# Patient Record
Sex: Male | Born: 1970 | Race: White | Hispanic: No | Marital: Single
Health system: Southern US, Community
[De-identification: ages and names within clinical notes are randomized; demographics above are authoritative.]

## PROBLEM LIST (undated history)

## (undated) DIAGNOSIS — G8929 Other chronic pain: Secondary | ICD-10-CM

## (undated) DIAGNOSIS — G43909 Migraine, unspecified, not intractable, without status migrainosus: Secondary | ICD-10-CM

## (undated) DIAGNOSIS — F259 Schizoaffective disorder, unspecified: Secondary | ICD-10-CM

## (undated) DIAGNOSIS — I509 Heart failure, unspecified: Secondary | ICD-10-CM

## (undated) DIAGNOSIS — J45909 Unspecified asthma, uncomplicated: Secondary | ICD-10-CM

## (undated) DIAGNOSIS — F250 Schizoaffective disorder, bipolar type: Secondary | ICD-10-CM

## (undated) DIAGNOSIS — J449 Chronic obstructive pulmonary disease, unspecified: Secondary | ICD-10-CM

## (undated) DIAGNOSIS — M549 Dorsalgia, unspecified: Secondary | ICD-10-CM

## (undated) DIAGNOSIS — M25569 Pain in unspecified knee: Secondary | ICD-10-CM

## (undated) HISTORY — PX: WISDOM TOOTH EXTRACTION: SHX21

## (undated) HISTORY — DX: Heart failure, unspecified: I50.9

## (undated) HISTORY — PX: KNEE SURGERY: SHX244

---

## 1999-05-09 ENCOUNTER — Emergency Department (HOSPITAL_COMMUNITY): Admission: EM | Admit: 1999-05-09 | Discharge: 1999-05-09 | Payer: Self-pay | Admitting: Emergency Medicine

## 1999-05-09 ENCOUNTER — Encounter: Payer: Self-pay | Admitting: Emergency Medicine

## 1999-06-20 ENCOUNTER — Ambulatory Visit (HOSPITAL_COMMUNITY): Admission: RE | Admit: 1999-06-20 | Discharge: 1999-06-20 | Payer: Self-pay | Admitting: Orthopedic Surgery

## 1999-06-20 ENCOUNTER — Encounter: Payer: Self-pay | Admitting: Orthopedic Surgery

## 1999-07-13 ENCOUNTER — Emergency Department (HOSPITAL_COMMUNITY): Admission: EM | Admit: 1999-07-13 | Discharge: 1999-07-13 | Payer: Self-pay | Admitting: Emergency Medicine

## 1999-08-24 ENCOUNTER — Emergency Department (HOSPITAL_COMMUNITY): Admission: EM | Admit: 1999-08-24 | Discharge: 1999-08-24 | Payer: Self-pay | Admitting: Emergency Medicine

## 1999-08-25 ENCOUNTER — Encounter: Payer: Self-pay | Admitting: Emergency Medicine

## 1999-09-08 ENCOUNTER — Emergency Department (HOSPITAL_COMMUNITY): Admission: EM | Admit: 1999-09-08 | Discharge: 1999-09-08 | Payer: Self-pay | Admitting: Emergency Medicine

## 1999-10-31 ENCOUNTER — Emergency Department (HOSPITAL_COMMUNITY): Admission: EM | Admit: 1999-10-31 | Discharge: 1999-10-31 | Payer: Self-pay | Admitting: Emergency Medicine

## 2000-02-16 ENCOUNTER — Emergency Department (HOSPITAL_COMMUNITY): Admission: EM | Admit: 2000-02-16 | Discharge: 2000-02-16 | Payer: Self-pay | Admitting: Emergency Medicine

## 2000-05-06 ENCOUNTER — Emergency Department (HOSPITAL_COMMUNITY): Admission: EM | Admit: 2000-05-06 | Discharge: 2000-05-06 | Payer: Self-pay | Admitting: Emergency Medicine

## 2000-08-16 ENCOUNTER — Inpatient Hospital Stay (HOSPITAL_COMMUNITY): Admission: EM | Admit: 2000-08-16 | Discharge: 2000-08-17 | Payer: Self-pay | Admitting: Emergency Medicine

## 2000-08-17 ENCOUNTER — Inpatient Hospital Stay (HOSPITAL_COMMUNITY): Admission: EM | Admit: 2000-08-17 | Discharge: 2000-08-24 | Payer: Self-pay | Admitting: Psychiatry

## 2000-11-13 ENCOUNTER — Emergency Department (HOSPITAL_COMMUNITY): Admission: EM | Admit: 2000-11-13 | Discharge: 2000-11-14 | Payer: Self-pay | Admitting: Emergency Medicine

## 2000-12-09 ENCOUNTER — Emergency Department (HOSPITAL_COMMUNITY): Admission: EM | Admit: 2000-12-09 | Discharge: 2000-12-09 | Payer: Self-pay | Admitting: Emergency Medicine

## 2000-12-19 ENCOUNTER — Inpatient Hospital Stay (HOSPITAL_COMMUNITY): Admission: EM | Admit: 2000-12-19 | Discharge: 2000-12-20 | Payer: Self-pay | Admitting: Emergency Medicine

## 2000-12-21 ENCOUNTER — Inpatient Hospital Stay (HOSPITAL_COMMUNITY): Admission: EM | Admit: 2000-12-21 | Discharge: 2000-12-23 | Payer: Self-pay | Admitting: *Deleted

## 2001-03-06 ENCOUNTER — Emergency Department (HOSPITAL_COMMUNITY): Admission: EM | Admit: 2001-03-06 | Discharge: 2001-03-06 | Payer: Self-pay | Admitting: Emergency Medicine

## 2001-05-12 ENCOUNTER — Emergency Department (HOSPITAL_COMMUNITY): Admission: EM | Admit: 2001-05-12 | Discharge: 2001-05-12 | Payer: Self-pay | Admitting: Emergency Medicine

## 2002-03-08 ENCOUNTER — Inpatient Hospital Stay (HOSPITAL_COMMUNITY): Admission: EM | Admit: 2002-03-08 | Discharge: 2002-03-14 | Payer: Self-pay | Admitting: Psychiatry

## 2002-03-31 ENCOUNTER — Inpatient Hospital Stay (HOSPITAL_COMMUNITY): Admission: EM | Admit: 2002-03-31 | Discharge: 2002-04-04 | Payer: Self-pay | Admitting: Psychiatry

## 2002-03-31 ENCOUNTER — Inpatient Hospital Stay (HOSPITAL_COMMUNITY): Admission: EM | Admit: 2002-03-31 | Discharge: 2002-03-31 | Payer: Self-pay

## 2002-03-31 ENCOUNTER — Encounter: Payer: Self-pay | Admitting: *Deleted

## 2002-04-09 ENCOUNTER — Emergency Department (HOSPITAL_COMMUNITY): Admission: EM | Admit: 2002-04-09 | Discharge: 2002-04-10 | Payer: Self-pay

## 2002-04-22 ENCOUNTER — Ambulatory Visit (HOSPITAL_BASED_OUTPATIENT_CLINIC_OR_DEPARTMENT_OTHER): Admission: RE | Admit: 2002-04-22 | Discharge: 2002-04-22 | Payer: Self-pay | Admitting: Orthopedic Surgery

## 2002-04-29 ENCOUNTER — Encounter: Admission: RE | Admit: 2002-04-29 | Discharge: 2002-04-29 | Payer: Self-pay | Admitting: Orthopedic Surgery

## 2002-06-04 ENCOUNTER — Encounter: Payer: Self-pay | Admitting: Emergency Medicine

## 2002-06-04 ENCOUNTER — Emergency Department (HOSPITAL_COMMUNITY): Admission: EM | Admit: 2002-06-04 | Discharge: 2002-06-04 | Payer: Self-pay | Admitting: Emergency Medicine

## 2002-06-28 ENCOUNTER — Emergency Department (HOSPITAL_COMMUNITY): Admission: EM | Admit: 2002-06-28 | Discharge: 2002-06-28 | Payer: Self-pay | Admitting: Emergency Medicine

## 2002-07-10 ENCOUNTER — Emergency Department (HOSPITAL_COMMUNITY): Admission: EM | Admit: 2002-07-10 | Discharge: 2002-07-11 | Payer: Self-pay

## 2002-07-14 ENCOUNTER — Emergency Department (HOSPITAL_COMMUNITY): Admission: EM | Admit: 2002-07-14 | Discharge: 2002-07-14 | Payer: Self-pay | Admitting: Emergency Medicine

## 2002-07-26 ENCOUNTER — Inpatient Hospital Stay (HOSPITAL_COMMUNITY): Admission: EM | Admit: 2002-07-26 | Discharge: 2002-08-02 | Payer: Self-pay | Admitting: Psychiatry

## 2002-10-11 ENCOUNTER — Inpatient Hospital Stay (HOSPITAL_COMMUNITY): Admission: EM | Admit: 2002-10-11 | Discharge: 2002-10-27 | Payer: Self-pay | Admitting: Psychiatry

## 2002-10-19 ENCOUNTER — Encounter (HOSPITAL_COMMUNITY): Payer: Self-pay | Admitting: Psychiatry

## 2002-12-15 ENCOUNTER — Ambulatory Visit (HOSPITAL_BASED_OUTPATIENT_CLINIC_OR_DEPARTMENT_OTHER): Admission: RE | Admit: 2002-12-15 | Discharge: 2002-12-15 | Payer: Self-pay | Admitting: Orthopedic Surgery

## 2003-01-09 ENCOUNTER — Encounter: Admission: RE | Admit: 2003-01-09 | Discharge: 2003-01-09 | Payer: Self-pay | Admitting: *Deleted

## 2003-04-23 ENCOUNTER — Inpatient Hospital Stay (HOSPITAL_COMMUNITY): Admission: AD | Admit: 2003-04-23 | Discharge: 2003-04-28 | Payer: Self-pay | Admitting: Psychiatry

## 2003-05-01 ENCOUNTER — Other Ambulatory Visit (HOSPITAL_COMMUNITY): Admission: RE | Admit: 2003-05-01 | Discharge: 2003-05-03 | Payer: Self-pay | Admitting: Psychiatry

## 2003-10-06 ENCOUNTER — Encounter: Admission: RE | Admit: 2003-10-06 | Discharge: 2003-10-06 | Payer: Self-pay | Admitting: Psychiatry

## 2004-02-22 ENCOUNTER — Inpatient Hospital Stay (HOSPITAL_COMMUNITY): Admission: AD | Admit: 2004-02-22 | Discharge: 2004-02-29 | Payer: Self-pay | Admitting: Psychiatry

## 2004-03-15 ENCOUNTER — Inpatient Hospital Stay (HOSPITAL_COMMUNITY): Admission: RE | Admit: 2004-03-15 | Discharge: 2004-03-25 | Payer: Self-pay | Admitting: Psychiatry

## 2004-06-05 ENCOUNTER — Ambulatory Visit (HOSPITAL_COMMUNITY): Payer: Self-pay | Admitting: Psychiatry

## 2004-09-21 ENCOUNTER — Emergency Department (HOSPITAL_COMMUNITY): Admission: EM | Admit: 2004-09-21 | Discharge: 2004-09-22 | Payer: Self-pay | Admitting: Emergency Medicine

## 2006-06-02 ENCOUNTER — Emergency Department (HOSPITAL_COMMUNITY): Admission: EM | Admit: 2006-06-02 | Discharge: 2006-06-02 | Payer: Self-pay | Admitting: Emergency Medicine

## 2006-06-04 ENCOUNTER — Emergency Department (HOSPITAL_COMMUNITY): Admission: EM | Admit: 2006-06-04 | Discharge: 2006-06-04 | Payer: Self-pay | Admitting: Family Medicine

## 2006-06-04 ENCOUNTER — Emergency Department (HOSPITAL_COMMUNITY): Admission: EM | Admit: 2006-06-04 | Discharge: 2006-06-05 | Payer: Self-pay | Admitting: Emergency Medicine

## 2006-10-25 ENCOUNTER — Emergency Department (HOSPITAL_COMMUNITY): Admission: EM | Admit: 2006-10-25 | Discharge: 2006-10-25 | Payer: Self-pay | Admitting: Emergency Medicine

## 2006-10-30 ENCOUNTER — Inpatient Hospital Stay (HOSPITAL_COMMUNITY): Admission: AD | Admit: 2006-10-30 | Discharge: 2006-11-04 | Payer: Self-pay | Admitting: *Deleted

## 2006-10-30 ENCOUNTER — Ambulatory Visit: Payer: Self-pay | Admitting: *Deleted

## 2007-04-17 ENCOUNTER — Emergency Department (HOSPITAL_COMMUNITY): Admission: EM | Admit: 2007-04-17 | Discharge: 2007-04-17 | Payer: Self-pay | Admitting: Emergency Medicine

## 2008-01-08 ENCOUNTER — Inpatient Hospital Stay (HOSPITAL_COMMUNITY): Admission: EM | Admit: 2008-01-08 | Discharge: 2008-01-10 | Payer: Self-pay | Admitting: Emergency Medicine

## 2008-01-10 ENCOUNTER — Ambulatory Visit: Payer: Self-pay | Admitting: Psychiatry

## 2008-01-17 ENCOUNTER — Inpatient Hospital Stay (HOSPITAL_COMMUNITY): Admission: EM | Admit: 2008-01-17 | Discharge: 2008-01-20 | Payer: Self-pay | Admitting: Emergency Medicine

## 2008-01-20 ENCOUNTER — Encounter (INDEPENDENT_AMBULATORY_CARE_PROVIDER_SITE_OTHER): Payer: Self-pay | Admitting: *Deleted

## 2008-02-09 ENCOUNTER — Encounter: Admission: RE | Admit: 2008-02-09 | Discharge: 2008-02-09 | Payer: Self-pay | Admitting: Gastroenterology

## 2008-04-18 ENCOUNTER — Encounter: Admission: RE | Admit: 2008-04-18 | Discharge: 2008-04-18 | Payer: Self-pay | Admitting: Gastroenterology

## 2008-04-18 ENCOUNTER — Encounter (INDEPENDENT_AMBULATORY_CARE_PROVIDER_SITE_OTHER): Payer: Self-pay | Admitting: *Deleted

## 2008-07-13 ENCOUNTER — Emergency Department (HOSPITAL_COMMUNITY): Admission: EM | Admit: 2008-07-13 | Discharge: 2008-07-13 | Payer: Self-pay | Admitting: Family Medicine

## 2008-12-01 ENCOUNTER — Emergency Department (HOSPITAL_COMMUNITY): Admission: EM | Admit: 2008-12-01 | Discharge: 2008-12-01 | Payer: Self-pay | Admitting: Family Medicine

## 2009-02-24 ENCOUNTER — Emergency Department (HOSPITAL_COMMUNITY): Admission: EM | Admit: 2009-02-24 | Discharge: 2009-02-24 | Payer: Self-pay | Admitting: Emergency Medicine

## 2009-07-12 ENCOUNTER — Encounter (INDEPENDENT_AMBULATORY_CARE_PROVIDER_SITE_OTHER): Payer: Self-pay | Admitting: *Deleted

## 2009-07-13 ENCOUNTER — Ambulatory Visit: Payer: Self-pay | Admitting: Gastroenterology

## 2009-07-13 DIAGNOSIS — R197 Diarrhea, unspecified: Secondary | ICD-10-CM | POA: Insufficient documentation

## 2009-07-13 LAB — CONVERTED CEMR LAB
ALT: 23 units/L (ref 0–53)
Basophils Relative: 0.2 % (ref 0.0–3.0)
CO2: 29 meq/L (ref 19–32)
Calcium: 9.6 mg/dL (ref 8.4–10.5)
Chloride: 104 meq/L (ref 96–112)
Creatinine, Ser: 0.9 mg/dL (ref 0.4–1.5)
Eosinophils Relative: 1.1 % (ref 0.0–5.0)
GFR calc non Af Amer: 99.38 mL/min (ref >60)
Glucose, Bld: 96 mg/dL (ref 70–99)
HCT: 50.1 % (ref 39.0–52.0)
Hemoglobin: 17.3 g/dL — ABNORMAL HIGH (ref 13.0–17.0)
IgA: 210 mg/dL (ref 68–378)
Lymphocytes Relative: 27.5 % (ref 12.0–46.0)
Lymphs Abs: 2.3 10*3/uL (ref 0.7–4.0)
Monocytes Relative: 7.8 % (ref 3.0–12.0)
Neutro Abs: 5.2 10*3/uL (ref 1.4–7.7)
RBC: 5.45 M/uL (ref 4.22–5.81)
RDW: 12.2 % (ref 11.5–14.6)
TSH: 1.06 microintl units/mL (ref 0.35–5.50)
Tissue Transglutaminase Ab, IgA: 0.5 units (ref <7)
Total Bilirubin: 1.1 mg/dL (ref 0.3–1.2)
WBC: 8.2 10*3/uL (ref 4.5–10.5)

## 2009-08-14 ENCOUNTER — Emergency Department (HOSPITAL_COMMUNITY): Admission: EM | Admit: 2009-08-14 | Discharge: 2009-08-14 | Payer: Self-pay | Admitting: Emergency Medicine

## 2009-11-16 ENCOUNTER — Telehealth: Payer: Self-pay | Admitting: Gastroenterology

## 2009-12-12 ENCOUNTER — Ambulatory Visit: Payer: Self-pay | Admitting: Gastroenterology

## 2009-12-12 ENCOUNTER — Encounter (INDEPENDENT_AMBULATORY_CARE_PROVIDER_SITE_OTHER): Payer: Self-pay | Admitting: *Deleted

## 2009-12-31 ENCOUNTER — Encounter: Admission: RE | Admit: 2009-12-31 | Discharge: 2009-12-31 | Payer: Self-pay | Admitting: Family Medicine

## 2010-01-10 ENCOUNTER — Ambulatory Visit (HOSPITAL_COMMUNITY): Admission: RE | Admit: 2010-01-10 | Discharge: 2010-01-10 | Payer: Self-pay | Admitting: Gastroenterology

## 2010-01-10 ENCOUNTER — Ambulatory Visit: Payer: Self-pay | Admitting: Gastroenterology

## 2010-01-14 ENCOUNTER — Encounter (INDEPENDENT_AMBULATORY_CARE_PROVIDER_SITE_OTHER): Payer: Self-pay | Admitting: *Deleted

## 2010-01-14 ENCOUNTER — Telehealth: Payer: Self-pay | Admitting: Gastroenterology

## 2010-01-15 ENCOUNTER — Encounter: Payer: Self-pay | Admitting: Gastroenterology

## 2010-04-24 ENCOUNTER — Encounter: Admission: RE | Admit: 2010-04-24 | Discharge: 2010-05-15 | Payer: Self-pay | Admitting: Neurosurgery

## 2010-05-29 ENCOUNTER — Encounter
Admission: RE | Admit: 2010-05-29 | Discharge: 2010-05-30 | Payer: Self-pay | Admitting: Physical Medicine & Rehabilitation

## 2010-06-03 ENCOUNTER — Encounter
Admission: RE | Admit: 2010-06-03 | Discharge: 2010-06-03 | Payer: Self-pay | Source: Home / Self Care | Attending: Physical Medicine & Rehabilitation | Admitting: Physical Medicine & Rehabilitation

## 2010-06-03 ENCOUNTER — Ambulatory Visit: Payer: Self-pay | Admitting: Physical Medicine & Rehabilitation

## 2010-06-24 ENCOUNTER — Emergency Department (HOSPITAL_COMMUNITY): Admission: EM | Admit: 2010-06-24 | Discharge: 2010-06-25 | Payer: Self-pay | Admitting: Emergency Medicine

## 2010-07-09 DEATH — deceased

## 2010-07-14 ENCOUNTER — Emergency Department (HOSPITAL_COMMUNITY): Admission: EM | Admit: 2010-07-14 | Discharge: 2010-07-14 | Payer: Self-pay | Admitting: Emergency Medicine

## 2010-07-15 ENCOUNTER — Observation Stay (HOSPITAL_COMMUNITY): Admission: EM | Admit: 2010-07-15 | Discharge: 2010-07-15 | Payer: Self-pay | Admitting: Emergency Medicine

## 2010-07-16 ENCOUNTER — Inpatient Hospital Stay (HOSPITAL_COMMUNITY): Admission: EM | Admit: 2010-07-16 | Discharge: 2010-07-19 | Payer: Self-pay | Admitting: Emergency Medicine

## 2010-08-04 ENCOUNTER — Emergency Department (HOSPITAL_COMMUNITY): Admission: EM | Admit: 2010-08-04 | Discharge: 2010-08-04 | Payer: Self-pay | Admitting: Emergency Medicine

## 2010-08-15 ENCOUNTER — Emergency Department (HOSPITAL_BASED_OUTPATIENT_CLINIC_OR_DEPARTMENT_OTHER): Admission: EM | Admit: 2010-08-15 | Discharge: 2010-06-25 | Payer: Self-pay | Admitting: Emergency Medicine

## 2010-09-13 ENCOUNTER — Emergency Department (HOSPITAL_COMMUNITY)
Admission: EM | Admit: 2010-09-13 | Discharge: 2010-09-13 | Payer: Self-pay | Source: Home / Self Care | Admitting: Emergency Medicine

## 2010-10-10 NOTE — Procedures (Signed)
Summary: Instructions for procedure/[facility] (out pt)  Instructions for procedure/[facility] (out pt)   Imported By: Sherian Rein 12/24/2009 07:19:01  _____________________________________________________________________  External Attachment:    Type:   Image     Comment:   External Document

## 2010-10-10 NOTE — Procedures (Signed)
Summary: Colonoscopy  Patient: [name] Note: All result statuses are Final unless otherwise noted.  Tests: (1) Colonoscopy (COL)   COL Colonoscopy           DONE     [facility]     [address] [place], [place]  [address]           COLONOSCOPY PROCEDURE REPORT           PATIENT:  [name], [name]  MR#:  [id number]     BIRTHDATE:  05/02/1971, 38 yrs. old  GENDER:  male     ENDOSCOPIST:  Rachael Fee, MD     PROCEDURE DATE:  01/10/2010     PROCEDURE:  Colonoscopy with biopsy     ASA CLASS:  Class II     INDICATIONS:  intermittent diarrhea     MEDICATIONS:   MAC sedation, administered by CRNA           DESCRIPTION OF PROCEDURE:   After the risks benefits and     alternatives of the procedure were thoroughly explained, informed     consent was obtained.  Digital rectal exam was performed and     revealed no rectal masses.   The  endoscope was introduced through     the anus and advanced to the terminal ileum which was intubated     for a short distance, without limitations.  The quality of the     prep was good, using MoviPrep.  The instrument was then slowly     withdrawn as the colon was fully examined.     <<PROCEDUREIMAGES>>     FINDINGS:  The terminal ileum appeared normal (see image2).  A     normal appearing cecum, ileocecal valve, and appendiceal orifice     were identified. The ascending, hepatic flexure, transverse,     splenic flexure, descending, sigmoid colon, and rectum appeared     unremarkable. Random biopsies were taken from colon and sent to     pathology (jar 1) (see image1 and image3).   Retroflexed views in     the rectum revealed no abnormalities.    The scope was then     withdrawn from the patient and the procedure completed.           COMPLICATIONS:  None           ENDOSCOPIC IMPRESSION:     1) Normal terminal ileum     2) Normal colon, randomly biopsied to check for microscopic     colitis           RECOMMENDATIONS:     Await  pathology for final recommendations.           ______________________________     Rachael Fee, MD           cc: Benedetto Goad, MD           n.     eSIGNED:   Rachael Fee at 01/10/2010 11:32 AM           Phyllip, Claw [name], [phone]  Note: An exclamation mark (!) indicates a result that was not dispersed into the flowsheet. Document Creation Date: 01/10/2010 11:33 AM _______________________________________________________________________  (1) Order result status: Final Collection or observation date-time: 01/10/2010 11:24 Requested date-time:  Receipt date-time:  Reported date-time:  Referring Physician:   Ordering Physician: Rob Bunting [phone]) Specimen Source:  Source: Launa Grill Order Number: [id number] Lab site:

## 2010-10-10 NOTE — Assessment & Plan Note (Signed)
Review of gastrointestinal problems: 1. Chronic diarrhea: extensive stool tests, lab workup including sprue testing, thyroid testing was all normal November 2010.  Drinks excessive alcohol and caffeine, recommended to cut back.    History of Present Illness Visit Type: Follow-up Visit Primary GI MD: Rob Bunting MD Primary Provider: Benedetto Goad, MD Chief Complaint: episodes of diarrhea lasting 3 -4 days History of Present Illness:     40 year old man whom I last saw about 5 months ago at the time that he began to complain of diarrhea. He had extensive stool workup, all of which was negative. He had a lot of blood tests showed CBC was normal, complete metabolic profile is normal, sprue testing was normal thyroid testing was normal.  he thinks he may be getting dirrhea from performing oral sex on his girlfriend.  She "is clean, no odor,  but does not use a douche, she [place]."   he has tried immodium.  Lately he feels fine, no diarrhea.  Today he was constipated.  Dairy is not really an issue for him.  he quit drinking but still drinks 2 liter [facility] daily.            Current Medications (verified): 1)  Alprazolam 1 Mg Tabs (Alprazolam) .... Four Times A Day By Mouth For Anxiety 2)  Fluphenazine Hcl 5 Mg Tabs (Fluphenazine Hcl) .... Take 3 Tablets At Bedtime  Allergies: 1)  ! * Guaifenesin 2)  ! * Dextrmethorphen 3)  ! * Seroquel 4)  ! Nsaids  Vital Signs:  Patient profile:   40 year old male Height:      73 inches Weight:      204.25 pounds BMI:     27.04 Pulse rate:   64 / minute Pulse rhythm:   regular BP sitting:   100 / 68  (left arm) Cuff size:   regular  Vitals Entered By: June McMurray CMA Duncan Dull) (December 12, 2009 2:42 PM)  Physical Exam  Additional Exam:  Constitutional: generally well appearing Psychiatric: alert and oriented times 3 Abdomen: soft, non-tender, non-distended, normal bowel  sounds    Impression & Recommendations:  Problem # 1:  Chronic diarrhea he is convinced that he is getting diarrhea by performing oral sex on his girlfriend. I have never heard of such a transmission round however fecal oral spread of disease is quite common. I recommended that he stop doing this for the next 3-4 months and see if he indeed has no diarrhea. I think it is worthwhile proceed with colonoscopy since he has had this complaint for several months, this would exclude inflammatory bowel disease. We will set this up at his soonest convenience. It should be done at [facility] long with propofol sedation.  Patient Instructions: 1)  You will be scheduled to have a colonoscopy.  This will be at [facility] hospital with propofol sedation. 2)  You should try immodium 1-2 pills with diarrhea stools. 3)  A copy of this information will be sent to Dr. Benedetto Goad. 4)  The medication list was reviewed and reconciled.  All changed / newly prescribed medications were explained.  A complete medication list was provided to the patient / caregiver.  Appended Document: Orders Update/movi    Clinical Lists Changes  Medications: Added new medication of MOVIPREP 100 GM  SOLR (PEG-KCL-NACL-NASULF-NA ASC-C) As per prep instructions. - Signed Rx of MOVIPREP 100 GM  SOLR (PEG-KCL-NACL-NASULF-NA ASC-C) As per prep instructions.;  #  1 x 0;  Signed;  Entered by: Chales Abrahams CMA (AAMA);  Authorized by: Rachael Fee MD;  Method used: [facility] [address] [phone]*, [address], [place], [place], [place]  [address], Ph: [phone] or [phone], Fax: [phone] Orders: Added new Test order of ZCOL (ZCOL) - Signed    Prescriptions: MOVIPREP 100 GM  SOLR (PEG-KCL-NACL-NASULF-NA ASC-C) As per prep instructions.  #1 x 0   Entered by:   Chales Abrahams CMA (AAMA)   Authorized by:   Rachael Fee MD   Signed by:   Chales Abrahams CMA (AAMA) on 12/12/2009   Method used:   [facility]         [facility] [address] [phone]* (retail)       [address]       [place], [place]  [address]       Ph: [phone] or [phone]       Fax: [phone]   RxID:   [id number]

## 2010-10-10 NOTE — Letter (Signed)
Summary: [facility] Instructions  [facility]  [address] [place], [place] [address]   Phone: [phone]  Fax: [phone]       [name]    November 20, 1980    MRN: [id number]        Procedure Day /Date:01/10/10  THURS     Arrival Time:930 am       Procedure Time:1130 am     Location of Procedure:                     X  [facility] ( Outpatient Registration)                        PREPARATION FOR COLONOSCOPY WITH MOVIPREP   Starting 5 days prior to your procedure 01/04/10 do not eat nuts, seeds, popcorn, corn, beans, peas,  salads, or any raw vegetables.  Do not take any fiber supplements (e.g. Metamucil, Citrucel, and Benefiber).  THE DAY BEFORE YOUR PROCEDURE         DATE: 01/09/10  DAY: WED  1.  Drink clear liquids the entire day-NO SOLID FOOD  2.  Do not drink anything colored red or purple.  Avoid juices with pulp.  No orange juice.  3.  Drink at least 64 oz. (8 glasses) of fluid/clear liquids during the day to prevent dehydration and help the prep work efficiently.  CLEAR LIQUIDS INCLUDE: Water Jello Ice Popsicles Tea (sugar ok, no milk/cream) Powdered fruit flavored drinks Coffee (sugar ok, no milk/cream) Gatorade Juice: apple, white grape, white cranberry  Lemonade Clear bullion, consomm, broth Carbonated beverages (any kind) Strained chicken noodle soup Hard Candy                             4.  In the morning, mix first dose of MoviPrep solution:    Empty 1 Pouch A and 1 Pouch B into the disposable container    Add lukewarm drinking water to the top line of the container. Mix to dissolve    Refrigerate (mixed solution should be used within 24 hrs)  5.  Begin drinking the prep at 5:00 p.m. The MoviPrep container is divided by 4 marks.   Every 15 minutes drink the solution down to the next mark (approximately 8 oz) until the full liter is complete.   6.  Follow completed prep with 16 oz of clear liquid of your choice (Nothing red  or purple).  Continue to drink clear liquids until bedtime.  7.  Before going to bed, mix second dose of MoviPrep solution:    Empty 1 Pouch A and 1 Pouch B into the disposable container    Add lukewarm drinking water to the top line of the container. Mix to dissolve    Refrigerate  THE DAY OF YOUR PROCEDURE      DATE: 01/10/10 DAY: THUR  Beginning at 630 a.m. (5 hours before procedure):         1. Every 15 minutes, drink the solution down to the next mark (approx 8 oz) until the full liter is complete.  2. Follow completed prep with 16 oz. of clear liquid of your choice.    3.Nothing to eat or drink after midnight except prep.   MEDICATION INSTRUCTIONS  Unless otherwise instructed, you should take regular prescription medications with a small sip of water   as early as possible the morning of your procedure.  OTHER INSTRUCTIONS  You will need a responsible adult at least 42 years of age to accompany you and drive you home.   This person must remain in the waiting room during your procedure.  Wear loose fitting clothing that is easily removed.  Leave jewelry and other valuables at home.  However, you may wish to bring a book to read or  an iPod/MP3 player to listen to music as you wait for your procedure to start.  Remove all body piercing jewelry and leave at home.  Total time from sign-in until discharge is approximately 2-3 hours.  You should go home directly after your procedure and rest.  You can resume normal activities the  day after your procedure.  The day of your procedure you should not:   Drive   Make legal decisions   Operate machinery   Drink alcohol   Return to work  You will receive specific instructions about eating, activities and medications before you leave.    The above instructions have been reviewed and explained to me by   _______________________    I fully understand and can verbalize these instructions  _____________________________ Date _________

## 2010-10-10 NOTE — Letter (Signed)
Summary: Return to Work  [facility]  [address] [place], [place] [address]   Phone: [phone]  Fax: [phone]    01/14/2010  TO: WHOM IT MAY CONCERN  RE: [name] [address] [place]   The above named individual was under my medical care: Jan 10, 2010 for a Colonoscopy procedure.    If you have any further questions or need additional information, please call.     Sincerely,  Dr Rob Bunting  typed by: Chales Abrahams CMA (AAMA)

## 2010-10-10 NOTE — Progress Notes (Signed)
Summary: triage  Phone Note Call from Patient Call back at [phone]   Caller: Patient Call For: Dr. Christella Hartigan Reason for Call: Talk to Nurse Summary of Call: pt says he was in to see Dr. Christella Hartigan 2-3 months ago for diarrhea and Dr. Christella Hartigan advised that pt have a COL... pt said he declined stating that he didnt want to have a COL, it was necessary... pt then saw his primary care who told him that a COL would not be helpful in determining the cause of his diarrhea and that he just needed to take some probiotics... pt took [facility] OTC for about a month which stopped the diarrhea... now pt says the diarrhea has returned and he has had it for 2-3 weeks, no blood... pt still states that he "doesnt think a COL is in order, that's a little ridiculous" but wants Dr. Christella Hartigan' help Initial call taken by: Vallarie Mare,  November 16, 2009 2:56 PM  Follow-up for Phone Call        set him up for rov, my next available, do not double book for this.  in meantime he can try OTC immodium.  Follow-up by: Rachael Fee MD,  November 16, 2009 10:14 PM  Additional Follow-up for Phone Call Additional follow up Details #1::        left message on machine to call back Chales Abrahams CMA Duncan Dull)  November 19, 2009 8:19 AM   Pt returned call and has been scheduled for 12/12/09. Additional Follow-up by: Chales Abrahams CMA Duncan Dull),  November 19, 2009 11:46 AM

## 2010-10-10 NOTE — Progress Notes (Signed)
Summary: needs note fax over to [facility]  Phone Note Call from Patient Call back at Home Phone [phone]   Caller: Patient Call For: Christella Hartigan Reason for Call: Talk to Nurse Summary of Call: Patient needs a confirmation note faxed over to [facility] confirming that he was here on May 5 and had a colon done.  Please fax it to [phone] att Rosey Bath please make sure it says confirmation on it . Initial call taken by: Tawni Levy,  Jan 14, 2010 1:33 PM  Follow-up for Phone Call        letter faxed to medicaid Follow-up by: Chales Abrahams CMA Duncan Dull),  Jan 14, 2010 1:58 PM

## 2010-10-10 NOTE — Letter (Signed)
Summary: Results Letter  [facility]  [address] [place], [place] [address]   Phone: [phone]  Fax: [phone]        Jan 15, 2010 MRN: [id number]    [place] [address] [place], [place]  [address]    Dear Mr. Hodgens,   The biopsies taken during your recent colonoscopy were all completely normal.  You should continue to follow the recommnedations that we discussed at the time of your procedure and during your office visit.  Please feel free to call if you have any further questions or concerns.       Sincerely,  Rachael Fee MD  This letter has been electronically signed by your physician.  Appended Document: Results Letter letter mailed

## 2010-11-13 ENCOUNTER — Emergency Department (HOSPITAL_COMMUNITY)
Admission: EM | Admit: 2010-11-13 | Discharge: 2010-11-13 | Disposition: A | Discharge status: Home / Self Care | Payer: No Typology Code available for payment source | Attending: Emergency Medicine | Admitting: Emergency Medicine

## 2010-11-13 ENCOUNTER — Emergency Department (HOSPITAL_COMMUNITY): Payer: No Typology Code available for payment source

## 2010-11-13 DIAGNOSIS — M545 Low back pain, unspecified: Secondary | ICD-10-CM | POA: Insufficient documentation

## 2010-11-13 DIAGNOSIS — F17200 Nicotine dependence, unspecified, uncomplicated: Secondary | ICD-10-CM | POA: Insufficient documentation

## 2010-11-13 DIAGNOSIS — J45909 Unspecified asthma, uncomplicated: Secondary | ICD-10-CM | POA: Insufficient documentation

## 2010-11-20 LAB — BASIC METABOLIC PANEL
BUN: 6 mg/dL (ref 6–23)
BUN: 7 mg/dL (ref 6–23)
CO2: 23 mEq/L (ref 19–32)
CO2: 25 mEq/L (ref 19–32)
Calcium: 9.1 mg/dL (ref 8.4–10.5)
Calcium: 9.2 mg/dL (ref 8.4–10.5)
Calcium: 9.2 mg/dL (ref 8.4–10.5)
Calcium: 9.6 mg/dL (ref 8.4–10.5)
Chloride: 105 mEq/L (ref 96–112)
Creatinine, Ser: 0.87 mg/dL (ref 0.4–1.5)
Creatinine, Ser: 0.97 mg/dL (ref 0.4–1.5)
GFR calc Af Amer: >60 mL/min (ref >60)
GFR calc Af Amer: >60 mL/min (ref >60)
GFR calc non Af Amer: >60 mL/min (ref >60)
GFR calc non Af Amer: >60 mL/min (ref >60)
GFR calc non Af Amer: >60 mL/min (ref >60)
GFR calc non Af Amer: >60 mL/min (ref >60)
GFR calc non Af Amer: >60 mL/min (ref >60)
Glucose, Bld: 116 mg/dL — ABNORMAL HIGH (ref 70–99)
Glucose, Bld: 132 mg/dL — ABNORMAL HIGH (ref 70–99)
Glucose, Bld: 84 mg/dL (ref 70–99)
Glucose, Bld: 90 mg/dL (ref 70–99)
Potassium: 3.9 mEq/L (ref 3.5–5.1)
Sodium: 138 mEq/L (ref 135–145)
Sodium: 140 mEq/L (ref 135–145)
Sodium: 140 mEq/L (ref 135–145)
Sodium: 141 mEq/L (ref 135–145)

## 2010-11-20 LAB — CBC
HCT: 42.6 % (ref 39.0–52.0)
HCT: 46.1 % (ref 39.0–52.0)
Hemoglobin: 14.6 g/dL (ref 13.0–17.0)
Hemoglobin: 15 g/dL (ref 13.0–17.0)
Hemoglobin: 15.8 g/dL (ref 13.0–17.0)
Hemoglobin: 15.9 g/dL (ref 13.0–17.0)
MCH: 30.8 pg (ref 26.0–34.0)
MCH: 31.1 pg (ref 26.0–34.0)
MCHC: 33.7 g/dL (ref 30.0–36.0)
MCHC: 33.9 g/dL (ref 30.0–36.0)
MCHC: 34.3 g/dL (ref 30.0–36.0)
MCHC: 34.3 g/dL (ref 30.0–36.0)
Platelets: 173 10*3/uL (ref 150–400)
Platelets: 180 10*3/uL (ref 150–400)
RBC: 4.91 MIL/uL (ref 4.22–5.81)
RBC: 5.16 MIL/uL (ref 4.22–5.81)
RDW: 13 % (ref 11.5–15.5)
RDW: 13 % (ref 11.5–15.5)
WBC: 10.5 10*3/uL (ref 4.0–10.5)
WBC: 13.5 10*3/uL — ABNORMAL HIGH (ref 4.0–10.5)
WBC: 7.5 10*3/uL (ref 4.0–10.5)
WBC: 7.6 10*3/uL (ref 4.0–10.5)

## 2010-11-20 LAB — URINALYSIS, ROUTINE W REFLEX MICROSCOPIC
Glucose, UA: NEGATIVE mg/dL
Hgb urine dipstick: NEGATIVE
Protein, ur: NEGATIVE mg/dL
Specific Gravity, Urine: 1.006 (ref 1.005–1.030)
pH: 6 (ref 5.0–8.0)

## 2010-11-20 LAB — DIFFERENTIAL
Basophils Absolute: 0 10*3/uL (ref 0.0–0.1)
Basophils Relative: 0 % (ref 0–1)
Basophils Relative: 1 % (ref 0–1)
Eosinophils Absolute: 0.1 10*3/uL (ref 0.0–0.7)
Lymphocytes Relative: 18 % (ref 12–46)
Lymphocytes Relative: 37 % (ref 12–46)
Lymphs Abs: 2.5 10*3/uL (ref 0.7–4.0)
Monocytes Absolute: 1.2 10*3/uL — ABNORMAL HIGH (ref 0.1–1.0)
Monocytes Relative: 10 % (ref 3–12)
Monocytes Relative: 11 % (ref 3–12)
Monocytes Relative: 9 % (ref 3–12)
Neutro Abs: 3.8 10*3/uL (ref 1.7–7.7)
Neutro Abs: 9.5 10*3/uL — ABNORMAL HIGH (ref 1.7–7.7)
Neutrophils Relative %: 50 % (ref 43–77)
Neutrophils Relative %: 68 % (ref 43–77)
Neutrophils Relative %: 70 % (ref 43–77)

## 2010-11-20 LAB — CULTURE, BLOOD (ROUTINE X 2)
Culture  Setup Time: 201111070855
Culture  Setup Time: 201111070855
Culture: NO GROWTH

## 2010-11-20 LAB — WOUND CULTURE

## 2010-12-06 ENCOUNTER — Emergency Department (HOSPITAL_COMMUNITY)
Admission: EM | Admit: 2010-12-06 | Discharge: 2010-12-08 | Disposition: A | Discharge status: Other Acute Inpatient Hospital | Payer: Medicare Other | Source: Home / Self Care | Attending: Emergency Medicine | Admitting: Emergency Medicine

## 2010-12-06 DIAGNOSIS — F17200 Nicotine dependence, unspecified, uncomplicated: Secondary | ICD-10-CM | POA: Insufficient documentation

## 2010-12-06 DIAGNOSIS — Z8619 Personal history of other infectious and parasitic diseases: Secondary | ICD-10-CM | POA: Insufficient documentation

## 2010-12-06 DIAGNOSIS — Z8614 Personal history of Methicillin resistant Staphylococcus aureus infection: Secondary | ICD-10-CM | POA: Insufficient documentation

## 2010-12-06 DIAGNOSIS — IMO0002 Reserved for concepts with insufficient information to code with codable children: Secondary | ICD-10-CM | POA: Insufficient documentation

## 2010-12-06 DIAGNOSIS — F259 Schizoaffective disorder, unspecified: Secondary | ICD-10-CM | POA: Insufficient documentation

## 2010-12-06 LAB — DIFFERENTIAL
Lymphocytes Relative: 25 % (ref 12–46)
Monocytes Absolute: 1.2 10*3/uL — ABNORMAL HIGH (ref 0.1–1.0)
Monocytes Relative: 9 % (ref 3–12)
Neutro Abs: 8.6 10*3/uL — ABNORMAL HIGH (ref 1.7–7.7)

## 2010-12-06 LAB — CBC
HCT: 47.6 % (ref 39.0–52.0)
Hemoglobin: 16.1 g/dL (ref 13.0–17.0)
MCH: 30.6 pg (ref 26.0–34.0)
MCHC: 33.8 g/dL (ref 30.0–36.0)

## 2010-12-06 LAB — RAPID URINE DRUG SCREEN, HOSP PERFORMED
Amphetamines: NOT DETECTED
Opiates: NOT DETECTED
Tetrahydrocannabinol: NOT DETECTED

## 2010-12-06 LAB — BASIC METABOLIC PANEL
CO2: 27 mEq/L (ref 19–32)
Calcium: 9.7 mg/dL (ref 8.4–10.5)
Creatinine, Ser: 0.8 mg/dL (ref 0.4–1.5)
Glucose, Bld: 106 mg/dL — ABNORMAL HIGH (ref 70–99)

## 2010-12-06 LAB — URINALYSIS, ROUTINE W REFLEX MICROSCOPIC
Bilirubin Urine: NEGATIVE
Hgb urine dipstick: NEGATIVE
Protein, ur: NEGATIVE mg/dL
Specific Gravity, Urine: 1.008 (ref 1.005–1.030)
Urobilinogen, UA: 0.2 mg/dL (ref 0.0–1.0)

## 2010-12-07 DIAGNOSIS — F259 Schizoaffective disorder, unspecified: Secondary | ICD-10-CM

## 2010-12-08 ENCOUNTER — Inpatient Hospital Stay (HOSPITAL_COMMUNITY)
Admission: AD | Admit: 2010-12-08 | Discharge: 2010-12-16 | DRG: 885 | Disposition: A | Discharge status: Home / Self Care | Payer: Medicare Other | Source: Ambulatory Visit | Attending: Psychiatry | Admitting: Psychiatry

## 2010-12-08 DIAGNOSIS — F259 Schizoaffective disorder, unspecified: Principal | ICD-10-CM

## 2010-12-08 DIAGNOSIS — F3110 Bipolar disorder, current episode manic without psychotic features, unspecified: Secondary | ICD-10-CM

## 2010-12-08 DIAGNOSIS — Z9119 Patient's noncompliance with other medical treatment and regimen: Secondary | ICD-10-CM

## 2010-12-08 DIAGNOSIS — Z91199 Patient's noncompliance with other medical treatment and regimen due to unspecified reason: Secondary | ICD-10-CM

## 2010-12-08 DIAGNOSIS — F29 Unspecified psychosis not due to a substance or known physiological condition: Secondary | ICD-10-CM

## 2010-12-09 DIAGNOSIS — F259 Schizoaffective disorder, unspecified: Secondary | ICD-10-CM

## 2010-12-09 NOTE — H&P (Signed)
NAME:  [name], [name]                   ACCOUNT NO.:  [id number]  MEDICAL RECORD NO.:  [id number]           PATIENT TYPE:  I  LOCATION:  0406                          FACILITY:  [facility]  PHYSICIAN:  Eulogio Ditch, MD DATE OF BIRTH:  Jan 17, 1971  DATE OF ADMISSION:  12/08/2010 DATE OF DISCHARGE:                      PSYCHIATRIC ADMISSION ASSESSMENT   IDENTIFYING INFORMATION:  This is a 40 year old Caucasian male.  This is a voluntary admission.  HISTORY OF THE PRESENT ILLNESS:  This is one of many admissions for [name] who has a history of schizoaffective disorder, bipolar type, and says he stopped his medications several weeks ago because "they're evil."  He presented with disorganized thinking and a lot of somatic complaints related to various things invading his body and believing that there was something wrong with his blood.  These are typical of his previous presentations.  He was medically screened in the emergency room where he received a psychiatric consult from Dr. Rogers Blocker and referred to our unit for stabilization.  Today he presents as guarded, paranoid, somewhat irritable, and has some pressured speech that is not aggressive.  Insight and judgment significantly impaired.  PAST PSYCHIATRIC HISTORY:  Last Behavioral Health admission was in 2008. He is currently followed as an outpatient by the [facility].  He has a history of delusional thinking of a somatic nature and history of schizoaffective disorder, bipolar type.  SOCIAL HISTORY:  This is a 40 year old Caucasian male and his living arrangements are unclear.  No known history of legal problems.  FAMILY HISTORY:  Not available.  ALCOHOL AND DRUG HISTORY:  No known history of substance abuse.  MEDICAL HISTORY:  Primary care provider is Dr. Benedetto Goad at [facility].  MEDICAL PROBLEMS:  None.  PAST MEDICAL HISTORY: 1. Skin cellulitis/boils. 2. Hepatic steatosis. 3. Gastritis. 4.  Schizoaffective disorder. 5. History of left knee arthroplasty.  CURRENT MEDICATIONS:  Are not clear.  He apparently is prescribed: 1. Alprazolam. 2. Norco. 3. Possibly fluphenazine 15 mg p.o. q.h.s.  DRUG ALLERGIES: 1. CLINDAMYCIN which has caused hives. 2. IBUPROFEN. 3. COUMADIN. 4. NSAIDS. 5. GUAIFENESIN. 6. COGENTIN. 7. POTASSIUM.  PHYSICAL EXAM:  Was done in the emergency room and is noted in the record, 93 kg, 6 feet 2 inches tall.  CBC with mildly elevated WBC at 13.3, hemoglobin 16.1.  Chemistry normal.  BUN 8, creatinine 0.80, random glucose was 106.  Urine drug screen negative for all substances. Urinalysis is normal.  MENTAL STATUS EXAM:  Fully alert male, mildly agitated, irritable, guarded, some pressured speech, oriented to person, place, and situation.  Mood and affect are irritable but he is generally directable.  Cognitively, he is intact and oriented x3.  AXIS I:  Schizoaffective disorder, bipolar type, chronic, acute exacerbation, hypomanic. AXIS II:  Deferred. AXIS III:  No diagnosis. AXIS IV:  Deferred. AXIS V:  Current is 30, past year not known.  PLAN:  Voluntarily admit him with a goal of alleviating his psychosis. We have placed him on Geodon 80 mg p.o. b.i.d. and Depakote 500 mg b.i.d. which he has been refusing and we will contact  his ACT Team and attempt to get some additional information about his recent treatment plan and progress.     Margaret A. Lorin Picket, N.P.   ______________________________ Eulogio Ditch, MD    MAS/MEDQ  D:  12/09/2010  T:  12/09/2010  Job:  [id number]  Electronically Signed by Kari Baars N.P. on 12/09/2010 01:22:54 PM Electronically Signed by Eulogio Ditch  on 12/09/2010 02:45:32 PM
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
## 2011-01-02 ENCOUNTER — Emergency Department (HOSPITAL_COMMUNITY): Payer: Medicare Other

## 2011-01-02 ENCOUNTER — Inpatient Hospital Stay (HOSPITAL_COMMUNITY)
Admission: EM | Admit: 2011-01-02 | Discharge: 2011-01-10 | DRG: 917 | Disposition: A | Discharge status: Other Acute Inpatient Hospital | Payer: Medicare Other | Attending: Family Medicine | Admitting: Family Medicine

## 2011-01-02 DIAGNOSIS — T424X4A Poisoning by benzodiazepines, undetermined, initial encounter: Principal | ICD-10-CM | POA: Diagnosis present

## 2011-01-02 DIAGNOSIS — E872 Acidosis, unspecified: Secondary | ICD-10-CM | POA: Diagnosis present

## 2011-01-02 DIAGNOSIS — J9600 Acute respiratory failure, unspecified whether with hypoxia or hypercapnia: Secondary | ICD-10-CM

## 2011-01-02 DIAGNOSIS — T43502A Poisoning by unspecified antipsychotics and neuroleptics, intentional self-harm, initial encounter: Secondary | ICD-10-CM | POA: Diagnosis present

## 2011-01-02 DIAGNOSIS — R45851 Suicidal ideations: Secondary | ICD-10-CM

## 2011-01-02 DIAGNOSIS — Z9119 Patient's noncompliance with other medical treatment and regimen: Secondary | ICD-10-CM

## 2011-01-02 DIAGNOSIS — M6282 Rhabdomyolysis: Secondary | ICD-10-CM | POA: Diagnosis present

## 2011-01-02 DIAGNOSIS — R4020 Unspecified coma: Secondary | ICD-10-CM

## 2011-01-02 DIAGNOSIS — F200 Paranoid schizophrenia: Secondary | ICD-10-CM | POA: Diagnosis present

## 2011-01-02 DIAGNOSIS — J690 Pneumonitis due to inhalation of food and vomit: Secondary | ICD-10-CM | POA: Diagnosis present

## 2011-01-02 DIAGNOSIS — Z91199 Patient's noncompliance with other medical treatment and regimen due to unspecified reason: Secondary | ICD-10-CM

## 2011-01-02 DIAGNOSIS — T438X2A Poisoning by other psychotropic drugs, intentional self-harm, initial encounter: Secondary | ICD-10-CM | POA: Diagnosis present

## 2011-01-02 DIAGNOSIS — T43591A Poisoning by other antipsychotics and neuroleptics, accidental (unintentional), initial encounter: Secondary | ICD-10-CM

## 2011-01-02 DIAGNOSIS — R4182 Altered mental status, unspecified: Secondary | ICD-10-CM | POA: Diagnosis present

## 2011-01-02 LAB — CK TOTAL AND CKMB (NOT AT ARMC)
CK, MB: 20.8 ng/mL (ref 0.3–4.0)
Relative Index: 2.6 — ABNORMAL HIGH (ref 0.0–2.5)

## 2011-01-02 LAB — CBC
HCT: 47.5 % (ref 39.0–52.0)
MCH: 30.9 pg (ref 26.0–34.0)
MCV: 92.2 fL (ref 78.0–100.0)
Platelets: 190 10*3/uL (ref 150–400)
RBC: 5.15 MIL/uL (ref 4.22–5.81)
RDW: 13.7 % (ref 11.5–15.5)

## 2011-01-02 LAB — POCT I-STAT 3, ART BLOOD GAS (G3+)
Acid-Base Excess: 1 mmol/L (ref 0.0–2.0)
Acid-base deficit: 1 mmol/L (ref 0.0–2.0)
Bicarbonate: 27.6 mEq/L — ABNORMAL HIGH (ref 20.0–24.0)
O2 Saturation: 95 %
O2 Saturation: 97 %
TCO2: 28 mmol/L (ref 0–100)
pCO2 arterial: 43.7 mmHg (ref 35.0–45.0)
pO2, Arterial: 90 mmHg (ref 80.0–100.0)

## 2011-01-02 LAB — URINALYSIS, ROUTINE W REFLEX MICROSCOPIC
Bilirubin Urine: NEGATIVE
Hgb urine dipstick: NEGATIVE
Ketones, ur: NEGATIVE mg/dL
Nitrite: NEGATIVE
Specific Gravity, Urine: 1.042 — ABNORMAL HIGH (ref 1.005–1.030)
pH: 5.5 (ref 5.0–8.0)

## 2011-01-02 LAB — COMPREHENSIVE METABOLIC PANEL
ALT: 45 U/L (ref 0–53)
AST: 34 U/L (ref 0–37)
Alkaline Phosphatase: 52 U/L (ref 39–117)
CO2: 28 mEq/L (ref 19–32)
Calcium: 9.4 mg/dL (ref 8.4–10.5)
Chloride: 104 mEq/L (ref 96–112)
GFR calc Af Amer: >60 mL/min (ref >60)
GFR calc non Af Amer: >60 mL/min (ref >60)
Glucose, Bld: 101 mg/dL — ABNORMAL HIGH (ref 70–99)
Potassium: 3.8 mEq/L (ref 3.5–5.1)
Sodium: 140 mEq/L (ref 135–145)
Total Bilirubin: 0.9 mg/dL (ref 0.3–1.2)

## 2011-01-02 LAB — URINE MICROSCOPIC-ADD ON

## 2011-01-02 LAB — ETHANOL: Alcohol, Ethyl (B): <5 mg/dL (ref 0–10)

## 2011-01-02 LAB — RAPID URINE DRUG SCREEN, HOSP PERFORMED
Barbiturates: NOT DETECTED
Cocaine: NOT DETECTED
Opiates: POSITIVE — AB

## 2011-01-02 LAB — DIFFERENTIAL
Basophils Absolute: 0 10*3/uL (ref 0.0–0.1)
Basophils Relative: 0 % (ref 0–1)
Eosinophils Relative: 0 % (ref 0–5)
Lymphocytes Relative: 18 % (ref 12–46)

## 2011-01-03 ENCOUNTER — Inpatient Hospital Stay (HOSPITAL_COMMUNITY): Payer: Medicare Other

## 2011-01-03 LAB — LACTIC ACID, PLASMA: Lactic Acid, Venous: 2.4 mmol/L — ABNORMAL HIGH (ref 0.5–2.2)

## 2011-01-03 LAB — PROTIME-INR
INR: 0.93 (ref 0.00–1.49)
Prothrombin Time: 12.7 seconds (ref 11.6–15.2)

## 2011-01-03 LAB — PHOSPHORUS: Phosphorus: 3.3 mg/dL (ref 2.3–4.6)

## 2011-01-03 LAB — MAGNESIUM: Magnesium: 2.3 mg/dL (ref 1.5–2.5)

## 2011-01-03 LAB — PROCALCITONIN: Procalcitonin: 0.34 ng/mL

## 2011-01-03 LAB — BASIC METABOLIC PANEL
BUN: 6 mg/dL (ref 6–23)
CO2: 27 mEq/L (ref 19–32)
Calcium: 8.9 mg/dL (ref 8.4–10.5)
Chloride: 111 mEq/L (ref 96–112)
Creatinine, Ser: 0.87 mg/dL (ref 0.4–1.5)
Creatinine, Ser: 0.98 mg/dL (ref 0.4–1.5)
GFR calc Af Amer: >60 mL/min (ref >60)
GFR calc non Af Amer: >60 mL/min (ref >60)
Glucose, Bld: 107 mg/dL — ABNORMAL HIGH (ref 70–99)
Potassium: 3.8 mEq/L (ref 3.5–5.1)
Sodium: 141 mEq/L (ref 135–145)

## 2011-01-03 LAB — CBC
Hemoglobin: 15.6 g/dL (ref 13.0–17.0)
RBC: 5.18 MIL/uL (ref 4.22–5.81)

## 2011-01-03 LAB — GLUCOSE, CAPILLARY
Glucose-Capillary: 88 mg/dL (ref 70–99)
Glucose-Capillary: 105 mg/dL — ABNORMAL HIGH (ref 70–99)

## 2011-01-03 LAB — MRSA PCR SCREENING: MRSA by PCR: POSITIVE — AB

## 2011-01-04 DIAGNOSIS — F259 Schizoaffective disorder, unspecified: Secondary | ICD-10-CM

## 2011-01-04 LAB — CBC
HCT: 42.6 % (ref 39.0–52.0)
MCH: 30.2 pg (ref 26.0–34.0)
MCHC: 33.8 g/dL (ref 30.0–36.0)
MCV: 89.3 fL (ref 78.0–100.0)
Platelets: 175 10*3/uL (ref 150–400)
RDW: 12.8 % (ref 11.5–15.5)
WBC: 9.4 10*3/uL (ref 4.0–10.5)

## 2011-01-04 LAB — BASIC METABOLIC PANEL
BUN: 4 mg/dL — ABNORMAL LOW (ref 6–23)
Creatinine, Ser: 0.93 mg/dL (ref 0.4–1.5)
GFR calc non Af Amer: >60 mL/min (ref >60)
Glucose, Bld: 113 mg/dL — ABNORMAL HIGH (ref 70–99)
Potassium: 3.3 mEq/L — ABNORMAL LOW (ref 3.5–5.1)

## 2011-01-06 DIAGNOSIS — F259 Schizoaffective disorder, unspecified: Secondary | ICD-10-CM

## 2011-01-06 LAB — BASIC METABOLIC PANEL
BUN: 3 mg/dL — ABNORMAL LOW (ref 6–23)
Creatinine, Ser: 0.85 mg/dL (ref 0.4–1.5)
GFR calc non Af Amer: >60 mL/min (ref >60)
Glucose, Bld: 146 mg/dL — ABNORMAL HIGH (ref 70–99)
Potassium: 3.8 mEq/L (ref 3.5–5.1)

## 2011-01-06 LAB — CK: Total CK: 359 U/L — ABNORMAL HIGH (ref 7–232)

## 2011-01-06 NOTE — Discharge Summary (Signed)
NAME:  XXX-Ghuman, [name]               ACCOUNT NO.:  [id number]  MEDICAL RECORD NO.:  [id number]           PATIENT TYPE:  I  LOCATION:  5506                         FACILITY:  [facility]  PHYSICIAN:  Marinda Elk, M.D.DATE OF BIRTH:  [birth year]  DATE OF ADMISSION:  01/02/2011 DATE OF DISCHARGE:  01/08/2011                              DISCHARGE SUMMARY   PRIMARY CARE DOCTOR:  Gloriajean Dell. Andrey Campanile, M.D.  DISCHARGE DIAGNOSES: 1. Intentional benzodiazepine overdose. 2. Paranoid schizophrenia. 3. Acute respiratory failure secondary to intentional benzodiazepine     overdose. 4. Rhabdomyolysis. 5. Aspiration pneumonia secondary to intentional benzodiazepine     overdose.  DISCHARGE MEDICATION: 1. Augmentin 875 mg p.o. b.i.d. 2. Ambien 10 mg at bedtime. 3. Clonazepam 1 mg daily. 4. Geodon 160 mg every evening.  PROCEDURES PERFORMED: 1. Chest x-ray that showed status post removal and withdrawal of     endotracheal tube and nasogastric tube placement, persistent     bilateral pulmonary opacity and left pleural effusion. 2. Chest x-ray on January 02, 2011 showed tube in good position,     moderate atelectasis at the base, mild atelectasis on the right.  BRIEF ADMITTING HISTORY AND PHYSICAL:  This is a 40 year old man with past medical history of schizoaffective disorders, suicidal attempts several years ago, depression presents to the [facility] ED on January 02, 2011 with overdose of Geodon and Xanax.  He was comatose and required an emergent intubation.  CCM was consulted for further management.  PHYSICAL EXAMINATION:  VITAL SIGNS:  Heart rate of 86, blood pressure 126/81, respiration 18, he was satting 98% on room air. GENERAL:  Now intubated, sleepy but arousable. HEENT:  Pinpoint pupils.  Pupils are equally round and reactive to light. LUNGS:  Clear bilaterally. CARDIOVASCULAR:  Regular rate and rhythm.  No murmurs, rubs, or gallops. ABDOMEN:  Positive bowel sounds, nontender,  nondistended.  Abdomen is soft. EXTREMITIES:  No clubbing, cyanosis, or edema.  LABORATORY DATA:  Labs on admission shows a glucose of 107, lactate of 2.4, CPK of 113.  Acetaminophen less than 10, hemoglobin of 15, white count of 10.4, platelet 169, INR 0.9.  Sodium 40, potassium 3.8, chloride 111, bicarb 24, BUN of 6, creatinine 0.8, calcium of 9.0, mag of 2.5, phosphorus 2.6, total protein 6.8, albumin of 4.0.  AST 34, ALT 45,  total protein of 0.9, alkaline phosphatase 52.  ABG 7.39, 43 and 89.  ASSESSMENT AND PLAN: 1. Intentional overdose on benzodiazepine of altered mental status.     The patient was intubated with status post extubation on 28,     performed by CCM.  He was satting well after this.  He had clear     mentation, but still a little bit agitated, so psych was consulted     for further evaluation. 2. Paranoid schizophrenia.  Psych was consulted.  They recommended     transfer to [facility] mental health.  They considered to continue     Haldol and Geodon.  Currently stable. 3. Acute respiratory failure.  He had to be reintubated secondary to     intentional overdose of benzodiazepine.  He  is status post     extubation, satting 97% on room air. 4. Rhabdomyolysis is probably because he was down for a very long     time.  He was given IV fluids on admission.  His CK trended down,     now has resolved. 5. Aspiration pneumonia.  This probably secondary to #1.  He was     started on Unasyn.  He developed no fever or white count.  He was     changed to Augmentin and he will continue this five more days.  DISPOSITION:  The patient will be transferred to the [facility].  As per psych recommendations.  He will continue his current medications.  Vitals on day of discharge, temperature 99, pulse of 56, blood pressure 156/98.  He was satting 97% on room, breathing 20 times per minute.     Marinda Elk, M.D.     AF/MEDQ  D:  01/05/2011  T:  01/05/2011   Job:  [id number]  Electronically Signed by Marinda Elk M.D. on 01/06/2011 02:37:38 PM

## 2011-01-09 DIAGNOSIS — F259 Schizoaffective disorder, unspecified: Secondary | ICD-10-CM

## 2011-01-11 NOTE — Discharge Summary (Signed)
NAME:  [name], [name]               ACCOUNT NO.:  [id number]  MEDICAL RECORD NO.:  [id number]           PATIENT TYPE:  I  LOCATION:  5506                         FACILITY:  [facility]  PHYSICIAN:  Standley Dakins, MD   DATE OF BIRTH:  03-05-1971  DATE OF ADMISSION:  01/02/2011 DATE OF DISCHARGE:  01/10/2011                        DISCHARGE SUMMARY - REFERRING   ADDENDUM  DISCHARGE DIAGNOSES: 1. Intentional benzodiazepine overdose. 2. Paranoid schizophrenia. 3. Acute respiratory failure secondary to the overdose. 4. Rhabdomyolysis. 5. Aspiration pneumonia secondary to overdose. 6. Suicidal ideation. 7. Medical noncompliance with taking medications. 8. History of hypokalemia - resolved.  HOSPITAL COURSE:  The patient was transferred out of the intensive care unit when stabilized and went to the monitored medical floor where he was followed.  He was transferred to a oral antibiotics to treat his aspiration pneumonia.  He was placed on Augmentin and tolerated that with no difficulties.  He had difficulty with his behaviors and keeping them controlled during the hospital stay.    The staff had a difficult time controlling his mood swings and paranoid behaviors.  He had several confrontations with the staff regarding his telephone and equipment related to the telephone.  Psychiatry followed him during the hospital stay and recommended that he be transferred to a state facility for treatment inpatient.  The patient did not have a bed available at the accepting facility until Jan 10, 2011.  On many occasions, he had pressured speech and had some manic activities and had refused medications as well.    I am recommending that he take his Augmentin for another 3 days for a total of 7 days.  Prior to that he had been on IV antibiotics.  On the day of discharge, the patient had been essentially at his baseline.  He was not agitated and was relatively cooperative. He was alert and  understood that he was being transferred.  His vital signs prior to discharge included a temperature that was 97.8, pulse 92, respirations 16, blood pressure 135/54, and pulse ox 98% on room air. The patient ambulated well.  He had no other concerns or problems to report.    Also of note, the patient had a sodium of 138, potassium of 3.8, and creatinine of 0.85, taken on January 06, 2011.  Magnesium level 2.3.  White blood cell count of 9.4 on January 04, 2011 and a hemoglobin of 14.4 and hematocrit of 42.6 with a platelet count of 175.  His CK had improved to much better levels prior to discharge.  DISCHARGE CONDITION:  Stable.  DIET:  Regular.  ACTIVITY:  Ad lib.  FOLLOWUP:  The patient will follow up with Dr. Andrey Campanile, his primary care provider and also with the psychiatry team.  DISCHARGE MEDICATIONS: 1. Augmentin 875 mg p.o. x3 additional days. 2. Ambien 10 mg at bedtime. 3. Clonazepam 1 mg daily. 4. Geodon 40 mg at 12 o'clock and 160 mg at 11 o'clock.  I spent more than 32 minutes preparing this patient's discharge, reviewing his extensive medical hospitalization and records, and preparing the discharge summary.     Standley Dakins, MD  CJ/MEDQ  D:  01/10/2011  T:  01/10/2011  Job:  [id number]  Electronically Signed by Standley Dakins  on 01/11/2011 05:38:12 PM

## 2011-01-21 NOTE — H&P (Signed)
NAME:  [name], [name]                   ACCOUNT NO.:  [id number]   MEDICAL RECORD NO.:  [id number]          PATIENT TYPE:  EMS   LOCATION:  MAJO                         FACILITY:  [facility]   PHYSICIAN:  Isidor Holts, M.D.  DATE OF BIRTH:  08-Aug-1971   DATE OF ADMISSION:  01/08/2008  DATE OF DISCHARGE:                              HISTORY & PHYSICAL   a.m.   PRIMARY MEDICAL DOCTOR:  Dr. Benedetto Goad, [facility].   CHIEF COMPLAINT:  Abdominal pain and vomiting for 4 days.   HISTORY OF PRESENT ILLNESS:  This is a 40 year old male.  For past  medical history, see below.  According to the patient, since January 05, 2008, he has had right upper quadrant/epigastric pain described as  intermittent, occurring usually between 1 a.m. to 3 a.m. in the early  morning, associated with vomiting.  He states that during each episode,  he vomits about 3-4 times.  He denies coffee-ground emesis.  During the  day, he remains asymptomatic.  Has also been troubled by severe  heartburn for a longer period of time and has utilized over-the-counter  Pepto-Bismol, Maalox and Gas-X to no avail.  He denies fever, denies  diarrhea. He is constipated.  He took his temperature last night and  found it to be 99.4.   PAST MEDICAL HISTORY:  1. Schizophrenia, status post multiple admissions to [facility].  2. Status post multiple admissions for drug overdose.  3. History of right 5th metacarpal fracture, status post ORIF;      hardware was subsequently removed.  4. Status post cystoscopic removal of bladder foreign body, May 10, 2002.  5. Smoking history.  6. GERD.   MEDICATION HISTORY:  1. Zyprexa 10 mg p.o. daily.  2. Geodon, query dosage.  3. Tylenol Extra Strength (500 mg) two pills daily p.r.n.  4. Benadryl 25 mg p.o. p.r.n. one to three times daily.   ALLERGIES:  1. GUAIFENESIN/DEXTROMETHORPHAN.  2. NSAIDS INTOLERANCE.   REVIEW OF SYSTEMS:  As per HPI and  chief complaint, otherwise negative.   SOCIAL HISTORY:  The patient has been widowed since September 10, 2003, is  unemployed, has 1 offspring; however, his mother has custody.  He is a  smoker and has been smoking 1 to 1-1/2 packets of cigarettes per day  since 1986.  He used to drink heavily in his teens, but states for the  past 1 year and 3 months, he has been totally abstinent.  He denies drug  abuse.   FAMILY HISTORY:  Mother has eczema, otherwise alive and well.  Father is  alive and well.  Family history is otherwise noncontributory.   PHYSICAL EXAMINATION:  VITALS:  Temperature 100.0, pulse 88, respiratory  16, BP 108/68 mmHg, pulse oximeter 95% on room air.  GENERAL:  The patient did not appear to be in obvious acute discomfort  at the time of this evaluation, alert, communicative, not short of  breath at rest.  HEENT:  No clinical pallor or jaundice.  No conjunctival  injection.  Throat is clear.  NECK:  Supple.  JVP not seen.  No palpable lymphadenopathy.  No palpable  goiter.  CHEST:  Clinically clear to auscultation, no wheezes or crackles.  CARDIAC:  Heart sounds 1 and 2 heard, normal, regular, no murmurs.  ABDOMEN:  Obese, soft, minimal tenderness in right upper quadrant and  epigastric region, unable to palpate organs.  Bowel sounds are heard.  EXTREMITIES:  Lower extremity examination showed no pitting edema.  Palpable peripheral pulses.  MUSCULOSKELETAL:  Unremarkable.  CENTRAL NERVOUS SYSTEM:  No focal neurologic deficit on gross  examination.   INVESTIGATIONS:  CBC:  WBC 6.9, hemoglobin 15.7, hematocrit 45.4,  platelets 158,000.  Electrolytes:  Sodium 138, potassium 3.1, chloride  104, CO2 26, BUN 8, creatinine 1.07, glucose 143.  Lipase 192, AST 259,  ALT 572, alkaline phosphatase 120, total bilirubin 3.7.   Chest x-ray dated Jan 08, 2008 shows bronchitic changes.   Abdominal ultrasound scan dated Jan 08, 2008 shows mild gallbladder wall  thickening without  evidence of cholelithiasis, pericholecystic fluid or  sonographic Murphy's sign.  This wall thickening may be partly due to  mild contractions or adenomyomatosis.  Mild hepatosplenomegaly with  diffuse fatty infiltration of the liver.  IVC, pancreas and abdominal  aorta not well visualized.   ASSESSMENT AND PLAN:  1. Possible mild acute pancreatitis versus peptic ulcer disease or      gastritis.  We shall institute bowel rest, intravenous fluids,      antiemetics, proton pump inhibitor, obtain abdominal CT scan.  If      abdominal CT is unrevealing, the patient may need      esophagogastroduodenoscopy.   1. Abnormal liver function tests.  This picture is hepatitic.  The      differential diagnostic considerations include fatty      infiltration/steatosis versus viral hepatitis versus medication      induced.  We shall avoid Tylenol and patient's psychotropic      medications which may be hepatotoxic.  Do hepatitis A, B and C      screen.  Await CT scan results.   1. Smoking history.  The patient has been counseled, but declines      Nicoderm CQ patch.   1. History of schizophrenia.  Mood appears stable.  As mentioned      above, we shall hold Zyprexa and Geodon and consult Psychiatry for      alternative treatment.   Further management will depend on clinical course.      Isidor Holts, M.D.  Electronically Signed     CO/MEDQ  D:  01/08/2008  T:  01/08/2008  Job:  [id number]   cc:   Gloriajean Dell. Andrey Campanile, M.D.

## 2011-01-21 NOTE — Discharge Summary (Signed)
NAME:  [name], [name]                   ACCOUNT NO.:  [id number]   MEDICAL RECORD NO.:  [id number]          PATIENT TYPE:  INP   LOCATION:  5157                         FACILITY:  [facility]   PHYSICIAN:  Isidor Holts, M.D.  DATE OF BIRTH:  August 19, 1971   DATE OF ADMISSION:  01/08/2008  DATE OF DISCHARGE:  01/10/2008                               DISCHARGE SUMMARY   PRIMARY MEDICAL DOCTOR:  Gloriajean Dell. Andrey Campanile, M.D., [facility].   PRIMARY PSYCHIATRIST:  Anselm Jungling, MD.   DISCHARGE DIAGNOSES:  1. Acute gastritis.  2. Nonalcoholic hepatic steatosis.  3. Smoking history.  4. Gastroesophageal reflux disease.  5. Schizophrenia.   DISCHARGE MEDICATIONS:  1. Prilosec OTC 20 mg p.o. daily for one week only.  2. Perphenazine 24 mg p.o. nightly (to be arranged by Dr. Electa Sniff).    Note: Zyprexa, Geodon and Tylenol have been discontinued.   PROCEDURES:  1. Abdominal ultrasound scan dated Jan 08, 2008.  This showed mild      gallbladder wall thickening without evidence of cholelithiasis,      pericholecystic fluid or sonographic Murphy's sign, mild      hepatosplenomegaly with diffuse fatty infiltration of the liver,      IVC, pancreas and abdominal aorta not well visualized.  2. Abdominal CT scan dated Jan 08, 2008.  This showed diffuse fatty      infiltration of the liver, mildly enlarged severe portal lymph      nodes likely reactive.   CONSULTATION:  Dr. Geralyn Flash, psychiatrist.   ADMISSION HISTORY:  As in H&P notes of Jan 08, 2008.  However, in brief,  this is a 40 year old male, with known history of schizophrenia,  previous admissions for drug overdose, history of right fifth metacarpal  fracture status post ORIF, hardware subsequently removed, status post  cystoscopic removal of foreign body September 2003, smoking history and  GERD, who presents with abdominal pain and vomiting for four days, and  frequent heartburn.  He was admitted for further  evaluation,  investigation and management.   CLINICAL COURSE:  1. Acute gastritis.  For details of presentation, refer to above      admission history.  The patient was found to have mildly elevated      lipase of 192, as well as abnormal LFTs.  He underwent abdominal      ultrasound scan which showed evidence of fatty infiltration as well      as abdominal CT scan which confirmed the same.  However, no      evidence of acute pancreatitis was found.  He was managed with      bowel rest, intravenous fluids, proton pump inhibitor with      satisfactory clinical response.  By Jan 08, 2008, lipase level had      normalized at 19.  The patient was able to tolerate regular diet      without any deleterious effects whatsoever.   1. Nonalcoholic hepatic steatosis.  As mentioned above, the patient      was found to have abnormal LFTs at time of  presentation with an AST      of 259, ALT 572, alkaline phosphatase 128.  Both abdominal      ultrasound scan and abdominal CT scan confirmed fatty infiltration      of the liver.  Hepatitis B surface antigen was negative, hepatitis      B surface antibody was positive, consistent with immunity to      hepatitis B.  Hepatitis C antibody was negative.  The patient has      been recommended weight loss.   1. Smoking history.  The patient smokes about 1 to 1-1/2 packets of      cigarettes per day.  He has been counseled appropriately but      declined Nicoderm CQ patch during the course of this      hospitalization.   1. History of gastroesophageal reflux disease.  This was readily      addressed with proton pump inhibitor with amelioration of symptoms.      Likely, GERD symptoms were exacerbated by acute gastritis as      mentioned above.  The patient, in the first instance, has been      recommended a seven-day course of Prilosec OTC, however, should      symptoms continue, he may benefit from p.r.n. proton pump      inhibitor.  We shall defer this to  the patient's primary MD.   1. Schizophrenia.  The patient has a known history of schizophrenia      pre-admission, and was on a combination of Zyprexa and Geodon.      Both medications are hepatotoxic.  In view of his abnormal LFTs and      hepatic steatosis, it was felt prudent to discontinue these      medications.  We therefore consulted Dr. Geralyn Flash,      psychiatrist, to review his psychotropic medications and make      recommendations.  He did so on Jan 10, 2008, has recommended      Perphenazine 24 mg p.o. nightly in place of Zyprexa and Geodon, and      has undertaken to arrange to supply the patient with samples of      this medication in the community.   DISPOSITION:  The patient, on Jan 10, 2008, completely asymptomatic, and  keen to go home.  He was discharged accordingly.   DIET:  No restrictions.   ACTIVITY:  No restrictions.   FOLLOW-UP INSTRUCTIONS:  The patient is recommended to follow up with  his primary MD, Dr. Benedetto Goad, routinely per prior scheduled  appointment and also to follow-up at Dr. Geralyn Flash, his primary  psychiatrist, on a date to be arranged.   SPECIAL INSTRUCTIONS:  The patient has been cautioned to avoid Tylenol  in view of his abnormal LFTs.      Isidor Holts, M.D.  Electronically Signed     CO/MEDQ  D:  01/10/2008  T:  01/10/2008  Job:  [id number]   cc:   Gloriajean Dell. Andrey Campanile, M.D.  Anselm Jungling, MD

## 2011-01-21 NOTE — Discharge Summary (Signed)
NAME:  [name], [name]                   ACCOUNT NO.:  [id number]   MEDICAL RECORD NO.:  [id number]          PATIENT TYPE:  INP   LOCATION:  5524                         FACILITY:  [facility]   PHYSICIAN:  Isidor Holts, M.D.  DATE OF BIRTH:  Apr 19, 1971   DATE OF ADMISSION:  01/16/2008  DATE OF DISCHARGE:  01/20/2008                               DISCHARGE SUMMARY   PMD:  Dr. Andrey Campanile at [facility].   PRIMARY PSYCHIATRIST:  Dr. Geralyn Flash.   DISCHARGE DIAGNOSES:  1. Nonspecific right upper quadrant abdominal pain.  2. Nonalcoholic hepatic steatosis.  3. Gastroesophageal reflux disease.  4. smoking history.  5. Schizophrenia.   DISCHARGE MEDICATIONS:  1. Protonix 40 mg p.o. daily.  2. Perphenazine 24 mg p.o. daily.   CONSULTATIONS:  Telephone discussion with Dr. Ewing Schlein, [facility]  gastroenterologist.   ADMISSION HISTORY:  As in H&P notes of Jan 17, 2008 dictated by Dr.  Della Goo.  However, in brief, this is a 40 year old male, with  known history of schizophrenia, previous admissions for drug overdose,  history of right fifth metacarpal fracture, status post ORIF.  Hardware  subsequently removed, status post cystoscopic removal of foreign body  September 2003, smoking history and, status post hospitalization Jan 08, 2008 for acute gastritis.  On that occasion he was confirmed to have a  nonalcoholic steatohepatitis.  He presents with a 1-day history of right  upper abdominal pain and vomiting without hematemesis or diarrhea and  was admitted for further evaluation, investigation, and management.   PROCEDURES:  1. HIDA scan dated Jan 17, 2008.  This showed fine sequential imaging      out to 2 hours of injection shows no uptake in the bile ducts,      gallbladder, or GI tract.  The pattern is consistent with      cholestasis within the liver.  This might be seen with      hepatocellular disease.  2. MRCP dated Jan 20, 2008.  This showed increased caliber of  the      common bile duct.  No anatomic etiology for the dilatation is      identified.  Specifically no evidence for choledocholithiasis or      mass.  There is peripancreatic and portal lymph nodes, fatty      infiltration of the liver.   CLINICAL COURSE:  1. Abdominal pain.  For details of the presentation refer to admission      history above.  The patient was managed with bowel rest,      intravenous fluid hydration, proton pump inhibitor, antiemetics,      with satisfactory clinical response.  Lipase was unimpressive at      57.  HIDA scan showed no evidence of acute cholecystitis, although      it showed intrahepatic cholestasis.  Over the next couple of days,      the patient's symptomatology resolved and we were able to      transition him to a regular diet and by Jan 20, 2008 he was      completely  asymptomatic and keen to be discharged.   1. Abnormal LFTs.  The patient during his last hospitalization, was      found to have abnormal LFTs, and at that time, hepatitis A, B and C      profile were negative although hepatitis B surface antibody was      positive, consistent with immunity to this condition.  Abdominal      imaging studies confirmed hepatic steatosis.  There was some      suggestion of mild splenomegaly.  On presentation on this occasion,      the following LFTs were found.  Total bilirubin 3.2.  Direct      bilirubin 1.9 indirect bilirubin 1.3, alkaline phosphatase 174, AST      277, ALT 569, albumin 7.1.  The concern was whether or not the      patient had some other etiology for abnormal LFTs, in addition to      fatty infiltration.  GI consultation was therefore requested.      However, telephone discussion was carried out on Jan 19, 2008, and      Jan 20, 2008 with Dr. Ewing Schlein, [facility] gastroenterologist who      recommended doing iron, ferritin, TIBC levels, ceruloplasmin, anti-      smooth muscle antibody, antimitochondrial antibody and antinuclear       antibody levels.  These were carried out on Jan 20, 2008, and      initial results showed an iron of 68, TIBC 267, percent      desaturation 25, ferritin 372.  Ceruloplasmin was normal at 47.      MRCP was ordered.  For details of findings, refer to procedure list      above.  I once again discussed with Dr. Ewing Schlein and he has      recommended that the patient follow up with him in one week's time      in his office.  Appointment has been arranged.  Any further      procedures indicated, will be carried out after results of      laboratory studies are reviewed.  I have discussed this with the      patient, and he is agreeable to this plan.   1. Smoking history.  The patient was counseled appropriately.   1. Schizophrenia.  The patient's mood remained stable during the      course of his hospitalization on pre-admission psychotropic      medications.   DISPOSITION:  The patient was on Jan 20, 2008 asymptomatic and  considered clinically stable for discharge.  His LFTs of that date were  as follows alkaline phosphatase 191, AST 78, ALT 261.   DIET:  Regular.   ACTIVITY:  No restrictions.   FOLLOW-UP INSTRUCTIONS:  The patient is follow up with his primary M.D.  Dr. Andrey Campanile, [facility], routinely per prior scheduled  appointment.  He is to follow up with Dr. Maryjane Hurter gastroenterology,  telephone number [phone] on Thursday, Jan 27, 2008 a 12:00 p.m.  An  appointment already been made.  He is also to follow up with Dr.  Electa Sniff, psychiatrist, per prior scheduled appointment.  All this has  been communicated to the patient, who verbalized understanding.      Isidor Holts, M.D.  Electronically Signed     CO/MEDQ  D:  01/20/2008  T:  01/20/2008  Job:  [id number]   cc:   Gloriajean Dell. Andrey Campanile, M.D.  Anselm Jungling, MD  [facility] [place]
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
## 2011-01-21 NOTE — Consult Note (Signed)
NAME:  [name], [name]                   ACCOUNT NO.:  [id number]   MEDICAL RECORD NO.:  [id number]          PATIENT TYPE:  INP   LOCATION:  5157                         FACILITY:  [facility]   PHYSICIAN:  Anselm Jungling, MD  DATE OF BIRTH:  Mar 28, 1971   DATE OF CONSULTATION:  01/10/2008  DATE OF DISCHARGE:                                 CONSULTATION   IDENTIFYING DATA AND REASON FOR REFERRAL:  The patient is a 40 year old  widowed Caucasian male who was admitted due to abdominal pain, nausea  and vomiting.  He has a history of schizophrenia as well.  Psychiatric  consultation is requested to assess mental status and make  recommendations.   HISTORY OF PRESENTING PROBLEMS:  The patient is well-known to me.  I  happened to follow him as an outpatient in the community, through the  [facility].   He was admitted here due to increasing abdominal pain, vomiting, and  nausea which has been increasing over 4 days.  He has subsequently been  found to have elevated liver enzymes, mildly elevated total bilirubin,  hyperlipidemia, and elevated lipase.  His symptoms have subsequently  resolved, and he is no longer in pain.  His physician, Dr. Genia Harold, indicates  that the patient is ready for discharge.   In the meantime, his usual regimen of Geodon and Zyprexa has been held.   The patient has a history of paranoid schizophrenia, and at times has  been severely ill, delusional, and has had numerous inpatient  psychiatric hospitalizations.  Recently however, he has been quite  stable, without episodes of psychosis for many months.   MENTAL STATUS AND OBSERVATIONS:  The patient is a well-nourished,  normally-developed adult male.  He greets me pleasantly.  He is alert,  fully oriented, and his thoughts and speech are normally organized.  There are no active indicators of psychosis, thought disorder or  delusionality.  His insight is good.   We discussed his medication situation.   He indicates that he has done  well on Stelazine in the past.  We agreed to discontinue Geodon and  Zyprexa in favor of a trial of Stelazine 24 mg q.h.s.   [name] is to be discharged today, he will return home.  The [facility] treatment team will continue to follow him as will I.   DIAGNOSTIC IMPRESSION:  AXIS I:  Schizophrenia, chronic paranoid type,  currently in remission.  AXIS II:  Deferred.  AXIS III:  Possible reaction to Geodon and Zyprexa.  AXIS IV:  Stressors severe.  AXIS V:  GAF 60.   RECOMMENDATIONS:  As above.  I will continue to follow him closely on an  outpatient basis.  I will make sure that he sees Dr. Andrey Campanile, his family  practice physician within the next week or so to have repeat laboratory  done to review trends in liver enzymes and lipase abnormalities.      Anselm Jungling, MD  Electronically Signed     SPB/MEDQ  D:  01/10/2008  T:  01/10/2008  Job:  [id number]

## 2011-01-21 NOTE — H&P (Signed)
NAME:  [name], [name]                   ACCOUNT NO.:  [id number]   MEDICAL RECORD NO.:  [id number]          PATIENT TYPE:  INP   LOCATION:  5524                         FACILITY:  [facility]   PHYSICIAN:  Della Goo, M.D. DATE OF BIRTH:  1970-12-13   DATE OF ADMISSION:  01/17/2008  DATE OF DISCHARGE:                              HISTORY & PHYSICAL   PRIMARY CARE PHYSICIAN:  [facility], Dr. Andrey Campanile   CHIEF COMPLAINT:  Abdominal pain.   HISTORY OF PRESENT ILLNESS:  This is a 40 year old male presenting to  the emergency department with complaints of right upper quadrant  abdominal pain and nausea and vomiting since 9 p.m. on the night of  admission.  He denies having any hematemesis.  He denies having any  diarrhea.  The patient also denies having any fevers or chills.  The  patient was last hospitalized May 2 through May 4 with similar symptoms;  at that time, he was diagnosed with nonalcoholic steatosis and  gastritis.  At that time, he did have elevated liver function tests and  an elevated lipase level.  The patient underwent a workup which did  include a CT scan of the abdomen, ultrasound of the abdomen and also had  a hepatitis panel.  The hepatitis panel returned with positive surface  antibody to hepatitis B, but was negative for hepatitis A and C.  His  CAT scan an ultrasound of the abdomen revealed fatty liver changes which  were diagnosed as being nonalcoholic steatosis and the patient had  gallbladder wall thickening at the upper normal limits at that time.  The patient also had adjustment in his medications, which were thought  to possibly be a cause of his elevated liver function tests.  Per  psychiatry, Dr. Electa Sniff, his medications, Geodon and Zyprexa, were  discontinued and the patient was placed on perphenazine.   PAST MEDICAL HISTORY:  Significant for the above, also history of  schizoaffective disorder.   ALLERGIES:  To ROBITUSSIN and NSAIDS.   SOCIAL HISTORY:  The patient is a smoker; he smokes 1 pack per day for  18 years and he is a nondrinker.  He denies any illicit drug usage.   FAMILY HISTORY:  Noncontributory.   PHYSICAL EXAMINATION:  GENERAL:  This is a 40 year old obese male in  discomfort, but in no acute distress.  VITAL SIGNS:  Temperature 977, blood pressure 139/90, heart rate 73,  respirations 16, O2 saturation 98% on room air.  HEENT:  Normocephalic,  atraumatic.  Pupils are equally round and reactive to light.  Extraocular muscles are intact.  There is no scleral icterus.  Oropharynx is clear.  NECK:  Supple.  Full range of motion.  No thyromegaly or adenopathy or  jugular venous distention.  CARDIOVASCULAR:  Regular rate and rhythm.  No murmurs, gallops or rubs.  LUNGS:  Clear to auscultation bilaterally.  ABDOMEN:  Positive bowel sounds.  Soft.  Tender in the right upper  quadrant area.  Positive Murphy sign present.  No hepatosplenomegaly.  No rebound no guarding.  EXTREMITIES:  Without cyanosis, clubbing or  edema.  The patient does  have multiple tattoos on the arms.  NEUROLOGIC:  Examination nonfocal.   LABORATORY STUDIES:  White blood cell count 7.0, hemoglobin 16.3,  hematocrit 47.4, platelets 199,000, neutrophils 72%.  Sodium 132,  potassium 3.4, chloride 97, bicarb 28, BUN 5, creatinine 0.95 and  glucose 112.  Albumin 4.1, total bilirubin 3.2, AST 277, ALT 569, direct  bilirubin 1.9, indirect bilirubin 1.3, alkaline phosphatase 174 and  lipase level 57.   ASSESSMENT:  [age]-year-old male being admitted with:  1. Right upper quadrant abdominal pain.  2. Nausea and vomiting.  3. Elevated liver function tests/hepatic steatosis and non-viral      hepatitis.  4. Mild hypokalemia.  5. Mild hyponatremia.  6. Schizoaffective disorder.   PLAN:  The patient will be admitted for further evaluation of his  elevated liver function tests and his abdominal pain.  The patient is  n.p.o. and IV fluids  have been ordered along with antiemetics and pain  control therapy.  A repeat ultrasound study of the abdomen has been  ordered along with a HIDA scan.  DVT and GI prophylaxis have also been  ordered.      Della Goo, M.D.  Electronically Signed     HJ/MEDQ  D:  01/17/2008  T:  01/17/2008  Job:  [id number]   cc:   Gloriajean Dell. Andrey Campanile, M.D.

## 2011-01-22 ENCOUNTER — Emergency Department (HOSPITAL_COMMUNITY): Payer: Medicare Other

## 2011-01-22 ENCOUNTER — Emergency Department (HOSPITAL_COMMUNITY)
Admission: EM | Admit: 2011-01-22 | Discharge: 2011-01-22 | Discharge status: Against Medical Advice | Payer: Medicare Other | Attending: Emergency Medicine | Admitting: Emergency Medicine

## 2011-01-22 DIAGNOSIS — F17200 Nicotine dependence, unspecified, uncomplicated: Secondary | ICD-10-CM | POA: Insufficient documentation

## 2011-01-22 DIAGNOSIS — J45909 Unspecified asthma, uncomplicated: Secondary | ICD-10-CM | POA: Insufficient documentation

## 2011-01-22 DIAGNOSIS — R0789 Other chest pain: Secondary | ICD-10-CM | POA: Insufficient documentation

## 2011-01-22 DIAGNOSIS — M25529 Pain in unspecified elbow: Secondary | ICD-10-CM | POA: Insufficient documentation

## 2011-01-22 DIAGNOSIS — R4789 Other speech disturbances: Secondary | ICD-10-CM | POA: Insufficient documentation

## 2011-01-22 DIAGNOSIS — M25519 Pain in unspecified shoulder: Secondary | ICD-10-CM | POA: Insufficient documentation

## 2011-01-22 DIAGNOSIS — R4182 Altered mental status, unspecified: Secondary | ICD-10-CM | POA: Insufficient documentation

## 2011-01-22 DIAGNOSIS — Z79899 Other long term (current) drug therapy: Secondary | ICD-10-CM | POA: Insufficient documentation

## 2011-01-22 LAB — URINALYSIS, ROUTINE W REFLEX MICROSCOPIC
Bilirubin Urine: NEGATIVE
Glucose, UA: NEGATIVE mg/dL
Nitrite: NEGATIVE
Specific Gravity, Urine: 1.011 (ref 1.005–1.030)
pH: 6 (ref 5.0–8.0)

## 2011-01-22 LAB — ETHANOL: Alcohol, Ethyl (B): <11 mg/dL — ABNORMAL HIGH (ref 0–10)

## 2011-01-22 LAB — RAPID URINE DRUG SCREEN, HOSP PERFORMED
Amphetamines: NOT DETECTED
Barbiturates: NOT DETECTED
Benzodiazepines: POSITIVE — AB
Cocaine: NOT DETECTED
Opiates: POSITIVE — AB
Tetrahydrocannabinol: NOT DETECTED

## 2011-01-22 LAB — SALICYLATE LEVEL: Salicylate Lvl: <2 mg/dL — ABNORMAL LOW (ref 2.8–20.0)

## 2011-01-24 NOTE — H&P (Signed)
[facility]  Patient:    [name], [name] A Visit Number: [id number] MRN: [id number]          Service Type: PSY Location: 300 [id number] Attending Physician:  Rachael Fee Dictated by:   Candi Leash. Orsini, N.P. Admit Date:  03/08/2002                     Psychiatric Admission Assessment  IDENTIFYING INFORMATION:  This is a 40 year old married white male voluntarily admitted for suicidal ideation.  HISTORY OF PRESENT ILLNESS:  The patient presents with history of depression, suicidal thoughts.  He reports he is stressed over his wifes illness, who is presently in the hospital.  His wife has leukemia.  He has been taking care of his 59-month-old son.  He states he has never spent more than an hour, taking care of him totally, and very stressed over the situation.  He has begun hearing auditory hallucinations, making comments about things that he does and having positive suicidal thoughts to jump off a bridge.  PAST PSYCHIATRIC HISTORY:  Third visit to [facility].  No other hospitalizations.  Sees Dr. Hortencia Pilar at [facility].  Last visit was in June of 2003.  He has a history of overdosing x 2.  SOCIAL HISTORY:  He is a 40 year old married white male, married for three years.  He has an 72-month-old son.  Son is presently with his sister-in-law. He is a delivery driver.  He lives with his wife and son.  He has no legal charges.  His wife is in the hospital at present, at [facility].  FAMILY HISTORY:  None.  ALCOHOL/DRUG HISTORY:  The patient smokes.  He denies any alcohol or substance abuse.  PRIMARY CARE Ladislaus Repsher:  [facility].  MEDICAL PROBLEMS:  None.  MEDICATIONS:  ______ 4 mg at h.s., Prevacid 1 mg q.a.m., BuSpar b.i.d., Zanaflex and Zyprexa.  The patient has been compliant with these medications.  DRUG ALLERGIES:  NSAIDS.  PHYSICAL EXAMINATION:  VITAL SIGNS:  Temperature 97.4, heart rate 66,  respirations 16, blood pressure 110/85.  The patient is 6 feet 1 inch.  He is 208 pounds.  GENERAL APPEARANCE:  The patient is a 40 year old white male in no acute distress.  He is well-nourished and appears his stated age.  He is well-groomed, alert and cooperative.  HEENT:  Head is normocephalic.  He can raise his eyebrows.  Hair is short, brown, clean and evenly distributed.  EOMs are intact bilaterally.  External ear canals are patent.  Hearing is appropriate to conversation.  No sinus tenderness.  No nasal discharge.  Mucosa is moist with fair dentition.  The patient has several teeth missing on his right upper.  NECK:  Supple.  No JVD.  Negative lymphadenopathy.  Thyroid is nonpalpable, nontender.  CHEST:  Clear to auscultation.  No adventitious sounds.  HEART:  Regular rate and rhythm.  Carotid pulses are equal and adequate.  GENITALIA:  Exam is deferred.  ABDOMEN:  Soft, nontender abdomen.  No CVA tenderness.  MUSCULOSKELETAL:  No joint swelling or deformity.  Good range of motion. Muscle strength and tone is equal bilaterally.  No signs of injury.  SKIN:  Warm and dry with good turgor.  Nail beds are pink with good capillary refill.  Strong bilateral radial pulses.  No rashes or lacerations were noted.  NEUROLOGICALLY:  Oriented x 3.  His cranial nerves are grossly intact.  Good grip strength bilaterally.  No involuntary movements.  Cerebellar function intact with heel to shin and normal alternating movements.  MENTAL STATUS EXAMINATION:  He is an alert, oriented, casually dressed male. He is cooperative.  Little eye contact.  Speech is clear, somewhat hyperverbal.  Mood is depressed and anxious.  Affect is anxious.  Thought process:  The patient reporting positive auditory hallucinations.  Does not appear to be responding at this time to hallucinations.  Thoughts were organized and goal directed.  No suicidal or homicidal ideation.  No visual hallucinations.   Cognitive function intact.  Memory is good.  Judgment and insight are fair.  DIAGNOSES: Axis I:    1. Depressive disorder with psychotic features.            2. Rule out schizoaffective disorder.            3. Rule out adjustment disorder. Axis II:   Deferred. Axis III:  None. Axis IV:   Problems with primary support group, wifes illness, other            psychosocial problems. Axis V:    Current 35; this past year 65.  PLAN:  Voluntary admission to [facility] for depression and suicidal ideation.  Positive auditory hallucinations.  Contract for safety. Check every 15 minutes.  Will resume his routine medications.  Will add Seroquel for anxiety and hallucinations.  Will check his labs.  Will attend groups and follow up with [facility].  Increase his coping skills.  TENTATIVE LENGTH OF STAY:  Three to five days. Dictated by:   Candi Leash. Orsini, N.P. Attending Physician:  Rachael Fee DD:  03/10/02 TD:  03/10/02 Job: [id number]

## 2011-01-24 NOTE — Discharge Summary (Signed)
NAME:  [name], [name]                             ACCOUNT NO.:  [id number]   MEDICAL RECORD NO.:  [id number]                   PATIENT TYPE:  IPS   LOCATION:  0405                                 FACILITY:  [facility]   PHYSICIAN:  Geoffery Lyons, M.D.                   DATE OF BIRTH:  Mar 17, 1971   DATE OF ADMISSION:  10/11/2002  DATE OF DISCHARGE:  10/27/2002                                 DISCHARGE SUMMARY   CHIEF COMPLAINT AND PRESENT ILLNESS:  This was one of multiple admissions to  [facility] for this 40 year old white male voluntarily  admitted, married but currently separated.  Presented to the hospital with  paranoia, believing that he was being poisoned by rat poison by nonspecific  products in the grocery.  He was hyperverbal with pressured speech,  disorganized, flight of ideas and clear paranoia.  Had not taken any  medications.  Multiple admissions for similar symptoms.  Mildly agitated at  the time of the interview.  Chronic noncompliance with medications.  Recent  stressors related to the relationship with his wife and the recent  separation.   PAST PSYCHIATRIC HISTORY:  Previous inpatient at [facility].  History of one prior overdose on Geodon in 2001.  History of being  managed with Prolixin Decanoate.  Trials on Risperdal, Clozaril, Zyprexa,  all of which, he claims, gave him a lot of side effects.   ALCOHOL/DRUG HISTORY:  Denies and there is no evidence of substance abuse.   PAST MEDICAL HISTORY:  Right knee pain.  He recently kicked a door with his  right foot.  History of some type of ligament disorder in the left knee and  some history of hypertension.   MEDICATIONS:  He was not taking any medication.  Previously was supposed to  take Zyprexa and Trileptal.   PHYSICAL EXAMINATION:  Performed and failed to show any acute findings.   MENTAL STATUS EXAM:  Young white male appears to be his stated age.  He is  hyperverbal, irritable  with irritable, labile affect.  Able to redirect in  conversation basically only with considerable effort and coaxing.  He is  hyperverbal with pressured speech.  Mood is elevated and irritable.  Thought  processes is agitated and disorganized, revealing paranoia, some auditory  hallucinations.  Appears to be responding to internal stimuli.  Refusing to  believe that he has a illness, that he will respond to medication.  He says  it is because of the rat poison.  He is also experiencing auditory  hallucinations.  Cognition well-preserved.   ADMISSION DIAGNOSES:   AXIS I:  Schizoaffective disorder, bipolar-type, hypomanic.   AXIS II:  Deferred.   AXIS III:  1. History of hypertension.  2. Right knee pain.   AXIS IV:  Moderate.   AXIS V:  Global Assessment of Functioning upon admission  15-20; highest  Global Assessment of Functioning in the last year 89.   LABORATORY DATA:  Within normal limits.   HOSPITAL COURSE:  He was admitted and started intensive individual and group  psychotherapy.  We worked to help him improve his reality testing, which  challenged his delusions and his distortions.  He was placed on Trileptal  150 mg twice a day and Zyprexa Zydis 10 mg at night.  This was increased to  10 mg in the morning and 15 mg at night.  He was sedated during the day, so  Zyprexa was switched to Zyprexa Zydis 20 mg at night.  He was complaining  that Trileptal would slow him down.  He was started on Topamax 25 mg twice a  day and he seemed to tolerate it quite well.  As we continued to optimize  treatment, we continued to increase both medications.  He had agreed that he  was having a difficult time with his wife.  Conflict with his wife is  ongoing.  He decided that he did not want to go back with her.  Still  dealing with the wife's illness.  Initially, mood was very anxious.  Affect  was very anxious.  Speech was pressured.  Thoughts were consistent with  paranoid ideation,  poison in the medications that kills animals, seems that  the animals were being spread around.  Evidencing increased impulsive  behavior and very poor insight.  Continued to evidence the pressured speech,  hypothymic, expansive behavior, refusing to take Trileptal, willing to  continue using the Zyprexa.  He felt that all the medications like Geodon,  Depakote, Neurontin, Trileptal made him feel the same and he was not willing  to try them.  He started tolerating medications well.  He started settling  down.  There was less of the spontaneous delusional material.  We went up on  the Zyprexa and he seemed to tolerate the Zyprexa well.  He continued to  evidence the hyperthymic and pressured speech.  He continued to then be  somatically focused and anxious about some vague symptom that he was  experiencing.  The issue of where to go became prominent as he did not want  to be placed.  He wanted his mother to give him another chance and be able  to come back home but he went to go back with his wife.  Continued to  exhibit some rambling speech.  Started being more contained, less pressured  with less of the delusional content.  There was a lot of anxiety associated  to the placement and the anxiety increased the pressured speech.  On  October 27, 2002, it was assessed that he had obtained full benefit from  the hospitalization.  He was much improved.  The pressure had diminished.  There was no spontaneous delusional content.  No suicidal ideation.  No  homicidal ideation.  Mother was willing to give him another chance.  He was  going to be discharged to stay with his mother.  He stated that he was going  to continue taking his medication.   DISCHARGE DIAGNOSES:   AXIS I:  Schizoaffective disorder, bipolar, hypomanic-type.   AXIS II:  Deferred.   AXIS III:  Hypertension by history.   AXIS IV:  Moderate.  AXIS V:  Global Assessment of Functioning upon discharge 50-55.    DISCHARGE  MEDICATIONS:  1. Zyprexa Zydis 30 mg at bedtime.  2. Topamax 100 mg in the morning and 200 mg  at bedtime.   FOLLOW UP:  [facility].                                               Geoffery Lyons, M.D.    IL/MEDQ  D:  11/30/2002  T:  11/30/2002  Job:  [id number]
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
## 2011-01-24 NOTE — H&P (Signed)
NAME:  Llewellyn, [place] A                             ACCOUNT NO.:  [id number]   MEDICAL RECORD NO.:  [id number]                   PATIENT TYPE:  IPS   LOCATION:  0401                                 FACILITY:  [facility]   PHYSICIAN:  Geoffery Lyons, M.D.                   DATE OF BIRTH:  [birth year]   DATE OF ADMISSION:  04/23/2003  DATE OF DISCHARGE:                         PSYCHIATRIC ADMISSION ASSESSMENT   IDENTIFYING INFORMATION:  This is a 40 year old separated white male  voluntarily admitted on April 23, 2003.   HISTORY OF PRESENT ILLNESS:  The patient presents with a history of auditory  hallucinations.  States that these voices are commenting on what he is  saying.  He thinks one of the voices is his father who is currently in  prison.  He states he went to the emergency department with complaints of  testicular pain and facial pain and states that if he did not get better he  would shoot himself and feels that is what prompted him to be admitted.  He  denies any current suicidal ideation.  He does state he has been drinking  about two 40-ounces 2-3 days per week.  His sleeping has been decreased.  His appetite has been satisfactory.  He reports he has been compliant with  his medication, although he states they are not working.   PAST PSYCHIATRIC HISTORY:  The patient has had several admissions to  [facility].  He was seeing Dr. Ladona Ridgel in the outpatient  department and has history of overdosing one year ago.   SOCIAL HISTORY:  This is a 40 year old separated white male, separated for  eight months.  He has a 33-month-old child.  He lives with his mother.  He  is not working.   FAMILY HISTORY:  Unknown.   ALCOHOL/DRUG HISTORY:  The patient smokes.  Has been drinking about 2-3 days  per week, drinking about 4-6 beers.  His last drink was a few days prior to  this admission.  Denies any drug use.   PRIMARY CARE PHYSICIAN:  Unknown.   MEDICAL PROBLEMS:  Headaches.   MEDICATIONS:  Geodon 20 mg q.d. (has been on that for one month) and Ambien  for sleep.  Has been on Zyprexa in the past.   ALLERGIES:  GUAIFENESIN AND NSAIDS.   PHYSICAL EXAMINATION:  Done at [facility].  The patient appears in no acute  distress but is complaining of pain as if he had the flu.  His vital signs  reveal temperature 98, heart rate 65, respirations 18, blood pressure  113/71.  The patient is a well-nourished male.   LABORATORY DATA:  CBC within normal limits.  Urine drug screen was negative.  Alcohol level less than 5.  BMET within normal limits.   MENTAL STATUS EXAM:  Alert, cooperative male with little eye contact.  Neatly dressed.  Speech is rapid,  cursing often, somewhat tangential.  The  patient is irritable and anxious.  Thought processes are positive auditory  hallucinations.  The patient having multiple somatic complaints but denies  any suicidal or homicidal thoughts.  Cognitive function intact.  Memory is  poor.  Judgment and insight are poor.   DIAGNOSES:   AXIS I:  1. Schizoaffective disorder.  2. Rule out alcohol abuse.   AXIS II:  Deferred.   AXIS III:  Headaches.   AXIS IV:  Problems with primary support group, other psychosocial problems,  occupation.   AXIS V:  Current 25; estimated this past year 55-60.   PLAN:  Voluntary admission for psychosis and depression.  Contract for  safety.  Check every 15 minutes.  The patient to be placed on the 400 Hall  for close monitoring.  Will stabilize mood and thinking so patient can be  safe.  Will increase Geodon for mood lability.  That was discussed with the  patient.  The patient was agreeable at this time.  The patient is to be  medication-compliant.  To follow up with mental health or continue seeing  Dr. Ladona Ridgel in the IOP program.  The patient is to remain alcohol-free.  We  will also add some Librium for withdrawal symptoms.   TENTATIVE LENGTH OF STAY:  Four to six days.     Landry Corporal,  N.P.                       Geoffery Lyons, M.D.    JO/MEDQ  D:  04/27/2003  T:  04/27/2003  Job:  [id number]

## 2011-01-24 NOTE — Discharge Summary (Signed)
NAME:  [name], [name]                             ACCOUNT NO.:  [id number]   MEDICAL RECORD NO.:  [id number]                   PATIENT TYPE:  IPS   LOCATION:  0402                                 FACILITY:  [facility]   PHYSICIAN:  Geoffery Lyons, M.D.                   DATE OF BIRTH:  03-01-1971   DATE OF ADMISSION:  02/22/2004  DATE OF DISCHARGE:  02/29/2004                                 DISCHARGE SUMMARY   CHIEF COMPLAINT AND PRESENT ILLNESS:  This was one of multiple admissions to  [facility] for this 40 year old widowed white male  involuntarily committed.  He had [name] history of intentional overdose on  Zyprexa, taking 10 to 12 pills.  He claimed his intention was to sleep.  He  denied it was [name] suicidal plan to kill himself.  He had not been sleeping.  His wife passed away from cancer in 10-Oct-2022.  He had poor compliance with  medications.  He claimed he had been self-medicating.   PAST PSYCHIATRIC HISTORY:  He had multiple admissions.  More recently, he  saw Kathi Ludwig T. Arfeen, M.D., on an outpatient basis.   SUBSTANCE ABUSE HISTORY:  He had been drinking up to [name] six pack, using  marijuana on occasion.   PAST MEDICAL HISTORY:  Noncontributory.   MEDICATIONS:  1. Zyprexa  2. Geodon 40 mg at bedtime.   PHYSICAL EXAMINATION:  Physical examination was performed, failed to show  any acute findings.   LABORATORY DATA:  Drug screen was negative for substances of abuse.  Blood  chemistries were within normal limits.  CBC was within normal limits.   MENTAL STATUS EXAM:  Mental status exam revealed an alert, cooperative male,  fair eye contact, casually dressed.  Pressured speech, somewhat  circumstantial, at times tangential.  He claimed that he was feeling better  that day but he was distracted, unable to focus and follow through, somewhat  expansive at times, some underlying paranoia.  He endorsed some positive  auditory hallucinations before admission.  Cognitive:  Cognition was well  preserved.   ADMISSION DIAGNOSES:   AXIS I:  Schizoaffective disorder.   AXIS II:  No diagnosis.   AXIS III:  No diagnosis.   AXIS IV:  Moderate.   AXIS V:  Global assessment of functioning upon admission 25, highest global  assessment of functioning in the last year 60.   HOSPITAL COURSE:  He was admitted and started in intensive individual and  group psychotherapy.  He was given Ambien for sleep.  He was given Ativan as  needed for anxiety.  He was placed back on Zyprexa 5 mg at night.  Zyprexa  was increased to 7.5 mg.  He stated that the voices got worse.  He took an  overdose, increased stress after the wife's death.  Mother was trying to get  custody of his son.  He claimed he was compliant with medications but this  was questionable.  He was having difficulty with his job, he was working in  [employer], difficulty with the smells, working 12 hours.  He endorsed  anxiety.  He looked anxious, some pressured speech.  We started working on  the Zyprexa.  He continued to evidence some pressured speech, underlying  paranoia, difficulty with the voices.  He felt that the Seroquel might have  been better for him and he was wanting to switch as the Zyprexa was not  effective.  We switched to Seroquel 50 mg twice [name] day and 100 mg at night.  With the Seroquel, he seemed to be more sedated.  He slept most of the  mornings, waking up in the afternoon.  But, as we continued to adjust the  Seroquel and he kept taking it, the sedation got better.  He continued to  evidence the pressured speech but started getting better.  He required some  Ativan to help with acute anxiety.  He became less sedated with the  Seroquel.  We spoke with the mother and she said he was not welcome to come  back so his employer offered him [name] place to stay.  On June 23, he was in  full contact with reality, much more calm, less pressure, no delusions, no  hallucinations, more focused on tasks  and target, no evidence of  hallucinations, no suicidal or homicidal ideas, felt that he could be safely  discharged so we went ahead and discharged to outpatient followup.   DISCHARGE DIAGNOSES:   AXIS I:  Schizoaffective disorder.   AXIS II:  No diagnosis.   AXIS III:  No diagnosis.   AXIS IV:  Moderate.   AXIS V:  Global assessment of functioning upon discharge 50.   DISCHARGE MEDICATIONS:  Seroquel 25 mg two twice [name] day and 200 mg at  bedtime.   FOLLOW UP:  He was to follow up with Kathi Ludwig T. Lolly Mustache, M.D., at [facility].                                               Geoffery Lyons, M.D.    IL/MEDQ  D:  03/15/2004  T:  03/16/2004  Job:  [id number]

## 2011-01-24 NOTE — Discharge Summary (Signed)
NAME:  [name], [name]                             ACCOUNT NO.:  [id number]   MEDICAL RECORD NO.:  [id number]                   PATIENT TYPE:  IPS   LOCATION:  0401                                 FACILITY:  [facility]   PHYSICIAN:  Geoffery Lyons, M.D.                   DATE OF BIRTH:  1971/05/27   DATE OF ADMISSION:  04/23/2003  DATE OF DISCHARGE:  04/28/2003                                 DISCHARGE SUMMARY   CHIEF COMPLAINT AND PRESENT ILLNESS:  This was one of several admissions to  [facility] for this 40 year old separated white  male voluntarily admitted.  He had auditory hallucinations, voices are  commanding him what to say.  As well, the voice of his father was calling in  prison.  He went to the emergency department with complaints of testicular  pain and facial pain.  He stated that if he did not get better, he would  shoot himself and felt this prompted him to be admitted.  He denied any  current suicidal ideas.   He had been drinking about 240 ounces 2-3 times per week.  Sleeping has been  decreased.  Appetite has been satisfactory.  Has been compliant with  medication.   PAST PSYCHIATRIC HISTORY:  This is the third admission to [facility] inpatient department.   SOCIAL HISTORY:  Smokes.  Drinks about 2-3 days per week, drinking 4-6  beers.   PAST MEDICAL HISTORY:  Headaches.   MEDICATIONS:  1. Geodon 20 mg daily.  2. Ambien for sleep.   PHYSICAL EXAMINATION:  Physical examination was performed and showed no  acute findings.   MENTAL STATUS EXAMINATION:  He was an alert cooperative male.  Good eye  contact.  Neatly dressed.  Speech is rapid, cursing often, somewhat  tangential.  Irritable and anxious.  Denied visual or auditory  hallucinations.  Possible somatic complaints.  No suicidal or homicidal  ideas.  Cognition well preserved.   ADMISSION DIAGNOSES:   AXIS I:  1. Schizoaffective disorder  2. Alcohol  abuse.   AXIS II:  No diagnosis.   AXIS III:  Headaches.   AXIS IV:  Moderate.   AXIS V:  1. GAF on admission: 25.  2. Highest GAF: 55-60.   LABORATORY DATA:  Thyroid profile within normal limits.   HOSPITAL COURSE:  He was admitted and started intensive individual and group  psychotherapy.  He was placed on Geodon 20 mg b.i.d.  He was given some  Zithromax.  Geodon was increased to 40 mg b.i.d.  He did report upon  admission that he was hearing voices.  He was separated from his wife and he  was not planning to go back.  Once the medication was increased and  adjusted, he started sleeping better.  The main issue was that he was not  able to  smoke but he was able to live with that.   On August 20, he was much improved with no active hallucinations, no  delusions, no suicidal or homicidal ideas.  Basically baseline.  He was  willing to come to IOP for further stabilization.   DISCHARGE DIAGNOSES:   AXIS I:  1. Schizoaffective disorder.  2. Alcohol abuse.   AXIS II:  No diagnosis.   AXIS III:  Headache.   AXIS IV:  Moderate.   AXIS V:  GAF on discharge: 55.   DISCHARGE MEDICATIONS:  1. Geodon 40 mg b.i.d.  2. Ambien 10 mg at bedtime for sleep.  3. Ativan 1 mg b.i.d. as needed.  4. Zithromax 250 mg to complete the treatment regime.   FOLLOWUP:  At the mental health IOP.                                               Geoffery Lyons, M.D.    IL/MEDQ  D:  05/24/2003  T:  05/27/2003  Job:  [id number]

## 2011-01-24 NOTE — H&P (Signed)
NAME:  [name] [name], [name] [name]                             ACCOUNT NO.:  [id number]   MEDICAL RECORD NO.:  [id number]                   PATIENT TYPE:  IPS   LOCATION:  0401                                 FACILITY:  [facility]   PHYSICIAN:  Jeanice Lim, M.D.              DATE OF BIRTH:  Sep 01, 1971   DATE OF ADMISSION:  07/26/2002  DATE OF DISCHARGE:                         PSYCHIATRIC ADMISSION ASSESSMENT   IDENTIFYING INFORMATION:  The patient is [name] 40 year old married white male  involuntarily committed on July 26, 2002.   HISTORY OF PRESENT ILLNESS:  The patient presents with [name] history of  commitment.  Papers report that the patient was delusional, believing that  someone had given him some rat poison, suspicious of the hospital staff,  experiencing positive auditory hallucinations, positive homicidal ideation  toward an unidentified individual who was smoking, making threatening  comments that everyone is going die real fast.  He has [name] history of  noncompliance with his medications.  He reports he has been drinking,  drinking two 40 ounce beers recently.  He is experiencing positive auditory  hallucinations but would not disclose what the voices are telling him.  He  denies any homicidal or suicidal ideation at this time.  He reports that  someone has been giving him rat poison but is not sure why.  The patient  does state that his wife is currently in [name] hospital for treatment.   PAST PSYCHIATRIC HISTORY:  He has had multiple hospitalizations at  [facility] for psychosis and depression.  He has [name] history of  several overdoses.   SUBSTANCE ABUSE HISTORY:  He is [name] nonsmoker.  He has occasional alcohol use;  no apparent substance abuse.   PAST MEDICAL HISTORY:  Primary care [facility]: Unknown.  Medical problems:  Hypertension.   MEDICATIONS:  1. Trileptal 150 mg b.i.d.  2. Seroquel 75 mg q.h.s.; that was prescribed in July 2003.   DRUG ALLERGIES:  IBUPROFEN, ASPIRIN,  NSAIDS.   PHYSICAL EXAMINATION:  GENERAL:  Physical examination was performed at [facility] with no significant findings.  The patient appears well nourished.  VITAL SIGNS:  Vital signs today are temperature 98.7, heart rate 98,  respirations 20, blood pressure 120/84.   LABORATORY DATA:  CBC is within normal limits.  CMET is within normal  limits.  Urine drug screen is negative.  Alcohol level is 27.   SOCIAL HISTORY:  He is [name] 40 year old married white male, married for three  years.  He lives with his wife and 5-year-old child.  The patient works but  did not disclose where.   FAMILY HISTORY:  Family history is unclear.   MENTAL STATUS EXAM:  He is in bed with no eye contact.  He is refusing to  provide much information, turned over after [name] few minutes of interview.  Speech is clear, cursing at moments.  Mood is angry.  Affect is irritable.  Thought processes are somewhat disorganized; positive auditory  hallucinations, positive paranoid ideation, positive delusions, some  questionable homicidal ideation but denies any at this time.  Cognitive:  Intact; appears to know where he is and person.  Judgment and insight are  poor.   ADMISSION DIAGNOSES:   AXIS I:  1. Psychosis, not otherwise specified.  2. Rule out paranoid schizophrenia.  3. Alcohol abuse.   AXIS II:  Deferred.   AXIS III:  None.   AXIS IV:  Problems with primary support group, other psychosocial problems.   AXIS V:  Current is 25, this past year is 60-65.   INITIAL PLAN OF CARE:  Plan is an involuntary commitment to [facility] for psychosis, homicidal ideation, and alcohol abuse.  Contract for safety, check every 15 minutes.  Will put the patient on the  400 Hall.  Obtain labs.  Will resume his medications.  Stabilize mood and  thinking so the patient can be safe, be medication compliant, to follow up  with [facility], to remain alcohol free.   ESTIMATED LENGTH OF  STAY:  Four to five days or more depending on the  patient's clearing.     Landry Corporal, N.P.                       Jeanice Lim, M.D.    JO/MEDQ  D:  07/26/2002  T:  07/26/2002  Job:  [id number]

## 2011-01-24 NOTE — Discharge Summary (Signed)
[place]. [facility]  Patient:    [name], [name]                          MRN: [id number] Adm. Date:  [id number] Disc. Date: 08/17/00 Attending:  Marlyn Corporal Fabmy Dictator:   Doreatha Martin, M.D.                           Discharge Summary  DISCHARGE DIAGNOSES: 1. Drug overdose. 2. Schizophrenia.  CONSULTATIONS:  Dr. Adelene Amas. [place], psychiatry.  DISCHARGE MEDICATIONS:  None.  HISTORY OF PRESENT ILLNESS:  [name] is [name] 40 year old white male with [name] history of schizophrenia, followed by Dr. Katrinka Blazing here in [place], who presented to the emergency room after having taken 40-50 tablets of an antipsychotic by the trade name of Geodon (ziprasidone).  Patient states that he took the pills in order to "make the voices stop."  Patient states he did so on the night prior to admission around 8 p.m., when he states that he was hearing voices of women screaming and he could not make them stop.  He subsequently took 40-50 of these pills and tried to go to sleep.  He states that he had some difficulty going to sleep but that he finally did become somnolent and slept thorough most of the day the next day.  When he woke up, he told his mother that he had overdosed on his medication the night prior, and at that time his mother brought him to the emergency room.  Patient denied any suicidality at the time of the overdose or at the time of this interview. Patient also denied hearing any voices at the time of our interview.  Patient denied any symptoms of nausea, vomiting, diarrhea, headache, chest pain, shortness of breath, fever, chills, or abdominal pain over the past 24 hours.  PHYSICAL EXAMINATION:  VITAL SIGNS:  Temperature 98.1, pulse 93, blood pressure 129/77, respiratory rate 20, oxygen saturation 99% on room air.  GENERAL:  This is [name] moderately-anxious, younger appearing than stated age white male who is rather demanding and reluctant to cooperate.   Patient is demanding to go smoke.  HEENT:  PERRLA, small pupils but reactive symmetrically.  No oropharyngeal lesions.  NECK:  No thyromegaly, no JVD, no lymphadenopathy.  LUNGS:  Clear to auscultation bilaterally.  CARDIOVASCULAR:  Regular rate and rhythm, S1, S2, no murmurs, rubs, or gallops.  ABDOMEN:  Soft, nontender, nondistended with active bowel sounds.  EXTREMITIES:  There is no edema.  Pedal pulses are bounding bilaterally.  NEUROLOGIC:  Alert and oriented x 3.  Cranial nerves 2-12 are grossly intact. Motor 5/5 in all muscle groups.  There are no sensory deficits.  LABORATORY ON ADMISSION:  Sodium 136, potassium 3.5, chloride 106, bicarb 27, BUN 9, creatinine 0.8, glucose 92, calcium 9.2, protein 7.0, albumin 4.1, total bilirubin 0.8, magnesium 2.2, AST 22, ALT 25, alkaline phosphatase 53. Alcohol level less than 10.  Acetaminophen level 1.7.  Aspirin level less than 4.0.  ABG showed 7.41/46/93/29 on 97% saturations, room air.  Urine drug screen was negative for all substances.  EKG showed normal sinus rhythm at 79 beats per minute with tented T waves V2, V3, V4.  QT interval 0.38, QTC 0.44.  HISTORY OF PRESENT ILLNESS: #1 - MEDICATION OVERDOSE:  [name] overdosed on ziprazadone, which is [name] new antipsychotic on the market.  The side effects of this medication are  not well known.  Some of the possible effects include CNS depression, QT prolongation, seizures, extrapyramidal effects, increased liver function enzymes, and postural hypotension.  Patient was alert and oriented at the time of admission and did not show any of the above signs or symptoms.  He did have [name] slight QTC prolongation; however, patient was admitted to telemetry and no further events were recorded.  Patient was treated with activated charcoal in the emergency room.  His liver function enzymes were monitored and remained within normal limits throughout the hospitalization.  Mr. Pracht never developed  any neurologic deficits.  Patient was also placed under [name] 24-hour watch secondary to possible suicidal ideation, although this patient denied any suicidal ideation from the time of admission.  Dr. Katrinka Blazing, his psychiatrist, was contacted in the morning and he agreed to have Dr. Jeanie Sewer come in and evaluate [name] prior to discharge.  Patient was very disruptive during the hospitalization.  On hospital day #2, he decided that he wanted to leave the hospital against medical advice and was physically trying to leave the facility.  Security had to be called and patient had to be given Ativan 2 mg IM.  Patient was subsequently evaluated by Dr. Jeanie Sewer and released.  #2 - SCHIZOPHRENIA:  Patient did not have any active psychotic symptoms at the time of admission or hospitalization.  Patient is to follow up with his psychiatric in the community, Dr. Katrinka Blazing. DD:  08/20/00 TD:  08/21/00 Job: [id number]

## 2011-01-24 NOTE — H&P (Signed)
[facility]  Patient:    [name], [name] A Visit Number: [id number] MRN: [id number]          Service Type: PSY Location: 400 [id number] 02 Attending Physician:  Rachael Fee Dictated by:   Candi Leash. Orsini, N.P. Admit Date:  03/31/2002                     Psychiatric Admission Assessment  IDENTIFYING INFORMATION:  A 40 year old married white male, voluntarily admitted on March 31, 2002.  HISTORY OF PRESENT ILLNESS:  The patient presents with a history of overdose on 10 Seroquel 100 mg tablets and 6 Ativan tablets on Wednesday.  The patient spent a night in ICU at [facility] and was transferred here after the patient was medically stable.  The patient overdosed at home.  His wife was there.  He states he had not been thinking about hurting himself.  He has been very overwhelmed with his wifes illness.  Wife presently has leukemia and is hoping to have a bone marrow transplant.  Also taking care of a small 60-month- old child at home.  Has little social support.  Having problems with his car. Arguing with his wife.  The patient states he just could not take it anymore. He denied any psychotic symptoms.  Sleep has been fair, his appetite has been fair.  His wife was in the interview with the client with clients permission. The patient states they both want the patient out by Tuesday when the patients wife is scheduled to have surgery.  PAST PSYCHIATRIC HISTORY:  The patient sees Dr. Hortencia Pilar at [facility].  The patient has had 3 admissions to [facility].  SOCIAL HISTORY:   A 40 year old married white male.  He has been married for 3 years, has an 89-month-old child.  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 No legal problems.  FAMILY HISTORY:  None.  ALCOHOL DRUG HISTORY:  The patient smokes, denies any alcohol or substance abuse.  He did have a past history of substance abuse, has been clean for the past 4 years.  PAST  MEDICAL HISTORY:  Primary care Kelsei Defino is Dr. Marcy Salvo and Dr. Arlyce Dice in [place].  Medical problems are chronic bronchitis.  MEDICATIONS:  Seroquel 100 mg q.h.s.  Has been on BuSpar.  The patient states he stopped taking this medication because he was getting aggressive.  Also on Ativan.  DRUG ALLERGIES:  NSAIDS.  PHYSICAL EXAMINATION:  Performed while the patient was hospitalized at [facility].  His urine drug screen was positive for tricyclics.  The patient has a borderline EKG.  His potassium was 3.3.  MENTAL STATUS EXAMINATION:  He is an alert, young adult, casually dressed male.  His wife was in the room with his permission.  Speech is clear and hyperverbal.  Mood is anxious, affect is anxious.  Thought processes are coherent.  No auditory or visual hallucinations, no paranoia.  Cognitive function intact.  Memory is fair, judgment is fair, insight is poor.  ADMISSION DIAGNOSES: Axis I:    1. Major depression with suicide attempt.            2. Rule out anxiety disorder.            3. Rule out schizoaffective disorder. Axis II:   Deferred. Axis III:  Chronic bronchitis. Axis IV:   Problems with primary support group, occupation, economic,            other psychosocial problems. Axis  V:    Current is 30, this past year 66.  PLAN:  Voluntary admission for intentional overdose.  Contract for safety, check every 15 minutes.  Medications will be resumed.  Will monitor the patient closely.  Will have a family session.  Wife and client together both need family therapy and case worker will discuss different options.  We will also repeat the patients labs.  His potassium was low at 3.3.  Will encourage fluids.  TENTATIVE LENGTH OF CARE:  3-5 days. Dictated by:   Candi Leash. Orsini, N.P. Attending Physician:  Rachael Fee DD:  04/01/02 TD:  04/03/02 Job: [id number]

## 2011-01-24 NOTE — H&P (Signed)
[facility]  Patient:    Mixson, [place] A                          MRN: [id number] Adm. Date:  [id number] Disc. Date: [id number] Attending:  Beryle Beams                         History and Physical  HISTORY OF PRESENT ILLNESS:  The patient is a 40 year old adult Caucasian male who is unemployed.  The patient is schizophrenic with a 10 year history of multiple previous psychiatric hospitalizations.  He was hospitalized at [facility] 10 years ago.  He has been under the care of the mental health center.  However, more recently, he has seen Dr. Elna Breslow.  The patient took and overdose of 50 tablets of Geodon 40 mg and was admitted to the ER.  He spent several days on 3700 units at [facility] and then was transferred here when he became medically stable.  Stressors include his mother evicting him and his wife from her house, financial problems, and marital problems.  His wife is reportedly addicted to street drugs.  The patient is on disability.  The patient states that he has been hallucinating the voice of his wife screaming and this has upset him.  Looking at his past medications indicates that his best response has been to a combination of Ativan and Prolixin Decanoate.  PAST PSYCHIATRIC HISTORY:  Extensive as noted previously.  SOCIAL HISTORY:  He has been married for two years.  He has no children.  Last publicly employed in 1996.  Lives with his mother, wife, and grandmother.  FAMILY HISTORY:  Extensive history of drugs and alcohol run on both sides of the family.  MEDICAL HISTORY:  In terms of his alcohol and drug use, he has not used any substances recently.  He is under the care of Dr. Pati Gallo.  He has had surgery twice on his left knee and has multiple skeletal aches.  PHYSICAL EXAMINATION:  The patient presents as a slim male in no acute distress.  Weight 171 pounds, height 6 feet 1 inch.  Vital signs are  stable with pulse 97 and blood pressure 130/85.  Temperature and respiratory rate are normal.  Head: Normocephalic, atraumatic with normal texture and distribution of hair.  Pupils equal, round and reactive to light.  Flat.  Neck: Supple without thyromegaly or masses.  Chest: Normal to P & A.  Heart: Normal sounds, no murmurs or gallop.  Abdomen was scaphoid without organomegaly or masses. Neurologic: Cranial nerves II-XII intact.  Reflexes 2+ and bilaterally equal. ______ .  Sensations and cerebellar functions are intact.  Musculoskeletal: No clubbing or edema.  MENTAL STATUS EXAMINATION:  The patient presents ______ male who was cooperative and pleasant.  Speech was normal in tone and flow.  Mood was perplexed.  Affect was flat but consistent with thought content.  Did show sign of an unspecific thought disorder and previously admitted to experiencing auditory hallucinations.  Oriented to time, place, and situation.  Memory was globally intact.  DIAGNOSES: Axis I:    Schizophrenia, undifferentiated type. Axis II:   Deferred. Axis III:  No diagnosis. Axis IV:   Severe; problems with support group. Axis V:    35 on admission and 55 during the past year.  TREATMENT CONSIDERATIONS:  The patient will be established on a combination of Lorazepam 1  mg t.i.d., Artane 2 mg b.i.d., and Prolixin Decanoate up to 1.5 cc every two weeks.  The patient is to be followed daily for medication management and psychotherapy and is to participate in group, milieu, and activity therapies as tolerated. DD:  08/24/00 TD:  08/24/00 Job: [id number]

## 2011-01-24 NOTE — Discharge Summary (Signed)
NAME:  [name], [name]                             ACCOUNT NO.:  [id number]   MEDICAL RECORD NO.:  [id number]                   PATIENT TYPE:  OBV   LOCATION:  6704                                 FACILITY:  [facility]   PHYSICIAN:  Hettie Holstein, D.O.                 DATE OF BIRTH:  11/09/1970   DATE OF ADMISSION:  02/20/2004  DATE OF DISCHARGE:  02/22/2004                                 DISCHARGE SUMMARY   TRANSFER SUMMARY:   PRIMARY CARE PHYSICIAN:  Unassigned.   ADMISSION DIAGNOSES:  1. Multidrug overdose.  2. Schizoaffective disorder with aggressive features in the past.   DISCHARGE DIAGNOSES:  1. Multidrug overdose, status post the ingestion of Geodon and Zyprexa.  2. Schizoaffective disorder with aggressive features in the past.  3. Status post observation hospitalization and supervision for suicide     gestures.  Currently awaiting evaluation from [name] psychiatrist, Dr.     Jeanie Sewer.   DISCHARGE MEDICATIONS:  To resume at the discretion of his psychiatrist  Geodon and Zyprexa.   STUDIES PERFORMED THIS ADMISSION:  Urine drug screen, which was negative.  Unremarkable urinalysis.  Metabolic panel in addition unremarkable.  CBC  within normal limits.  Tylenol and aspirin levels were within normal limits,  and alcohol was less than 5 mg/dl.   HISTORY OF PRESENT ILLNESS:  For full details, please see HPI as dictated by  Vania Rea, M.D.  However, briefly, this is [name] 40 year old Caucasian  male with [name] history of schizoaffective disorder who was brought in by the  police and EMS with [name] history of having overdosed on Zyprexa 10 to 12 5 mm  tablets, and Geodon two 20 mg tablets.  The patient was apparently alert and  oriented when brought into the emergency room but subsequently became  unresponsive.  The EMS history was apparently that the patient was brought  in two hours after overdose.  The patient denied, according to the emergency  room records, suicidal ideation.  At the time  of the initial encounter he  was noted, however, to become progressively drowsy and the medical service  was called to admit him.   HOSPITAL COURSE:  The patient was admitted.  He slowly resolved from the  effects of the overdose.  Poison control was notified and he endured the  acute phase without sequelae and is currently medically stable, awake,  eating, and ambulating without further complaint; however, there is still  suspicion of suicidal ideation, and therefore we are asking the assistance  and evaluation of Dr. Jeanie Sewer and in addition maintaining 24-hour  supervision while he is here in the hospital.  Anticipate he will be treated  to [name] psychiatric behavioral unit after seen by Dr. Jeanie Sewer.  Hettie Holstein, D.O.   ESS/MEDQ  D:  02/22/2004  T:  02/22/2004  Job:  [id number]

## 2011-01-24 NOTE — Discharge Summary (Signed)
[facility]  Patient:    [name], [name]                          MRN: [id number] Adm. Date:  [id number] Disc. Date: [id number] Attending:  Marlyn Corporal Fabmy                           Discharge Summary  ADMISSION DIAGNOSES: AXIS I.   Schizophrenia, undifferentiated type. AXIS II.  Deferred. AXIS III. No diagnosis. AXIS IV.  Severe, psychosocial problems and problems with primary support           group. AXIS V.   Global Assessment of Functioning 35 on admission and 55 to 60 during           the past year.  DISCHARGE DIAGNOSES: AXIS I.   Schizophrenia, undifferentiated type. AXIS II.  Deferred. AXIS III. No diagnosis. AXIS IV.  Severe, problems with primary support group. AXIS V.   Global Assessment of Functioning 35 on admission and 55 to 60 on           discharge.  BRIEF HISTORY:  This is one of multiple psychiatric hospitalizations for this 40 year old married Caucasian male.  The patient is under the care of Fortunato Curling, M.D., and took overdose of 50 40 mg Geodon tablets.  He was stabilized at [facility]. [facility] and transferred for further care.  At the time of admission, the patient was hallucinating the voice of his wife screaming and this was very frightening to him.  The patient has been under [name] lot of stress because his mother had evicted him and his wife since his wife uses street drugs and he has _________ problems.  The patient is on disability and is unable to make ends meet without his mothers support.  PERTINENT PHYSICAL AND LABORATORY DATA:  Physical examination was conducted at [facility]. [facility] and we did not feel that he needed to have any more lab work.  HOSPITAL COURSE:  The patient was given three shots two days apart of Prolixin Decanoate 12.5 mg at [name] time.  The patient tolerated the medication well.  He was initially placed on Lorazepam standing 1 mg t.i.d. but subsequently, the Lorazepam  was delivered on [name] p.r.n. basis. The patient tolerated his medication well and with benefit.  He is now sleeping through the night.  His hallucinations are practically gone and he only hears voices just prior to falling asleep at night, but he is sleeping through the night.  He does not show any evidence of thought disorder, which has cleared.  The patient has been active in the milieu, at attending groups and working with his case worker.  At this point, it is felt that he is optimally improved.  He denies suicidal or homicidal ideations and is interacting well with the examiner.  MEDICATIONS AND FOLLOW-UP:  The patient is discharge with prescriptions for: 1. Artane 2 mg one b.i.d. 2. Lorazepam 1 mg t.i.d. p.r.n. 3. He is due for [name] Prolixin Decanoate injection 1.5 cc on September 03, 2000.    He was given [name] prescription for that and he is to take his vial with him    to Dr. Lonn Georgia office.  He is to call me or Dr. Katrinka Blazing should [name] crisis develop. DD:  08/24/00 TD:  08/24/00 Job: [id number]

## 2011-01-24 NOTE — H&P (Signed)
NAME:  Esty, [place] A                             ACCOUNT NO.:  [id number]   MEDICAL RECORD NO.:  [id number]                   PATIENT TYPE:  EMS   LOCATION:  MAJO                                 FACILITY:  [facility]   PHYSICIAN:  Vania Rea, M.D.              DATE OF BIRTH:  1971/05/31   DATE OF ADMISSION:  02/20/2004  DATE OF DISCHARGE:                                HISTORY & PHYSICAL   CHIEF COMPLAINT:  Multi-drug overdose.   HISTORY OF PRESENT ILLNESS:  This is a 40 year old Caucasian man with a  history of schizoaffective disorder who was brought in by the police and EMS  with a history of having overdosed on Zyprexa, ten to twelve 5 mg tablets,  and Geodon 2 times 20 mg tablets.  The patient was apparently alert and  oriented when brought into the emergency room, but subsequently became  unresponsive.  EMS history was apparently that the patient was brought in 2  hours after overdose.  The patient is recorded as being in the emergency  room at 9:45.  The patient apparently denied suicidal ideation in his  emergency room course, but the patient has become progressively more drowsy,  and the medical service has been called to admit him.   PAST MEDICAL HISTORY:  The patient is unable to give a history, but review  of his chart reveals multiple admissions to behavioral health under the  supervision of Dr. Geoffery Lyons.  Also reveals multiple past episodes of drug  overdose.  The last discharge was in September of 2004, and discharge  medications included Geodon and Ambien.  The rest of the medical history is  from past medical records.   PAST MEDICAL HISTORY:  1. Headaches.  2. Alcohol abuse.  3. Tobacco abuse.  4. Prior history of drug overdose.  5. Schizoaffective disorder.   MEDICATIONS:  Unknown, but include Zyprexa and apparently Geodon.   ALLERGIES:  PENICILLIN, unknown reaction.   SOCIAL HISTORY:  Smokes and drinks; quantities unclear.  Records indicate  that up to  a year ago he was separated from his wife.  He has a 30-year-old  child.  He lives with his mother at this time, and he is unemployed.   FAMILY HISTORY:  We are unable to obtain.   PHYSICAL EXAMINATION:  GENERAL:  This is a young Caucasian man lying on the  stretcher in deep sleep, but arousable when attempting to assess his gag  reflex.  He does not respond to touch otherwise, or pain.  VITAL SIGNS:  Pulse is 68, blood pressure is 127/77, respirations 12,  temperature is 99.1.  He is saturating at 99% on room air.  HEENT:  Pink and anicteric.  He is mildly dehydrated.  There is no  thyromegaly, no lymphadenopathy.  His pupils are 2-3 mm, round, and equally  active, but sluggishly reactive.  There was no jugular venous distention.  CHEST:  Clear to auscultation bilaterally.  CARDIOVASCULAR:  He has a regular rhythm without murmur.  ABDOMEN:  Soft and nontender.  EXTREMITIES:  Without edema.  He has 3+ pulses bilaterally.  CNS:  He is drowsy and responds violently to having a spatula pushed to the  back of his throat.  He sat up on the stretcher and began cursing.  There  was no obvious focal deficit.  GU:  He has a Foley catheter in place draining clear urine.  SKIN:  He has 2 pustules - one on his upper abdomen, and one on his upper  chest.  These seemed to be merely follicles, but need watching in case this  is early chicken pox.   When questioned if he was having an upper respiratory infection, he does not  respond.  He is not having any evidence of rhinorrhea or pharyngeal  congestion.   LABORATORY STUDIES:  His urine toxicology is no detectable Tylenol or  alcohol or salicylates.  His urinalysis is straw-colored, clear.  Specific  gravity of 1.011, pH 7, and is otherwise completely bland.  His CBC is  completely normal with a white count of 9.9, hematocrit of 45.8, MCV of 87,  RDW of 13.8.  His platelet count is depressed at 174.  There is a normal  differential on the white  count.  His serum chemistries likewise are  unremarkable with a sodium of 140, potassium 3.5, chloride of 110, CO2 of  25, glucose of 94, a BUN of 5, and a creatinine of 0.9.  His calcium is 8.7.  His EKG shows normal sinus rhythm with a rate of 65.  The QT interval is  actually prolonged, because he has a very shallow U wave.  It is being read  as right bundle branch block, incomplete, but I do not agree with that.   ASSESSMENT:  1. Multi-drug overdose.  2. Schizoaffective disorder with aggressive features.   PLAN:  The patient is in a deep sleep.  He does have a good gag reflex, and  he does become very alert and respond to deep stimuli.  He is at the moment  in 2-point restraints for his own safety.  I do not think intubation is  necessary at this time.  I have discussed it with his __________ and they  have indicated that the Zyprexa has a maximal effect in 2 hours, and a peak  concentration in 6 hours.  If we can assume from the records that he  ingested at about 4:45, and peak concentration should be around now, 1:45.  Therefore, we should see him if he gets any worse than he is now.  Will  observe him overnight, and decide on __________ things later in the day.  If  condition seems to be deteriorating, will have a low threshold for  intubation.     ___________________________________________                                         Vania Rea, M.D.   LC/MEDQ  D:  02/21/2004  T:  02/21/2004  Job:  [id number]

## 2011-01-24 NOTE — Discharge Summary (Signed)
NAME:  [name], [name]                             ACCOUNT NO.:  [id number]   MEDICAL RECORD NO.:  [id number]                   PATIENT TYPE:  IPS   LOCATION:  0404                                 FACILITY:  [facility]   PHYSICIAN:  Geoffery Lyons, M.D.                   DATE OF BIRTH:  Jan 22, 1971   DATE OF ADMISSION:  07/26/2002  DATE OF DISCHARGE:  08/02/2002                                 DISCHARGE SUMMARY   CHIEF COMPLAINT AND HISTORY OF PRESENT ILLNESS:  This was one of multiple  admissions to [facility] for this 40-  year-old, married, white male involuntarily committed.  The patient was  delusional, believing that someone had given him some rate poisoning and  suspicious of the hospital staff.  Positive auditory hallucinations.  Positive homicidal ideations toward an unidentified individual.  He was  smoking and making threatening comments that everyone is going to die real  fast.  Noncompliant with medications.  Drinking two 40-ounce beers  recently.  Has been __________ hallucinations.  Will not disclose what the  voices were saying.   PAST PSYCHIATRIC HISTORY:  Multiple times at [name].  History of several doses.  Poor compliance with  outpatient treatment.   ALCOHOL AND DRUG HISTORY:  Occasional use of alcohol.  No substance use.   PAST MEDICAL HISTORY:  Hypertension.   MEDICATIONS:  1. Trileptal 150 mg twice [name] day.  2. Seroquel 75 mg at night.   PHYSICAL EXAMINATION:  Performed.  Did not show any acute findings.   MENTAL STATUS EXAM:  An alert and uncooperative male refusing to give out  much information.  Speech was clear.  Cursing at moments.  Mood was angry.  Affect was irritable.  Thought process was somewhat disorganized.  Positive  for auditory hallucinations.  Positive for paranoid ideas.  Positive  delusions.  Cognition well preserved.   DIAGNOSES:   AXIS I:  1. Schizoaffective  disorder.  2. Hypomanic.   AXIS II:  No diagnosis.   AXIS III:  No diagnosis.   AXIS IV:  Psychosocial stressors:  Moderate.   AXIS V:  Global Assessment of Functioning on admission 25-30 and highest in  the last year 60-65.   HOSPITAL COURSE:  The patient was admitted and started in intensive  individual and group psychotherapy.  We went ahead and started working with  Zyprexa.  He was given Seroquel and Trileptal.  Seroquel was discontinued.  Trileptal was increased to 300 mg twice [name] day with Zyprexa 5 to 10 mg at  bedtime.  Trileptal was increased to 300 mg three times [name] day and the  __________ was increased to 15 mg at bedtime.  Eventually Zyprexa and  __________ was increased to 20 mg and 25 mg at night.  His stay was  characterized by [name]  lot irritation, agitation, and very labile affect.  His  thought was mainly disorganized with underlying paranoia.  He was very upset  with his wife over the phone and the family of his wife, thinking that they  were plotting.  He destroyed [name] phone in the unit and became more and more  out of control.  He threatened staff, stating that he knew where the staff  lived and that he was going to get the staff once he was discharged.  It was  felt that he could not be held in our institution, so we went ahead and  transferred to [facility] for further stabilization.   DISCHARGE DIAGNOSES:   AXIS I:  Schizoaffective disorder, hypomanic.   AXIS II:  No diagnosis.   AXIS III:  No diagnosis.   AXIS IV:  Psychosocial stressors:  Moderate.   AXIS V:  Global Assessment of Functioning on discharge 25-30.   DISCHARGE MEDICATIONS:  Transferred on Trileptal 300 mg three times [name] day,  Zyprexa 25 mg at night, Zyprexa 35 mg every six hours as needed for anxiety  and agitation, Ativan 1 mg by mouth or IM every six hours as needed for  agitation.   DISPOSITION:  Transferred to [facility] for further  stabilization.                                                Geoffery Lyons, M.D.    IL/MEDQ  D:  09/13/2002  T:  09/13/2002  Job:  [id number]

## 2011-01-24 NOTE — H&P (Signed)
NAME:  Westerman, [place] A                             ACCOUNT NO.:  [id number]   MEDICAL RECORD NO.:  [id number]                   PATIENT TYPE:  IPS   LOCATION:  0402                                 FACILITY:  [facility]   PHYSICIAN:  Geoffery Lyons, M.D.                   DATE OF BIRTH:  1971-06-30   DATE OF ADMISSION:  02/22/2004  DATE OF DISCHARGE:                         PSYCHIATRIC ADMISSION ASSESSMENT   IDENTIFYING INFORMATION:  A 40 year old widowed white male, involuntarily  committed on February 22, 2004.   HISTORY OF PRESENT ILLNESS:  The patient presents with a history of  intentional overdose on Zyprexa on Tuesday, taking approximately 10 or 12  pills.  The patient states his intention was to sleep.  He denies it was a  suicidal plan to kill himself.  He is here on commitment. The patient states  he has not been sleeping just recently.  His wife passed away from cancer in  10/02/22 of this year.  There is some questionable noncompliance with  medications, states he has been self medicating.   PAST PSYCHIATRIC HISTORY:  Multiple admissions.  The patient sees Dr. Lolly Mustache  as an outpatient.  Has a history of overdosing on Zyprexa.   SOCIAL HISTORY:  He is a 40 year old widowed white male.  The patient has a  young child who is currently with his mother.  He does delivery work.  His  wife passed away in 2022/10/02.   FAMILY HISTORY:  Unclear.   ALCOHOL DRUG HISTORY:  The patient smokes.  He has been drinking beer up to  a 6-pack, using marijuana on occasion.   PAST MEDICAL HISTORY:  Primary care Adrian Dinovo is none.  Medical problems are  none.   MEDICATIONS:  Has been on Zyprexa and Geodon 40 mg at bedtime.   DRUG ALLERGIES:  MOTRIN, COUMADIN.   PHYSICAL EXAMINATION:  The patient was assessed for his overdose at [facility].   MENTAL STATUS EXAM:  He is an alert young male, alert, cooperative, fair eye  contact, casually dressed.  Speech is pressured, tangential.  The patient  feels good  today.  His affect:  He is distracted, hypomanic.  Thought  processes:  Endorsing positive auditory hallucinations prior to admission,  does not appear to be responding at this time.  Cognitive function intact,  memory is fair, judgment and insight are poor.  He is a poor historian.   ADMISSION DIAGNOSES:   AXIS I:  Schizoaffective disorder, bipolar type.   AXIS II:  Deferred.   AXIS III:  None.   AXIS IV:  Other psychosocial problems, grief, child custody and mental  illness.   AXIS V:  Current is 25, past year 71.   PLAN:  Involuntary commitment for intentional overdose.  Contract for  safety.  The patient will be placed in the 400 hall.  Stabilize mood and  thinking.  We will  resume his Zyprexa for mood instability.  We will  increase coping skills.  The patient is to attend groups.  Will have family  session with mother.  The patient is to follow up with Dr. Lolly Mustache.  Will  also have Librium available for any signs or symptoms of withdrawal  symptoms.   TENTATIVE LENGTH OF CARE:  4-6 days or more depending on the patient's  response or need of placement.     Landry Corporal, N.P.                       Geoffery Lyons, M.D.    JO/MEDQ  D:  02/27/2004  T:  02/27/2004  Job:  [id number]

## 2011-01-24 NOTE — Discharge Summary (Signed)
NAME:  [name], [name]                             ACCOUNT NO.:  [id number]   MEDICAL RECORD NO.:  [id number]                   PATIENT TYPE:  IPS   LOCATION:  0403                                 FACILITY:  [facility]   PHYSICIAN:  Jeanice Lim, MD                DATE OF BIRTH:  [birth year]   DATE OF ADMISSION:  03/31/2002  DATE OF DISCHARGE:  04/04/2002                                 DISCHARGE SUMMARY   IDENTIFYING DATA:  This is [name] 40 year old Caucasian male, voluntarily  admitted with history of overdosing on Seroquel and Ativan.  Patient has had  conflict with his wife, and mood swings and felt desperate and impulsive.   ADMISSION MEDICATIONS:  Seroquel, BuSpar, and Ativan.   ALLERGIES:  NSAIDS.  Marland Kitchen   PHYSICAL EXAMINATION:  Within normal limits, neurologically nonfocal.   ROUTINE ADMISSION LABS:  Urine drug screen positive for tricyclics,  potassium quite low at 3.3   MENTAL STATUS EXAM:  Alert, young, casually dressed male.  Speech clear,  somewhat hyperverbal.  Mood anxious, dysphoric.  Affect somewhat labile.  Thought process goal directed, tangential at times.  Questionable auditory  hallucinations.  No suicidal or homicidal ideation.  Cognitively intact.  Judgment and insight poor.   ADMISSION DIAGNOSES:   AXIS I:  1. Major depression, status post suicide attempt.  2. Rule out schizoaffective disorder, bipolar type.   AXIS II:  Deferred.   AXIS III:  Chronic bronchitis.   AXIS IV:  Moderate problems with primary support group and relationship.   AXIS V:  30/65.   HOSPITAL COURSE:  The patient was admitted and ordered routine p.r.n.  medications, was given [name] trial on Trileptal and Seroquel.  The patient  initially refused to take medications, however, his wife came in and family  session was held.  The patient was agreeable to take medicine, and reported  [name] decrease in mood lability and positive response to medication changes.   CONDITION ON DISCHARGE:  Mood was  more euthymic, affect brighter, thought  process goal directed.  Thought content negative for dangerous ideation or  psychotic symptoms.  The patient reported an increase in insight regarding  the fact that he had [name] mood disorder and needed to take medication and  reported motivation to be compliant with [name] followup plan and take the  medications as prescribed.   DISCHARGE MEDICATIONS:  1. Trileptal 150 mg q.[name].m. and q.6 p.m.  2. Seroquel 50-75 mg q.h.s.   DISPOSITION:  The patient was to follow up at [facility] Wednesday, July 30 at 10 [name].m.   DISCHARGE DIAGNOSES:   AXIS I:  1. Major depression, status post suicide attempt.  2. Rule out schizoaffective disorder, bipolar type.   AXIS II:  Deferred.   AXIS III:  Chronic bronchitis.   AXIS IV:  Moderate problems with primary support group and relationship.  AXIS V:  Global assessment of function at discharge 55-60.                                                Jeanice Lim, MD    JEM/MEDQ  D:  05/04/2002  T:  05/06/2002  Job:  [id number]

## 2011-01-24 NOTE — Op Note (Signed)
NAME:  [name], [name]                             ACCOUNT NO.:  [id number]   MEDICAL RECORD NO.:  [id number]                   PATIENT TYPE:  EMS   LOCATION:  ED                                   FACILITY:  [facility]   PHYSICIAN:  Excell Seltzer. Annabell Howells, M.D.                 DATE OF BIRTH:  19-Jun-1971   DATE OF PROCEDURE:  06/04/2002  DATE OF DISCHARGE:  06/04/2002                                 OPERATIVE REPORT   PROCEDURE:  Cystoscopy with removal of foreign body.   PREOPERATIVE DIAGNOSIS:  Foreign body in bladder.   POSTOPERATIVE DIAGNOSIS:  Foreign body in bladder.   SURGEON:  Excell Seltzer. Annabell Howells, M.D.   ANESTHESIA:  Local.   SPECIMENS:  Was 4 inch x 1/4 inch diameter plastic tubing.  The specimen was  discarded.   COMPLICATIONS:  None.   INDICATIONS:  Mr. Achord is [name] 40 year old white male, with [name] history of  paranoid schizophrenia, who placed [name] plastic tubing in his penis for erotic  purposes and was unable to retrieve it.  He came to the emergency room where  [name] KUB demonstrated the shadow overlying the bladder, and it was felt that  cystoscopy and foreign body removal were indicated.  I initially recommended  that he have this done in the operating room, but he was concerned that the  general anesthetic would aggravate his paranoid symptoms, so he elected to  have an attempt at removal with flexible cystoscopy in the ER.   FINDINGS AND PROCEDURE:  The patient was placed supine.  His penis was  prepped with Betadine.  He was draped with sterile towels.  His urethra was  instilled with 2% lidocaine jelly.  [name] 16-French flexible scope was then  passed without difficulty.  Examination revealed [name] normal urethra and intact  external sphincter.  The prostatic urethra was unremarkable without  significant obstruction.  Examination of the bladder revealed [name] clear  plastic tubing bridging the bladder wall from side to side with an  approximate diameter of [name] quarter inch.  There were some  inflammatory  changes on the bladder wall where the tubing contacted the mucosa.  An  initial attempt was made to remove the tubing with [name] three-prong grasping  forcep.  This was unsuccessful due to the diameter of the tube.  I was then  able to successfully negotiate [name] 3-French flat wire basket around the tube  and engage it near the tip.  With this maneuver, I was able to remove the  plastic tubing.  Once the tubing was out, the patient was able to void  without difficulty.  He was given 1 g of Rocephin IM and will be sent home  with Levaquin and Urised with instructions to follow up with me in [name] week.  There were no complications.  Excell Seltzer. Annabell Howells, M.D.    JJW/MEDQ  D:  06/05/2002  T:  06/06/2002  Job:  [id number]

## 2011-01-24 NOTE — H&P (Signed)
NAME:  [place], [place] A                             ACCOUNT NO.:  [id number]   MEDICAL RECORD NO.:  [id number]                   PATIENT TYPE:  IPS   LOCATION:  0405                                 FACILITY:  [facility]   PHYSICIAN:  Geoffery Lyons, M.D.                   DATE OF BIRTH:  03/03/1971   DATE OF ADMISSION:  10/11/2002  DATE OF DISCHARGE:                         PSYCHIATRIC ADMISSION ASSESSMENT   IDENTIFYING INFORMATION:  This is a 40 year old white male who is a  voluntary admission.  He is married but currently separated.   HISTORY OF PRESENT ILLNESS:  This is one of multiple admissions for this  patient with a history of schizoaffective disorder.  He presented to the  hospital with paranoia, believing that he was being poisoned by rat poison  by nonspecific clerks in the grocery store.  He had been accompanied by his  wife.  He was hyper-verbal, with pressured speech, disorganized, with flight  of ideas and clear paranoia.  He has not taken any medications at any time  recently.  This is one of multiple admissions for similar symptoms.  He was  mildly agitated at the time of the interview but is generally directable  today.  He also has problems with chronic noncompliance with medications.  The patient apparently had recent stressors related to his relationship with  his wife and their recent separation.   PAST PSYCHIATRIC HISTORY:  See his previous histories in the record.  This  is one of multiple admissions.  The patient does have a history of 1 prior  overdose on Geodon in 2001.  He has a history of being managed in the past  with Prolixin Decanoate, Trilafon, Risperdal, Clozaril, and Zyprexa, all of  which have given him various side effects.   SOCIAL HISTORY:  The patient is married and now separated.  He has a 26  month old child who is a son.  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  He currently is living with his mother since being  separated from his wife for the past 2 weeks, and he does cite this as a  considerable stressor.   FAMILY HISTORY:  Unclear.   ALCOHOL AND DRUG HISTORY:  The patient has no prior evidence of any  substance abuse.   PAST MEDICAL HISTORY:  The patient's primary care Avabella Wailes is [facility].  Medical problems include some complaints of right knee  pain, and states he recently kicked a door with his right food, but denies  any direct trauma to the knee.  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  Past medical history is remarkable for some history of hypertension.  MEDICATIONS:  None.  The patient was previously managed on Zyprexa and  Trileptal at his last hospitalization but recently has been noncompliant for  at least 1 week, probably longer.   DRUG ALLERGIES:  NSAIDS, GUAIFENESIN, ASPIRIN, IBUPROFEN.   REVIEW OF SYSTEMS:  Remarkable for some chronic right knee pain, mostly with  flexion, no pain upon weight bearing.  Upon flexion sitting and standing,  the patient reports some mild, sharp pain behind his kneecap.   POSITIVE PHYSICAL FINDINGS:  Physical examination today is limited by the  patient's paranoia and disorganization.  This is a well-nourished, well-  developed male who appears to be his stated age.  Vital signs on admission:  Temperature 98.6, pulse 103, respirations 24, blood pressure 120/73, weight  180 pounds and is 6 feet 2-3/4 inches tall.  HEAD:  Normocephalic and atraumatic.  Sclerae are nonicteric.  No  rhinorrhea.  Oropharynx in good condition.  Hearing intact to normal voice.  NECK:  No thyromegaly or lymphadenopathy noted.  The patient is unable to  sit still for a stethoscope exam.  MUSCULOSKELETAL:  Shows no inflammation of the knee, full range of motion,  no crepitus noted at the knee with flexion.  He appears to  be in no acute  distress.  NEURO:  Facial symmetry is present.  Motor movements are symmetrical.  Motor  movements are smooth.  Sensory is grossly intact.  Grip strength is equal  bilaterally.  His gait is grossly normal.  No EPS is noted.   DIAGNOSTIC STUDIES:  Remarkable for normal CBC with WBC of 6.9 thousand,  hemoglobin 16.4, hematocrit 47.1.  His MCV is 86.4 and his platelets are  189,000.  CMET reveals normal electrolytes.  His BUN is 9, creatinine 1.0.  Liver enzymes are normal.  TSH is normal at 1.015.  His acetaminophen level  on admission was less than 10, salicylate level less than 4.  Urine drug  screen is negative.  Routine urinalysis is negative.   MENTAL STATUS EXAM:  This is a young white male who appears to be his stated  age.  He is hyperverbal, irritable, with an irritable, labile affect.  He is  able to be redirected in conversation and physically only with considerable  effort and coaxing.  He is hyperverbal with pressured speech.  Mood is  elevated and irritable.  Thought process is agitated and disorganized,  revealing paranoia and some auditory hallucinations and he appears to be  responding to internal stimuli.  This is aggravated by the fact that he just  got off the phone with his wife prior to this interview and he is pacing and  having some difficulty sitting still.  He is quite annoyed with what he  seems to think are unreasonable demands on her part, but he is unable to be  specific about what those are.  Thought content reveals that he is generally  angry and when pressed he believes that people in grocery stores are  poisoning his food with rat poison and that this was the source of his  agitation and symptoms.  He is refusing to believe that he has an illness  that would respond to medication, and he insists that it is because of rat  poison that he is experiencing the auditory hallucinations.  He is unable to be specific about the hallucinations.  No  overt evidence of suicidal or  homicidal ideation.  Cognitively intact and oriented x3.   ADMISSION DIAGNOSIS:   AXIS I:  Schizoaffective disorder, bipolar type, hypomanic.   AXIS II:  Deferred.   AXIS III:  History of hypertension and right knee pain not otherwise  specified.   AXIS IV:  Moderate stress, with the separation from his wife and repeated  medication noncompliance.   AXIS V:  Current 12, past year 72.   INITIAL PLAN OF CARE:  Voluntarily admit the patient to stabilize his mood  and to improve his reality testing.  We have started by giving him Zyprexa  Zydis 10 mg p.o. q.a.m. and 15 mg at h.s. and 5 mg q.6 hours p.r.n.  We are  going to restart his Trileptal at 150 mg p.o. b.i.d. and we will titrate  that upwards since he has responded to both of these satisfactorily in the  past.  We also will make available to him Ativan 1 mg q.2 hours p.r.n.  either IM or p.o. for any severe agitation and we will consider the  possibility of limiting the phone contacts with his wife since this does  seem to be increasing his agitation at least in the short term.  We  attempted to discuss the treatment plan with him and he nods his head to  indicate understanding and has been agreeable to taking medications.  We  will continue to  discuss the treatment plan with him.  So far, he has been responsive and  directable with difficulty.  He is able to promise safety on the unit at  this time.   ESTIMATED LENGTH OF STAY:  7-10 days.     Margaret A. Scott, N.P.                   Geoffery Lyons, M.D.    MAS/MEDQ  D:  10/20/2002  T:  10/20/2002  Job:  [id number]

## 2011-01-24 NOTE — Discharge Summary (Signed)
[facility]  Patient:    [place], [place] A                          MRN: [id number] Adm. Date:  [id number] Disc. Date: [id number] Attending:  Benny Lennert CC:         [facility]   Discharge Summary  REASON FOR ADMISSION:  The patient was a 40 year old Caucasian male with a history of schizophrenia versus mood disorder.  He was admitted after having been committed for taking an overdose of Ativan.  He was stabilized at [facility] and left this hospital AMA.  Commitment papers were taken out and he was picked up by the police after going home.  The patient has been treated at the [facility], but states he has not been compliant.  For further psychiatric admission information, see psychiatric admission assessment.  PHYSICAL EXAMINATION:  Physical exam was done by [facility]  She found that he had a cast on his right lower forearm and reports pins in his metacarpals, secondary to an injury.  He is status post arthroscopic surgery, left knee.  He has a past history of a fractured collarbone.  There were no significant current medical problems.  ADMISSION LABORATORY:  The comprehensive metabolic panel was grossly within normal limits, except for an elevated glucose of 169.  AST/SGOT was 43 (0-37), ALT/SGPT was 48 (0-40).  CBC with differential was remarkable for an elevated WBC count of 11.4 (4-10.5).  Hypothyroid panel was within normal limits. Patient will be contacted about elevated serum glucose.  Urine drug screen: Benzodiazepines positive, otherwise negative.  HOSPITAL COURSE:  The patient stayed for brief crisis stabilization.  He was started on Claritin 10 mg q.d. due to complaints of itchiness of his eyes.  He also was started on Artificial Tears ophthalmic solution.  He stated this was not very helpful and it was discontinued  at the time of discharge.  On December 22, 2000, he was ordered Haldol 5 mg p.o. now, and Cogentin 1 mg p.o. now.  He refused both these medications.  He had been somewhat manic and was very agitated.  On December 22, 2000, he agreed to start Zyprexa, and this was begun at 5 mg p.o. b.i.d.  He tolerated this medication well with no significant side effects other than sedation.  He also stated he had been on Ativan 1 mg p.o. t.i.d. for his severe anxiety.  This was restarted at a dose of 0.5 mg p.o. t.i.d.  He stated this helped calm him down.  The patient had difficulty participating in unit therapeutic groups and activities because he was focused on when he would be able to go home.  He was concerned about his wife, who was pregnant, but had a very supportive family session with her. She requested that he be discharged and felt he was able to be safely managed at home.  DISCHARGE MENTAL STATUS EXAMINATION:  The patient was friendly and cooperative.  Eye contact improved.  Psychomotor activity was still increased. Speech was somewhat fast and pressured, but improved from admission level. Mood continued to be some irritability and manic symptoms.  Affect:  Able to reveal wide range.  There was no suicidal or homicidal ideation.  There was no self-injurious behavior.  There was no aggression.  There were no auditory or visual hallucinations.  No other psychosis.  Thought content had become much more logical and goal directed with the resolution of his flight of ideas. There still was some circumstantial thinking.  Cognitive exam was back to baseline.  Judgment fair, insight fair.  DISCHARGE DIAGNOSES: Axis I:    Schizoaffective disorder, mixed type. Axis II:   None. Axis III:  Fracture, right arm. Axis IV:   Severe. Axis V:    Current global assessment of functioning is 50, highest past year            65, admission global assessment of functioning 30.  DISCHARGE MEDICATIONS: 1. Zyprexa 10  mg at bedtime. 2. Ativan 0.5 mg p.o. t.i.d.  ACTIVITY LEVEL:  As tolerated with his cast on his arm.  DIET:  No restrictions.  POST HOSPITAL CARE PLAN:  The patient will return home to live with his wife. He will be followed in the [facility] Intensive Outpatient program. He will start on December 24, 2000 at 9 a.m. DD:  12/23/00 TD:  12/24/00 Job: [id number]

## 2011-01-24 NOTE — H&P (Signed)
[facility]  Patient:    [name], [name]                            MRN: [id number] Adm. Date:  12/19/00 Attending:  Terald Sleeper, M.D.                         History and Physical  IDENTIFICATION:  This is a 40 year old male unassigned patient of Dr. Farris Has.  CHIEF COMPLAINT:  Took 61 Ativan tonight with alcohol.  HISTORY OF PRESENT ILLNESS:  As provided by his wife.  Patient with known diagnosis of schizophrenia since age 56.  Last in hospital three or four years ago with diagnosis of the same.  No current antipsychotics (because the patient and his wife feel he does better without them); however, he has previously been on Prolixin.  Currently only on Ativan and Celexa.  Wife states the patient is under a large amount stress secondary to wifes prolonged treatment for leukemia and a recent discovery of being three months pregnant.  Wife denies any other medication access, other than Tylenol, in the house.  She discovered him stuporous with Ativan bottle not in its usual place tonight.  He has become more unresponsive since his arrival to the ER.  He had been drinking with a friend today; however, she states that he is not a habitual alcohol user.  He has received charcoal in the emergency room.  PAST MEDICAL HISTORY: 1. Schizophrenia. 2. Boxers fracture of the right hand. 3. No other history of diabetes, hypertension, or other medical illness.  MEDICATIONS: 1. Ativan 1 mg. 2. Celexa (1/2 tablet) presumably 10 mg q.d.  PHYSICAL EXAMINATION:  VITAL SIGNS:  Respiratory rate 16, pulse 97, blood pressure 112/62.  GENERAL:  Healthy-appearing 40 year old male in a light coma.  HEENT:  No meningeal signs.  Pupils were small but react.  RESPIRATORY:  Clear.  CARDIOVASCULAR:  Normal.  ABDOMEN:  Mildly distended, somewhat quiet.  CENTRAL NERVOUS SYSTEM:  He is unresponsive to voice but still withdraws to pain.  He is generally hyporeflexic.   There is a marked withdrawal response in both lower extremities to the Babinski.  SKIN:  No needle marks are noted.  EXTREMITIES:  Cast on the right arm.  No other obvious abnormalities.  LABORATORY DATA:  Laboratory work to date:  Urine drug screen is positive for BZDs only.  Basic metabolic panel shows a glucose of 94, potassium 3.1.  His total CO2 is 25.  Alcohol level is 53.  IMPRESSION: 1. Ativan and alcohol overdose, seems stable medically.  Mild coma, would    consider Romazicon if he develops decreased respirations and/or    hypotension. 2. He is not a habitual alcohol user, per his wife.  Therefore, I think his    risk of alcohol withdrawal is probably low. 3. Schizophrenia.  This is not actively being treated at present and I am not    sure whether this will be necessary in the short-term. 4. I was initially somewhat concerned about his decreased pupil size either    being suggestive of a neurologic injury or co-existent narcotic use;    however, on subsequent review this seems to be improving. 5. Hypokalemia.  I am really uncertain about the etiology here.  I would like    to review his other possible medication access with his wife and/or    co-existent medical illnesses.  PLAN:  He will be admitted, then given general support of care.  Romazicon will be reserved for increasing neurologic or respiratory instability.  We will get a psychiatry consult in the morning if he is awake enough to respond. DD:  12/19/00 TD:  12/19/00 Job: [id number]

## 2011-01-24 NOTE — Discharge Summary (Signed)
NAME:  [name], [name]                             ACCOUNT NO.:  [id number]   MEDICAL RECORD NO.:  [id number]                   PATIENT TYPE:  IPS   LOCATION:  0505                                 FACILITY:  [facility]   PHYSICIAN:  Geoffery Lyons, M.D.                   DATE OF BIRTH:  12-Oct-1970   DATE OF ADMISSION:  03/15/2004  DATE OF DISCHARGE:  03/25/2004                                 DISCHARGE SUMMARY   CHIEF COMPLAINT AND PRESENT ILLNESS:  This was one of several admissions to  [facility] for this 40 year old widowed white male  voluntarily admitted.  Presented with [name] history of auditory hallucinations.  They are calling him [name] dumb ass.  He was recently discharged from  [facility] [name] month prior to this admission.  He stopped taking the  medication due to sedation and then was also taking Sudafed to wake himself  back up.  He has been stressed with the amount of hours he has been working.  He has financial difficulties and recently widowed.  Denies any suicidal or  homicidal ideation.   PAST PSYCHIATRIC HISTORY:  Multiple admissions; June 23rd last admission,  overdosed on Zyprexa.   ALCOHOL/DRUG HISTORY:  Drinks beer occasionally and denies any other drug  use.   PAST MEDICAL HISTORY:  Problems with his knee.   MEDICATIONS:  Seroquel 200 mg at night and 50 mg twice [name] day.  Noncompliant.   PHYSICAL EXAMINATION:  Performed and failed to show any acute findings.   LABORATORY DATA:  Urine drug screen negative for substances of abuse.  CBC  within normal limits.  CMET within normal limits.  No acute distress.   MENTAL STATUS EXAM:  Alert male.  Cooperative.  Fair eye contact.  Speech  was rapid.  The patient was anxious.  Does have [name] sense of humor.  Provides  most logical answers.  Thought process aware of self and situation with  evidence of some underlying paranoia.  Positive for auditory hallucinations.  Insight not present.   ADMISSION DIAGNOSES:   AXIS I:  Schizoaffective disorder.   AXIS II:  No diagnosis.   AXIS III:  No diagnosis.   AXIS IV:  Moderate.   AXIS V:  Global Assessment of Functioning upon admission 25-30; highest  Global Assessment of Functioning in the last year 65-70.   HOSPITAL COURSE:  He was admitted and started individual and group  psychotherapy.  We went ahead and did some Ambien.  Initially on Seroquel,  up to 25 mg twice [name] day and 200 mg at night.  Given the fact that he was so  sedated on Seroquel that he could not tolerate any further increase, we went  ahead and switched to Geodon that he tolerated well.  We went up to Geodon  40 mg in the morning and 80 mg at night.  His  sleep was more stable.  He was  not sleepy during the day.  He was more active and involved.  He did report  decrease in the voices.  Was comfortable with the Geodon and did say he was  going to stay on it.  On March 25, 2004, he was in full contact with reality.  Mood was better.  Affect was brighter, broad.  Decrease in hallucinations.  Tolerating the Geodon well.  No suicidal or homicidal ideation.  Wanting to  be discharged.  Was going to go back and stay with his mother and go back to  work, hopefully trying to limit the number of hours that he was going to  start working.   DISCHARGE DIAGNOSES:   AXIS I:  Schizoaffective disorder.   AXIS II:  No diagnosis.   AXIS III:  No diagnosis.   AXIS IV:  Moderate.   AXIS V:  Global Assessment of Functioning upon discharge 55-60.   DISCHARGE MEDICATIONS:  1. Geodon 40 mg in the morning and 80 mg at night.  2. Ambien 10 mg at bedtime for sleep.   FOLLOW UP:  Dr. Lolly Mustache on April 08, 2004 at [facility].                                               Geoffery Lyons, M.D.    IL/MEDQ  D:  04/17/2004  T:  04/18/2004  Job:  [id number]

## 2011-01-24 NOTE — Op Note (Signed)
NAME:  [name], [name]                               ACCOUNT NO.:  [id number]   MEDICAL RECORD NO.:  [id number]                   PATIENT TYPE:   LOCATION:                                       FACILITY:   PHYSICIAN:  Pearletha Alfred, M.D.                DATE OF BIRTH:   DATE OF PROCEDURE:  DATE OF DISCHARGE:                                 OPERATIVE REPORT   PREOPERATIVE DIAGNOSIS:  Fractured fifth metacarpal shaft with significant  angulation.   POSTOPERATIVE DIAGNOSIS:  Fractured fifth metacarpal shaft with significant  angulation.   OPERATION:  Intramedullary rod fixation using invasive intramedullary fifth  metacarpal right hand.   SURGEON:  Nicki Reaper, M.D.   ANESTHESIA:  Axillary.  General.   DATE OF OPERATION:  04/22/02.   ANESTHESIOLOGIST:  Guadalupe Maple, M.D.   HISTORY:  The patient is a 40 year old male who suffered a fracture of his  fifth metacarpal when he punched a wall.  He has a fracture of the mid shaft  with approximately 50 degrees of angulation.  He is admitted for reduction  and pin fixation with an IM rod.  The patient is brought to the operating  room where an axillary block was carried out without difficulty.  He had  continued feeling with attempted reduction.  General anesthetic was given.  He was prepped and draped using Betadine scrubbing solution with the right  arm free.  The limb was exsanguinated with an Esmarch bandage, tourniquet  placed high on the arm and was inflated to 250 mmHg.  An IM rod was then  placed after localization with an OEZ intensifier.  An incision made at the  base of the fifth metacarpal.  The trocar and rod were used to enter the  base of the fifth metacarpal.  The rod was then passed distally securing  this into the distal fragment.  The fracture as reduced.  The rod crossed  the fracture site maintaining it in position.  The rod was then bent.  In  bending it, however, it pulled the rod slightly back.  This was then  cut  beneath the skin.  The wound was irrigated and skin closed with interrupted  5-0 nylon suture.  A sterile compressive dressing and splint was applied.  The patient tolerated the procedure well and was taken to the recovery room  for observation in satisfactory condition.   He is discharged home to return to the [facility] [place] in one week  on Vicodin and Keflex.                                               Pearletha Alfred, M.D.    RBG/MEDQ  D:  04/22/2002  T:  04/26/2002  Job:  [id number]

## 2011-01-24 NOTE — Consult Note (Signed)
   NAME:  [name] [name], [name] [name]                             ACCOUNT NO.:  [id number]   MEDICAL RECORD NO.:  [id number]                   PATIENT TYPE:  EMS   LOCATION:  ED                                   FACILITY:  [facility]   PHYSICIAN:  Excell Seltzer. Annabell Howells, M.D.                 DATE OF BIRTH:  December 19, 1970   DATE OF CONSULTATION:  DATE OF DISCHARGE:  06/04/2002                                   CONSULTATION   CHIEF COMPLAINT:  I have [name] foreign body in my urethra.   HISTORY OF PRESENT ILLNESS:  This is [name] 40 year old white male with [name] history  of paranoid schizophrenia who placed about [name] 4 inch plastic tubing in his  urethra for erotic purposes and was unable to retrieve it.   PAST MEDICAL HISTORY:  Pertinent for paranoid schizophrenia.   CURRENT MEDICATIONS:  He is on Zyprexa but has not been compliant in the  last three days.   SOCIAL HISTORY:  Positive for tobacco. His wife is currently in the hospital  at [facility] for an impending bone marrow transplant for leukemia.   FAMILY HISTORY:  Noncontributory.   REVIEW OF SYSTEMS:  Currently denies any auditory hallucinations or paranoid  delusions which are the hallmarks of his paranoid schizophrenia. He does  report bladder and penile pain and diminished urinary stream. He is  otherwise without complaint.   PHYSICAL EXAMINATION:  VITAL SIGNS: Temperature 97.5, pulse 95, respiratory  rate 20, blood pressure 142/94.  GENERAL: Anxious white male in no acute distress. Alert and oriented times  three.  ABDOMEN: Soft and flat without mass or tenderness.  GU: Circumcised phallus with adequate meatus. There is no penile tenderness  that is palpable. Testicles are bilaterally distended and normal in size and  consistency without mass or tenderness. Epididymis unremarkable. No inguinal  hernias are noted.   DIAGNOSTIC STUDIES:  KUB was obtained which revealed [name] foreign body in the  bladder.   PLAN:  Removal of foreign body.                           Excell Seltzer. Annabell Howells, M.D.    JJW/MEDQ  D:  06/05/2002  T:  06/06/2002  Job:  [id number]
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
## 2011-01-24 NOTE — Discharge Summary (Signed)
NAME:  [name], [name]                             ACCOUNT NO.:  [id number]   MEDICAL RECORD NO.:  [id number]                   PATIENT TYPE:  IPS   LOCATION:  0303                                 FACILITY:  [facility]   PHYSICIAN:  Reymundo Poll. [name], M.D.                DATE OF BIRTH:  [birth year]   DATE OF ADMISSION:  03/08/2002  DATE OF DISCHARGE:  03/14/2002                                 DISCHARGE SUMMARY   CHIEF COMPLAINT AND PRESENT ILLNESS:  This was the third admission to [facility] for this 40 year old male voluntarily admitted due to  suicidal ideation.  History of depression and suicidal thoughts.  Reported  stress over his wife's illness, who is presently in the hospital.  His wife  has leukemia.  Been taking care of his 69-month-old son.  Never spent more  than an hour taking care of him totally.  Very stressed over this situation.  Began hearing auditory hallucinations making comments about things that he  does.  Having positive suicidal thoughts to jump off [name] bridge.   PAST PSYCHIATRIC HISTORY:  Third time at [facility].  Dr.  Hortencia Pilar for follow-up.   ALCOHOL/DRUG HISTORY:  Denies the use or abuse of any alcohol or drugs.   MEDICATIONS:  He was taking Trilafon 4 mg at bedtime, Prevacid 1 mg in the  morning, BuSpar 15 mg twice [name] day, Zanaflex and Zyprexa at bedtime.   PHYSICAL EXAMINATION:  Performed and failed to show any acute findings.   MENTAL STATUS EXAM:  Alert, oriented, casually dressed male.  Cooperative  with good eye contact.  Speech is clear, somewhat hyperverbal.  Mood is  depressed and anxious.  Affect is anxious.  Thought processes reporting  auditory hallucinations, does not appear to be responding to hallucinations  upon the interview.  Thought processes were organized and goal directed.  No  suicidal or homicidal ideation.  No visual hallucinations.  Cognition well-  preserved.   ADMISSION DIAGNOSES:   AXIS I:  Rule out  major depression with psychosis versus psychotic disorder  not otherwise specified.   AXIS II:  No diagnosis.   AXIS III:  No diagnosis.   AXIS IV:  Moderate.   AXIS V:  Global Assessment of Functioning upon admission 35; highest Global  Assessment of Functioning in the last year 70.   HOSPITAL COURSE:  He was admitted and started intensive individual and group  psychotherapy.  His medications were modified as we went along.  The  Trilafon was discontinued.  He was given Seroquel 25 mg twice [name] day and 50  mg at bedtime.  He continued to complain of auditory hallucinations, could  not sleep.  Seroquel was increased to 100 mg at bedtime.  It was further  increased to 25 mg as needed every six hours and 150 mg at bedtime, then 200  mg  and then 300 mg.  The hospitalization was characterized by his wife  calling him and putting pressure on him to be discharged as she was in the  hospital and she was wanting him to be there for her.  He has been on  multiple medications in the past, Prolixin, Trilafon, Zyprexa, Risperdal,  Clozaril with side effects, so we started with the Seroquel and continued  working on it.  He was able to deal in group and individually with  everything that was going on.  The main concern was the auditory  hallucinations but he denied any suicidal ideation.  On March 14, 2002,  although still hearing voices, he wanted to be discharged home and continue  outpatient follow-up because he felt that, if he was to stay in the  hospital, he was going to feel worse.  He wanted to be available to his  wife.  As he was not suicidal or homicidal and he was willing to pursue  outpatient treatment, we discharged him to outpatient follow-up.   DISCHARGE DIAGNOSES:   AXIS I:  1. Major depression, recurrent.  2. Psychotic disorder not otherwise specified.   AXIS II:  No diagnosis.   AXIS III:  No diagnosis.   AXIS IV:  Moderate.   AXIS V:  Global Assessment of Functioning  upon discharge 55.   DISCHARGE MEDICATIONS:  1. Prevacid 30 mg daily.  2. BuSpar 10 mg twice [name] day.  3. Afrin spray twice [name] day.  4. Seroquel 300 mg at bedtime.  5. Ativan 0.5 mg every six hours as needed for anxiety.  6. Seroquel 100 mg, 1/2 twice [name] day.   FOLLOW UP:  Mental health center.                                               Madie Reno [name]. [name], M.D.    IAL/MEDQ  D:  04/13/2002  T:  04/19/2002  Job:  [id number]

## 2011-03-10 ENCOUNTER — Ambulatory Visit (INDEPENDENT_AMBULATORY_CARE_PROVIDER_SITE_OTHER): Payer: Medicare Other | Admitting: Psychiatry

## 2011-03-10 DIAGNOSIS — F259 Schizoaffective disorder, unspecified: Secondary | ICD-10-CM

## 2011-06-23 LAB — I-STAT 8, (EC8 V) (CONVERTED LAB)
Acid-Base Excess: 4 — ABNORMAL HIGH
BUN: 6
Chloride: 102
Glucose, Bld: 116 — ABNORMAL HIGH
Potassium: 4.4
pCO2, Ven: 35.6 — ABNORMAL LOW
pH, Ven: 7.491 — ABNORMAL HIGH

## 2011-06-23 LAB — DIFFERENTIAL
Basophils Absolute: 0.1
Basophils Relative: 2 — ABNORMAL HIGH
Eosinophils Absolute: 0.4
Eosinophils Relative: 5
Metamyelocytes Relative: 0
Myelocytes: 0
Neutrophils Relative %: 52
Promyelocytes Absolute: 0

## 2011-06-23 LAB — ANAEROBIC CULTURE

## 2011-06-23 LAB — CBC
HCT: 37.8 — ABNORMAL LOW
MCHC: 34.2
MCV: 92.2
RBC: 4.1 — ABNORMAL LOW
WBC: 7

## 2011-06-23 LAB — SYNOVIAL CELL COUNT + DIFF, W/ CRYSTALS
Crystals, Fluid: NONE SEEN
Eosinophils-Synovial: 0
WBC, Synovial: 4222 — ABNORMAL HIGH

## 2011-06-23 LAB — CULTURE, BLOOD (ROUTINE X 2): Culture: NO GROWTH

## 2011-06-23 LAB — GRAM STAIN

## 2011-06-23 LAB — BODY FLUID CULTURE

## 2011-10-30 ENCOUNTER — Emergency Department (HOSPITAL_COMMUNITY): Payer: Medicare Other

## 2011-10-30 ENCOUNTER — Encounter (HOSPITAL_COMMUNITY): Payer: Self-pay | Admitting: *Deleted

## 2011-10-30 ENCOUNTER — Emergency Department (HOSPITAL_COMMUNITY)
Admission: EM | Admit: 2011-10-30 | Discharge: 2011-10-30 | Disposition: A | Discharge status: Home Health | Payer: Medicare Other | Attending: Emergency Medicine | Admitting: Emergency Medicine

## 2011-10-30 DIAGNOSIS — J069 Acute upper respiratory infection, unspecified: Secondary | ICD-10-CM

## 2011-10-30 DIAGNOSIS — R490 Dysphonia: Secondary | ICD-10-CM | POA: Insufficient documentation

## 2011-10-30 DIAGNOSIS — J3489 Other specified disorders of nose and nasal sinuses: Secondary | ICD-10-CM | POA: Insufficient documentation

## 2011-10-30 DIAGNOSIS — J029 Acute pharyngitis, unspecified: Secondary | ICD-10-CM | POA: Insufficient documentation

## 2011-10-30 DIAGNOSIS — G43909 Migraine, unspecified, not intractable, without status migrainosus: Secondary | ICD-10-CM | POA: Insufficient documentation

## 2011-10-30 HISTORY — DX: Schizoaffective disorder, unspecified: F25.9

## 2011-10-30 HISTORY — DX: Dorsalgia, unspecified: M54.9

## 2011-10-30 HISTORY — DX: Other chronic pain: G89.29

## 2011-10-30 HISTORY — DX: Schizoaffective disorder, bipolar type: F25.0

## 2011-10-30 HISTORY — DX: Migraine, unspecified, not intractable, without status migrainosus: G43.909

## 2011-10-30 LAB — RAPID STREP SCREEN (MED CTR MEBANE ONLY): Streptococcus, Group A Screen (Direct): NEGATIVE

## 2011-10-30 MED ORDER — NYSTATIN 100000 UNIT/ML MT SUSP: 500000.0000 [IU] | Freq: Four times a day (QID) | OROMUCOSAL | Status: AC

## 2011-10-30 NOTE — ED Notes (Signed)
Patient walked out to nurses station and stated he needed "to go to vending machine to get [facility] to take my Xanax". When asked if he took he stated "just now so I need the Dew". MD made aware and patient given Coke.

## 2011-10-30 NOTE — ED Provider Notes (Signed)
History     CSN: [id number]  Arrival date & time 10/30/11  0447   First MD Initiated Contact with Patient 10/30/11 [phone]      Chief Complaint  Patient presents with  . Nasal Congestion  . Migraine  . Sore Throat    (Consider location/radiation/quality/duration/timing/severity/associated sxs/prior treatment) HPI  Pt presents to the ED with complaints of migraine, nasal congestion and raspy throat for 1 month. He has been on loratadine and doxycycline but states it has not been helping. He says that do to his Prolixin he is prone to getting Thrush and that is waht he believes he has is a fungal infection in his throat as he has had this before more than once and was treated with PO lamasil or PO diflucan. Pt denies having cough, except for when he is coughing up nasal congestion. Pt denies being immunocompromised.   Past Medical History  Diagnosis Date  . Migraines   . Schizophrenia, schizo-affective   . Chronic back pain     History reviewed. No pertinent past surgical history.  History reviewed. No pertinent family history.  History  Substance Use Topics  . Smoking status: Current Everyday Smoker  . Smokeless tobacco: Not on file  . Alcohol Use: No      Review of Systems  All other systems reviewed and are negative.    Allergies  Guaifenesin; Nsaids; and Quetiapine  Home Medications   Current Outpatient Rx  Name Route Sig Dispense Refill  . ALPRAZOLAM 1 MG PO TABS Oral Take 1 mg by mouth 5 (five) times daily.    Marland Kitchen DOXYCYCLINE HYCLATE 100 MG PO TABS Oral Take 100 mg by mouth 2 (two) times daily.    Marland Kitchen FLUPHENAZINE HCL 5 MG PO TABS Oral Take 20 mg by mouth at bedtime.    Marland Kitchen HYDROCODONE-ACETAMINOPHEN 10-325 MG PO TABS Oral Take 1 tablet by mouth every 4 (four) hours as needed. For pain      BP 123/85  Pulse 80  Temp(Src) 97.8 F (36.6 C) (Oral)  Resp 20  Ht 6\' 1"  (1.854 m)  Wt 224 lb (101.606 kg)  BMI 29.55 kg/m2  SpO2 95%  Physical Exam  Nursing note  and vitals reviewed. Constitutional: He is oriented to person, place, and time. He appears well-developed and well-nourished.  HENT:  Head: Normocephalic and atraumatic.  Right Ear: External ear normal.  Left Ear: External ear normal.  Nose: Rhinorrhea present.  Mouth/Throat: Oropharynx is clear and moist.  Eyes: Conjunctivae are normal. Pupils are equal, round, and reactive to light.  Neck: Normal range of motion.  Cardiovascular: Normal rate and regular rhythm.   Pulmonary/Chest: Effort normal and breath sounds normal.  Abdominal: Soft.  Musculoskeletal: Normal range of motion.  Neurological: He is oriented to person, place, and time.  Skin: Skin is warm and dry.    ED Course  Procedures (including critical care time)   Labs Reviewed  RAPID STREP SCREEN   No results found.   No diagnosis found.    MDM   End of shift care handed over to [facility]. Pt still awaiting chest xray.     Dorthula Matas, PA 10/30/11 [phone]

## 2011-10-30 NOTE — ED Provider Notes (Signed)
Medical screening examination/treatment/procedure(s) were performed by non-physician practitioner and as supervising physician I was immediately available for consultation/collaboration.   Celene Kras, MD 10/30/11 [phone]

## 2011-10-30 NOTE — ED Provider Notes (Signed)
Chest x-ray is negative. The patient is ambulatory, oriented, coherent. Will give Rx for Nystatin as requested by patient for stated history of thrush esophagitis with similar symptoms and negative work up otherwise.   Rodena Medin, PA-C 10/30/11 [phone]

## 2011-10-30 NOTE — ED Provider Notes (Signed)
Medical screening examination/treatment/procedure(s) were performed by non-physician practitioner and as supervising physician I was immediately available for consultation/collaboration.    Ahmaad Neidhardt R Traivon Morrical, MD 10/30/11 0633 

## 2011-10-30 NOTE — ED Notes (Signed)
Pt reports runny nose, migraine, and raspy throat. States was dx with fungal infection in throat by PCP and was given rx for doxy. Doesn't know if he has had any fevers.

## 2011-10-30 NOTE — Discharge Instructions (Signed)
FOLLOW UP WITH YOUR DOCTOR FOR RECHECK TODAY OR TOMORROW. RETURN HERE AS NEEDED. CONTINUE TYLENOL FOR ANY FEVER, PUSH FLUIDS. USE NYSTATIN AS DIRECTED AND SEE DR. Andrey Campanile IF NO BETTER.  Antibiotic Nonuse  Your caregiver felt that the infection or problem was not one that would be helped with an antibiotic. Infections may be caused by viruses or bacteria. Only a caregiver can tell which one of these is the likely cause of an illness. A cold is the most common cause of infection in both adults and children. A cold is a virus. Antibiotic treatment will have no effect on a viral infection. Viruses can lead to many lost days of work caring for sick children and many missed days of school. Children may catch as many as 10 "colds" or "flus" per year during which they can be tearful, cranky, and uncomfortable. The goal of treating a virus is aimed at keeping the ill person comfortable. Antibiotics are medications used to help the body fight bacterial infections. There are relatively few types of bacteria that cause infections but there are hundreds of viruses. While both viruses and bacteria cause infection they are very different types of germs. A viral infection will typically go away by itself within 7 to 10 days. Bacterial infections may spread or get worse without antibiotic treatment. Examples of bacterial infections are:  Sore throats (like strep throat or tonsillitis).   Infection in the lung (pneumonia).   Ear and skin infections.  Examples of viral infections are:  Colds or flus.   Most coughs and bronchitis.   Sore throats not caused by Strep.   Runny noses.  It is often best not to take an antibiotic when a viral infection is the cause of the problem. Antibiotics can kill off the helpful bacteria that we have inside our body and allow harmful bacteria to start growing. Antibiotics can cause side effects such as allergies, nausea, and diarrhea without helping to improve the symptoms of the  viral infection. Additionally, repeated uses of antibiotics can cause bacteria inside of our body to become resistant. That resistance can be passed onto harmful bacterial. The next time you have an infection it may be harder to treat if antibiotics are used when they are not needed. Not treating with antibiotics allows our own immune system to develop and take care of infections more efficiently. Also, antibiotics will work better for [place] when they are prescribed for bacterial infections. Treatments for a child that is ill may include:  Give extra fluids throughout the day to stay hydrated.   Get plenty of rest.   Only give your child over-the-counter or prescription medicines for pain, discomfort, or fever as directed by your caregiver.   The use of a cool mist humidifier may help stuffy noses.   Cold medications if suggested by your caregiver.  Your caregiver may decide to start you on an antibiotic if:  The problem you were seen for today continues for a longer length of time than expected.   You develop a secondary bacterial infection.  SEEK MEDICAL CARE IF:  Fever lasts longer than 5 days.   Symptoms continue to get worse after 5 to 7 days or become severe.   Difficulty in breathing develops.   Signs of dehydration develop (poor drinking, rare urinating, dark colored urine).   Changes in behavior or worsening tiredness (listlessness or lethargy).  Document Released: 11/03/2001 Document Revised: 05/07/2011 Document Reviewed: 05/02/2009 [facility] Patient Information 2012 [place], [place].

## 2012-05-24 ENCOUNTER — Emergency Department (HOSPITAL_COMMUNITY): Payer: Medicare Other

## 2012-05-24 ENCOUNTER — Emergency Department (HOSPITAL_COMMUNITY)
Admission: EM | Admit: 2012-05-24 | Discharge: 2012-05-24 | Disposition: A | Discharge status: Home / Self Care | Payer: Medicare Other | Attending: Emergency Medicine | Admitting: Emergency Medicine

## 2012-05-24 ENCOUNTER — Encounter (HOSPITAL_COMMUNITY): Payer: Self-pay | Admitting: *Deleted

## 2012-05-24 DIAGNOSIS — F209 Schizophrenia, unspecified: Secondary | ICD-10-CM | POA: Insufficient documentation

## 2012-05-24 DIAGNOSIS — F17200 Nicotine dependence, unspecified, uncomplicated: Secondary | ICD-10-CM | POA: Insufficient documentation

## 2012-05-24 DIAGNOSIS — M549 Dorsalgia, unspecified: Secondary | ICD-10-CM | POA: Insufficient documentation

## 2012-05-24 DIAGNOSIS — G8929 Other chronic pain: Secondary | ICD-10-CM | POA: Insufficient documentation

## 2012-05-24 MED ORDER — OXYCODONE-ACETAMINOPHEN 5-325 MG PO TABS
2.0000 | ORAL_TABLET | ORAL | Status: DC | PRN
Start: 2012-05-24 — End: 2013-06-22

## 2012-05-24 NOTE — ED Notes (Signed)
The pt has chronic back pain and he reports that he fell on his back last pm

## 2012-05-24 NOTE — ED Notes (Signed)
Patient transported to X-ray 

## 2012-05-24 NOTE — ED Provider Notes (Signed)
History  This chart was scribed for Richardean Canal, MD by Bennett Scrape. This patient was seen in room TR10C/TR10C and the patient's care was started at 1826.  CSN: [id number]  Arrival date & time 05/24/12  1510   First MD Initiated Contact with Patient 05/24/12 1826      Chief Complaint  Patient presents with  . Back Pain    The history is provided by the patient. No language interpreter was used.    [place] A Greenhaw is a 41 y.o. male with a h/o chronic back pain who presents to the Emergency Department complaining of constant lower back pain described as stabbing after a fall last night. Pt states that he tripped backwards over a chair while taking the trash out. The pain is worse with changing positions and movement. He states that he usually takes Norco 10-325 MG daily for his pain but reports that it has not improved it this time. 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 He is requesting an MRI today. He denies loss of bowels or bladder, nausea, emesis, rash, fevers and chills as associated symptoms. He also has a h/o schizophrenia and migraines. He is a current everyday smoker but denies alcohol use.  Past Medical History  Diagnosis Date  . Migraines   . Schizophrenia, schizo-affective   . Chronic back pain     History reviewed. No pertinent past surgical history.  No family history on file.  History  Substance Use Topics  . Smoking status: Current Every Day Smoker  . Smokeless tobacco: Not on file  . Alcohol Use: No      Review of Systems  Constitutional: Negative for fever and chills.  Gastrointestinal: Negative for nausea and vomiting.  Musculoskeletal: Positive for back pain.  Skin: Negative for rash.    Allergies  Guaifenesin; Nsaids; and Quetiapine  Home Medications   Current Outpatient Rx  Name Route Sig Dispense Refill  . ALPRAZOLAM 1 MG PO TABS Oral Take 1 mg by mouth 5 (five) times daily.      Marland Kitchen FLUPHENAZINE HCL 5 MG PO TABS Oral Take 15 mg by mouth at bedtime.     Marland Kitchen HYDROCODONE-ACETAMINOPHEN 10-325 MG PO TABS Oral Take 1 tablet by mouth 3 (three) times daily as needed. For pain    . OMEPRAZOLE 40 MG PO CPDR Oral Take 40 mg by mouth daily.    . OXYCODONE-ACETAMINOPHEN 5-325 MG PO TABS Oral Take 2 tablets by mouth every 4 (four) hours as needed for pain. 15 tablet 0  . OXYCODONE-ACETAMINOPHEN 5-325 MG PO TABS Oral Take 2 tablets by mouth every 4 (four) hours as needed for pain. 15 tablet 0    Triage vitals: BP 134/81  Pulse 89  Temp 97.9 F (36.6 C) (Oral)  Resp 18  SpO2 95%  Physical Exam  Nursing note and vitals reviewed. Constitutional: He is oriented to person, place, and time. He appears well-developed and well-nourished. No distress.  HENT:  Head: Normocephalic and atraumatic.  Eyes: EOM are normal.  Neck: Neck supple. No tracheal deviation present.  Cardiovascular: Normal rate.   Pulmonary/Chest: Effort normal. No respiratory distress.  Musculoskeletal: Normal range of motion.       No midline tenderness, negative straight leg raise  Neurological: He is alert and oriented to person, place, and time.       No saddle anesthesia, normal strength in all extremities   Skin: Skin is  warm and dry.  Psychiatric: He has a normal mood and affect. His behavior is normal.    ED Course  Procedures (including critical care time)  DIAGNOSTIC STUDIES: Oxygen Saturation is 95% on room air, adequate by my interpretation.    COORDINATION OF CARE: 1832-Discussed discharge plan which includes Norco and Vicodin with pt at bedside and pt agreed to plan. Advised pt to follow up with his PCP. Pt drove himself here.  1845-Pt decided that he wanted the x-ray.  1916-Pt rechecked and is resting comfortably. Informed pt of negative x-ray. Re-discussed discharge plan with pt at bedside and pt agreed to plan.  Labs Reviewed - No data to display Dg Lumbar Spine Complete  05/24/2012   *RADIOLOGY REPORT*  Clinical Data: Fall  LUMBAR SPINE - COMPLETE 4+ VIEW  Comparison: 11/13/2010  Findings: Negative for fracture.  Normal alignment.  Mild disc space narrowing with early spurring L3-4 and L4-5.  No pars defect.  IMPRESSION: Negative for fracture.   Original Report Authenticated By: Camelia Phenes, M.D.      1. Back pain       MDM  [place] A Lopezramirez is a 41 y.o. male hx of chronic back pain here with worsening pain s/p fall. Xray showed no fracture. I discussed with patient that he doesn't need emergent MRI and that he can get it outpatient with his doctor. Patient given a prescription of percocet and will f/u outpatient.          Richardean Canal, MD 05/24/12 Jerene Bears

## 2012-05-24 NOTE — ED Notes (Signed)
Pt returned from xray. Waiting for xray results.

## 2012-06-10 ENCOUNTER — Other Ambulatory Visit: Payer: Self-pay | Admitting: Family Medicine

## 2012-06-10 DIAGNOSIS — M545 Low back pain, unspecified: Secondary | ICD-10-CM

## 2012-06-16 ENCOUNTER — Other Ambulatory Visit: Payer: Medicare Other

## 2012-06-22 ENCOUNTER — Inpatient Hospital Stay: Admission: RE | Admit: 2012-06-22 | Payer: Medicare Other | Source: Ambulatory Visit

## 2012-06-23 ENCOUNTER — Other Ambulatory Visit: Payer: Medicare Other

## 2013-06-22 ENCOUNTER — Emergency Department (HOSPITAL_COMMUNITY)
Admission: EM | Admit: 2013-06-22 | Discharge: 2013-06-22 | Disposition: A | Discharge status: Home / Self Care | Payer: Medicare HMO | Attending: Emergency Medicine | Admitting: Emergency Medicine

## 2013-06-22 ENCOUNTER — Encounter (HOSPITAL_COMMUNITY): Payer: Self-pay | Admitting: Emergency Medicine

## 2013-06-22 DIAGNOSIS — Z7982 Long term (current) use of aspirin: Secondary | ICD-10-CM | POA: Insufficient documentation

## 2013-06-22 DIAGNOSIS — Z8659 Personal history of other mental and behavioral disorders: Secondary | ICD-10-CM | POA: Insufficient documentation

## 2013-06-22 DIAGNOSIS — F17200 Nicotine dependence, unspecified, uncomplicated: Secondary | ICD-10-CM | POA: Insufficient documentation

## 2013-06-22 DIAGNOSIS — G8929 Other chronic pain: Secondary | ICD-10-CM | POA: Insufficient documentation

## 2013-06-22 DIAGNOSIS — H109 Unspecified conjunctivitis: Secondary | ICD-10-CM | POA: Insufficient documentation

## 2013-06-22 DIAGNOSIS — Z8679 Personal history of other diseases of the circulatory system: Secondary | ICD-10-CM | POA: Insufficient documentation

## 2013-06-22 DIAGNOSIS — Z79899 Other long term (current) drug therapy: Secondary | ICD-10-CM | POA: Insufficient documentation

## 2013-06-22 MED ORDER — ERYTHROMYCIN 5 MG/GM OP OINT: TOPICAL_OINTMENT | OPHTHALMIC | Status: DC

## 2013-06-22 MED ORDER — FLUORESCEIN SODIUM 1 MG OP STRP
1.0000 | ORAL_STRIP | Freq: Once | OPHTHALMIC | Status: AC
  Administered 2013-06-22: 1 via OPHTHALMIC
  Filled 2013-06-22: qty 1

## 2013-06-22 MED ORDER — POLYMYXIN B-TRIMETHOPRIM 10000-0.1 UNIT/ML-% OP SOLN: OPHTHALMIC | Status: DC

## 2013-06-22 MED ORDER — TETRACAINE HCL 0.5 % OP SOLN
1.0000 [drp] | Freq: Once | OPHTHALMIC | Status: AC
  Administered 2013-06-22: 1 [drp] via OPHTHALMIC
  Filled 2013-06-22: qty 2

## 2013-06-22 NOTE — ED Notes (Signed)
Pt st's he was grinding a rock approx 7-10 days ago and a piece of the rock went into both eyes.  St's he then mowed his neighbors yard and got dirt into both eyes.  Pt c/o both eyes feeling irritated but left eye is worse than right.

## 2013-06-22 NOTE — ED Provider Notes (Signed)
CSN: [id number]     Arrival date & time 06/22/13  1645 History  This chart was scribed for non-physician practitioner Coral Ceo, PA-C, working with Flint Melter, MD by Dorothey Baseman, ED Scribe. This patient was seen in room TR04C/TR04C and the patient's care was started at 7:18 PM.    Chief Complaint  Patient presents with  . Conjunctivitis   The history is provided by the patient. No language interpreter was used.   HPI Comments: [name] is a 43 y.o. Male with a PMH of migraines, chronic back pain, and schizophrenia who presents to the Emergency Department complaining of left eye pain.  About 7-10 days ago, patient reports that he was grinding a stone and foreign bodies got into his eyes bilaterally. He reports associated eye pain that feels like a retained foreign body. He also states that he may have gotten dirt in his right eye about 1 week ago while mowing his lawn.  Today he reports an eye discomfort with the left greater than the right.  He reports associated redness, crust in the bilateral eyes upon waking, and a clear drainage. Patient also reports blurred vision to his left eye that he has experienced in the past, but that it is worse than usual. Patient reports sleep disturbance that is normal for him. Patient reports that he has an optometrist for annual exams, but that his insurance would not cover an encounter for his current complaints and that he was advised to come to the ED. He denies wearing glasses or contacts. Patient reports a history of chronic dry eye. He denies eye itching, fever, chills, and photophobia.  He otherwise has been well with no rhinorrhea, congestion, sore throat, ear pain, cough, CP, SOB, abdominal pain, nausea, emesis, diarrhea, constipation, and leg edema.     Past Medical History  Diagnosis Date  . Migraines   . Schizophrenia, schizo-affective   . Chronic back pain    History reviewed. No pertinent past surgical history. No family history on  file. History  Substance Use Topics  . Smoking status: Current Every Day Smoker -- 1.00 packs/day    Types: Cigarettes  . Smokeless tobacco: Not on file  . Alcohol Use: No    Review of Systems  Constitutional: Negative for fever, chills, activity change and appetite change.  HENT: Negative for congestion, ear pain, rhinorrhea, sore throat and tinnitus.   Eyes: Positive for pain, discharge, redness and visual disturbance. Negative for photophobia and itching.  Respiratory: Negative for cough and shortness of breath.   Cardiovascular: Negative for chest pain and leg swelling.  Gastrointestinal: Negative for nausea, vomiting, abdominal pain, diarrhea and constipation.  Genitourinary: Negative for dysuria.  Musculoskeletal: Negative for back pain and gait problem.  Skin: Negative for color change and wound.  Neurological: Negative for dizziness, weakness, light-headedness, numbness and headaches.  Psychiatric/Behavioral: Positive for sleep disturbance ( normal for him).  All other systems reviewed and are negative.    Allergies  Nsaids and Quetiapine  Home Medications   Current Outpatient Rx  Name  Route  Sig  Dispense  Refill  . ALPRAZolam (XANAX) 1 MG tablet   Oral   Take 1 mg by mouth 5 (five) times daily.         Marland Kitchen aspirin 325 MG tablet   Oral   Take 325 mg by mouth daily.         . cycloSPORINE (RESTASIS) 0.05 % ophthalmic emulsion   Both Eyes   Place 1  drop into both eyes 2 (two) times daily.         . fluPHENAZine (PROLIXIN) 5 MG tablet   Oral   Take 20 mg by mouth at bedtime.          Marland Kitchen HYDROcodone-acetaminophen (NORCO) 10-325 MG per tablet   Oral   Take 1 tablet by mouth 3 (three) times daily as needed. For pain         . omeprazole (PRILOSEC) 40 MG capsule   Oral   Take 40 mg by mouth daily.          Triage Vitals: BP 131/84  Pulse 79  Temp(Src) 98 F (36.7 C) (Oral)  Resp 18  Ht 6\' 1"  (1.854 m)  Wt 218 lb (98.884 kg)  BMI 28.77 kg/m2   SpO2 97%  Filed Vitals:   06/22/13 1709 06/22/13 2009  BP: 131/84 130/87  Pulse: 79 66  Temp: 98 F (36.7 C)   TempSrc: Oral   Resp: 18   Height: 6\' 1"  (1.854 m)   Weight: 218 lb (98.884 kg)   SpO2: 97% 98%     Physical Exam  Nursing note and vitals reviewed. Constitutional: He is oriented to person, place, and time. He appears well-developed and well-nourished. No distress.  Patient appears restless, leaving his room several times while waiting for the provider and asking multiple staff questions about his care.   HENT:  Head: Normocephalic and atraumatic.  Right Ear: External ear normal.  Left Ear: External ear normal.  Nose: Nose normal.  Mouth/Throat: Oropharynx is clear and moist. No oropharyngeal exudate.  TM's gray and translucent bilaterally.  Uvula midline. No trismus  Eyes: Conjunctivae and EOM are normal. Pupils are equal, round, and reactive to light. Right eye exhibits no discharge. Left eye exhibits no discharge.  No foreign bodies visualized bilaterally.  No palpable masses to the eyelids throughout.  No pain with eye movement.  No photophobia.  Conjunctiva injected on the left.  Sclera clear on the right. No periorbital edema or erythema bilaterally.  No increased drainage or tearing.  Neck: Normal range of motion. Neck supple.  Cardiovascular: Normal rate, regular rhythm and normal heart sounds.  Exam reveals no gallop and no friction rub.   No murmur heard. Pulmonary/Chest: Effort normal. No respiratory distress. He has no wheezes. He has no rales. He exhibits no tenderness.  Abdominal: Soft. He exhibits no distension. There is no tenderness.  Musculoskeletal: Normal range of motion. He exhibits no edema and no tenderness.  Neurological: He is alert and oriented to person, place, and time.  Skin: Skin is warm and dry. He is not diaphoretic.  Psychiatric: He has a normal mood and affect. His behavior is normal.    ED Course  Procedures (including critical  care time)  DIAGNOSTIC STUDIES: Oxygen Saturation is 97% on room air, normal by my interpretation.    COORDINATION OF CARE: 7:16 PM- Patient is not in room.   7:30PM- Ordered fluorescein dye and tetracaine. Discussed treatment plan with patient at bedside and patient verbalized agreement.   Labs Review Labs Reviewed - No data to display Imaging Review No results found.  EKG Interpretation   None       MDM   1. Conjunctivitis of left eye     [name] is a 42 y.o. Male with a PMH of migraines, chronic back pain, and schizophrenia who presents to the Emergency Department complaining of left eye pain onset 7-10 days ago.  Tetracaine and  fluorescein stain ordered.    Rechecks  7:30 PM = Visual Acuity - Bilateral Near: 20/20 ; R Near: 20/25 ; L Near: 20/20 7:44PM- Woods lamp examination revealed increased uptake diffusely to the left inferior sclera consistent. No obvious corneal abrasion bilaterally. Discussed that there is no evidence of retained foreign bodies or corneal abrasions. Patient states that he does not like the effects of ointments, so will discharge patient with antibiotic drops to manage symptoms. Advised patient to follow up with his optometrist or to return to the ED if there are any new or worsening symptoms. Discussed treatment plan with patient at bedside and patient verbalized agreement.     Etiology of eye pain is likely due to a conjunctivitis with an unclear bacterial vs irritation vs viral etiology.  There was no evidence of a foreign body on exam.  Patient had diffuse uptake to the inferior left sclera with no obvious corneal abrasion.  Patient had no pain with eye movement, severe pain, fever, periorbital edea, or other concerning signs of a periorbital cellulitis or acute angle closure glaucoma.  Patient was prescribed Polytrim for outpatient management.  Patient encouraged to follow-up with his ophthalmologist as soon as possible for further evaluation and  management. Patient was instructed to return to the ED if they experience any fever, severe pain, drainage, eye swelling, or other concerns.  Patient was in agreement with discharge and plan.     Final impressions: 1. Conjunctivitis, left     Luiz Iron PA-C   I personally performed the services described in this documentation, which was scribed in my presence. The recorded information has been reviewed and is accurate.     Jillyn Ledger, PA-C 06/25/13 1929

## 2013-06-22 NOTE — Discharge Instructions (Signed)
Return to the ED if you have severe eye pain, eye swelling/redness, increased eye drainage, fever, repeated vomiting, severe headache, loss of vision, pain with eye movement, or any other concerns   Conjunctivitis Conjunctivitis is commonly called "pink eye." Conjunctivitis can be caused by bacterial or viral infection, allergies, or injuries. There is usually redness of the lining of the eye, itching, discomfort, and sometimes discharge. There may be deposits of matter along the eyelids. A viral infection usually causes a watery discharge, while a bacterial infection causes a yellowish, thick discharge. Pink eye is very contagious and spreads by direct contact. You may be given antibiotic eyedrops as part of your treatment. Before using your eye medicine, remove all drainage from the eye by washing gently with warm water and cotton balls. Continue to use the medication until you have awakened 2 mornings in a row without discharge from the eye. Do not rub your eye. This increases the irritation and helps spread infection. Use separate towels from other household members. Wash your hands with soap and water before and after touching your eyes. Use cold compresses to reduce pain and sunglasses to relieve irritation from light. Do not wear contact lenses or wear eye makeup until the infection is gone. SEEK MEDICAL CARE IF:   Your symptoms are not better after 3 days of treatment.  You have increased pain or trouble seeing.  The outer eyelids become very red or swollen. Document Released: 10/02/2004 Document Revised: 11/17/2011 Document Reviewed: 08/25/2005 [facility] Patient Information 2014 [place], [place].

## 2013-06-22 NOTE — ED Notes (Signed)
Pt states about one week prior he was grinding on a stone and got something in his eye and is not having redenss and "crustiness" in his left eye.  Pt reports history of chronic dry eye

## 2013-06-22 NOTE — ED Notes (Signed)
Pt requesting Rx for eye gtts instead of eye ointment, st's he does not do well with ointment.

## 2013-06-27 NOTE — ED Provider Notes (Signed)
Medical screening examination/treatment/procedure(s) were performed by non-physician practitioner and as supervising physician I was immediately available for consultation/collaboration.  Lorane Cousar L Neoma Uhrich, MD 06/27/13 1714 

## 2013-10-11 ENCOUNTER — Emergency Department (HOSPITAL_COMMUNITY): Payer: Medicare Other

## 2013-10-11 ENCOUNTER — Encounter (HOSPITAL_COMMUNITY): Payer: Self-pay | Admitting: Emergency Medicine

## 2013-10-11 ENCOUNTER — Observation Stay (HOSPITAL_COMMUNITY)
Admission: EM | Admit: 2013-10-11 | Discharge: 2013-10-11 | Disposition: A | Discharge status: Home / Self Care | Payer: Medicare Other | Attending: Internal Medicine | Admitting: Internal Medicine

## 2013-10-11 DIAGNOSIS — M549 Dorsalgia, unspecified: Secondary | ICD-10-CM | POA: Insufficient documentation

## 2013-10-11 DIAGNOSIS — F259 Schizoaffective disorder, unspecified: Secondary | ICD-10-CM | POA: Diagnosis present

## 2013-10-11 DIAGNOSIS — F250 Schizoaffective disorder, bipolar type: Secondary | ICD-10-CM

## 2013-10-11 DIAGNOSIS — I517 Cardiomegaly: Secondary | ICD-10-CM

## 2013-10-11 DIAGNOSIS — G8929 Other chronic pain: Secondary | ICD-10-CM | POA: Diagnosis present

## 2013-10-11 DIAGNOSIS — R197 Diarrhea, unspecified: Secondary | ICD-10-CM

## 2013-10-11 DIAGNOSIS — G43909 Migraine, unspecified, not intractable, without status migrainosus: Secondary | ICD-10-CM | POA: Insufficient documentation

## 2013-10-11 DIAGNOSIS — F17200 Nicotine dependence, unspecified, uncomplicated: Secondary | ICD-10-CM | POA: Insufficient documentation

## 2013-10-11 DIAGNOSIS — R9431 Abnormal electrocardiogram [ECG] [EKG]: Secondary | ICD-10-CM | POA: Diagnosis present

## 2013-10-11 DIAGNOSIS — R079 Chest pain, unspecified: Principal | ICD-10-CM | POA: Diagnosis present

## 2013-10-11 LAB — CBC
HEMATOCRIT: 48 % (ref 39.0–52.0)
HEMOGLOBIN: 17.2 g/dL — AB (ref 13.0–17.0)
MCH: 31.2 pg (ref 26.0–34.0)
MCHC: 35.8 g/dL (ref 30.0–36.0)
MCV: 87.1 fL (ref 78.0–100.0)
Platelets: 220 10*3/uL (ref 150–400)
RBC: 5.51 MIL/uL (ref 4.22–5.81)
RDW: 12.6 % (ref 11.5–15.5)
WBC: 10 10*3/uL (ref 4.0–10.5)

## 2013-10-11 LAB — HEPATIC FUNCTION PANEL
ALT: 33 U/L (ref 0–53)
AST: 21 U/L (ref 0–37)
Albumin: 3.8 g/dL (ref 3.5–5.2)
Alkaline Phosphatase: 52 U/L (ref 39–117)
Bilirubin, Direct: <0.2 mg/dL (ref 0.0–0.3)
TOTAL PROTEIN: 7 g/dL (ref 6.0–8.3)
Total Bilirubin: 0.4 mg/dL (ref 0.3–1.2)

## 2013-10-11 LAB — BASIC METABOLIC PANEL
BUN: 5 mg/dL — AB (ref 6–23)
CO2: 24 mEq/L (ref 19–32)
Calcium: 9.6 mg/dL (ref 8.4–10.5)
Chloride: 102 mEq/L (ref 96–112)
Creatinine, Ser: 0.85 mg/dL (ref 0.50–1.35)
GFR calc Af Amer: >90 mL/min (ref >90)
GFR calc non Af Amer: >90 mL/min (ref >90)
GLUCOSE: 88 mg/dL (ref 70–99)
POTASSIUM: 3.5 meq/L — AB (ref 3.7–5.3)
Sodium: 141 mEq/L (ref 137–147)

## 2013-10-11 LAB — POCT I-STAT, CHEM 8
BUN: <3 mg/dL — ABNORMAL LOW (ref 6–23)
CHLORIDE: 105 meq/L (ref 96–112)
Calcium, Ion: 1.2 mmol/L (ref 1.12–1.23)
Creatinine, Ser: 0.9 mg/dL (ref 0.50–1.35)
Glucose, Bld: 91 mg/dL (ref 70–99)
HEMATOCRIT: 49 % (ref 39.0–52.0)
Hemoglobin: 16.7 g/dL (ref 13.0–17.0)
POTASSIUM: 3.6 meq/L — AB (ref 3.7–5.3)
SODIUM: 141 meq/L (ref 137–147)
TCO2: 25 mmol/L (ref 0–100)

## 2013-10-11 LAB — TROPONIN I

## 2013-10-11 LAB — ACETAMINOPHEN LEVEL: Acetaminophen (Tylenol), Serum: <15 ug/mL (ref 10–30)

## 2013-10-11 LAB — POCT I-STAT TROPONIN I
TROPONIN I, POC: 0 ng/mL (ref 0.00–0.08)
Troponin i, poc: 0 ng/mL (ref 0.00–0.08)

## 2013-10-11 LAB — PRO B NATRIURETIC PEPTIDE: Pro B Natriuretic peptide (BNP): 21.9 pg/mL (ref 0–125)

## 2013-10-11 LAB — MRSA PCR SCREENING: MRSA BY PCR: NEGATIVE

## 2013-10-11 MED ORDER — CARVEDILOL 6.25 MG PO TABS
6.2500 mg | ORAL_TABLET | Freq: Two times a day (BID) | ORAL | Status: DC
  Filled 2013-10-11 (×3): qty 1

## 2013-10-11 MED ORDER — ASPIRIN 81 MG PO CHEW
324.0000 mg | CHEWABLE_TABLET | Freq: Once | ORAL | Status: AC
Start: 2013-10-11 — End: 2013-10-11
  Administered 2013-10-11: 324 mg via ORAL
  Filled 2013-10-11: qty 4

## 2013-10-11 MED ORDER — NICOTINE 21 MG/24HR TD PT24
21.0000 mg | MEDICATED_PATCH | Freq: Every day | TRANSDERMAL | Status: DC
  Administered 2013-10-11: 21 mg via TRANSDERMAL
  Filled 2013-10-11 (×2): qty 1

## 2013-10-11 MED ORDER — GI COCKTAIL ~~LOC~~
30.0000 mL | Freq: Four times a day (QID) | ORAL | Status: DC | PRN
  Administered 2013-10-11: 30 mL via ORAL
  Filled 2013-10-11: qty 30

## 2013-10-11 MED ORDER — ACETAMINOPHEN 325 MG PO TABS: 650.0000 mg | ORAL_TABLET | ORAL | Status: DC | PRN

## 2013-10-11 MED ORDER — ONDANSETRON HCL 4 MG/2ML IJ SOLN: 4.0000 mg | Freq: Four times a day (QID) | INTRAMUSCULAR | Status: DC | PRN

## 2013-10-11 MED ORDER — ALPRAZOLAM 0.25 MG PO TABS
1.0000 mg | ORAL_TABLET | Freq: Once | ORAL | Status: AC
  Administered 2013-10-11: 1 mg via ORAL
  Filled 2013-10-11: qty 4

## 2013-10-11 MED ORDER — MORPHINE SULFATE 4 MG/ML IJ SOLN
4.0000 mg | Freq: Once | INTRAMUSCULAR | Status: AC
  Administered 2013-10-11: 4 mg via INTRAVENOUS
  Filled 2013-10-11: qty 1

## 2013-10-11 MED ORDER — ASPIRIN EC 325 MG PO TBEC
325.0000 mg | DELAYED_RELEASE_TABLET | Freq: Every day | ORAL | Status: DC
  Administered 2013-10-11: 325 mg via ORAL
  Filled 2013-10-11: qty 1

## 2013-10-11 MED ORDER — PANTOPRAZOLE SODIUM 40 MG PO TBEC
40.0000 mg | DELAYED_RELEASE_TABLET | Freq: Every day | ORAL | Status: DC
  Filled 2013-10-11: qty 1

## 2013-10-11 NOTE — H&P (Signed)
PCP:   Woody Seller, MD   Chief Complaint:  cp  HPI: 43 yo male with sscp started about 930am today, was resolved with drinking a lot of mountain dew.  Later on in the late afternoon he was sitting watching tv when he started with sscp again no radiation.  No fevers.  No cough.  No le edema or swelling.  No n/v.  Last several hours, came to ED and pain was relieved with morphine and asa.  No recent illnessess, however he is trying to not take any of his chronic pain meds which he thinks he is withdrawing from.  No prev h/o cad or stress testing.  Review of Systems:  Positive and negative as per HPI otherwise all other systems are negative  Past Medical History: Past Medical History  Diagnosis Date  . Migraines   . Schizophrenia, schizo-affective   . Chronic back pain    Past Surgical History  Procedure Laterality Date  . Knee surgery    . Cholecystectomy      Medications: Prior to Admission medications   Medication Sig Start Date End Date Taking? Authorizing Provider  ALPRAZolam [name]) 1 MG tablet Take 1 mg by mouth 5 (five) times daily.   Yes Historical Provider, MD  carvedilol (COREG) 6.25 MG tablet Take 1 tablet by mouth 2 (two) times daily. 08/30/13  Yes Historical Provider, MD  fluPHENAZine (PROLIXIN) 5 MG tablet Take 20 mg by mouth at bedtime.    Yes Historical Provider, MD  HYDROcodone-acetaminophen (NORCO) 10-325 MG per tablet Take 1 tablet by mouth 3 (three) times daily as needed. For pain   Yes Historical Provider, MD  meloxicam (MOBIC) 7.5 MG tablet Take 1 tablet by mouth daily. 09/22/13  Yes Historical Provider, MD  omeprazole (PRILOSEC) 40 MG capsule Take 40 mg by mouth daily.   Yes Historical Provider, MD  oxyCODONE-acetaminophen (PERCOCET/ROXICET) 5-325 MG per tablet Take 1 tablet by mouth once.   Yes Historical Provider, MD    Allergies:   Allergies  Allergen Reactions  . Nsaids     Rectal bleeding  . Quetiapine     psychosis    Social History:  reports that he has been smoking Cigarettes.  He has been smoking about 1.00 pack per day. He does not have any smokeless tobacco history on file. He reports that he uses illicit drugs. He reports that he does not drink alcohol.  Family History: Father died in 22s of heart attack  Physical Exam: Filed Vitals:   10/11/13 0058 10/11/13 0115 10/11/13 0200 10/11/13 0230  BP: 125/90 126/87 134/94 133/88  Pulse: 85 69 65 67  Temp: 99.1 F (37.3 C)     TempSrc: Oral     Resp: 20 14 15 18   Weight: 97.75 kg (215 lb 8 oz)     SpO2: 97% 97% 97% 99%   General appearance: alert, cooperative and no distress Head: Normocephalic, without obvious abnormality, atraumatic Eyes: negative Nose: Nares normal. Septum midline. Mucosa normal. No drainage or sinus tenderness. Neck: no JVD and supple, symmetrical, trachea midline Lungs: clear to auscultation bilaterally Heart: regular rate and rhythm, S1, S2 normal, no murmur, click, rub or gallop Abdomen: soft, non-tender; bowel sounds normal; no masses,  no organomegaly Extremities: extremities normal, atraumatic, no cyanosis or edema Pulses: 2+ and symmetric Skin: Skin color, texture, turgor normal. No rashes or lesions Neurologic: Grossly normal    Labs on Admission:   Recent Labs  10/11/13 0101 10/11/13 0224  NA 141 141  K 3.5* 3.6*  CL 102 105  CO2 24  --   GLUCOSE 88 91  BUN 5* <3*  CREATININE 0.85 0.90  CALCIUM 9.6  --     Recent Labs  10/11/13 0101 10/11/13 0224  WBC 10.0  --   HGB 17.2* 16.7  HCT 48.0 49.0  MCV 87.1  --   PLT 220  --    Radiological Exams on Admission: Dg Chest 2 View  10/11/2013   CLINICAL DATA:  Chest pain.  Drug problem.  EXAM: CHEST  2 VIEW  COMPARISON:  10/30/2011  FINDINGS: Normal heart size and mediastinal contours. No acute infiltrate or edema. No effusion or pneumothorax. No acute osseous findings.  IMPRESSION: No active cardiopulmonary disease.   Electronically Signed   By: Jorje Guild M.D.    On: 10/11/2013 01:33    Assessment/Plan  43 yo male with atypical cp with tw changes in inferior leads, nonacute  Principal Problem:   Chest pain-  Asa.  Romi.  Echo in am.  outpt stress testing if all negative.  Active Problems:   Schizophrenia, schizo-affective   Chronic back pain   EKG abnormalities    Vernice Mannina A 10/11/2013, 2:58 AM

## 2013-10-11 NOTE — Progress Notes (Signed)
  Echocardiogram 2D Echocardiogram has been performed.  [name] 10/11/2013, 12:41 PM

## 2013-10-11 NOTE — ED Provider Notes (Signed)
CSN: [id number]     Arrival date & time 10/11/13  0051 History   First MD Initiated Contact with Patient 10/11/13 [phone]     Chief Complaint  Patient presents with  . Chest Pain  . Drug Problem   (Consider location/radiation/quality/duration/timing/severity/associated sxs/prior Treatment) HPI Complains of left-sided anterior chest pain, worse when he smokes onset upon awakening 9 AM yesterday. Pain is constant, waxing and waning as. Very mild at present.. Admits to mild shortness of breath. Admits to nausea no vomiting. He also complains of diffuse aches, feeling like "he may be withdrawing from [place] that he takes for chronic back pain. He last took Norco one week ago. States she's taken it more than prescribed. No other associated symptoms. Past Medical History  Diagnosis Date  . Migraines   . Schizophrenia, schizo-affective   . Chronic back pain    family history father had MI approximately  age42 Past Surgical History  Procedure Laterality Date  . Knee surgery    . Cholecystectomy     No family history on file. History  Substance Use Topics  . Smoking status: Current Every Day Smoker -- 1.00 packs/day    Types: Cigarettes  . Smokeless tobacco: Not on file  . Alcohol Use: No    Review of Systems  Constitutional: Negative.   HENT: Negative.   Respiratory: Positive for shortness of breath.   Cardiovascular: Positive for chest pain.  Gastrointestinal: Positive for nausea.  Musculoskeletal: Positive for myalgias.  Skin: Negative.   Neurological: Negative.   Psychiatric/Behavioral: Negative.   All other systems reviewed and are negative.    Allergies  Nsaids and Quetiapine  Home Medications   Current Outpatient Rx  Name  Route  Sig  Dispense  Refill  . fluPHENAZine (PROLIXIN) 5 MG tablet   Oral   Take 20 mg by mouth at bedtime.          Marland Kitchen albuterol (PROVENTIL HFA;VENTOLIN HFA) 108 (90 BASE) MCG/ACT inhaler   Inhalation   Inhale 2 puffs into the lungs every 6  (six) hours as needed for wheezing.         Marland Kitchen ALPRAZolam (XANAX) 1 MG tablet   Oral   Take 1 mg by mouth 5 (five) times daily.         Marland Kitchen aspirin 325 MG tablet   Oral   Take 325 mg by mouth daily.         . cycloSPORINE (RESTASIS) 0.05 % ophthalmic emulsion   Both Eyes   Place 1 drop into both eyes 2 (two) times daily.         Marland Kitchen HYDROcodone-acetaminophen (NORCO) 10-325 MG per tablet   Oral   Take 1 tablet by mouth 3 (three) times daily as needed. For pain         . omeprazole (PRILOSEC) 40 MG capsule   Oral   Take 40 mg by mouth daily.         . Probiotic Product (PROBIOTIC DAILY PO)   Oral   Take 1 tablet by mouth daily.         Marland Kitchen trimethoprim-polymyxin b (POLYTRIM) ophthalmic solution      1 to 2 drops four times daily for 5 to 7 days   10 mL   0    BP 125/90  Pulse 85  Temp(Src) 99.1 F (37.3 C) (Oral)  Resp 20  Wt 215 lb 8 oz (97.75 kg)  SpO2 97% Physical Exam  Nursing note and vitals reviewed. Constitutional: He  appears well-developed and well-nourished.  HENT:  Head: Normocephalic and atraumatic.  Eyes: Conjunctivae are normal. Pupils are equal, round, and reactive to light.  Neck: Neck supple. No tracheal deviation present. No thyromegaly present.  Cardiovascular: Normal rate and regular rhythm.   No murmur heard. Pulmonary/Chest: Effort normal and breath sounds normal.  Abdominal: Soft. Bowel sounds are normal. He exhibits no distension. There is no tenderness.  Musculoskeletal: Normal range of motion. He exhibits no edema and no tenderness.  Neurological: He is alert. Coordination normal.  Skin: Skin is warm and dry. No rash noted.  Psychiatric: He has a normal mood and affect.    ED Course  Procedures (including critical care time) Labs Review Labs Reviewed  CBC - Abnormal; Notable for the following:    Hemoglobin 17.2 (*)    All other components within normal limits  BASIC METABOLIC PANEL  PRO B NATRIURETIC PEPTIDE  POCT I-STAT  TROPONIN I   Imaging Review Dg Chest 2 View  10/11/2013   CLINICAL DATA:  Chest pain.  Drug problem.  EXAM: CHEST  2 VIEW  COMPARISON:  10/30/2011  FINDINGS: Normal heart size and mediastinal contours. No acute infiltrate or edema. No effusion or pneumothorax. No acute osseous findings.  IMPRESSION: No active cardiopulmonary disease.   Electronically Signed   By: Jorje Guild M.D.   On: 10/11/2013 01:33     Date: 10/11/2013  Rate: 80  Rhythm: sinus bradycardia  QRS Axis: normal  Intervals: normal  ST/T Wave abnormalities: nonspecific T wave changes  Conduction Disutrbances:none  Narrative Interpretation:   Old EKG Reviewed: Possible inferolateral ischemic changes new since 01/07/2011 interpreted by me   Results for orders placed during the hospital encounter of 10/11/13  CBC      Result Value Range   WBC 10.0  4.0 - 10.5 K/uL   RBC 5.51  4.22 - 5.81 MIL/uL   Hemoglobin 17.2 (*) 13.0 - 17.0 g/dL   HCT 48.0  39.0 - 52.0 %   MCV 87.1  78.0 - 100.0 fL   MCH 31.2  26.0 - 34.0 pg   MCHC 35.8  30.0 - 36.0 g/dL   RDW 12.6  11.5 - 15.5 %   Platelets 220  150 - 400 K/uL  BASIC METABOLIC PANEL      Result Value Range   Sodium 141  137 - 147 mEq/L   Potassium 3.5 (*) 3.7 - 5.3 mEq/L   Chloride 102  96 - 112 mEq/L   CO2 24  19 - 32 mEq/L   Glucose, Bld 88  70 - 99 mg/dL   BUN 5 (*) 6 - 23 mg/dL   Creatinine, Ser 0.85  0.50 - 1.35 mg/dL   Calcium 9.6  8.4 - 10.5 mg/dL   GFR calc non Af Amer >90  >90 mL/min   GFR calc Af Amer >90  >90 mL/min  PRO B NATRIURETIC PEPTIDE      Result Value Range   Pro B Natriuretic peptide (BNP) 21.9  0 - 125 pg/mL  HEPATIC FUNCTION PANEL      Result Value Range   Total Protein 7.0  6.0 - 8.3 g/dL   Albumin 3.8  3.5 - 5.2 g/dL   AST 21  0 - 37 U/L   ALT 33  0 - 53 U/L   Alkaline Phosphatase 52  39 - 117 U/L   Total Bilirubin 0.4  0.3 - 1.2 mg/dL   Bilirubin, Direct <0.2  0.0 - 0.3 mg/dL  Indirect Bilirubin NOT CALCULATED  0.3 - 0.9 mg/dL   ACETAMINOPHEN LEVEL      Result Value Range   Acetaminophen (Tylenol), Serum <15.0  10 - 30 ug/mL  POCT I-STAT TROPONIN I      Result Value Range   Troponin i, poc 0.00  0.00 - 0.08 ng/mL   Comment 3           POCT I-STAT, CHEM 8      Result Value Range   Sodium 141  137 - 147 mEq/L   Potassium 3.6 (*) 3.7 - 5.3 mEq/L   Chloride 105  96 - 112 mEq/L   BUN <3 (*) 6 - 23 mg/dL   Creatinine, Ser 0.90  0.50 - 1.35 mg/dL   Glucose, Bld 91  70 - 99 mg/dL   Calcium, Ion 1.20  1.12 - 1.23 mmol/L   TCO2 25  0 - 100 mmol/L   Hemoglobin 16.7  13.0 - 17.0 g/dL   HCT 49.0  39.0 - 52.0 %  POCT I-STAT TROPONIN I      Result Value Range   Troponin i, poc 0.00  0.00 - 0.08 ng/mL   Comment 3            Dg Chest 2 View  10/11/2013   CLINICAL DATA:  Chest pain.  Drug problem.  EXAM: CHEST  2 VIEW  COMPARISON:  10/30/2011  FINDINGS: Normal heart size and mediastinal contours. No acute infiltrate or edema. No effusion or pneumothorax. No acute osseous findings.  IMPRESSION: No active cardiopulmonary disease.   Electronically Signed   By: Jorje Guild M.D.   On: 10/11/2013 01:33    2:45 AM patient pain-free after treatment with intravenous morphine and aspirin Chest x-ray viewed by me  MDM  No diagnosis found. Cardiac risk factors smoker family history Spoke with Dr. Shanon Brow plan 23 hour observation telemetry Diagnosis #1 chest pain #2 tobacco abuse #3 opiate withdrawal   Orlie Dakin, MD 10/11/13 [phone]

## 2013-10-11 NOTE — Discharge Summary (Addendum)
Physician Discharge Summary  [name] ZOX:[id number] DOB: 1970/11/28 DOA: 10/11/2013  PCP: Woody Seller, MD  Admit date: 10/11/2013 Discharge date: 10/11/2013  Time spent: 45 minutes  Recommendations for Outpatient Follow-up:  -Will be discharged home today. -Advised to follow up with PCP in 2 weeks.   Discharge Diagnoses:  Principal Problem:   Chest pain Active Problems:   Schizophrenia, schizo-affective   Chronic back pain   EKG abnormalities   Discharge Condition: Stable and improved  Filed Weights   10/11/13 0058 10/11/13 0459  Weight: 97.75 kg (215 lb 8 oz) 95.1 kg (209 lb 10.5 oz)    History of present illness:  43 yo male with sscp started about 930am today, was resolved with drinking a lot of mountain dew. Later on in the late afternoon he was sitting watching tv when he started with sscp again no radiation. No fevers. No cough. No le edema or swelling. No n/v. Last several hours, came to ED and pain was relieved with morphine and asa. No recent illnessess, however he is trying to not take any of his chronic pain meds which he thinks he is withdrawing from. No prev h/o cad or stress testing. Hospitalist admission was requested.   Hospital Course:   Chest Pain -Resolved. -Has ruled out for ACS. -Suspect related to GI issues, especially given the fact that he drank 2-3L of mountain dew and has been belching a lot. -No further cardiac work up deemed necessary at this time.  Rest of chronic medical issues have been stable this hospitalization.  Procedures:  None   Consultations:  None  Discharge Instructions  Discharge Orders   Future Orders Complete By Expires   Discontinue IV  As directed    Increase activity slowly  As directed        Medication List         ALPRAZolam 1 MG tablet  Commonly known as:  XANAX  Take 1 mg by mouth 5 (five) times daily.     carvedilol 6.25 MG tablet  Commonly known as:  COREG  Take 1 tablet by mouth 2  (two) times daily.     fluPHENAZine 5 MG tablet  Commonly known as:  PROLIXIN  Take 20 mg by mouth at bedtime.     HYDROcodone-acetaminophen 10-325 MG per tablet  Commonly known as:  NORCO  Take 1 tablet by mouth 3 (three) times daily as needed. For pain     meloxicam 7.5 MG tablet  Commonly known as:  MOBIC  Take 1 tablet by mouth daily.     omeprazole 40 MG capsule  Commonly known as:  PRILOSEC  Take 40 mg by mouth daily.     oxyCODONE-acetaminophen 5-325 MG per tablet  Commonly known as:  PERCOCET/ROXICET  Take 1 tablet by mouth once.       Allergies  Allergen Reactions  . Nsaids     Rectal bleeding  . Quetiapine     psychosis       Follow-up Information   Follow up with Woody Seller, MD. Schedule an appointment as soon as possible for a visit in 2 weeks.   Specialty:  Family Medicine   Contact information:   [address] [place] [address] [phone]        The results of significant diagnostics from this hospitalization (including imaging, microbiology, ancillary and laboratory) are listed below for reference.    Significant Diagnostic Studies: Dg Chest 2 View  10/11/2013   CLINICAL DATA:  Chest pain.  Drug problem.  EXAM: CHEST  2 VIEW  COMPARISON:  10/30/2011  FINDINGS: Normal heart size and mediastinal contours. No acute infiltrate or edema. No effusion or pneumothorax. No acute osseous findings.  IMPRESSION: No active cardiopulmonary disease.   Electronically Signed   By: Jorje Guild M.D.   On: 10/11/2013 01:33    Microbiology: Recent Results (from the past 240 hour(s))  MRSA PCR SCREENING     Status: None   Collection Time    10/11/13  5:14 AM      Result Value Range Status   MRSA by PCR NEGATIVE  NEGATIVE Final   Comment:            The GeneXpert MRSA Assay (FDA     approved for NASAL specimens     only), is one component of a     comprehensive MRSA colonization     surveillance program. It is not     intended to diagnose  MRSA     infection nor to guide or     monitor treatment for     MRSA infections.     Labs: Basic Metabolic Panel:  Recent Labs Lab 10/11/13 0101 10/11/13 0224  NA 141 141  K 3.5* 3.6*  CL 102 105  CO2 24  --   GLUCOSE 88 91  BUN 5* <3*  CREATININE 0.85 0.90  CALCIUM 9.6  --    Liver Function Tests:  Recent Labs Lab 10/11/13 0216  AST 21  ALT 33  ALKPHOS 52  BILITOT 0.4  PROT 7.0  ALBUMIN 3.8   No results found for this basename: LIPASE, AMYLASE,  in the last 168 hours No results found for this basename: AMMONIA,  in the last 168 hours CBC:  Recent Labs Lab 10/11/13 0101 10/11/13 0224  WBC 10.0  --   HGB 17.2* 16.7  HCT 48.0 49.0  MCV 87.1  --   PLT 220  --    Cardiac Enzymes:  Recent Labs Lab 10/11/13 1138  TROPONINI <0.30   BNP: BNP (last 3 results)  Recent Labs  10/11/13 0101  PROBNP 21.9   CBG: No results found for this basename: GLUCAP,  in the last 168 hours     Signed:  Lelon Frohlich  [facility] Pager: [phone] 10/11/2013, 4:26 PM

## 2013-10-11 NOTE — ED Notes (Signed)
Pt. reports left chest pain with SOB , nausea and vomitting onset this evening , pt. also stated he is having " withdrawals" from [place] , agitation/ twitchings at triage .

## 2013-10-11 NOTE — Progress Notes (Addendum)
Pt d/c instructions reviewed with pt, copy of instructions given to pt. Pt d/c via wheelchair with belongings, escorted by unit NT. Pt's mother picking pt up at main entrance/A.

## 2013-10-11 NOTE — ED Notes (Signed)
Pt. States "I was chain smoking tonight and I began to have pain in my left chest. It's not in my heart but in my lungs". Pt. Describes pain as dull and achy. Denies radiation, numbness/tingling in extremities, N/V. Does reports some SOB. Pt.also reports out of "Norco" x1 week, reports taking a percocet yesterday.

## 2013-10-11 NOTE — Progress Notes (Signed)
UR completed 

## 2013-10-11 NOTE — ED Notes (Signed)
Pt. Returned from xray 

## 2013-10-29 ENCOUNTER — Encounter (HOSPITAL_COMMUNITY): Payer: Self-pay | Admitting: Emergency Medicine

## 2013-10-29 ENCOUNTER — Emergency Department (HOSPITAL_COMMUNITY)
Admission: EM | Admit: 2013-10-29 | Discharge: 2013-10-29 | Discharge status: Against Medical Advice | Payer: Medicare Other | Attending: Emergency Medicine | Admitting: Emergency Medicine

## 2013-10-29 DIAGNOSIS — Y9389 Activity, other specified: Secondary | ICD-10-CM | POA: Insufficient documentation

## 2013-10-29 DIAGNOSIS — F17200 Nicotine dependence, unspecified, uncomplicated: Secondary | ICD-10-CM | POA: Insufficient documentation

## 2013-10-29 DIAGNOSIS — S3981XA Other specified injuries of abdomen, initial encounter: Secondary | ICD-10-CM | POA: Insufficient documentation

## 2013-10-29 DIAGNOSIS — Y929 Unspecified place or not applicable: Secondary | ICD-10-CM | POA: Insufficient documentation

## 2013-10-29 DIAGNOSIS — R296 Repeated falls: Secondary | ICD-10-CM | POA: Insufficient documentation

## 2013-10-29 NOTE — ED Notes (Addendum)
Per ems: pt called after falling on ice today, c/o R flank and generalized abd pain since. Pt ambulated into house and called ems for help. No bruising or deformity noted. A&ox4, mae

## 2014-02-17 ENCOUNTER — Encounter (HOSPITAL_COMMUNITY): Payer: Self-pay | Admitting: Emergency Medicine

## 2014-02-17 ENCOUNTER — Emergency Department (HOSPITAL_COMMUNITY)
Admission: EM | Admit: 2014-02-17 | Discharge: 2014-02-17 | Disposition: A | Discharge status: Home / Self Care | Payer: Medicare Other | Attending: Emergency Medicine | Admitting: Emergency Medicine

## 2014-02-17 DIAGNOSIS — F250 Schizoaffective disorder, bipolar type: Secondary | ICD-10-CM

## 2014-02-17 DIAGNOSIS — F17200 Nicotine dependence, unspecified, uncomplicated: Secondary | ICD-10-CM | POA: Insufficient documentation

## 2014-02-17 DIAGNOSIS — Z008 Encounter for other general examination: Secondary | ICD-10-CM | POA: Diagnosis present

## 2014-02-17 DIAGNOSIS — Z96659 Presence of unspecified artificial knee joint: Secondary | ICD-10-CM | POA: Diagnosis not present

## 2014-02-17 DIAGNOSIS — Z79899 Other long term (current) drug therapy: Secondary | ICD-10-CM | POA: Diagnosis not present

## 2014-02-17 DIAGNOSIS — G8929 Other chronic pain: Secondary | ICD-10-CM | POA: Diagnosis not present

## 2014-02-17 DIAGNOSIS — G43909 Migraine, unspecified, not intractable, without status migrainosus: Secondary | ICD-10-CM | POA: Diagnosis not present

## 2014-02-17 DIAGNOSIS — F259 Schizoaffective disorder, unspecified: Secondary | ICD-10-CM | POA: Insufficient documentation

## 2014-02-17 HISTORY — DX: Pain in unspecified knee: M25.569

## 2014-02-17 HISTORY — DX: Other chronic pain: G89.29

## 2014-02-17 LAB — CBC
HEMATOCRIT: 47.9 % (ref 39.0–52.0)
HEMOGLOBIN: 16.9 g/dL (ref 13.0–17.0)
MCH: 30.5 pg (ref 26.0–34.0)
MCHC: 35.3 g/dL (ref 30.0–36.0)
MCV: 86.5 fL (ref 78.0–100.0)
Platelets: 229 10*3/uL (ref 150–400)
RBC: 5.54 MIL/uL (ref 4.22–5.81)
RDW: 12.9 % (ref 11.5–15.5)
WBC: 10.1 10*3/uL (ref 4.0–10.5)

## 2014-02-17 LAB — COMPREHENSIVE METABOLIC PANEL
ALK PHOS: 70 U/L (ref 39–117)
ALT: 43 U/L (ref 0–53)
AST: 26 U/L (ref 0–37)
Albumin: 4.4 g/dL (ref 3.5–5.2)
BUN: 5 mg/dL — AB (ref 6–23)
CHLORIDE: 101 meq/L (ref 96–112)
CO2: 28 mEq/L (ref 19–32)
Calcium: 9.9 mg/dL (ref 8.4–10.5)
Creatinine, Ser: 0.77 mg/dL (ref 0.50–1.35)
GFR calc non Af Amer: >90 mL/min (ref >90)
GLUCOSE: 107 mg/dL — AB (ref 70–99)
Potassium: 3.5 mEq/L — ABNORMAL LOW (ref 3.7–5.3)
Sodium: 141 mEq/L (ref 137–147)
Total Bilirubin: 0.5 mg/dL (ref 0.3–1.2)
Total Protein: 8 g/dL (ref 6.0–8.3)

## 2014-02-17 LAB — RAPID URINE DRUG SCREEN, HOSP PERFORMED
Amphetamines: NOT DETECTED
Barbiturates: NOT DETECTED
Benzodiazepines: POSITIVE — AB
Cocaine: NOT DETECTED
Opiates: NOT DETECTED
Tetrahydrocannabinol: NOT DETECTED

## 2014-02-17 LAB — ETHANOL

## 2014-02-17 MED ORDER — ONDANSETRON HCL 4 MG PO TABS
4.0000 mg | ORAL_TABLET | Freq: Three times a day (TID) | ORAL | Status: DC | PRN
Start: 2014-02-17 — End: 2014-02-17

## 2014-02-17 MED ORDER — ALUM & MAG HYDROXIDE-SIMETH 200-200-20 MG/5ML PO SUSP: 30.0000 mL | ORAL | Status: DC | PRN

## 2014-02-17 MED ORDER — LORAZEPAM 1 MG PO TABS
1.0000 mg | ORAL_TABLET | Freq: Four times a day (QID) | ORAL | Status: DC | PRN
  Administered 2014-02-17: 1 mg via ORAL
  Filled 2014-02-17 (×2): qty 1

## 2014-02-17 MED ORDER — ACETAMINOPHEN 325 MG PO TABS: 650.0000 mg | ORAL_TABLET | ORAL | Status: DC | PRN

## 2014-02-17 MED ORDER — HYDROCODONE-ACETAMINOPHEN 10-325 MG PO TABS
1.0000 | ORAL_TABLET | Freq: Four times a day (QID) | ORAL | Status: DC | PRN
  Administered 2014-02-17 (×2): 1 via ORAL
  Filled 2014-02-17 (×2): qty 1

## 2014-02-17 MED ORDER — NICOTINE 21 MG/24HR TD PT24: 21.0000 mg | MEDICATED_PATCH | Freq: Every day | TRANSDERMAL | Status: DC

## 2014-02-17 MED ORDER — ZOLPIDEM TARTRATE 5 MG PO TABS
5.0000 mg | ORAL_TABLET | Freq: Every evening | ORAL | Status: DC | PRN
  Administered 2014-02-17: 5 mg via ORAL
  Filled 2014-02-17: qty 1

## 2014-02-17 MED ORDER — CYCLOSPORINE 0.05 % OP EMUL
2.0000 [drp] | Freq: Two times a day (BID) | OPHTHALMIC | Status: DC
  Filled 2014-02-17 (×3): qty 1

## 2014-02-17 MED ORDER — PANTOPRAZOLE SODIUM 40 MG PO TBEC: 40.0000 mg | DELAYED_RELEASE_TABLET | Freq: Every day | ORAL | Status: DC

## 2014-02-17 NOTE — ED Notes (Signed)
Pt called out to tell Nursing that he was in pain and hadn't taken his nighttime Norco or his nighttime midazolam. Pt also called a second time to inform Nursing that he had smoked a smokeless cigarette as well as normal cigarettes.  Pt informed Nursing of this "in case any weird drugs show up in my system."

## 2014-02-17 NOTE — ED Notes (Signed)
Charge RN spoke with [facility], pt still needs TTS.

## 2014-02-17 NOTE — ED Provider Notes (Signed)
Patient currently denies suicidal or homicidal ideations. Would like to leave to take care of a personal matter. No active command hallucinations. Stable for discharge  [name] Jacobsen, MD 02/17/14 [phone]

## 2014-02-17 NOTE — ED Notes (Signed)
Book bag: extra clothes, 5 dollars, 10 dollar roll of quarters, electric razor, wallet containing cards&id Hospital bag with shoes and clothes that were worn.

## 2014-02-17 NOTE — ED Notes (Signed)
Patient ambulatory from triage and is calm, cooperative at this time Patient informed of plan of care--verbalized understanding

## 2014-02-17 NOTE — Discharge Instructions (Signed)
Schizoaffective Disorder Schizoaffective disorder (ScAD) is a mental illness. It causes symptoms that are a mixture of schizophrenia (a psychotic disorder) and an affective (mood) disorder. The schizophrenic symptoms may include delusions, hallucinations, or odd behavior. The mood symptoms may be similar to major depression or bipolar disorder. ScAD may interfere with personal relationships or normal daily activities. People with ScAD are at increased risk for job loss, social isolation,physical health problems, anxiety and substance use disorders, and suicide. ScAD usually occurs in cycles. Periods of severe symptoms are followed by periods of less severe symptoms or improvement. The illness affects men and women equally but usually appears at an earlier age (teenage or early adult years) in men. People who have family members with schizophrenia, bipolar disorder, or ScAD are at higher risk of developing ScAD. SYMPTOMS  At any one time, people with ScAD may have psychotic symptoms only or both psychotic and mood symptoms. The psychotic symptoms include one or more of the following:  Hearing, seeing, or feeling things that are not there (hallucinations).   Having fixed, false beliefs (delusions). The delusions usually are of being attacked, harassed, cheated, persecuted, or conspired against (paranoid delusions).  Speaking in a way that makes no sense to others (disorganized speech). The psychotic symptoms of ScAD may also include confusing or odd behavior or any of the negative symptoms of schizophrenia. These include loss of motivation for normal daily activities, such as bathing or grooming, withdrawal from other people, and lack of emotions.  The mood symptoms of ScAD occur more often than not. They resemble major depressive disorder or bipolar mania. Symptoms of major depression include depressed mood and four or more of the following:  Loss of interest in usually pleasurable activities  (anhedonia).  Sleeping more or less than normal.  Feeling worthless or excessively guilty.  Lack of energy or motivation.  Trouble concentrating.  Eating more or less than usual.  Thinking a lot about death or suicide. Symptoms of bipolar mania include abnormally elevated or irritable mood and increased energy or activity, plus three or more of the following:   More confidence than normal or feeling that you are able to do anything (grandiosity).  Feeling rested with less sleep than normal.   Being easily distracted.   Talking more than usual or feeling pressured to keep talking.   Feeling that your thoughts are racing.  Engaging in high-risk activities such as buying sprees or foolish business decisions. DIAGNOSIS  ScAD is diagnosed through an assessment by your health care provider. Your health care provider will observe and ask questions about your thoughts, behavior, mood, and ability to function in daily life. Your health care provider may also ask questions about your medical history and use of drugs, including prescription medicines. Your health care provider may also order blood tests and imaging exams. Certain medical conditions and substances can cause symptoms that resemble ScAD. Your health care provider may refer you to a mental health specialist for evaluation.  ScAD is divided into two types. The depressive type is diagnosed if your mood symptoms are limited to major depression. The bipolar type is diagnosed if your mood symptoms are manic or a mixture of manic and depressive symptoms TREATMENT  ScAD is usually a life-long illness. Long-term treatment is necessary. The following treatments are available:  Medicine Different types of medicine are used to treat ScAD. The exact combination depends on the type and severity of your symptoms. Antipsychotic medicine is used to control psychotic symptomssuch as delusions, paranoia,  and hallucinations. Mood stabilizers can  even the highs and lows of bipolar manic mood swings. Antidepressant medicines are used to treat major depressive symptoms.  Counseling or talk therapy Individual, group, or family counseling may be helpful in providing education, support, and guidance. Many people with ScAD also benefit from social skills and job skills (vocational) training. A combination of medicine and counseling is usually best for managing the disorder over time. A procedure in which electricity is applied to the brain through the scalp (electroconvulsive therapy) may be used to treat people with severe manic symptoms that do not respond to medicine and counseling. HOME CARE INSTRUCTIONS   Take all your medicine as prescribed.  Check with your health care provider before starting new prescription or over-the-counter medicines.  Keep all follow up appointments with your health care provider. SEEK MEDICAL CARE IF:   If you are not able to take your medicines as prescribed.  If your symptoms get worse. SEEK IMMEDIATE MEDICAL CARE IF:   You have serious thoughts about hurting yourself or others. Document Released: 01/05/2007 Document Revised: 06/15/2013 Document Reviewed: 04/08/2013 [facility] Patient Information 2014 [place], [place].

## 2014-02-17 NOTE — ED Notes (Signed)
Pt states for the past two days he has been hearing voices and has been taking more of his psych meds  Pt states he talked to his doctors and told them he wants the real medication and not the generic ones   Pt is constantly shuffling through paperwork while talking to me Pt states he was highly agitated earlier but is calm and cooperative in triage

## 2014-02-17 NOTE — ED Provider Notes (Addendum)
CSN: [id number]     Arrival date & time 02/17/14  0126 History   First MD Initiated Contact with Patient 02/17/14 0244     Chief Complaint  Patient presents with  . Medical Clearance     (Consider location/radiation/quality/duration/timing/severity/associated sxs/prior Treatment) HPI 43 year old male presents to emergency department with complaint of auditory hallucinations.  Patient reports history of schizoaffective disorder.  He reports for last 2 days he has been hearing multiple male and male voices coming from his air conditioning unit.  Patient has taken 8 of his Prolixin today without improvement in his symptoms.  He reports that he got "high" after drinking a [employer] from a friend's house.  He thinks it might of been spiked with something.  Patient reports he did not improve even after "taking a couple bars of my Xanax".  Patient also complaining of diffuse arthralgia.  Your ports history of arthritis in his knee, left knee status post replacement, diffuse disc disease.  Patient does not currently see a psychiatrist, he is a primary care doctor who has him on Xanax and Prolixin.  He reports his last psychiatric admission was in 2007.  He reports that the voices are becoming more intrusive.  He denies SI or HI. Past Medical History  Diagnosis Date  . Migraines   . Schizophrenia, schizo-affective   . Chronic back pain   . Knee pain, chronic    Past Surgical History  Procedure Laterality Date  . Knee surgery      four times   No family history on file. History  Substance Use Topics  . Smoking status: Current Every Day Smoker -- 1.00 packs/day    Types: Cigarettes  . Smokeless tobacco: Not on file  . Alcohol Use: Yes     Comment: occ    Review of Systems   See History of Present Illness; otherwise all other systems are reviewed and negative  Allergies  Nsaids and Quetiapine  Home Medications   Prior to Admission medications   Medication Sig Start Date End Date  Taking? Authorizing Provider  acetaminophen (TYLENOL) 500 MG tablet Take 1,000 mg by mouth every 6 (six) hours as needed for mild pain.   Yes Historical Provider, MD  ALPRAZolam [name]) 1 MG tablet Take 1-2 mg by mouth 4 (four) times daily as needed for anxiety. Take 1mg  three times daily as needed for anxiety and 2mg  at bedtime   Yes Historical Provider, MD  cyclobenzaprine (FLEXERIL) 10 MG tablet Take 10 mg by mouth 3 (three) times daily as needed for muscle spasms.   Yes Historical Provider, MD  fluPHENAZine (PROLIXIN) 5 MG tablet Take 20 mg by mouth at bedtime.    Yes Historical Provider, MD  HYDROcodone-acetaminophen (NORCO) 10-325 MG per tablet Take 1 tablet by mouth 4 (four) times daily as needed for moderate pain. For pain   Yes Historical Provider, MD  omeprazole (PRILOSEC) 40 MG capsule Take 40 mg by mouth 2 (two) times daily.    Yes Historical Provider, MD   BP 125/89  Pulse 87  Temp(Src) 99.5 F (37.5 C) (Oral)  Resp 20  SpO2 97% Physical Exam  Nursing note and vitals reviewed. Constitutional: He is oriented to person, place, and time. He appears well-developed and well-nourished. No distress.  HENT:  Head: Normocephalic and atraumatic.  Nose: Nose normal.  Mouth/Throat: Oropharynx is clear and moist.  Eyes: Conjunctivae and EOM are normal. Pupils are equal, round, and reactive to light.  Neck: Normal range of motion. Neck  supple. No JVD present. No tracheal deviation present. No thyromegaly present.  Cardiovascular: Normal rate, regular rhythm, normal heart sounds and intact distal pulses.  Exam reveals no gallop and no friction rub.   No murmur heard. Pulmonary/Chest: Effort normal and breath sounds normal. No stridor. No respiratory distress. He has no wheezes. He has no rales. He exhibits no tenderness.  Abdominal: Soft. Bowel sounds are normal. He exhibits no distension and no mass. There is no tenderness. There is no rebound and no guarding.  Musculoskeletal: Normal range  of motion. He exhibits no edema and no tenderness.  Lymphadenopathy:    He has no cervical adenopathy.  Neurological: He is alert and oriented to person, place, and time. He has normal reflexes. No cranial nerve deficit. He exhibits normal muscle tone. Coordination normal.  Skin: Skin is warm and dry. No rash noted. No erythema. No pallor.  Psychiatric:  Bizarre mood, flight of ideas, poor insight and judgment    ED Course  Procedures (including critical care time) Labs Review Labs Reviewed  COMPREHENSIVE METABOLIC PANEL - Abnormal; Notable for the following:    Potassium 3.5 (*)    Glucose, Bld 107 (*)    BUN 5 (*)    All other components within normal limits  URINE RAPID DRUG SCREEN (HOSP PERFORMED) - Abnormal; Notable for the following:    Benzodiazepines POSITIVE (*)    All other components within normal limits  CBC  ETHANOL    Imaging Review No results found.   EKG Interpretation None      MDM   Final diagnoses:  Schizophrenia, schizo-affective    43 year old male history of schizoaffective disorder who has increasing auditory hallucinations and psychosis, holding orders have been written.  He is not SI or HI, and does not meet criteria for IVC paperwork at this time.   Kalman Drape, MD 02/17/14 [place], MD 02/17/14 [phone]

## 2014-02-17 NOTE — ED Notes (Signed)
Pt asking when he will be discharged because he has an appointment at 11:00 this morning with housing authority for section 8 re-certification. Spoke with [employer] psych PA and she sts since pt has no SI/HI he can be discharged and follow up with primary care provider.

## 2014-02-17 NOTE — ED Notes (Signed)
Psychiatry in to see pt prior to discharge

## 2014-02-17 NOTE — ED Notes (Signed)
MD at bedside. 

## 2014-02-26 ENCOUNTER — Emergency Department (HOSPITAL_COMMUNITY)
Admission: EM | Admit: 2014-02-26 | Discharge: 2014-02-26 | Disposition: A | Discharge status: Other Acute Inpatient Hospital | Payer: Medicare Other | Attending: Emergency Medicine | Admitting: Emergency Medicine

## 2014-02-26 ENCOUNTER — Inpatient Hospital Stay (HOSPITAL_COMMUNITY)
Admission: AD | Admit: 2014-02-26 | Discharge: 2014-03-06 | DRG: 885 | Disposition: A | Discharge status: Home / Self Care | Payer: 59 | Source: Intra-hospital | Attending: Emergency Medicine | Admitting: Emergency Medicine

## 2014-02-26 ENCOUNTER — Encounter (HOSPITAL_COMMUNITY): Payer: Self-pay | Admitting: *Deleted

## 2014-02-26 ENCOUNTER — Encounter (HOSPITAL_COMMUNITY): Payer: Self-pay | Admitting: Emergency Medicine

## 2014-02-26 DIAGNOSIS — F259 Schizoaffective disorder, unspecified: Secondary | ICD-10-CM | POA: Diagnosis present

## 2014-02-26 DIAGNOSIS — Z79899 Other long term (current) drug therapy: Secondary | ICD-10-CM | POA: Diagnosis not present

## 2014-02-26 DIAGNOSIS — F411 Generalized anxiety disorder: Secondary | ICD-10-CM | POA: Diagnosis not present

## 2014-02-26 DIAGNOSIS — Y9210 Unspecified residential institution as the place of occurrence of the external cause: Secondary | ICD-10-CM | POA: Diagnosis not present

## 2014-02-26 DIAGNOSIS — F17200 Nicotine dependence, unspecified, uncomplicated: Secondary | ICD-10-CM | POA: Diagnosis present

## 2014-02-26 DIAGNOSIS — G8929 Other chronic pain: Secondary | ICD-10-CM | POA: Insufficient documentation

## 2014-02-26 DIAGNOSIS — Z5987 Material hardship due to limited financial resources, not elsewhere classified: Secondary | ICD-10-CM

## 2014-02-26 DIAGNOSIS — S60221A Contusion of right hand, initial encounter: Secondary | ICD-10-CM

## 2014-02-26 DIAGNOSIS — F1310 Sedative, hypnotic or anxiolytic abuse, uncomplicated: Secondary | ICD-10-CM | POA: Insufficient documentation

## 2014-02-26 DIAGNOSIS — S61409A Unspecified open wound of unspecified hand, initial encounter: Secondary | ICD-10-CM | POA: Diagnosis not present

## 2014-02-26 DIAGNOSIS — Z8679 Personal history of other diseases of the circulatory system: Secondary | ICD-10-CM | POA: Insufficient documentation

## 2014-02-26 DIAGNOSIS — S61411A Laceration without foreign body of right hand, initial encounter: Secondary | ICD-10-CM

## 2014-02-26 DIAGNOSIS — S60229A Contusion of unspecified hand, initial encounter: Secondary | ICD-10-CM | POA: Diagnosis not present

## 2014-02-26 DIAGNOSIS — F250 Schizoaffective disorder, bipolar type: Secondary | ICD-10-CM

## 2014-02-26 DIAGNOSIS — F29 Unspecified psychosis not due to a substance or known physiological condition: Secondary | ICD-10-CM | POA: Diagnosis present

## 2014-02-26 DIAGNOSIS — R443 Hallucinations, unspecified: Secondary | ICD-10-CM | POA: Diagnosis present

## 2014-02-26 DIAGNOSIS — K219 Gastro-esophageal reflux disease without esophagitis: Secondary | ICD-10-CM | POA: Diagnosis present

## 2014-02-26 DIAGNOSIS — Z598 Other problems related to housing and economic circumstances: Secondary | ICD-10-CM

## 2014-02-26 DIAGNOSIS — G4700 Insomnia, unspecified: Secondary | ICD-10-CM | POA: Diagnosis present

## 2014-02-26 DIAGNOSIS — F410 Panic disorder [episodic paroxysmal anxiety] without agoraphobia: Secondary | ICD-10-CM | POA: Diagnosis present

## 2014-02-26 DIAGNOSIS — W2209XA Striking against other stationary object, initial encounter: Secondary | ICD-10-CM | POA: Diagnosis not present

## 2014-02-26 DIAGNOSIS — F332 Major depressive disorder, recurrent severe without psychotic features: Secondary | ICD-10-CM | POA: Diagnosis present

## 2014-02-26 DIAGNOSIS — F1320 Sedative, hypnotic or anxiolytic dependence, uncomplicated: Secondary | ICD-10-CM | POA: Diagnosis present

## 2014-02-26 DIAGNOSIS — M549 Dorsalgia, unspecified: Secondary | ICD-10-CM | POA: Diagnosis present

## 2014-02-26 LAB — COMPREHENSIVE METABOLIC PANEL
ALBUMIN: 3.8 g/dL (ref 3.5–5.2)
ALK PHOS: 74 U/L (ref 39–117)
ALT: 44 U/L (ref 0–53)
AST: 27 U/L (ref 0–37)
BILIRUBIN TOTAL: 0.3 mg/dL (ref 0.3–1.2)
BUN: 3 mg/dL — ABNORMAL LOW (ref 6–23)
CHLORIDE: 103 meq/L (ref 96–112)
CO2: 26 meq/L (ref 19–32)
Calcium: 9.2 mg/dL (ref 8.4–10.5)
Creatinine, Ser: 0.79 mg/dL (ref 0.50–1.35)
GFR calc non Af Amer: >90 mL/min (ref >90)
Glucose, Bld: 91 mg/dL (ref 70–99)
POTASSIUM: 4.1 meq/L (ref 3.7–5.3)
Sodium: 140 mEq/L (ref 137–147)
Total Protein: 7 g/dL (ref 6.0–8.3)

## 2014-02-26 LAB — CBC
HCT: 44.2 % (ref 39.0–52.0)
HEMOGLOBIN: 15.2 g/dL (ref 13.0–17.0)
MCH: 30.2 pg (ref 26.0–34.0)
MCHC: 34.4 g/dL (ref 30.0–36.0)
MCV: 87.9 fL (ref 78.0–100.0)
Platelets: 198 10*3/uL (ref 150–400)
RBC: 5.03 MIL/uL (ref 4.22–5.81)
RDW: 13 % (ref 11.5–15.5)
WBC: 8.8 10*3/uL (ref 4.0–10.5)

## 2014-02-26 LAB — ACETAMINOPHEN LEVEL

## 2014-02-26 LAB — ETHANOL: Alcohol, Ethyl (B): <11 mg/dL (ref 0–11)

## 2014-02-26 LAB — RAPID URINE DRUG SCREEN, HOSP PERFORMED
Amphetamines: NOT DETECTED
BARBITURATES: NOT DETECTED
BENZODIAZEPINES: POSITIVE — AB
Cocaine: NOT DETECTED
Opiates: NOT DETECTED
Tetrahydrocannabinol: NOT DETECTED

## 2014-02-26 LAB — SALICYLATE LEVEL: Salicylate Lvl: <2 mg/dL — ABNORMAL LOW (ref 2.8–20.0)

## 2014-02-26 MED ORDER — ALPRAZOLAM 0.5 MG PO TABS
0.5000 mg | ORAL_TABLET | Freq: Three times a day (TID) | ORAL | Status: DC | PRN
  Administered 2014-02-26 – 2014-02-28 (×5): 0.5 mg via ORAL
  Filled 2014-02-26: qty 2
  Filled 2014-02-26: qty 1
  Filled 2014-02-26: qty 2
  Filled 2014-02-26: qty 1
  Filled 2014-02-26 (×2): qty 2
  Filled 2014-02-26: qty 1

## 2014-02-26 MED ORDER — ALUM & MAG HYDROXIDE-SIMETH 200-200-20 MG/5ML PO SUSP
30.0000 mL | Freq: Four times a day (QID) | ORAL | Status: DC | PRN
  Administered 2014-02-28 – 2014-03-06 (×2): 30 mL via ORAL

## 2014-02-26 MED ORDER — ALPRAZOLAM 0.5 MG PO TABS
0.5000 mg | ORAL_TABLET | Freq: Three times a day (TID) | ORAL | Status: DC | PRN
  Administered 2014-02-26: 0.5 mg via ORAL
  Filled 2014-02-26: qty 1

## 2014-02-26 MED ORDER — NICOTINE 21 MG/24HR TD PT24
21.0000 mg | MEDICATED_PATCH | Freq: Every day | TRANSDERMAL | Status: DC
  Filled 2014-02-26 (×14): qty 1

## 2014-02-26 MED ORDER — ZIPRASIDONE HCL 20 MG PO CAPS: 40.0000 mg | ORAL_CAPSULE | Freq: Two times a day (BID) | ORAL | Status: DC

## 2014-02-26 MED ORDER — NICOTINE 21 MG/24HR TD PT24: 21.0000 mg | MEDICATED_PATCH | Freq: Every day | TRANSDERMAL | Status: DC

## 2014-02-26 MED ORDER — ONDANSETRON HCL 4 MG PO TABS: 4.0000 mg | ORAL_TABLET | Freq: Three times a day (TID) | ORAL | Status: DC | PRN

## 2014-02-26 MED ORDER — PANTOPRAZOLE SODIUM 40 MG PO TBEC: 80.0000 mg | DELAYED_RELEASE_TABLET | Freq: Every day | ORAL | Status: DC

## 2014-02-26 MED ORDER — LORAZEPAM 1 MG PO TABS: 1.0000 mg | ORAL_TABLET | Freq: Three times a day (TID) | ORAL | Status: DC | PRN

## 2014-02-26 MED ORDER — MAGNESIUM HYDROXIDE 400 MG/5ML PO SUSP
30.0000 mL | Freq: Every day | ORAL | Status: DC | PRN
  Administered 2014-03-02 – 2014-03-03 (×2): 30 mL via ORAL

## 2014-02-26 MED ORDER — PANTOPRAZOLE SODIUM 40 MG PO TBEC
80.0000 mg | DELAYED_RELEASE_TABLET | Freq: Every day | ORAL | Status: DC
  Administered 2014-02-26 – 2014-03-06 (×9): 80 mg via ORAL
  Filled 2014-02-26 (×15): qty 2

## 2014-02-26 MED ORDER — ACETAMINOPHEN 325 MG PO TABS
650.0000 mg | ORAL_TABLET | Freq: Four times a day (QID) | ORAL | Status: DC | PRN
  Administered 2014-02-26 – 2014-03-04 (×9): 650 mg via ORAL
  Filled 2014-02-26 (×9): qty 2

## 2014-02-26 MED ORDER — ZIPRASIDONE HCL 40 MG PO CAPS
40.0000 mg | ORAL_CAPSULE | Freq: Two times a day (BID) | ORAL | Status: DC
  Administered 2014-02-26 – 2014-02-28 (×4): 40 mg via ORAL
  Filled 2014-02-26: qty 2
  Filled 2014-02-26 (×2): qty 1
  Filled 2014-02-26: qty 2
  Filled 2014-02-26 (×6): qty 1

## 2014-02-26 MED ORDER — ZOLPIDEM TARTRATE 5 MG PO TABS: 5.0000 mg | ORAL_TABLET | Freq: Every evening | ORAL | Status: DC | PRN

## 2014-02-26 MED ORDER — CYCLOBENZAPRINE HCL 10 MG PO TABS: 10.0000 mg | ORAL_TABLET | Freq: Three times a day (TID) | ORAL | Status: DC | PRN

## 2014-02-26 MED ORDER — HYDROCODONE-ACETAMINOPHEN 10-325 MG PO TABS: 1.0000 | ORAL_TABLET | Freq: Four times a day (QID) | ORAL | Status: DC | PRN

## 2014-02-26 MED ORDER — ALUM & MAG HYDROXIDE-SIMETH 200-200-20 MG/5ML PO SUSP: 30.0000 mL | ORAL | Status: DC | PRN

## 2014-02-26 NOTE — ED Notes (Signed)
Patient states that he just took 3 mg of xanax - he was having trouble sleeping. He has a history of schizoaffective disorder and he is hearing voices. The patient denies them at this time.

## 2014-02-26 NOTE — ED Notes (Signed)
Up to the desk on the phone 

## 2014-02-26 NOTE — ED Notes (Signed)
[facility] contacted for transport will be here in 20 mins

## 2014-02-26 NOTE — ED Provider Notes (Signed)
CSN: [id number]     Arrival date & time 02/26/14  0458 History   First MD Initiated Contact with Patient 02/26/14 [phone]     Chief Complaint  Patient presents with  . Hallucinations     (Consider location/radiation/quality/duration/timing/severity/associated sxs/prior Treatment) HPI Comments: Pateint with long standing psy Hx now with insomnia, hallucinations.  Denis SI/HI states has been taking his medication but does not think the generic form works as well.      The history is provided by the patient.    Past Medical History  Diagnosis Date  . Migraines   . Schizophrenia, schizo-affective   . Chronic back pain   . Knee pain, chronic    Past Surgical History  Procedure Laterality Date  . Knee surgery      four times   History reviewed. No pertinent family history. History  Substance Use Topics  . Smoking status: Current Every Day Smoker -- 1.00 packs/day    Types: Cigarettes  . Smokeless tobacco: Not on file  . Alcohol Use: Yes     Comment: occ    Review of Systems  Respiratory: Negative for shortness of breath.   Cardiovascular: Negative for chest pain.  Neurological: Negative for dizziness and headaches.  Psychiatric/Behavioral: Positive for hallucinations and sleep disturbance. Negative for suicidal ideas. The patient is nervous/anxious.   All other systems reviewed and are negative.     Allergies  Nsaids and Quetiapine  Home Medications   Prior to Admission medications   Medication Sig Start Date End Date Taking? Authorizing Syre Knerr  acetaminophen (TYLENOL) 500 MG tablet Take 1,000 mg by mouth every 6 (six) hours as needed for mild pain.   Yes Historical Careem Yasui, MD  ALPRAZolam [name]) 1 MG tablet Take 1-2 mg by mouth 4 (four) times daily as needed for anxiety. Take 1mg  three times daily as needed for anxiety and 2mg  at bedtime   Yes Historical Loney Domingo, MD  Aspirin-Acetaminophen (GOODYS BODY PAIN PO) Take 1 packet by mouth daily as needed. For pain   Yes  Historical Jerica Creegan, MD  cyclobenzaprine (FLEXERIL) 10 MG tablet Take 10 mg by mouth 3 (three) times daily as needed for muscle spasms.   Yes Historical Cabe Lashley, MD  fluPHENAZine (PROLIXIN) 5 MG tablet Take 20 mg by mouth at bedtime.    Yes Historical Davinci Glotfelty, MD  HYDROcodone-acetaminophen (NORCO) 10-325 MG per tablet Take 1 tablet by mouth 4 (four) times daily as needed for moderate pain. For pain   Yes Historical Shedric Fredericks, MD  omeprazole (PRILOSEC) 40 MG capsule Take 40 mg by mouth 2 (two) times daily.    Yes Historical Keith Felten, MD   BP 136/82  Pulse 81  Temp(Src) 98.1 F (36.7 C) (Oral)  Resp 16  SpO2 100% Physical Exam  Vitals reviewed. Constitutional: He appears well-developed and well-nourished.  HENT:  Head: Normocephalic.  Eyes: Pupils are equal, round, and reactive to light.  Neck: Normal range of motion.  Cardiovascular: Normal rate.   Pulmonary/Chest: Effort normal.  Musculoskeletal: Normal range of motion.  Neurological: He is alert.  Skin: Skin is warm.  Psychiatric: His mood appears anxious. His speech is rapid and/or pressured. He is actively hallucinating. Cognition and memory are impaired. He expresses impulsivity. He expresses no homicidal and no suicidal ideation. He expresses no suicidal plans and no homicidal plans.    ED Course  Procedures (including critical care time) Labs Review Labs Reviewed  ACETAMINOPHEN LEVEL  CBC  COMPREHENSIVE METABOLIC PANEL  ETHANOL  SALICYLATE LEVEL  URINE RAPID  DRUG SCREEN (HOSP PERFORMED)    Imaging Review No results found.   EKG Interpretation None      MDM   Final diagnoses:  None         Garald Balding, NP 02/26/14 0542  Garald Balding, NP 02/26/14 [phone]

## 2014-02-26 NOTE — H&P (Signed)
[facility] UNIT H&P  Patient Identification:  [name] Date of Evaluation:  02/26/2014 Chief Complaint:  schizoeffective disorder  Subjective: Pt seen and chart reviewed. Pt continues to have active auditory hallucinations telling him to kill himself and others. Pt denies feelings of having SI or HI but reports that the voices are very persuasive. Pt reports that he has been very inconsistent with his medication. Initially, pt wanted to go home outpatient but after discussion, pt is open to stabilization of medication to alleviate the internal stimuli and is willing to come in voluntarily for treatment.   History of Present Illness:: [name] Derik Fults is an 43 y.o. male who came to Ed c/o hearing voices for the past two weeks, but his medication has not changed and he does not know why. Pt has been doing well for the past 6 or 7 years and has not been admitted for psych. Her has a history of suicide attempts in the past, but not since his son was born. Pt denies SI, depression, visual hallucinations. Pt denies having a problem with SA, Saying, "Sometimes I overmedicate a little, sometimes I undermedicate". Pt admits to some paranoia, saying that he is wondering if the Muslim owner of his apartment complex is being used by Isis to make him hear the voices, but pt says that he knows that doesn't make sense becasue the voices follow him everywhere he goes. Pt said that he was having violent fantasies to "blow up the world" if the voices don't stop, but he isn't having those thoughts anymore and has no access to weapons. Pt is calm and cooperative during assessment with casual dress and appropriate mood. Pt's feet were restless as he ate his breakfast and talked with Probation officer.  Dr. Adele Schilder is familiar with patient and will see him on rounds and determine disposition.  Total Time spent with patient: 40 minutes  Psychiatric Specialty Exam: Physical Exam Full Physical Exam performed in ED; reviewed,  stable, and I concur with this assessment.   Review of Systems  Constitutional: Negative.   HENT: Negative.   Eyes: Negative.   Respiratory: Negative.   Cardiovascular: Negative.   Gastrointestinal: Negative.   Genitourinary: Negative.   Musculoskeletal: Negative.   Skin: Negative.   Neurological: Negative.   Endo/Heme/Allergies: Negative.   Psychiatric/Behavioral: Positive for depression, hallucinations and substance abuse. The patient is nervous/anxious and has insomnia.     Blood pressure 168/92, pulse 84, temperature 97.2 F (36.2 C), temperature source Oral, resp. rate 20, height 6' (1.829 m), weight 96.163 kg (212 lb).Body mass index is 28.75 kg/(m^2).  General Appearance: Fairly Groomed  Engineer, water::  Good  Speech:  Clear and Coherent  Volume:  Normal  Mood:  Anxious and Irritable  Affect:  Appropriate  Thought Process:  Goal Directed  Orientation:  Full (Time, Place, and Person)  Thought Content:  Hallucinations: Auditory Command:  to kill self and others  Suicidal Thoughts:  Yes.  without intent/plan  Homicidal Thoughts:  Yes.  without intent/plan  Memory:  Immediate;   Fair Recent;   Fair Remote;   Fair  Judgement:  Fair  Insight:  Fair  Psychomotor Activity:  Normal  Concentration:  Fair  Recall:  [employer] [place]  Language: Fair  Akathisia:  No  Handed:    AIMS (if indicated):     Assets:  Communication Skills Desire for Improvement Resilience  Sleep:       Musculoskeletal: Strength & Muscle Tone: within normal limits  Gait & Station: normal Patient leans: N/A   Past Medical History:   Past Medical History  Diagnosis Date  . Migraines   . Schizophrenia, schizo-affective   . Chronic back pain   . Knee pain, chronic    None. Allergies:   Allergies  Allergen Reactions  . Nsaids Other (See Comments)    Rectal bleeding  . Quetiapine Other (See Comments)    Psychosis with high dose (677m)   PTA Medications: Prescriptions prior  to admission  Medication Sig Dispense Refill  . acetaminophen (TYLENOL) 500 MG tablet Take 1,000 mg by mouth every 6 (six) hours as needed for mild pain.      . Aspirin-Acetaminophen (GOODYS BODY PAIN PO) Take 1 packet by mouth daily as needed. For pain      . cyclobenzaprine (FLEXERIL) 10 MG tablet Take 10 mg by mouth 3 (three) times daily as needed for muscle spasms.      .Marland KitchenHYDROcodone-acetaminophen (NORCO) 10-325 MG per tablet Take 1 tablet by mouth 4 (four) times daily as needed for moderate pain. For pain      . omeprazole (PRILOSEC) 40 MG capsule Take 40 mg by mouth 2 (two) times daily.         Previous Psychotropic Medications:  Medication/Dose  SEE MAR               Substance Abuse History in the last 12 months:  no  Consequences of Substance Abuse: NA  Social History:  reports that he quit smoking today. His smoking use included Cigarettes. He has a 30 pack-year smoking history. He does not have any smokeless tobacco history on file. He reports that he drinks alcohol. He reports that he uses illicit drugs (Benzodiazepines). Additional Social History: Pain Medications: denies Prescriptions: Sometimes I overuse a little, sometimes I underuse my own medication Over the Counter: denies History of alcohol / drug use?: Yes Longest period of sobriety (when/how long): denies SA being a major problem Negative Consequences of Use: Financial Withdrawal Symptoms:  (denies SA a problem)                    Family History:  History reviewed. No pertinent family history.  Results for orders placed during the hospital encounter of 02/26/14 (from the past 72 hour(s))  URINE RAPID DRUG SCREEN (HOSP PERFORMED)     Status: Abnormal   Collection Time    02/26/14  5:28 AM      Result Value Ref Range   Opiates NONE DETECTED  NONE DETECTED   Cocaine NONE DETECTED  NONE DETECTED   Benzodiazepines POSITIVE (*) NONE DETECTED   Amphetamines NONE DETECTED  NONE DETECTED    Tetrahydrocannabinol NONE DETECTED  NONE DETECTED   Barbiturates NONE DETECTED  NONE DETECTED   Comment:            DRUG SCREEN FOR MEDICAL PURPOSES     ONLY.  IF CONFIRMATION IS NEEDED     FOR ANY PURPOSE, NOTIFY LAB     WITHIN 5 DAYS.                LOWEST DETECTABLE LIMITS     FOR URINE DRUG SCREEN     Drug Class       Cutoff (ng/mL)     Amphetamine      1000     Barbiturate      200     Benzodiazepine   2737    Tricyclics  300     Opiates          300     Cocaine          300     THC              50  ACETAMINOPHEN LEVEL     Status: None   Collection Time    02/26/14  6:04 AM      Result Value Ref Range   Acetaminophen (Tylenol), Serum <15.0  10 - 30 ug/mL   Comment:            THERAPEUTIC CONCENTRATIONS VARY     SIGNIFICANTLY. A RANGE OF 10-30     ug/mL MAY BE AN EFFECTIVE     CONCENTRATION FOR MANY PATIENTS.     HOWEVER, SOME ARE BEST TREATED     AT CONCENTRATIONS OUTSIDE THIS     RANGE.     ACETAMINOPHEN CONCENTRATIONS     >150 ug/mL AT 4 HOURS AFTER     INGESTION AND >50 ug/mL AT 12     HOURS AFTER INGESTION ARE     OFTEN ASSOCIATED WITH TOXIC     REACTIONS.  CBC     Status: None   Collection Time    02/26/14  6:04 AM      Result Value Ref Range   WBC 8.8  4.0 - 10.5 K/uL   RBC 5.03  4.22 - 5.81 MIL/uL   Hemoglobin 15.2  13.0 - 17.0 g/dL   HCT 44.2  39.0 - 52.0 %   MCV 87.9  78.0 - 100.0 fL   MCH 30.2  26.0 - 34.0 pg   MCHC 34.4  30.0 - 36.0 g/dL   RDW 13.0  11.5 - 15.5 %   Platelets 198  150 - 400 K/uL  COMPREHENSIVE METABOLIC PANEL     Status: Abnormal   Collection Time    02/26/14  6:04 AM      Result Value Ref Range   Sodium 140  137 - 147 mEq/L   Potassium 4.1  3.7 - 5.3 mEq/L   Chloride 103  96 - 112 mEq/L   CO2 26  19 - 32 mEq/L   Glucose, Bld 91  70 - 99 mg/dL   BUN 3 (*) 6 - 23 mg/dL   Creatinine, Ser 0.79  0.50 - 1.35 mg/dL   Calcium 9.2  8.4 - 10.5 mg/dL   Total Protein 7.0  6.0 - 8.3 g/dL   Albumin 3.8  3.5 - 5.2 g/dL   AST 27  0  - 37 U/L   ALT 44  0 - 53 U/L   Alkaline Phosphatase 74  39 - 117 U/L   Total Bilirubin 0.3  0.3 - 1.2 mg/dL   GFR calc non Af Amer >90  >90 mL/min   GFR calc Af Amer >90  >90 mL/min   Comment: (NOTE)     The eGFR has been calculated using the CKD EPI equation.     This calculation has not been validated in all clinical situations.     eGFR's persistently <90 mL/min signify possible Chronic Kidney     Disease.  ETHANOL     Status: None   Collection Time    02/26/14  6:04 AM      Result Value Ref Range   Alcohol, Ethyl (B) <11  0 - 11 mg/dL   Comment:            LOWEST DETECTABLE LIMIT FOR  SERUM ALCOHOL IS 11 mg/dL     FOR MEDICAL PURPOSES ONLY  SALICYLATE LEVEL     Status: Abnormal   Collection Time    02/26/14  6:04 AM      Result Value Ref Range   Salicylate Lvl <7.8 (*) 2.8 - 20.0 mg/dL   Psychological Evaluations:  Assessment:   DSM5:  Schizophrenia Disorders:  Schizophrenia (295.7) Depressive Disorders:  Major Depressive Disorder - Severe (296.23)  AXIS I:  Major Depression, Recurrent severe and Schizoaffective Disorder AXIS II:  Deferred AXIS III:   Past Medical History  Diagnosis Date  . Migraines   . Schizophrenia, schizo-affective   . Chronic back pain   . Knee pain, chronic    AXIS IV:  other psychosocial or environmental problems and problems related to social environment AXIS V:  31-40 impairment in reality testing  Treatment Plan/Recommendations:   Review of chart, vital signs, medications, and notes.  1-Medication management for depression and anxiety: Medications reviewed with the patient and she stated no untoward effects, unchanged. 2-Coping skills for depression, anxiety  3-Continue crisis stabilization and management  4-Address health issues--monitoring vital signs, stable  5-Treatment plan in progress to prevent relapse of depression and anxiety  Treatment Plan Summary: Daily contact with patient to assess and evaluate symptoms and  progress in treatment Medication management Current Medications:  Current Facility-Administered Medications  Medication Dose Route Frequency Provider Last Rate Last Dose  . acetaminophen (TYLENOL) tablet 650 mg  650 mg Oral Q6H PRN Shuvon Rankin, NP      . ALPRAZolam (XANAX) tablet 0.5 mg  0.5 mg Oral TID PRN Shuvon Rankin, NP      . magnesium hydroxide (MILK OF MAGNESIA) suspension 30 mL  30 mL Oral Daily PRN Shuvon Rankin, NP      . [START ON 02/27/2014] nicotine (NICODERM CQ - dosed in mg/24 hours) patch 21 mg  21 mg Transdermal Daily Shuvon Rankin, NP      . ondansetron (ZOFRAN) tablet 4 mg  4 mg Oral Q8H PRN Shuvon Rankin, NP      . ziprasidone (GEODON) capsule 40 mg  40 mg Oral BID WC Shuvon Rankin, NP          Benjamine Mola, FNP-BC 6/22/20153:45 PM

## 2014-02-26 NOTE — ED Notes (Signed)
tts in w/ pt 

## 2014-02-26 NOTE — ED Provider Notes (Signed)
Medical screening examination/treatment/procedure(s) were performed by non-physician practitioner and as supervising physician I was immediately available for consultation/collaboration.   EKG Interpretation   Date/Time:  Sunday February 26 2014 13:36:23 EDT Ventricular Rate:  55 PR Interval:  134 QRS Duration: 94 QT Interval:  424 QTC Calculation: 405 R Axis:   76 Text Interpretation:  Sinus bradycardia Incomplete right bundle branch  block Borderline ECG Confirmed by Jeneen Rinks  MD, [place] ([id number]) on 02/26/2014  1:44:39 PM        Alfonzo Feller, DO 02/26/14 1507

## 2014-02-26 NOTE — ED Notes (Signed)
Dr Adele Schilder and shuvon into see
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
## 2014-02-26 NOTE — Progress Notes (Signed)
Patient complained of anxiety. Stated "I am anxious; I need something to calm me down. I also have reflux, gas, coming up to my throat I cannot handle that" PRN meds. was given (see MAR). Medication was effective. Patient sleeping quietly at this time. Safety and every 15 minutes checked maintained.

## 2014-02-26 NOTE — Plan of Care (Signed)
[facility] Observation Crisis Plan  Reason for Crisis Plan:  Chronic Mental Illness/Medical Illness   Plan of Care:  Referral for Inpatient Hospitalization  Family Support:      Current Living Environment:  Living Arrangements: Alone  Insurance:   Hospital Account   Name Acct ID Class Status Primary Coverage   [name], [name] [id number] [place]        Guarantor Account (for Hospital Account [id number])   Name Relation to Pt Service Area Active? Acct Type   [place], [name] Self CHSA Yes Behavioral Health   Address Phone       [address] [place], [place] [address] [phone])          Coverage Information (for Hospital Account [id number])   [place]   F/O Payor/Plan Precert #   [facility] MEDICARE/AARP MEDICARE COMPLETE    Subscriber Subscriber #   [name], [name] [id number]   [place] [address] [phone]       2. [place]   F/[place] Payor/Plan Precert #   [place]    Subscriber Subscriber #   [name], [name] [id number]   Address Phone   [address] [place], [place] [address] [phone]          Legal Guardian:     Primary Care Provider:  Woody Seller, MD  Current Outpatient Providers:  Primary care providing psychotropics  Psychiatrist:     Counselor/Therapist:     Compliant with Medications:  No  Additional Information:   [name] 6/21/20154:00 PM

## 2014-02-26 NOTE — Progress Notes (Signed)
Patient ID: [name], male   DOB: Nov 16, 1970, 43 y.o.   MRN: [id number]  Patient admitted to obs. Complaining of back/leg pain. Placed call to [facility] NP to determine if Norco was going to be ordered. Oriented to unit. Nutrition offered. Education provided regarding safety and falls. Safety checks started every 15 minutes.

## 2014-02-26 NOTE — ED Notes (Addendum)
Pt ambulatory [facility] w/o difficulty w/ pehlam.  Belongings given to driver

## 2014-02-26 NOTE — ED Notes (Signed)
Up to the bathroom, ambulatory w/o difficulty 

## 2014-02-26 NOTE — ED Notes (Addendum)
Pt requesting norco and xanax.  PA aware.  Pt also reports that he can not take tylenol, but can take asprin. Reports that he takes norco 4 times a day for chronic neck/back pain and 6 mg of xanax a day to control the voices

## 2014-02-26 NOTE — ED Notes (Signed)
tts into see 

## 2014-02-26 NOTE — Progress Notes (Signed)
Complaining of pain. Called NP re Norco. Given Tylenol to help with pain until  Norco can be verified with patients outside pharmacy.

## 2014-02-26 NOTE — ED Notes (Signed)
Patient ambulatory to room 35 from triage

## 2014-02-26 NOTE — Consult Note (Addendum)
  Patient states that he came to ED related to hearing voices.  Patient states that his medication is not  Working. Patient states that he took Geodon in the past and it worked well.  Patient states that the generic Tish Frederickson does not work Brand name worked better.  Dr. Adele Schilder is familiar with this patient and that patient is not at baseline at this time.    Agree with TTS assessment.    Disposition:  Recommend starting patient on Geodon 40 mg Bid, EKG 12 lead for base line.;and observation over night.   Patient has been accepted to [facility] Observation Unit.      Shuvon B. Rankin FNP-BC  I have personally seen the patient and agreed with the findings and involved in the treatment plan. Berniece Andreas, MD    Addendum:  Patient pain medication verified.  Narco 10/324 [facility] last filled 02/11/14,  Flexeril 10 mg

## 2014-02-27 DIAGNOSIS — M549 Dorsalgia, unspecified: Secondary | ICD-10-CM | POA: Diagnosis present

## 2014-02-27 DIAGNOSIS — G4700 Insomnia, unspecified: Secondary | ICD-10-CM | POA: Diagnosis present

## 2014-02-27 DIAGNOSIS — F410 Panic disorder [episodic paroxysmal anxiety] without agoraphobia: Secondary | ICD-10-CM | POA: Diagnosis present

## 2014-02-27 DIAGNOSIS — S61409A Unspecified open wound of unspecified hand, initial encounter: Secondary | ICD-10-CM | POA: Diagnosis not present

## 2014-02-27 DIAGNOSIS — K219 Gastro-esophageal reflux disease without esophagitis: Secondary | ICD-10-CM | POA: Diagnosis present

## 2014-02-27 DIAGNOSIS — S60229A Contusion of unspecified hand, initial encounter: Secondary | ICD-10-CM | POA: Diagnosis not present

## 2014-02-27 DIAGNOSIS — F1320 Sedative, hypnotic or anxiolytic dependence, uncomplicated: Secondary | ICD-10-CM | POA: Diagnosis present

## 2014-02-27 DIAGNOSIS — F259 Schizoaffective disorder, unspecified: Secondary | ICD-10-CM | POA: Diagnosis present

## 2014-02-27 DIAGNOSIS — F17200 Nicotine dependence, unspecified, uncomplicated: Secondary | ICD-10-CM | POA: Diagnosis present

## 2014-02-27 DIAGNOSIS — G8929 Other chronic pain: Secondary | ICD-10-CM | POA: Diagnosis present

## 2014-02-27 DIAGNOSIS — F411 Generalized anxiety disorder: Secondary | ICD-10-CM | POA: Diagnosis present

## 2014-02-27 DIAGNOSIS — F29 Unspecified psychosis not due to a substance or known physiological condition: Secondary | ICD-10-CM | POA: Diagnosis present

## 2014-02-27 DIAGNOSIS — Y9210 Unspecified residential institution as the place of occurrence of the external cause: Secondary | ICD-10-CM | POA: Diagnosis not present

## 2014-02-27 DIAGNOSIS — Z5987 Material hardship: Secondary | ICD-10-CM | POA: Diagnosis not present

## 2014-02-27 DIAGNOSIS — W2209XA Striking against other stationary object, initial encounter: Secondary | ICD-10-CM | POA: Diagnosis not present

## 2014-02-27 DIAGNOSIS — F332 Major depressive disorder, recurrent severe without psychotic features: Secondary | ICD-10-CM | POA: Diagnosis present

## 2014-02-27 MED ORDER — CYCLOBENZAPRINE HCL 10 MG PO TABS: 10.0000 mg | ORAL_TABLET | Freq: Three times a day (TID) | ORAL | Status: DC | PRN

## 2014-02-27 MED ORDER — HYDROXYZINE HCL 50 MG PO TABS
50.0000 mg | ORAL_TABLET | Freq: Once | ORAL | Status: AC
  Administered 2014-02-27: 50 mg via ORAL
  Filled 2014-02-27 (×2): qty 1

## 2014-02-27 MED ORDER — HYDROCODONE-ACETAMINOPHEN 10-325 MG PO TABS
1.0000 | ORAL_TABLET | Freq: Four times a day (QID) | ORAL | Status: DC | PRN
  Administered 2014-02-27 – 2014-02-28 (×2): 1 via ORAL
  Administered 2014-03-01: 2 via ORAL
  Filled 2014-02-27: qty 2
  Filled 2014-02-27 (×2): qty 1

## 2014-02-27 MED ORDER — LORAZEPAM 2 MG/ML IJ SOLN
2.0000 mg | Freq: Once | INTRAMUSCULAR | Status: AC
  Administered 2014-02-27: 2 mg via INTRAMUSCULAR
  Filled 2014-02-27: qty 1

## 2014-02-27 MED ORDER — HALOPERIDOL 5 MG PO TABS
5.0000 mg | ORAL_TABLET | Freq: Once | ORAL | Status: AC
  Administered 2014-02-27: 5 mg via ORAL
  Filled 2014-02-27 (×3): qty 1

## 2014-02-27 NOTE — Progress Notes (Signed)
Pt. Finally settles down and fell asleep at 3;30am. Remained asleep at this time. 15 minutes check for safety

## 2014-02-27 NOTE — Progress Notes (Addendum)
Pt was given 650 mg tylenol for a headache a 6/10. He was also given xanax .5mg  for his nerves. Report was given to [facility] and pt was admitted to the 500 hall. He was taken to the search room by [facility] and then will be oriented to the 500 hall. Pt denies SI and HI at this time and contracts for safety. He did ask for hydrocodone however no order so pt was given tylenol which earlier this am relieved his headache.

## 2014-02-27 NOTE — Progress Notes (Signed)
Patient has completed Discharge Planning worksheet with minimal help from staff.

## 2014-02-27 NOTE — Progress Notes (Signed)
Patient woke up at 1 am. Pacing, requesting for something to put him to sleep. Serena Colonel NP notified. Stat order vistaril 50 mg given. Patient continued pacing,frequenting the nurse's table stating "I still can't sleep, I'm withdrawing from hydrocodone, blood is running on my head. I still hear voices. I need something. This is not workingTeaching laboratory technician advised patient to give the medication time to work. Patient continued pacing the unit; became increasingly agitated and disruptive. Manus Gunning NP notified the second time. Ordered Lorazepam 2 mg (IM) STAT at 2:30am. Patient still on and off his bed. Safety maintained. Will continue to monitor patient.

## 2014-02-27 NOTE — Progress Notes (Signed)
D:Pt reports that he has voices and when asked what the voices were saying, he stated that he tries to ignore them. Pt is able to talk with writer without responding verbally to the voices. Pt c/o of a headache when he woke up to take his morning medication.  A:Offered support and 15 minute checks. Gave prn medication for his headache and schedule medication.  R:Pt denies si and hi. Safety maintained on the observation unit.

## 2014-02-27 NOTE — Progress Notes (Signed)
The focus of this group is to help patients review their daily goal of treatment and discuss progress on daily workbooks. Pt attended the evening group session and responded to all discussion prompts from the [place]. Pt shared that today was a good day on account of his Mother and Son coming to visit him. Pt's only additional request from Nursing Staff this evening was to receive towels, which were given to him following group. Pt said that upon discharge he planned to eat more vegetables to improve his bowel health as a way to maintain wellness. Pt's affect was appropriate and he volunteered several encouraging comments to his peers.

## 2014-02-27 NOTE — Progress Notes (Signed)
D Pt. Denies SI and HI, no complaints of pain or discomfort.  A Writer offered support and encouragement.  Discussed scheduled meds with pt.  R PT. Remains safe on the unit.  Pt. Appears to be med seeking but wants control over his medications, stating he will take some and refuses others.

## 2014-02-27 NOTE — Plan of Care (Signed)
[facility] Observation Crisis Plan  Reason for Crisis Plan:  Crisis Stabilization   Plan of Care:  Referral for Inpatient Hospitalization  Family Support:    Mother: Judd Lien; Sister: Dontea Corlew; FianceeAngela Nevin May; Several friends: Ardell Isaacs, Wilma, Melanie; pt also has a 45 y/o son  Current Living Environment:  Living Arrangements: Alone; has Section 8 housing, stable living environment.  Insurance:   Hospital Account   Name Acct ID Class Status Primary Coverage   [place]os, [place] Anthony [id number] [facility] UNITED HEALTHCARE MEDICARE - AARP MEDICARE COMPLETE        Guarantor Account (for Hospital Account [id number])   Name Relation to Pt Service Area Active? Acct Type   Males, [place] Anthony Self CHSA Yes Behavioral Health   Address Phone       [address] [place], [place] [address] [phone])          Coverage Information (for Hospital Account [id number])   [place]   F/O Payor/Plan Precert #   [facility] MEDICARE/AARP MEDICARE COMPLETE    Subscriber Subscriber #   [place], Mal[place] Anthony [id number]   [place]   [address] [address] [phone]       2. [place]   F/O Payor/Plan Precert #   [place]    Subscriber Subscriber #   [place], Mali Anthony [id number] L   Address Phone   [address] [place], [place] [address] [phone]          Legal Guardian:   Self  Primary Care Provider:  Woody Seller, MD  Current Outpatient Providers:  None currently  Psychiatrist:   None currently  Counselor/Therapist:   None currently  Compliant with Medications:  No  Additional Information: After consulting with Catalina Pizza, NP it has been determined that pt would benefit from psychiatric hospitalization.  Accepted to [facility] to the service of Corena Pilgrim, MD, Rm 403-1.  Pt signed Voluntary  Admission and Consent for Treatment.  He also signed Consent to Release Information to his PCP, Kathryne Eriksson, MD, who prescribes all of his medications including psychotropic.  Jalene Mullet, [place] Triage Specialist Ysidro Evert [place] 6/22/20153:35 PM
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
## 2014-02-28 MED ORDER — MIRTAZAPINE 15 MG PO TABS
15.0000 mg | ORAL_TABLET | Freq: Every day | ORAL | Status: DC
  Administered 2014-02-28: 15 mg via ORAL
  Filled 2014-02-28 (×4): qty 1

## 2014-02-28 MED ORDER — ZIPRASIDONE HCL 60 MG PO CAPS
60.0000 mg | ORAL_CAPSULE | Freq: Two times a day (BID) | ORAL | Status: DC
  Administered 2014-02-28 – 2014-03-01 (×2): 60 mg via ORAL
  Filled 2014-02-28 (×6): qty 1

## 2014-02-28 MED ORDER — HYDROXYZINE HCL 50 MG PO TABS
50.0000 mg | ORAL_TABLET | Freq: Four times a day (QID) | ORAL | Status: DC | PRN
  Administered 2014-02-28 – 2014-03-02 (×4): 50 mg via ORAL
  Filled 2014-02-28 (×4): qty 1

## 2014-02-28 MED ORDER — ALBUTEROL SULFATE (2.5 MG/3ML) 0.083% IN NEBU: 2.5000 mg | INHALATION_SOLUTION | RESPIRATORY_TRACT | Status: DC | PRN

## 2014-02-28 MED ORDER — OLANZAPINE 10 MG PO TBDP
10.0000 mg | ORAL_TABLET | Freq: Three times a day (TID) | ORAL | Status: DC | PRN
  Administered 2014-03-01 – 2014-03-05 (×11): 10 mg via ORAL
  Filled 2014-02-28 (×11): qty 1

## 2014-02-28 NOTE — Progress Notes (Signed)
Adult Psychoeducational Group Note  Date:  02/28/2014 Time:  10:15 PM  Group Topic/Focus:  Wrap-Up Group:   The focus of this group is to help patients review their daily goal of treatment and discuss progress on daily workbooks.  Participation Level:  Active  Participation Quality:  Appropriate and Intrusive  Affect:  Labile  Cognitive:  Appropriate  Insight: Limited  Engagement in Group:  Engaged  Modes of Intervention:  Socialization and Support  Additional Comments:  Patient attended and participated in group tonight. He reports having a good day. He had visitors, slept, went for his meals and attended group. He describe the day as "a long day".  Patient advised that the doctor adjusted his medication today and it has been difficult. Today he enjoyed socializing with peers. He plans to not drink or smoke anymore. Plans to stop seeing his girlfriend because she represent everything he is not suppose to do.  [place] [facility] 02/28/2014, 10:15 PM
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
## 2014-02-28 NOTE — Progress Notes (Signed)
D: Pt up pacing the halls. Pt requesting something for dry eyes. Pt received normal saline to wash his eyes. Pt then wanted to know how much was his dose of Haldol. He complained how haldol " suppresses his central nervous system and makes him drool by knocking him out". He refused it earlier when his 7-11p RN offered it to him. Pt is now requesting to take the Haldol. Pt had difficulty sleeping due to anxiety and his roommate snoring loudly.  A: Writer administered Haldol to pt. Pt was offered the quiet room due to no other rooms on the hall being available. Haldol was followed up with xanax. Continued support and availability as needed was extended to this pt. Staff continue to monitor pt with q45min checks.  R: No adverse drug reactions noted. Pt's anxiety and insomnia minimally decreased after the administration of Haldol and Xanax. Pt receptive to treatment. Pt remains safe at this time.

## 2014-02-28 NOTE — Progress Notes (Signed)
Pt presents anxious this morning. Pt labile and easily agitated. Pt sarcastic and have been observed cursing and talking about others on the milieu. Pt med seeking this morning and continued to request for xanax. Pt stated that he takes a lot of xanax at home because that's the only thing that helps him. Pt stated that he have a past hx of more than 20 overdose attempts. Pt reports hearing voices but cannot make out what they are saying. Reports hearing voices coming from the [employer] [facility]. Pt reports poor sleep and stated that he was up all night. Pt has flight of ideas, disorganized thoughts and tangential speech. Medications administered to pt as ordered per MD. Med reviewed by Probation officer and discussed with pt. Pt encouraged to attend groups. Pt requires redirecting by staff for inappropriate behaviors. 15 minute checks performed for safety. Pt safety maintained. Pt non-receptive to treatment and continues to be med seeking throughout the day.

## 2014-02-28 NOTE — [facility] Group Notes (Signed)
[facility] LCSW Group Therapy  02/28/2014   11:00 AM   Type of Therapy:  Group Therapy  Participation Level:  Active  Participation Quality:  Attentive, Sharing and Supportive but distracting and off topic  Affect:  Manic  Cognitive:  Alert and Oriented  Insight:  Limited, Lacking  Engagement in Therapy:  Limited, Lacking  Modes of Intervention:  Clarification, Confrontation, Discussion, Education, Exploration, Limit-setting, Orientation, Problem-solving, Rapport Building, Art therapist, Socialization and Support  Summary of Progress/Problems: The topic for group therapy was feelings about diagnosis.  Pt actively participated in group discussion on their past and current diagnosis and how they feel towards this.  Pt also identified how society and family members judge them, based on their diagnosis as well as stereotypes and stigmas.  Pt was observed repeating what others say and thinking they were talking about him, when they were talking about themselves.  Pt had to be redirected numerous times from side conversation and talking to himself/repeating others.  Pt responded well to redirection but continued to have a hard time talking over others.    Regan Lemming, LCSW 02/28/2014  12:12 PM

## 2014-02-28 NOTE — Discharge Summary (Signed)
[name] (to inpatient status)  Patient Identification:  [name] Date of Evaluation:  02/26/2014 Chief Complaint:  schizoeffective disorder  Subjective: Pt seen and chart reviewed. Pt continues to have active auditory hallucinations telling him to kill himself and others. Pt denies feelings of having SI or HI but reports that the voices are very persuasive. Pt reports that he has been very inconsistent with his medication. Initially, pt wanted to go home outpatient but after discussion, pt is open to stabilization of medication to alleviate the internal stimuli and is willing to come in voluntarily for treatment. Pt will be admitted to [facility] inpatient for management and stabilization.   History of Present Illness:: [name] is an 43 y.o. male who came to Ed c/o hearing voices for the past two weeks, but his medication has not changed and he does not know why. Pt has been doing well for the past 6 or 7 years and has not been admitted for psych. Her has a history of suicide attempts in the past, but not since his son was born. Pt denies SI, depression, visual hallucinations. Pt denies having a problem with SA, Saying, "Sometimes I overmedicate a little, sometimes I undermedicate". Pt admits to some paranoia, saying that he is wondering if the Muslim owner of his apartment complex is being used by Isis to make him hear the voices, but pt says that he knows that doesn't make sense becasue the voices follow him everywhere he goes. Pt said that he was having violent fantasies to "blow up the world" if the voices don't stop, but he isn't having those thoughts anymore and has no access to weapons. Pt is calm and cooperative during assessment with casual dress and appropriate mood. Pt's feet were restless as he ate his breakfast and talked with Probation officer.  Dr. Adele Schilder is familiar with patient and will see him on rounds and determine disposition.  Total Time spent with patient: 40  minutes  Psychiatric Specialty Exam: Physical Exam Full Physical Exam performed in ED; reviewed, stable, and I concur with this assessment.   Review of Systems  Constitutional: Negative.   HENT: Negative.   Eyes: Negative.   Respiratory: Negative.   Cardiovascular: Negative.   Gastrointestinal: Negative.   Genitourinary: Negative.   Musculoskeletal: Negative.   Skin: Negative.   Neurological: Negative.   Endo/Heme/Allergies: Negative.   Psychiatric/Behavioral: Positive for depression, hallucinations and substance abuse. The patient is nervous/anxious and has insomnia.     Blood pressure 168/92, pulse 84, temperature 97.2 F (36.2 C), temperature source Oral, resp. rate 20, height 6' (1.829 m), weight 96.163 kg (212 lb).Body mass index is 28.75 kg/(m^2).  General Appearance: Fairly Groomed  Engineer, water::  Good  Speech:  Clear and Coherent  Volume:  Normal  Mood:  Anxious and Irritable  Affect:  Appropriate  Thought Process:  Goal Directed  Orientation:  Full (Time, Place, and Person)  Thought Content:  Hallucinations: Auditory Command:  to kill self and others  Suicidal Thoughts:  Yes.  without intent/plan  Homicidal Thoughts:  Yes.  without intent/plan  Memory:  Immediate;   Fair Recent;   Fair Remote;   Fair  Judgement:  Fair  Insight:  Fair  Psychomotor Activity:  Normal  Concentration:  Fair  Recall:  [employer] [place]  Language: Fair  Akathisia:  No  Handed:    AIMS (if indicated):     Assets:  Communication Skills Desire for Improvement Resilience  Sleep:       Musculoskeletal: Strength & Muscle Tone: within normal limits Gait & Station: normal Patient leans: N/A   Past Medical History:   Past Medical History  Diagnosis Date  . Migraines   . Schizophrenia, schizo-affective   . Chronic back pain   . Knee pain, chronic    None. Allergies:   Allergies  Allergen Reactions  . Nsaids Other (See Comments)    Rectal bleeding  . Quetiapine  Other (See Comments)    Psychosis with high dose (616m)   PTA Medications: Prescriptions prior to admission  Medication Sig Dispense Refill  . acetaminophen (TYLENOL) 500 MG tablet Take 1,000 mg by mouth every 6 (six) hours as needed for mild pain.      . Aspirin-Acetaminophen (GOODYS BODY PAIN PO) Take 1 packet by mouth daily as needed. For pain      . cyclobenzaprine (FLEXERIL) 10 MG tablet Take 10 mg by mouth 3 (three) times daily as needed for muscle spasms.      .Marland KitchenHYDROcodone-acetaminophen (NORCO) 10-325 MG per tablet Take 1 tablet by mouth 4 (four) times daily as needed for moderate pain. For pain      . omeprazole (PRILOSEC) 40 MG capsule Take 40 mg by mouth 2 (two) times daily.         Previous Psychotropic Medications:  Medication/Dose  SEE MAR               Substance Abuse History in the last 12 months:  no  Consequences of Substance Abuse: NA  Social History:  reports that he quit smoking today. His smoking use included Cigarettes. He has a 30 pack-year smoking history. He does not have any smokeless tobacco history on file. He reports that he drinks alcohol. He reports that he uses illicit drugs (Benzodiazepines). Additional Social History: Pain Medications: denies Prescriptions: Sometimes I overuse a little, sometimes I underuse my own medication Over the Counter: denies History of alcohol / drug use?: Yes Longest period of sobriety (when/how long): denies SA being a major problem Negative Consequences of Use: Financial Withdrawal Symptoms:  (denies SA a problem)                    Family History:  History reviewed. No pertinent family history.  Results for orders placed during the hospital encounter of 02/26/14 (from the past 72 hour(s))  URINE RAPID DRUG SCREEN (HOSP PERFORMED)     Status: Abnormal   Collection Time    02/26/14  5:28 AM      Result Value Ref Range   Opiates NONE DETECTED  NONE DETECTED   Cocaine NONE DETECTED  NONE DETECTED    Benzodiazepines POSITIVE (*) NONE DETECTED   Amphetamines NONE DETECTED  NONE DETECTED   Tetrahydrocannabinol NONE DETECTED  NONE DETECTED   Barbiturates NONE DETECTED  NONE DETECTED   Comment:            DRUG SCREEN FOR MEDICAL PURPOSES     ONLY.  IF CONFIRMATION IS NEEDED     FOR ANY PURPOSE, NOTIFY LAB     WITHIN 5 DAYS.                LOWEST DETECTABLE LIMITS     FOR URINE DRUG SCREEN     Drug Class       Cutoff (ng/mL)     Amphetamine      1000     Barbiturate      200  Benzodiazepine   893     Tricyclics       810     Opiates          300     Cocaine          300     THC              50  ACETAMINOPHEN LEVEL     Status: None   Collection Time    02/26/14  6:04 AM      Result Value Ref Range   Acetaminophen (Tylenol), Serum <15.0  10 - 30 ug/mL   Comment:            THERAPEUTIC CONCENTRATIONS VARY     SIGNIFICANTLY. A RANGE OF 10-30     ug/mL MAY BE AN EFFECTIVE     CONCENTRATION FOR MANY PATIENTS.     HOWEVER, SOME ARE BEST TREATED     AT CONCENTRATIONS OUTSIDE THIS     RANGE.     ACETAMINOPHEN CONCENTRATIONS     >150 ug/mL AT 4 HOURS AFTER     INGESTION AND >50 ug/mL AT 12     HOURS AFTER INGESTION ARE     OFTEN ASSOCIATED WITH TOXIC     REACTIONS.  CBC     Status: None   Collection Time    02/26/14  6:04 AM      Result Value Ref Range   WBC 8.8  4.0 - 10.5 K/uL   RBC 5.03  4.22 - 5.81 MIL/uL   Hemoglobin 15.2  13.0 - 17.0 g/dL   HCT 44.2  39.0 - 52.0 %   MCV 87.9  78.0 - 100.0 fL   MCH 30.2  26.0 - 34.0 pg   MCHC 34.4  30.0 - 36.0 g/dL   RDW 13.0  11.5 - 15.5 %   Platelets 198  150 - 400 K/uL  COMPREHENSIVE METABOLIC PANEL     Status: Abnormal   Collection Time    02/26/14  6:04 AM      Result Value Ref Range   Sodium 140  137 - 147 mEq/L   Potassium 4.1  3.7 - 5.3 mEq/L   Chloride 103  96 - 112 mEq/L   CO2 26  19 - 32 mEq/L   Glucose, Bld 91  70 - 99 mg/dL   BUN 3 (*) 6 - 23 mg/dL   Creatinine, Ser 0.79  0.50 - 1.35 mg/dL   Calcium 9.2  8.4 -  10.5 mg/dL   Total Protein 7.0  6.0 - 8.3 g/dL   Albumin 3.8  3.5 - 5.2 g/dL   AST 27  0 - 37 U/L   ALT 44  0 - 53 U/L   Alkaline Phosphatase 74  39 - 117 U/L   Total Bilirubin 0.3  0.3 - 1.2 mg/dL   GFR calc non Af Amer >90  >90 mL/min   GFR calc Af Amer >90  >90 mL/min   Comment: (NOTE)     The eGFR has been calculated using the CKD EPI equation.     This calculation has not been validated in all clinical situations.     eGFR's persistently <90 mL/min signify possible Chronic Kidney     Disease.  ETHANOL     Status: None   Collection Time    02/26/14  6:04 AM      Result Value Ref Range   Alcohol, Ethyl (B) <11  0 - 11 mg/dL   Comment:  LOWEST DETECTABLE LIMIT FOR     SERUM ALCOHOL IS 11 mg/dL     FOR MEDICAL PURPOSES ONLY  SALICYLATE LEVEL     Status: Abnormal   Collection Time    02/26/14  6:04 AM      Result Value Ref Range   Salicylate Lvl <6.2 (*) 2.8 - 20.0 mg/dL   Psychological Evaluations:  Assessment:   DSM5:  Schizophrenia Disorders:  Schizophrenia (295.7) Depressive Disorders:  Major Depressive Disorder - Severe (296.23)  AXIS I:  Major Depression, Recurrent severe and Schizoaffective Disorder AXIS II:  Deferred AXIS III:   Past Medical History  Diagnosis Date  . Migraines   . Schizophrenia, schizo-affective   . Chronic back pain   . Knee pain, chronic    AXIS IV:  other psychosocial or environmental problems and problems related to social environment AXIS V:  31-40 impairment in reality testing  Treatment Plan/Recommendations:   Review of chart, vital signs, medications, and notes.  1-Medication management for depression and anxiety: Medications reviewed with the patient and she stated no untoward effects, unchanged. 2-Coping skills for depression, anxiety  3-Continue crisis stabilization and management  4-Address health issues--monitoring vital signs, stable  5-Treatment plan in progress to prevent relapse of depression and  anxiety  Treatment Plan [name]: Daily contact with patient to assess and evaluate symptoms and progress in treatment Medication management Current Medications:  Current Facility-Administered Medications  Medication Dose Route Frequency Provider Last Rate Last Dose  . acetaminophen (TYLENOL) tablet 650 mg  650 mg Oral Q6H PRN Shuvon Rankin, NP      . ALPRAZolam (XANAX) tablet 0.5 mg  0.5 mg Oral TID PRN Shuvon Rankin, NP      . magnesium hydroxide (MILK OF MAGNESIA) suspension 30 mL  30 mL Oral Daily PRN Shuvon Rankin, NP      . [START ON 02/27/2014] nicotine (NICODERM CQ - dosed in mg/24 hours) patch 21 mg  21 mg Transdermal Daily Shuvon Rankin, NP      . ondansetron (ZOFRAN) tablet 4 mg  4 mg Oral Q8H PRN Shuvon Rankin, NP      . ziprasidone (GEODON) capsule 40 mg  40 mg Oral BID WC Shuvon Rankin, NP          Benjamine Mola, FNP-BC 02/27/2014 3:45 PM

## 2014-02-28 NOTE — [facility] Suicide Risk Assessment (Signed)
[facility] INPATIENT: Family/Significant Other Suicide Prevention Education   Suicide Prevention Education:  Education Completed; No one has been identified by the patient as the family member/significant other with whom the patient will be residing, and identified as the person(s) who will aid the patient in the event of a mental health crisis (suicidal ideations/suicide attempt).   Pt did not c/o SI at admission, nor have they endorsed SI during their stay here. SPE not required. SPI pamphlet provided to pt and he was encouraged to share information with his support network, ask questions, and talk about any concerns.   The suicide prevention education provided includes the following:  Suicide risk factors  Suicide prevention and interventions  National Suicide Hotline telephone number  [facility] assessment telephone number  [facility] Emergency Assistance [place] and/or Residential Mobile Crisis Unit telephone number  Regan Lemming, [place] 02/28/2014  8:59 AM

## 2014-02-28 NOTE — H&P (Signed)
Psychiatric Admission Assessment Adult  Patient Identification:  [name] Date of Evaluation:  02/28/2014 Chief Complaint:  "I was hearing voices coming from the air conditioner."  History of Present Illness::  [name] Dipiero is a 43 year old male who came to the [facility] due to hearing voices for the past two weeks. The patient reports that he has not been hospitalized in several years. In the ED the patient admitted to overusing his xanax. Patient states today during his psychiatric assessment "I was hearing voices for a few weeks. I hear them coming out of the air conditioner. I need more xanax. I get really mean and have panic attacks. I would not overdose on xanax. Because I like it. I have bad anxiety. I was diagnosed as schizoaffective back in 1989. I am not sure what triggered my symptoms." During the assessment the patient denied feeling paranoid. The assessment note completed by the counselor at [facility] indicates that the patient was afraid that the Muslim apartment apartment complex owner was working with Is[employer] to make him hear voices. Today the patient is very focused on getting his xanax dosage increased. He was informed by treatment team members that this was not the treatment of choice for his diagnosis. Upon leaving the interview the patient proclaimed that he would go just drink milk to cope with his anxiety. The patient appeared very anxious and restless today during conversation.   Elements:  Location:  Adult unit for psychosis . Quality:  Auditory Hallucinations. Severity:  Severe. Timing:  Last two weeks. Duration:  Chronic. Context:  Benzo abuse, violent fantasies, psychotic symptoms. Associated Signs/Synptoms: Depression Symptoms:  depressed mood, anhedonia, insomnia, psychomotor agitation, difficulty concentrating, (Hypo) Manic Symptoms: Denies Anxiety Symptoms:  Panic Symptoms, Psychotic Symptoms:  Delusions, Hallucinations: Auditory PTSD Symptoms: Negative Total  Time spent with patient: 45 minutes  Psychiatric Specialty Exam: Physical Exam  Constitutional:  Physical exam findings reviewed from the [facility] and I concur with no noted exceptions.   Psychiatric: His mood appears anxious. His affect is labile. His speech is rapid and/or pressured. He is hyperactive and actively hallucinating. Thought content is delusional. He expresses impulsivity. He is inattentive.    Review of Systems  Constitutional: Negative.   HENT: Negative.   Eyes: Negative.   Respiratory: Negative.   Cardiovascular: Negative.   Gastrointestinal: Negative.   Genitourinary: Negative.   Musculoskeletal: Positive for back pain and myalgias.  Skin: Negative.   Neurological: Negative.   Endo/Heme/Allergies: Negative.   Psychiatric/Behavioral: Positive for hallucinations. The patient is nervous/anxious and has insomnia.     Blood pressure 135/88, pulse 66, temperature 98.6 F (37 C), temperature source Oral, resp. rate 20, height 6' (1.829 m), weight 96.163 kg (212 lb), SpO2 95.00%.Body mass index is 28.75 kg/(m^2).  General Appearance: Casual  Eye Contact::  Good  Speech:  Pressured and Slurred  Volume:  Increased  Mood:  Euphoric  Affect:  Labile  Thought Process:  Circumstantial and Disorganized  Orientation:  Full (Time, Place, and Person)  Thought Content:  Delusions and Hallucinations: Auditory  Suicidal Thoughts:  No  Homicidal Thoughts:  No  Memory:  Immediate;   Fair Recent;   Fair Remote;   Fair  Judgement:  Impaired  Insight:  Lacking  Psychomotor Activity:  Increased  Concentration:  Poor  Recall:  [place]  Language: Fair  Akathisia:  No  Handed:  Right  AIMS (if indicated):     Assets:  Communication Skills Desire for Improvement  Physical Health  Sleep:  Number of Hours: 0    Musculoskeletal: Strength & Muscle Tone: within normal limits Gait & Station: normal Patient leans: N/A  Past Psychiatric  History:Yes Diagnosis:Schizoaffective Disorder  Hospitalizations:[facility], [place] [facility], [place]  Outpatient Care: Denies  Substance Abuse Care:Denies  Self-Mutilation:Denies  Suicidal Attempts:Reports history of more than twenty overdoses on various medications such as Seroquel and Zyprexa.   Violent Behaviors:Potential    Past Medical History:   Past Medical History  Diagnosis Date  . Migraines   . Schizophrenia, schizo-affective   . Chronic back pain   . Knee pain, chronic    None. Allergies:   Allergies  Allergen Reactions  . Nsaids Other (See Comments)    Rectal bleeding  . Quetiapine Other (See Comments)    Psychosis with high dose (656m)   PTA Medications: Prescriptions prior to admission  Medication Sig Dispense Refill  . acetaminophen (TYLENOL) 500 MG tablet Take 1,000 mg by mouth every 6 (six) hours as needed for mild pain.      . Aspirin-Acetaminophen (GOODYS BODY PAIN PO) Take 1 packet by mouth daily as needed. For pain      . cyclobenzaprine (FLEXERIL) 10 MG tablet Take 10 mg by mouth 3 (three) times daily as needed for muscle spasms.      .Marland KitchenHYDROcodone-acetaminophen (NORCO) 10-325 MG per tablet Take 1 tablet by mouth 4 (four) times daily as needed for moderate pain. For pain      . omeprazole (PRILOSEC) 40 MG capsule Take 40 mg by mouth 2 (two) times daily.         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   Social History:  reports that he quit smoking 2 days ago. His smoking use included Cigarettes. He has a 30 pack-year smoking history. He does not have any smokeless tobacco history on file. He reports that he drinks alcohol. He reports that he uses illicit drugs (Benzodiazepines). Additional Social History: Pain Medications:  denies Prescriptions: Sometimes I overuse a little, sometimes I underuse my own medication Over the Counter: denies History of alcohol / drug use?: Yes Longest period of sobriety (when/how long): denies SA being a major problem Negative Consequences of Use: Financial Withdrawal Symptoms:  (denies SA a problem)                    Current Place of Residence:   Place of Birth:   Family Members: Marital Status:  Widowed Children:  Sons:  Daughters: Relationships: Education:  HLevi StraussProblems/Performance: Religious Beliefs/Practices: History of Abuse (Emotional/Phsycial/Sexual) OShip brokerHistory:  None. Legal History: DUI in 2012 Hobbies/Interests:  Family History:  History reviewed. No pertinent family history.  Results for orders placed during the hospital encounter of 02/26/14 (from the past 72 hour(s))  URINE RAPID DRUG SCREEN (HOSP PERFORMED)     Status: Abnormal   Collection Time    02/26/14  5:28 AM      Result Value Ref Range   Opiates NONE DETECTED  NONE DETECTED   Cocaine NONE DETECTED  NONE DETECTED   Benzodiazepines POSITIVE (*) NONE DETECTED   Amphetamines NONE DETECTED  NONE DETECTED   Tetrahydrocannabinol NONE DETECTED  NONE DETECTED   Barbiturates NONE DETECTED  NONE  DETECTED   Comment:            DRUG SCREEN FOR MEDICAL PURPOSES     ONLY.  IF CONFIRMATION IS NEEDED     FOR ANY PURPOSE, NOTIFY LAB     WITHIN 5 DAYS.                LOWEST DETECTABLE LIMITS     FOR URINE DRUG SCREEN     Drug Class       Cutoff (ng/mL)     Amphetamine      1000     Barbiturate      200     Benzodiazepine   867     Tricyclics       544     Opiates          300     Cocaine          300     THC              50  ACETAMINOPHEN LEVEL     Status: None   Collection Time    02/26/14  6:04 AM      Result Value Ref Range   Acetaminophen (Tylenol), Serum <15.0  10 - 30 ug/mL   Comment:            THERAPEUTIC CONCENTRATIONS VARY      SIGNIFICANTLY. A RANGE OF 10-30     ug/mL MAY BE AN EFFECTIVE     CONCENTRATION FOR MANY PATIENTS.     HOWEVER, SOME ARE BEST TREATED     AT CONCENTRATIONS OUTSIDE THIS     RANGE.     ACETAMINOPHEN CONCENTRATIONS     >150 ug/mL AT 4 HOURS AFTER     INGESTION AND >50 ug/mL AT 12     HOURS AFTER INGESTION ARE     OFTEN ASSOCIATED WITH TOXIC     REACTIONS.  CBC     Status: None   Collection Time    02/26/14  6:04 AM      Result Value Ref Range   WBC 8.8  4.0 - 10.5 K/uL   RBC 5.03  4.22 - 5.81 MIL/uL   Hemoglobin 15.2  13.0 - 17.0 g/dL   HCT 44.2  39.0 - 52.0 %   MCV 87.9  78.0 - 100.0 fL   MCH 30.2  26.0 - 34.0 pg   MCHC 34.4  30.0 - 36.0 g/dL   RDW 13.0  11.5 - 15.5 %   Platelets 198  150 - 400 K/uL  COMPREHENSIVE METABOLIC PANEL     Status: Abnormal   Collection Time    02/26/14  6:04 AM      Result Value Ref Range   Sodium 140  137 - 147 mEq/L   Potassium 4.1  3.7 - 5.3 mEq/L   Chloride 103  96 - 112 mEq/L   CO2 26  19 - 32 mEq/L   Glucose, Bld 91  70 - 99 mg/dL   BUN 3 (*) 6 - 23 mg/dL   Creatinine, Ser 0.79  0.50 - 1.35 mg/dL   Calcium 9.2  8.4 - 10.5 mg/dL   Total Protein 7.0  6.0 - 8.3 g/dL   Albumin 3.8  3.5 - 5.2 g/dL   AST 27  0 - 37 U/L   ALT 44  0 - 53 U/L   Alkaline Phosphatase 74  39 - 117 U/L   Total Bilirubin 0.3  0.3 - 1.2 mg/dL  GFR calc non Af Amer >90  >90 mL/min   GFR calc Af Amer >90  >90 mL/min   Comment: (NOTE)     The eGFR has been calculated using the CKD EPI equation.     This calculation has not been validated in all clinical situations.     eGFR's persistently <90 mL/min signify possible Chronic Kidney     Disease.  ETHANOL     Status: None   Collection Time    02/26/14  6:04 AM      Result Value Ref Range   Alcohol, Ethyl (B) <11  0 - 11 mg/dL   Comment:            LOWEST DETECTABLE LIMIT FOR     SERUM ALCOHOL IS 11 mg/dL     FOR MEDICAL PURPOSES ONLY  SALICYLATE LEVEL     Status: Abnormal   Collection Time    02/26/14   6:04 AM      Result Value Ref Range   Salicylate Lvl <7.8 (*) 2.8 - 20.0 mg/dL   Psychological Evaluations:  Assessment:   DSM5:  AXIS I:  Schizoaffective Disorder  AXIS II:  Cluster B Traits AXIS III:   Past Medical History  Diagnosis Date  . Migraines   . Schizophrenia, schizo-affective   . Chronic back pain   . Knee pain, chronic    AXIS IV:  economic problems, occupational problems and other psychosocial or environmental problems AXIS V:  31-40 impairment in reality testing  Treatment Plan/Recommendations:   1. Admit for crisis management and stabilization. Estimated length of stay 5-7 days. 2. Medication management to reduce current symptoms to base line and improve the patient's level of functioning.  3. Develop treatment plan to decrease risk of relapse upon discharge of depressive symptoms and the need for readmission. 5. Group therapy to facilitate development of healthy coping skills to use for depression and anxiety. 6. Health care follow up as needed for medical problems.  7. Discharge plan to include therapy to help patient cope with death of mother and other stressors.  8. Call for Consult with Hospitalist for additional specialty patient services as needed.   Treatment Plan Summary: Daily contact with patient to assess and evaluate symptoms and progress in treatment Medication management Current Medications:  Current Facility-Administered Medications  Medication Dose Route Frequency Provider Last Rate Last Dose  . acetaminophen (TYLENOL) tablet 650 mg  650 mg Oral Q6H PRN Shuvon Rankin, NP   650 mg at 02/28/14 0327  . alum & mag hydroxide-simeth (MAALOX/MYLANTA) 200-200-20 MG/5ML suspension 30 mL  30 mL Oral Q6H PRN Lurena Nida, NP      . cyclobenzaprine (FLEXERIL) tablet 10 mg  10 mg Oral TID PRN Laverle Hobby, PA-C      . HYDROcodone-acetaminophen (NORCO) 10-325 MG per tablet 1-2 tablet  1-2 tablet Oral Q6H PRN Laverle Hobby, PA-C   1 tablet at 02/28/14  0558  . hydrOXYzine (ATARAX/VISTARIL) tablet 50 mg  50 mg Oral Q6H PRN Mojeed Akintayo      . magnesium hydroxide (MILK OF MAGNESIA) suspension 30 mL  30 mL Oral Daily PRN Shuvon Rankin, NP      . mirtazapine (REMERON) tablet 15 mg  15 mg Oral QHS Mojeed Akintayo      . nicotine (NICODERM CQ - dosed in mg/24 hours) patch 21 mg  21 mg Transdermal Daily Shuvon Rankin, NP      . OLANZapine zydis (ZYPREXA) disintegrating tablet 10 mg  10 mg Oral Q8H PRN Mojeed Akintayo      . ondansetron (ZOFRAN) tablet 4 mg  4 mg Oral Q8H PRN Shuvon Rankin, NP      . pantoprazole (PROTONIX) EC tablet 80 mg  80 mg Oral Daily Lurena Nida, NP   80 mg at 02/28/14 0752  . ziprasidone (GEODON) capsule 60 mg  60 mg Oral BID WC Mojeed Akintayo        Observation Level/Precautions:  15 minute checks  Laboratory:  CBC Chemistry Profile UDS  Psychotherapy:  Individual and Group Therapy   Medications:  Geodon 60 mg BID for improved mood stability, Remeron 15 mg hs of insomnia/depression, Zyprexa Zydis 10 mg every eight hours prn agitation  Consultations:  As needed   Discharge Concerns:  Safety and Stability   Estimated LOS: 5-7 days   Other:  No benzos for patient    I certify that inpatient services furnished can reasonably be expected to improve the patient's condition.   Elmarie Shiley NP-C 6/23/201512:03 PM   Patient seen, evaluated and I agree with notes by Nurse Practitioner. Corena Pilgrim, MD

## 2014-02-28 NOTE — [facility] Suicide Risk Assessment (Signed)
   Nursing information obtained from:    Demographic factors:    Current Mental Status:    Loss Factors:    Historical Factors:    Risk Reduction Factors:    Total Time spent with patient: 30 minutes  CLINICAL FACTORS:   Severe Anxiety and/or Agitation Bipolar Disorder: manic phase Alcohol/Substance Abuse/Dependencies Schizophrenia:   Paranoid or undifferentiated type Currently Psychotic  Psychiatric Specialty Exam: Physical Exam  Psychiatric: His affect is labile. His speech is rapid and/or pressured and tangential. He is hyperactive and actively hallucinating. Thought content is delusional. Cognition and memory are normal. He expresses impulsivity. He is inattentive.    Review of Systems  Constitutional: Negative.   HENT: Negative.   Eyes: Negative.   Respiratory: Negative.   Cardiovascular: Negative.   Gastrointestinal: Negative.   Genitourinary: Negative.   Musculoskeletal: Negative.   Skin: Negative.   Neurological: Negative.   Endo/Heme/Allergies: Negative.   Psychiatric/Behavioral: The patient is nervous/anxious and has insomnia.     Blood pressure 135/88, pulse 66, temperature 98.6 F (37 C), temperature source Oral, resp. rate 20, height 6' (1.829 m), weight 96.163 kg (212 lb), SpO2 95.00%.Body mass index is 28.75 kg/(m^2).  General Appearance: Casual  Eye Contact::  Good  Speech:  Pressured and Slurred  Volume:  Increased  Mood:  Euphoric  Affect:  Labile  Thought Process:  Circumstantial and Disorganized  Orientation:  Full (Time, Place, and Person)  Thought Content:  Delusions and Hallucinations: Auditory  Suicidal Thoughts:  No  Homicidal Thoughts:  No  Memory:  Immediate;   Fair Recent;   Fair Remote;   Fair  Judgement:  Impaired  Insight:  Lacking  Psychomotor Activity:  Increased  Concentration:  Poor  Recall:  [place]: Fair  Akathisia:  No  Handed:  Right  AIMS (if indicated):     Assets:  Communication  Skills Desire for Improvement Physical Health  Sleep:  Number of Hours: 0   Musculoskeletal: Strength & Muscle Tone: within normal limits Gait & Station: normal Patient leans: N/A  COGNITIVE FEATURES THAT CONTRIBUTE TO RISK:  Closed-mindedness Polarized thinking    SUICIDE RISK:   Minimal: No identifiable suicidal ideation.  Patients presenting with no risk factors but with morbid ruminations; may be classified as minimal risk based on the severity of the depressive symptoms  PLAN OF CARE:1. Admit for crisis management and stabilization. 2. Medication management to reduce current symptoms to base line and improve the  patient's overall level of functioning 3. Treat health problems as indicated. 4. Develop treatment plan to decrease risk of relapse upon discharge and the need for  readmission. 5. Psycho-social education regarding relapse prevention and self care. 6. Health care follow up as needed for medical problems. 7. Restart home medications where appropriate.   I certify that inpatient services furnished can reasonably be expected to improve the patient's condition.  Corena Pilgrim, MD 02/28/2014, 11:10 AM

## 2014-02-28 NOTE — Tx Team (Signed)
Interdisciplinary Treatment Plan Update (Adult)  Date: 02/28/2014  Time Reviewed:  9:45 AM  Progress in Treatment: Attending groups: Yes Participating in groups:  Yes Taking medication as prescribed:  Yes Tolerating medication:  Yes Family/Significant othe contact made: No, N/A due to no SI on admission  Patient understands diagnosis:  Yes Discussing patient identified problems/goals with staff:  Yes Medical problems stabilized or resolved:  Yes Denies suicidal/homicidal ideation: Yes Issues/concerns per patient self-inventory:  Yes Other:  New problem(s) identified: N/A  Discharge Plan or Barriers: CSW assessing for appropriate referrals.  Reason for Continuation of Hospitalization: Anxiety Depression Medication Stabilization Psychosis  Comments: N/A  Estimated length of stay: 3-5 days  For review of initial/current patient goals, please see plan of care.  Attendees: Patient:     Family:     Physician:  Dr. Corena Pilgrim 02/28/2014 9:35 AM   Nursing:   Mayra Neer, RN 02/28/2014 9:35 AM   Clinical Social Worker:  Regan Lemming, LCSW 02/28/2014 9:35 AM   Other: Elmarie Shiley, RN 02/28/2014 9:35 AM   Other:     Other:     Other:     Other:    Other:    Other:    Other:    Other:    Other:     Scribe for Treatment Team:   Ane Payment, 02/28/2014 9:35 AM

## 2014-03-01 ENCOUNTER — Inpatient Hospital Stay (HOSPITAL_COMMUNITY): Payer: 59

## 2014-03-01 DIAGNOSIS — F259 Schizoaffective disorder, unspecified: Principal | ICD-10-CM

## 2014-03-01 MED ORDER — HYDROMORPHONE HCL PF 2 MG/ML IJ SOLN
1.0000 mg | Freq: Once | INTRAMUSCULAR | Status: AC
  Administered 2014-03-01: 1 mg via INTRAMUSCULAR

## 2014-03-01 MED ORDER — HALOPERIDOL LACTATE 5 MG/ML IJ SOLN
5.0000 mg | Freq: Once | INTRAMUSCULAR | Status: AC
  Administered 2014-03-01: 5 mg via INTRAMUSCULAR
  Filled 2014-03-01 (×2): qty 1

## 2014-03-01 MED ORDER — ZIPRASIDONE HCL 80 MG PO CAPS
80.0000 mg | ORAL_CAPSULE | Freq: Two times a day (BID) | ORAL | Status: DC
  Administered 2014-03-01 – 2014-03-06 (×10): 80 mg via ORAL
  Filled 2014-03-01: qty 1
  Filled 2014-03-01 (×2): qty 6
  Filled 2014-03-01 (×3): qty 1
  Filled 2014-03-01: qty 6
  Filled 2014-03-01 (×7): qty 1
  Filled 2014-03-01: qty 6
  Filled 2014-03-01: qty 1

## 2014-03-01 MED ORDER — DIPHENHYDRAMINE HCL 25 MG PO CAPS
ORAL_CAPSULE | ORAL | Status: AC
  Filled 2014-03-01: qty 2

## 2014-03-01 MED ORDER — DIPHENHYDRAMINE HCL 50 MG PO CAPS
50.0000 mg | ORAL_CAPSULE | Freq: Once | ORAL | Status: AC
  Administered 2014-03-01: 50 mg via ORAL

## 2014-03-01 MED ORDER — FENTANYL CITRATE 0.05 MG/ML IJ SOLN: 50.0000 ug | Freq: Once | INTRAMUSCULAR | Status: DC

## 2014-03-01 MED ORDER — CARBAMAZEPINE ER 200 MG PO TB12
200.0000 mg | ORAL_TABLET | Freq: Two times a day (BID) | ORAL | Status: DC
  Administered 2014-03-01 – 2014-03-05 (×9): 200 mg via ORAL
  Filled 2014-03-01 (×13): qty 1

## 2014-03-01 MED ORDER — DIPHENHYDRAMINE HCL 50 MG/ML IJ SOLN
50.0000 mg | Freq: Once | INTRAMUSCULAR | Status: AC
  Administered 2014-03-01: 50 mg via INTRAMUSCULAR
  Filled 2014-03-01: qty 1

## 2014-03-01 MED ORDER — DOXEPIN HCL 25 MG PO CAPS
75.0000 mg | ORAL_CAPSULE | Freq: Every day | ORAL | Status: DC
  Administered 2014-03-01: 75 mg via ORAL
  Filled 2014-03-01 (×2): qty 3

## 2014-03-01 MED ORDER — HYDROMORPHONE HCL PF 2 MG/ML IJ SOLN
INTRAMUSCULAR | Status: AC
  Filled 2014-03-01: qty 1

## 2014-03-01 MED ORDER — DIPHENHYDRAMINE HCL 50 MG/ML IJ SOLN
INTRAMUSCULAR | Status: AC
  Filled 2014-03-01: qty 1

## 2014-03-01 NOTE — Progress Notes (Addendum)
D: Pt is anxious in affect and labile in mood. Pt reports having AVH. Pt's speech is pressured. Pt presents with a flight of ideas.Per staff, pt has been overheard making racial slurs in the hallway. Pt constantly questions " what medication do you have for me" to this Probation officer. Pt was informed of his scheduled Doxepin and its prescribed indications. Pt was also informed of his prn dose of Zyprexa and vistaril for anxiety. Pt reports that none of his medications are working. He denies any improvement in his AVH. This pt is often observed pacing the halls.   A: Writer administered scheduled and prn medications to pt. Continued support and availability as needed was extended to this pt. Staff continue to monitor pt with q27min checks.  R: No adverse drug reactions noted. Pt receptive to treatment. Pt remains safe at this time.

## 2014-03-01 NOTE — [facility] Group Notes (Signed)
[facility] LCSW Aftercare Discharge Planning Group Note   03/01/2014 8:45 AM  Participation Quality:  Alert, Appropriate and Oriented but Distracting  Mood/Affect:  Manic  Depression Rating:  none  Anxiety Rating:  "very anxious"  Thoughts of Suicide:  Pt denies SI/HI  Will you contract for safety?   Yes  Current AVH:  Pt endorses hearing voices but can't make out what they're saying unless everyone is quiet  Plan for Discharge/Comments:  Pt attended discharge planning group and actively participated in group.  CSW provided pt with today's workbook.  Pt was very distracting in group today.  Pt is observed mumbling and talking to himself, talking over others, answering other's questions and waving to people in the hall.  Pt reports feeling well today.  Pt states that he can return home in [place] and will follow up at [facility] for outpatient medication management and therapy.  .No further needs voiced by pt at this time.    Transportation Means: Pt reports access to transportation - mom will pick pt up  Supports: No supports mentioned at this time  Regan Lemming, LCSW 03/01/2014 10:10 AM

## 2014-03-01 NOTE — Discharge Summary (Signed)
Psychiatric supervisory review concurs with further decision-making regarding inpatient treatment needs for these findings and diagnoses.  Delight Hoh, MD

## 2014-03-01 NOTE — H&P (Signed)
Psychiatric supervisory review confirms these findings, diagnoses, and treatment plan as necessary and beneficial for patient and medically necessitated observation unit treatment, anticipating milieu and medication consistent support to be stabilizing and determining of direction of subsequent care to follow.  Delight Hoh, MD

## 2014-03-01 NOTE — ED Provider Notes (Signed)
CSN: [id number]     Arrival date & time 03/01/14  5009 History   First MD Initiated Contact with Patient 03/01/14 0509     No chief complaint on file.    (Consider location/radiation/quality/duration/timing/severity/associated sxs/prior Treatment) HPI 43 year old male [facility].  Patient was upset he did not get medication that he requested, and punched a door.  Patient was sent for x-ray.  Patient reports he has prior fractures to the hand. Past Medical History  Diagnosis Date  . Migraines   . Schizophrenia, schizo-affective   . Chronic back pain   . Knee pain, chronic    Past Surgical History  Procedure Laterality Date  . Knee surgery      four times   History reviewed. No pertinent family history. History  Substance Use Topics  . Smoking status: Former Smoker -- 3.00 packs/day for 10 years    Types: Cigarettes    Quit date: 02/26/2014  . Smokeless tobacco: Not on file  . Alcohol Use: Yes     Comment: occ    Review of Systems   See History of Present Illness; otherwise all other systems are reviewed and negative  Allergies  Nsaids and Quetiapine  Home Medications   Prior to Admission medications   Medication Sig Start Date End Date Taking? Authorizing Provider  acetaminophen (TYLENOL) 500 MG tablet Take 1,000 mg by mouth every 6 (six) hours as needed for mild pain.   Yes Historical Provider, MD  Aspirin-Acetaminophen (GOODYS BODY PAIN PO) Take 1 packet by mouth daily as needed. For pain   Yes Historical Provider, MD  cyclobenzaprine (FLEXERIL) 10 MG tablet Take 10 mg by mouth 3 (three) times daily as needed for muscle spasms.   Yes Historical Provider, MD  HYDROcodone-acetaminophen (NORCO) 10-325 MG per tablet Take 1 tablet by mouth 4 (four) times daily as needed for moderate pain. For pain   Yes Historical Provider, MD  omeprazole (PRILOSEC) 40 MG capsule Take 40 mg by mouth 2 (two) times daily.     Yes Historical Provider, MD   BP 135/88  Pulse 66  Temp(Src) 98.6 F (37 C) (Oral)  Resp 20  Ht 6' (1.829 m)  Wt 212 lb (96.163 kg)  BMI 28.75 kg/m2  SpO2 95% Physical Exam  Constitutional: He is oriented to person, place, and time. He appears well-developed and well-nourished. No distress.  Musculoskeletal: Normal range of motion. He exhibits no edema and no tenderness.  Patient has 1 cm shallow laceration over fourth MCP joint on right hand.  He has full range of motion of the MCP joint.  There is no deformity of the hand.  No acute swelling or bruising.  Laceration does not appear to go past the dermis.  No foreign body noted.  Neurological: He is alert and oriented to person, place, and time. He exhibits normal muscle tone. Coordination normal.  Skin: Skin is warm and dry. No rash noted. He is not diaphoretic. No erythema. No pallor.    ED Course  Wound closure utilizing adhes only Date/Time: 03/01/2014 5:28 AM Performed by: Kalman Drape Authorized by: Kalman Drape Local anesthesia used: no Patient sedated: no Patient tolerance: Patient tolerated the procedure well with no immediate complications. Comments: Wound closed with Steri-Strips and Dermabond.  Wound had previously been washed out by nursing staff.  No foreign object noted.  Wound approximated well.  No gap of the wound with bending of the knuckle.   (  including critical care time) Labs Review Labs Reviewed - No data to display  Imaging Review Dg Hand Complete Right  03/01/2014   CLINICAL DATA:  Punched a door with pain all over the right hand.  EXAM: RIGHT HAND - COMPLETE 3+ VIEW  COMPARISON:  None.  FINDINGS: Soft tissue swelling along lateral aspect of the right metacarpal region. No acute fracture or dislocation is identified. No focal bone lesion or bone destruction. Slight irregularities of the fourth and fifth metacarpal shafts may indicate old fracture deformities.  IMPRESSION: No acute bony abnormalities.   Soft tissue swelling.   Electronically Signed   By: Lucienne Capers M.D.   On: 03/01/2014 04:56     EKG Interpretation None      MDM   Final diagnoses:  Hand contusion, right, initial encounter  Hand laceration, right, initial encounter        Kalman Drape, MD 03/01/14 0530

## 2014-03-01 NOTE — BHH Group Notes (Signed)
[place] LCSW Group Therapy  03/01/2014   1:15 PM   Type of Therapy:  Group Therapy  Participation Level:  Active  Participation Quality:  Attentive, Sharing and Supportive  Affect:  Disorganized  Cognitive:  Alert and Oriented  Insight:  Developing/Improving and Engaged  Engagement in Therapy:  Developing/Improving and Engaged  Modes of Intervention:  Activity, Clarification, Confrontation, Discussion, Education, Exploration, Limit-setting, Orientation, Problem-solving, Rapport Building, Art therapist, Socialization and Support  Summary of Progress/Problems: Patient was attentive and engaged with speaker from [place].  Patient was attentive to speaker while they shared their story of dealing with mental health/schizophrenia and overcoming it.  Speaker also brought his guitar and played music interactively, sharing with patients his history of music and how it helps his mood.  Patient expressed interest in their programs and services and received information on their agency.  Patient processed ways they can relate to the speaker.   Pt came at the very end of group but actively listened to the speaker play his guitar and asked questions about music.    Regan Lemming, LCSW 03/01/2014  2:53 PM

## 2014-03-01 NOTE — Progress Notes (Signed)
It was brought to writer attention @ 1700 that pt was kicking door and agitated @ 1600. No injury communicated to writer from [facility] who medicated the pt at the time of the incident. no injuries noted by Probation officer. Pt safety maintained at this time.

## 2014-03-01 NOTE — Progress Notes (Signed)
Pt presents anxious today. Pt labile, easily agitated and threatening. Pt verbally aggressive towards staff and other pts on the unit. Pt paces the hallway cursing and talking about other pts. Pt stated in the hallway that his roommate is gay and he cannot be in a room with a gay guy. Pt has poor insight for treatment and continues to request for xanax. Per pt, his meds are not working for him. Pt stated to MHT, "what do I have to do in order to get some injections". Medications administered to pt as ordered per MD. Prn meds given for agitation as ordered. Verbal support given. Pt requires frequent redirecting by staff for inappropriate behviors. Pt safety maintained at this time.

## 2014-03-01 NOTE — ED Notes (Signed)
Pt comes from inpatient [facility], he was mad because he didn't get his xanax and punched a door, they sent him here for an xray of his right hand

## 2014-03-01 NOTE — Progress Notes (Signed)
Writer has observed patient up in the dayroom and pacing the hallway. He attended group this evening and participated but was redirected several time by MHT d/t interrupting other patients when they were talking. He was informed of his scheduled medication and first refused it and shortly came back requesting it. Patient requested xanax and was informed that it had been discontinued which he reports not being told this. Writer informed him of visteril available but he refused it. Patient has been very restless and intrusive with staff and patients but is redirectable. He currently denies si/hi and visual hallucinations. He appears to be experiencing auditory hallucinations. Safety maintained on unit with 15 min checks.

## 2014-03-01 NOTE — Discharge Instructions (Signed)
Contusion A contusion is a deep bruise. Contusions happen when an injury causes bleeding under the skin. Signs of bruising include pain, puffiness (swelling), and discolored skin. The contusion may turn blue, purple, or yellow. HOME CARE   Put ice on the injured area.  Put ice in a plastic bag.  Place a towel between your skin and the bag.  Leave the ice on for 15-20 minutes, 03-04 times a day.  Only take medicine as told by your doctor.  Rest the injured area.  If possible, raise (elevate) the injured area to lessen puffiness. GET HELP RIGHT AWAY IF:   You have more bruising or puffiness.  You have pain that is getting worse.  Your puffiness or pain is not helped by medicine. MAKE SURE YOU:   Understand these instructions.  Will watch your condition.  Will get help right away if you are not doing well or get worse. Document Released: 02/11/2008 Document Revised: 11/17/2011 Document Reviewed: 06/30/2011 [facility] Patient Information 2015 [place], [place]. This information is not intended to replace advice given to you by your health care provider. Make sure you discuss any questions you have with your health care provider.  Sterile Tape Wound Care Some cuts and wounds can be closed using sterile tape, also called skin adhesive strips. Skin adhesive strips can be used for shallow (superficial) and simple cuts, wounds, lacerations, and surgical incisions. These strips act in place of stitches to hold the edges of the wound together, allowing for faster healing. Unlike stitches, the adhesive strips do not require needles or anesthetic medicine for placement. The strips will wear off naturally as the wound is healing. It is important to take proper care of your wound at home while it heals.  HOME CARE INSTRUCTIONS  Try to keep the area around your wound clean and dry. Do not allow the adhesive strips to get wet for the first 12 hours.   Do not use any soaps or ointments on the  wound for the first 12 hours.   If a bandage (dressing) has been applied, follow your health care provider's instructions for how often to change the dressing. Keep the dressing dry if one has been applied.   Do not remove the adhesive strips. They will fall off on their own. If they do not, you may remove them gently after 10 days. You should gently wet the strips before removing them. For example, this can be done in the shower.  Do not scratch, pick, or rub the wound area.   Protect the wound from further injury until it is healed.   Protect the wound from sun and tanning bed exposure while it is healing and for several weeks after healing.   Only take over-the-counter or prescription medicines as directed by your health care provider.   Keep all follow-up appointments as directed by your health care provider.  SEEK MEDICAL CARE IF: Your adhesive strips become wet or soaked with blood before the wound has healed. The tape will need to be replaced.  SEEK IMMEDIATE MEDICAL CARE IF:  You have increasing pain in the wound.   You develop a rash after the strips are applied.  Your wound becomes red, swollen, hot, or tender.   You have a red streak that goes away from the wound.   You have pus coming from the wound.   You have increased bleeding from the wound.  You notice a bad smell coming from the wound.   Your wound breaks open.  MAKE SURE YOU:  Understand these instructions.  Will watch your condition.  Will get help right away if you are not doing well or get worse. Document Released: 10/02/2004 Document Revised: 06/15/2013 Document Reviewed: 03/16/2013 [facility] Patient Information 2015 [place], [place]. This information is not intended to replace advice given to you by your health care provider. Make sure you discuss any questions you have with your health care provider.

## 2014-03-01 NOTE — Progress Notes (Signed)
[facility] MD Progress Note  03/01/2014 10:22 AM [name]  MRN:  [id number] Subjective:   Patient states "Yes I punched the glass. I wanted to punch out the window. I can't get any peace here. You all are supposed to medicate me. I want my xanax back. Nothing helps my anxiety or can make me sleep. I have taken everything. I was just hearing voices before you came in here."  Objective:  Patient is visible on the unit. The patient is experiencing ongoing symptoms of psychosis and agitation. [name] became aggressive last night due to complaints of severe anxiety. He was sent to the ED for evaluation after repeatedly punching the glass window with his right hand. The x-ray was negative. Patient admitted to being angry that his xanax had been discontinued. He is assessed in his room today with members of the treatment team. Patient continues to express anger about his medications. He was informed that his medications would be adjusted to address his symptoms. It was again reiterated that xanax was not the preferred treatment for his mental health diagnosis. The patient did not appear to be in any acute distress. He was eating yogurt using his right hand and did not appear to be in any pain.   Diagnosis:   DSM5: Total Time spent with patient: 30 minutes AXIS I: Schizoaffective Disorder  AXIS II: Cluster B Traits  AXIS III:  Past Medical History   Diagnosis  Date   .  Migraines    .  Schizophrenia, schizo-affective    .  Chronic back pain    .  Knee pain, chronic     AXIS IV: economic problems, occupational problems and other psychosocial or environmental problems  AXIS V: 31-40 impairment in reality testing   ADL's:  Intact  Sleep: Poor  Appetite:  Fair  Suicidal Ideation:  Denies Homicidal Ideation:  Denies AEB (as evidenced by):  Psychiatric Specialty Exam: Physical Exam  Review of Systems  Constitutional: Negative.   HENT: Negative.   Eyes: Negative.   Respiratory: Negative.    Cardiovascular: Negative.   Gastrointestinal: Negative.   Genitourinary: Negative.   Musculoskeletal: Positive for joint pain.  Skin: Negative.   Neurological: Negative.   Endo/Heme/Allergies: Negative.   Psychiatric/Behavioral: Positive for depression, hallucinations and substance abuse. Negative for suicidal ideas and memory loss. The patient is nervous/anxious and has insomnia.     Blood pressure 154/90, pulse 84, temperature 100 F (37.8 C), temperature source Oral, resp. rate 20, height 6' (1.829 m), weight 96.163 kg (212 lb), SpO2 95.00%.Body mass index is 28.75 kg/(m^2).  General Appearance: Casual  Eye Contact::  Good  Speech:  Pressured  Volume:  Increased  Mood:  Angry and Irritable  Affect:  Labile  Thought Process:  Circumstantial  Orientation:  Full (Time, Place, and Person)  Thought Content:  Delusions and Hallucinations: Auditory  Suicidal Thoughts:  No  Homicidal Thoughts:  No  Memory:  Immediate;   Good Recent;   Fair Remote;   Fair  Judgement:  Impaired  Insight:  Lacking  Psychomotor Activity:  Increased and Restlessness  Concentration:  Fair  Recall:  [employer] of Knowledge:Fair  Language: Fair  Akathisia:  No  Handed:  Right  AIMS (if indicated):     Assets:  Communication Skills Desire for Improvement Physical Health  Sleep:  Number of Hours: 0   Musculoskeletal: Strength & Muscle Tone: within normal limits Gait & Station: normal Patient leans: N/A  Current Medications: Current Facility-Administered Medications  Medication Dose Route Frequency Provider Last Rate Last Dose  . acetaminophen (TYLENOL) tablet 650 mg  650 mg Oral Q6H PRN Shuvon Rankin, NP   650 mg at 02/28/14 2150  . alum & mag hydroxide-simeth (MAALOX/MYLANTA) 200-200-20 MG/5ML suspension 30 mL  30 mL Oral Q6H PRN Lurena Nida, NP   30 mL at 02/28/14 1310  . carbamazepine (TEGRETOL XR) 12 hr tablet 200 mg  200 mg Oral BID Shreshta Medley      . cyclobenzaprine (FLEXERIL) tablet  10 mg  10 mg Oral TID PRN Laverle Hobby, PA-C      . HYDROcodone-acetaminophen (NORCO) 10-325 MG per tablet 1-2 tablet  1-2 tablet Oral Q6H PRN Laverle Hobby, PA-C   1 tablet at 02/28/14 0558  . hydrOXYzine (ATARAX/VISTARIL) tablet 50 mg  50 mg Oral Q6H PRN Yalissa Fink   50 mg at 03/01/14 0746  . magnesium hydroxide (MILK OF MAGNESIA) suspension 30 mL  30 mL Oral Daily PRN Shuvon Rankin, NP      . mirtazapine (REMERON) tablet 15 mg  15 mg Oral QHS Emylie Amster   15 mg at 02/28/14 2119  . nicotine (NICODERM CQ - dosed in mg/24 hours) patch 21 mg  21 mg Transdermal Daily Shuvon Rankin, NP      . OLANZapine zydis (ZYPREXA) disintegrating tablet 10 mg  10 mg Oral Q8H PRN Kavir Savoca   10 mg at 03/01/14 0058  . ondansetron (ZOFRAN) tablet 4 mg  4 mg Oral Q8H PRN Shuvon Rankin, NP      . pantoprazole (PROTONIX) EC tablet 80 mg  80 mg Oral Daily Lurena Nida, NP   80 mg at 03/01/14 0746  . ziprasidone (GEODON) capsule 60 mg  60 mg Oral BID WC Farris Blash   60 mg at 03/01/14 0746    Lab Results: No results found for this or any previous visit (from the past 48 hour(s)).  Physical Findings: AIMS: Facial and Oral Movements Muscles of Facial Expression: None, normal Lips and Perioral Area: None, normal Jaw: None, normal Tongue: None, normal,Extremity Movements Upper (arms, wrists, hands, fingers): None, normal Lower (legs, knees, ankles, toes): None, normal, Trunk Movements Neck, shoulders, hips: None, normal, Overall Severity Severity of abnormal movements (highest score from questions above): None, normal Incapacitation due to abnormal movements: None, normal Patient's awareness of abnormal movements (rate only patient's report): No Awareness, Dental Status Current problems with teeth and/or dentures?: No Does patient usually wear dentures?: No  CIWA:    COWS:     Treatment Plan Summary: Daily contact with patient to assess and evaluate symptoms and progress in  treatment Medication management  Plan:  1. Continue crisis management and stabilization.  2. Medication management:  -Increase Geodon to 80 mg BID for psychosis -Discontinue Remeron 15 mg hs due to ineffectiveness  -Start Doxepin 75 mg at hs for depression/insomnia -Initiate Tegretol XR 200 mg BID for improved mood stability  -Continue Vistaril 50 mg every six hours prn anxiety.  -Continue Zyprexa Zydis 10 mg every eight hours prn anxiety or agitation  3. Encouraged patient to attend groups and participate in group counseling sessions and activities.  4. Discharge plan in progress.  5. Continue current treatment plan.  6. Address health issues: X-ray of right hand negative for fracture.   Medical Decision Making Problem Points:  Established problem, worsening (2), Review of last therapy session (1) and Review of psycho-social stressors (1) Data Points:  Review or order clinical lab tests (1) Review  or order medicine tests (1) Review of medication regiment & side effects (2) Review of new medications or change in dosage (2)  I certify that inpatient services furnished can reasonably be expected to improve the patient's condition.   Elmarie Shiley NP-C 03/01/2014, 10:22 AM  Patient seen, evaluated and I agree with notes by Nurse Practitioner. Corena Pilgrim, MD

## 2014-03-01 NOTE — Progress Notes (Signed)
S: Patient is a right hand dominant WM, agitated and roaming the hall, repeatedly punched the glass window with his right hand. denies pain at this time, endorsing prior Fx involving same hand.  O: M/S/ Neuro: noted STS and abrasions involving the 2nd and 3rd carpel and proximal metacarpals. No obvious neuro-vascular compromise noted. Full ROM with flexion and extension of noted appendages of right hand  A/P: * Suspected Fx of 2nd and or 3rd carpel/metacarpel right hand * R.I.C.E * Dilaudid 1 mg IM x one * Haldol 5 mg IM x one * Benadryl 50 MG IM x one * Transport to ED for further eval and Tx .I agree with assessment and plan Geralyn Flash A. Sabra Heck, M.D.

## 2014-03-02 DIAGNOSIS — F250 Schizoaffective disorder, bipolar type: Secondary | ICD-10-CM | POA: Diagnosis present

## 2014-03-02 DIAGNOSIS — F411 Generalized anxiety disorder: Secondary | ICD-10-CM | POA: Diagnosis present

## 2014-03-02 DIAGNOSIS — F1320 Sedative, hypnotic or anxiolytic dependence, uncomplicated: Secondary | ICD-10-CM | POA: Diagnosis present

## 2014-03-02 MED ORDER — HYDROXYZINE HCL 25 MG PO TABS
25.0000 mg | ORAL_TABLET | Freq: Three times a day (TID) | ORAL | Status: DC
  Administered 2014-03-02 – 2014-03-06 (×12): 25 mg via ORAL
  Filled 2014-03-02 (×2): qty 1
  Filled 2014-03-02: qty 9
  Filled 2014-03-02 (×4): qty 1
  Filled 2014-03-02 (×2): qty 9
  Filled 2014-03-02 (×4): qty 1
  Filled 2014-03-02 (×2): qty 9
  Filled 2014-03-02 (×4): qty 1
  Filled 2014-03-02: qty 9
  Filled 2014-03-02: qty 1

## 2014-03-02 MED ORDER — DOXEPIN HCL 100 MG PO CAPS
100.0000 mg | ORAL_CAPSULE | Freq: Every day | ORAL | Status: DC
  Administered 2014-03-02: 100 mg via ORAL
  Filled 2014-03-02 (×2): qty 1

## 2014-03-02 NOTE — BHH Group Notes (Signed)
[place] LCSW Group Therapy  03/02/2014  1:15 PM   Type of Therapy:  Group Therapy  Participation Level:  Minimal  Participation Quality:  Minimal  Affect:  Depressed and Flat  Cognitive:  Drowsy  Insight:  Developing/Improving and Engaged  Engagement in Therapy:  Developing/Improving and Engaged  Modes of Intervention:  Clarification, Confrontation, Discussion, Education, Exploration, Limit-setting, Orientation, Problem-solving, Rapport Building, Art therapist, Socialization and Support  Summary of Progress/Problems: The topic for group was balance in life.  Today's group focused on defining balance in one's own words, identifying things that can knock one off balance, and exploring healthy ways to maintain balance in life. Group members were asked to provide an example of a time when they felt off balance, describe how they handled that situation,and process healthier ways to regain balance in the future. Group members were asked to share the most important tool for maintaining balance that they learned while at [facility] and how they plan to apply this method after discharge.  Pt was observed sleeping through group.  Pt did share that balance to him means not having too much medication but not too little and getting enough sleep.  Despite pt's lack of participation today, pt appears more stable AEB ability to sit through group quietly and not disrupt others.    Regan Lemming, LCSW 03/02/2014  2:06 PM

## 2014-03-02 NOTE — Progress Notes (Signed)
D:  Patient's self inventory sheet, patient needs sleep medication, good appetite, normal energy level, good attention span.  Denied depression and hopeless.  Continues to experience tremors, sedation, diarrhea, cravings, agitation.  Denied SI and HI, contracts for safety.  Stated he has heard voices but is not listening to what the voices are saying to him.  Denied visual hallucinations. A:  Medications administered per MD orders.  Emotional support and encouragement given patient. R:  Denied SI and HI, contracts for safety.  Voices continue which he tries to ignore.  Denied visual hallucinations.   Patient told nurse he is going to [facility] and going to take someone with him.  Will continue to monitor patient for safety with 15 minute checks.  Safety maintained.

## 2014-03-02 NOTE — Progress Notes (Signed)
Patient ID: [name], male   DOB: 1971/05/15, 43 y.o.   MRN: [id number] [facility] MD Progress Note  03/02/2014 10:34 AM [name] Marcil  MRN:  [id number] Subjective:   Patient states "I am anxious, irritable and has difficulty sleeping. You need to put me back on my Xanax, it cures everything for me. I am not going to overdose on it. I have overdosed more than 20 x but not with my Xanax.''  Objective:  Patient is seen, his chart reviewed with the treatment team member. Patient continues to exhibit aggressive, impulsive behavior and acting out due to his demands for Xanax being denied. He is focused on getting Xanax and has been asking for it at every opportunity. Patient also report difficulty sleeping, mood swings, racing thoughts and hearing voices of people telling him nothing in particular. He is intrusive, demanding, defiant and oppositional. He needs recurrent redirection in group, he is fond of making sarcastic statements to his peers and staffs. He is compliant with his current medication regimen and has not reported any adverse reactions. Diagnosis:   DSM5: Total Time spent with patient: 20 minutes AXIS I: Schizoaffective Disorder               Anxiety disorder, NOS              Benzodiazepine dependence AXIS II: Cluster B Traits  AXIS III:  Past Medical History   Diagnosis  Date   .  Migraines    .  Chronic back pain    .  Knee pain, chronic     AXIS IV: economic problems, occupational problems and other psychosocial or environmental problems  AXIS V: 31-40 impairment in reality testing   ADL's:  Intact  Sleep: Poor  Appetite:  Fair  Suicidal Ideation:  Denies Homicidal Ideation:  Denies AEB (as evidenced by):  Psychiatric Specialty Exam: Physical Exam  Review of Systems  Constitutional: Negative.   HENT: Negative.   Eyes: Negative.   Respiratory: Negative.   Cardiovascular: Negative.   Gastrointestinal: Negative.   Genitourinary: Negative.    Musculoskeletal: Positive for joint pain.  Skin: Negative.   Neurological: Negative.   Endo/Heme/Allergies: Negative.   Psychiatric/Behavioral: Positive for depression, hallucinations and substance abuse. Negative for suicidal ideas and memory loss. The patient is nervous/anxious and has insomnia.     Blood pressure 134/81, pulse 71, temperature 97.8 F (36.6 C), temperature source Oral, resp. rate 20, height 6' (1.829 m), weight 96.163 kg (212 lb), SpO2 95.00%.Body mass index is 28.75 kg/(m^2).  General Appearance: Casual  Eye Contact::  Good  Speech:  Pressured  Volume:  Increased  Mood:  Angry and Irritable  Affect:  Labile  Thought Process:  Circumstantial  Orientation:  Full (Time, Place, and Person)  Thought Content:  Delusions and Hallucinations: Auditory  Suicidal Thoughts:  No  Homicidal Thoughts:  No  Memory:  Immediate;   Good Recent;   Fair Remote;   Fair  Judgement:  Impaired  Insight:  Lacking  Psychomotor Activity:  Increased and Restlessness  Concentration:  Fair  Recall:  [employer] of Knowledge:Fair  Language: Fair  Akathisia:  No  Handed:  Right  AIMS (if indicated):     Assets:  Communication Skills Desire for Improvement Physical Health  Sleep:  Number of Hours: 1.5   Musculoskeletal: Strength & Muscle Tone: within normal limits Gait & Station: normal Patient leans: N/A  Current Medications: Current Facility-Administered Medications  Medication Dose Route Frequency Provider Last Rate  Last Dose  . acetaminophen (TYLENOL) tablet 650 mg  650 mg Oral Q6H PRN Shuvon Rankin, NP   650 mg at 03/02/14 0447  . alum & mag hydroxide-simeth (MAALOX/MYLANTA) 200-200-20 MG/5ML suspension 30 mL  30 mL Oral Q6H PRN Lurena Nida, NP   30 mL at 02/28/14 1310  . carbamazepine (TEGRETOL XR) 12 hr tablet 200 mg  200 mg Oral BID Mojeed Akintayo   200 mg at 03/02/14 0746  . cyclobenzaprine (FLEXERIL) tablet 10 mg  10 mg Oral TID PRN Laverle Hobby, PA-C      .  doxepin (SINEQUAN) capsule 100 mg  100 mg Oral QHS Mojeed Akintayo      . hydrOXYzine (ATARAX/VISTARIL) tablet 25 mg  25 mg Oral TID Mojeed Akintayo      . magnesium hydroxide (MILK OF MAGNESIA) suspension 30 mL  30 mL Oral Daily PRN Shuvon Rankin, NP   30 mL at 03/02/14 1012  . nicotine (NICODERM CQ - dosed in mg/24 hours) patch 21 mg  21 mg Transdermal Daily Shuvon Rankin, NP      . OLANZapine zydis (ZYPREXA) disintegrating tablet 10 mg  10 mg Oral Q8H PRN Mojeed Akintayo   10 mg at 03/02/14 0936  . ondansetron (ZOFRAN) tablet 4 mg  4 mg Oral Q8H PRN Shuvon Rankin, NP      . pantoprazole (PROTONIX) EC tablet 80 mg  80 mg Oral Daily Lurena Nida, NP   80 mg at 03/02/14 0746  . ziprasidone (GEODON) capsule 80 mg  80 mg Oral BID WC Mojeed Akintayo   80 mg at 03/02/14 0747    Lab Results: No results found for this or any previous visit (from the past 48 hour(s)).  Physical Findings: AIMS: Facial and Oral Movements Muscles of Facial Expression: None, normal Lips and Perioral Area: None, normal Jaw: None, normal Tongue: None, normal,Extremity Movements Upper (arms, wrists, hands, fingers): None, normal Lower (legs, knees, ankles, toes): None, normal, Trunk Movements Neck, shoulders, hips: None, normal, Overall Severity Severity of abnormal movements (highest score from questions above): None, normal Incapacitation due to abnormal movements: None, normal Patient's awareness of abnormal movements (rate only patient's report): No Awareness, Dental Status Current problems with teeth and/or dentures?: No Does patient usually wear dentures?: No  CIWA:    COWS:     Treatment Plan Summary: Daily contact with patient to assess and evaluate symptoms and progress in treatment Medication management  Plan:  1. Continue crisis management and stabilization.  2. Medication management:  -Continue Geodon to 80 mg BID for psychosis -Increase Doxepin 100 mg at hs for  anxiety/depression/insomnia -Continue Tegretol XR 200 mg BID for improved mood stability  -Change  Vistaril to 25 mg  TID for anxiety.  -Continue Zyprexa Zydis 10 mg every eight hours prn anxiety or agitation  3. Encouraged patient to attend groups and participate in group counseling sessions and activities.  4. Discharge plan in progress.  5. Continue current treatment plan.  6. Address health issues: X-ray of right hand negative for fracture.   Medical Decision Making Problem Points:  Established problem, worsening (2), Review of last therapy session (1) and Review of psycho-social stressors (1) Data Points:  Review or order clinical lab tests (1) Review or order medicine tests (1) Review of medication regiment & side effects (2) Review of new medications or change in dosage (2)  I certify that inpatient services furnished can reasonably be expected to improve the patient's condition.   Corena Pilgrim, MD  03/02/2014, 10:34 AM

## 2014-03-02 NOTE — Progress Notes (Signed)
Pt is interested in having anger management intervention.

## 2014-03-02 NOTE — Progress Notes (Signed)
Nutrition Brief Note  Patient identified on the Malnutrition Screening Tool (MST) Report.  Wt Readings from Last 3 Encounters:  02/26/14 212 lb (96.163 kg)  10/11/13 209 lb 10.5 oz (95.1 kg)  06/22/13 218 lb (98.884 kg)    Body mass index is 28.75 kg/(m^2). Patient meets criteria for overweight based on current BMI.   Admitted with hearing voices, hx of suicide attempts. Discussed intake PTA with patient and compared to intake presently. Pt reports he had a good appetite and was eating well at home and has been eating well during admission. Denies any changes in weight.   Current diet order is regular and pt is also offered choice of unit snacks mid-morning and mid-afternoon.  Pt is eating as desired.   Labs and medications reviewed. No nutrition diagnosis identified at this time.  No additional nutrition interventions warranted at this time. If nutrition issues arise, please consult RD.   Carlis Stable MS, [place], LDN [phone] Pager [phone] Weekend/After Hours Pager

## 2014-03-02 NOTE — Progress Notes (Signed)
Pt attended karaoke this evening.  

## 2014-03-03 MED ORDER — DOXEPIN HCL 50 MG PO CAPS
150.0000 mg | ORAL_CAPSULE | Freq: Every day | ORAL | Status: DC
  Administered 2014-03-03 – 2014-03-05 (×3): 150 mg via ORAL
  Filled 2014-03-03 (×3): qty 2
  Filled 2014-03-03: qty 9
  Filled 2014-03-03: qty 2
  Filled 2014-03-03: qty 9

## 2014-03-03 MED ORDER — HYDROXYZINE HCL 25 MG PO TABS
25.0000 mg | ORAL_TABLET | Freq: Once | ORAL | Status: AC
  Administered 2014-03-04: 25 mg via ORAL
  Filled 2014-03-03: qty 1

## 2014-03-03 NOTE — BHH Group Notes (Signed)
[facility] LCSW Aftercare Discharge Planning Group Note   03/03/2014 8:45 AM  Participation Quality:  Alert, Appropriate and Oriented   Mood/Affect:  Calm  Depression Rating:  none  Anxiety Rating:  "very anxious" but reports it's normal  Thoughts of Suicide:  Pt denies SI/HI  Will you contract for safety?   Yes  Current AVH:  Pt endorses hearing voices but doesn't know what they're saying and doesn't listen to them  Plan for Discharge/Comments:  Pt attended discharge planning group and actively participated in group.  CSW provided pt with today's workbook.  Pt was quiet and calm in group today.  Pt states that he can return home in [place] and will follow up at [facility] for outpatient medication management and therapy.  .No further needs voiced by pt at this time.    Transportation Means: Pt reports access to transportation - mom will pick pt up  Supports: No supports mentioned at this time  Regan Lemming, LCSW 03/03/2014 9:36 AM

## 2014-03-03 NOTE — BHH Group Notes (Signed)
[facility] LCSW Group Therapy  03/03/2014  1:15 PM   Type of Therapy:  Group Therapy  Participation Level:  Active  Participation Quality:  Attentive, Sharing and Supportive  Affect:  Depressed and Flat  Cognitive:  Alert and Oriented  Insight:  Developing/Improving and Engaged  Engagement in Therapy:  Developing/Improving and Engaged  Modes of Intervention:  Clarification, Confrontation, Discussion, Education, Exploration, Limit-setting, Orientation, Problem-solving, Rapport Building, Art therapist, Socialization and Support  Summary of Progress/Problems: The topic for today was feelings about relapse.  Pt discussed what relapse prevention is to them and identified triggers that they are on the path to relapse.  Pt processed their feeling towards relapse and was able to relate to peers.  Pt discussed coping skills that can be used for relapse prevention.  Pt was able to define relapse and what it would mean for him.  Pt states that relapses "sucks" because he was able to do well for awhile and than relapsing.  Pt attempts to follow the group discussion and is doing a better job at staying on topic but struggles with side conversation and intrusive questions to peers.  Pt actively participated and was engaged in group discussion.    Regan Lemming, LCSW 03/03/2014  2:08 PM

## 2014-03-03 NOTE — BHH Group Notes (Signed)
Adult Psychoeducational Group Note  Date:  03/03/2014 Time:  9:43 PM  Group Topic/Focus:  Wrap-Up Group:   The focus of this group is to help patients review their daily goal of treatment and discuss progress on daily workbooks.  Participation Level:  Did Not Attend  Additional Comments: pt did not attend wrap up group this evening for he was sleeping.   Flossie Dibble 03/03/2014, 9:43 PM

## 2014-03-03 NOTE — Progress Notes (Addendum)
(  Discharging Saturday) [facility] Adult Case Management Discharge Plan :  Will you be returning to the same living situation after discharge: Yes,  returning home in [place], mom is supportive At discharge, do you have transportation home?:Yes,  mom will pick pt up tomorrow Do you have the ability to pay for your medications:Yes,  provided pt with prescriptions and pt verbalizes ability to afford meds.   Release of information consent forms completed and in the chart;  Patient's signature needed at discharge.  Patient to Follow up at: Follow-up Information   Follow up with [facility] On 03/07/2014. (Walk in for hospital discharge appointment, Monday - Friday 8 am - 3 pm.  They will than schedule you for medication management and therapy.  )    Contact information:   [address], [place] [address] Phone: [phone] Fax: [phone]      Patient denies SI/HI:   Yes,  denies SI/HI    Safety Planning and Suicide Prevention discussed:  Yes,  discussed with pt.  N/A to contact family/friend due to no SI on admission.  See suicide prevention education note.   Ane Payment 03/03/2014, 10:36 AM

## 2014-03-03 NOTE — Progress Notes (Signed)
Patient ID: [name], male   DOB: 01-29-1971, 43 y.o.   MRN: [id number] [facility] MD Progress Note  03/03/2014 11:19 AM [name]  MRN:  [id number] Subjective:   Patient states "I am still having trouble sleeping but overall I am feeling mush better.''  Objective:  Patient is seen, his chart reviewed with the treatment team member. Patient is endorsing decreased mood swings, aggressive behavior and impulsivity. He thinks his medications are finally working for him but still has trouble staying asleep due to excessive worries about his family.  Patient reports decreased paranoia and psychosis. He denies suicidal/homicidal ideation, intent or plan. However, he remains intrusive, demanding and could be oppositional. He needs recurrent redirection in group and he is fond of making sarcastic statements to his peers and staffs. He is compliant with his current medication regimen and has not reported any adverse reactions. Diagnosis:   DSM5: Total Time spent with patient: 20 minutes AXIS I: Schizoaffective Disorder               Anxiety disorder, NOS              Benzodiazepine dependence AXIS II: Cluster B Traits  AXIS III:  Past Medical History   Diagnosis  Date   .  Migraines    .  Chronic back pain    .  Knee pain, chronic     AXIS IV: economic problems, occupational problems and other psychosocial or environmental problems  AXIS V: 50-60 moderate symptoms   ADL's:  Intact  Sleep: fair  Appetite:  Fair  Suicidal Ideation:  Denies Homicidal Ideation:  Denies AEB (as evidenced by):  Psychiatric Specialty Exam: Physical Exam  Review of Systems  Constitutional: Negative.   HENT: Negative.   Eyes: Negative.   Respiratory: Negative.   Cardiovascular: Negative.   Gastrointestinal: Negative.   Genitourinary: Negative.   Musculoskeletal: Positive for joint pain.  Skin: Negative.   Neurological: Negative.   Endo/Heme/Allergies: Negative.   Psychiatric/Behavioral:  Positive for hallucinations and substance abuse. Negative for suicidal ideas and memory loss. The patient has insomnia.     Blood pressure 127/86, pulse 88, temperature 97.1 F (36.2 C), temperature source Oral, resp. rate 18, height 6' (1.829 m), weight 96.163 kg (212 lb), SpO2 95.00%.Body mass index is 28.75 kg/(m^2).  General Appearance: Casual  Eye Contact::  Good  Speech:  Pressured  Volume:  Increased  Mood:  Angry and Irritable  Affect:  Labile  Thought Process:  Circumstantial  Orientation:  Full (Time, Place, and Person)  Thought Content:  Delusions  Suicidal Thoughts:  No  Homicidal Thoughts:  No  Memory:  Immediate;   Good Recent;   Fair Remote;   Fair  Judgement:  Impaired  Insight:  Lacking  Psychomotor Activity:  Increased and Restlessness  Concentration:  Fair  Recall:  [employer] of Knowledge:Fair  Language: Fair  Akathisia:  No  Handed:  Right  AIMS (if indicated):     Assets:  Communication Skills Desire for Improvement Physical Health  Sleep:  Number of Hours: 1.25   Musculoskeletal: Strength & Muscle Tone: within normal limits Gait & Station: normal Patient leans: N/A  Current Medications: Current Facility-Administered Medications  Medication Dose Route Frequency Clayborn Milnes Last Rate Last Dose  . acetaminophen (TYLENOL) tablet 650 mg  650 mg Oral Q6H PRN Shuvon Rankin, NP   650 mg at 03/02/14 2006  . alum & mag hydroxide-simeth (MAALOX/MYLANTA) 200-200-20 MG/5ML suspension 30 mL  30 mL Oral  Q6H PRN Kristeen Mans, NP   30 mL at 02/28/14 1310  . carbamazepine (TEGRETOL XR) 12 hr tablet 200 mg  200 mg Oral BID Mojeed Akintayo   200 mg at 03/03/14 0743  . cyclobenzaprine (FLEXERIL) tablet 10 mg  10 mg Oral TID PRN Kerry Hough, PA-C      . doxepin (SINEQUAN) capsule 150 mg  150 mg Oral QHS Mojeed Akintayo      . hydrOXYzine (ATARAX/VISTARIL) tablet 25 mg  25 mg Oral TID Mojeed Akintayo   25 mg at 03/03/14 0743  . hydrOXYzine (ATARAX/VISTARIL) tablet 25  mg  25 mg Oral Once Kristeen Mans, NP      . magnesium hydroxide (MILK OF MAGNESIA) suspension 30 mL  30 mL Oral Daily PRN Shuvon Rankin, NP   30 mL at 03/02/14 1012  . nicotine (NICODERM CQ - dosed in mg/24 hours) patch 21 mg  21 mg Transdermal Daily Shuvon Rankin, NP      . OLANZapine zydis (ZYPREXA) disintegrating tablet 10 mg  10 mg Oral Q8H PRN Mojeed Akintayo   10 mg at 03/02/14 2151  . ondansetron (ZOFRAN) tablet 4 mg  4 mg Oral Q8H PRN Shuvon Rankin, NP      . pantoprazole (PROTONIX) EC tablet 80 mg  80 mg Oral Daily Kristeen Mans, NP   80 mg at 03/03/14 0743  . ziprasidone (GEODON) capsule 80 mg  80 mg Oral BID WC Mojeed Akintayo   80 mg at 03/03/14 4098    Lab Results: No results found for this or any previous visit (from the past 48 hour(s)).  Physical Findings: AIMS: Facial and Oral Movements Muscles of Facial Expression: None, normal Lips and Perioral Area: None, normal Jaw: None, normal Tongue: None, normal,Extremity Movements Upper (arms, wrists, hands, fingers): None, normal Lower (legs, knees, ankles, toes): None, normal, Trunk Movements Neck, shoulders, hips: None, normal, Overall Severity Severity of abnormal movements (highest score from questions above): None, normal Incapacitation due to abnormal movements: None, normal Patient's awareness of abnormal movements (rate only patient's report): No Awareness, Dental Status Current problems with teeth and/or dentures?: No Does patient usually wear dentures?: No  CIWA:  CIWA-Ar Total: 2 COWS:  COWS Total Score: 1  Treatment Plan Summary: Daily contact with patient to assess and evaluate symptoms and progress in treatment Medication management  Plan:  1. Continue crisis management and stabilization.  2. Medication management:  -Continue Geodon to 80 mg BID for psychosis -Increase Doxepin 150 mg at hs for anxiety/depression/insomnia -Continue Tegretol XR 200 mg BID for improved mood stability  -Change  Vistaril to  25 mg  TID for anxiety.  -Continue Zyprexa Zydis 10 mg every eight hours prn anxiety or agitation  3. Encouraged patient to attend groups and participate in group counseling sessions and activities.  4. Discharge plan in progress.  5. Continue current treatment plan.  6. Address health issues: X-ray of right hand negative for fracture.  7. Tegretol level on 03/04/14 8. Possible discharge on 03/04/14  Medical Decision Making Problem Points:  Established problem, improving (1), Review of last therapy session (1) and Review of psycho-social stressors (1) Data Points:  Review or order clinical lab tests (1) Review or order medicine tests (1) Review of medication regiment & side effects (2) Review of new medications or change in dosage (2)  I certify that inpatient services furnished can reasonably be expected to improve the patient's condition.   Thedore Mins, MD 03/03/2014, 11:19 AM

## 2014-03-03 NOTE — Progress Notes (Signed)
Patient ID: [name], male   DOB: 1970/09/24, 43 y.o.   MRN: [id number] D. Patient presents with depressed mood,affect congruent. [name] reports that he is ''doing fine. '' this morning. His thoughts remain disorganized at times, and he is intrusive at times. He states he slept poorly last night but otherwise denies any acute concerns. He states ''I'm doing ok. The haldol is what gave me the voices. And I'm allergic to the nicotine patch, I don't want that. '' He denies any SI/HI/A/V Hallucinations. He has been interactive on the unit, attending unit programming. A. Medications given as ordered. Support and encouragement provided.R. Patient is in no acute distress at this time. No further voiced concerns at this time. Will continue to monitor q 15 minutes for safety.

## 2014-03-03 NOTE — Tx Team (Signed)
Interdisciplinary Treatment Plan Update (Adult)  Date: 03/03/2014  Time Reviewed:  9:45 AM  Progress in Treatment: Attending groups: Yes Participating in groups:  Yes Taking medication as prescribed:  Yes Tolerating medication:  Yes Family/Significant othe contact made: No, N/A Patient understands diagnosis:  Yes Discussing patient identified problems/goals with staff:  Yes Medical problems stabilized or resolved:  Yes Denies suicidal/homicidal ideation: Yes Issues/concerns per patient self-inventory:  Yes Other:  New problem(s) identified: N/A  Discharge Plan or Barriers: Pt will follow up at [facility] for outpatient medication management and therapy.    Reason for Continuation of Hospitalization: Anxiety Depression Medication Stabilization Psychosis  Comments: N/A  Estimated length of stay: 1 day, d/c tomorrow  For review of initial/current patient goals, please see plan of care.  Attendees: Patient:     Family:     Physician:  Dr. Darleene Cleaver 03/03/2014 9:53 AM   Nursing:   Hollice Gong, RN 03/03/2014 9:53 AM   Clinical Social Worker:  Regan Lemming, LCSW 03/03/2014 9:53 AM   Other: Marguerita Beards, RN 03/03/2014 9:53 AM   Other: Norberto Sorenson, care coordination 03/03/2014 9:53 AM   Other:   03/03/2014 9:53 AM   Other:     Other:    Other:    Other:    Other:    Other:      Scribe for Treatment Team:   Ane Payment, 03/03/2014 , 9:53 AM

## 2014-03-03 NOTE — Progress Notes (Signed)
D: Pt is anxious in affect and labile in mood. Pt can be demanding for medication at times. Pt was upset when this writer told him that his PRN Vistaril was discontinued. Pt apologized for his rude response. Pt often paces the halls throughout the shift.  Pt attended group this evening. Pt can be intrusive and disruptive of the milieu. However, pt has been redirectable. Pt reports AVH. Pt is currently denying any SI/HI/AVH A: Writer administered scheduled and prn medications to pt. Continued support and availability as needed was extended to this pt. Staff continue to monitor pt with q44min checks.  R: No adverse drug reactions noted. Pt receptive to treatment. Pt remains safe at this time.

## 2014-03-04 LAB — CARBAMAZEPINE LEVEL, TOTAL: Carbamazepine Lvl: 5.9 ug/mL (ref 4.0–12.0)

## 2014-03-04 MED ORDER — DIPHENHYDRAMINE HCL 50 MG/ML IJ SOLN
50.0000 mg | Freq: Once | INTRAMUSCULAR | Status: AC
  Administered 2014-03-04: 50 mg via INTRAMUSCULAR
  Filled 2014-03-04 (×2): qty 1

## 2014-03-04 MED ORDER — DIPHENHYDRAMINE HCL 50 MG PO CAPS
50.0000 mg | ORAL_CAPSULE | Freq: Once | ORAL | Status: AC
  Administered 2014-03-04: 50 mg via ORAL
  Filled 2014-03-04: qty 2
  Filled 2014-03-04: qty 1

## 2014-03-04 MED ORDER — HALOPERIDOL LACTATE 5 MG/ML IJ SOLN
10.0000 mg | Freq: Once | INTRAMUSCULAR | Status: AC
  Administered 2014-03-04: 10 mg via INTRAMUSCULAR
  Filled 2014-03-04 (×2): qty 2

## 2014-03-04 MED ORDER — LORAZEPAM 2 MG/ML IJ SOLN
2.0000 mg | Freq: Once | INTRAMUSCULAR | Status: AC
  Administered 2014-03-04: 2 mg via INTRAMUSCULAR
  Filled 2014-03-04: qty 1

## 2014-03-04 NOTE — Progress Notes (Signed)
Patient ID: [name], male   DOB: [birth year], 43 y.o.   MRN: [id number] Psychoeducational Group Note  Date:  03/04/2014 Time:0930am  Group Topic/Focus:  Identifying Needs:   The focus of this group is to help patients identify their personal needs that have been historically problematic and identify healthy behaviors to address their needs.  Participation Level:  Minimal  Participation Quality:  Resistant  Affect:  Not Congruent  Cognitive:  Confused  Insight:  Lacking  Engagement in Group:  Lacking  Additional Comments:  Healthy coping skills.   Pricilla Larsson 03/04/2014,11:28 AM

## 2014-03-04 NOTE — Progress Notes (Signed)
Patient ID: [name], male   DOB: 06-Oct-1970, 43 y.o.   MRN: [id number] [facility] MD Progress Note  03/04/2014 2:12 PM [name] Estis  MRN:  [id number] Subjective:   Patient states "I am still hearing voices like I always do. Still seeing some things. I haven't slept in 2 days"  Objective:  Patient is seen, his chart reviewed. Pt denies SI and HI, but affirms AVH. Pt is delirious during this assessment and falling asleep talking with this NP. Per nursing report, pt has slept 1 hour per night for 2 days at most and perhaps less. Pt has been on multiple sedating medications with no positive benefit on sleep thus far, even with PRN sedatives. Will order a stronger sedating combination to help pt sleep.    Diagnosis:   DSM5: Total Time spent with patient: 25 minutes AXIS I: Schizoaffective Disorder               Anxiety disorder, NOS              Benzodiazepine dependence AXIS II: Cluster B Traits  AXIS III:  Past Medical History   Diagnosis  Date   .  Migraines    .  Chronic back pain    .  Knee pain, chronic     AXIS IV: economic problems, occupational problems and other psychosocial or environmental problems  AXIS V: Severe Symptoms  ADL's:  Intact  Sleep: fair  Appetite:  Fair  Suicidal Ideation:  Denies Homicidal Ideation:  Denies AEB (as evidenced by):  Psychiatric Specialty Exam: Physical Exam  Review of Systems  Constitutional: Negative.   HENT: Negative.   Eyes: Negative.   Respiratory: Negative.   Cardiovascular: Negative.   Gastrointestinal: Negative.   Genitourinary: Negative.   Musculoskeletal: Positive for joint pain.  Skin: Negative.   Neurological: Negative.   Endo/Heme/Allergies: Negative.   Psychiatric/Behavioral: Positive for hallucinations and substance abuse. Negative for suicidal ideas and memory loss. The patient has insomnia.     Blood pressure 132/87, pulse 76, temperature 97.6 F (36.4 C), temperature source Oral, resp. rate 18, height  6' (1.829 m), weight 96.163 kg (212 lb), SpO2 95.00%.Body mass index is 28.75 kg/(m^2).  General Appearance: Casual  Eye Contact::  Good  Speech:  Pressured  Volume:  Increased  Mood:  Angry and Irritable  Affect:  Labile  Thought Process:  Circumstantial  Orientation:  Full (Time, Place, and Person)  Thought Content:  Delusions  Suicidal Thoughts:  No  Homicidal Thoughts:  No  Memory:  Immediate;   Good Recent;   Fair Remote;   Fair  Judgement:  Impaired  Insight:  Lacking  Psychomotor Activity:  Decreased and Restlessness  Concentration:  Fair  Recall:  [place]: Fair  Akathisia:  No  Handed:  Right  AIMS (if indicated):     Assets:  Communication Skills Desire for Improvement Physical Health  Sleep:  Number of Hours: 1   Musculoskeletal: Strength & Muscle Tone: within normal limits Gait & Station: normal Patient leans: N/A  Current Medications: Current Facility-Administered Medications  Medication Dose Route Frequency Provider Last Rate Last Dose  . acetaminophen (TYLENOL) tablet 650 mg  650 mg Oral Q6H PRN Shuvon Rankin, NP   650 mg at 03/04/14 0052  . alum & mag hydroxide-simeth (MAALOX/MYLANTA) 200-200-20 MG/5ML suspension 30 mL  30 mL Oral Q6H PRN Lurena Nida, NP   30 mL at 02/28/14 1310  . carbamazepine (TEGRETOL XR) 12  hr tablet 200 mg  200 mg Oral BID Mojeed Akintayo   200 mg at 03/04/14 0820  . cyclobenzaprine (FLEXERIL) tablet 10 mg  10 mg Oral TID PRN Laverle Hobby, PA-C      . diphenhydrAMINE (BENADRYL) injection 50 mg  50 mg Intramuscular Once Benjamine Mola, FNP      . doxepin (SINEQUAN) capsule 150 mg  150 mg Oral QHS Mojeed Akintayo   150 mg at 03/03/14 2133  . haloperidol lactate (HALDOL) injection 10 mg  10 mg Intramuscular Once Benjamine Mola, FNP       And  . LORazepam (ATIVAN) injection 2 mg  2 mg Intramuscular Once Benjamine Mola, FNP      . hydrOXYzine (ATARAX/VISTARIL) tablet 25 mg  25 mg Oral TID Mojeed  Akintayo   25 mg at 03/04/14 1209  . magnesium hydroxide (MILK OF MAGNESIA) suspension 30 mL  30 mL Oral Daily PRN Shuvon Rankin, NP   30 mL at 03/03/14 2354  . nicotine (NICODERM CQ - dosed in mg/24 hours) patch 21 mg  21 mg Transdermal Daily Shuvon Rankin, NP      . OLANZapine zydis (ZYPREXA) disintegrating tablet 10 mg  10 mg Oral Q8H PRN Mojeed Akintayo   10 mg at 03/04/14 0820  . ondansetron (ZOFRAN) tablet 4 mg  4 mg Oral Q8H PRN Shuvon Rankin, NP      . pantoprazole (PROTONIX) EC tablet 80 mg  80 mg Oral Daily Lurena Nida, NP   80 mg at 03/04/14 0820  . ziprasidone (GEODON) capsule 80 mg  80 mg Oral BID WC Mojeed Akintayo   80 mg at 03/04/14 0820    Lab Results:  Results for orders placed during the hospital encounter of 02/26/14 (from the past 48 hour(s))  CARBAMAZEPINE LEVEL, TOTAL     Status: None   Collection Time    03/04/14  6:26 AM      Result Value Ref Range   Carbamazepine Lvl 5.9  4.0 - 12.0 ug/mL   Comment: Performed at [facility]    Physical Findings: AIMS: Facial and Oral Movements Muscles of Facial Expression: None, normal Lips and Perioral Area: None, normal Jaw: None, normal Tongue: None, normal,Extremity Movements Upper (arms, wrists, hands, fingers): None, normal Lower (legs, knees, ankles, toes): None, normal, Trunk Movements Neck, shoulders, hips: None, normal, Overall Severity Severity of abnormal movements (highest score from questions above): None, normal Incapacitation due to abnormal movements: None, normal Patient's awareness of abnormal movements (rate only patient's report): No Awareness, Dental Status Current problems with teeth and/or dentures?: No Does patient usually wear dentures?: No  CIWA:  CIWA-Ar Total: 2 COWS:  COWS Total Score: 1  Treatment Plan Summary: Daily contact with patient to assess and evaluate symptoms and progress in treatment Medication management  Plan:  1. Continue crisis management and stabilization.   2. Medication management:  -Continue Geodon to 80 mg BID for psychosis  -Increase Doxepin 150 mg at hs for anxiety/depression/insomnia  -Continue Tegretol XR 200 mg BID for improved mood stability  -Vistaril to 25 mg  TID for anxiety.  -Continue Zyprexa Zydis 10 mg every eight hours prn anxiety or agitation  3. Encouraged patient to attend groups and participate in group counseling sessions and activities.  4. Discharge plan in progress.  5. Continue current treatment plan.  6. Address health issues: X-ray of right hand negative for fracture.  7. Tegretol level on 03/04/14 8. Possible discharge on 03/04/14  Medical  Decision Making Problem Points:  Established problem, improving (1), Review of last therapy session (1) and Review of psycho-social stressors (1)  Data Points:  Review or order clinical lab tests (1) Review or order medicine tests (1) Review of medication regiment & side effects (2) Review of new medications or change in dosage (2)  I certify that inpatient services furnished can reasonably be expected to improve the patient's condition.   Benjamine Mola, FNP-BC 03/04/2014, 2:12 PM  I agree with assessment and plan Geralyn Flash A. Sabra Heck, M.D.

## 2014-03-04 NOTE — Progress Notes (Addendum)
Pt pacing hall all night. Pt agitated , pt appears disorganized and is appearing to talk to him self. Pt has not been asleep since he has been at [facility] according to his check sheets. Marland Kitchen

## 2014-03-04 NOTE — [facility] Group Notes (Signed)
[place] Group Notes:  (Clinical Social Work)  03/04/2014  11:00-11:45AM  Summary of Progress/Problems:   The main focus of today's process group was for the patient to identify ways in which they have in the past sabotaged their own recovery and reasons they may have done this/what they received from doing it.  We then worked to identify a specific plan to avoid doing this when discharged from the hospital for this admission.  The patient expressed early on some thoughts about a "fly paper" that did not make sense.  He mumbled, was hard to get him to speak up, and he eventually realized he was not talking about something which was relevant to the group.  He stated that he stopped his medication for a few days, but then he couldn't "catch up."  Type of Therapy:  Group Therapy - Process  Participation Level:  Minimal  Participation Quality:  Inattentive  Affect:  Flat  Cognitive:  Disorganized  Insight:  Limited  Engagement in Therapy:  Limited  Modes of Intervention:  Clarification, Education, Exploration, Discussion  Selmer Dominion, LCSW 03/04/2014, 1:06 PM

## 2014-03-04 NOTE — Progress Notes (Addendum)
Patient ID: [name], male   DOB: [birth year], 43 y.o.   MRN: [id number] D. Patient presents with irritable mood,,affect labile.  [name] has been irritable this morning, with very pressured tangential speech. His thoughts remain very disorganized as he states to Probation officer this morning '' I'm agitated, I talked to my sister and she was on the phone asking me about my medications. '' Patient later in group stating ''my goal is to steer the ship, I'd like something for my schizophrenia it's acting up, don't jack me up. I need a sperm whale. '' He completed self inventory on physical problems '' knees french fries, fast food face ''.  He remains intrusive at times. Staff report that patient continues to have very poor sleep at night, noting only one hour of sleep. A. Medications given as ordered. Support and encouragement provided. Pts mother phoned with concerns stating '' my son is still very out there, I'm very worried. '' Discussed above information and mothers concerns with Dr. Gerri Spore NP. R. Patient is in no acute distress at this time. No further voiced concerns at this time. Will continue to monitor q 15 minutes for safety.

## 2014-03-04 NOTE — Progress Notes (Signed)
Patient ID: [name], male   DOB: October 21, 1970, 43 y.o.   MRN: [id number] Staff unable to take patients to the gym due to maintenance.

## 2014-03-04 NOTE — Progress Notes (Signed)
Patient ID: [name], male   DOB: 09-May-1971, 43 y.o.   MRN: [id number] Psychoeducational Group Note  Date:  03/04/2014 Time:0910am  Group Topic/Focus:  Identifying Needs:   The focus of this group is to help patients identify their personal needs that have been historically problematic and identify healthy behaviors to address their needs.  Participation Level:  Minimal  Participation Quality:  Resistant  Affect:  Not Congruent  Cognitive:  Disorganized  Insight:  Lacking  Engagement in Group:  Lacking  Additional Comments:  Inventory group   Pricilla Larsson 03/04/2014,11:27 AM
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
## 2014-03-04 NOTE — Progress Notes (Signed)
Child/Adolescent Psychoeducational Group Note  Date:  03/04/2014 Time:  11:12 PM  Group Topic/Focus:  Goals Group:   The focus of this group is to help patients establish daily goals to achieve during treatment and discuss how the patient can incorporate goal setting into their daily lives to aide in recovery.  Participation Level:  Active  Participation Quality:  Appropriate  Affect:  Appropriate  Cognitive:  Appropriate  Insight:  Appropriate  Engagement in Group:  Engaged  Modes of Intervention:  Discussion  Additional Comments:  Pt got along with the others here, and had a good time talking with the others patients.  Alexis Goodell R 03/04/2014, 11:12 PM

## 2014-03-04 NOTE — Progress Notes (Signed)
D: Pt denies SI/HI/AVH. Pt is pleasant and cooperative. Pt disorganized and incoherent at times , but appears to be coherent and together at times. Pt continues to be restless and not sleeping. Pt stated he will stay on meds and go to outpatient services when he leaves, he plans to stay with his mother for a day or so then go back to his place.   A: Pt was offered support and encouragement. Pt was given scheduled medications. Pt was encourage to attend groups. Q 15 minute checks were done for safety.   R:Pt attends groups and interacts well with peers and staff. Pt is taking medication.Pt receptive to treatment and safety maintained on unit.

## 2014-03-04 NOTE — Progress Notes (Signed)
Mother came to retrieve things out of locker # 70. Mother was verified by pt.  Things left in locker #51 : 4 pairs of shorts, black cord, Card (dad), backpack.

## 2014-03-05 MED ORDER — CARBAMAZEPINE ER 200 MG PO TB12
300.0000 mg | ORAL_TABLET | Freq: Two times a day (BID) | ORAL | Status: DC
  Administered 2014-03-05 – 2014-03-06 (×2): 300 mg via ORAL
  Filled 2014-03-05 (×8): qty 1

## 2014-03-05 MED ORDER — BENZTROPINE MESYLATE 1 MG PO TABS
1.0000 mg | ORAL_TABLET | Freq: Two times a day (BID) | ORAL | Status: DC
  Administered 2014-03-05: 1 mg via ORAL
  Filled 2014-03-05 (×3): qty 1

## 2014-03-05 MED ORDER — METHOCARBAMOL 750 MG PO TABS
750.0000 mg | ORAL_TABLET | Freq: Three times a day (TID) | ORAL | Status: DC | PRN
  Administered 2014-03-05: 750 mg via ORAL

## 2014-03-05 NOTE — Progress Notes (Signed)
Patient ID: [name], male   DOB: 1971-01-05, 43 y.o.   MRN: [id number] Psychoeducational Group Note  Date:  03/05/2014 Time:  0910am  Group Topic/Focus:  Making Healthy Choices:   The focus of this group is to help patients identify negative/unhealthy choices they were using prior to admission and identify positive/healthier coping strategies to replace them upon discharge.  Participation Level:  Minimal  Participation Quality:  Inattentive  Affect:  Irritable  Cognitive:  Confused  Insight:  Resistant  Engagement in Group:  Resistant  Additional Comments:  Inventory group   Pricilla Larsson 03/05/2014,12:30 PM

## 2014-03-05 NOTE — Progress Notes (Signed)
Did not attended group 

## 2014-03-05 NOTE — Progress Notes (Signed)
Patient ID: [name], male   DOB: [birth year], 43 y.o.   MRN: [id number] Psychoeducational Group Note  Date:  03/05/2014 Time:  0930am  Group Topic/Focus:  Making Healthy Choices:   The focus of this group is to help patients identify negative/unhealthy choices they were using prior to admission and identify positive/healthier coping strategies to replace them upon discharge.  Participation Level:  Minimal  Participation Quality:  Inattentive  Affect:  Irritable  Cognitive:  Confused  Insight:  Resistant  Engagement in Group:  Resistant  Additional Comments:  Healthy support systems.   Pricilla Larsson 03/05/2014,12:31 PM

## 2014-03-05 NOTE — Progress Notes (Signed)
D: Patient denies SI/HI/AVH. Patient rates hopelessness as 0 and depression as 0.  Patient affect is and mood are anxious.  Patient did NOT attend evening group. Patient visible on the milieu. No distress noted. A: Support and encouragement offered. Scheduled medications given to pt. Q 15 min checks continued for patient safety. R: Patient receptive. Patient remains safe on the unit.

## 2014-03-05 NOTE — B[facility] Group Notes (Signed)
[facility] Group Notes:  (Clinical Social Work)  03/05/2014   11:15am-12:00pm  Summary of Progress/Problems:  The main focus of today's process group was to listen to a variety of genres of music and to identify that different types of music provoke different responses.  The patient then was able to identify personally what was soothing for them, as well as energizing.  Handouts were used to record feelings evoked, as well as how patient can personally use this knowledge in sleep habits, with depression, and with other symptoms.  The patient expressed understanding of concepts, as well as knowledge of how each type of music affected him/her and how this can be used at home as a wellness/recovery tool.  He was initially resistant to group, but ended up participating well and enjoying it.  Type of Therapy:  Music Therapy   Participation Level:  Active  Participation Quality:  Attentive and Sharing  Affect:  Flat  Cognitive:  Oriented  Insight:  Engaged  Engagement in Therapy:  Engaged  Modes of Intervention:   Activity, Exploration  Selmer Dominion, LCSW 03/05/2014, 12:30pm

## 2014-03-05 NOTE — Progress Notes (Signed)
Patient ID: [name], male   DOB: 28-Apr-1971, 43 y.o.   MRN: [id number] [facility] MD Progress Note  03/05/2014 6:22 PM [name] Mcelhiney  MRN:  [id number]  Subjective:   Patient states "I finally slept last night. The medication I took yesterday helped me get a good night's sleep and then I slept for another 3-4 hour nap today so I feel much better. The voices are almost gone and much better. I'm not seeing any hallucinations now".   Objective:  Pt seen and chart reviewed. Pt observed in hallway, walking, not interacting with others at time of assessment. Pt denies SI, HI, and VH, but affirms mild AH. Pt is responding appropriately during the assessment.    Diagnosis:   DSM5: Total Time spent with patient: 25 minutes AXIS I: Schizoaffective Disorder               Anxiety disorder, NOS              Benzodiazepine dependence AXIS II: Cluster B Traits  AXIS III:  Past Medical History   Diagnosis  Date   .  Migraines    .  Chronic back pain    .  Knee pain, chronic     AXIS IV: economic problems, occupational problems and other psychosocial or environmental problems  AXIS V: Severe Symptoms  ADL's:  Intact  Sleep: fair  Appetite:  Fair  Suicidal Ideation:  Denies Homicidal Ideation:  Denies AEB (as evidenced by):  Psychiatric Specialty Exam: Physical Exam  Review of Systems  Constitutional: Negative.   HENT: Negative.   Eyes: Negative.   Respiratory: Negative.   Cardiovascular: Negative.   Gastrointestinal: Negative.   Genitourinary: Negative.   Musculoskeletal: Positive for joint pain.  Skin: Negative.   Neurological: Negative.   Endo/Heme/Allergies: Negative.   Psychiatric/Behavioral: Positive for hallucinations and substance abuse. Negative for suicidal ideas and memory loss. The patient has insomnia.     Blood pressure 132/87, pulse 76, temperature 97.6 F (36.4 C), temperature source Oral, resp. rate 18, height 6' (1.829 m), weight 96.163 kg (212 lb), SpO2  95.00%.Body mass index is 28.75 kg/(m^2).  General Appearance: Casual  Eye Contact::  Good  Speech:  Pressured  Volume:  Increased  Mood:  Angry and Irritable  Affect:  Labile  Thought Process:  Circumstantial  Orientation:  Full (Time, Place, and Person)  Thought Content:  Delusions  Suicidal Thoughts:  No  Homicidal Thoughts:  No  Memory:  Immediate;   Good Recent;   Fair Remote;   Fair  Judgement:  Impaired  Insight:  Lacking  Psychomotor Activity:  Decreased and Restlessness  Concentration:  Fair  Recall:  [employer] of Knowledge:Fair  Language: Fair  Akathisia:  No  Handed:  Right  AIMS (if indicated):     Assets:  Communication Skills Desire for Improvement Physical Health  Sleep:  Number of Hours: 5.75   Musculoskeletal: Strength & Muscle Tone: within normal limits Gait & Station: normal Patient leans: N/A  Current Medications: Current Facility-Administered Medications  Medication Dose Route Frequency Provider Last Rate Last Dose  . acetaminophen (TYLENOL) tablet 650 mg  650 mg Oral Q6H PRN Shuvon Rankin, NP   650 mg at 03/04/14 0052  . alum & mag hydroxide-simeth (MAALOX/MYLANTA) 200-200-20 MG/5ML suspension 30 mL  30 mL Oral Q6H PRN Lurena Nida, NP   30 mL at 02/28/14 1310  . benztropine (COGENTIN) tablet 1 mg  1 mg Oral BID Benjamine Mola, FNP  1 mg at 03/05/14 1553  . carbamazepine (TEGRETOL XR) 12 hr tablet 300 mg  300 mg Oral BID Benjamine Mola, FNP   300 mg at 03/05/14 1551  . doxepin (SINEQUAN) capsule 150 mg  150 mg Oral QHS Mojeed Akintayo   150 mg at 03/04/14 2155  . hydrOXYzine (ATARAX/VISTARIL) tablet 25 mg  25 mg Oral TID Mojeed Akintayo   25 mg at 03/05/14 1551  . magnesium hydroxide (MILK OF MAGNESIA) suspension 30 mL  30 mL Oral Daily PRN Shuvon Rankin, NP   30 mL at 03/03/14 2354  . methocarbamol (ROBAXIN) tablet 750 mg  750 mg Oral TID PRN Benjamine Mola, FNP      . nicotine (NICODERM CQ - dosed in mg/24 hours) patch 21 mg  21 mg Transdermal  Daily Shuvon Rankin, NP      . OLANZapine zydis (ZYPREXA) disintegrating tablet 10 mg  10 mg Oral Q8H PRN Mojeed Akintayo   10 mg at 03/05/14 0747  . ondansetron (ZOFRAN) tablet 4 mg  4 mg Oral Q8H PRN Shuvon Rankin, NP      . pantoprazole (PROTONIX) EC tablet 80 mg  80 mg Oral Daily Lurena Nida, NP   80 mg at 03/05/14 0747  . ziprasidone (GEODON) capsule 80 mg  80 mg Oral BID WC Mojeed Akintayo   80 mg at 03/05/14 1551    Lab Results:  Results for orders placed during the hospital encounter of 02/26/14 (from the past 48 hour(s))  CARBAMAZEPINE LEVEL, TOTAL     Status: None   Collection Time    03/04/14  6:26 AM      Result Value Ref Range   Carbamazepine Lvl 5.9  4.0 - 12.0 ug/mL   Comment: Performed at [facility]    Physical Findings: AIMS: Facial and Oral Movements Muscles of Facial Expression: None, normal Lips and Perioral Area: None, normal Jaw: None, normal Tongue: None, normal,Extremity Movements Upper (arms, wrists, hands, fingers): None, normal Lower (legs, knees, ankles, toes): None, normal, Trunk Movements Neck, shoulders, hips: None, normal, Overall Severity Severity of abnormal movements (highest score from questions above): None, normal Incapacitation due to abnormal movements: None, normal Patient's awareness of abnormal movements (rate only patient's report): No Awareness, Dental Status Current problems with teeth and/or dentures?: No Does patient usually wear dentures?: No  CIWA:  CIWA-Ar Total: 2 COWS:  COWS Total Score: 1  Treatment Plan Summary: Daily contact with patient to assess and evaluate symptoms and progress in treatment Medication management  Plan:  1. Continue crisis management and stabilization.  2. Medication management:  -Continue Geodon to 80 mg BID for psychosis  -Increase Doxepin 150 mg at hs for anxiety/depression/insomnia  -Continue Tegretol XR 200 mg BID for improved mood stability  -Vistaril to 25 mg  TID for anxiety.   -Continue Zyprexa Zydis 10 mg every eight hours prn anxiety or agitation  3. Encouraged patient to attend groups and participate in group counseling sessions and activities.  4. Discharge plan in progress.  5. Continue current treatment plan.  6. Address health issues: X-ray of right hand negative for fracture.  7. Tegretol level on 03/04/14  8. Possible discharge on 03/04/14   Medical Decision Making Problem Points:  Established problem, improving (1), Review of last therapy session (1) and Review of psycho-social stressors (1)  Data Points:  Review or order clinical lab tests (1) Review or order medicine tests (1) Review of medication regiment & side effects (2) Review of new  medications or change in dosage (2)  I certify that inpatient services furnished can reasonably be expected to improve the patient's condition.   Benjamine Mola, FNP-BC 03/05/2014, 6:22 PM  I agree with assessment and plan Geralyn Flash A. Sabra Heck, M.D.

## 2014-03-05 NOTE — Progress Notes (Signed)
Patient ID: [name], male   DOB: 01-29-1971, 43 y.o.   MRN: [id number] Pt requesting information regarding his discharge stating '' I thought I was going home today. [name] NP notified of above information at 1000, as patient pacing hallway and restless at this time. Will continue to monitor.

## 2014-03-05 NOTE — Progress Notes (Signed)
Patient ID: [name], male   DOB: 23-Jan-1971, 44 y.o.   MRN: [id number] D. Patient presents with irritable mood,,affect labile. [name]'s speech remains slurred and tangential. His thoughts are disorganized and difficult to follow. A. Medications given as ordered. Support and encouragement provided.Discussed above information and mothers concerns with [name] NP. R. Patient is in no acute distress at this time. No further voiced concerns at this time. Will continue to monitor q 15 minutes for safety.

## 2014-03-06 MED ORDER — OMEPRAZOLE 40 MG PO CPDR: 40.0000 mg | DELAYED_RELEASE_CAPSULE | Freq: Two times a day (BID) | ORAL | Status: DC

## 2014-03-06 MED ORDER — AMANTADINE HCL 100 MG PO CAPS
100.0000 mg | ORAL_CAPSULE | Freq: Two times a day (BID) | ORAL | Status: DC
  Filled 2014-03-06 (×2): qty 1
  Filled 2014-03-06 (×2): qty 6
  Filled 2014-03-06: qty 1
  Filled 2014-03-06 (×2): qty 6
  Filled 2014-03-06: qty 1

## 2014-03-06 MED ORDER — CARBAMAZEPINE ER 100 MG PO TB12: 300.0000 mg | ORAL_TABLET | Freq: Two times a day (BID) | ORAL | Status: DC

## 2014-03-06 MED ORDER — DOXEPIN HCL 150 MG PO CAPS: 150.0000 mg | ORAL_CAPSULE | Freq: Every day | ORAL | Status: DC

## 2014-03-06 MED ORDER — ZIPRASIDONE HCL 80 MG PO CAPS: 80.0000 mg | ORAL_CAPSULE | Freq: Two times a day (BID) | ORAL | Status: DC

## 2014-03-06 MED ORDER — HYDROXYZINE HCL 25 MG PO TABS: 25.0000 mg | ORAL_TABLET | Freq: Three times a day (TID) | ORAL | Status: DC

## 2014-03-06 MED ORDER — AMANTADINE HCL 100 MG PO CAPS: 100.0000 mg | ORAL_CAPSULE | Freq: Two times a day (BID) | ORAL | Status: DC

## 2014-03-06 MED ORDER — BENZTROPINE MESYLATE 1 MG PO TABS: 1.0000 mg | ORAL_TABLET | Freq: Two times a day (BID) | ORAL | Status: DC

## 2014-03-06 MED ORDER — CARBAMAZEPINE ER 100 MG PO TB12
300.0000 mg | ORAL_TABLET | Freq: Two times a day (BID) | ORAL | Status: DC
  Filled 2014-03-06: qty 18

## 2014-03-06 NOTE — Progress Notes (Signed)
Patient ID: [name], male   DOB: Mar 05, 1971, 43 y.o.   MRN: [id number]  D: Patient feels ready for discharge. Reports decrease in hearing voices and no SI at present. Treatment team felt that he was ready to go. A: Obtained all belongings, sample medications, follow up appointment, and prescriptions given. R: Mother here to pick up at this time.
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
## 2014-03-06 NOTE — Progress Notes (Signed)
Pt was up in the hall when writer began shift at 2300.  Pt reports he is ok, but has not been able to fall asleep as yet for the night.  Previous RN reports that pt was sleeping earlier in the shift.  Pt voices no complaints of pain or discomfort.  Support and encouragement was offered.  At this time, pt is in bed with eyes closed.  No distress observed.  Safety maintained with q15 minute checks.

## 2014-03-06 NOTE — Discharge Summary (Signed)
Physician Discharge Summary Note  Patient:  [name] is an 43 y.o., male MRN:  [id number] DOB:  January 04, 1971 Patient phone:  [phone] (home)  Patient address:   [address] [address],  Total Time spent with patient: 20 minutes  Date of Admission:  02/26/2014 Date of Discharge: 03/06/14  Reason for Admission:  Anxiety, Mood lability  Discharge Diagnoses: Principal Problem:   Schizoaffective disorder, bipolar type Active Problems:   Schizoaffective disorder   Psychosis   Anxiety state, unspecified   Benzodiazepine dependence   Psychiatric Specialty Exam: Physical Exam  Review of Systems  Constitutional: Negative.   HENT: Negative.   Eyes: Negative.   Respiratory: Negative.   Cardiovascular: Negative.   Gastrointestinal: Negative.   Genitourinary: Negative.   Musculoskeletal: Negative.   Skin: Negative.   Neurological: Negative.   Endo/Heme/Allergies: Negative.   Psychiatric/Behavioral: Negative.     Blood pressure 97/60, pulse 81, temperature 98.3 F (36.8 C), temperature source Oral, resp. rate 20, height 6' (1.829 m), weight 96.163 kg (212 lb), SpO2 95.00%.Body mass index is 28.75 kg/(m^2).  See Physician SRA                                                  Past Psychiatric History: See H&P Diagnosis:  Hospitalizations:  Outpatient Care:  Substance Abuse Care:  Self-Mutilation:  Suicidal Attempts:  Violent Behaviors:   Musculoskeletal: Strength & Muscle Tone: within normal limits Gait & Station: normal Patient leans: N/A  DSM5:  AXIS I: Schizoaffective disorder, bipolar type  Anxiety disorder, unspecified.  Benzodiazepine dependence  AXIS II: Cluster B Traits  AXIS III:  Past Medical History   Diagnosis  Date   .  Migraines    .  Chronic back pain    .  Knee pain, chronic    AXIS IV: other psychosocial or environmental problems and problems related to social environment  AXIS V: 61-70  mild symptoms  Level of Care:  OP  Hospital Course:   [name] Bevacqua is a 43 year old male who came to the [facility] due to hearing voices for the past two weeks. The patient reports that he has not been hospitalized in several years. In the ED the patient admitted to overusing his xanax. Patient states today during his psychiatric assessment "I was hearing voices for a few weeks. I hear them coming out of the air conditioner. I need more xanax. I get really mean and have panic attacks. I would not overdose on xanax. Because I like it. I have bad anxiety. I was diagnosed as schizoaffective back in 1989. I am not sure what triggered my symptoms." During the assessment the patient denied feeling paranoid. The assessment note completed by the counselor at [facility] indicates that the patient was afraid that the Muslim apartment apartment complex owner was working with [employer] to make him hear voices. Today the patient is very focused on getting his xanax dosage increased. He was informed by treatment team members that this was not the treatment of choice for his diagnosis. Upon leaving the interview the patient proclaimed that he would go just drink milk to cope with his anxiety. The patient appeared very anxious and restless today during conversation.          [name] Licklider was admitted to the adult 400 unit. He was  evaluated and his symptoms were identified. Medication management was discussed and initiated. The patient was not taking any medication for mental health prior to admission. Patient was started on Geodon for psychosis, Tegretol for improved mood stability, Doxepin for sleep, Vistaril for anxiety, and Cogentin for EPS prevention. Patient demonstrated medication seeking behaviors for benzos. He became agitated on one occasion and began to hit the glass around unit. The patient was sent to the ED for evaluation but no abnormalities were found.  He was oriented to the unit and encouraged to participate in unit  programming. Medical problems were identified and treated appropriately. Home medication was restarted as needed.        The patient was evaluated each day by a clinical provider to ascertain the patient's response to treatment.  Improvement was noted by the patient's report of decreasing symptoms, improved sleep and appetite, affect, medication tolerance, behavior, and participation in unit programming.  He was asked each day to complete a self inventory noting mood, mental status, pain, new symptoms, anxiety and concerns.         He responded well to medication and being in a therapeutic and supportive environment. At first the patient was very irritable and labile with staff. Positive and appropriate behavior was noted and the patient was motivated for recovery.  The patient worked closely with the treatment team and case manager to develop a discharge plan with appropriate goals. Coping skills, problem solving as well as relaxation therapies were also part of the unit programming.         By the day of discharge he was in much improved condition than upon admission.  Symptoms were reported as significantly decreased or resolved completely.  The patient denied SI/HI and voiced no AVH. He was motivated to continue taking medication with a goal of continued improvement in mental health. [name] Verbeke was discharged home with a plan to follow up as noted below.  Consults:  psychiatry  Significant Diagnostic Studies:  Chem profile, Tegretol level   Discharge Vitals:   Blood pressure 97/60, pulse 81, temperature 98.3 F (36.8 C), temperature source Oral, resp. rate 20, height 6' (1.829 m), weight 96.163 kg (212 lb), SpO2 95.00%. Body mass index is 28.75 kg/(m^2). Lab Results:   Results for orders placed during the hospital encounter of 02/26/14 (from the past 72 hour(s))  CARBAMAZEPINE LEVEL, TOTAL     Status: None   Collection Time    03/04/14  6:26 AM      Result Value Ref Range    Carbamazepine Lvl 5.9  4.0 - 12.0 ug/mL   Comment: Performed at [facility]    Physical Findings: AIMS: Facial and Oral Movements Muscles of Facial Expression: None, normal Lips and Perioral Area: None, normal Jaw: None, normal Tongue: None, normal,Extremity Movements Upper (arms, wrists, hands, fingers): None, normal Lower (legs, knees, ankles, toes): None, normal, Trunk Movements Neck, shoulders, hips: None, normal, Overall Severity Severity of abnormal movements (highest score from questions above): None, normal Incapacitation due to abnormal movements: None, normal Patient's awareness of abnormal movements (rate only patient's report): No Awareness, Dental Status Current problems with teeth and/or dentures?: No Does patient usually wear dentures?: No  CIWA:  CIWA-Ar Total: 2 COWS:  COWS Total Score: 1  Psychiatric Specialty Exam: See Psychiatric Specialty Exam and Suicide Risk Assessment completed by Attending Physician prior to discharge.  Discharge destination:  Home  Is patient on multiple antipsychotic therapies at discharge:  No   Has Patient  had three or more failed trials of antipsychotic monotherapy by history:  No  Recommended Plan for Multiple Antipsychotic Therapies: NA     Medication List       Indication   acetaminophen 500 MG tablet  Commonly known as:  TYLENOL  Take 1,000 mg by mouth every 6 (six) hours as needed for mild pain.      benztropine 1 MG tablet  Commonly known as:  COGENTIN  Take 1 tablet (1 mg total) by mouth 2 (two) times daily.   Indication:  Extrapyramidal Reaction caused by Medications     carbamazepine 100 MG 12 hr tablet  Commonly known as:  TEGRETOL XR  Take 3 tablets (300 mg total) by mouth 2 (two) times daily.   Indication:  Manic-Depression     cyclobenzaprine 10 MG tablet  Commonly known as:  FLEXERIL  Take 10 mg by mouth 3 (three) times daily as needed for muscle spasms.      doxepin 150 MG capsule  Commonly  known as:  SINEQUAN  Take 1 capsule (150 mg total) by mouth at bedtime.   Indication:  Depression, Insomnia     GOODYS BODY PAIN PO  Take 1 packet by mouth daily as needed. For pain      HYDROcodone-acetaminophen 10-325 MG per tablet  Commonly known as:  NORCO  Take 1 tablet by mouth 4 (four) times daily as needed for moderate pain. For pain      hydrOXYzine 25 MG tablet  Commonly known as:  ATARAX/VISTARIL  Take 1 tablet (25 mg total) by mouth 3 (three) times daily.   Indication:  Anxiety Neurosis     omeprazole 40 MG capsule  Commonly known as:  PRILOSEC  Take 1 capsule (40 mg total) by mouth 2 (two) times daily.   Indication:  Gastroesophageal Reflux Disease     ziprasidone 80 MG capsule  Commonly known as:  GEODON  Take 1 capsule (80 mg total) by mouth 2 (two) times daily with a meal.   Indication:  Manic-Depression, Schizophrenia           Follow-up Information   Follow up with [facility] On 03/07/2014. (Walk in for hospital discharge appointment, Monday - Friday 8 am - 3 pm.  They will than schedule you for medication management and therapy.  )    Contact information:   [address] [place] [address] Phone: [phone] Fax: [phone]      Follow-up recommendations:   Activity: as tolerated  Diet: healthy  Tests: Tegretol Level: 5.9  Other: patient to keep his after care appointment   Comments:    Take all your medications as prescribed by your mental healthcare provider.  Report any adverse effects and or reactions from your medicines to your outpatient provider promptly.  Patient is instructed and cautioned to not engage in alcohol and or illegal drug use while on prescription medicines.  In the event of worsening symptoms, patient is instructed to call the crisis hotline, 911 and or go to the nearest ED for appropriate evaluation and treatment of symptoms.  Follow-up with your primary care provider for your other medical issues, concerns and or  health care needs.   Total Discharge Time:  Greater than 30 minutes.  SignedElmarie Shiley NP-C 03/06/2014, 9:31 AM  Patient seen, evaluated and I agree with notes by Nurse Practitioner. Corena Pilgrim, MD

## 2014-03-06 NOTE — [facility] Suicide Risk Assessment (Addendum)
   Demographic Factors:  Male, Caucasian, Low socioeconomic status and Unemployed  Total Time spent with patient: 20 minutes  Psychiatric Specialty Exam: Physical Exam  Psychiatric: He has a normal mood and affect. His speech is normal and behavior is normal. Judgment and thought content normal. Cognition and memory are normal.    Review of Systems  Constitutional: Negative.   HENT: Negative.   Eyes: Negative.   Respiratory: Negative.   Cardiovascular: Negative.   Gastrointestinal: Negative.   Genitourinary: Negative.   Musculoskeletal: Negative.   Skin: Negative.   Neurological: Negative.   Endo/Heme/Allergies: Negative.   Psychiatric/Behavioral: Negative.     Blood pressure 97/60, pulse 81, temperature 98.3 F (36.8 C), temperature source Oral, resp. rate 20, height 6' (1.829 m), weight 96.163 kg (212 lb), SpO2 95.00%.Body mass index is 28.75 kg/(m^2).  General Appearance: Fairly Groomed  Engineer, water::  Good  Speech:  Clear and Coherent and Normal Rate  Volume:  Normal  Mood:  Euthymic  Affect:  Appropriate  Thought Process:  Goal Directed  Orientation:  Full (Time, Place, and Person)  Thought Content:  Negative  Suicidal Thoughts:  No  Homicidal Thoughts:  No  Memory:  Immediate;   Fair Recent;   Fair Remote;   Fair  Judgement:  Fair  Insight:  Fair  Psychomotor Activity:  Normal  Concentration:  Fair  Recall:  [employer] of Knowledge:Fair  Language: Good  Akathisia:  No  Handed:  Right  AIMS (if indicated):     Assets:  Communication Skills Desire for Improvement Physical Health  Sleep:  Number of Hours: 2    Musculoskeletal: Strength & Muscle Tone: within normal limits Gait & Station: normal Patient leans: Right   Mental Status Per Nursing Assessment::   On Admission:     Current Mental Status by Physician: patient denies suicidal ideation, intent or plan  Loss Factors: NA  Historical Factors: poor impulse control  Risk Reduction Factors:    Living with another person, especially a relative and Positive social support  Continued Clinical Symptoms:  resolving mood and delusional symptoms.  Cognitive Features That Contribute To Risk:  Closed-mindedness Polarized thinking    Suicide Risk:  Minimal: No identifiable suicidal ideation.  Patients presenting with no risk factors but with morbid ruminations; may be classified as minimal risk based on the severity of the depressive symptoms  Discharge Diagnoses:   AXIS I:  Schizoaffective disorder, bipolar type              Anxiety disorder, unspecified.              Benzodiazepine dependence AXIS II:  Cluster B Traits AXIS III:   Past Medical History  Diagnosis Date  . Migraines   . Chronic back pain   . Knee pain, chronic    AXIS IV:  other psychosocial or environmental problems and problems related to social environment AXIS V:  61-70 mild symptoms  Plan Of Care/Follow-up recommendations:  Activity:  as tolerated  Diet:  healthy Tests:  Tegretol Level: 5.9 Other:  patient to keep his after care appointment  Is patient on multiple antipsychotic therapies at discharge:  No   Has Patient had three or more failed trials of antipsychotic monotherapy by history:  No  Recommended Plan for Multiple Antipsychotic Therapies: NA    Corena Pilgrim, MD 03/06/2014, 9:25 AM

## 2014-03-09 NOTE — Progress Notes (Signed)
Patient Discharge Instructions:  After Visit Summary (AVS):   Faxed to:  03/09/14 Discharge Summary Note:   Faxed to:  03/09/14 Psychiatric Admission Assessment Note:   Faxed to:  03/09/14 Suicide Risk Assessment - Discharge Assessment:   Faxed to:  03/09/14 Faxed/Sent to the Next Level Care provider:  03/09/14 Faxed to [facility], 03/09/2014, 2:57 PM

## 2014-03-26 ENCOUNTER — Emergency Department (INDEPENDENT_AMBULATORY_CARE_PROVIDER_SITE_OTHER)
Admission: EM | Admit: 2014-03-26 | Discharge: 2014-03-26 | Disposition: A | Discharge status: Home / Self Care | Payer: Medicare Other | Source: Home / Self Care | Attending: Emergency Medicine | Admitting: Emergency Medicine

## 2014-03-26 ENCOUNTER — Encounter (HOSPITAL_COMMUNITY): Payer: Self-pay | Admitting: Emergency Medicine

## 2014-03-26 DIAGNOSIS — J040 Acute laryngitis: Secondary | ICD-10-CM

## 2014-03-26 LAB — POCT RAPID STREP A: Streptococcus, Group A Screen (Direct): NEGATIVE

## 2014-03-26 MED ORDER — DEXTROMETHORPHAN POLISTIREX 30 MG/5ML PO LQCR: 60.0000 mg | Freq: Two times a day (BID) | ORAL | Status: DC

## 2014-03-26 MED ORDER — BUDESONIDE-FORMOTEROL FUMARATE 160-4.5 MCG/ACT IN AERO: 2.0000 | INHALATION_SPRAY | Freq: Two times a day (BID) | RESPIRATORY_TRACT | Status: DC

## 2014-03-26 NOTE — Discharge Instructions (Signed)
Laryngitis °At the top of your windpipe is your voice box. It is the source of your voice. Inside your voice box are 2 bands of muscles called vocal cords. When you breathe, your vocal cords are relaxed and open so that air can get into the lungs. When you decide to say something, these cords come together and vibrate. The sound from these vibrations goes into your throat and comes out through your mouth as sound.  °Laryngitis is an inflammation of the vocal cords that causes hoarseness, cough, loss of voice, sore throat, and dry throat. Laryngitis can be temporary (acute) or long-term (chronic). Most cases of acute laryngitis improve with time.Chronic laryngitis lasts for more than 3 weeks. °CAUSES °Laryngitis can often be related to excessive smoking, talking, or yelling, as well as inhalation of toxic fumes and allergies. Acute laryngitis is usually caused by a viral infection, vocal strain, measles or mumps, or bacterial infections. Chronic laryngitis is usually caused by vocal cord strain, vocal cord injury, postnasal drip, growths on the vocal cords, or acid reflux. °SYMPTOMS  °· Cough. °· Sore throat. °· Dry throat. °RISK FACTORS °· Respiratory infections. °· Exposure to irritating substances, such as cigarette smoke, excessive amounts of alcohol, stomach acids, and workplace chemicals. °· Voice trauma, such as vocal cord injury from shouting or speaking too loud. °DIAGNOSIS  °Your cargiver will perform a physical exam. During the physical exam, your caregiver will examine your throat. The most common sign of laryngitis is hoarseness. Laryngoscopy may be necessary to confirm the diagnosis of this condition. This procedure allows your caregiver to look into the larynx. °HOME CARE INSTRUCTIONS °· Drink enough fluids to keep your urine clear or pale yellow. °· Rest until you no longer have symptoms or as directed by your caregiver. °· Breathe in moist air. °· Take all medicine as directed by your  caregiver. °· Do not smoke. °· Talk as little as possible (this includes whispering). °· Write on paper instead of talking until your voice is back to normal. °· Follow up with your caregiver if your condition has not improved after 10 days. °SEEK MEDICAL CARE IF:  °· You have trouble breathing. °· You cough up blood. °· You have persistent fever. °· You have increasing pain. °· You have difficulty swallowing. °MAKE SURE YOU: °· Understand these instructions. °· Will watch your condition. °· Will get help right away if you are not doing well or get worse. °Document Released: 08/25/2005 Document Revised: 11/17/2011 Document Reviewed: 10/31/2010 °ExitCare® Patient Information ©2015 [employer]. This information is not intended to replace advice given to you by your health care provider. Make sure you discuss any questions you have with your health care provider. ° °

## 2014-03-26 NOTE — ED Provider Notes (Signed)
  Chief Complaint   Chief Complaint  Patient presents with  . Sore Throat    History of Present Illness   [name] is a 43 year old male who's had a 4 to five-day history of sore throat, hoarseness, nasal congestion, rhinorrhea, headache, left ear pain, cough productive of clear sputum, chest tightness, nausea, vomiting, abdominal pain. He's been around several family members and friends who had the same thing.  Review of Systems   Other than as noted above, the patient denies any of the following symptoms: Systemic:  No fevers, chills, sweats, or myalgias. Eye:  No redness or discharge. ENT:  No ear pain, headache, nasal congestion, drainage, sinus pressure, or sore throat. Neck:  No neck pain, stiffness, or swollen glands. Lungs:  No cough, sputum production, hemoptysis, wheezing, chest tightness, shortness of breath or chest pain. GI:  No abdominal pain, nausea, vomiting or diarrhea.  [place]   Past medical history, family history, social history, meds, and allergies were reviewed. He is allergic to Seroquel. Current meds include Symmetrel, Tegretol, Sinequan, hydroxyzine, omeprazole, and Geodon. He has a history of migraines, schizoaffective disorder, and chronic back and knee pain.  Physical exam   Vital signs:  BP 142/81  Pulse 82  Temp(Src) 98.7 F (37.1 C) (Oral)  Resp 17  SpO2 97% General:  Alert and oriented.  In no distress.  Skin warm and dry. Eye:  No conjunctival injection or drainage. Lids were normal. ENT:  TMs and canals were normal, without erythema or inflammation.  Nasal mucosa was clear and uncongested, without drainage.  Mucous membranes were moist.  Pharynx was erythematous with no exudate or drainage.  There were no oral ulcerations or lesions. Neck:  Supple, no adenopathy, tenderness or mass. Lungs:  No respiratory distress.  Lungs were clear to auscultation, without wheezes, rales or rhonchi.  Breath sounds were clear and equal bilaterally.   Heart:  Regular rhythm, without gallops, murmers or rubs. Skin:  Clear, warm, and dry, without rash or lesions.  Labs   Results for orders placed during the hospital encounter of 03/26/14  POCT RAPID STREP A ([facility] URG CARE ONLY)      Result Value Ref Range   Streptococcus, Group A Screen (Direct) NEGATIVE  NEGATIVE    Assessment     The encounter diagnosis was Laryngitis.  Plan    1.  Meds:  The following meds were prescribed:   Discharge Medication List as of 03/26/2014  2:02 PM    START taking these medications   Details  budesonide-formoterol (SYMBICORT) 160-4.5 MCG/ACT inhaler Inhale 2 puffs into the lungs 2 (two) times daily., Starting 03/26/2014, Until Discontinued, Normal    dextromethorphan (DELSYM) 30 MG/5ML liquid Take 10 mLs (60 mg total) by mouth 2 (two) times daily., Starting 03/26/2014, Until Discontinued, Normal        2.  Patient Education/Counseling:  The patient was given appropriate handouts, self care instructions, and instructed in symptomatic relief.  Instructed to get extra fluids, rest, and use a cool mist vaporizer.    3.  Follow up:  The patient was told to follow up here if no better in 3 to 4 days, or sooner if becoming worse in any way, and given some red flag symptoms such as increasing fever, difficulty breathing, chest pain, or persistent vomiting which would prompt immediate return.  Follow up here as needed.      Harden Mo, MD 03/26/14 [phone]

## 2014-03-26 NOTE — ED Notes (Signed)
Pt. Stated, I've had a sore throat and hoarseness for 3-4 days.

## 2014-03-28 LAB — CULTURE, GROUP A STREP

## 2014-04-27 ENCOUNTER — Inpatient Hospital Stay (HOSPITAL_COMMUNITY)
Admission: EM | Admit: 2014-04-27 | Discharge: 2014-04-30 | DRG: 918 | Disposition: A | Discharge status: Home / Self Care | Payer: PRIVATE HEALTH INSURANCE | Attending: Internal Medicine | Admitting: Internal Medicine

## 2014-04-27 ENCOUNTER — Emergency Department (HOSPITAL_COMMUNITY): Payer: PRIVATE HEALTH INSURANCE

## 2014-04-27 ENCOUNTER — Encounter (HOSPITAL_COMMUNITY): Payer: Self-pay | Admitting: Emergency Medicine

## 2014-04-27 DIAGNOSIS — T421X4A Poisoning by iminostilbenes, undetermined, initial encounter: Secondary | ICD-10-CM

## 2014-04-27 DIAGNOSIS — T426X1A Poisoning by other antiepileptic and sedative-hypnotic drugs, accidental (unintentional), initial encounter: Principal | ICD-10-CM | POA: Diagnosis present

## 2014-04-27 DIAGNOSIS — Z609 Problem related to social environment, unspecified: Secondary | ICD-10-CM | POA: Diagnosis not present

## 2014-04-27 DIAGNOSIS — Z23 Encounter for immunization: Secondary | ICD-10-CM | POA: Diagnosis not present

## 2014-04-27 DIAGNOSIS — E876 Hypokalemia: Secondary | ICD-10-CM | POA: Diagnosis present

## 2014-04-27 DIAGNOSIS — Y92009 Unspecified place in unspecified non-institutional (private) residence as the place of occurrence of the external cause: Secondary | ICD-10-CM | POA: Diagnosis not present

## 2014-04-27 DIAGNOSIS — G43909 Migraine, unspecified, not intractable, without status migrainosus: Secondary | ICD-10-CM | POA: Diagnosis present

## 2014-04-27 DIAGNOSIS — T50902S Poisoning by unspecified drugs, medicaments and biological substances, intentional self-harm, sequela: Secondary | ICD-10-CM

## 2014-04-27 DIAGNOSIS — IMO0002 Reserved for concepts with insufficient information to code with codable children: Secondary | ICD-10-CM | POA: Diagnosis present

## 2014-04-27 DIAGNOSIS — F17200 Nicotine dependence, unspecified, uncomplicated: Secondary | ICD-10-CM | POA: Diagnosis present

## 2014-04-27 DIAGNOSIS — F259 Schizoaffective disorder, unspecified: Secondary | ICD-10-CM

## 2014-04-27 DIAGNOSIS — F209 Schizophrenia, unspecified: Secondary | ICD-10-CM | POA: Diagnosis present

## 2014-04-27 DIAGNOSIS — F1320 Sedative, hypnotic or anxiolytic dependence, uncomplicated: Secondary | ICD-10-CM | POA: Diagnosis present

## 2014-04-27 DIAGNOSIS — Z91199 Patient's noncompliance with other medical treatment and regimen due to unspecified reason: Secondary | ICD-10-CM | POA: Diagnosis not present

## 2014-04-27 DIAGNOSIS — F411 Generalized anxiety disorder: Secondary | ICD-10-CM | POA: Diagnosis present

## 2014-04-27 DIAGNOSIS — G8929 Other chronic pain: Secondary | ICD-10-CM | POA: Diagnosis present

## 2014-04-27 DIAGNOSIS — Z9119 Patient's noncompliance with other medical treatment and regimen: Secondary | ICD-10-CM

## 2014-04-27 DIAGNOSIS — M549 Dorsalgia, unspecified: Secondary | ICD-10-CM | POA: Diagnosis present

## 2014-04-27 DIAGNOSIS — F250 Schizoaffective disorder, bipolar type: Secondary | ICD-10-CM | POA: Diagnosis present

## 2014-04-27 DIAGNOSIS — M25569 Pain in unspecified knee: Secondary | ICD-10-CM | POA: Diagnosis present

## 2014-04-27 DIAGNOSIS — T50901A Poisoning by unspecified drugs, medicaments and biological substances, accidental (unintentional), initial encounter: Secondary | ICD-10-CM | POA: Diagnosis present

## 2014-04-27 DIAGNOSIS — T50992A Poisoning by other drugs, medicaments and biological substances, intentional self-harm, initial encounter: Secondary | ICD-10-CM | POA: Diagnosis present

## 2014-04-27 LAB — CBC WITH DIFFERENTIAL/PLATELET
BASOS PCT: 0 % (ref 0–1)
Basophils Absolute: 0 10*3/uL (ref 0.0–0.1)
Eosinophils Absolute: 0 10*3/uL (ref 0.0–0.7)
Eosinophils Relative: 0 % (ref 0–5)
HCT: 47.6 % (ref 39.0–52.0)
HEMOGLOBIN: 16.7 g/dL (ref 13.0–17.0)
Lymphocytes Relative: 7 % — ABNORMAL LOW (ref 12–46)
Lymphs Abs: 1.1 10*3/uL (ref 0.7–4.0)
MCH: 30.5 pg (ref 26.0–34.0)
MCHC: 35.1 g/dL (ref 30.0–36.0)
MCV: 86.9 fL (ref 78.0–100.0)
MONOS PCT: 7 % (ref 3–12)
Monocytes Absolute: 1 10*3/uL (ref 0.1–1.0)
NEUTROS ABS: 12.6 10*3/uL — AB (ref 1.7–7.7)
NEUTROS PCT: 86 % — AB (ref 43–77)
PLATELETS: 246 10*3/uL (ref 150–400)
RBC: 5.48 MIL/uL (ref 4.22–5.81)
RDW: 13.4 % (ref 11.5–15.5)
WBC: 14.7 10*3/uL — ABNORMAL HIGH (ref 4.0–10.5)

## 2014-04-27 LAB — COMPREHENSIVE METABOLIC PANEL
ALBUMIN: 4.4 g/dL (ref 3.5–5.2)
ALK PHOS: 76 U/L (ref 39–117)
ALT: 35 U/L (ref 0–53)
ANION GAP: 15 (ref 5–15)
AST: 26 U/L (ref 0–37)
BILIRUBIN TOTAL: 0.4 mg/dL (ref 0.3–1.2)
BUN: 10 mg/dL (ref 6–23)
CHLORIDE: 101 meq/L (ref 96–112)
CO2: 25 mEq/L (ref 19–32)
Calcium: 10.2 mg/dL (ref 8.4–10.5)
Creatinine, Ser: 0.84 mg/dL (ref 0.50–1.35)
GFR calc Af Amer: >90 mL/min (ref >90)
GFR calc non Af Amer: >90 mL/min (ref >90)
Glucose, Bld: 153 mg/dL — ABNORMAL HIGH (ref 70–99)
POTASSIUM: 4.2 meq/L (ref 3.7–5.3)
SODIUM: 141 meq/L (ref 137–147)
Total Protein: 8.3 g/dL (ref 6.0–8.3)

## 2014-04-27 LAB — ACETAMINOPHEN LEVEL

## 2014-04-27 LAB — RAPID URINE DRUG SCREEN, HOSP PERFORMED
Amphetamines: NOT DETECTED
Barbiturates: NOT DETECTED
Benzodiazepines: POSITIVE — AB
Cocaine: NOT DETECTED
Opiates: NOT DETECTED
Tetrahydrocannabinol: NOT DETECTED

## 2014-04-27 LAB — ETHANOL

## 2014-04-27 LAB — SALICYLATE LEVEL: Salicylate Lvl: <2 mg/dL — ABNORMAL LOW (ref 2.8–20.0)

## 2014-04-27 MED ORDER — ONDANSETRON HCL 4 MG/2ML IJ SOLN
4.0000 mg | Freq: Once | INTRAMUSCULAR | Status: AC
  Administered 2014-04-27: 4 mg via INTRAVENOUS
  Filled 2014-04-27: qty 2

## 2014-04-27 MED ORDER — SODIUM CHLORIDE 0.9 % IV SOLN
INTRAVENOUS | Status: DC
  Administered 2014-04-27: 23:00:00 via INTRAVENOUS

## 2014-04-27 MED ORDER — SODIUM CHLORIDE 0.9 % IV BOLUS (SEPSIS)
1000.0000 mL | Freq: Once | INTRAVENOUS | Status: AC
  Administered 2014-04-27: 1000 mL via INTRAVENOUS

## 2014-04-27 MED ORDER — CHARCOAL ACTIVATED PO LIQD
75.0000 g | Freq: Once | ORAL | Status: AC
Start: 2014-04-28 — End: 2014-04-28
  Administered 2014-04-28: 37.5 g via ORAL
  Filled 2014-04-27 (×2): qty 480

## 2014-04-27 NOTE — ED Notes (Addendum)
Family contact number Sonia(Sister) [phone]

## 2014-04-27 NOTE — ED Provider Notes (Addendum)
CSN: [id number]     Arrival date & time 04/27/14  2110 History   First MD Initiated Contact with Patient 04/27/14 2130     Chief Complaint  Patient presents with  . Drug Overdose     (Consider location/radiation/quality/duration/timing/severity/associated sxs/prior Treatment) HPI Comments: Patient here complaining of dizziness and in gait disturbance since taking 70 tablets of Tegretol yesterday. Denies that this was not a suicide attempt. Patient states he took the pills because his physician told him that he doesn't take enough of his medications. He takes Tegretol for his mood disorder. Denies ingesting any other medications. States he had trouble ambulating and that he sustained multiple abrasions to his upper extremities because of this. His mother called EMS today. No vomiting or fever noted. No command hallucinations.  Patient is a 43 y.o. male presenting with Overdose. The history is provided by the patient.  Drug Overdose    Past Medical History  Diagnosis Date  . Migraines   . Schizophrenia, schizo-affective   . Chronic back pain   . Knee pain, chronic    Past Surgical History  Procedure Laterality Date  . Knee surgery      four times   No family history on file. History  Substance Use Topics  . Smoking status: Current Every Day Smoker -- 3.00 packs/day for 10 years    Types: Cigarettes    Last Attempt to Quit: 02/26/2014  . Smokeless tobacco: Not on file  . Alcohol Use: No     Comment: occ    Review of Systems  All other systems reviewed and are negative.     Allergies  Nsaids and Quetiapine  Home Medications   Prior to Admission medications   Medication Sig Start Date End Date Taking? Authorizing Provider  acetaminophen (TYLENOL) 500 MG tablet Take 1,000 mg by mouth every 6 (six) hours as needed for mild pain.    Historical Provider, MD  ALPRAZolam [name]) 1 MG tablet  03/31/14   Historical Provider, MD  amantadine (SYMMETREL) 100 MG capsule Take 1  capsule (100 mg total) by mouth 2 (two) times daily. 03/06/14   Elmarie Shiley, NP  Aspirin-Acetaminophen (GOODYS BODY PAIN PO) Take 1 packet by mouth daily as needed. For pain    Historical Provider, MD  budesonide-formoterol (SYMBICORT) 160-4.5 MCG/ACT inhaler Inhale 2 puffs into the lungs 2 (two) times daily. 03/26/14   Harden Mo, MD  carbamazepine (TEGRETOL XR) 100 MG 12 hr tablet Take 3 tablets (300 mg total) by mouth 2 (two) times daily. 03/06/14   Elmarie Shiley, NP  cyclobenzaprine (FLEXERIL) 10 MG tablet Take 10 mg by mouth 3 (three) times daily as needed for muscle spasms.    Historical Provider, MD  dextromethorphan (DELSYM) 30 MG/5ML liquid Take 10 mLs (60 mg total) by mouth 2 (two) times daily. 03/26/14   Harden Mo, MD  doxepin (SINEQUAN) 150 MG capsule Take 1 capsule (150 mg total) by mouth at bedtime. 03/06/14   Elmarie Shiley, NP  HYDROcodone-acetaminophen (NORCO) 10-325 MG per tablet Take 1 tablet by mouth 4 (four) times daily as needed for moderate pain. For pain    Historical Provider, MD  hydrOXYzine (ATARAX/VISTARIL) 25 MG tablet Take 1 tablet (25 mg total) by mouth 3 (three) times daily. 03/06/14   Elmarie Shiley, NP  OLANZapine (ZYPREXA) 10 MG tablet  03/31/14   Historical Provider, MD  omeprazole (PRILOSEC) 40 MG capsule Take 1 capsule (40 mg total) by mouth 2 (two) times daily. 03/06/14  Elmarie Shiley, NP  ziprasidone (GEODON) 80 MG capsule Take 1 capsule (80 mg total) by mouth 2 (two) times daily with a meal. 03/06/14   Elmarie Shiley, NP   BP 148/97  Pulse 102  Temp(Src) 98.6 F (37 C) (Oral)  Resp 20  SpO2 97% Physical Exam  Nursing note and vitals reviewed. Constitutional: He is oriented to person, place, and time. He appears well-developed and well-nourished.  Non-toxic appearance. No distress.  HENT:  Head: Normocephalic and atraumatic.  Eyes: Conjunctivae, EOM and lids are normal. Pupils are equal, round, and reactive to light.  Neck: Normal range of motion. Neck supple. No  tracheal deviation present. No mass present.  Cardiovascular: Normal rate, regular rhythm and normal heart sounds.  Exam reveals no gallop.   No murmur heard. Pulmonary/Chest: Effort normal and breath sounds normal. No stridor. No respiratory distress. He has no decreased breath sounds. He has no wheezes. He has no rhonchi. He has no rales.  Abdominal: Soft. Normal appearance and bowel sounds are normal. He exhibits no distension. There is no tenderness. There is no rebound and no CVA tenderness.  Musculoskeletal: Normal range of motion. He exhibits no edema and no tenderness.  Multiple superficial abrasions noted to the patient's knees bilaterally as well as his upper extremities.  Neurological: He is alert and oriented to person, place, and time. He has normal strength. No cranial nerve deficit or sensory deficit. GCS eye subscore is 4. GCS verbal subscore is 5. GCS motor subscore is 6.  Horizontal nystagmus noted  Skin: Skin is warm and dry. No abrasion and no rash noted.  Psychiatric: His affect is blunt. His speech is delayed. He is slowed. He expresses no suicidal plans and no homicidal plans.    ED Course  Procedures (including critical care time) Labs Review Labs Reviewed  CARBAMAZEPINE LEVEL, TOTAL  ETHANOL  URINE RAPID DRUG SCREEN (HOSP PERFORMED)  SALICYLATE LEVEL  ACETAMINOPHEN LEVEL  CBC WITH DIFFERENTIAL  COMPREHENSIVE METABOLIC PANEL    Imaging Review No results found.   EKG Interpretation   Date/Time:  Thursday April 27 2014 22:15:12 EDT Ventricular Rate:  82 PR Interval:  138 QRS Duration: 99 QT Interval:  375 QTC Calculation: 438 R Axis:   90 Text Interpretation:  Sinus rhythm Borderline right axis deviation  Probable left ventricular hypertrophy No significant change since last  tracing Confirmed by Sayde Lish  MD, Jeymi Hepp ([id number]) on 04/27/2014 11:47:43 PM      MDM   Final diagnoses:  None     Patient given charcoal for suspected Tegretol overdose.  Patient states that this was not a suicide attempt. Spoke with patient's mother and she has taken out ivc paperwork pt will be admitted to [facility]   Leota Jacobsen, MD 04/27/14 1017  Leota Jacobsen, MD 04/27/14 [phone]

## 2014-04-27 NOTE — ED Notes (Signed)
Unable to void at this time.

## 2014-04-27 NOTE — ED Notes (Addendum)
Pt presents with c/o overdose last night. Pt reported to EMS that he took 60-70 100mg  tegretol last night. 0 pills left in the bottle but pt is prescribed 6 pills per day and bottle was filled on July 1st. Pt says he feels drunk. Per EMS, pt denies SI/HI. Pt reports he did this to get even with his doctor because she says he does not take enough tegretol. Pt has multiple abrasions on his legs and head from crawling around on the floor.

## 2014-04-27 NOTE — ED Notes (Signed)
Bed: WLPT2 Expected date:  Expected time:  Means of arrival:  Comments: EMS 

## 2014-04-28 ENCOUNTER — Inpatient Hospital Stay (HOSPITAL_COMMUNITY): Payer: PRIVATE HEALTH INSURANCE

## 2014-04-28 DIAGNOSIS — F411 Generalized anxiety disorder: Secondary | ICD-10-CM

## 2014-04-28 DIAGNOSIS — E876 Hypokalemia: Secondary | ICD-10-CM | POA: Diagnosis present

## 2014-04-28 DIAGNOSIS — T426X1A Poisoning by other antiepileptic and sedative-hypnotic drugs, accidental (unintentional), initial encounter: Principal | ICD-10-CM

## 2014-04-28 DIAGNOSIS — F259 Schizoaffective disorder, unspecified: Secondary | ICD-10-CM

## 2014-04-28 DIAGNOSIS — F1320 Sedative, hypnotic or anxiolytic dependence, uncomplicated: Secondary | ICD-10-CM

## 2014-04-28 LAB — COMPREHENSIVE METABOLIC PANEL
ALT: 28 U/L (ref 0–53)
AST: 24 U/L (ref 0–37)
Albumin: 3.5 g/dL (ref 3.5–5.2)
Alkaline Phosphatase: 62 U/L (ref 39–117)
Anion gap: 12 (ref 5–15)
BUN: 9 mg/dL (ref 6–23)
CO2: 26 meq/L (ref 19–32)
Calcium: 8.8 mg/dL (ref 8.4–10.5)
Chloride: 104 mEq/L (ref 96–112)
Creatinine, Ser: 0.84 mg/dL (ref 0.50–1.35)
GFR calc Af Amer: >90 mL/min (ref >90)
Glucose, Bld: 139 mg/dL — ABNORMAL HIGH (ref 70–99)
Potassium: 3.6 mEq/L — ABNORMAL LOW (ref 3.7–5.3)
SODIUM: 142 meq/L (ref 137–147)
TOTAL PROTEIN: 7.1 g/dL (ref 6.0–8.3)
Total Bilirubin: 0.3 mg/dL (ref 0.3–1.2)

## 2014-04-28 LAB — CBC
HCT: 45 % (ref 39.0–52.0)
HEMOGLOBIN: 15 g/dL (ref 13.0–17.0)
MCH: 29.6 pg (ref 26.0–34.0)
MCHC: 33.3 g/dL (ref 30.0–36.0)
MCV: 88.9 fL (ref 78.0–100.0)
PLATELETS: 216 10*3/uL (ref 150–400)
RBC: 5.06 MIL/uL (ref 4.22–5.81)
RDW: 13.5 % (ref 11.5–15.5)
WBC: 13.3 10*3/uL — ABNORMAL HIGH (ref 4.0–10.5)

## 2014-04-28 LAB — CARBAMAZEPINE LEVEL, TOTAL
CARBAMAZEPINE LVL: 22.4 ug/mL — AB (ref 4.0–12.0)
Carbamazepine Lvl: 16 ug/mL (ref 4.0–12.0)

## 2014-04-28 LAB — MRSA PCR SCREENING: MRSA by PCR: NEGATIVE

## 2014-04-28 MED ORDER — GUAIFENESIN-DM 100-10 MG/5ML PO SYRP: 5.0000 mL | ORAL_SOLUTION | ORAL | Status: DC | PRN

## 2014-04-28 MED ORDER — SODIUM CHLORIDE 0.9 % IV SOLN
INTRAVENOUS | Status: DC
  Administered 2014-04-28 (×2): via INTRAVENOUS

## 2014-04-28 MED ORDER — ONDANSETRON HCL 4 MG/2ML IJ SOLN: 4.0000 mg | Freq: Four times a day (QID) | INTRAMUSCULAR | Status: DC | PRN

## 2014-04-28 MED ORDER — BACITRACIN-NEOMYCIN-POLYMYXIN OINTMENT TUBE
TOPICAL_OINTMENT | Freq: Two times a day (BID) | CUTANEOUS | Status: DC
  Administered 2014-04-28: 20:00:00 via TOPICAL
  Administered 2014-04-28: 1 via TOPICAL
  Administered 2014-04-29 (×2): via TOPICAL
  Administered 2014-04-30: 1 via TOPICAL
  Filled 2014-04-28: qty 15

## 2014-04-28 MED ORDER — HEPARIN SODIUM (PORCINE) 5000 UNIT/ML IJ SOLN
5000.0000 [IU] | Freq: Three times a day (TID) | INTRAMUSCULAR | Status: DC
  Administered 2014-04-28 – 2014-04-29 (×7): 5000 [IU] via SUBCUTANEOUS
  Filled 2014-04-28 (×11): qty 1

## 2014-04-28 MED ORDER — ALUM & MAG HYDROXIDE-SIMETH 200-200-20 MG/5ML PO SUSP: 30.0000 mL | Freq: Four times a day (QID) | ORAL | Status: DC | PRN

## 2014-04-28 MED ORDER — POTASSIUM CHLORIDE IN NACL 20-0.9 MEQ/L-% IV SOLN
INTRAVENOUS | Status: DC
  Administered 2014-04-28 – 2014-04-30 (×6): via INTRAVENOUS
  Filled 2014-04-28 (×9): qty 1000

## 2014-04-28 MED ORDER — ALPRAZOLAM 1 MG PO TABS: 1.0000 mg | ORAL_TABLET | Freq: Three times a day (TID) | ORAL | Status: DC | PRN

## 2014-04-28 MED ORDER — PNEUMOCOCCAL VAC POLYVALENT 25 MCG/0.5ML IJ INJ
0.5000 mL | INJECTION | INTRAMUSCULAR | Status: AC
  Administered 2014-04-29: 0.5 mL via INTRAMUSCULAR
  Filled 2014-04-28 (×2): qty 0.5

## 2014-04-28 MED ORDER — ALBUTEROL SULFATE (2.5 MG/3ML) 0.083% IN NEBU: 2.5000 mg | INHALATION_SOLUTION | RESPIRATORY_TRACT | Status: DC | PRN

## 2014-04-28 MED ORDER — LORAZEPAM 2 MG/ML IJ SOLN: 1.0000 mg | Freq: Four times a day (QID) | INTRAMUSCULAR | Status: DC | PRN

## 2014-04-28 MED ORDER — ACETAMINOPHEN 325 MG PO TABS
650.0000 mg | ORAL_TABLET | Freq: Four times a day (QID) | ORAL | Status: DC | PRN
  Administered 2014-04-28 – 2014-04-30 (×4): 650 mg via ORAL
  Filled 2014-04-28 (×4): qty 2

## 2014-04-28 MED ORDER — ONDANSETRON HCL 4 MG PO TABS: 4.0000 mg | ORAL_TABLET | Freq: Four times a day (QID) | ORAL | Status: DC | PRN

## 2014-04-28 MED ORDER — PANTOPRAZOLE SODIUM 40 MG PO TBEC
40.0000 mg | DELAYED_RELEASE_TABLET | Freq: Every day | ORAL | Status: DC
  Administered 2014-04-28 – 2014-04-30 (×3): 40 mg via ORAL
  Filled 2014-04-28 (×3): qty 1

## 2014-04-28 MED ORDER — SODIUM CHLORIDE 0.9 % IJ SOLN
3.0000 mL | Freq: Two times a day (BID) | INTRAMUSCULAR | Status: DC
  Administered 2014-04-29: 3 mL via INTRAVENOUS

## 2014-04-28 MED ORDER — ZIPRASIDONE HCL 20 MG PO CAPS
20.0000 mg | ORAL_CAPSULE | Freq: Two times a day (BID) | ORAL | Status: DC
  Administered 2014-04-28 – 2014-04-29 (×2): 20 mg via ORAL
  Filled 2014-04-28 (×5): qty 1

## 2014-04-28 MED ORDER — ACETAMINOPHEN 650 MG RE SUPP: 650.0000 mg | Freq: Four times a day (QID) | RECTAL | Status: DC | PRN

## 2014-04-28 NOTE — Progress Notes (Signed)
Report called to Romie Minus for transfer to [place] , [place]

## 2014-04-28 NOTE — Progress Notes (Signed)
Spoke with Dr. Sloan Leiter about possibly placing NG to assist patient to consume charcoal medication.  At this time patient swallowed 1/2 dose and had hyperemesis soon afterwards.  Dr. Sloan Leiter stated to not place NG and no need to complete the remaining dose.  Pt resting comfortably.  Will continue to monitor.  Iantha Fallen RN 2:51 AM 04/28/2014

## 2014-04-28 NOTE — Consult Note (Signed)
[name] [name] Face-to-Face Psychiatry Consult   Reason for Consult:  Intentional overdose of Tegretol and psychosis Referring Physician:  Dr. Rama [name] [name] [name] is an 43 y.o. male. Total Time spent with [name]: 45 minutes  Assessment: AXIS [name]:  Schizoaffective Disorder AXIS II:  Deferred AXIS III:   Past Medical History  Diagnosis Date  . Migraines   . Schizophrenia, schizo-affective   . Chronic back pain   . Knee pain, chronic    AXIS IV:  other psychosocial or environmental problems, problems related to social environment and problems with primary support group AXIS V:  21-30 behavior considerably influenced by delusions or hallucinations OR serious impairment in judgment, communication OR inability to function in almost all areas  Plan:  Case discussed with Dr. Rockne Menghini and Wells Guiles, [facility] from [facility] Will start Geodon 20 mg BID for psychosis and agitation Monitor for Carbamazepine level and discontinue Tegretol Recommend psychiatric Inpatient admission when medically cleared. Supportive therapy provided about ongoing stressors. Appreciate psychiatric consultation and follow up as clinically required Please contact [phone] if needs further assistance  Subjective:   [name] [name] is a 43 y.o. male [name] admitted with suicidal attempt.  HPI: [name] is seen, chart reviewed, case discussed with his Staff RN. [name] stated that he has taken large dose of his medication Tegretol because he saw there are too many pills in his container and his psychiatrist informed him that he is not taking enough during past visit, so he has decided to take overdose. He felt drunk, could not walk, keep falling aroud his apartment and until his mother came and saw him and contacted EMS. [name] is having hard time to remember his providers and his medications. He is stated that he has been diagnosed with paranoia, has one episode of over dose and multiple acute psych hospital admission including [facility] and [facility]. [name] has been hearing auditory hallucinations, which are derogatory in nature but denied visual hallucinations and being paranoid. He reports living alone and being disabled. His wife and child has been living some where else. He denied family history of mental illness. He denied recent medication changes. He denied substance abuse and his last drink of alcohol about four months ago. He has been seeing psych at [facility] and his last admission to [facility] is June 2015. His CBZ level is 24.4 Ug/ML ( therapeutic level is 4-12)  Medical history: [name] [name] is a 43 y.o. male with a Past Medical History of schizophrenia, anxiety disorder, prior suicidal attempts who presents today with the above noted complaint. Per [name], approximately around 7 PM on 04/26/14 [name] to approximately 70 tablets of Tegretol. He apparently had seen his psychiatrist a few days back and was told that he was not taking his Tegretol. So to proove a point to his psychiatrist, he took approximately 70 tablets of Tegretol. Following which he should became very unsteady, and was unable to ambulate. He was essentially crawling around his apartment, and suffered numerous "carpet burns". He claims that he vomited numerous times during night. His family tried calling him, however since they got no response, they decided to check on him on 8/20. They found him on the floor, crawling, unable to ambulate or get up. There was evidence of vomitus all over his apartment. EMS was called, [name] was thought to [facility] for further evaluation and treatment. [name] [name].  During my evaluation, although slightly drowsy, he is awake and  alert. He denies that this overdose was a suicidal attempt. [name] denies any alcohol use, or any other polysubstance use  HPI Elements:   Location:  psychosis and suicidal attempt. Quality:  poor. Severity:  acute. Timing:  drug overdose  and non compliance with medications.  Past Psychiatric History: Past Medical History  Diagnosis Date  . Migraines   . Schizophrenia, schizo-affective   . Chronic back pain   . Knee pain, chronic     reports that he has been smoking Cigarettes.  He has a 30 pack-year smoking history. He does not have any smokeless tobacco history on file. He reports that he does not drink alcohol or use illicit drugs. No family history on file.   Living Arrangements: Alone   Abuse/Neglect [facility]) Physical Abuse: Denies Verbal Abuse: Denies Sexual Abuse: Denies Allergies:   Allergies  Allergen Reactions  . Nsaids Other (See Comments)    Rectal bleeding  . Quetiapine Other (See Comments)    Psychosis with high dose (663m)    ACT Assessment Complete:  NO Objective: Blood pressure 127/73, pulse 80, temperature 98.5 F (36.9 C), temperature source Oral, resp. rate 15, height 6' 1"  (1.854 m), weight 102.2 kg (225 lb 5 oz), SpO2 96.00%.Body mass index is 29.73 kg/(m^2). Results for orders placed during the hospital encounter of 04/27/14 (from the past 72 hour(s))  CARBAMAZEPINE LEVEL, TOTAL     Status: Abnormal   Collection Time    04/27/14 10:26 PM      Result Value Ref Range   Carbamazepine Lvl 22.4 (*) 4.0 - 12.0 ug/mL   Comment: RESULTS CONFIRMED BY MANUAL DILUTION     CRITICAL RESULT CALLED TO, READ BACK BY AND VERIFIED WITH:     JRudene Re08/21/15 0022 WAYK     [facility] ETHANOL     Status: None   Collection Time    04/27/14 10:26 PM      Result Value Ref Range   Alcohol, Ethyl (B) <11  0 - 11 mg/dL   Comment:            LOWEST DETECTABLE LIMIT FOR     SERUM ALCOHOL IS 11 mg/dL     FOR MEDICAL PURPOSES ONLY  SALICYLATE LEVEL     Status: Abnormal   Collection Time    04/27/14 10:26 PM      Result Value Ref Range   Salicylate Lvl <<5.3(*) 2.8 - 20.0 mg/dL  ACETAMINOPHEN LEVEL     Status: None   Collection Time    04/27/14 10:26 PM      Result Value Ref  Range   Acetaminophen (Tylenol), Serum <15.0  10 - 30 ug/mL   Comment:            THERAPEUTIC CONCENTRATIONS VARY     SIGNIFICANTLY. A RANGE OF 10-30     ug/mL MAY BE AN EFFECTIVE     CONCENTRATION FOR MANY PATIENTS.     HOWEVER, SOME ARE BEST TREATED     AT CONCENTRATIONS OUTSIDE THIS     RANGE.     ACETAMINOPHEN CONCENTRATIONS     >150 ug/mL AT 4 HOURS AFTER     INGESTION AND >50 ug/mL AT 12     HOURS AFTER INGESTION ARE     OFTEN ASSOCIATED WITH TOXIC     REACTIONS.  CBC WITH DIFFERENTIAL     Status: Abnormal   Collection Time    04/27/14 10:26 PM      Result  Value Ref Range   WBC 14.7 (*) 4.0 - 10.5 K/uL   RBC 5.48  4.22 - 5.81 MIL/uL   Hemoglobin 16.7  13.0 - 17.0 g/dL   HCT 47.6  39.0 - 52.0 %   MCV 86.9  78.0 - 100.0 fL   MCH 30.5  26.0 - 34.0 pg   MCHC 35.1  30.0 - 36.0 g/dL   RDW 13.4  11.5 - 15.5 %   Platelets 246  150 - 400 K/uL   Neutrophils Relative % 86 (*) 43 - 77 %   Neutro Abs 12.6 (*) 1.7 - 7.7 K/uL   Lymphocytes Relative 7 (*) 12 - 46 %   Lymphs Abs 1.1  0.7 - 4.0 K/uL   Monocytes Relative 7  3 - 12 %   Monocytes Absolute 1.0  0.1 - 1.0 K/uL   Eosinophils Relative 0  0 - 5 %   Eosinophils Absolute 0.0  0.0 - 0.7 K/uL   Basophils Relative 0  0 - 1 %   Basophils Absolute 0.0  0.0 - 0.1 K/uL  COMPREHENSIVE METABOLIC PANEL     Status: Abnormal   Collection Time    04/27/14 10:26 PM      Result Value Ref Range   Sodium 141  137 - 147 mEq/L   Potassium 4.2  3.7 - 5.3 mEq/L   Chloride 101  96 - 112 mEq/L   CO2 25  19 - 32 mEq/L   Glucose, Bld 153 (*) 70 - 99 mg/dL   BUN 10  6 - 23 mg/dL   Creatinine, Ser 0.84  0.50 - 1.35 mg/dL   Calcium 10.2  8.4 - 10.5 mg/dL   Total Protein 8.3  6.0 - 8.3 g/dL   Albumin 4.4  3.5 - 5.2 g/dL   AST 26  0 - 37 U/L   ALT 35  0 - 53 U/L   Alkaline Phosphatase 76  39 - 117 U/L   Total Bilirubin 0.4  0.3 - 1.2 mg/dL   GFR calc non Af Amer >90  >90 mL/min   GFR calc Af Amer >90  >90 mL/min   Comment: (NOTE)     The  eGFR has been calculated using the CKD EPI equation.     This calculation has not been validated in all clinical situations.     eGFR's persistently <90 mL/min signify possible Chronic Kidney     Disease.   Anion gap 15  5 - 15  URINE RAPID DRUG SCREEN (HOSP PERFORMED)     Status: Abnormal   Collection Time    04/27/14 11:37 PM      Result Value Ref Range   Opiates NONE DETECTED  NONE DETECTED   Cocaine NONE DETECTED  NONE DETECTED   Benzodiazepines POSITIVE (*) NONE DETECTED   Amphetamines NONE DETECTED  NONE DETECTED   Tetrahydrocannabinol NONE DETECTED  NONE DETECTED   Barbiturates NONE DETECTED  NONE DETECTED   Comment:            DRUG SCREEN FOR MEDICAL PURPOSES     ONLY.  IF CONFIRMATION IS NEEDED     FOR ANY PURPOSE, NOTIFY LAB     WITHIN 5 DAYS.                LOWEST DETECTABLE LIMITS     FOR URINE DRUG SCREEN     Drug Class       Cutoff (ng/mL)     Amphetamine      1000  Barbiturate      200     Benzodiazepine   188     Tricyclics       416     Opiates          300     Cocaine          300     THC              50  MRSA PCR SCREENING     Status: None   Collection Time    04/28/14  1:30 AM      Result Value Ref Range   MRSA by PCR NEGATIVE  NEGATIVE   Comment:            The GeneXpert MRSA Assay (FDA     approved for NASAL specimens     only), is one component of a     comprehensive MRSA colonization     surveillance program. It is not     intended to diagnose MRSA     infection nor to guide or     monitor treatment for     MRSA infections.  CBC     Status: Abnormal   Collection Time    04/28/14  3:55 AM      Result Value Ref Range   WBC 13.3 (*) 4.0 - 10.5 K/uL   RBC 5.06  4.22 - 5.81 MIL/uL   Hemoglobin 15.0  13.0 - 17.0 g/dL   HCT 45.0  39.0 - 52.0 %   MCV 88.9  78.0 - 100.0 fL   MCH 29.6  26.0 - 34.0 pg   MCHC 33.3  30.0 - 36.0 g/dL   RDW 13.5  11.5 - 15.5 %   Platelets 216  150 - 400 K/uL  COMPREHENSIVE METABOLIC PANEL     Status: Abnormal    Collection Time    04/28/14  3:55 AM      Result Value Ref Range   Sodium 142  137 - 147 mEq/L   Potassium 3.6 (*) 3.7 - 5.3 mEq/L   Chloride 104  96 - 112 mEq/L   CO2 26  19 - 32 mEq/L   Glucose, Bld 139 (*) 70 - 99 mg/dL   BUN 9  6 - 23 mg/dL   Creatinine, Ser 0.84  0.50 - 1.35 mg/dL   Calcium 8.8  8.4 - 10.5 mg/dL   Total Protein 7.1  6.0 - 8.3 g/dL   Albumin 3.5  3.5 - 5.2 g/dL   AST 24  0 - 37 U/L   ALT 28  0 - 53 U/L   Alkaline Phosphatase 62  39 - 117 U/L   Total Bilirubin 0.3  0.3 - 1.2 mg/dL   GFR calc non Af Amer >90  >90 mL/min   GFR calc Af Amer >90  >90 mL/min   Comment: (NOTE)     The eGFR has been calculated using the CKD EPI equation.     This calculation has not been validated in all clinical situations.     eGFR's persistently <90 mL/min signify possible Chronic Kidney     Disease.   Anion gap 12  5 - 15   Labs are reviewed and are pertinent for CBZ 22.4 and benzo, elevated WBC.  Current Facility-Administered Medications  Medication Dose Route Frequency Provider Last Rate Last Dose  . 0.9 % NaCl with KCl 20 mEq/ L  infusion   Intravenous Continuous Venetia Maxon Rama, MD 125 mL/hr at 04/28/14  53    . acetaminophen (TYLENOL) tablet 650 mg  650 mg Oral Q6H PRN Shanker Kristeen Mans, MD       Or  . acetaminophen (TYLENOL) suppository 650 mg  650 mg Rectal Q6H PRN Shanker Kristeen Mans, MD      . albuterol (PROVENTIL) (2.5 MG/3ML) 0.083% nebulizer solution 2.5 mg  2.5 mg Nebulization Q2H PRN Shanker Kristeen Mans, MD      . ALPRAZolam [name]) tablet 1 mg  1 mg Oral TID PRN Jonetta Osgood, MD      . alum & mag hydroxide-simeth (MAALOX/MYLANTA) 200-200-20 MG/5ML suspension 30 mL  30 mL Oral Q6H PRN Shanker Kristeen Mans, MD      . guaiFENesin-dextromethorphan (ROBITUSSIN DM) 100-10 MG/5ML syrup 5 mL  5 mL Oral Q4H PRN Shanker Kristeen Mans, MD      . heparin injection 5,000 Units  5,000 Units Subcutaneous 3 times per day Jonetta Osgood, MD   5,000 Units at 04/28/14 8088  .  LORazepam (ATIVAN) injection 1 mg  1 mg Intravenous Q6H PRN Shanker Kristeen Mans, MD      . ondansetron (ZOFRAN) tablet 4 mg  4 mg Oral Q6H PRN Shanker Kristeen Mans, MD       Or  . ondansetron (ZOFRAN) injection 4 mg  4 mg Intravenous Q6H PRN Shanker Kristeen Mans, MD      . pantoprazole (PROTONIX) EC tablet 40 mg  40 mg Oral Daily Shanker Kristeen Mans, MD      . Derrill Memo ON 04/29/2014] pneumococcal 23 valent vaccine (PNU-IMMUNE) injection 0.5 mL  0.5 mL Intramuscular Tomorrow-1000 Shanker Kristeen Mans, MD      . sodium chloride 0.9 % injection 3 mL  3 mL Intravenous Q12H Shanker Kristeen Mans, MD        Psychiatric Specialty Exam: Physical Exam as per history and physical  Review of [name]  Psychiatric/Behavioral: Positive for depression, suicidal ideas, hallucinations and memory loss. The [name] is nervous/anxious.     Blood pressure 127/73, pulse 80, temperature 98.5 F (36.9 C), temperature source Oral, resp. rate 15, height 6' 1"  (1.854 m), weight 102.2 kg (225 lb 5 oz), SpO2 96.00%.Body mass index is 29.73 kg/(m^2).  General Appearance: Bizarre and Guarded  Eye Contact::  Fair  Speech:  Clear and Coherent and Slow  Volume:  Decreased  Mood:  Anxious, Depressed and Hopeless  Affect:  Depressed  Thought Process:  Coherent and Goal Directed  Orientation:  Full (Time, Place, and Person)  Thought Content:  Hallucinations: Auditory  Suicidal Thoughts:  No  Homicidal Thoughts:  No  Memory:  Immediate;   Fair Recent;   Fair  Judgement:  Impaired  Insight:  Lacking  Psychomotor Activity:  Restlessness  Concentration:  Fair  Recall:  [place]: Fair  Akathisia:  NA  Handed:  Right  AIMS (if indicated):     Assets:  Communication Skills Desire for Improvement Housing Leisure Time Resilience Social Support Talents/Skills  Sleep:      Musculoskeletal: Strength & Muscle Tone: decreased Gait & Station: unable to stand [name] leans: N/A  Treatment Plan  Summary: Daily contact with [name] to assess and evaluate symptoms and progress in treatment Medication management [name] meets criteria for acute psych admission as he is psychotic and dangerous by taking toxic doses of medications.  [name] [name],[name] R. 04/28/2014 9:20 AM

## 2014-04-28 NOTE — Consult Note (Addendum)
WOC wound consult note Reason for Consult: Consult requested for multiple locations of rug burn.  Pt states he crawled across the carpet for an extended period of time. Wound type: All locations are partial thickness, dry, dark red, slightly scabbed.  No odor or drainage.   Pressure Ulcer POA: These are NOT pressure ulcers, but are R/T friction. Measurement: Forehead 6X6X.1cm, 3X3X.1cm Bridge of nose 2X1X.1cm Right elbow 2X1X.1cm Left elbow 1X1X.1cm Left knee 4X4X.1cm and 5X5X.1cm Right knee 2X2X.1cm and 1X1X.1cm Dressing procedure/placement/frequency: Neosporin to promote moist healing to all affected areas. Foam dressing to bilat knees to protect from further injury of clothing sticking to the woundbed.  Discussed plan of care with patient and he verbalizes understanding. Please re-consult if further assistance is needed.  Thank-you,  Julien Girt MSN, [place], [place], [place], [place]

## 2014-04-28 NOTE — Progress Notes (Signed)
Chaplain received referral from Social Work.  Pt requesting to complete advance directive.   In consultation with TTS and AC at [place] is unable to complete advance directive.  Pt is IVC and not legally competent at this time.  Will provide support with pt as he is admitted to [facility] and if transferred to [facility].  Will reassess for advance directive when pt is legally competent.     [place], [place]

## 2014-04-28 NOTE — Progress Notes (Signed)
Progress Note   [name] QQV:[id number] DOB: 06-26-1971 DOA: 04/27/2014 PCP: Woody Seller, MD   Brief Narrative:   [name] is an 43 y.o. male with a PMH of schizophrenia, anxiety disorder with benzodiazepine dependence, prior suicidal attempts who was admitted 04/27/14 with an intentional drug overdose of 60-70 tablets of 100 mg extended-release Tegretol.   Assessment/Plan:   Principal Problem:   Tegretol drug overdose  Monitored in the SDU overnight and provided with supportive care.  Status post half of the dose of activated charcoal.  Initial Tegretol level 22.4, followup Tegretol level pending.  UDS positive for benzodiazepines only. Ethanol less than 11. Salicylate less than 2. Acetaminophen less than 15.  Continue Air cabin crew.  Psychiatry consultation requested.  Active Problems:   Hypokalemia  Add potassium to IV fluids.    Schizoaffective disorder, bipolar type  Symmetrel, Tegretol, Sinequan, Zyprexa and Geodon all on hold.    Anxiety state, unspecified / Benzodiazepine dependence  Continue Xanax 1 mg 3 times a day as needed.    DVT Prophylaxis  Continue subcutaneous heparin.  Code Status: Full. Family Communication: No family currently at the bedside. Reports sister is aware of his admission to the hospital. Disposition Plan: Home versus inpatient psychiatric hospital for psychiatric stabilization when stable.   IV Access:    Peripheral IV   Procedures and diagnostic studies:    Dg Chest 2 View 04/28/2014: No acute cardiopulmonary findings.     Ct Head Wo Contrast 04/28/2014: No acute intracranial findings.     Medical Consultants:    Psychiatry   Other Consultants:    None.   Anti-Infectives:    None.  Subjective:   [name] tells me that he does not know why he took an overdose of his Depakote, but that he was angry at his psychiatrist who was telling him he was not taking enough of this  medication. Denies that he was trying to kill himself. No current complaints of vomiting, but does have some nausea. Tolerates diet. No dyspnea. No chest pain.  Objective:    Filed Vitals:   04/28/14 0355 04/28/14 0400 04/28/14 0428 04/28/14 0600  BP: 154/105 147/96 144/71 127/73  Pulse: 82 77 76 80  Temp:   98 F (36.7 C)   TempSrc:   Oral   Resp: 16 15 15    Height:      Weight:      SpO2: 97% 94% 95% 96%    Intake/Output Summary (Last 24 hours) at 04/28/14 0737 Last data filed at 04/28/14 0655  Gross per 24 hour  Intake 1225.41 ml  Output      0 ml  Net 1225.41 ml    Exam: Gen:  NAD Psych: Affect normal. Poor judgment and insight. Cardiovascular:  RRR, No M/R/G Respiratory:  Lungs CTAB Gastrointestinal:  Abdomen soft, NT/ND, + BS Extremities:  No C/E/C   Data Reviewed:    Labs: Basic Metabolic Panel:  Recent Labs Lab 04/27/14 2226 04/28/14 0355  NA 141 142  K 4.2 3.6*  CL 101 104  CO2 25 26  GLUCOSE 153* 139*  BUN 10 9  CREATININE 0.84 0.84  CALCIUM 10.2 8.8   GFR Estimated Creatinine Clearance: 141 ml/min (by C-G formula based on Cr of 0.84). Liver Function Tests:  Recent Labs Lab 04/27/14 2226 04/28/14 0355  AST 26 24  ALT 35 28  ALKPHOS 76 62  BILITOT 0.4 0.3  PROT 8.3 7.1  ALBUMIN 4.4 3.5  CBC:  Recent Labs Lab 04/27/14 2226 04/28/14 0355  WBC 14.7* 13.3*  NEUTROABS 12.6*  --   HGB 16.7 15.0  HCT 47.6 45.0  MCV 86.9 88.9  PLT 246 216   BNP (last 3 results)  Recent Labs  10/11/13 0101  PROBNP 21.9   Microbiology Recent Results (from the past 240 hour(s))  MRSA PCR SCREENING     Status: None   Collection Time    04/28/14  1:30 AM      Result Value Ref Range Status   MRSA by PCR NEGATIVE  NEGATIVE Final   Comment:            The GeneXpert MRSA Assay (FDA     approved for NASAL specimens     only), is one component of a     comprehensive MRSA colonization     surveillance program. It is not     intended to  diagnose MRSA     infection nor to guide or     monitor treatment for     MRSA infections.     Medications:   . heparin  5,000 Units Subcutaneous 3 times per day  . pantoprazole  40 mg Oral Daily  . [START ON 04/29/2014] pneumococcal 23 valent vaccine  0.5 mL Intramuscular Tomorrow-1000  . sodium chloride  3 mL Intravenous Q12H   Continuous Infusions: . sodium chloride 125 mL/hr at 04/28/14 0451    Time spent: 30 minutes.   LOS: 1 day   RAMA,CHRISTINA  [facility] Pager [phone]. If unable to reach me by pager, please call my cell phone at [phone].  *Please refer to [email or web address], password [email or web address] to get updated schedule on who will round on this patient, as hospitalists switch teams weekly. If 7PM-7AM, please contact night-coverage at [email or web address], password [email or web address] for any overnight needs.  04/28/2014, 7:37 AM

## 2014-04-28 NOTE — ED Notes (Signed)
Tegretol 22.4 critical called from [facility] lab.

## 2014-04-28 NOTE — H&P (Signed)
PATIENT DETAILS Name: [name] Age: 43 y.o. Sex: male Date of Birth: 04/11/1971 Admit Date: 04/27/2014 MWU:[id number],NUUV Mallie Mussel, MD   CHIEF COMPLAINT:  Drug overdose-60-70 tablets of 100 mg of extended release Tegretol around 7 PM on 04/26/14  HPI: [name] is a 43 y.o. male with a Past Medical History of schizophrenia, anxiety disorder, prior suicidal attempts who presents today with the above noted complaint. Per patient, approximately around 7 PM on 04/26/14 patient to approximately 70 tablets of Tegretol. He apparently had seen his psychiatrist a few days back and was told that he was not taking his Tegretol. So to proove a point to his  psychiatrist, he took approximately 70 tablets of Tegretol. Following which he should became very unsteady, and was unable to ambulate. He was essentially crawling around his apartment, and suffered numerous "carpet burns". He claims that he vomited numerous times during night. His family tried calling him, however since they got no response, they decided to check on him on 8/20. They found him on the floor, crawling, unable to ambulate or get up. There was evidence of vomitus all over his apartment. EMS was called, patient was thought to [facility] for further evaluation and treatment. I was then asked to admit this patient. During my evaluation, although slightly drowsy, he is awake and alert. He denies that this overdose was a suicidal attempt. Patient denies any alcohol use, or any other polysubstance use.  ALLERGIES:   Allergies  Allergen Reactions  . Nsaids Other (See Comments)    Rectal bleeding  . Quetiapine Other (See Comments)    Psychosis with high dose (600mg )    PAST MEDICAL HISTORY: Past Medical History  Diagnosis Date  . Migraines   . Schizophrenia, schizo-affective   . Chronic back pain   . Knee pain, chronic     PAST SURGICAL HISTORY: Past Surgical History  Procedure Laterality Date  . Knee  surgery      four times    MEDICATIONS AT HOME: Prior to Admission medications   Medication Sig Start Date End Date Taking? Authorizing Provider  acetaminophen (TYLENOL) 500 MG tablet Take 1,000 mg by mouth every 6 (six) hours as needed for mild pain.   Yes Historical Provider, MD  ALPRAZolam [name]) 1 MG tablet Take 1 mg by mouth 3 (three) times daily as needed for anxiety.  03/31/14  Yes Historical Provider, MD  amantadine (SYMMETREL) 100 MG capsule Take 1 capsule (100 mg total) by mouth 2 (two) times daily. 03/06/14  Yes Elmarie Shiley, NP  carbamazepine (TEGRETOL XR) 100 MG 12 hr tablet Take 3 tablets (300 mg total) by mouth 2 (two) times daily. 03/06/14  Yes Elmarie Shiley, NP  doxepin (SINEQUAN) 150 MG capsule Take 1 capsule (150 mg total) by mouth at bedtime. 03/06/14  Yes Elmarie Shiley, NP  HYDROcodone-acetaminophen (NORCO) 10-325 MG per tablet Take 1 tablet by mouth 4 (four) times daily as needed for moderate pain. For pain   Yes Historical Provider, MD  hydrOXYzine (ATARAX/VISTARIL) 25 MG tablet Take 1 tablet (25 mg total) by mouth 3 (three) times daily. 03/06/14  Yes Elmarie Shiley, NP  OLANZapine (ZYPREXA) 10 MG tablet Take 10 mg by mouth at bedtime.  03/31/14  Yes Historical Provider, MD  omeprazole (PRILOSEC) 40 MG capsule Take 1 capsule (40 mg total) by mouth 2 (two) times daily. 03/06/14  Yes Elmarie Shiley, NP  ziprasidone (GEODON) 80 MG capsule Take 1 capsule (80 mg  total) by mouth 2 (two) times daily with a meal. 03/06/14  Yes Elmarie Shiley, NP    FAMILY HISTORY: No family history of CAD  SOCIAL HISTORY:  reports that he has been smoking Cigarettes.  He has a 30 pack-year smoking history. He does not have any smokeless tobacco history on file. He reports that he does not drink alcohol or use illicit drugs.  REVIEW OF SYSTEMS:  Constitutional:   No  weight loss, night sweats,  Fevers, chills, fatigue.  HEENT:    No headaches, Difficulty swallowing,Tooth/dental problems,Sore throat,  No  sneezing, itching, ear ache, nasal congestion, post nasal drip,   Cardio-vascular: No chest pain,  Orthopnea, PND, swelling in lower extremities, anasarca,         dizziness, palpitations  GI:  No heartburn, indigestion, abdominal pain, nausea, vomiting, diarrhea, change in       bowel habits, loss of appetite  Resp: No shortness of breath with exertion or at rest.  No excess mucus, no productive cough, No non-productive cough,  No coughing up of blood.No change in color of mucus.No wheezing.No chest wall deformity  Skin:  no rash or lesions.  GU:  no dysuria, change in color of urine, no urgency or frequency.  No flank pain.  Musculoskeletal: No joint pain or swelling.  No decreased range of motion.  No back pain.  Psych: No change in mood or affect. No depression or anxiety.  No memory loss.   PHYSICAL EXAM: Blood pressure 158/100, pulse 95, temperature 98.6 F (37 C), temperature source Oral, resp. rate 18, SpO2 96.00%.  General appearance :Awake, alert, not in any distress. Speech Clear. Not toxic Looking HEENT: Atraumatic and Normocephalic, pupils equally reactive to light and accomodation. + horizontal and vertical nystagmus Neck: supple, no JVD. No cervical lymphadenopathy.  Chest:Good air entry bilaterally, no added sounds  CVS: S1 S2 regular, no murmurs.  Abdomen: Bowel sounds present, Non tender and not distended with no gaurding, rigidity or rebound. Extremities: B/L Lower Ext shows no edema, both legs are warm to touch Neurology: Awake alert, and oriented X 3, impaired coordination-+ dysdiadokinesis and impaired knee to lower leg coordination. Skin:No Rash Wounds:N/A  LABS ON ADMISSION:   Recent Labs  04/27/14 2226  NA 141  K 4.2  CL 101  CO2 25  GLUCOSE 153*  BUN 10  CREATININE 0.84  CALCIUM 10.2    Recent Labs  04/27/14 2226  AST 26  ALT 35  ALKPHOS 76  BILITOT 0.4  PROT 8.3  ALBUMIN 4.4   No results found for this basename: LIPASE,  AMYLASE,  in the last 72 hours  Recent Labs  04/27/14 2226  WBC 14.7*  NEUTROABS 12.6*  HGB 16.7  HCT 47.6  MCV 86.9  PLT 246   No results found for this basename: CKTOTAL, CKMB, CKMBINDEX, TROPONINI,  in the last 72 hours No results found for this basename: DDIMER,  in the last 72 hours No components found with this basename: POCBNP,    RADIOLOGIC STUDIES ON ADMISSION: No results found.   EKG: Independently reviewed. NSR  ASSESSMENT AND PLAN: Present on Admission:  . Drug overdose with Tegretol - Has some neurological signs, per patient and to overdose occurred around 7 PM on 8/19. Not tachycardic, EKG shows a preserved QT interval/QRS. Spoke with poison control Oris Drone), apart from supportive care no specific recommendations.Advised to give charcoal as patient awake/alert and at low risk of aspiration-however patient refusing at this time. They also advised to get  a Tegretol level on a daily basis given the patient was asymptomatic. Would recheck EKG in a.m. Will monitor in step down unit for now.  . Benzodiazepine dependence - Continue with Xanax  . Schizoaffective disorder, bipolar type. - Hold Geodon, Zyprexa and his other bipolar/schizoaffective medications for now.   . Anxiety state, unspecified - Continue with benzodiazepines.  Further plan will depend as patient's clinical course evolves and further radiologic and laboratory data become available. Patient will be monitored closely.  Above noted plan was discussed with patient/mother, they were in agreement.   DVT Prophylaxis: Prophylactic Heparin  Code Status: Full Code  Total time spent for admission equals 45 minutes.  [facility] Pager [phone]  If 7PM-7AM, please contact night-coverage [email or web address] Password [email or web address] 04/28/2014, 12:08 AM  **Disclaimer: This note may have been dictated with voice recognition software. Similar sounding words can inadvertently be transcribed  and this note may contain transcription errors which may not have been corrected upon publication of note.**

## 2014-04-28 NOTE — Progress Notes (Signed)
Discussed with pt about his history of smoking 3 packs of cigarettes per day.  He stated he quit when he entered the hospital tonight.  RN suggested he would benefit from a nicotine patch to help with cravings.  Pt stated he did not want a nicotine patch and nicotine actually makes him feel sick to his stomach.  No other concerns at this time.  Will continue to monitor.  Iantha Fallen RN 0200 04/28/2014

## 2014-04-28 NOTE — Care Management Note (Signed)
    Page 1 of 1   04/28/2014     12:47:19 PM CARE MANAGEMENT NOTE 04/28/2014  Patient:  [name], [name]   Account Number:  [id number]  Date Initiated:  04/28/2014  Documentation initiated by:  Dessa Phi  Subjective/Objective Assessment:   43 Y/O M ADMITTED W/DRUG OD.     Action/Plan:   FROM HOME.   Anticipated DC Date:  04/30/2014   Anticipated DC Plan:  [place]  CM consult      Choice offered to / List presented to:             Status of service:  In process, will continue to follow Medicare Important Message given?   (If response is "NO", the following Medicare IM given date fields will be blank) Date Medicare IM given:   Medicare IM given by:   Date Additional Medicare IM given:   Additional Medicare IM given by:    Discharge Disposition:    Per UR Regulation:  Reviewed for med. necessity/level of care/duration of stay  If discussed at [place] of Stay Meetings, dates discussed:    Comments:  04/28/14 Sianna Garofano RN,BSN NCM [place].

## 2014-04-28 NOTE — Progress Notes (Signed)
CRITICAL VALUE ALERT  Critical value received:  Tegretol 16.0  Date of notification:  04-28-14  Time of notification:  1609  Critical value read back:Yes.    Nurse who received alert:  Laural Benes, RN  MD notified (1st page):  Dr. Rockne Menghini  Time of first page:  1611  MD notified (2nd page):  Time of second page:  Responding MD:  Dr. Rockne Menghini  Time MD responded:  [phone]

## 2014-04-28 NOTE — Progress Notes (Signed)
CSW discussed case with RN Maudie Mercury. Per record review, pt is appropriate for inpatient psychiatric treatment. CSW will fax out when pt is medically stable.  Rochele Pages,     ED CSW  phone: [phone]

## 2014-04-29 DIAGNOSIS — T426X1A Poisoning by other antiepileptic and sedative-hypnotic drugs, accidental (unintentional), initial encounter: Secondary | ICD-10-CM | POA: Diagnosis not present

## 2014-04-29 DIAGNOSIS — E876 Hypokalemia: Secondary | ICD-10-CM

## 2014-04-29 LAB — COMPREHENSIVE METABOLIC PANEL
ALBUMIN: 3.4 g/dL — AB (ref 3.5–5.2)
ALT: 23 U/L (ref 0–53)
AST: 26 U/L (ref 0–37)
Alkaline Phosphatase: 59 U/L (ref 39–117)
Anion gap: 12 (ref 5–15)
BUN: 6 mg/dL (ref 6–23)
CO2: 23 mEq/L (ref 19–32)
CREATININE: 0.79 mg/dL (ref 0.50–1.35)
Calcium: 8.6 mg/dL (ref 8.4–10.5)
Chloride: 106 mEq/L (ref 96–112)
GFR calc Af Amer: >90 mL/min (ref >90)
Glucose, Bld: 89 mg/dL (ref 70–99)
Potassium: 3.9 mEq/L (ref 3.7–5.3)
Sodium: 141 mEq/L (ref 137–147)
TOTAL PROTEIN: 6.6 g/dL (ref 6.0–8.3)
Total Bilirubin: 0.3 mg/dL (ref 0.3–1.2)

## 2014-04-29 LAB — CBC
HCT: 42.8 % (ref 39.0–52.0)
Hemoglobin: 14.2 g/dL (ref 13.0–17.0)
MCH: 29.4 pg (ref 26.0–34.0)
MCHC: 33.2 g/dL (ref 30.0–36.0)
MCV: 88.6 fL (ref 78.0–100.0)
PLATELETS: 189 10*3/uL (ref 150–400)
RBC: 4.83 MIL/uL (ref 4.22–5.81)
RDW: 13.2 % (ref 11.5–15.5)
WBC: 8.5 10*3/uL (ref 4.0–10.5)

## 2014-04-29 LAB — CARBAMAZEPINE LEVEL, TOTAL: CARBAMAZEPINE LVL: 11.7 ug/mL (ref 4.0–12.0)

## 2014-04-29 MED ORDER — ZIPRASIDONE HCL 40 MG PO CAPS
40.0000 mg | ORAL_CAPSULE | Freq: Two times a day (BID) | ORAL | Status: DC
Start: 2014-04-29 — End: 2014-04-30
  Administered 2014-04-29 – 2014-04-30 (×2): 40 mg via ORAL
  Filled 2014-04-29 (×4): qty 1

## 2014-04-29 NOTE — Consult Note (Signed)
[name] Face-to-Face Psychiatry Consult   Reason for Consult:  Intentional overdose of Tegretol and psychosis Referring Physician:  Dr. Rama [name]rose [name] is an 43 y.o. male. Total Time spent with patient: 45 minutes  Assessment: AXIS I:  Schizoaffective Disorder AXIS II:  Deferred AXIS III:   Past Medical History  Diagnosis Date  . Migraines   . Schizophrenia, schizo-affective   . Chronic back pain   . Knee pain, chronic    AXIS IV:  other psychosocial or environmental problems, problems related to social environment and problems with primary support group AXIS V:  21-30 behavior considerably influenced by delusions or hallucinations OR serious impairment in judgment, communication OR inability to function in almost all areas  Plan:  Case discussed with Dr. Rockne [name] and [name], [name] from [name] Will start Geodon 20 mg BID for psychosis and agitation Monitor for Carbamazepine level and discontinue Tegretol Recommend psychiatric Inpatient admission when medically cleared. Supportive therapy provided about ongoing stressors. Appreciate psychiatric consultation and follow up as clinically required Please contact [phone] if needs further assistance  Subjective:   [name] is a 43 y.o. male patient admitted with suicidal attempt.  HPI: Patient is seen, chart reviewed, case discussed with his Staff RN. Patient stated that he has taken large dose of his medication Tegretol because he saw there are too many pills in his container and his psychiatrist informed him that he is not taking enough during past visit, so he has decided to take overdose. He felt drunk, could not walk, keep falling aroud his apartment and until his mother came and saw him and contacted EMS. Patient is having hard time to remember his providers and his medications. He is stated that he has been diagnosed with paranoia, has one episode of over dose and multiple acute psych [name] admission [name]. Patient has been hearing auditory hallucinations, which are derogatory in nature but denied visual hallucinations and being paranoid. He reports living alone and being disabled. His wife and child has been living some where else. He denied family history of mental illness. He denied recent medication changes. He denied substance abuse and his last drink of alcohol about four months ago. He has been seeing psych at [name] and his last admission to [name] is June 2015. His CBZ level is 24.4 Ug/ML ( therapeutic level is 4-12)  Patient seen today 04/29/14. He claims he took the overdose of Tegretol to get even with his psychiatrist. "She is always accusing me of not taking my meds." He is alert and oriented today and denies suicidal ideation.tegretol level is down to 11. He denies auditory and visual hallucinations or paranoia. I have attempted to call mother, but no answer.  Medical history: [name] is a 43 y.o. male with a Past Medical History of schizophrenia, anxiety disorder, prior suicidal attempts who presents today with the above noted complaint. Per patient, approximately around 7 PM on 04/26/14 patient to approximately 70 tablets of Tegretol. He apparently had seen his psychiatrist a few days back and was told that he was not taking his Tegretol. So to proove a point to his psychiatrist, he took approximately 70 tablets of Tegretol. Following which he should became very unsteady, and was unable to ambulate. He was essentially crawling around his apartment, and suffered numerous "carpet burns". He claims that he vomited numerous times during night. His family tried calling him, however since they got no response, they decided to  check on him on 8/20. They found him on the floor, crawling, unable to ambulate or get up. There was evidence of vomitus all over his apartment. EMS was called, patient was thought to [name] for further evaluation and  treatment. I was then asked to admit this patient.  During my evaluation, although slightly drowsy, he is awake and alert. He denies that this overdose was a suicidal attempt. Patient denies any alcohol use, or any other polysubstance use  HPI Elements:   Location:  psychosis and suicidal attempt. Quality:  poor. Severity:  acute. Timing:  drug overdose and non compliance with medications.  Past Psychiatric History: Past Medical History  Diagnosis Date  . Migraines   . Schizophrenia, schizo-affective   . Chronic back pain   . Knee pain, chronic     reports that he has been smoking Cigarettes.  He has a 30 pack-year smoking history. He does not have any smokeless tobacco history on file. He reports that he does not drink alcohol or use illicit drugs. No family history on file.   Living Arrangements: Alone   Abuse/Neglect [name]) Physical Abuse: Denies Verbal Abuse: Denies Sexual Abuse: Denies Allergies:   Allergies  Allergen Reactions  . Nsaids Other (See Comments)    Rectal bleeding  . Quetiapine Other (See Comments)    Psychosis with high dose (663m)    ACT Assessment Complete:  NO Objective: Blood pressure 162/85, pulse 69, temperature 98.8 F (37.1 C), temperature source Oral, resp. rate 20, height 6' 1"  (1.854 m), weight 225 lb (102.059 kg), SpO2 100.00%.Body mass index is 29.69 kg/(m^2). Results for orders placed during the [name] encounter of 04/27/14 (from the past 72 hour(s))  CARBAMAZEPINE LEVEL, TOTAL     Status: Abnormal   Collection Time    04/27/14 10:26 PM      Result Value Ref Range   Carbamazepine Lvl 22.4 (*) 4.0 - 12.0 ug/mL   Comment: RESULTS CONFIRMED BY MANUAL DILUTION     CRITICAL RESULT CALLED TO, READ BACK BY AND VERIFIED WITH:     [name]     Performed at [name] ETHANOL     Status: None   Collection Time    04/27/14 10:26 PM      Result Value Ref Range   Alcohol, Ethyl (B) <11  0 - 11 mg/dL    Comment:            LOWEST DETECTABLE LIMIT FOR     SERUM ALCOHOL IS 11 mg/dL     FOR MEDICAL PURPOSES ONLY  SALICYLATE LEVEL     Status: Abnormal   Collection Time    04/27/14 10:26 PM      Result Value Ref Range   Salicylate Lvl <<7.2(*) 2.8 - 20.0 mg/dL  ACETAMINOPHEN LEVEL     Status: None   Collection Time    04/27/14 10:26 PM      Result Value Ref Range   Acetaminophen (Tylenol), Serum <15.0  10 - 30 ug/mL   Comment:            THERAPEUTIC CONCENTRATIONS VARY     SIGNIFICANTLY. A RANGE OF 10-30     ug/mL MAY BE AN EFFECTIVE     CONCENTRATION FOR MANY PATIENTS.     HOWEVER, SOME ARE BEST TREATED     AT CONCENTRATIONS OUTSIDE THIS     RANGE.     ACETAMINOPHEN CONCENTRATIONS     >150 ug/mL AT 4  HOURS AFTER     INGESTION AND >50 ug/mL AT 12     HOURS AFTER INGESTION ARE     OFTEN [name] WITH TOXIC     REACTIONS.  CBC WITH DIFFERENTIAL     Status: Abnormal   Collection Time    04/27/14 10:26 PM      Result Value Ref Range   WBC 14.7 (*) 4.0 - 10.5 K/uL   RBC 5.48  4.22 - 5.81 MIL/uL   Hemoglobin 16.7  13.0 - 17.0 g/dL   HCT 47.6  39.0 - 52.0 %   MCV 86.9  78.0 - 100.0 fL   MCH 30.5  26.0 - 34.0 pg   MCHC 35.1  30.0 - 36.0 g/dL   RDW 13.4  11.5 - 15.5 %   Platelets 246  150 - 400 K/uL   Neutrophils Relative % 86 (*) 43 - 77 %   Neutro Abs 12.6 (*) 1.7 - 7.7 K/uL   Lymphocytes Relative 7 (*) 12 - 46 %   Lymphs Abs 1.1  0.7 - 4.0 K/uL   Monocytes Relative 7  3 - 12 %   Monocytes Absolute 1.0  0.1 - 1.0 K/uL   Eosinophils Relative 0  0 - 5 %   Eosinophils Absolute 0.0  0.0 - 0.7 K/uL   Basophils Relative 0  0 - 1 %   Basophils Absolute 0.0  0.0 - 0.1 K/uL  COMPREHENSIVE METABOLIC PANEL     Status: Abnormal   Collection Time    04/27/14 10:26 PM      Result Value Ref Range   Sodium 141  137 - 147 mEq/L   Potassium 4.2  3.7 - 5.3 mEq/L   Chloride 101  96 - 112 mEq/L   CO2 25  19 - 32 mEq/L   Glucose, Bld 153 (*) 70 - 99 mg/dL   BUN 10  6 - 23 mg/dL    Creatinine, Ser 0.84  0.50 - 1.35 mg/dL   Calcium 10.2  8.4 - 10.5 mg/dL   Total Protein 8.3  6.0 - 8.3 g/dL   Albumin 4.4  3.5 - 5.2 g/dL   AST 26  0 - 37 U/L   ALT 35  0 - 53 U/L   Alkaline Phosphatase 76  39 - 117 U/L   Total Bilirubin 0.4  0.3 - 1.2 mg/dL   GFR calc non Af Amer >90  >90 mL/min   GFR calc Af Amer >90  >90 mL/min   Comment: (NOTE)     The eGFR has been calculated using the CKD EPI equation.     This calculation has not been validated in all clinical situations.     eGFR's persistently <90 mL/min signify possible Chronic Kidney     Disease.   Anion gap 15  5 - 15  URINE RAPID DRUG SCREEN (HOSP PERFORMED)     Status: Abnormal   Collection Time    04/27/14 11:37 PM      Result Value Ref Range   Opiates NONE DETECTED  NONE DETECTED   Cocaine NONE DETECTED  NONE DETECTED   Benzodiazepines POSITIVE (*) NONE DETECTED   Amphetamines NONE DETECTED  NONE DETECTED   Tetrahydrocannabinol NONE DETECTED  NONE DETECTED   Barbiturates NONE DETECTED  NONE DETECTED   Comment:            DRUG SCREEN FOR MEDICAL PURPOSES     ONLY.  IF CONFIRMATION IS NEEDED     FOR ANY PURPOSE, NOTIFY  LAB     WITHIN 5 DAYS.                LOWEST DETECTABLE LIMITS     FOR URINE DRUG SCREEN     Drug Class       Cutoff (ng/mL)     Amphetamine      1000     Barbiturate      200     Benzodiazepine   419     Tricyclics       622     Opiates          300     Cocaine          300     THC              50  MRSA PCR SCREENING     Status: None   Collection Time    04/28/14  1:30 AM      Result Value Ref Range   MRSA by PCR NEGATIVE  NEGATIVE   Comment:            The GeneXpert MRSA Assay (FDA     approved for NASAL specimens     only), is one component of a     comprehensive MRSA colonization     surveillance program. It is not     intended to diagnose MRSA     infection nor to guide or     monitor treatment for     MRSA infections.  CBC     Status: Abnormal   Collection Time    04/28/14   3:55 AM      Result Value Ref Range   WBC 13.3 (*) 4.0 - 10.5 K/uL   RBC 5.06  4.22 - 5.81 MIL/uL   Hemoglobin 15.0  13.0 - 17.0 g/dL   HCT 45.0  39.0 - 52.0 %   MCV 88.9  78.0 - 100.0 fL   MCH 29.6  26.0 - 34.0 pg   MCHC 33.3  30.0 - 36.0 g/dL   RDW 13.5  11.5 - 15.5 %   Platelets 216  150 - 400 K/uL  COMPREHENSIVE METABOLIC PANEL     Status: Abnormal   Collection Time    04/28/14  3:55 AM      Result Value Ref Range   Sodium 142  137 - 147 mEq/L   Potassium 3.6 (*) 3.7 - 5.3 mEq/L   Chloride 104  96 - 112 mEq/L   CO2 26  19 - 32 mEq/L   Glucose, Bld 139 (*) 70 - 99 mg/dL   BUN 9  6 - 23 mg/dL   Creatinine, Ser 0.84  0.50 - 1.35 mg/dL   Calcium 8.8  8.4 - 10.5 mg/dL   Total Protein 7.1  6.0 - 8.3 g/dL   Albumin 3.5  3.5 - 5.2 g/dL   AST 24  0 - 37 U/L   ALT 28  0 - 53 U/L   Alkaline Phosphatase 62  39 - 117 U/L   Total Bilirubin 0.3  0.3 - 1.2 mg/dL   GFR calc non Af Amer >90  >90 mL/min   GFR calc Af Amer >90  >90 mL/min   Comment: (NOTE)     The eGFR has been calculated using the CKD EPI equation.     This calculation has not been validated in all clinical situations.     eGFR's persistently <90 mL/min signify possible Chronic Kidney  Disease.   Anion gap 12  5 - 15  CARBAMAZEPINE LEVEL, TOTAL     Status: Abnormal   Collection Time    04/28/14  1:43 PM      Result Value Ref Range   Carbamazepine Lvl 16.0 (*) 4.0 - 12.0 ug/mL   Comment: CRITICAL RESULT CALLED TO, READ BACK BY AND VERIFIED WITH:     Aleen Sells [id number] 04/28/14 WBOND     Performed at [name]  CBC     Status: None   Collection Time    04/29/14  3:47 AM      Result Value Ref Range   WBC 8.5  4.0 - 10.5 K/uL   RBC 4.83  4.22 - 5.81 MIL/uL   Hemoglobin 14.2  13.0 - 17.0 g/dL   HCT 42.8  39.0 - 52.0 %   MCV 88.6  78.0 - 100.0 fL   MCH 29.4  26.0 - 34.0 pg   MCHC 33.2  30.0 - 36.0 g/dL   RDW 13.2  11.5 - 15.5 %   Platelets 189  150 - 400 K/uL  COMPREHENSIVE METABOLIC PANEL      Status: Abnormal   Collection Time    04/29/14  3:47 AM      Result Value Ref Range   Sodium 141  137 - 147 mEq/L   Potassium 3.9  3.7 - 5.3 mEq/L   Chloride 106  96 - 112 mEq/L   CO2 23  19 - 32 mEq/L   Glucose, Bld 89  70 - 99 mg/dL   BUN 6  6 - 23 mg/dL   Creatinine, Ser 0.79  0.50 - 1.35 mg/dL   Calcium 8.6  8.4 - 10.5 mg/dL   Total Protein 6.6  6.0 - 8.3 g/dL   Albumin 3.4 (*) 3.5 - 5.2 g/dL   AST 26  0 - 37 U/L   Comment: SLIGHT HEMOLYSIS     HEMOLYSIS AT THIS LEVEL MAY AFFECT RESULT   ALT 23  0 - 53 U/L   Alkaline Phosphatase 59  39 - 117 U/L   Total Bilirubin 0.3  0.3 - 1.2 mg/dL   GFR calc non Af Amer >90  >90 mL/min   GFR calc Af Amer >90  >90 mL/min   Comment: (NOTE)     The eGFR has been calculated using the CKD EPI equation.     This calculation has not been validated in all clinical situations.     eGFR's persistently <90 mL/min signify possible Chronic Kidney     Disease.   Anion gap 12  5 - 15  CARBAMAZEPINE LEVEL, TOTAL     Status: None   Collection Time    04/29/14  3:47 AM      Result Value Ref Range   Carbamazepine Lvl 11.7  4.0 - 12.0 ug/mL   Comment: Performed at [name] are reviewed and are pertinent for CBZ 22.4 and benzo, elevated WBC.  Current Facility-Administered Medications  Medication Dose Route Frequency Provider Last Rate Last Dose  . 0.9 % NaCl with KCl 20 mEq/ L  infusion   Intravenous Continuous Venetia Maxon Rama, MD 125 mL/hr at 04/29/14 0654    . acetaminophen (TYLENOL) tablet 650 mg  650 mg Oral Q6H PRN Jonetta Osgood, MD   650 mg at 04/29/14 0744   Or  . acetaminophen (TYLENOL) suppository 650 mg  650 mg Rectal Q6H PRN Jonetta Osgood, MD      .  albuterol (PROVENTIL) (2.5 MG/3ML) 0.083% nebulizer solution 2.5 mg  2.5 mg Nebulization Q2H PRN Shanker Kristeen Mans, MD      . alum & mag hydroxide-simeth (MAALOX/MYLANTA) 200-200-20 MG/5ML suspension 30 mL  30 mL Oral Q6H PRN Shanker Kristeen Mans, MD      .  guaiFENesin-dextromethorphan (ROBITUSSIN DM) 100-10 MG/5ML syrup 5 mL  5 mL Oral Q4H PRN Shanker Kristeen Mans, MD      . heparin injection 5,000 Units  5,000 Units Subcutaneous 3 times per day Jonetta Osgood, MD   5,000 Units at 04/29/14 0654  . LORazepam (ATIVAN) injection 1 mg  1 mg Intravenous Q6H PRN Shanker Kristeen Mans, MD      . neomycin-bacitracin-polymyxin (NEOSPORIN) ointment   Topical BID Shanker Kristeen Mans, MD      . ondansetron [name]) tablet 4 mg  4 mg Oral Q6H PRN Shanker Kristeen Mans, MD       Or  . ondansetron (ZOFRAN) injection 4 mg  4 mg Intravenous Q6H PRN Shanker Kristeen Mans, MD      . pantoprazole (PROTONIX) EC tablet 40 mg  40 mg Oral Daily Jonetta Osgood, MD   40 mg at 04/29/14 1019  . sodium chloride 0.9 % injection 3 mL  3 mL Intravenous Q12H Shanker Kristeen Mans, MD      . ziprasidone (GEODON) capsule 20 mg  20 mg Oral BID WC Durward Parcel, MD   20 mg at 04/29/14 0744    Psychiatric Specialty Exam: Physical Exam as per history and physical  Review of Systems  Psychiatric/Behavioral: Positive for depression, suicidal ideas, hallucinations and memory loss. The patient is nervous/anxious.     Blood pressure 162/85, pulse 69, temperature 98.8 F (37.1 C), temperature source Oral, resp. rate 20, height 6' 1"  (1.854 m), weight 225 lb (102.059 kg), SpO2 100.00%.Body mass index is 29.69 kg/(m^2).  General Appearance: a bit guarded  Eye Contact::  Fair  Speech:  Clear and Coherent and Slow  Volume:  Decreased  Mood:  Fairly good  Affect:  Less depressed  Thought Process:  Coherent and Goal Directed  Orientation:  Full (Time, Place, and Person)  Thought Content:  Denies hallucinations  Suicidal Thoughts:  No  Homicidal Thoughts:  No  Memory:  Immediate;   Fair Recent;   Fair  Judgement:  Impaired  Insight:  Lacking  Psychomotor Activity:  Restlessness  Concentration:  Fair  Recall:  [place]: Fair  Akathisia:  NA  Handed:   Right  AIMS (if indicated):     Assets:  Communication Skills Desire for Improvement Housing Leisure Time Resilience Social Support Talents/Skills  Sleep:      Musculoskeletal: Strength & Muscle Tone: decreased Gait & Station: unable to stand Patient leans: N/A  Treatment Plan Summary: Daily contact with patient to assess and evaluate symptoms and progress in treatment Medication management Patient seems more logical and coherent today. Will continue Geodon but increase dose given his history. No beds available at [name] today, will reassess patient tomorrow  [name] 04/29/2014 12:38 PM

## 2014-04-29 NOTE — Progress Notes (Signed)
CSW referred pt to [facility] for admission when pt is medically cleared. Per Otila Kluver, [facility] is currently full but pt but Otila Kluver stated she would add pt to list of referrals.   Rochele Pages,     ED CSW  phone: [phone] 10:30am

## 2014-04-29 NOTE — Progress Notes (Signed)
Progress Note   [name] WUJ:[id number] DOB: 1970-10-24 DOA: 04/27/2014 PCP: Pamelia Hoit, MD   Brief Narrative:   [name] is an 43 y.o. male with a PMH of schizophrenia, anxiety disorder with benzodiazepine dependence, prior suicidal attempts who was admitted 04/27/14 with an intentional drug overdose of 60-70 tablets of 100 mg extended-release Tegretol.   Assessment/Plan:   Principal Problem:   Tegretol drug overdose  Hemodynamically stable x48 hours.  Status post half of the dose of activated charcoal.  Initial Tegretol level 22.4---> 16---> 11.7.  UDS positive for benzodiazepines only. Ethanol less than 11. Salicylate less than 2. Acetaminophen less than 15.  Continue Recruitment consultant.  Evaluated by psychiatrist 04/28/14 with recommendations for transfer to inpatient [facility] for psychiatric stabilization.  Medically stable for transfer.  Active Problems:   Hypokalemia  Resolved with supplementation.    Schizoaffective disorder, bipolar type  Symmetrel, Tegretol, Sinequan, Zyprexa and Geodon all on hold.    Anxiety state, unspecified / Benzodiazepine dependence  Continue Xanax 1 mg 3 times a day as needed.    DVT Prophylaxis  Continue subcutaneous heparin.  Code Status: Full. Family Communication: Mother updated at bedside. Disposition Plan: [facility].   IV Access:    Peripheral IV   Procedures and diagnostic studies:    Dg Chest 2 View 04/28/2014: No acute cardiopulmonary findings.     Ct Head Wo Contrast 04/28/2014: No acute intracranial findings.     Medical Consultants:    Nehemiah Settle, MD, Psychiatry   Other Consultants:    None.   Anti-Infectives:    None.  Subjective:   [name] feels well except for some mild nausea. Appetite is good. Muscle tremors/spasms have resolved.  Objective:    Filed Vitals:   04/28/14 1200 04/28/14 1400 04/28/14 2156 04/29/14 0547  BP: 147/95 156/96 153/90  162/85  Pulse:  71 71 69  Temp:  98.4 F (36.9 C) 98.9 F (37.2 C) 98.8 F (37.1 C)  TempSrc:  Oral Oral Oral  Resp: 19 18 20 20   Height:  6\' 1"  (1.854 m)    Weight:  102.059 kg (225 lb)    SpO2:  96% 98% 100%    Intake/Output Summary (Last 24 hours) at 04/29/14 9562 Last data filed at 04/29/14 0340  Gross per 24 hour  Intake 479.17 ml  Output    900 ml  Net -420.83 ml    Exam: Gen:  NAD Psych: Affect normal.  Cardiovascular:  RRR, No M/R/G Respiratory:  Lungs CTAB Gastrointestinal:  Abdomen soft, NT/ND, + BS Extremities:  No C/E/C   Data Reviewed:    Labs: Basic Metabolic Panel:  Recent Labs Lab 04/27/14 2226 04/28/14 0355 04/29/14 0347  NA 141 142 141  K 4.2 3.6* 3.9  CL 101 104 106  CO2 25 26 23   GLUCOSE 153* 139* 89  BUN 10 9 6   CREATININE 0.84 0.84 0.79  CALCIUM 10.2 8.8 8.6   GFR Estimated Creatinine Clearance: 148 ml/min (by C-G formula based on Cr of 0.79). Liver Function Tests:  Recent Labs Lab 04/27/14 2226 04/28/14 0355 04/29/14 0347  AST 26 24 26   ALT 35 28 23  ALKPHOS 76 62 59  BILITOT 0.4 0.3 0.3  PROT 8.3 7.1 6.6  ALBUMIN 4.4 3.5 3.4*   CBC:  Recent Labs Lab 04/27/14 2226 04/28/14 0355 04/29/14 0347  WBC 14.7* 13.3* 8.5  NEUTROABS 12.6*  --   --   HGB 16.7 15.0 14.2  HCT 47.6 45.0 42.8  MCV 86.9 88.9 88.6  PLT 246 216 189   BNP (last 3 results)  Recent Labs  10/11/13 0101  PROBNP 21.9   Microbiology Recent Results (from the past 240 hour(s))  MRSA PCR SCREENING     Status: None   Collection Time    04/28/14  1:30 AM      Result Value Ref Range Status   MRSA by PCR NEGATIVE  NEGATIVE Final   Comment:            The GeneXpert MRSA Assay (FDA     approved for NASAL specimens     only), is one component of a     comprehensive MRSA colonization     surveillance program. It is not     intended to diagnose MRSA     infection nor to guide or     monitor treatment for     MRSA infections.     Medications:    . heparin  5,000 Units Subcutaneous 3 times per day  . neomycin-bacitracin-polymyxin   Topical BID  . pantoprazole  40 mg Oral Daily  . pneumococcal 23 valent vaccine  0.5 mL Intramuscular Tomorrow-1000  . sodium chloride  3 mL Intravenous Q12H  . ziprasidone  20 mg Oral BID WC   Continuous Infusions: . 0.9 % NaCl with KCl 20 mEq / L 125 mL/hr at 04/29/14 0654    Time spent: 25 minutes.   LOS: 2 days   Aspen Deterding  [facility] Pager [phone]. If unable to reach me by pager, please call my cell phone at [phone].  *Please refer to [email or web address], password [email or web address] to get updated schedule on who will round on this patient, as hospitalists switch teams weekly. If 7PM-7AM, please contact night-coverage at [email or web address], password [email or web address] for any overnight needs.  04/29/2014, 8:12 AM

## 2014-04-30 DIAGNOSIS — T50901S Poisoning by unspecified drugs, medicaments and biological substances, accidental (unintentional), sequela: Secondary | ICD-10-CM

## 2014-04-30 DIAGNOSIS — X838XXS Intentional self-harm by other specified means, sequela: Secondary | ICD-10-CM

## 2014-04-30 LAB — CARBAMAZEPINE, FREE AND TOTAL
CARBAMAZEPINE METABOLITE/: 3.9 ug/mL
CARBAMAZEPINE METABOLITE: 4.8 ug/mL — AB (ref 0.1–1.0)
CARBAMAZEPINE, BOUND: 9.1 ug/mL
CARBAMAZEPINE, TOTAL: 11.9 ug/mL (ref 4.0–12.0)
Carbamazepine Metabolite -: 8.7 ug/mL — ABNORMAL HIGH (ref 0.2–2.0)
Carbamazepine, Free: 2.8 ug/mL (ref 1.0–3.0)

## 2014-04-30 MED ORDER — BACITRACIN-NEOMYCIN-POLYMYXIN OINTMENT TUBE: 1.0000 "application " | TOPICAL_OINTMENT | Freq: Two times a day (BID) | CUTANEOUS | Status: DC

## 2014-04-30 MED ORDER — ZIPRASIDONE HCL 40 MG PO CAPS: 40.0000 mg | ORAL_CAPSULE | Freq: Two times a day (BID) | ORAL | Status: DC

## 2014-04-30 NOTE — Consult Note (Signed)
[facility] Face-to-Face Psychiatry Consult   Reason for Consult:  Intentional overdose of Tegretol and psychosis Referring Physician:  Dr. Rama [name] is an 43 y.o. male. Total Time spent with patient: 45 minutes  Assessment: AXIS I:  Schizoaffective Disorder AXIS II:  Deferred AXIS III:   Past Medical History  Diagnosis Date  . Migraines   . Schizophrenia, schizo-affective   . Chronic back pain   . Knee pain, chronic    AXIS IV:  other psychosocial or environmental problems, problems related to social environment and problems with primary support group AXIS V:  21-30 behavior considerably influenced by delusions or hallucinations OR serious impairment in judgment, communication OR inability to function in almost all areas  Plan:  Case discussed with Dr. Rockne Menghini and Wells Guiles, [facility] from [facility] Will start Geodon 20 mg BID for psychosis and agitation Monitor for Carbamazepine level and discontinue Tegretol Recommend psychiatric Inpatient admission when medically cleared. Supportive therapy provided about ongoing stressors. Appreciate psychiatric consultation and follow up as clinically required Please contact [phone] if needs further assistance  Subjective:   [name] is a 43 y.o. male patient admitted with suicidal attempt.  HPI: Patient is seen, chart reviewed, case discussed with his Staff RN. Patient stated that he has taken large dose of his medication Tegretol because he saw there are too many pills in his container and his psychiatrist informed him that he is not taking enough during past visit, so he has decided to take overdose. He felt drunk, could not walk, keep falling aroud his apartment and until his mother came and saw him and contacted EMS. Patient is having hard time to remember his providers and his medications. He is stated that he has been diagnosed with paranoia, has one episode of over dose and multiple acute psych hospital admission including [facility] and [place]. Patient has been hearing auditory hallucinations, which are derogatory in nature but denied visual hallucinations and being paranoid. He reports living alone and being disabled. His wife and child has been living some where else. He denied family history of mental illness. He denied recent medication changes. He denied substance abuse and his last drink of alcohol about four months ago. He has been seeing psych at [facility] and his last admission to [facility] is June 2015. His CBZ level is 24.4 Ug/ML ( therapeutic level is 4-12)  Patient seen today 04/30/14. He is tired and claims he didn't sleep last night. He claims his mood is good and he denies suicidal ideation or homicidal ideation. He denies hearing voices or other hallucinations, Mom states he is at baseline and is willing to take him home.  Medical history: [name] is a 43 y.o. male with a Past Medical History of schizophrenia, anxiety disorder, prior suicidal attempts who presents today with the above noted complaint. Per patient, approximately around 7 PM on 04/26/14 patient to approximately 70 tablets of Tegretol. He apparently had seen his psychiatrist a few days back and was told that he was not taking his Tegretol. So to proove a point to his psychiatrist, he took approximately 70 tablets of Tegretol. Following which he should became very unsteady, and was unable to ambulate. He was essentially crawling around his apartment, and suffered numerous "carpet burns". He claims that he vomited numerous times during night. His family tried calling him, however since they got no response, they decided to check on him on 8/20. They found him on the floor,  crawling, unable to ambulate or get up. There was evidence of vomitus all over his apartment. EMS was called, patient was thought to [facility] for further evaluation and treatment. I was then asked to admit this patient.  During my evaluation, although  slightly drowsy, he is awake and alert. He denies that this overdose was a suicidal attempt. Patient denies any alcohol use, or any other polysubstance use  HPI Elements:   Location:  psychosis and suicidal attempt. Quality:  poor. Severity:  acute. Timing:  drug overdose and non compliance with medications.  Past Psychiatric History: Past Medical History  Diagnosis Date  . Migraines   . Schizophrenia, schizo-affective   . Chronic back pain   . Knee pain, chronic     reports that he has been smoking Cigarettes.  He has a 30 pack-year smoking history. He does not have any smokeless tobacco history on file. He reports that he does not drink alcohol or use illicit drugs. No family history on file.   Living Arrangements: Alone   Abuse/Neglect [facility]) Physical Abuse: Denies Verbal Abuse: Denies Sexual Abuse: Denies Allergies:   Allergies  Allergen Reactions  . Nsaids Other (See Comments)    Rectal bleeding  . Quetiapine Other (See Comments)    Psychosis with high dose (600mg )    ACT Assessment Complete:  NO Objective: Blood pressure 142/73, pulse 68, temperature 98.3 F (36.8 C), temperature source Oral, resp. rate 16, height 6\' 1"  (1.854 m), weight 225 lb (102.059 kg), SpO2 97.00%.Body mass index is 29.69 kg/(m^2).  Labs are reviewed and are pertinent for  normalized  Current Facility-Administered Medications  Medication Dose Route Frequency Provider Last Rate Last Dose  . 0.9 % NaCl with KCl 20 mEq/ L  infusion   Intravenous Continuous [name] Maxon Rama, MD 125 mL/hr at 04/30/14 0539    . acetaminophen (TYLENOL) tablet 650 mg  650 mg Oral Q6H PRN [name] Osgood, MD   650 mg at 04/30/14 0522   Or  . acetaminophen (TYLENOL) suppository 650 mg  650 mg Rectal Q6H PRN Shanker Kristeen Mans, MD      . albuterol (PROVENTIL) (2.5 MG/3ML) 0.083% nebulizer solution 2.5 mg  2.5 mg Nebulization Q2H PRN Shanker Kristeen Mans, MD      . alum & mag hydroxide-simeth (MAALOX/MYLANTA) 200-200-20  MG/5ML suspension 30 mL  30 mL Oral Q6H PRN Shanker Kristeen Mans, MD      . guaiFENesin-dextromethorphan (ROBITUSSIN DM) 100-10 MG/5ML syrup 5 mL  5 mL Oral Q4H PRN Shanker Kristeen Mans, MD      . heparin injection 5,000 Units  5,000 Units Subcutaneous 3 times per day [name] Osgood, MD   5,000 Units at 04/29/14 2140  . LORazepam (ATIVAN) injection 1 mg  1 mg Intravenous Q6H PRN [name] Osgood, MD      . neomycin-bacitracin-polymyxin (NEOSPORIN) ointment   Topical BID [name] Osgood, MD   1 application at 24/40/10 1003  . ondansetron (ZOFRAN) tablet 4 mg  4 mg Oral Q6H PRN Shanker Kristeen Mans, MD       Or  . ondansetron (ZOFRAN) injection 4 mg  4 mg Intravenous Q6H PRN Shanker Kristeen Mans, MD      . pantoprazole (PROTONIX) EC tablet 40 mg  40 mg Oral Daily [name] Osgood, MD   40 mg at 04/30/14 1003  . sodium chloride 0.9 % injection 3 mL  3 mL Intravenous Q12H [name] Osgood, MD   3 mL at 04/29/14 2140  .  ziprasidone (GEODON) capsule 40 mg  40 mg Oral BID WC Levonne Spiller, MD   40 mg at 04/30/14 5945    Psychiatric Specialty Exam: Physical Exam as per history and physical  Review of Systems  Psychiatric/Behavioral: Positive for depression, suicidal ideas, hallucinations and memory loss. The patient is nervous/anxious.     Blood pressure 142/73, pulse 68, temperature 98.3 F (36.8 C), temperature source Oral, resp. rate 16, height 6\' 1"  (1.854 m), weight 225 lb (102.059 kg), SpO2 97.00%.Body mass index is 29.69 kg/(m^2).  General Appearance: a bit guarded  Eye Contact::  Fair  Speech:  Clear and Coherent and Slow  Volume:  Decreased  Mood:  Fairly good  Affect:  Less depressed but tired  Thought Process:  Coherent and Goal Directed  Orientation:  Full (Time, Place, and Person)  Thought Content:  Denies hallucinations  Suicidal Thoughts:  No  Homicidal Thoughts:  No  Memory:  Immediate;   Fair Recent;   Fair  Judgement:  Impaired  Insight:  Lacking  Psychomotor Activity:  decreased  Concentration:  Fair  Recall:  [place]: Fair  Akathisia:  NA  Handed:  Right  AIMS (if indicated):   n/a  Assets:  Communication Skills Desire for Improvement Housing Leisure Time Resilience Social Support Talents/Skills  Sleep:      Musculoskeletal: Strength & Muscle Tone: decreased Gait & Station: unable to stand Patient leans: N/A  Treatment Plan Summary: Daily contact with patient to assess and evaluate symptoms and progress in treatment Medication management Patient does not meet criteria for inpatient commitment any more, will d/c IVC. Recommend discharge home on Geodon 40 mg bid. Mom not happy with [facility], they will probably return to family MD for f/u Foye Damron, [facility] 04/30/2014 1:26 PM

## 2014-04-30 NOTE — Progress Notes (Signed)
Patient discharged home, all discharge medications and instructions reviewed with mother and patient.  Patient to be assisted to vehicle by wheelchair.

## 2014-04-30 NOTE — Discharge Summary (Signed)
Physician Discharge Summary  [name] Swann ZOX:[id number] DOB: 02-22-1971 DOA: 04/27/2014  PCP: Pamelia Hoit, MD  Admit date: 04/27/2014 Discharge date: 04/30/2014   Recommendations for Outpatient Follow-Up:   1. Recommend close follow up with psychiatry.   Discharge Diagnosis:   Principal Problem:   Intentional drug overdose / Tegretol poisoning Active Problems:    Schizoaffective disorder, bipolar type    Anxiety state, unspecified    Benzodiazepine dependence    Hypokalemia  Discharge Condition: Improved.  Diet recommendation: Regular.   History of Present Illness:   [name] is an 43 y.o. male with a PMH of schizophrenia, anxiety disorder with benzodiazepine dependence, prior suicidal attempts who was admitted 04/27/14 with an intentional drug overdose of 60-70 tablets of 100 mg extended-release Tegretol.   Hospital Course by Problem:   Principal Problem:  Tegretol drug overdose  Hemodynamically stable x72 hours.  Status treatment with activated charcoal.  Initial Tegretol level 22.4---> 16---> 11.7.  UDS positive for benzodiazepines only. Ethanol less than 11. Salicylate less than 2. Acetaminophen less than 15.  Managed with safety sitter while in hospital.  Evaluated by psychiatrist 04/28/14 with recommendations for transfer to inpatient [facility] for psychiatric stabilization. No bed immediately available at [facility], and was felt to be no longer committable upon reevaluation 04/30/14. IVC rescinded. Medically stable and psychiatrically cleared for discharge home.  Active Problems:  Hypokalemia  Resolved with supplementation.  Schizoaffective disorder, bipolar type  Symmetrel, Tegretol, Sinequan, Zyprexa and Geodon all on hold. Resume Symmetrel and Geodon at half his normal dose (40 mg twice a day) at discharge.  Anxiety state, unspecified / Benzodiazepine dependence  Continue Xanax 1 mg 3 times a day as needed.   Medical Consultants:    Dr.  Diannia Ruder, Psychiatry.   Discharge Exam:   Filed Vitals:   04/30/14 1345  BP: 148/88  Pulse: 71  Temp: 99.5 F (37.5 C)  Resp: 18   Filed Vitals:   04/29/14 1315 04/29/14 2312 04/30/14 0520 04/30/14 1345  BP: 152/90 157/95 142/73 148/88  Pulse: 76 60 68 71  Temp: 98.7 F (37.1 C) 98.9 F (37.2 C) 98.3 F (36.8 C) 99.5 F (37.5 C)  TempSrc: Oral Oral Oral Oral  Resp: 20 18 16 18   Height:      Weight:      SpO2: 99% 98% 97% 98%    Gen:  NAD Cardiovascular:  RRR, No M/R/G Respiratory: Lungs CTAB Gastrointestinal: Abdomen soft, NT/ND with normal active bowel sounds. Extremities: No C/E/C   The results of significant diagnostics from this hospitalization (including imaging, microbiology, ancillary and laboratory) are listed below for reference.     Procedures and Diagnostic Studies:   Dg Chest 2 View 04/28/2014: No acute cardiopulmonary findings.  Ct Head Wo Contrast 04/28/2014: No acute intracranial findings.    Labs:   Basic Metabolic Panel:  Recent Labs Lab 04/27/14 2226 04/28/14 0355 04/29/14 0347  NA 141 142 141  K 4.2 3.6* 3.9  CL 101 104 106  CO2 25 26 23   GLUCOSE 153* 139* 89  BUN 10 9 6   CREATININE 0.84 0.84 0.79  CALCIUM 10.2 8.8 8.6   GFR Estimated Creatinine Clearance: 148 ml/min (by C-G formula based on Cr of 0.79). Liver Function Tests:  Recent Labs Lab 04/27/14 2226 04/28/14 0355 04/29/14 0347  AST 26 24 26   ALT 35 28 23  ALKPHOS 76 62 59  BILITOT 0.4 0.3 0.3  PROT 8.3 7.1 6.6  ALBUMIN 4.4 3.5 3.4*  CBC:  Recent Labs Lab 04/27/14 2226 04/28/14 0355 04/29/14 0347  WBC 14.7* 13.3* 8.5  NEUTROABS 12.6*  --   --   HGB 16.7 15.0 14.2  HCT 47.6 45.0 42.8  MCV 86.9 88.9 88.6  PLT 246 216 189     Ref. Range 03/04/2014 06:26 04/27/2014 22:26 04/28/2014 03:55 04/28/2014 13:43 04/29/2014 03:47  Carbamazepine Lvl Latest Range: 4.0-12.0 ug/mL 5.9 22.4 (HH)  16.0 (HH) 11.7   Microbiology Recent Results (from the past 240  hour(s))  MRSA PCR SCREENING     Status: None   Collection Time    04/28/14  1:30 AM      Result Value Ref Range Status   MRSA by PCR NEGATIVE  NEGATIVE Final   Comment:            The GeneXpert MRSA Assay (FDA     approved for NASAL specimens     only), is one component of a     comprehensive MRSA colonization     surveillance program. It is not     intended to diagnose MRSA     infection nor to guide or     monitor treatment for     MRSA infections.     Discharge Instructions:   Discharge Instructions   Call MD for:    Complete by:  As directed   Thoughts of wanting to hurt yourself or others.     Diet general    Complete by:  As directed      Discharge instructions    Complete by:  As directed   You were cared for by Dr. Hillery Aldo  (a hospitalist) during your hospital stay. If you have any questions about your discharge medications or the care you received while you were in the hospital after you are discharged, you can call the unit and ask to speak with the hospitalist on call if the hospitalist that took care of you is not available. Once you are discharged, your primary care physician will handle any further medical issues. Please note that NO REFILLS for any discharge medications will be authorized once you are discharged, as it is imperative that you return to your primary care physician (or establish a relationship with a primary care physician if you do not have one) for your aftercare needs so that they can reassess your need for medications and monitor your lab values.  Any outstanding tests can be reviewed by your PCP at your follow up visit.  It is also important to review any medicine changes with your PCP.  Please bring these d/c instructions with you to your next visit so your physician can review these changes with you.  If you do not have a primary care physician, you can call [phone] for a physician referral.  It is highly recommended that you obtain a PCP for  hospital follow up.     Increase activity slowly    Complete by:  As directed             Medication List    STOP taking these medications       carbamazepine 100 MG 12 hr tablet  Commonly known as:  TEGRETOL XR     doxepin 150 MG capsule  Commonly known as:  SINEQUAN     HYDROcodone-acetaminophen 10-325 MG per tablet  Commonly known as:  NORCO     OLANZapine 10 MG tablet  Commonly known as:  ZYPREXA      TAKE these medications  acetaminophen 500 MG tablet  Commonly known as:  TYLENOL  Take 1,000 mg by mouth every 6 (six) hours as needed for mild pain.     ALPRAZolam 1 MG tablet  Commonly known as:  XANAX  Take 1 mg by mouth 3 (three) times daily as needed for anxiety.     amantadine 100 MG capsule  Commonly known as:  SYMMETREL  Take 1 capsule (100 mg total) by mouth 2 (two) times daily.     hydrOXYzine 25 MG tablet  Commonly known as:  ATARAX/VISTARIL  Take 1 tablet (25 mg total) by mouth 3 (three) times daily.     neomycin-bacitracin-polymyxin Oint  Commonly known as:  NEOSPORIN  Apply 1 application topically 2 (two) times daily.     omeprazole 40 MG capsule  Commonly known as:  PRILOSEC  Take 1 capsule (40 mg total) by mouth 2 (two) times daily.     ziprasidone 40 MG capsule  Commonly known as:  GEODON  Take 1 capsule (40 mg total) by mouth 2 (two) times daily with a meal.          Time coordinating discharge: 35 minutes.  Signed:  Naaman Curro  Pager [phone] [facility] 04/30/2014, 2:40 PM

## 2014-04-30 NOTE — ED Notes (Signed)
Opened chart to identify location of personal belongings

## 2014-05-01 NOTE — Progress Notes (Signed)
Patients mother notified that lost belongings have been located and will be at the nurses desk for pick-up. Mother informed RN that she was on the way.

## 2014-07-27 ENCOUNTER — Encounter (HOSPITAL_COMMUNITY): Payer: Self-pay | Admitting: Emergency Medicine

## 2014-07-27 ENCOUNTER — Emergency Department (HOSPITAL_COMMUNITY)
Admission: EM | Admit: 2014-07-27 | Discharge: 2014-07-27 | Discharge status: Against Medical Advice | Payer: PRIVATE HEALTH INSURANCE | Attending: Emergency Medicine | Admitting: Emergency Medicine

## 2014-07-27 DIAGNOSIS — Z008 Encounter for other general examination: Secondary | ICD-10-CM | POA: Insufficient documentation

## 2014-07-27 DIAGNOSIS — Z8679 Personal history of other diseases of the circulatory system: Secondary | ICD-10-CM | POA: Diagnosis not present

## 2014-07-27 DIAGNOSIS — G8929 Other chronic pain: Secondary | ICD-10-CM | POA: Diagnosis not present

## 2014-07-27 DIAGNOSIS — Z8739 Personal history of other diseases of the musculoskeletal system and connective tissue: Secondary | ICD-10-CM | POA: Insufficient documentation

## 2014-07-27 DIAGNOSIS — Z720 Tobacco use: Secondary | ICD-10-CM | POA: Insufficient documentation

## 2014-07-27 NOTE — ED Notes (Signed)
Pt states he is out of his medications for his psych issues and is not feeling well  Pt is talking with flight of ideas  Cannot concentrate and answer questions appropriately  Pt states he has been having suicidal thoughts but not lately but states he is having audible hallucinations

## 2014-07-27 NOTE — ED Notes (Signed)
Pt states he is going out and took his stuff and walked out of dept  When asked if he was coming back pt did not answer and continued to walk out off dept

## 2014-07-28 ENCOUNTER — Encounter (HOSPITAL_COMMUNITY): Payer: Self-pay | Admitting: Emergency Medicine

## 2014-07-28 ENCOUNTER — Emergency Department (HOSPITAL_COMMUNITY)
Admission: EM | Admit: 2014-07-28 | Discharge: 2014-07-31 | Disposition: A | Discharge status: Other Acute Inpatient Hospital | Payer: PRIVATE HEALTH INSURANCE | Attending: Emergency Medicine | Admitting: Emergency Medicine

## 2014-07-28 DIAGNOSIS — Z79899 Other long term (current) drug therapy: Secondary | ICD-10-CM | POA: Insufficient documentation

## 2014-07-28 DIAGNOSIS — Z792 Long term (current) use of antibiotics: Secondary | ICD-10-CM | POA: Diagnosis not present

## 2014-07-28 DIAGNOSIS — Z720 Tobacco use: Secondary | ICD-10-CM | POA: Diagnosis not present

## 2014-07-28 DIAGNOSIS — Z8679 Personal history of other diseases of the circulatory system: Secondary | ICD-10-CM | POA: Diagnosis not present

## 2014-07-28 DIAGNOSIS — R443 Hallucinations, unspecified: Secondary | ICD-10-CM | POA: Diagnosis present

## 2014-07-28 DIAGNOSIS — F250 Schizoaffective disorder, bipolar type: Secondary | ICD-10-CM | POA: Diagnosis present

## 2014-07-28 DIAGNOSIS — F258 Other schizoaffective disorders: Secondary | ICD-10-CM | POA: Diagnosis not present

## 2014-07-28 DIAGNOSIS — G8929 Other chronic pain: Secondary | ICD-10-CM | POA: Insufficient documentation

## 2014-07-28 DIAGNOSIS — F259 Schizoaffective disorder, unspecified: Secondary | ICD-10-CM | POA: Diagnosis present

## 2014-07-28 LAB — COMPREHENSIVE METABOLIC PANEL
ALK PHOS: 61 U/L (ref 39–117)
ALT: 46 U/L (ref 0–53)
AST: 29 U/L (ref 0–37)
Albumin: 4.1 g/dL (ref 3.5–5.2)
Anion gap: 13 (ref 5–15)
BILIRUBIN TOTAL: 0.4 mg/dL (ref 0.3–1.2)
BUN: 3 mg/dL — AB (ref 6–23)
CHLORIDE: 97 meq/L (ref 96–112)
CO2: 26 mEq/L (ref 19–32)
CREATININE: 0.94 mg/dL (ref 0.50–1.35)
Calcium: 9.5 mg/dL (ref 8.4–10.5)
Glucose, Bld: 109 mg/dL — ABNORMAL HIGH (ref 70–99)
Potassium: 3.3 mEq/L — ABNORMAL LOW (ref 3.7–5.3)
Sodium: 136 mEq/L — ABNORMAL LOW (ref 137–147)
Total Protein: 7.5 g/dL (ref 6.0–8.3)

## 2014-07-28 LAB — CBC WITH DIFFERENTIAL/PLATELET
Basophils Absolute: 0.1 10*3/uL (ref 0.0–0.1)
Basophils Relative: 1 % (ref 0–1)
Eosinophils Absolute: 0.1 10*3/uL (ref 0.0–0.7)
Eosinophils Relative: 1 % (ref 0–5)
HEMATOCRIT: 45.1 % (ref 39.0–52.0)
Hemoglobin: 15.5 g/dL (ref 13.0–17.0)
LYMPHS ABS: 2.8 10*3/uL (ref 0.7–4.0)
Lymphocytes Relative: 34 % (ref 12–46)
MCH: 30.4 pg (ref 26.0–34.0)
MCHC: 34.4 g/dL (ref 30.0–36.0)
MCV: 88.4 fL (ref 78.0–100.0)
MONO ABS: 0.7 10*3/uL (ref 0.1–1.0)
MONOS PCT: 8 % (ref 3–12)
NEUTROS ABS: 4.8 10*3/uL (ref 1.7–7.7)
NEUTROS PCT: 56 % (ref 43–77)
Platelets: 175 10*3/uL (ref 150–400)
RBC: 5.1 MIL/uL (ref 4.22–5.81)
RDW: 12.8 % (ref 11.5–15.5)
WBC: 8.4 10*3/uL (ref 4.0–10.5)

## 2014-07-28 LAB — RAPID URINE DRUG SCREEN, HOSP PERFORMED
Amphetamines: NOT DETECTED
Barbiturates: NOT DETECTED
Benzodiazepines: NOT DETECTED
Cocaine: NOT DETECTED
Opiates: NOT DETECTED
Tetrahydrocannabinol: NOT DETECTED

## 2014-07-28 LAB — ETHANOL

## 2014-07-28 MED ORDER — PANTOPRAZOLE SODIUM 40 MG PO TBEC
40.0000 mg | DELAYED_RELEASE_TABLET | Freq: Every day | ORAL | Status: DC
  Administered 2014-07-28 – 2014-07-31 (×4): 40 mg via ORAL
  Filled 2014-07-28 (×4): qty 1

## 2014-07-28 MED ORDER — ALUM & MAG HYDROXIDE-SIMETH 200-200-20 MG/5ML PO SUSP: 30.0000 mL | ORAL | Status: DC | PRN

## 2014-07-28 MED ORDER — HYDROXYZINE HCL 25 MG PO TABS
25.0000 mg | ORAL_TABLET | Freq: Three times a day (TID) | ORAL | Status: DC
  Administered 2014-07-28 – 2014-07-31 (×9): 25 mg via ORAL
  Filled 2014-07-28 (×9): qty 1

## 2014-07-28 MED ORDER — ZIPRASIDONE HCL 20 MG PO CAPS
20.0000 mg | ORAL_CAPSULE | Freq: Two times a day (BID) | ORAL | Status: DC
  Administered 2014-07-28 – 2014-07-31 (×6): 20 mg via ORAL
  Filled 2014-07-28 (×7): qty 1

## 2014-07-28 MED ORDER — LORAZEPAM 1 MG PO TABS: 1.0000 mg | ORAL_TABLET | Freq: Three times a day (TID) | ORAL | Status: DC | PRN

## 2014-07-28 MED ORDER — POTASSIUM CHLORIDE CRYS ER 20 MEQ PO TBCR
20.0000 meq | EXTENDED_RELEASE_TABLET | Freq: Every day | ORAL | Status: AC
  Filled 2014-07-28 (×3): qty 1

## 2014-07-28 MED ORDER — AMANTADINE HCL 100 MG PO CAPS: 100.0000 mg | ORAL_CAPSULE | Freq: Two times a day (BID) | ORAL | Status: DC

## 2014-07-28 MED ORDER — ZIPRASIDONE HCL 20 MG PO CAPS: 40.0000 mg | ORAL_CAPSULE | Freq: Two times a day (BID) | ORAL | Status: DC

## 2014-07-28 MED ORDER — NICOTINE 21 MG/24HR TD PT24: 21.0000 mg | MEDICATED_PATCH | Freq: Every day | TRANSDERMAL | Status: DC

## 2014-07-28 MED ORDER — ONDANSETRON HCL 4 MG PO TABS: 4.0000 mg | ORAL_TABLET | Freq: Three times a day (TID) | ORAL | Status: DC | PRN

## 2014-07-28 MED ORDER — ACETAMINOPHEN 325 MG PO TABS
650.0000 mg | ORAL_TABLET | ORAL | Status: DC | PRN
  Administered 2014-07-29 – 2014-07-31 (×3): 650 mg via ORAL
  Filled 2014-07-28 (×3): qty 2

## 2014-07-28 MED ORDER — ZOLPIDEM TARTRATE 5 MG PO TABS
5.0000 mg | ORAL_TABLET | Freq: Every evening | ORAL | Status: DC | PRN
  Administered 2014-07-29 (×2): 5 mg via ORAL
  Filled 2014-07-28 (×2): qty 1

## 2014-07-28 NOTE — ED Notes (Signed)
Patient ate his lunch tray.

## 2014-07-28 NOTE — ED Notes (Signed)
Patient's belongings locked in locker 9.  His SS card and health insurance card placed in his belongings bag.

## 2014-07-28 NOTE — ED Notes (Addendum)
Per psychiatry, pt will be rounded on this afternoon closer to 3:30pm.  Family would like to speak to them regarding verbalization from patient at home of wanting to hurt himself.  Family is leaving and coming back at 3pm.  If seen prior, please call Mom - Merrilyn Puma at [phone]

## 2014-07-28 NOTE — ED Notes (Signed)
Cell phone and wallet given to mother Fraser Din.

## 2014-07-28 NOTE — ED Notes (Signed)
Pt and belongings has been wanded

## 2014-07-28 NOTE — BH Assessment (Signed)
[facility] Assessment Progress Note   Received call for a tele assessment on the pt.  Called Fransico Him, PA-C to gain clinical information on the pt @ 1125 and tele assessment scheduled with this clinician for 1130.  Shaune Pascal, MS, [facility] Licensed Professional Counselor Therapeutic Triage Specialist [facility] Phone: [phone] Fax: [phone]

## 2014-07-28 NOTE — ED Notes (Addendum)
Pt reports hearing voices for the past month telling him to hurt himself. Pt reports he has a plan, but does not plan to act on it. Has had medications changes recently. Denies thoughts of hurting others. Pt has had psychiatric admissions before.

## 2014-07-28 NOTE — ED Provider Notes (Signed)
CSN: [id number]     Arrival date & time 07/28/14  0957 History   First MD Initiated Contact with Patient 07/28/14 1004     Chief Complaint  Patient presents with  . Hallucinations  . Suicidal     (Consider location/radiation/quality/duration/timing/severity/associated sxs/prior Treatment) HPI Comments: Patient with a history of Schizophrenia and previous suicide attempts presents today with a chief complaint of auditory hallucinations.  He reports that he has hearing voices for the past month.  He reports that the voices are telling him to hurt himself.  He states that he does have a plan, but states that he will not execute the plan.  He will not state what the plan is.  He reports that he has been off of his antipsychotic medications for the past 2-3 months.  He reports that he had a Psychiatrist, but she quit.  Therefore, he does not have a psychiatrist at this time.  He reports that he does take Xanax daily for anxiety.  Denies recent alcohol or recreational drug use.  He denies any physical symptoms at this time.  Patient was admitted at [facility] after an overdose three months ago.  His mother states that the medications that he was discharged on at that time controlled his Schizophrenia better than anything ever has in the past.    The history is provided by the patient.    Past Medical History  Diagnosis Date  . Migraines   . Schizophrenia, schizo-affective   . Chronic back pain   . Knee pain, chronic    Past Surgical History  Procedure Laterality Date  . Knee surgery      four times  . Extraction of wisdom teeth     History reviewed. No pertinent family history. History  Substance Use Topics  . Smoking status: Current Every Day Smoker -- 3.00 packs/day for 10 years    Types: Cigarettes    Last Attempt to Quit: 02/26/2014  . Smokeless tobacco: Not on file  . Alcohol Use: No     Comment: occ    Review of Systems  All other systems reviewed and are  negative.     Allergies  Nsaids and Quetiapine  Home Medications   Prior to Admission medications   Medication Sig Start Date End Date Taking? Authorizing Provider  acetaminophen (TYLENOL) 500 MG tablet Take 1,000 mg by mouth every 6 (six) hours as needed for mild pain.    Historical Provider, MD  ALPRAZolam [name]) 1 MG tablet Take 1 mg by mouth 3 (three) times daily as needed for anxiety.  03/31/14   Historical Provider, MD  amantadine (SYMMETREL) 100 MG capsule Take 1 capsule (100 mg total) by mouth 2 (two) times daily. 03/06/14   Elmarie Shiley, NP  hydrOXYzine (ATARAX/VISTARIL) 25 MG tablet Take 1 tablet (25 mg total) by mouth 3 (three) times daily. 03/06/14   Elmarie Shiley, NP  neomycin-bacitracin-polymyxin (NEOSPORIN) OINT Apply 1 application topically 2 (two) times daily. 04/30/14   Venetia Maxon Rama, MD  omeprazole (PRILOSEC) 40 MG capsule Take 1 capsule (40 mg total) by mouth 2 (two) times daily. 03/06/14   Elmarie Shiley, NP  ziprasidone (GEODON) 40 MG capsule Take 1 capsule (40 mg total) by mouth 2 (two) times daily with a meal. 04/30/14   Christina P Rama, MD   BP 125/81 mmHg  Pulse 87  Temp(Src) 98.5 F (36.9 C) (Oral)  Resp 18  SpO2 99% Physical Exam  Constitutional: He appears well-developed and well-nourished.  HENT:  Head: Normocephalic and atraumatic.  Eyes: EOM are normal. Pupils are equal, round, and reactive to light.  Neck: Normal range of motion. Neck supple.  Cardiovascular: Normal rate, regular rhythm and normal heart sounds.   Pulmonary/Chest: Effort normal and breath sounds normal.  Musculoskeletal: Normal range of motion.  Neurological: He is alert.  Skin: Skin is warm and dry.  Psychiatric: He has a normal mood and affect. He is actively hallucinating. He expresses suicidal ideation. He expresses no homicidal ideation. He expresses suicidal plans. He expresses no homicidal plans.  Auditory hallucinations  Nursing note and vitals reviewed.   ED Course   Procedures (including critical care time) Labs Review Labs Reviewed  CBC WITH DIFFERENTIAL  COMPREHENSIVE METABOLIC PANEL  URINE RAPID DRUG SCREEN (HOSP PERFORMED)  ETHANOL    Imaging Review No results found.   EKG Interpretation None      MDM   Final diagnoses:  None   Patient with a history of Schizophrenia presents today voluntarily due to auditory hallucinations.  He reports that he is hearing voices telling him to hurt himself.  He reports that he does have a suicidal plan, but states that he would not act on it.  He has previous suicide attempts in the past.  No physical symptoms at this time.  Labs today unremarkable.  TTS consulted.  Psych holding orders placed.    Hyman Bible, PA-C 07/28/14 [place], MD 07/28/14 [phone]

## 2014-07-28 NOTE — Progress Notes (Signed)
CARE MANAGEMENT ED NOTE 07/28/2014  Patient:  [name], [name]   Account Number:  [id number]  Date Initiated:  07/28/2014  Documentation initiated by:  Westly Pam Assessment:   43 yr old united health care evercare/medicaid [place] access c/o  hearing voices for the past month telling him to hurt himself. Pt reports he has a plan, but does not plan to act on it.     Subjective/Objective Assessment Detail:   Has had medications changes recently. Denies thoughts of hurting others.    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 Confirms still sees Dr Kathryne Eriksson but he wants [facility] meds managed by a psychiatrist Pt states he   Action/Plan Detail:   was suppose to go to [facility], he called united health care and is aware that [place] is listed on his medicaid card as [facility] provider   Anticipated DC Date:       Status Recommendation to Physician:   Result of Recommendation:    Other ED [place]  Other  Outpatient Services - Pt will follow up    Choice offered to / List presented to:            Status of service:  Completed, signed off  ED Comments:   ED Comments Detail:  CM checked EPIC and per medicaid response history Kathryne Eriksson at [facility] in [place] is his pcp on medicaid card List of some of [place] providers given to pt in a 5 page list from [email or web address] Elane Fritz  Yes  2.8 miles  Physician/Psychiatrist  [place], [place]  [address] [place] [phone] Lorel Monaco  Yes  3.6 miles  Physician/Psychiatrist [place], [place] [address] [place] [phone] Jeri Lager  Yes  3.6 miles  Physician/Psychiatrist  [place], [place] [address] [place] [phone] Johnsie Cancel  Yes  3.6 miles  Physician/Psychiatrist [place], [place] [address] [place] [phone] Joanna Puff  Yes  3.6 miles Physician/Psychiatrist  [place], [place] [address] [place] [phone] [place], Donata Duff  Yes  3.6 miles  Physician/Psychiatrist [place], [place] [address] [place] [phone] Fernanda Drum  Yes  3.6 miles  Physician/Psychiatrist  [place], [place] [address] [place] [phone] Dora Sims  Yes  3.6 miles  Physician/Psychiatrist  [place], [place] [address] [place] [phone] Windell Norfolk  Yes  3.6 miles  Physician/Psychiatrist [place], [place] [address] [place] [phone] Altamese Dilling  No  3.7 miles  Physician/Psychiatrist  [place], [place] [address] [place] [phone] Victoriano Lain  Yes  3.7 miles  Physician/Psychiatrist  [place], [place] [address] [place] [phone] Carloyn Jaeger  Yes  4.4 miles  Physician/Psychiatrist [place], [place] [address] [facility] [phone] Carloyn Jaeger  Yes  4.4  miles  Physician/Psychiatrist [place], [place] [address] [place] [phone] Lambert Mody  Yes  4.5 miles  Physician/Psychiatrist [place], [place] [address] [place] [phone] Mallie Snooks  Yes  5.2 miles  Physician/Psychiatrist  [address] [place], [place] [address] [place] [phone] Mayme Genta  Yes  6.0 miles  Physician/Psychiatrist [place]  [place], [place] [address] [place] [phone] Norma Fredrickson  Yes  6.0 miles  Physician/Psychiatrist [place], [place] [address] [place] [phone] Annitta Jersey  Yes  6.0 miles  Physician/Psychiatrist  [place], [place] [address] [place] [phone] Pearson Grippe  Yes  6.0 miles  Physician/Psychiatrist  [place], [place] [address] [place] [phone] Camie Patience  Yes  6.1 miles  Physician/Psychiatrist  [place], [place] [address] [place] [phone] Ambrose [place]  Yes  6.3 miles Physician/Psychiatrist  [address] [place], [place] [address] [place] [phone] Carlton Adam  Yes  6.3 miles  Physician/Psychiatrist  [address] [place], [place] [address] [place] [phone] Ambrose [place]  Yes  6.7 miles Physician/Psychiatrist  [place] [address] [place], [place] [address] [place] [phone] [place], [place]  Yes  7.9 miles  Physician/Psychiatrist [place], [place] [address] [place] [phone]

## 2014-07-28 NOTE — ED Notes (Addendum)
Patient states he is not taking any of his home psych meds because they aren't working.  Patient states he does not have a psychiatrist and would like to establish a relationship with one.  Patient states he is having visual and auditory hallucinations.  Patient also wants to get started on medications "that work."  Patient is alert and oriented to person, place, time and events.  Patient currently denies SI/HI.

## 2014-07-28 NOTE — BH Assessment (Addendum)
Tele Assessment Note   [name] is an 43 y.o. male that was assessed this day by this clinician via tele assessment.  Pt presented to [facility] reporting he has been off of his psychotropic medications for one month.  Pt has a hx of Schizoaffective Disorder and reported he stopped taking his prescribed Geodon, "because it was making things worse."  However, he reports he is still taking his Xanax as prescribed.  Pt reports he is hearing voices, seeing LED lights, seeing the voices projected on his computer.  He reported he thinks he has been cloned and that the voices from his clone are "interactive with the voices in my head."  Pt denies SI or HI.  He denies depressive sx or anxiety.  Pt denies SA.  He stated he has not been sleeping or had much of an appetite. Pt was cooperative, oriented x 3, had rapid, pressured and tangential speech, coherent thought processes, fair eye contact, and appeared restless.  He was pleasant and cooperative.  Pt lives alone and his mother is supportive.  She is the one that brought the pt to ED.  Pt has hx of inpatient and outpatient treatment through [facility] for med mgnt.  He has been hospitalized in past for suicide attempts and psychosis.  Inpatient treatment is recommended for the patient for stabilization of sx.  Consulted with Darrick Penna, NP at [facility] @ 1200 who recommended pt be seen by psychiatry in ED for further evaluation.  Per Waylan Boga, NP, inpatient treatment recommended.  TTS to seek placement.  Notified ED and TTS staff.  Axis I: 295.70 Schizoaffective Disorder Axis II: Deferred Axis III:  Past Medical History  Diagnosis Date  . Migraines   . Schizophrenia, schizo-affective   . Chronic back pain   . Knee pain, chronic    Axis IV: other psychosocial or environmental problems Axis V: 21-30 behavior considerably influenced by delusions or hallucinations OR serious impairment in judgment, communication OR inability to function in almost all areas  Past  Medical History:  Past Medical History  Diagnosis Date  . Migraines   . Schizophrenia, schizo-affective   . Chronic back pain   . Knee pain, chronic     Past Surgical History  Procedure Laterality Date  . Knee surgery      four times  . Extraction of wisdom teeth      Family History: History reviewed. No pertinent family history.  Social History:  reports that he has been smoking Cigarettes.  He has a 30 pack-year smoking history. He does not have any smokeless tobacco history on file. He reports that he does not drink alcohol or use illicit drugs.  Additional Social History:  Alcohol / Drug Use Pain Medications: see med list Prescriptions: see med list Over the Counter: see med list History of alcohol / drug use?: No history of alcohol / drug abuse Longest period of sobriety (when/how long):  (na - pt denies) Negative Consequences of Use:  (na) Withdrawal Symptoms:  (na)  CIWA: CIWA-Ar BP: 125/81 mmHg Pulse Rate: 87 COWS:    PATIENT STRENGTHS: (choose at least two) Ability for insight Average or above average intelligence General fund of knowledge Supportive family/friends  Allergies:  Allergies  Allergen Reactions  . Nsaids Other (See Comments)    Rectal bleeding  . Quetiapine Other (See Comments)    Psychosis with high dose (600mg )    Home Medications:  (Not in a hospital admission)  OB/GYN Status:  No LMP for male  patient.  General Assessment Data [facility]: [facility] Is this a Tele or Face-to-Face Assessment?: Tele Assessment Is this an Initial Assessment or a Re-assessment for this encounter?: Initial Assessment Living Arrangements: Alone Can pt return to current living arrangement?: Yes Admission Status: Voluntary Is patient capable of signing voluntary admission?: Yes Transfer from: [facility] Referral Source: Self/Family/Friend     [place] Living Arrangements: Alone Name of Psychiatrist: Beverly Sessions Name of  Therapist: [facility]  Education Status Is patient currently in school?: No  Risk to self with the past 6 months Suicidal Ideation: No Suicidal Intent: No Is patient at risk for suicide?: No Suicidal Plan?: No Access to Means: No What has been your use of drugs/alcohol within the last 12 months?: na - pt denies Previous Attempts/Gestures: Yes How many times?:  (multiple - last in 02/2014 by overdose) Other Self Harm Risks: na - pt denies Triggers for Past Attempts: Hallucinations Intentional Self Injurious Behavior: None Family Suicide History: Unknown Recent stressful life event(s): Other (Comment) (Decompensation, off meds) Persecutory voices/beliefs?: No Depression: No Depression Symptoms:  (pt denies depressive sx) Substance abuse history and/or treatment for substance abuse?: No Suicide prevention information given to non-admitted patients: Not applicable  Risk to Others within the past 6 months Homicidal Ideation: No Thoughts of Harm to Others: No Current Homicidal Intent: No Current Homicidal Plan: No Access to Homicidal Means: No Identified Victim: na - pt denies History of harm to others?: No Assessment of Violence: None Noted Violent Behavior Description: na - pt cooperative Does patient have access to weapons?: No Criminal Charges Pending?: No Does patient have a court date: No  Psychosis Hallucinations: Auditory, Visual (seeing LED lights, hearing voices, seeing them on computer) Delusions: Unspecified (Believes he is cloned)  Mental Status Report Appear/Hygiene: Disheveled, In scrubs Eye Contact: Fair Motor Activity: Restlessness Speech: Tangential, Rapid, Pressured Level of Consciousness: Alert Mood: Euthymic Affect: Other (Comment) (Euthymic) Anxiety Level: Minimal Thought Processes: Tangential Judgement: Impaired Orientation: Person, Place, Situation Obsessive Compulsive Thoughts/Behaviors: None  Cognitive Functioning Concentration:  Decreased Memory: Recent Impaired, Remote Impaired IQ: Average Insight: Fair Impulse Control: Fair Appetite: Fair Weight Loss: 0 Weight Gain: 0 Sleep: Decreased Total Hours of Sleep:  (reports has been unable to sleep) Vegetative Symptoms: None  ADLScreening [facility] Assessment Services) Patient's cognitive ability adequate to safely complete daily activities?: Yes Patient able to express need for assistance with ADLs?: Yes Independently performs ADLs?: Yes (appropriate for developmental age)  Prior Inpatient Therapy Prior Inpatient Therapy: Yes Prior Therapy Dates: 2015 and multiple previous dates Prior Therapy Facilty/Provider(s): [facility], [facility] Reason for Treatment: Psychosis  Prior Outpatient Therapy Prior Outpatient Therapy: Yes Prior Therapy Dates: Current Prior Therapy Facilty/Provider(s): [facility] Reason for Treatment: med mgnt  ADL Screening (condition at time of admission) Patient's cognitive ability adequate to safely complete daily activities?: Yes Is the patient deaf or have difficulty hearing?: No Does the patient have difficulty seeing, even when wearing glasses/contacts?: No Does the patient have difficulty concentrating, remembering, or making decisions?: No Patient able to express need for assistance with ADLs?: Yes Does the patient have difficulty dressing or bathing?: No Independently performs ADLs?: Yes (appropriate for developmental age) Does the patient have difficulty walking or climbing stairs?: No  Home Assistive Devices/Equipment Home Assistive Devices/Equipment: None    Abuse/Neglect Assessment (Assessment to be complete while patient is alone) Physical Abuse: Denies Verbal Abuse: Denies Sexual Abuse: Denies Exploitation of patient/patient's resources: Denies Self-Neglect: Denies Values / Beliefs Cultural Requests During  Hospitalization: None Spiritual Requests During Hospitalization: None Consults Spiritual Care Consult  Needed: No Social Work Consult Needed: No Regulatory affairs officer (For Healthcare) Does patient have an advance directive?: No Would patient like information on creating an advanced directive?: No - patient declined information    Additional Information 1:1 In Past 12 Months?: No CIRT Risk: No Elopement Risk: No Does patient have medical clearance?: Yes     Disposition:  Disposition Initial Assessment Completed for this Encounter: Yes Disposition of Patient: Other dispositions Other disposition(s): Other (Comment) (Pt to be seen by psychiatry for further evaluation)  Shaune Pascal, [place], [facility] Licensed Professional Counselor Therapeutic Triage Specialist [facility] Phone: [phone] Fax: [phone]  07/28/2014 12:17 PM

## 2014-07-29 ENCOUNTER — Encounter (HOSPITAL_COMMUNITY): Payer: Self-pay | Admitting: Psychiatry

## 2014-07-29 DIAGNOSIS — R443 Hallucinations, unspecified: Secondary | ICD-10-CM | POA: Diagnosis present

## 2014-07-29 DIAGNOSIS — F258 Other schizoaffective disorders: Secondary | ICD-10-CM

## 2014-07-29 DIAGNOSIS — R45851 Suicidal ideations: Secondary | ICD-10-CM

## 2014-07-29 DIAGNOSIS — F259 Schizoaffective disorder, unspecified: Secondary | ICD-10-CM | POA: Diagnosis present

## 2014-07-29 MED ORDER — ALPRAZOLAM 0.5 MG PO TABS
1.0000 mg | ORAL_TABLET | Freq: Once | ORAL | Status: AC
  Administered 2014-07-29: 1 mg via ORAL
  Filled 2014-07-29: qty 2

## 2014-07-29 MED ORDER — ALPRAZOLAM 0.5 MG PO TABS
0.5000 mg | ORAL_TABLET | Freq: Once | ORAL | Status: AC
  Administered 2014-07-29: 0.5 mg via ORAL
  Filled 2014-07-29: qty 1

## 2014-07-29 NOTE — ED Notes (Addendum)
Sitter at bedside.

## 2014-07-29 NOTE — ED Notes (Signed)
Request medications to assist him to go to sleep. States he is hearing voices.

## 2014-07-29 NOTE — ED Notes (Signed)
sitter at bedside.

## 2014-07-29 NOTE — Consult Note (Signed)
[facility] Face-to-Face Psychiatry Consult   Reason for Consult:  Hallucinations Referring Physician:  EDP  [place] [name] is an 42 y.o. male. Total Time spent with patient: 45 minutes  Assessment: AXIS I:  Schizoaffective Disorder AXIS II:  Deferred AXIS III:   Past Medical History  Diagnosis Date  . Migraines   . Schizophrenia, schizo-affective   . Chronic back pain   . Knee pain, chronic    AXIS IV:  other psychosocial or environmental problems, problems related to social environment and problems with primary support group AXIS V:  21-30 behavior considerably influenced by delusions or hallucinations OR serious impairment in judgment, communication OR inability to function in almost all areas  Plan:  Recommend psychiatric Inpatient admission when medically cleared.  Subjective:   [name] is a 42 y.o. male patient admitted with exacerbation of schizoaffective disorder.  HPI:  Patient has been having hallucinations telling him to hurt himself at times.  I spoke with his mother at length yesterday who stated he has been unstable for a month with frequent threats to kill himself.  He overdosed in August and was sent home from the ED.  [name] has been going down hill since this time.  He has been to [facility] with long waits and minimal care.  The ACT team there was suppose to come see him but the person was in a car accident.  His mother is trying desperately to get him help.  He will often times get inpatient and leave before help is given.  Visual and auditory hallucinations are present, currently denies suicidal/homicidal ideations, no drug or alcohol abuse at this time.  The mother does state the last time he was at [facility] the medications made him too groggy and he was having difficulty functioning.  She felt he was over medicated. HPI Elements:   Location:  generalized. Quality:  acute. Severity:  severe. Timing:  constant. Duration:  month. Context:  medication changes.  Past  Psychiatric History: Past Medical History  Diagnosis Date  . Migraines   . Schizophrenia, schizo-affective   . Chronic back pain   . Knee pain, chronic     reports that he has been smoking Cigarettes.  He has a 30 pack-year smoking history. He does not have any smokeless tobacco history on file. He reports that he does not drink alcohol or use illicit drugs. History reviewed. No pertinent family history. Family History Substance Abuse: No Family Supports: Yes, List: (mother) Living Arrangements: Alone Can pt return to current living arrangement?: Yes Abuse/Neglect ([facility]) Physical Abuse: Denies Verbal Abuse: Denies Sexual Abuse: Denies Allergies:   Allergies  Allergen Reactions  . Nsaids Other (See Comments)    Rectal bleeding  . Quetiapine Other (See Comments)    Psychosis with high dose (600mg)    ACT Assessment Complete:  Yes:    Educational Status    Risk to Self: Risk to self with the past 6 months Suicidal Ideation: No Suicidal Intent: No Is patient at risk for suicide?: No Suicidal Plan?: No Access to Means: No What has been your use of drugs/alcohol within the last 12 months?: na - pt denies Previous Attempts/Gestures: Yes How many times?:  (multiple - last in 02/2014 by overdose) Other Self Harm Risks: na - pt denies Triggers for Past Attempts: Hallucinations Intentional Self Injurious Behavior: None Family Suicide History: Unknown Recent stressful life event(s): Other (Comment) (Decompensation, off meds) Persecutory voices/beliefs?: No Depression: No Depression Symptoms:  (pt denies depressive sx) Substance abuse   history and/or treatment for substance abuse?: No Suicide prevention information given to non-admitted patients: Not applicable  Risk to Others: Risk to Others within the past 6 months Homicidal Ideation: No Thoughts of Harm to Others: No Current Homicidal Intent: No Current Homicidal Plan: No Access to Homicidal Means: No Identified Victim: na -  pt denies History of harm to others?: No Assessment of Violence: None Noted Violent Behavior Description: na - pt cooperative Does patient have access to weapons?: No Criminal Charges Pending?: No Does patient have a court date: No  Abuse: Abuse/Neglect Assessment (Assessment to be complete while patient is alone) Physical Abuse: Denies Verbal Abuse: Denies Sexual Abuse: Denies Exploitation of patient/patient's resources: Denies Self-Neglect: Denies  Prior Inpatient Therapy: Prior Inpatient Therapy Prior Inpatient Therapy: Yes Prior Therapy Dates: 2015 and multiple previous dates Prior Therapy Facilty/Provider(s): [facility], [facility], [facility] Reason for Treatment: Psychosis  Prior Outpatient Therapy: Prior Outpatient Therapy Prior Outpatient Therapy: Yes Prior Therapy Dates: Current Prior Therapy Facilty/Provider(s): [facility] Reason for Treatment: med mgnt  Additional Information: Additional Information 1:1 In Past 12 Months?: No CIRT Risk: No Elopement Risk: No Does patient have medical clearance?: Yes                  Objective: Blood pressure 106/73, pulse 60, temperature 98.6 F (37 C), temperature source Oral, resp. rate 18, SpO2 96 %.There is no weight on file to calculate BMI. Results for orders placed or performed during the hospital encounter of 07/28/14 (from the past 72 hour(s))  CBC WITH DIFFERENTIAL     Status: None   Collection Time: 07/28/14 11:04 AM  Result Value Ref Range   WBC 8.4 4.0 - 10.5 K/uL   RBC 5.10 4.22 - 5.81 MIL/uL   Hemoglobin 15.5 13.0 - 17.0 g/dL   HCT 45.1 39.0 - 52.0 %   MCV 88.4 78.0 - 100.0 fL   MCH 30.4 26.0 - 34.0 pg   MCHC 34.4 30.0 - 36.0 g/dL   RDW 12.8 11.5 - 15.5 %   Platelets 175 150 - 400 K/uL   Neutrophils Relative % 56 43 - 77 %   Neutro Abs 4.8 1.7 - 7.7 K/uL   Lymphocytes Relative 34 12 - 46 %   Lymphs Abs 2.8 0.7 - 4.0 K/uL   Monocytes Relative 8 3 - 12 %   Monocytes Absolute 0.7 0.1 - 1.0 K/uL    Eosinophils Relative 1 0 - 5 %   Eosinophils Absolute 0.1 0.0 - 0.7 K/uL   Basophils Relative 1 0 - 1 %   Basophils Absolute 0.1 0.0 - 0.1 K/uL  Comprehensive metabolic panel     Status: Abnormal   Collection Time: 07/28/14 11:04 AM  Result Value Ref Range   Sodium 136 (L) 137 - 147 mEq/L   Potassium 3.3 (L) 3.7 - 5.3 mEq/L   Chloride 97 96 - 112 mEq/L   CO2 26 19 - 32 mEq/L   Glucose, Bld 109 (H) 70 - 99 mg/dL   BUN 3 (L) 6 - 23 mg/dL   Creatinine, Ser 0.94 0.50 - 1.35 mg/dL   Calcium 9.5 8.4 - 10.5 mg/dL   Total Protein 7.5 6.0 - 8.3 g/dL   Albumin 4.1 3.5 - 5.2 g/dL   AST 29 0 - 37 U/L   ALT 46 0 - 53 U/L   Alkaline Phosphatase 61 39 - 117 U/L   Total Bilirubin 0.4 0.3 - 1.2 mg/dL   GFR calc non Af Amer >90 >90 mL/min     GFR calc Af Amer >90 >90 mL/min    Comment: (NOTE) The eGFR has been calculated using the CKD EPI equation. This calculation has not been validated in all clinical situations. eGFR's persistently <90 mL/min signify possible Chronic Kidney Disease.    Anion gap 13 5 - 15  Drug screen panel, emergency     Status: None   Collection Time: 07/28/14 11:04 AM  Result Value Ref Range   Opiates NONE DETECTED NONE DETECTED   Cocaine NONE DETECTED NONE DETECTED   Benzodiazepines NONE DETECTED NONE DETECTED   Amphetamines NONE DETECTED NONE DETECTED   Tetrahydrocannabinol NONE DETECTED NONE DETECTED   Barbiturates NONE DETECTED NONE DETECTED    Comment:        DRUG SCREEN FOR MEDICAL PURPOSES ONLY.  IF CONFIRMATION IS NEEDED FOR ANY PURPOSE, NOTIFY LAB WITHIN 5 DAYS.        LOWEST DETECTABLE LIMITS FOR URINE DRUG SCREEN Drug Class       Cutoff (ng/mL) Amphetamine      1000 Barbiturate      200 Benzodiazepine   211 Tricyclics       941 Opiates          300 Cocaine          300 THC              50   Ethanol     Status: None   Collection Time: 07/28/14 11:04 AM  Result Value Ref Range   Alcohol, Ethyl (B) <11 0 - 11 mg/dL    Comment:        LOWEST  DETECTABLE LIMIT FOR SERUM ALCOHOL IS 11 mg/dL FOR MEDICAL PURPOSES ONLY    Labs are reviewed and are pertinent for no medical issues.  Current Facility-Administered Medications  Medication Dose Route Frequency Provider Last Rate Last Dose  . acetaminophen (TYLENOL) tablet 650 mg  650 mg Oral Q4H PRN Heather Laisure, PA-C      . alum & mag hydroxide-simeth (MAALOX/MYLANTA) 200-200-20 MG/5ML suspension 30 mL  30 mL Oral PRN Heather Laisure, PA-C      . hydrOXYzine (ATARAX/VISTARIL) tablet 25 mg  25 mg Oral TID Waylan Boga, NP   25 mg at 07/29/14 1041  . nicotine (NICODERM CQ - dosed in mg/24 hours) patch 21 mg  21 mg Transdermal Daily Hyman Bible, PA-C   Stopped at 07/28/14 1255  . ondansetron (ZOFRAN) tablet 4 mg  4 mg Oral Q8H PRN Heather Laisure, PA-C      . pantoprazole (PROTONIX) EC tablet 40 mg  40 mg Oral Daily Waylan Boga, NP   40 mg at 07/29/14 1041  . potassium chloride SA (K-DUR,KLOR-CON) CR tablet 20 mEq  20 mEq Oral Daily Waylan Boga, NP   20 mEq at 07/28/14 1718  . ziprasidone (GEODON) capsule 20 mg  20 mg Oral BID WC Waylan Boga, NP   20 mg at 07/29/14 0906  . zolpidem (AMBIEN) tablet 5 mg  5 mg Oral QHS PRN Mirna Mires, MD   5 mg at 07/29/14 0005   Current Outpatient Prescriptions  Medication Sig Dispense Refill  . omeprazole (PRILOSEC) 40 MG capsule Take 1 capsule (40 mg total) by mouth 2 (two) times daily. (Patient taking differently: Take 40 mg by mouth daily. )    . amantadine (SYMMETREL) 100 MG capsule Take 1 capsule (100 mg total) by mouth 2 (two) times daily. (Patient not taking: Reported on 07/28/2014) 60 capsule 0  . hydrOXYzine (ATARAX/VISTARIL) 25 MG tablet Take  1 tablet (25 mg total) by mouth 3 (three) times daily. (Patient not taking: Reported on 07/28/2014) 30 tablet 0  . ziprasidone (GEODON) 40 MG capsule Take 1 capsule (40 mg total) by mouth 2 (two) times daily with a meal. (Patient not taking: Reported on 07/28/2014) 60 capsule 0    Psychiatric  Specialty Exam:     Blood pressure 106/73, pulse 60, temperature 98.6 F (37 C), temperature source Oral, resp. rate 18, SpO2 96 %.There is no weight on file to calculate BMI.  General Appearance: Disheveled  Eye Contact::  Fair  Speech:  Normal Rate  Volume:  Normal  Mood:  Depressed  Affect:  Blunt  Thought Process:  Coherent  Orientation:  Full (Time, Place, and Person)  Thought Content:  Hallucinations: Auditory Visual  Suicidal Thoughts:  Yes.  without intent/plan  Homicidal Thoughts:  No  Memory:  Immediate;   Fair Recent;   Fair Remote;   Fair  Judgement:  Impaired  Insight:  Fair  Psychomotor Activity:  Decreased  Concentration:  Fair  Recall:  Fair  Fund of Knowledge:Fair  Language: Fair  Akathisia:  No  Handed:  Right  AIMS (if indicated):     Assets:  Housing Leisure Time Physical Health Resilience Social Support  Sleep:      Musculoskeletal: Strength & Muscle Tone: within normal limits Gait & Station: normal Patient leans: N/A  Treatment Plan Summary: Daily contact with patient to assess and evaluate symptoms and progress in treatment Medication management; admit inpatient for stabilization  , , PMH-NP 07/29/2014 07/29/2014 12:34 PM 

## 2014-07-30 ENCOUNTER — Encounter (HOSPITAL_COMMUNITY): Payer: Self-pay | Admitting: Registered Nurse

## 2014-07-30 DIAGNOSIS — F258 Other schizoaffective disorders: Secondary | ICD-10-CM | POA: Diagnosis not present

## 2014-07-30 DIAGNOSIS — F250 Schizoaffective disorder, bipolar type: Secondary | ICD-10-CM

## 2014-07-30 MED ORDER — ALPRAZOLAM 0.5 MG PO TABS
1.0000 mg | ORAL_TABLET | Freq: Once | ORAL | Status: AC
Start: 2014-07-30 — End: 2014-07-30
  Administered 2014-07-30: 1 mg via ORAL
  Filled 2014-07-30: qty 2

## 2014-07-30 MED ORDER — ALPRAZOLAM 1 MG PO TABS
ORAL_TABLET | ORAL | Status: AC
  Filled 2014-07-30: qty 1

## 2014-07-30 NOTE — Consult Note (Signed)
[facility] Face-to-Face Psychiatry Consult   Reason for Consult:  Hallucinations Referring Physician:  EDP  Mali Juvencio Verdi is an 43 y.o. male. Total Time spent with patient: 45 minutes  Assessment: AXIS I:  Schizoaffective Disorder AXIS II:  Deferred AXIS III:   Past Medical History  Diagnosis Date  . Migraines   . Schizophrenia, schizo-affective   . Chronic back pain   . Knee pain, chronic    AXIS IV:  other psychosocial or environmental problems, problems related to social environment and problems with primary support group AXIS V:  21-30 behavior considerably influenced by delusions or hallucinations OR serious impairment in judgment, communication OR inability to function in almost all areas  Plan:  Recommend psychiatric Inpatient admission when medically cleared.  Subjective:   Mali Acea Yagi is a 43 y.o. male patient admitted with exacerbation of schizoaffective disorder.  HPI:  Patient continues to endorse hallucinations and deny suicidal/homicidal ideation at this time.  Will continue with current plan for inpatient treatment for stabilization.    HPI Elements:   Location:  generalized. Quality:  acute. Severity:  severe. Timing:  constant. Duration:  month. Context:  medication changes.  Past Psychiatric History: Past Medical History  Diagnosis Date  . Migraines   . Schizophrenia, schizo-affective   . Chronic back pain   . Knee pain, chronic     reports that he has been smoking Cigarettes.  He has a 30 pack-year smoking history. He does not have any smokeless tobacco history on file. He reports that he does not drink alcohol or use illicit drugs. History reviewed. No pertinent family history. Family History Substance Abuse: No Family Supports: Yes, List: (mother) Living Arrangements: Alone Can pt return to current living arrangement?: Yes Abuse/Neglect [facility]) Physical Abuse: Denies Verbal Abuse: Denies Sexual Abuse: Denies Allergies:   Allergies   Allergen Reactions  . Nsaids Other (See Comments)    Rectal bleeding  . Quetiapine Other (See Comments)    Psychosis with high dose (675m)    ACT Assessment Complete:  Yes:    Educational Status    Risk to Self: Risk to self with the past 6 months Suicidal Ideation: No Suicidal Intent: No Is patient at risk for suicide?: No Suicidal Plan?: No Access to Means: No What has been your use of drugs/alcohol within the last 12 months?: na - pt denies Previous Attempts/Gestures: Yes How many times?:  (multiple - last in 02/2014 by overdose) Other Self Harm Risks: na - pt denies Triggers for Past Attempts: Hallucinations Intentional Self Injurious Behavior: None Family Suicide History: Unknown Recent stressful life event(s): Other (Comment) (Decompensation, off meds) Persecutory voices/beliefs?: No Depression: No Depression Symptoms:  (pt denies depressive sx) Substance abuse history and/or treatment for substance abuse?: No Suicide prevention information given to non-admitted patients: Not applicable  Risk to Others: Risk to Others within the past 6 months Homicidal Ideation: No Thoughts of Harm to Others: No Current Homicidal Intent: No Current Homicidal Plan: No Access to Homicidal Means: No Identified Victim: na - pt denies History of harm to others?: No Assessment of Violence: None Noted Violent Behavior Description: na - pt cooperative Does patient have access to weapons?: No Criminal Charges Pending?: No Does patient have a court date: No  Abuse: Abuse/Neglect Assessment (Assessment to be complete while patient is alone) Physical Abuse: Denies Verbal Abuse: Denies Sexual Abuse: Denies Exploitation of patient/patient's resources: Denies Self-Neglect: Denies  Prior Inpatient Therapy: Prior Inpatient Therapy Prior Inpatient Therapy: Yes Prior Therapy Dates: 265and  multiple previous dates Prior Therapy Facilty/Provider(s): [place], [facility] Reason for  Treatment: Psychosis  Prior Outpatient Therapy: Prior Outpatient Therapy Prior Outpatient Therapy: Yes Prior Therapy Dates: Current Prior Therapy Facilty/Provider(s): [facility] Reason for Treatment: med mgnt  Additional Information: Additional Information 1:1 In Past 12 Months?: No CIRT Risk: No Elopement Risk: No Does patient have medical clearance?: Yes       Objective: Blood pressure 132/89, pulse 57, temperature 98 F (36.7 C), temperature source Oral, resp. rate 18, SpO2 95 %.There is no weight on file to calculate BMI. Results for orders placed or performed during the hospital encounter of 07/28/14 (from the past 72 hour(s))  CBC WITH DIFFERENTIAL     Status: None   Collection Time: 07/28/14 11:04 AM  Result Value Ref Range   WBC 8.4 4.0 - 10.5 K/uL   RBC 5.10 4.22 - 5.81 MIL/uL   Hemoglobin 15.5 13.0 - 17.0 g/dL   HCT 45.1 39.0 - 52.0 %   MCV 88.4 78.0 - 100.0 fL   MCH 30.4 26.0 - 34.0 pg   MCHC 34.4 30.0 - 36.0 g/dL   RDW 12.8 11.5 - 15.5 %   Platelets 175 150 - 400 K/uL   Neutrophils Relative % 56 43 - 77 %   Neutro Abs 4.8 1.7 - 7.7 K/uL   Lymphocytes Relative 34 12 - 46 %   Lymphs Abs 2.8 0.7 - 4.0 K/uL   Monocytes Relative 8 3 - 12 %   Monocytes Absolute 0.7 0.1 - 1.0 K/uL   Eosinophils Relative 1 0 - 5 %   Eosinophils Absolute 0.1 0.0 - 0.7 K/uL   Basophils Relative 1 0 - 1 %   Basophils Absolute 0.1 0.0 - 0.1 K/uL  Comprehensive metabolic panel     Status: Abnormal   Collection Time: 07/28/14 11:04 AM  Result Value Ref Range   Sodium 136 (L) 137 - 147 mEq/L   Potassium 3.3 (L) 3.7 - 5.3 mEq/L   Chloride 97 96 - 112 mEq/L   CO2 26 19 - 32 mEq/L   Glucose, Bld 109 (H) 70 - 99 mg/dL   BUN 3 (L) 6 - 23 mg/dL   Creatinine, Ser 0.94 0.50 - 1.35 mg/dL   Calcium 9.5 8.4 - 10.5 mg/dL   Total Protein 7.5 6.0 - 8.3 g/dL   Albumin 4.1 3.5 - 5.2 g/dL   AST 29 0 - 37 U/L   ALT 46 0 - 53 U/L   Alkaline Phosphatase 61 39 - 117 U/L   Total Bilirubin 0.4 0.3 - 1.2  mg/dL   GFR calc non Af Amer >90 >90 mL/min   GFR calc Af Amer >90 >90 mL/min    Comment: (NOTE) The eGFR has been calculated using the CKD EPI equation. This calculation has not been validated in all clinical situations. eGFR's persistently <90 mL/min signify possible Chronic Kidney Disease.    Anion gap 13 5 - 15  Drug screen panel, emergency     Status: None   Collection Time: 07/28/14 11:04 AM  Result Value Ref Range   Opiates NONE DETECTED NONE DETECTED   Cocaine NONE DETECTED NONE DETECTED   Benzodiazepines NONE DETECTED NONE DETECTED   Amphetamines NONE DETECTED NONE DETECTED   Tetrahydrocannabinol NONE DETECTED NONE DETECTED   Barbiturates NONE DETECTED NONE DETECTED    Comment:        DRUG SCREEN FOR MEDICAL PURPOSES ONLY.  IF CONFIRMATION IS NEEDED FOR ANY PURPOSE, NOTIFY LAB WITHIN 5 DAYS.  LOWEST DETECTABLE LIMITS FOR URINE DRUG SCREEN Drug Class       Cutoff (ng/mL) Amphetamine      1000 Barbiturate      200 Benzodiazepine   867 Tricyclics       619 Opiates          300 Cocaine          300 THC              50   Ethanol     Status: None   Collection Time: 07/28/14 11:04 AM  Result Value Ref Range   Alcohol, Ethyl (B) <11 0 - 11 mg/dL    Comment:        LOWEST DETECTABLE LIMIT FOR SERUM ALCOHOL IS 11 mg/dL FOR MEDICAL PURPOSES ONLY    Labs are reviewed and are pertinent for no medical issues.  Current Facility-Administered Medications  Medication Dose Route Frequency Provider Last Rate Last Dose  . acetaminophen (TYLENOL) tablet 650 mg  650 mg Oral Q4H PRN Hyman Bible, PA-C   650 mg at 07/30/14 0626  . alum & mag hydroxide-simeth (MAALOX/MYLANTA) 200-200-20 MG/5ML suspension 30 mL  30 mL Oral PRN Hyman Bible, PA-C      . hydrOXYzine (ATARAX/VISTARIL) tablet 25 mg  25 mg Oral TID Waylan Boga, NP   25 mg at 07/30/14 0830  . nicotine (NICODERM CQ - dosed in mg/24 hours) patch 21 mg  21 mg Transdermal Daily Hyman Bible, PA-C   Stopped at  07/28/14 1255  . ondansetron (ZOFRAN) tablet 4 mg  4 mg Oral Q8H PRN Heather Laisure, PA-C      . pantoprazole (PROTONIX) EC tablet 40 mg  40 mg Oral Daily Waylan Boga, NP   40 mg at 07/30/14 0829  . potassium chloride SA (K-DUR,KLOR-CON) CR tablet 20 mEq  20 mEq Oral Daily Waylan Boga, NP   20 mEq at 07/28/14 1718  . ziprasidone (GEODON) capsule 20 mg  20 mg Oral BID WC Waylan Boga, NP   20 mg at 07/30/14 5093  . zolpidem (AMBIEN) tablet 5 mg  5 mg Oral QHS PRN Mirna Mires, MD   5 mg at 07/29/14 2149   Current Outpatient Prescriptions  Medication Sig Dispense Refill  . omeprazole (PRILOSEC) 40 MG capsule Take 1 capsule (40 mg total) by mouth 2 (two) times daily. (Patient taking differently: Take 40 mg by mouth daily. )    . amantadine (SYMMETREL) 100 MG capsule Take 1 capsule (100 mg total) by mouth 2 (two) times daily. (Patient not taking: Reported on 07/28/2014) 60 capsule 0  . hydrOXYzine (ATARAX/VISTARIL) 25 MG tablet Take 1 tablet (25 mg total) by mouth 3 (three) times daily. (Patient not taking: Reported on 07/28/2014) 30 tablet 0  . ziprasidone (GEODON) 40 MG capsule Take 1 capsule (40 mg total) by mouth 2 (two) times daily with a meal. (Patient not taking: Reported on 07/28/2014) 60 capsule 0    Psychiatric Specialty Exam:     Blood pressure 132/89, pulse 57, temperature 98 F (36.7 C), temperature source Oral, resp. rate 18, SpO2 95 %.There is no weight on file to calculate BMI.  General Appearance: Disheveled  Eye Sport and exercise psychologist::  Fair  Speech:  Normal Rate  Volume:  Normal  Mood:  Depressed  Affect:  Blunt  Thought Process:  Coherent  Orientation:  Full (Time, Place, and Person)  Thought Content:  Hallucinations: Auditory Visual  Suicidal Thoughts:  Yes.  without intent/plan  Denies at this time  Homicidal  Thoughts:  No  Memory:  Immediate;   Fair Recent;   Fair Remote;   Fair  Judgement:  Impaired  Insight:  Fair  Psychomotor Activity:  Decreased  Concentration:   Fair  Recall:  [employer] [place]: Fair  Akathisia:  No  Handed:  Right  AIMS (if indicated):     Assets:  Housing Leisure Time Physical Health Resilience Social Support  Sleep:      Musculoskeletal: Strength & Muscle Tone: within normal limits Gait & Station: normal Patient leans: N/A  Treatment Plan Summary: Daily contact with patient to assess and evaluate symptoms and progress in treatment Medication management; admit inpatient for stabilization  Continue with current plan for inpatient treatment and stabilization   Celinda Dethlefs, FNP-BC 07/30/2014 1:27 PM

## 2014-07-30 NOTE — ED Notes (Signed)
Patient is resting comfortably. Pt watching TV.

## 2014-07-30 NOTE — BH Assessment (Signed)
[place] Assessment Progress Note Accepting referrals: [facility] have male, male beds. No MCD over age of 79, no SP. [facility] a few low acuity beds [place]--[facility], they have adult beds-no SA [facility]-none currently, but will take referrals for possible discharges tomorrow.- [place]-per Juliann Pulse no adult beds, 1 male gero psych bed [place]-Brandi 1 bed available and several d/c for tomorrow [place]-[facility]-have 2 beds but about to fill them with referrals At capacity: [facility]-No beds per [place] waitlist [place]-[facility]athleen none today, will have discharges tomorrow [place]-[facility]-at capacity, will take referrals tomorrow [facility] beds, but per [facility] will be adult d/c's tomorrow, per Lattie Haw accepting children to w. list [place]-[facility] no beds, should have some tomorrow [place]-per Elmyra Ricks at [facility] available [employer] [facility] per [facility] no beds Unable to contact: [place]-unable to reach Ruby to reach

## 2014-07-30 NOTE — ED Notes (Signed)
Patient is requesting d/. Stes "I'm not suicidal or homicidal.  I just needed to get back on my meds. I'm having trouble with insurance." Reassured that he would speak with LCSW or CM after speaking with provider.

## 2014-07-30 NOTE — BH Assessment (Signed)
[place] Assessment Progress Note Per Deneise Lever, pt added to OV wait list.

## 2014-07-30 NOTE — BH Assessment (Signed)
[name], [facility] at [facility], confirmed adult unit is at capacity. Contacted the following facilities for placement:  AT CAPACITY: [place], per Archie Endo, per [facility], per [facility], per [facility], per [facility], per [facility] per [facility], per [facility], per [facility], per [facility], per [facility], per [facility], per [facility], per [facility], per [facility], per [facility], per [facility], per [facility], per [place] (no acute beds)  NO RESPONSE: [place], [place], [facility] Triage Specialist [phone]

## 2014-07-30 NOTE — ED Notes (Signed)
Patient is resting comfortably. Sitter at bedside.  

## 2014-07-30 NOTE — BH Assessment (Signed)
Per Roderic Palau at [facility] pt is accepted and can arrive anytime after 9 am 07-31-14 pending receipt of IVC (fax to [phone]) paperwork.   Pt is going to room 103 under the care of Dr. Wilber Oliphant, and report can be called to [phone].  Informed Tamika RN, who will fax IVC presently.   Lear Ng, [facility] Triage Specialist 07/30/2014 8:49 PM

## 2014-07-31 DIAGNOSIS — F258 Other schizoaffective disorders: Secondary | ICD-10-CM | POA: Diagnosis not present

## 2014-07-31 NOTE — ED Notes (Signed)
IVC papers has been faxed to [facility]. pls see TTS note to call report

## 2014-07-31 NOTE — BHH Counselor (Signed)
Per Reggie at [facility], pt going to [facility], Room 103 and accepted by Dr Wilber Oliphant. Writer updated pt's RN Luann on disposition. Otho Perl will contact Sgt Pascal at [phone] when pt ready to be transported. Writer spoke w/ Kennyth Lose at [facility] who states IVC paperwork has been accepted.  Arnold Long, [place] Assessment Counselor

## 2014-07-31 NOTE — ED Notes (Signed)
Charge RN spoke with [facility], pt to go to [facility].

## 2014-07-31 NOTE — Consult Note (Signed)
[facility] Face-to-Face Psychiatry Consult   Reason for Consult:  Hallucinations Referring Physician:  EDP  [name] is an 43 y.o. male. Total Time spent with patient: 45 minutes  Assessment: AXIS I:  Schizoaffective Disorder AXIS II:  Deferred AXIS III:   Past Medical History  Diagnosis Date  . Migraines   . Schizophrenia, schizo-affective   . Chronic back pain   . Knee pain, chronic    AXIS IV:  other psychosocial or environmental problems, problems related to social environment and problems with primary support group AXIS V:  21-30 behavior considerably influenced by delusions or hallucinations OR serious impairment in judgment, communication OR inability to function in almost all areas  Plan:  Recommend psychiatric Inpatient admission when medically cleared.  Transfer to [facility] for stabilization.  Subjective:   [name] Castin Donaghue is a 43 y.o. male patient admitted with exacerbation of schizoaffective disorder.  HPI:  Patient continues to have hallucinations.  He is very irritable today and got upset when he found out he was going to [facility].  Elberta Fortis stated it was too far away and started cursing.  Patient remains in need of stabilization   Past Psychiatric History: Past Medical History  Diagnosis Date  . Migraines   . Schizophrenia, schizo-affective   . Chronic back pain   . Knee pain, chronic     reports that he has been smoking Cigarettes.  He has a 30 pack-year smoking history. He does not have any smokeless tobacco history on file. He reports that he does not drink alcohol or use illicit drugs. History reviewed. No pertinent family history. Family History Substance Abuse: No Family Supports: Yes, List: (mother) Living Arrangements: Alone Can pt return to current living arrangement?: Yes Abuse/Neglect [facility]) Physical Abuse: Denies Verbal Abuse: Denies Sexual Abuse: Denies Allergies:   Allergies  Allergen Reactions  . Nsaids Other (See Comments)     Rectal bleeding  . Quetiapine Other (See Comments)    Psychosis with high dose (663m)    ACT Assessment Complete:  Yes:    Educational Status    Risk to Self: Risk to self with the past 6 months Suicidal Ideation: No Suicidal Intent: No Is patient at risk for suicide?: No Suicidal Plan?: No Access to Means: No What has been your use of drugs/alcohol within the last 12 months?: na - pt denies Previous Attempts/Gestures: Yes How many times?:  (multiple - last in 02/2014 by overdose) Other Self Harm Risks: na - pt denies Triggers for Past Attempts: Hallucinations Intentional Self Injurious Behavior: None Family Suicide History: Unknown Recent stressful life event(s): Other (Comment) (Decompensation, off meds) Persecutory voices/beliefs?: No Depression: No Depression Symptoms:  (pt denies depressive sx) Substance abuse history and/or treatment for substance abuse?: Yes Suicide prevention information given to non-admitted patients: Not applicable  Risk to Others: Risk to Others within the past 6 months Homicidal Ideation: No Thoughts of Harm to Others: No Current Homicidal Intent: No Current Homicidal Plan: No Access to Homicidal Means: No Identified Victim: na - pt denies History of harm to others?: No Assessment of Violence: None Noted Violent Behavior Description: na - pt cooperative Does patient have access to weapons?: No Criminal Charges Pending?: No Does patient have a court date: No  Abuse: Abuse/Neglect Assessment (Assessment to be complete while patient is alone) Physical Abuse: Denies Verbal Abuse: Denies Sexual Abuse: Denies Exploitation of patient/patient's resources: Denies Self-Neglect: Denies  Prior Inpatient Therapy: Prior Inpatient Therapy Prior Inpatient Therapy: Yes Prior Therapy Dates: 2015  and multiple previous dates Prior Therapy Facilty/Provider(s): [place] Reason for Treatment: Psychosis  Prior Outpatient Therapy: Prior  Outpatient Therapy Prior Outpatient Therapy: Yes Prior Therapy Dates: Current Prior Therapy Facilty/Provider(s): [facility] Reason for Treatment: med mgnt  Additional Information: Additional Information 1:1 In Past 12 Months?: No CIRT Risk: No Elopement Risk: No Does patient have medical clearance?: Yes                  Objective: Blood pressure 119/78, pulse 60, temperature 98 F (36.7 C), temperature source Oral, resp. rate 18, SpO2 99 %.There is no weight on file to calculate BMI. Results for orders placed or performed during the hospital encounter of 07/28/14 (from the past 72 hour(s))  CBC WITH DIFFERENTIAL     Status: None   Collection Time: 07/28/14 11:04 AM  Result Value Ref Range   WBC 8.4 4.0 - 10.5 K/uL   RBC 5.10 4.22 - 5.81 MIL/uL   Hemoglobin 15.5 13.0 - 17.0 g/dL   HCT 45.1 39.0 - 52.0 %   MCV 88.4 78.0 - 100.0 fL   MCH 30.4 26.0 - 34.0 pg   MCHC 34.4 30.0 - 36.0 g/dL   RDW 12.8 11.5 - 15.5 %   Platelets 175 150 - 400 K/uL   Neutrophils Relative % 56 43 - 77 %   Neutro Abs 4.8 1.7 - 7.7 K/uL   Lymphocytes Relative 34 12 - 46 %   Lymphs Abs 2.8 0.7 - 4.0 K/uL   Monocytes Relative 8 3 - 12 %   Monocytes Absolute 0.7 0.1 - 1.0 K/uL   Eosinophils Relative 1 0 - 5 %   Eosinophils Absolute 0.1 0.0 - 0.7 K/uL   Basophils Relative 1 0 - 1 %   Basophils Absolute 0.1 0.0 - 0.1 K/uL  Comprehensive metabolic panel     Status: Abnormal   Collection Time: 07/28/14 11:04 AM  Result Value Ref Range   Sodium 136 (L) 137 - 147 mEq/L   Potassium 3.3 (L) 3.7 - 5.3 mEq/L   Chloride 97 96 - 112 mEq/L   CO2 26 19 - 32 mEq/L   Glucose, Bld 109 (H) 70 - 99 mg/dL   BUN 3 (L) 6 - 23 mg/dL   Creatinine, Ser 0.94 0.50 - 1.35 mg/dL   Calcium 9.5 8.4 - 10.5 mg/dL   Total Protein 7.5 6.0 - 8.3 g/dL   Albumin 4.1 3.5 - 5.2 g/dL   AST 29 0 - 37 U/L   ALT 46 0 - 53 U/L   Alkaline Phosphatase 61 39 - 117 U/L   Total Bilirubin 0.4 0.3 - 1.2 mg/dL   GFR calc non Af Amer >90  >90 mL/min   GFR calc Af Amer >90 >90 mL/min    Comment: (NOTE) The eGFR has been calculated using the CKD EPI equation. This calculation has not been validated in all clinical situations. eGFR's persistently <90 mL/min signify possible Chronic Kidney Disease.    Anion gap 13 5 - 15  Drug screen panel, emergency     Status: None   Collection Time: 07/28/14 11:04 AM  Result Value Ref Range   Opiates NONE DETECTED NONE DETECTED   Cocaine NONE DETECTED NONE DETECTED   Benzodiazepines NONE DETECTED NONE DETECTED   Amphetamines NONE DETECTED NONE DETECTED   Tetrahydrocannabinol NONE DETECTED NONE DETECTED   Barbiturates NONE DETECTED NONE DETECTED    Comment:        DRUG SCREEN FOR MEDICAL PURPOSES ONLY.  IF CONFIRMATION IS NEEDED FOR ANY PURPOSE, NOTIFY LAB WITHIN 5 DAYS.        LOWEST DETECTABLE LIMITS FOR URINE DRUG SCREEN Drug Class       Cutoff (ng/mL) Amphetamine      1000 Barbiturate      200 Benzodiazepine   224 Tricyclics       825 Opiates          300 Cocaine          300 THC              50   Ethanol     Status: None   Collection Time: 07/28/14 11:04 AM  Result Value Ref Range   Alcohol, Ethyl (B) <11 0 - 11 mg/dL    Comment:        LOWEST DETECTABLE LIMIT FOR SERUM ALCOHOL IS 11 mg/dL FOR MEDICAL PURPOSES ONLY    Labs are reviewed and are pertinent for no medical issues.  Current Facility-Administered Medications  Medication Dose Route Frequency Provider Last Rate Last Dose  . acetaminophen (TYLENOL) tablet 650 mg  650 mg Oral Q4H PRN Heather Laisure, PA-C   650 mg at 07/31/14 0403  . alum & mag hydroxide-simeth (MAALOX/MYLANTA) 200-200-20 MG/5ML suspension 30 mL  30 mL Oral PRN Hyman Bible, PA-C      . hydrOXYzine (ATARAX/VISTARIL) tablet 25 mg  25 mg Oral TID Waylan Boga, NP   25 mg at 07/31/14 0804  . nicotine (NICODERM CQ - dosed in mg/24 hours) patch 21 mg  21 mg Transdermal Daily Hyman Bible, PA-C   Stopped at 07/28/14 1255  . ondansetron  (ZOFRAN) tablet 4 mg  4 mg Oral Q8H PRN Heather Laisure, PA-C      . pantoprazole (PROTONIX) EC tablet 40 mg  40 mg Oral Daily Waylan Boga, NP   40 mg at 07/31/14 0803  . ziprasidone (GEODON) capsule 20 mg  20 mg Oral BID WC Waylan Boga, NP   20 mg at 07/31/14 0037   Current Outpatient Prescriptions  Medication Sig Dispense Refill  . omeprazole (PRILOSEC) 40 MG capsule Take 1 capsule (40 mg total) by mouth 2 (two) times daily. (Patient taking differently: Take 40 mg by mouth daily. )    . amantadine (SYMMETREL) 100 MG capsule Take 1 capsule (100 mg total) by mouth 2 (two) times daily. (Patient not taking: Reported on 07/28/2014) 60 capsule 0  . hydrOXYzine (ATARAX/VISTARIL) 25 MG tablet Take 1 tablet (25 mg total) by mouth 3 (three) times daily. (Patient not taking: Reported on 07/28/2014) 30 tablet 0  . ziprasidone (GEODON) 40 MG capsule Take 1 capsule (40 mg total) by mouth 2 (two) times daily with a meal. (Patient not taking: Reported on 07/28/2014) 60 capsule 0    Psychiatric Specialty Exam:     Blood pressure 119/78, pulse 60, temperature 98 F (36.7 C), temperature source Oral, resp. rate 18, SpO2 99 %.There is no weight on file to calculate BMI.  General Appearance: Disheveled  Eye Sport and exercise psychologist::  Fair  Speech:  Normal Rate  Volume:  Normal  Mood:  Depressed  Affect:  Blunt  Thought Process:  Coherent  Orientation:  Full (Time, Place, and Person)  Thought Content:  Hallucinations: Auditory Visual  Suicidal Thoughts:  Yes.  without intent/plan  Homicidal Thoughts:  No  Memory:  Immediate;   Fair Recent;   Fair Remote;   Fair  Judgement:  Impaired  Insight:  Fair  Psychomotor Activity:  Decreased  Concentration:  Fair  Recall:  Smiley Houseman of [place]: Fair  Akathisia:  No  Handed:  Right  AIMS (if indicated):     Assets:  Housing Leisure Time Physical Health Resilience Social Support  Sleep:      Musculoskeletal: Strength & Muscle Tone: within  normal limits Gait & Station: normal Patient leans: N/A  Treatment Plan Summary: Daily contact with patient to assess and evaluate symptoms and progress in treatment Medication management; admit inpatient for stabilization at [address]  Waylan Boga, [place] 07/31/2014 10:22 AM  Patient seen, evaluated and I agree with notes by Nurse Practitioner. Corena Pilgrim, MD

## 2014-12-10 ENCOUNTER — Emergency Department (HOSPITAL_COMMUNITY)
Admission: EM | Admit: 2014-12-10 | Discharge: 2014-12-10 | Disposition: A | Discharge status: Home / Self Care | Payer: Medicaid Other | Attending: Emergency Medicine | Admitting: Emergency Medicine

## 2014-12-10 ENCOUNTER — Encounter (HOSPITAL_COMMUNITY): Payer: Self-pay

## 2014-12-10 ENCOUNTER — Emergency Department (HOSPITAL_COMMUNITY): Payer: Medicaid Other

## 2014-12-10 DIAGNOSIS — M179 Osteoarthritis of knee, unspecified: Secondary | ICD-10-CM | POA: Insufficient documentation

## 2014-12-10 DIAGNOSIS — Y998 Other external cause status: Secondary | ICD-10-CM | POA: Diagnosis not present

## 2014-12-10 DIAGNOSIS — Z720 Tobacco use: Secondary | ICD-10-CM | POA: Diagnosis not present

## 2014-12-10 DIAGNOSIS — G8929 Other chronic pain: Secondary | ICD-10-CM | POA: Diagnosis not present

## 2014-12-10 DIAGNOSIS — W228XXA Striking against or struck by other objects, initial encounter: Secondary | ICD-10-CM | POA: Insufficient documentation

## 2014-12-10 DIAGNOSIS — Y929 Unspecified place or not applicable: Secondary | ICD-10-CM | POA: Insufficient documentation

## 2014-12-10 DIAGNOSIS — M25462 Effusion, left knee: Secondary | ICD-10-CM | POA: Diagnosis not present

## 2014-12-10 DIAGNOSIS — M1712 Unilateral primary osteoarthritis, left knee: Secondary | ICD-10-CM

## 2014-12-10 DIAGNOSIS — F250 Schizoaffective disorder, bipolar type: Secondary | ICD-10-CM | POA: Diagnosis not present

## 2014-12-10 DIAGNOSIS — S8002XA Contusion of left knee, initial encounter: Secondary | ICD-10-CM | POA: Diagnosis not present

## 2014-12-10 DIAGNOSIS — M25562 Pain in left knee: Secondary | ICD-10-CM

## 2014-12-10 DIAGNOSIS — S8992XA Unspecified injury of left lower leg, initial encounter: Secondary | ICD-10-CM | POA: Diagnosis present

## 2014-12-10 DIAGNOSIS — Y939 Activity, unspecified: Secondary | ICD-10-CM | POA: Diagnosis not present

## 2014-12-10 NOTE — ED Provider Notes (Signed)
CSN: [id number]     Arrival date & time 12/10/14  1935 History  This chart was scribed for non-physician practitioner, Noland Fordyce, PA-C working with Alfonzo Beers, MD by Frederich Balding, ED scribe. This patient was seen in room TR07C/TR07C and the patient's care was started at 8:22 PM.   Chief Complaint  Patient presents with  . Knee Pain   The history is provided by the patient. No language interpreter was used.    HPI Comments: [name] is a 44 y.o. male with history of chronic knee pain who presents to the Emergency Department complaining of left knee pain with associated mild swelling that started 3 weeks ago. Pain worsened 3 days ago. He denies fall but states he has bumped it a few times on a table. Straightening his knee out worsens pain. He has seen his PCP and given Vicodin. It has provided no relief. Pt denies fever, chills, nausea, emesis. He has had knee surgery in 2008.    Past Medical History  Diagnosis Date  . Migraines   . Schizophrenia, schizo-affective   . Chronic back pain   . Knee pain, chronic    Past Surgical History  Procedure Laterality Date  . Knee surgery      four times  . Extraction of wisdom teeth     History reviewed. No pertinent family history. History  Substance Use Topics  . Smoking status: Current Every Day Smoker -- 3.00 packs/day for 10 years    Types: Cigarettes    Last Attempt to Quit: 02/26/2014  . Smokeless tobacco: Not on file  . Alcohol Use: No     Comment: occ    Review of Systems  Constitutional: Negative for fever and chills.  Gastrointestinal: Negative for nausea and vomiting.  Musculoskeletal: Positive for joint swelling and arthralgias.  All other systems reviewed and are negative.  Allergies  Nsaids and Quetiapine  Home Medications   Prior to Admission medications   Medication Sig Start Date End Date Taking? Authorizing Provider  amantadine (SYMMETREL) 100 MG capsule Take 1 capsule (100 mg total) by mouth 2  (two) times daily. Patient not taking: Reported on 07/28/2014 03/06/14   Niel Hummer, NP  hydrOXYzine (ATARAX/VISTARIL) 25 MG tablet Take 1 tablet (25 mg total) by mouth 3 (three) times daily. Patient not taking: Reported on 07/28/2014 03/06/14   Niel Hummer, NP  omeprazole (PRILOSEC) 40 MG capsule Take 1 capsule (40 mg total) by mouth 2 (two) times daily. Patient taking differently: Take 40 mg by mouth daily.  03/06/14   Niel Hummer, NP  ziprasidone (GEODON) 40 MG capsule Take 1 capsule (40 mg total) by mouth 2 (two) times daily with a meal. Patient not taking: Reported on 07/28/2014 04/30/14   Venetia Maxon Rama, MD   BP 121/84 mmHg  Pulse 81  Temp(Src) 98.2 F (36.8 C) (Oral)  Resp 18  Ht 6\' 1"  (1.854 m)  Wt 230 lb (104.327 kg)  BMI 30.35 kg/m2  SpO2 98%   Physical Exam  Constitutional: He is oriented to person, place, and time. He appears well-developed and well-nourished.  HENT:  Head: Normocephalic and atraumatic.  Eyes: EOM are normal.  Neck: Normal range of motion.  Cardiovascular: Normal rate.   Pulmonary/Chest: Effort normal.  Musculoskeletal: Normal range of motion.  Well healed surgical scar on anterior medial aspect of left knee. Mild ecchymosis to superior aspect of knee. Mild diffuse tenderness. Full ROM with mild crepitus.  Neurological: He is alert and  oriented to person, place, and time.  Skin: Skin is warm and dry. No erythema.  Skin in tact, no erythema or warmth.   Psychiatric: He has a normal mood and affect. His behavior is normal.  Nursing note and vitals reviewed.   ED Course  Procedures (including critical care time)  DIAGNOSTIC STUDIES: Oxygen Saturation is 96% on RA, normal by my interpretation.    COORDINATION OF CARE: 8:24 PM-Discussed treatment plan which includes xray with pt at bedside and pt agreed to plan.   9:20 PM-Advised pt of xray results. Will discharge home with a knee sleeve and orthopedic follow up.  Labs Review Labs Reviewed  - No data to display  Imaging Review Dg Knee Complete 4 Views Left  12/10/2014   CLINICAL DATA:  Knee pain medially.  No known injury.  EXAM: LEFT KNEE - COMPLETE 4+ VIEW  COMPARISON:  06/15/2007  FINDINGS: Medial unicompartmental arthroplasty remains well seated. Only seen on the lateral projection, there is suggestion of cystic change above the femoral component. No periprosthetic fracture. No significant joint narrowing in the native compartments, although spurring has mildly increased at the patellofemoral compartment.  IMPRESSION: 1. No acute findings. 2. Medial unicompartmental arthroplasty. A bone cyst may have developed near the femoral component, but there is no evidence of loosening. 3. Knee joint effusion.   Electronically Signed   By: Monte Fantasia M.D.   On: 12/10/2014 21:02     EKG Interpretation None      MDM   Final diagnoses:  Knee joint effusion, left  Arthritis of left knee  Left knee pain   Pt c/o exacerbation of chronic Left knee pain w/o known injury. No evidence of septic joint.  Plain films: no acute findings. Knee joint effusion. Pt has his own knee sleeve. Recommended pt use as well as elevation and ice. Pt has pain medication at home. Home care instructions provided. Advised to f/u with PCP and orthopedics. Pt verbalized understanding and agreement with tx plan.   I personally performed the services described in this documentation, which was scribed in my presence. The recorded information has been reviewed and is accurate.   Noland Fordyce, PA-C 12/11/14 0110  Alfonzo Beers, MD 12/13/14 [phone]

## 2014-12-10 NOTE — ED Notes (Signed)
Pt reports 1 week left knee pain.   No known injury.  Ambulating WNL.

## 2014-12-10 NOTE — Discharge Instructions (Signed)

## 2014-12-22 ENCOUNTER — Emergency Department (INDEPENDENT_AMBULATORY_CARE_PROVIDER_SITE_OTHER)
Admission: EM | Admit: 2014-12-22 | Discharge: 2014-12-22 | Disposition: A | Discharge status: Home / Self Care | Payer: Medicare Other | Source: Home / Self Care | Attending: Family Medicine | Admitting: Family Medicine

## 2014-12-22 ENCOUNTER — Encounter (HOSPITAL_COMMUNITY): Payer: Self-pay | Admitting: Emergency Medicine

## 2014-12-22 DIAGNOSIS — M62838 Other muscle spasm: Secondary | ICD-10-CM | POA: Diagnosis not present

## 2014-12-22 LAB — POCT I-STAT, CHEM 8
BUN: 12 mg/dL (ref 6–23)
CREATININE: 1 mg/dL (ref 0.50–1.35)
Calcium, Ion: 1.21 mmol/L (ref 1.12–1.23)
Chloride: 106 mmol/L (ref 96–112)
Glucose, Bld: 153 mg/dL — ABNORMAL HIGH (ref 70–99)
HEMATOCRIT: 51 % (ref 39.0–52.0)
Hemoglobin: 17.3 g/dL — ABNORMAL HIGH (ref 13.0–17.0)
POTASSIUM: 4.1 mmol/L (ref 3.5–5.1)
Sodium: 139 mmol/L (ref 135–145)
TCO2: 20 mmol/L (ref 0–100)

## 2014-12-22 LAB — CK: Total CK: 568 U/L — ABNORMAL HIGH (ref 7–232)

## 2014-12-22 MED ORDER — BACLOFEN 10 MG PO TABS: 10.0000 mg | ORAL_TABLET | Freq: Three times a day (TID) | ORAL | Status: DC | PRN

## 2014-12-22 NOTE — Discharge Instructions (Signed)
Thank you for coming in today. Take baclofen twice daily as needed.  Follow up with your doctor.  Muscle Cramps and Spasms Muscle cramps and spasms occur when a muscle or muscles tighten and you have no control over this tightening (involuntary muscle contraction). They are a common problem and can develop in any muscle. The most common place is in the calf muscles of the leg. Both muscle cramps and muscle spasms are involuntary muscle contractions, but they also have differences:   Muscle cramps are sporadic and painful. They may last a few seconds to a quarter of an hour. Muscle cramps are often more forceful and last longer than muscle spasms.  Muscle spasms may or may not be painful. They may also last just a few seconds or much longer. CAUSES  It is uncommon for cramps or spasms to be due to a serious underlying problem. In many cases, the cause of cramps or spasms is unknown. Some common causes are:   Overexertion.   Overuse from repetitive motions (doing the same thing over and over).   Remaining in a certain position for a long period of time.   Improper preparation, form, or technique while performing a sport or activity.   Dehydration.   Injury.   Side effects of some medicines.   Abnormally low levels of the salts and ions in your blood (electrolytes), especially potassium and calcium. This could happen if you are taking water pills (diuretics) or you are pregnant.  Some underlying medical problems can make it more likely to develop cramps or spasms. These include, but are not limited to:   Diabetes.   Parkinson disease.   Hormone disorders, such as thyroid problems.   Alcohol abuse.   Diseases specific to muscles, joints, and bones.   Blood vessel disease where not enough blood is getting to the muscles.  HOME CARE INSTRUCTIONS   Stay well hydrated. Drink enough water and fluids to keep your urine clear or pale yellow.  It may be helpful to  massage, stretch, and relax the affected muscle.  For tight or tense muscles, use a warm towel, heating pad, or hot shower water directed to the affected area.  If you are sore or have pain after a cramp or spasm, applying ice to the affected area may relieve discomfort.  Put ice in a plastic bag.  Place a towel between your skin and the bag.  Leave the ice on for 15-20 minutes, 03-04 times a day.  Medicines used to treat a known cause of cramps or spasms may help reduce their frequency or severity. Only take over-the-counter or prescription medicines as directed by your caregiver. SEEK MEDICAL CARE IF:  Your cramps or spasms get more severe, more frequent, or do not improve over time.  MAKE SURE YOU:   Understand these instructions.  Will watch your condition.  Will get help right away if you are not doing well or get worse. Document Released: 02/14/2002 Document Revised: 12/20/2012 Document Reviewed: 08/11/2012 [facility] Patient Information 2015 [place], [place]. This information is not intended to replace advice given to you by your health care provider. Make sure you discuss any questions you have with your health care provider.

## 2014-12-22 NOTE — ED Notes (Signed)
Pt has been experiencing cramping all over his body for three days.  Pt came to nurses station with his thumb in a cramp to show [place] what is going on.  Pt in no acute distress.

## 2014-12-22 NOTE — ED Provider Notes (Signed)
Mali A Kolasa is a 44 y.o. male who presents to Urgent Care today for body cramps. Over the past 4-5 days patient has had cramping occurring across his arms trapezius back sides quads and calves. This is occurring bilaterally. It is somewhat painful. He otherwise feels pretty well. He notes that he is having trouble sleeping recently has been taking more doxepin than usual. No fevers or chills vomiting or diarrhea. No significant muscle pain. Patient states that he drinks to 2 L of [facility] a day.   Past Medical History  Diagnosis Date  . Migraines   . Schizophrenia, schizo-affective   . Chronic back pain   . Knee pain, chronic    Past Surgical History  Procedure Laterality Date  . Knee surgery      four times  . Extraction of wisdom teeth     History  Substance Use Topics  . Smoking status: Current Every Day Smoker -- 3.00 packs/day for 10 years    Types: Cigarettes    Last Attempt to Quit: 02/26/2014  . Smokeless tobacco: Not on file  . Alcohol Use: No     Comment: occ   ROS as above Medications: No current facility-administered medications for this encounter.   Current Outpatient Prescriptions  Medication Sig Dispense Refill  . ALPRAZolam (XANAX) 1 MG tablet Take 1 mg by mouth 4 (four) times daily.    Marland Kitchen doxepin (SINEQUAN) 150 MG capsule Take 150 mg by mouth at bedtime.    Marland Kitchen HYDROcodone-acetaminophen (NORCO) 10-325 MG per tablet Take 1 tablet by mouth 3 (three) times daily.    . hydrOXYzine (ATARAX/VISTARIL) 25 MG tablet Take 1 tablet (25 mg total) by mouth 3 (three) times daily. 30 tablet 0  . [DISCONTINUED] omeprazole (PRILOSEC) 40 MG capsule Take 1 capsule (40 mg total) by mouth 2 (two) times daily. (Patient taking differently: Take 40 mg by mouth daily. )    . baclofen (LIORESAL) 10 MG tablet Take 1 tablet (10 mg total) by mouth 3 (three) times daily as needed for muscle spasms. 60 each 0  . [DISCONTINUED] amantadine (SYMMETREL) 100 MG capsule Take 1 capsule (100 mg  total) by mouth 2 (two) times daily. (Patient not taking: Reported on 07/28/2014) 60 capsule 0  . [DISCONTINUED] ziprasidone (GEODON) 40 MG capsule Take 1 capsule (40 mg total) by mouth 2 (two) times daily with a meal. (Patient not taking: Reported on 07/28/2014) 60 capsule 0   Allergies  Allergen Reactions  . Nsaids Other (See Comments)    Rectal bleeding  . Quetiapine Other (See Comments)    Psychosis with high dose (600mg )     Exam:  BP 117/74 mmHg  Pulse 93  Temp(Src) 98.6 F (37 C) (Oral)  Resp 14  SpO2 98% Gen: Well NAD HEENT: EOMI,  MMM Lungs: Normal work of breathing. CTABL Heart: RRR no MRG Abd: NABS, Soft. Nondistended, Nontender Exts: Brisk capillary refill, warm and well perfused.  MSK. Muscles nontender. No nodules palpated. No fasciculations visible  Results for orders placed or performed during the hospital encounter of 12/22/14 (from the past 24 hour(s))  I-STAT, chem 8     Status: Abnormal   Collection Time: 12/22/14  7:21 PM  Result Value Ref Range   Sodium 139 135 - 145 mmol/L   Potassium 4.1 3.5 - 5.1 mmol/L   Chloride 106 96 - 112 mmol/L   BUN 12 6 - 23 mg/dL   Creatinine, Ser 1.00 0.50 - 1.35 mg/dL   Glucose, Bld 153 (  H) 70 - 99 mg/dL   Calcium, Ion 1.21 1.12 - 1.23 mmol/L   TCO2 20 0 - 100 mmol/L   Hemoglobin 17.3 (H) 13.0 - 17.0 g/dL   HCT 51.0 39.0 - 52.0 %   No results found.  Assessment and Plan: 44 y.o. male with muscle cramps. Unclear etiology. Caffeine May be related to his cramping. CK pending. Follow-up with PCP. Treatment with baclofen. Mild elevated blood glucose. This is likely related to [facility]. Follow-up with PCP.  Discussed warning signs or symptoms. Please see discharge instructions. Patient expresses understanding.     Gregor Hams, MD 12/22/14 [phone]

## 2014-12-30 ENCOUNTER — Telehealth (HOSPITAL_COMMUNITY): Payer: Self-pay | Admitting: Family Medicine

## 2014-12-30 NOTE — ED Notes (Signed)
Unable to contact patient regarding labs.   Gregor Hams, MD 12/30/14 [phone]

## 2015-01-08 ENCOUNTER — Other Ambulatory Visit (HOSPITAL_COMMUNITY): Payer: Self-pay | Admitting: Orthopaedic Surgery

## 2015-01-08 DIAGNOSIS — M25562 Pain in left knee: Secondary | ICD-10-CM

## 2015-01-18 ENCOUNTER — Encounter (HOSPITAL_COMMUNITY)
Admission: RE | Admit: 2015-01-18 | Discharge: 2015-01-18 | Disposition: A | Discharge status: Home / Self Care | Payer: Medicare Other | Source: Ambulatory Visit | Attending: Orthopaedic Surgery | Admitting: Orthopaedic Surgery

## 2015-01-18 ENCOUNTER — Encounter (HOSPITAL_COMMUNITY): Payer: Self-pay

## 2015-01-18 DIAGNOSIS — M25562 Pain in left knee: Secondary | ICD-10-CM | POA: Insufficient documentation

## 2015-01-18 HISTORY — DX: Unspecified asthma, uncomplicated: J45.909

## 2015-01-18 MED ORDER — TECHNETIUM TC 99M MEDRONATE IV KIT
25.0000 | PACK | Freq: Once | INTRAVENOUS | Status: AC | PRN
  Administered 2015-01-18: 26 via INTRAVENOUS

## 2015-01-20 DIAGNOSIS — M25571 Pain in right ankle and joints of right foot: Secondary | ICD-10-CM | POA: Diagnosis not present

## 2015-01-20 DIAGNOSIS — G8929 Other chronic pain: Secondary | ICD-10-CM | POA: Diagnosis not present

## 2015-01-20 DIAGNOSIS — M25572 Pain in left ankle and joints of left foot: Secondary | ICD-10-CM | POA: Diagnosis not present

## 2015-01-20 DIAGNOSIS — M25562 Pain in left knee: Secondary | ICD-10-CM | POA: Insufficient documentation

## 2015-01-20 DIAGNOSIS — Z720 Tobacco use: Secondary | ICD-10-CM | POA: Insufficient documentation

## 2015-01-20 DIAGNOSIS — F1510 Other stimulant abuse, uncomplicated: Secondary | ICD-10-CM | POA: Diagnosis not present

## 2015-01-20 DIAGNOSIS — Z8679 Personal history of other diseases of the circulatory system: Secondary | ICD-10-CM | POA: Insufficient documentation

## 2015-01-20 DIAGNOSIS — K219 Gastro-esophageal reflux disease without esophagitis: Secondary | ICD-10-CM | POA: Diagnosis not present

## 2015-01-20 DIAGNOSIS — Z79899 Other long term (current) drug therapy: Secondary | ICD-10-CM | POA: Diagnosis not present

## 2015-01-20 DIAGNOSIS — F419 Anxiety disorder, unspecified: Secondary | ICD-10-CM | POA: Diagnosis not present

## 2015-01-20 DIAGNOSIS — J449 Chronic obstructive pulmonary disease, unspecified: Secondary | ICD-10-CM | POA: Insufficient documentation

## 2015-01-21 ENCOUNTER — Emergency Department (HOSPITAL_COMMUNITY)
Admission: EM | Admit: 2015-01-21 | Discharge: 2015-01-21 | Disposition: A | Discharge status: Home / Self Care | Payer: Medicare Other | Attending: Emergency Medicine | Admitting: Emergency Medicine

## 2015-01-21 ENCOUNTER — Emergency Department (HOSPITAL_COMMUNITY): Payer: Medicare Other

## 2015-01-21 ENCOUNTER — Encounter (HOSPITAL_COMMUNITY): Payer: Self-pay | Admitting: Emergency Medicine

## 2015-01-21 DIAGNOSIS — Z720 Tobacco use: Secondary | ICD-10-CM

## 2015-01-21 DIAGNOSIS — K219 Gastro-esophageal reflux disease without esophagitis: Secondary | ICD-10-CM

## 2015-01-21 DIAGNOSIS — F419 Anxiety disorder, unspecified: Secondary | ICD-10-CM

## 2015-01-21 DIAGNOSIS — F1510 Other stimulant abuse, uncomplicated: Secondary | ICD-10-CM

## 2015-01-21 HISTORY — DX: Chronic obstructive pulmonary disease, unspecified: J44.9

## 2015-01-21 MED ORDER — GI COCKTAIL ~~LOC~~
30.0000 mL | Freq: Once | ORAL | Status: AC
  Administered 2015-01-21: 30 mL via ORAL
  Filled 2015-01-21: qty 30

## 2015-01-21 NOTE — Discharge Instructions (Signed)
It is important for you to stop smoking.  It is also important for you to cut back on your caffeine intake as it may be contributing to your overall anxiety.  Continue taking your acid reflux medication as prescribed.  If you're having acute symptoms of heartburn or indigestion.  You can try Tums or Mylanta.  Your chest x-ray today did not show any signs of infection or inflammation.  Follow-up with your orthopedist for your ongoing joint pain.  Return to the emergency department for worsening condition or new concerning symptoms.   Food Choices for Gastroesophageal Reflux Disease When you have gastroesophageal reflux disease (GERD), the foods you eat and your eating habits are very important. Choosing the right foods can help ease the discomfort of GERD. WHAT GENERAL GUIDELINES DO I NEED TO FOLLOW?  Choose fruits, vegetables, whole grains, low-fat dairy products, and low-fat meat, fish, and poultry.  Limit fats such as oils, salad dressings, butter, nuts, and avocado.  Keep a food diary to identify foods that cause symptoms.  Avoid foods that cause reflux. These may be different for different people.  Eat frequent small meals instead of three large meals each day.  Eat your meals slowly, in a relaxed setting.  Limit fried foods.  Cook foods using methods other than frying.  Avoid drinking alcohol.  Avoid drinking large amounts of liquids with your meals.  Avoid bending over or lying down until 2-3 hours after eating. WHAT FOODS ARE NOT RECOMMENDED? The following are some foods and drinks that may worsen your symptoms: Vegetables Tomatoes. Tomato juice. Tomato and spaghetti sauce. Chili peppers. Onion and garlic. Horseradish. Fruits Oranges, grapefruit, and lemon (fruit and juice). Meats High-fat meats, fish, and poultry. This includes hot dogs, ribs, ham, sausage, salami, and bacon. Dairy Whole milk and chocolate milk. Sour cream. Cream. Butter. Ice cream. Cream cheese.    Beverages Coffee and tea, with or without caffeine. Carbonated beverages or energy drinks. Condiments Hot sauce. Barbecue sauce.  Sweets/Desserts Chocolate and cocoa. Donuts. Peppermint and spearmint. Fats and Oils High-fat foods, including [place] fries and potato chips. Other Vinegar. Strong spices, such as black pepper, white pepper, red pepper, cayenne, curry powder, cloves, ginger, and chili powder. The items listed above may not be a complete list of foods and beverages to avoid. Contact your dietitian for more information. Document Released: 08/25/2005 Document Revised: 08/30/2013 Document Reviewed: 06/29/2013 [facility] Patient Information 2015 [place], [place]. This information is not intended to replace advice given to you by your health care provider. Make sure you discuss any questions you have with your health care provider.  Gastroesophageal Reflux Disease, Adult Gastroesophageal reflux disease (GERD) happens when acid from your stomach goes into your food pipe (esophagus). The acid can cause a burning feeling in your chest. Over time, the acid can make small holes (ulcers) in your food pipe.  HOME CARE  Ask your doctor for advice about:  Losing weight.  Quitting smoking.  Alcohol use.  Avoid foods and drinks that make your problems worse. You may want to avoid:  Caffeine and alcohol.  Chocolate.  Mints.  Garlic and onions.  Spicy foods.  Citrus fruits, such as oranges, lemons, or limes.  Foods that contain tomato, such as sauce, chili, salsa, and pizza.  Fried and fatty foods.  Avoid lying down for 3 hours before you go to bed or before you take a nap.  Eat small meals often, instead of large meals.  Wear loose-fitting clothing. Do not wear anything tight  around your waist.  Raise (elevate) the head of your bed 6 to 8 inches with wood blocks. Using extra pillows does not help.  Only take medicines as told by your doctor.  Do not take aspirin or  ibuprofen. GET HELP RIGHT AWAY IF:   You have pain in your arms, neck, jaw, teeth, or back.  Your pain gets worse or changes.  You feel sick to your stomach (nauseous), throw up (vomit), or sweat (diaphoresis).  You feel short of breath, or you pass out (faint).  Your throw up is green, yellow, black, or looks like coffee grounds or blood.  Your poop (stool) is red, bloody, or black. MAKE SURE YOU:   Understand these instructions.  Will watch your condition.  Will get help right away if you are not doing well or get worse. Document Released: 02/11/2008 Document Revised: 11/17/2011 Document Reviewed: 03/14/2011 [facility] Patient Information 2015 [place], [place]. This information is not intended to replace advice given to you by your health care provider. Make sure you discuss any questions you have with your health care provider.  Panic Attacks Panic attacks are sudden, short-livedsurges of severe anxiety, fear, or discomfort. They may occur for no reason when you are relaxed, when you are anxious, or when you are sleeping. Panic attacks may occur for a number of reasons:   Healthy people occasionally have panic attacks in extreme, life-threatening situations, such as war or natural disasters. Normal anxiety is a protective mechanism of the body that helps [place] react to danger (fight or flight response).  Panic attacks are often seen with anxiety disorders, such as panic disorder, social anxiety disorder, generalized anxiety disorder, and phobias. Anxiety disorders cause excessive or uncontrollable anxiety. They may interfere with your relationships or other life activities.  Panic attacks are sometimes seen with other mental illnesses, such as depression and posttraumatic stress disorder.  Certain medical conditions, prescription medicines, and drugs of abuse can cause panic attacks. SYMPTOMS  Panic attacks start suddenly, peak within 20 minutes, and are accompanied by four or more  of the following symptoms:  Pounding heart or fast heart rate (palpitations).  Sweating.  Trembling or shaking.  Shortness of breath or feeling smothered.  Feeling choked.  Chest pain or discomfort.  Nausea or strange feeling in your stomach.  Dizziness, light-headedness, or feeling like you will faint.  Chills or hot flushes.  Numbness or tingling in your lips or hands and feet.  Feeling that things are not real or feeling that you are not yourself.  Fear of losing control or going crazy.  Fear of dying. Some of these symptoms can mimic serious medical conditions. For example, you may think you are having a heart attack. Although panic attacks can be very scary, they are not life threatening. DIAGNOSIS  Panic attacks are diagnosed through an assessment by your health care provider. Your health care provider will ask questions about your symptoms, such as where and when they occurred. Your health care provider will also ask about your medical history and use of alcohol and drugs, including prescription medicines. Your health care provider may order blood tests or other studies to rule out a serious medical condition. Your health care provider may refer you to a mental health professional for further evaluation. TREATMENT   Most healthy people who have one or two panic attacks in an extreme, life-threatening situation will not require treatment.  The treatment for panic attacks associated with anxiety disorders or other mental illness typically involves counseling  with a mental health professional, medicine, or a combination of both. Your health care provider will help determine what treatment is best for you.  Panic attacks due to physical illness usually go away with treatment of the illness. If prescription medicine is causing panic attacks, talk with your health care provider about stopping the medicine, decreasing the dose, or substituting another medicine.  Panic attacks  due to alcohol or drug abuse go away with abstinence. Some adults need professional help in order to stop drinking or using drugs. HOME CARE INSTRUCTIONS   Take all medicines as directed by your health care provider.   Schedule and attend follow-up visits as directed by your health care provider. It is important to keep all your appointments. SEEK MEDICAL CARE IF:  You are not able to take your medicines as prescribed.  Your symptoms do not improve or get worse. SEEK IMMEDIATE MEDICAL CARE IF:   You experience panic attack symptoms that are different than your usual symptoms.  You have serious thoughts about hurting yourself or others.  You are taking medicine for panic attacks and have a serious side effect. MAKE SURE YOU:  Understand these instructions.  Will watch your condition.  Will get help right away if you are not doing well or get worse. Document Released: 08/25/2005 Document Revised: 08/30/2013 Document Reviewed: 04/08/2013 [facility] Patient Information 2015 [place], [place]. This information is not intended to replace advice given to you by your health care provider. Make sure you discuss any questions you have with your health care provider.  Smoking Cessation Quitting smoking is important to your health and has many advantages. However, it is not always easy to quit since nicotine is a very addictive drug. Oftentimes, people try 3 times or more before being able to quit. This document explains the best ways for you to prepare to quit smoking. Quitting takes hard work and a lot of effort, but you can do it. ADVANTAGES OF QUITTING SMOKING  You will live longer, feel better, and live better.  Your body will feel the impact of quitting smoking almost immediately.  Within 20 minutes, blood pressure decreases. Your pulse returns to its normal level.  After 8 hours, carbon monoxide levels in the blood return to normal. Your oxygen level increases.  After 24 hours, the  chance of having a heart attack starts to decrease. Your breath, hair, and body stop smelling like smoke.  After 48 hours, damaged nerve endings begin to recover. Your sense of taste and smell improve.  After 72 hours, the body is virtually free of nicotine. Your bronchial tubes relax and breathing becomes easier.  After 2 to 12 weeks, lungs can hold more air. Exercise becomes easier and circulation improves.  The risk of having a heart attack, stroke, cancer, or lung disease is greatly reduced.  After 1 year, the risk of coronary heart disease is cut in half.  After 5 years, the risk of stroke falls to the same as a nonsmoker.  After 10 years, the risk of lung cancer is cut in half and the risk of other cancers decreases significantly.  After 15 years, the risk of coronary heart disease drops, usually to the level of a nonsmoker.  If you are pregnant, quitting smoking will improve your chances of having a healthy baby.  The people you live with, especially any children, will be healthier.  You will have extra money to spend on things other than cigarettes. QUESTIONS TO THINK ABOUT BEFORE ATTEMPTING TO QUIT  You may want to talk about your answers with your health care provider.  Why do you want to quit?  If you tried to quit in the past, what helped and what did not?  What will be the most difficult situations for you after you quit? How will you plan to handle them?  Who can help you through the tough times? Your family? Friends? A health care provider?  What pleasures do you get from smoking? What ways can you still get pleasure if you quit? Here are some questions to ask your health care provider:  How can you help me to be successful at quitting?  What medicine do you think would be best for me and how should I take it?  What should I do if I need more help?  What is smoking withdrawal like? How can I get information on withdrawal? GET READY  Set a quit  date.  Change your environment by getting rid of all cigarettes, ashtrays, matches, and lighters in your home, car, or work. Do not let people smoke in your home.  Review your past attempts to quit. Think about what worked and what did not. GET SUPPORT AND ENCOURAGEMENT You have a better chance of being successful if you have help. You can get support in many ways.  Tell your family, friends, and coworkers that you are going to quit and need their support. Ask them not to smoke around you.  Get individual, group, or telephone counseling and support. Programs are available at [employer] and health centers. Call your local health department for information about programs in your area.  Spiritual beliefs and practices may help some smokers quit.  Download a "quit meter" on your computer to keep track of quit statistics, such as how long you have gone without smoking, cigarettes not smoked, and money saved.  Get a self-help book about quitting smoking and staying off tobacco. [place] yourself from urges to smoke. Talk to someone, go for a walk, or occupy your time with a task.  Change your normal routine. Take a different route to work. Drink tea instead of coffee. Eat breakfast in a different place.  Reduce your stress. Take a hot bath, exercise, or read a book.  Plan something enjoyable to do every day. Reward yourself for not smoking.  Explore interactive web-based programs that specialize in helping you quit. GET MEDICINE AND USE IT CORRECTLY Medicines can help you stop smoking and decrease the urge to smoke. Combining medicine with the above behavioral methods and support can greatly increase your chances of successfully quitting smoking.  Nicotine replacement therapy helps deliver nicotine to your body without the negative effects and risks of smoking. Nicotine replacement therapy includes nicotine gum, lozenges, inhalers, nasal sprays, and skin  patches. Some may be available over-the-counter and others require a prescription.  Antidepressant medicine helps people abstain from smoking, but how this works is unknown. This medicine is available by prescription.  Nicotinic receptor partial agonist medicine simulates the effect of nicotine in your brain. This medicine is available by prescription. Ask your health care provider for advice about which medicines to use and how to use them based on your health history. Your health care provider will tell you what side effects to look out for if you choose to be on a medicine or therapy. Carefully read the information on the package. Do not use any other product containing nicotine while using a nicotine replacement product.  RELAPSE OR DIFFICULT SITUATIONS Most relapses occur within the first 3 months after quitting. Do not be discouraged if you start smoking again. Remember, most people try several times before finally quitting. You may have symptoms of withdrawal because your body is used to nicotine. You may crave cigarettes, be irritable, feel very hungry, cough often, get headaches, or have difficulty concentrating. The withdrawal symptoms are only temporary. They are strongest when you first quit, but they will go away within 10-14 days. To reduce the chances of relapse, try to:  Avoid drinking alcohol. Drinking lowers your chances of successfully quitting.  Reduce the amount of caffeine you consume. Once you quit smoking, the amount of caffeine in your body increases and can give you symptoms, such as a rapid heartbeat, sweating, and anxiety.  Avoid smokers because they can make you want to smoke.  Do not let weight gain distract you. Many smokers will gain weight when they quit, usually less than 10 pounds. Eat a healthy diet and stay active. You can always lose the weight gained after you quit.  Find ways to improve your mood other than smoking. FOR MORE INFORMATION  www.smokefree.gov   Document Released: 08/19/2001 Document Revised: 01/09/2014 Document Reviewed: 12/04/2011 [facility] Patient Information 2015 [place], [place]. This information is not intended to replace advice given to you by your health care provider. Make sure you discuss any questions you have with your health care provider.  You Can Quit Smoking If you are ready to quit smoking or are thinking about it, congratulations! You have chosen to help yourself be healthier and live longer! There are lots of different ways to quit smoking. Nicotine gum, nicotine patches, a nicotine inhaler, or nicotine nasal spray can help with physical craving. Hypnosis, support groups, and medicines help break the habit of smoking. TIPS TO GET OFF AND STAY OFF CIGARETTES  Learn to predict your moods. Do not let a bad situation be your excuse to have a cigarette. Some situations in your life might tempt you to have a cigarette.  Ask friends and co-workers not to smoke around you.  Make your home smoke-free.  Never have "just one" cigarette. It leads to wanting another and another. Remind yourself of your decision to quit.  On a card, make a list of your reasons for not smoking. Read it at least the same number of times a day as you have a cigarette. Tell yourself everyday, "I do not want to smoke. I choose not to smoke."  Ask someone at home or work to help you with your plan to quit smoking.  Have something planned after you eat or have a cup of coffee. Take a walk or get other exercise to perk you up. This will help to keep you from overeating.  Try a relaxation exercise to calm you down and decrease your stress. Remember, you may be tense and nervous the first two weeks after you quit. This will pass.  Find new activities to keep your hands busy. Play with a pen, coin, or rubber band. Doodle or draw things on paper.  Brush your teeth right after eating. This will help cut down the craving for the taste of tobacco after meals.  You can try mouthwash too.  Try gum, breath mints, or diet candy to keep something in your mouth. IF YOU SMOKE AND WANT TO QUIT:  Do not stock up on cigarettes. Never buy a carton. Wait until one pack is finished before you buy another.  Never carry  cigarettes with you at work or at home.  Keep cigarettes as far away from you as possible. Leave them with someone else.  Never carry matches or a lighter with you.  Ask yourself, "Do I need this cigarette or is this just a reflex?"  Bet with someone that you can quit. Put cigarette money in a piggy bank every morning. If you smoke, you give up the money. If you do not smoke, by the end of the week, you keep the money.  Keep trying. It takes 21 days to change a habit!  Talk to your doctor about using medicines to help you quit. These include nicotine replacement gum, lozenges, or skin patches. Document Released: 06/21/2009 Document Revised: 11/17/2011 Document Reviewed: 06/21/2009 [facility] Patient Information 2015 [place], [place]. This information is not intended to replace advice given to you by your health care provider. Make sure you discuss any questions you have with your health care provider.

## 2015-01-21 NOTE — ED Notes (Signed)
Pt states while at his mothers home @ 1.5 hours ago he had a severe anxiety attack, had some shob, went outside smoked some cigarettes, vomited began to feel better but now feels like he aspirated. Pt also c/o pain to L knee, R ankle and R great toe.

## 2015-01-21 NOTE — ED Provider Notes (Signed)
CSN: [id number]     Arrival date & time 01/20/15  2359 History   First MD Initiated Contact with Patient 01/21/15 0017     Chief Complaint  Patient presents with  . Anxiety  . Cough     (Consider location/radiation/quality/duration/timing/severity/associated sxs/prior Treatment) HPI 44 year old male presents to emergency department with multiple complaints.  He reports that he had cold feet after taking a shower despite putting socks on.  After eating dinner he felt overstuffed and had reflux symptoms.  He has history of reflux.  He reports that he took 2 doses of his omeprazole without improvement in symptoms.  He began to feel short of breath.  Patient went out on the porch and smoked a pack of cigarettes which did not improve his reflux symptoms.  He threw up, and then drank a gallon of [employer] [facility] [employer] and reports that his breathing was much better after drinking [facility].  Patient also complains of chronic pain in his ankles, right great toe and left knee.  Patient had nuclear medicine study this week done through his orthopedist to evaluate his knee pain.  No fevers or chills.  Patient reports that he has been having anxiety throughout the day today and has taken 4 mg of Xanax without improvement.  He reports that he feels like he is still having reflux and anxiety. Past Medical History  Diagnosis Date  . Migraines   . Schizophrenia, schizo-affective   . Chronic back pain   . Knee pain, chronic   . Asthma   . COPD (chronic obstructive pulmonary disease)    Past Surgical History  Procedure Laterality Date  . Knee surgery      four times  . Extraction of wisdom teeth     No family history on file. History  Substance Use Topics  . Smoking status: Current Every Day Smoker -- 3.00 packs/day for 10 years    Types: Cigarettes    Last Attempt to Quit: 02/26/2014  . Smokeless tobacco: Not on file  . Alcohol Use: No     Comment: occ    Review of Systems   See History of Present  Illness; otherwise all other systems are reviewed and negative  Allergies  Nsaids and Quetiapine  Home Medications   Prior to Admission medications   Medication Sig Start Date End Date Taking? Authorizing Provider  ALPRAZolam [name]) 1 MG tablet Take 1 mg by mouth 4 (four) times daily.   Yes Historical Provider, MD  doxepin (SINEQUAN) 100 MG capsule Take 200 mg by mouth at bedtime. 12/28/14  Yes Historical Provider, MD  fluPHENAZine (PROLIXIN) 10 MG tablet Take 30 mg by mouth at bedtime. 12/28/14  Yes Historical Provider, MD  HYDROcodone-acetaminophen (NORCO) 10-325 MG per tablet Take 1 tablet by mouth 3 (three) times daily.   Yes Historical Provider, MD  mupirocin ointment (BACTROBAN) 2 % Apply 1 application topically 3 (three) times daily as needed (to sores on scalp).  11/20/14  Yes Historical Provider, MD  baclofen (LIORESAL) 10 MG tablet Take 1 tablet (10 mg total) by mouth 3 (three) times daily as needed for muscle spasms. Patient not taking: Reported on 01/21/2015 12/22/14   Gregor Hams, MD  hydrOXYzine (ATARAX/VISTARIL) 25 MG tablet Take 1 tablet (25 mg total) by mouth 3 (three) times daily. Patient not taking: Reported on 01/21/2015 03/06/14   Niel Hummer, NP   BP 150/90 mmHg  Pulse 78  Temp(Src) 98.4 F (36.9 C) (Oral)  Resp 20  SpO2 98%  Physical Exam  Constitutional: He is oriented to person, place, and time. He appears well-developed and well-nourished.  HENT:  Head: Normocephalic and atraumatic.  Nose: Nose normal.  Mouth/Throat: Oropharynx is clear and moist.  Eyes: Conjunctivae and EOM are normal. Pupils are equal, round, and reactive to light.  Neck: Normal range of motion. Neck supple. No JVD present. No tracheal deviation present. No thyromegaly present.  Cardiovascular: Normal rate, regular rhythm, normal heart sounds and intact distal pulses.  Exam reveals no gallop and no friction rub.   No murmur heard. Pulmonary/Chest: Effort normal. No stridor. No respiratory  distress. He has no wheezes. He has no rales. He exhibits no tenderness.  Diminished in bases.  No wheezes noted.  No cough  Abdominal: Soft. Bowel sounds are normal. He exhibits no distension and no mass. There is no tenderness. There is no rebound and no guarding.  Musculoskeletal: Normal range of motion. He exhibits tenderness (patient has tenderness with range of motion of ankles, left knee.). He exhibits no edema.  Lymphadenopathy:    He has no cervical adenopathy.  Neurological: He is alert and oriented to person, place, and time. He displays normal reflexes. He exhibits normal muscle tone. Coordination normal.  Skin: Skin is warm and dry. No rash noted. No erythema. No pallor.  Psychiatric: He has a normal mood and affect. His behavior is normal. Judgment and thought content normal.  Nursing note and vitals reviewed.   ED Course  Procedures (including critical care time) Labs Review Labs Reviewed - No data to display  Imaging Review Dg Chest 2 View  01/21/2015   CLINICAL DATA:  Severe anxiety attack 1.5 hours ago. Shortness of breath. Vomiting and feels like may have aspirated.  EXAM: CHEST  2 VIEW  COMPARISON:  01/04/2015  FINDINGS: Normal heart size and pulmonary vascularity. No focal airspace disease or consolidation in the lungs. No blunting of costophrenic angles. No pneumothorax. Mediastinal contours appear intact. Old fracture deformity of the left clavicle.  IMPRESSION: No active cardiopulmonary disease.   Electronically Signed   By: Lucienne Capers M.D.   On: 01/21/2015 00:58     EKG Interpretation None      MDM   Final diagnoses:  Anxiety  Gastroesophageal reflux disease, esophagitis presence not specified  Tobacco abuse  Caffeine abuse    44 year old male with self-reported panic attack this evening, reflux, joint pain.  I've advised the patient that we will defer evaluating his joint pain at this time as he has a orthopedist that he is actively seeing.  I have  explained to him that with acute symptoms of reflux, taking additional of upper salt, will not help, and he should take Mylanta or Tums to help with acute symptoms.  He has been advised to stop smoking and avoid caffeine use, especially when he is having shortness of breath and cough and anxiety symptoms as caffeine, will only worsen his complaints.  Plan for chest x-ray, as he feels like he aspirated.  Patient has a primary care doctor.  He can follow-up with.  He is otherwise stable    Linton Flemings, MD 01/21/15 [phone]

## 2015-01-26 ENCOUNTER — Encounter (HOSPITAL_COMMUNITY): Payer: Self-pay | Admitting: Emergency Medicine

## 2015-01-26 ENCOUNTER — Emergency Department (HOSPITAL_COMMUNITY)
Admission: EM | Admit: 2015-01-26 | Discharge: 2015-01-27 | Disposition: A | Discharge status: Home / Self Care | Payer: Medicare Other | Attending: Emergency Medicine | Admitting: Emergency Medicine

## 2015-01-26 DIAGNOSIS — Z79899 Other long term (current) drug therapy: Secondary | ICD-10-CM | POA: Diagnosis not present

## 2015-01-26 DIAGNOSIS — Z8679 Personal history of other diseases of the circulatory system: Secondary | ICD-10-CM | POA: Diagnosis not present

## 2015-01-26 DIAGNOSIS — G8929 Other chronic pain: Secondary | ICD-10-CM | POA: Diagnosis not present

## 2015-01-26 DIAGNOSIS — R0602 Shortness of breath: Secondary | ICD-10-CM | POA: Diagnosis present

## 2015-01-26 DIAGNOSIS — Z720 Tobacco use: Secondary | ICD-10-CM | POA: Diagnosis not present

## 2015-01-26 DIAGNOSIS — F419 Anxiety disorder, unspecified: Secondary | ICD-10-CM | POA: Diagnosis not present

## 2015-01-26 DIAGNOSIS — R079 Chest pain, unspecified: Secondary | ICD-10-CM | POA: Insufficient documentation

## 2015-01-26 DIAGNOSIS — F250 Schizoaffective disorder, bipolar type: Secondary | ICD-10-CM | POA: Diagnosis not present

## 2015-01-26 DIAGNOSIS — J441 Chronic obstructive pulmonary disease with (acute) exacerbation: Secondary | ICD-10-CM | POA: Insufficient documentation

## 2015-01-26 LAB — CBC
HCT: 46.8 % (ref 39.0–52.0)
Hemoglobin: 16.2 g/dL (ref 13.0–17.0)
MCH: 30.7 pg (ref 26.0–34.0)
MCHC: 34.6 g/dL (ref 30.0–36.0)
MCV: 88.8 fL (ref 78.0–100.0)
PLATELETS: 221 10*3/uL (ref 150–400)
RBC: 5.27 MIL/uL (ref 4.22–5.81)
RDW: 13.1 % (ref 11.5–15.5)
WBC: 10.4 10*3/uL (ref 4.0–10.5)

## 2015-01-26 LAB — BASIC METABOLIC PANEL
Anion gap: 10 (ref 5–15)
BUN: 6 mg/dL (ref 6–20)
CO2: 25 mmol/L (ref 22–32)
CREATININE: 0.94 mg/dL (ref 0.61–1.24)
Calcium: 9.3 mg/dL (ref 8.9–10.3)
Chloride: 105 mmol/L (ref 101–111)
GFR calc non Af Amer: >60 mL/min (ref >60)
Glucose, Bld: 156 mg/dL — ABNORMAL HIGH (ref 65–99)
Potassium: 3.7 mmol/L (ref 3.5–5.1)
Sodium: 140 mmol/L (ref 135–145)

## 2015-01-26 LAB — I-STAT TROPONIN, ED: Troponin i, poc: 0 ng/mL (ref 0.00–0.08)

## 2015-01-26 NOTE — ED Notes (Signed)
Pt outside smoking when called for room.

## 2015-01-26 NOTE — ED Notes (Signed)
Pt. On monitor. 

## 2015-01-26 NOTE — ED Notes (Signed)
Pt c/o tightness across chest and SOB since dinner. Pt also c/o dizziness. When Pt was called for triage he was at the drink machine getting a mountain dew and some snacks. Pt ambulatory without difficulty. Pt speaking in complete sentences and appears to be in NAD. Pt has hx of anxiety and when asked if he felt anxious pt sts "Yes, but worse than usual." Pt denies abdominal pain, has hx of reflux. Pt sts "even when I breathe in I'm not breathing."  Pt A&Ox4.

## 2015-01-27 DIAGNOSIS — F419 Anxiety disorder, unspecified: Secondary | ICD-10-CM | POA: Diagnosis not present

## 2015-01-27 LAB — HEPATIC FUNCTION PANEL
ALT: 61 U/L (ref 17–63)
AST: 42 U/L — AB (ref 15–41)
Albumin: 4.2 g/dL (ref 3.5–5.0)
Alkaline Phosphatase: 60 U/L (ref 38–126)
BILIRUBIN TOTAL: 0.4 mg/dL (ref 0.3–1.2)
Bilirubin, Direct: <0.1 mg/dL — ABNORMAL LOW (ref 0.1–0.5)
Total Protein: 7.3 g/dL (ref 6.5–8.1)

## 2015-01-27 MED ORDER — IPRATROPIUM-ALBUTEROL 0.5-2.5 (3) MG/3ML IN SOLN
3.0000 mL | RESPIRATORY_TRACT | Status: DC
Start: 2015-01-27 — End: 2015-01-27

## 2015-01-27 MED ORDER — IPRATROPIUM BROMIDE 0.02 % IN SOLN
1.0000 mg | Freq: Once | RESPIRATORY_TRACT | Status: AC
  Administered 2015-01-27: 1 mg via RESPIRATORY_TRACT
  Filled 2015-01-27: qty 5

## 2015-01-27 NOTE — Discharge Instructions (Signed)
Panic Attacks Mr. [name], your EKG and blood levels for heart or normal today. He need to stop smoking cigarettes as this can make you more short of breath. See your primary care physician within 3 days for close follow-up and adjustment of your medications. If any symptoms worsen come back to emergency department immediately. Thank you. Panic attacks are sudden, short feelings of great fear or discomfort. You may have them for no reason when you are relaxed, when you are uneasy (anxious), or when you are sleeping.  HOME CARE  Take all your medicines as told.  Check with your doctor before starting new medicines.  Keep all doctor visits. GET HELP IF:  You are not able to take your medicines as told.  Your symptoms do not get better.  Your symptoms get worse. GET HELP RIGHT AWAY IF:  Your attacks seem different than your normal attacks.  You have thoughts about hurting yourself or others.  You take panic attack medicine and you have a side effect. MAKE SURE YOU:  Understand these instructions.  Will watch your condition.  Will get help right away if you are not doing well or get worse. Document Released: 09/27/2010 Document Revised: 06/15/2013 Document Reviewed: 04/08/2013 [facility] Patient Information 2015 [place], [place]. This information is not intended to replace advice given to you by your health care provider. Make sure you discuss any questions you have with your health care provider. Generalized Anxiety Disorder Generalized anxiety disorder (GAD) is a mental disorder. It interferes with life functions, including relationships, work, and school. GAD is different from normal anxiety, which everyone experiences at some point in their lives in response to specific life events and activities. Normal anxiety actually helps [place] prepare for and get through these life events and activities. Normal anxiety goes away after the event or activity is over.  GAD causes anxiety that is not  necessarily related to specific events or activities. It also causes excess anxiety in proportion to specific events or activities. The anxiety associated with GAD is also difficult to control. GAD can vary from mild to severe. People with severe GAD can have intense waves of anxiety with physical symptoms (panic attacks).  SYMPTOMS The anxiety and worry associated with GAD are difficult to control. This anxiety and worry are related to many life events and activities and also occur more days than not for 6 months or longer. People with GAD also have three or more of the following symptoms (one or more in children):  Restlessness.   Fatigue.  Difficulty concentrating.   Irritability.  Muscle tension.  Difficulty sleeping or unsatisfying sleep. DIAGNOSIS GAD is diagnosed through an assessment by your health care provider. Your health care provider will ask you questions aboutyour mood,physical symptoms, and events in your life. Your health care provider may ask you about your medical history and use of alcohol or drugs, including prescription medicines. Your health care provider may also do a physical exam and blood tests. Certain medical conditions and the use of certain substances can cause symptoms similar to those associated with GAD. Your health care provider may refer you to a mental health specialist for further evaluation. TREATMENT The following therapies are usually used to treat GAD:   Medication. Antidepressant medication usually is prescribed for long-term daily control. Antianxiety medicines may be added in severe cases, especially when panic attacks occur.   Talk therapy (psychotherapy). Certain types of talk therapy can be helpful in treating GAD by providing support, education, and guidance. A  form of talk therapy called cognitive behavioral therapy can teach you healthy ways to think about and react to daily life events and activities.  Stress managementtechniques.  These include yoga, meditation, and exercise and can be very helpful when they are practiced regularly. A mental health specialist can help determine which treatment is best for you. Some people see improvement with one therapy. However, other people require a combination of therapies. Document Released: 12/20/2012 Document Revised: 01/09/2014 Document Reviewed: 12/20/2012 [facility] Patient Information 2015 [place], [place]. This information is not intended to replace advice given to you by your health care provider. Make sure you discuss any questions you have with your health care provider.

## 2015-01-27 NOTE — ED Notes (Signed)
MD at bedside. 

## 2015-01-27 NOTE — ED Provider Notes (Signed)
CSN: [id number]     Arrival date & time 01/26/15  2202 History   First MD Initiated Contact with Patient 01/27/15 0001     Chief Complaint  Patient presents with  . Chest Pain  . Shortness of Breath  . Anxiety     (Consider location/radiation/quality/duration/timing/severity/associated sxs/prior Treatment) HPI [name] is a 44 y.o. male with past medical history of COPD, asthma, panic attacks, anxiety presenting today with shortness of breath. Patient states this began 2 hours after dinner while he was laying in bed trying to sleep. He has had increase in attacks over the past month. His primary care physician is treating this with Xanax, he is currently on the maximum dose per day. Patient states the Xanax is no longer helping. He feels as if he has fluid in his lungs and he is drowning. He has been evaluated for this in the past with chest x-rays, 2 in the last week. Patient also states he had a greasy meal tonight which caused him to induce emesis because he felt like the food was not going down. Patient now feels like his symptoms are resolving. He denies any chest pain any time. He has no further complaints.  10 Systems reviewed and are negative for acute change except as noted in the HPI.     Past Medical History  Diagnosis Date  . Migraines   . Schizophrenia, schizo-affective   . Chronic back pain   . Knee pain, chronic   . Asthma   . COPD (chronic obstructive pulmonary disease)    Past Surgical History  Procedure Laterality Date  . Knee surgery      four times  . Extraction of wisdom teeth     No family history on file. History  Substance Use Topics  . Smoking status: Current Every Day Smoker -- 3.00 packs/day for 10 years    Types: Cigarettes    Last Attempt to Quit: 02/26/2014  . Smokeless tobacco: Not on file  . Alcohol Use: No     Comment: occ    Review of Systems    Allergies  Nsaids and Quetiapine  Home Medications   Prior to Admission  medications   Medication Sig Start Date End Date Taking? Authorizing Provider  ALPRAZolam [name]) 1 MG tablet Take 1 mg by mouth 4 (four) times daily.   Yes Historical Provider, MD  doxepin (SINEQUAN) 100 MG capsule Take 200 mg by mouth at bedtime. 12/28/14  Yes Historical Provider, MD  fluPHENAZine (PROLIXIN) 10 MG tablet Take 30 mg by mouth at bedtime. 12/28/14  Yes Historical Provider, MD  HYDROcodone-acetaminophen (NORCO) 10-325 MG per tablet Take 1 tablet by mouth every 4 (four) hours as needed for moderate pain.    Yes Historical Provider, MD  mupirocin ointment (BACTROBAN) 2 % Apply 1 application topically 3 (three) times daily as needed (to sores on scalp).  11/20/14  Yes Historical Provider, MD  baclofen (LIORESAL) 10 MG tablet Take 1 tablet (10 mg total) by mouth 3 (three) times daily as needed for muscle spasms. Patient not taking: Reported on 01/21/2015 12/22/14   Gregor Hams, MD  hydrOXYzine (ATARAX/VISTARIL) 25 MG tablet Take 1 tablet (25 mg total) by mouth 3 (three) times daily. Patient not taking: Reported on 01/21/2015 03/06/14   Niel Hummer, NP   BP 134/91 mmHg  Pulse 111  Temp(Src) 98 F (36.7 C) (Oral)  Resp 16  SpO2 96% Physical Exam  Constitutional: He is oriented to person, place, and  time. Vital signs are normal. He appears well-developed and well-nourished.  Non-toxic appearance. He does not appear ill. No distress.  Awake, alert, nontoxic appearance.  HENT:  Head: Normocephalic and atraumatic.  Nose: Nose normal.  Mouth/Throat: Oropharynx is clear and moist. No oropharyngeal exudate.  Eyes: Conjunctivae and EOM are normal. Pupils are equal, round, and reactive to light. Right eye exhibits no discharge. Left eye exhibits no discharge. No scleral icterus.  Neck: Normal range of motion. Neck supple. No tracheal deviation, no edema, no erythema and normal range of motion present. No thyroid mass and no thyromegaly present.  Cardiovascular: Normal rate, regular rhythm, S1  normal, S2 normal, normal heart sounds, intact distal pulses and normal pulses.  Exam reveals no gallop and no friction rub.   No murmur heard. Pulses:      Radial pulses are 2+ on the right side, and 2+ on the left side.       Dorsalis pedis pulses are 2+ on the right side, and 2+ on the left side.  Pulmonary/Chest: Effort normal. No respiratory distress. He has wheezes. He has no rhonchi. He has no rales. He exhibits no tenderness.  Intermittent wheezes heard in anterior lung fields.  Abdominal: Soft. Normal appearance and bowel sounds are normal. He exhibits no distension, no ascites and no mass. There is no hepatosplenomegaly. There is no tenderness. There is no rebound, no guarding and no CVA tenderness.  Musculoskeletal: Normal range of motion. He exhibits no edema or tenderness.  Baseline ROM, no obvious new focal weakness.  Lymphadenopathy:    He has no cervical adenopathy.  Neurological: He is alert and oriented to person, place, and time. He has normal strength. No cranial nerve deficit or sensory deficit.  Mental status and motor strength appears baseline for patient and situation.  Skin: Skin is warm, dry and intact. No petechiae and no rash noted. He is not diaphoretic. No erythema. No pallor.  Psychiatric:  Anxious  Nursing note and vitals reviewed.   ED Course  Procedures (including critical care time) Labs Review Labs Reviewed  BASIC METABOLIC PANEL - Abnormal; Notable for the following:    Glucose, Bld 156 (*)    All other components within normal limits  HEPATIC FUNCTION PANEL - Abnormal; Notable for the following:    AST 42 (*)    Bilirubin, Direct <0.1 (*)    All other components within normal limits  CBC  I-STAT TROPOININ, ED    Imaging Review No results found.   EKG Interpretation   Date/Time:  Friday Jan 26 2015 22:10:11 EDT Ventricular Rate:  109 PR Interval:  120 QRS Duration: 83 QT Interval:  329 QTC Calculation: 443 R Axis:   96 Text  Interpretation:  Sinus tachycardia leads likely reversed Probable  lateral infarct, age indeterminate Baseline wander in lead(s) I Confirmed  by Glynn Octave [phone]) on 01/26/2015 11:57:25 PM      MDM   Final diagnoses:  None    Patient since emergency department for shortness of breath and possible anxiety. I have low concern for ACS as the history is not consistent with this diagnosis. EKG does not show any signs of ischemia. Troponin is not clinically indicated in this case however is negative and was ordered by triage. Patient has received 2 chest x-rays this week that were negative, I do not believe another chest x-ray is warranted. He was given an ipratropium breathing treatment as he states he is allergic to albuterol.  Patient was no longer  tachycardic on my exam. This resolved without any intervention from the triage vital signs. Patient otherwise appears comfortable in no acute distress. His vital signs remain within his normal limits and he is safe for discharge with close follow-up as primary care physician within 3 days.  Everlene Balls, MD 01/27/15 0130

## 2015-01-27 NOTE — ED Notes (Signed)
Main lab will add on HFP blood work.

## 2015-03-06 ENCOUNTER — Ambulatory Visit: Payer: Self-pay

## 2015-03-06 ENCOUNTER — Encounter: Payer: Medicare Other | Admitting: Podiatry

## 2015-03-06 NOTE — Progress Notes (Signed)
This encounter was created in error - please disregard.

## 2015-04-05 ENCOUNTER — Ambulatory Visit (INDEPENDENT_AMBULATORY_CARE_PROVIDER_SITE_OTHER): Payer: Medicare Other

## 2015-04-05 ENCOUNTER — Ambulatory Visit (INDEPENDENT_AMBULATORY_CARE_PROVIDER_SITE_OTHER): Payer: Medicare Other | Admitting: Podiatry

## 2015-04-05 ENCOUNTER — Encounter: Payer: Self-pay | Admitting: Podiatry

## 2015-04-05 VITALS — BP 130/79 | HR 76 | Resp 15

## 2015-04-05 DIAGNOSIS — M205X1 Other deformities of toe(s) (acquired), right foot: Secondary | ICD-10-CM

## 2015-04-05 DIAGNOSIS — M2011 Hallux valgus (acquired), right foot: Secondary | ICD-10-CM

## 2015-04-05 NOTE — Progress Notes (Signed)
   Subjective:    Patient ID: [name], male    DOB: November 08, 1970, 44 y.o.   MRN: [id number]  HPI Pt presents with painful right foot bunion, MPJ area red and inflamed. He also has sores on top of his right foot from constantly rubbing them on chairs, he states that he has RLS  Review of Systems  Musculoskeletal: Positive for back pain and arthralgias.  All other systems reviewed and are negative.      Objective:   Physical Exam he presents today with chief complaint of pain about the first metatarsophalangeal joint of the right foot. He was referred by his primary doctor. He has a history of schizophrenic disorder. He also has a history of depression and overdose of Tegretol. The overdose was intentional. He continues to be evaluated by his psychiatrist and his primary physician on a regular basis.  Pulses are strongly palpable bilateral. Neurologic sensorium is intact per Semmes-Weinstein monofilament. Deep tendon reflexes are intact bilateral and muscle strength was 5 over 5 dorsiflexors plantar flexors and inverters and everters all intrinsic musculature is intact. Orthopedic evaluation demonstrates all joints distal to the ankle up to full range of motion without crepitation with exception of the first metatarsophalangeal joint bilateral right greater than left. Hallux valgus deformity is noted but a large hypertrophic bony mass is noted overlying the medial and dorsal medial aspect of the first metatarsophalangeal joint right foot. 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 Moderate to severe hallux valgus deformity of the right foot as well.        Assessment & Plan:  Assessment: Hallux abductovalgus deformity bilateral right greater than left. 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  Plan: Discussed etiology pathology conservative versus surgical therapies. We consented him today for  surgical correction of the first metatarsophalangeal joint regarding a total joint replacement. A Keller arthroplasty with a single silicone implant first metatarsophalangeal joint of the right foot. We discussed this in great detail today he understands that and is amenable to it. We did discuss the possible postoperative complications which may include but are not limited to postop pain bleeding swelling infection recurrence need for further surgery loss of digit loss of limb and loss of life possible DVT development or PE development. We are requesting psychiatric as well as medical clearance prior to surgery.

## 2015-04-20 ENCOUNTER — Encounter: Payer: Self-pay | Admitting: *Deleted

## 2015-05-07 ENCOUNTER — Emergency Department (HOSPITAL_COMMUNITY)
Admission: EM | Admit: 2015-05-07 | Discharge: 2015-05-07 | Discharge status: Against Medical Advice | Payer: Medicare Other | Attending: Emergency Medicine | Admitting: Emergency Medicine

## 2015-05-07 ENCOUNTER — Encounter (HOSPITAL_COMMUNITY): Payer: Self-pay | Admitting: Emergency Medicine

## 2015-05-07 DIAGNOSIS — J449 Chronic obstructive pulmonary disease, unspecified: Secondary | ICD-10-CM | POA: Insufficient documentation

## 2015-05-07 DIAGNOSIS — R4701 Aphasia: Secondary | ICD-10-CM | POA: Diagnosis not present

## 2015-05-07 DIAGNOSIS — G8929 Other chronic pain: Secondary | ICD-10-CM | POA: Insufficient documentation

## 2015-05-07 DIAGNOSIS — Z720 Tobacco use: Secondary | ICD-10-CM | POA: Diagnosis not present

## 2015-05-07 DIAGNOSIS — I509 Heart failure, unspecified: Secondary | ICD-10-CM | POA: Insufficient documentation

## 2015-05-07 LAB — COMPREHENSIVE METABOLIC PANEL
ALBUMIN: 4.6 g/dL (ref 3.5–5.0)
ALK PHOS: 64 U/L (ref 38–126)
ALT: 45 U/L (ref 17–63)
ANION GAP: 10 (ref 5–15)
AST: 32 U/L (ref 15–41)
BILIRUBIN TOTAL: 0.5 mg/dL (ref 0.3–1.2)
BUN: 8 mg/dL (ref 6–20)
CALCIUM: 9.5 mg/dL (ref 8.9–10.3)
CO2: 23 mmol/L (ref 22–32)
Chloride: 107 mmol/L (ref 101–111)
Creatinine, Ser: 0.89 mg/dL (ref 0.61–1.24)
GLUCOSE: 97 mg/dL (ref 65–99)
Potassium: 3.8 mmol/L (ref 3.5–5.1)
Sodium: 140 mmol/L (ref 135–145)
TOTAL PROTEIN: 7.7 g/dL (ref 6.5–8.1)

## 2015-05-07 LAB — DIFFERENTIAL
Basophils Absolute: 0 10*3/uL (ref 0.0–0.1)
Basophils Relative: 1 % (ref 0–1)
EOS PCT: 1 % (ref 0–5)
Eosinophils Absolute: 0 10*3/uL (ref 0.0–0.7)
LYMPHS ABS: 2.5 10*3/uL (ref 0.7–4.0)
LYMPHS PCT: 30 % (ref 12–46)
MONO ABS: 0.6 10*3/uL (ref 0.1–1.0)
MONOS PCT: 7 % (ref 3–12)
Neutro Abs: 5.3 10*3/uL (ref 1.7–7.7)
Neutrophils Relative %: 63 % (ref 43–77)

## 2015-05-07 LAB — PROTIME-INR
INR: 0.97 (ref 0.00–1.49)
Prothrombin Time: 13.1 seconds (ref 11.6–15.2)

## 2015-05-07 LAB — I-STAT CHEM 8, ED
BUN: 7 mg/dL (ref 6–20)
CREATININE: 0.9 mg/dL (ref 0.61–1.24)
Calcium, Ion: 1.19 mmol/L (ref 1.12–1.23)
Chloride: 105 mmol/L (ref 101–111)
GLUCOSE: 94 mg/dL (ref 65–99)
HCT: 49 % (ref 39.0–52.0)
HEMOGLOBIN: 16.7 g/dL (ref 13.0–17.0)
POTASSIUM: 3.8 mmol/L (ref 3.5–5.1)
Sodium: 141 mmol/L (ref 135–145)
TCO2: 23 mmol/L (ref 0–100)

## 2015-05-07 LAB — CBG MONITORING, ED: GLUCOSE-CAPILLARY: 101 mg/dL — AB (ref 65–99)

## 2015-05-07 LAB — CBC
HEMATOCRIT: 46.9 % (ref 39.0–52.0)
HEMOGLOBIN: 15.9 g/dL (ref 13.0–17.0)
MCH: 30.1 pg (ref 26.0–34.0)
MCHC: 33.9 g/dL (ref 30.0–36.0)
MCV: 88.7 fL (ref 78.0–100.0)
Platelets: 218 10*3/uL (ref 150–400)
RBC: 5.29 MIL/uL (ref 4.22–5.81)
RDW: 12.8 % (ref 11.5–15.5)
WBC: 8.5 10*3/uL (ref 4.0–10.5)

## 2015-05-07 LAB — I-STAT TROPONIN, ED: Troponin i, poc: 0 ng/mL (ref 0.00–0.08)

## 2015-05-07 LAB — APTT: aPTT: 34 seconds (ref 24–37)

## 2015-05-07 NOTE — ED Provider Notes (Signed)
MSE was initiated and I personally evaluated the patient and placed orders (if any) at  3:46 PM on May 07, 2015.  Pt reports he had slurred speech earlier but it resolved and now he wants to leave. I spoke with the pt and recommended he stay for a full evaluation. He understands this could be TIA/stroke and going home without evaluation puts him at risk for stroke and life threatening disability. He still wants to go home. i recommended he call his pcp and return to ER if he changes his mind and would like an evaluation   Jola Schmidt, MD 05/07/15 [phone]

## 2015-05-07 NOTE — ED Notes (Addendum)
Pt A+Ox4, odd affect, rambling, vague.  Sts "I think I had a stroke", pt reports slurred speech "and drooling a lot" x3 weeks.  "it might just be my teeth".  Pt reports has been seen by PCP since then "for my blood pressure problems, but I forgot to mention it".  Pt sts PCP "thinks I'm taking too much of my alprazolam".  Pt reports change in BP medications as well as change from norco to percocet "they changed the manufacturer and now I can't sleep".  "I need to be checked out thoroughly for an allergic reaction or anything else that might be going on".  Pt denies dizziness, weakness, syncope, cp/palpitations or SOB.  Speaking full/clear sentences, rr even/un-lab.  Tongue midline, no facial asymmetry, no pronator drift.  MAEI, hand grasp =, ambulatory with steady gait.  Skin PWD.  NAD.

## 2015-05-07 NOTE — ED Notes (Signed)
Pt up to nursing station stating he is ready to leave. States he feels better now that he is not smoking. MD tried to redirect pt into staying, however he refuses.

## 2015-10-22 ENCOUNTER — Telehealth: Payer: Self-pay | Admitting: Podiatry

## 2015-10-22 NOTE — Telephone Encounter (Addendum)
Patient walked in today and wanted to be put on [facility] surgery schedule. Patient stated that he already had a sx consult with Dr Milinda Pointer but there was concerns regarding him needing dental work done. Patient is now ready to schedule surgery. Please advise.

## 2015-10-24 DIAGNOSIS — T43611A Poisoning by caffeine, accidental (unintentional), initial encounter: Secondary | ICD-10-CM | POA: Insufficient documentation

## 2015-10-24 DIAGNOSIS — K529 Noninfective gastroenteritis and colitis, unspecified: Secondary | ICD-10-CM | POA: Insufficient documentation

## 2015-10-24 DIAGNOSIS — G5770 Causalgia of unspecified lower limb: Secondary | ICD-10-CM | POA: Insufficient documentation

## 2015-10-24 DIAGNOSIS — K219 Gastro-esophageal reflux disease without esophagitis: Secondary | ICD-10-CM | POA: Insufficient documentation

## 2015-10-24 DIAGNOSIS — R799 Abnormal finding of blood chemistry, unspecified: Secondary | ICD-10-CM | POA: Insufficient documentation

## 2015-10-24 DIAGNOSIS — E663 Overweight: Secondary | ICD-10-CM | POA: Insufficient documentation

## 2015-10-24 DIAGNOSIS — L700 Acne vulgaris: Secondary | ICD-10-CM | POA: Insufficient documentation

## 2015-10-24 DIAGNOSIS — Z79891 Long term (current) use of opiate analgesic: Secondary | ICD-10-CM | POA: Insufficient documentation

## 2015-10-24 DIAGNOSIS — F411 Generalized anxiety disorder: Secondary | ICD-10-CM | POA: Insufficient documentation

## 2015-10-24 DIAGNOSIS — R5383 Other fatigue: Secondary | ICD-10-CM | POA: Insufficient documentation

## 2015-10-31 ENCOUNTER — Encounter (HOSPITAL_COMMUNITY): Payer: Self-pay | Admitting: *Deleted

## 2015-10-31 ENCOUNTER — Emergency Department (HOSPITAL_COMMUNITY)
Admission: EM | Admit: 2015-10-31 | Discharge: 2015-10-31 | Disposition: A | Discharge status: Home / Self Care | Payer: Medicare Other | Attending: Emergency Medicine | Admitting: Emergency Medicine

## 2015-10-31 DIAGNOSIS — R0602 Shortness of breath: Secondary | ICD-10-CM | POA: Diagnosis not present

## 2015-10-31 DIAGNOSIS — M79602 Pain in left arm: Secondary | ICD-10-CM | POA: Diagnosis not present

## 2015-10-31 DIAGNOSIS — F17210 Nicotine dependence, cigarettes, uncomplicated: Secondary | ICD-10-CM | POA: Insufficient documentation

## 2015-10-31 DIAGNOSIS — J449 Chronic obstructive pulmonary disease, unspecified: Secondary | ICD-10-CM | POA: Diagnosis not present

## 2015-10-31 DIAGNOSIS — I509 Heart failure, unspecified: Secondary | ICD-10-CM | POA: Insufficient documentation

## 2015-10-31 DIAGNOSIS — G8929 Other chronic pain: Secondary | ICD-10-CM | POA: Diagnosis not present

## 2015-10-31 MED ORDER — ALBUTEROL SULFATE (2.5 MG/3ML) 0.083% IN NEBU
5.0000 mg | INHALATION_SOLUTION | Freq: Once | RESPIRATORY_TRACT | Status: AC
  Administered 2015-10-31: 5 mg via RESPIRATORY_TRACT

## 2015-10-31 MED ORDER — ALBUTEROL SULFATE (2.5 MG/3ML) 0.083% IN NEBU
INHALATION_SOLUTION | RESPIRATORY_TRACT | Status: AC
  Filled 2015-10-31: qty 6

## 2015-10-31 NOTE — ED Notes (Signed)
Pt c/o shortness of breath and left arm pain. States that he drank 1/3 gallon of milk and drove around town 3 times. Pt states that he has anxiety. States he took alprazolam and 81 mg aspirin. States that his symptoms have not gotten better.

## 2015-10-31 NOTE — ED Notes (Signed)
Pt states he is feeling better and is leaving

## 2015-11-13 NOTE — Telephone Encounter (Signed)
I faxed a medical clearance letter to Dr. Kathryne Eriksson again per Dr. Milinda Pointer, second request.

## 2015-11-18 ENCOUNTER — Emergency Department (INDEPENDENT_AMBULATORY_CARE_PROVIDER_SITE_OTHER)
Admission: EM | Admit: 2015-11-18 | Discharge: 2015-11-18 | Disposition: A | Discharge status: Home / Self Care | Payer: Medicare Other | Source: Home / Self Care | Attending: Family Medicine | Admitting: Family Medicine

## 2015-11-18 ENCOUNTER — Encounter (HOSPITAL_COMMUNITY): Payer: Self-pay | Admitting: Emergency Medicine

## 2015-11-18 DIAGNOSIS — M5442 Lumbago with sciatica, left side: Secondary | ICD-10-CM

## 2015-11-18 NOTE — ED Provider Notes (Signed)
CSN: [id number]     Arrival date & time 11/18/15  1515 History   First MD Initiated Contact with Patient 11/18/15 1725     Chief Complaint  Patient presents with  . Back Pain   (Consider location/radiation/quality/duration/timing/severity/associated sxs/prior Treatment) Patient is a 45 y.o. male presenting with back pain. The history is provided by the patient. No language interpreter was used.  Back Pain Location:  Lumbar spine Quality:  Aching and stabbing Radiates to:  L thigh (left lower back) Pain severity:  Moderate Pain is:  Same all the time Onset quality:  Gradual Duration: He has back pain for 8 years but worsening in the last few days. Timing:  Intermittent Context: lifting heavy objects   Context: not falling and not recent illness   Context comment:  He picked up 9 lbs ball at his son's sport event Relieved by:  Bed rest (Norco) Worsened by:  Bending Associated symptoms: no abdominal pain, no chest pain, no dysuria, no fever, no headaches, no numbness and no paresthesias   Risk factors: no hx of cancer     Past Medical History  Diagnosis Date  . Migraines   . Schizophrenia, schizo-affective ([place])   . Chronic back pain   . Knee pain, chronic   . Asthma   . COPD (chronic obstructive pulmonary disease) ([place])   . CHF (congestive heart failure) [facility])    Past Surgical History  Procedure Laterality Date  . Knee surgery      four times  . Extraction of wisdom teeth     No family history on file. Social History  Substance Use Topics  . Smoking status: Current Every Day Smoker -- 3.00 packs/day for 10 years    Types: Cigarettes    Last Attempt to Quit: 02/26/2014  . Smokeless tobacco: None  . Alcohol Use: No     Comment: occ    Review of Systems  Constitutional: Negative for fever.  Respiratory: Negative.   Cardiovascular: Negative.  Negative for chest pain.  Gastrointestinal: Negative for abdominal pain.  Genitourinary: Negative.  Negative for dysuria.   Musculoskeletal: Positive for back pain.  Neurological: Negative for numbness, headaches and paresthesias.  All other systems reviewed and are negative.   Allergies  Nsaids and Quetiapine  Home Medications   Prior to Admission medications   Medication Sig Start Date End Date Taking? Authorizing Provider  ALPRAZolam [name]) 1 MG tablet Take 1 mg by mouth 4 (four) times daily.    Historical Provider, MD  baclofen (LIORESAL) 10 MG tablet Take 1 tablet (10 mg total) by mouth 3 (three) times daily as needed for muscle spasms. Patient not taking: Reported on 04/05/2015 12/22/14   Gregor Hams, MD  doxepin (SINEQUAN) 100 MG capsule Take 200 mg by mouth at bedtime. 12/28/14   Historical Provider, MD  fluPHENAZine (PROLIXIN) 10 MG tablet Take 30 mg by mouth at bedtime. 12/28/14   Historical Provider, MD  HYDROcodone-acetaminophen (NORCO) 10-325 MG per tablet Take 1 tablet by mouth every 4 (four) hours as needed for moderate pain.     Historical Provider, MD  hydrOXYzine (ATARAX/VISTARIL) 25 MG tablet Take 1 tablet (25 mg total) by mouth 3 (three) times daily. Patient not taking: Reported on 04/05/2015 03/06/14   Niel Hummer, NP  mupirocin ointment (BACTROBAN) 2 % Apply 1 application topically 3 (three) times daily as needed (to sores on scalp).  11/20/14   Historical Provider, MD  omeprazole (PRILOSEC) 40 MG capsule Take 40 mg by mouth  daily.    Historical Provider, MD   Meds Ordered and Administered this Visit  Medications - No data to display  BP 128/84 mmHg  Pulse 61  Temp(Src) 98.3 F (36.8 C) (Oral)  SpO2 98% No data found.   Physical Exam  Constitutional: He is oriented to person, place, and time. He appears well-developed. No distress.  Cardiovascular: Normal rate, regular rhythm and normal heart sounds.   No murmur heard. Pulmonary/Chest: Effort normal and breath sounds normal. No respiratory distress. He has no wheezes.  Musculoskeletal: He exhibits no edema.       Lumbar back:  He exhibits tenderness and spasm. He exhibits normal range of motion, no swelling, no edema and no deformity.  Neg straight leg raising test  Neurological: He is oriented to person, place, and time. He has normal strength and normal reflexes. He displays normal reflexes. No cranial nerve deficit or sensory deficit. He displays a negative Romberg sign.  Nursing note and vitals reviewed.   ED Course  Procedures (including critical care time)  Labs Review Labs Reviewed - No data to display  Imaging Review No results found.   Visual Acuity Review  Right Eye Distance:   Left Eye Distance:   Bilateral Distance:    Right Eye Near:   Left Eye Near:    Bilateral Near:         MDM  No diagnosis found. Left-sided low back pain with left-sided sciatica   Likely Osteoarthritis vs spinal stenosis. No neurologic deficit. He does have some spasm of his left paraspinal muscle. Mild tenderness of his lower back. Patient stated he already has Norco and Tizanidine at home. I recommended that he continues this medications as needed and instructed him to see his PCP in 1-2 days if no improvement for imaging. He agreed with plan.    Kinnie Feil, MD 11/18/15 1742

## 2015-11-18 NOTE — Discharge Instructions (Signed)
It was nice seeing you. I am sorry about your back pain. This could be arthritis vs spinal stenosis. It seems you also have mild spasm of the muscle of the back. Please continue using your home pain medicine and Tizanidine as needed for spasm. Don't use  Your muscle relaxant when driving. See your PCP soon if no improvement for xray or MRI.

## 2015-11-18 NOTE — ED Notes (Signed)
Pt here with c/o left side lower back starting at chest area radiating down to back x 10 days Hx Chronic back pain States he took 4 doses of Hydrocodone today, last dose 3pm, and x1 Celebrex to control pain

## 2015-12-29 DIAGNOSIS — M2011 Hallux valgus (acquired), right foot: Secondary | ICD-10-CM

## 2016-03-26 DIAGNOSIS — R0602 Shortness of breath: Secondary | ICD-10-CM | POA: Insufficient documentation

## 2016-03-26 DIAGNOSIS — F17210 Nicotine dependence, cigarettes, uncomplicated: Secondary | ICD-10-CM | POA: Insufficient documentation

## 2016-04-03 ENCOUNTER — Emergency Department (HOSPITAL_COMMUNITY): Payer: Medicare Other

## 2016-04-03 ENCOUNTER — Emergency Department (HOSPITAL_COMMUNITY)
Admission: EM | Admit: 2016-04-03 | Discharge: 2016-04-03 | Disposition: A | Discharge status: Home / Self Care | Payer: Medicare Other | Attending: Emergency Medicine | Admitting: Emergency Medicine

## 2016-04-03 ENCOUNTER — Encounter (HOSPITAL_COMMUNITY): Payer: Self-pay | Admitting: *Deleted

## 2016-04-03 DIAGNOSIS — T402X1A Poisoning by other opioids, accidental (unintentional), initial encounter: Secondary | ICD-10-CM | POA: Insufficient documentation

## 2016-04-03 DIAGNOSIS — M545 Low back pain, unspecified: Secondary | ICD-10-CM

## 2016-04-03 DIAGNOSIS — T428X1A Poisoning by antiparkinsonism drugs and other central muscle-tone depressants, accidental (unintentional), initial encounter: Secondary | ICD-10-CM | POA: Diagnosis not present

## 2016-04-03 DIAGNOSIS — R1032 Left lower quadrant pain: Secondary | ICD-10-CM | POA: Insufficient documentation

## 2016-04-03 DIAGNOSIS — J449 Chronic obstructive pulmonary disease, unspecified: Secondary | ICD-10-CM | POA: Diagnosis not present

## 2016-04-03 DIAGNOSIS — I509 Heart failure, unspecified: Secondary | ICD-10-CM | POA: Diagnosis not present

## 2016-04-03 DIAGNOSIS — R001 Bradycardia, unspecified: Secondary | ICD-10-CM | POA: Insufficient documentation

## 2016-04-03 DIAGNOSIS — Z79899 Other long term (current) drug therapy: Secondary | ICD-10-CM | POA: Insufficient documentation

## 2016-04-03 DIAGNOSIS — F17210 Nicotine dependence, cigarettes, uncomplicated: Secondary | ICD-10-CM | POA: Insufficient documentation

## 2016-04-03 DIAGNOSIS — T50901A Poisoning by unspecified drugs, medicaments and biological substances, accidental (unintentional), initial encounter: Secondary | ICD-10-CM

## 2016-04-03 LAB — CBC WITH DIFFERENTIAL/PLATELET
Basophils Absolute: 0 10*3/uL (ref 0.0–0.1)
Basophils Relative: 0 %
EOS ABS: 0.1 10*3/uL (ref 0.0–0.7)
EOS PCT: 1 %
HCT: 44.2 % (ref 39.0–52.0)
HEMOGLOBIN: 14.9 g/dL (ref 13.0–17.0)
LYMPHS ABS: 2.1 10*3/uL (ref 0.7–4.0)
Lymphocytes Relative: 31 %
MCH: 30.2 pg (ref 26.0–34.0)
MCHC: 33.7 g/dL (ref 30.0–36.0)
MCV: 89.5 fL (ref 78.0–100.0)
MONOS PCT: 8 %
Monocytes Absolute: 0.6 10*3/uL (ref 0.1–1.0)
Neutro Abs: 4.2 10*3/uL (ref 1.7–7.7)
Neutrophils Relative %: 60 %
PLATELETS: 202 10*3/uL (ref 150–400)
RBC: 4.94 MIL/uL (ref 4.22–5.81)
RDW: 12.9 % (ref 11.5–15.5)
WBC: 7 10*3/uL (ref 4.0–10.5)

## 2016-04-03 LAB — BASIC METABOLIC PANEL
Anion gap: 6 (ref 5–15)
CHLORIDE: 104 mmol/L (ref 101–111)
CO2: 26 mmol/L (ref 22–32)
CREATININE: 0.81 mg/dL (ref 0.61–1.24)
Calcium: 9.3 mg/dL (ref 8.9–10.3)
GFR calc Af Amer: >60 mL/min (ref >60)
GFR calc non Af Amer: >60 mL/min (ref >60)
GLUCOSE: 95 mg/dL (ref 65–99)
Potassium: 4 mmol/L (ref 3.5–5.1)
Sodium: 136 mmol/L (ref 135–145)

## 2016-04-03 LAB — URINALYSIS, ROUTINE W REFLEX MICROSCOPIC
BILIRUBIN URINE: NEGATIVE
GLUCOSE, UA: NEGATIVE mg/dL
HGB URINE DIPSTICK: NEGATIVE
Ketones, ur: NEGATIVE mg/dL
Leukocytes, UA: NEGATIVE
Nitrite: NEGATIVE
Protein, ur: NEGATIVE mg/dL
SPECIFIC GRAVITY, URINE: 1.008 (ref 1.005–1.030)
pH: 6.5 (ref 5.0–8.0)

## 2016-04-03 MED ORDER — ACETAMINOPHEN 325 MG PO TABS
650.0000 mg | ORAL_TABLET | Freq: Once | ORAL | Status: AC
Start: 2016-04-03 — End: 2016-04-03
  Administered 2016-04-03: 650 mg via ORAL
  Filled 2016-04-03: qty 2

## 2016-04-03 MED ORDER — SODIUM CHLORIDE 0.9 % IV BOLUS (SEPSIS)
1000.0000 mL | Freq: Once | INTRAVENOUS | Status: AC
  Administered 2016-04-03: 1000 mL via INTRAVENOUS

## 2016-04-03 NOTE — ED Notes (Signed)
Pt returned from ct and placed back on monitor

## 2016-04-03 NOTE — ED Notes (Signed)
Patient ambulated independently, steady gait

## 2016-04-03 NOTE — ED Triage Notes (Addendum)
Pt reports hx of back pain. Having left side lower back pain but its now radiating around to abd. Denies urinary symptoms.

## 2016-04-03 NOTE — ED Provider Notes (Addendum)
[facility] DEPT Provider Note   CSN: [id number] Arrival date & time: 04/03/16  1121  First Provider Contact:  First MD Initiated Contact with Patient 04/03/16 1225        History   Chief Complaint Chief Complaint  Patient presents with  . Back Pain  . Abdominal Pain    HPI [name] is a 45 y.o. male presenting with severe left lower back pain. Has chronic, daily midline back pain but states this pain feels different. No acute trauma or any type of possible injury or overuse. No weakness or numbness. No bowel or bladder incontinence. Denies fevers or urinary symptoms. Feels like the pain shoots through to the front of his abdomen. Patient rates his pain as severe although it is significantly improving since he took 2 of his 7.5 mg hydrocodone and 3 of his 4 mg Zanaflex this morning. This is more than he would typically take. Denies any other co ingestants. States his mouth is currently feeling dry and requests water.  HPI  Past Medical History:  Diagnosis Date  . Asthma   . CHF (congestive heart failure) ([place])   . Chronic back pain   . COPD (chronic obstructive pulmonary disease) ([place])   . Knee pain, chronic   . Migraines   . Schizophrenia, schizo-affective [facility])     Patient Active Problem List   Diagnosis Date Noted  . Schizoaffective disorder-chronic with exacerbation ([place]) 07/29/2014  . Hallucinations 07/29/2014  . Hypokalemia 04/28/2014  . Drug overdose 04/27/2014  . Anxiety state 03/02/2014  . Benzodiazepine dependence ([place]) 03/02/2014  . Chest pain 10/11/2013  . EKG abnormalities 10/11/2013  . Chronic back pain     Past Surgical History:  Procedure Laterality Date  . extraction of wisdom teeth    . KNEE SURGERY     four times       Home Medications    Prior to Admission medications   Medication Sig Start Date End Date Taking? Authorizing Provider  acetaminophen (TYLENOL) 325 MG tablet Take 650 mg by mouth every 6 (six) hours as needed for pain.    Yes Historical Provider, MD  ALPRAZolam [name]) 1 MG tablet Take 1 mg by mouth 4 (four) times daily.   Yes Historical Provider, MD  diphenhydrAMINE (BENADRYL) 25 mg capsule Take 25 mg by mouth every 6 (six) hours as needed for allergies.   Yes Historical Provider, MD  doxepin (SINEQUAN) 100 MG capsule Take 200 mg by mouth at bedtime. 12/28/14  Yes Historical Provider, MD  fluPHENAZine (PROLIXIN) 10 MG tablet Take 20 mg by mouth at bedtime.  12/28/14  Yes Historical Provider, MD  HYDROcodone-acetaminophen (NORCO) 10-325 MG per tablet Take 1 tablet by mouth every 4 (four) hours as needed for moderate pain.    Yes Historical Provider, MD  loratadine (CLARITIN) 10 MG tablet Take 10 mg by mouth daily as needed for allergies.   Yes Historical Provider, MD  metoprolol succinate (TOPROL-XL) 25 MG 24 hr tablet Take 25 mg by mouth daily. 03/14/16  Yes Historical Provider, MD  mupirocin ointment (BACTROBAN) 2 % Apply 1 application topically 3 (three) times daily as needed (to sores on scalp).  11/20/14  Yes Historical Provider, MD  omeprazole (PRILOSEC) 40 MG capsule Take 40 mg by mouth 2 (two) times daily.    Yes Historical Provider, MD  tiZANidine (ZANAFLEX) 4 MG tablet Take 4 mg by mouth every 4 (four) hours as needed for muscle spasms. 03/31/16  Yes Historical Provider, MD  baclofen (LIORESAL)  10 MG tablet Take 1 tablet (10 mg total) by mouth 3 (three) times daily as needed for muscle spasms. Patient not taking: Reported on 04/05/2015 12/22/14   Gregor Hams, MD  hydrOXYzine (ATARAX/VISTARIL) 25 MG tablet Take 1 tablet (25 mg total) by mouth 3 (three) times daily. Patient not taking: Reported on 04/05/2015 03/06/14   Niel Hummer, NP    Family History History reviewed. No pertinent family history.  Social History Social History  Substance Use Topics  . Smoking status: Current Every Day Smoker    Packs/day: 3.00    Years: 10.00    Types: Cigarettes    Last attempt to quit: 02/26/2014  . Smokeless  tobacco: Not on file  . Alcohol use No     Comment: occ     Allergies   Amoxicillin; Dextromethorphan-guaifenesin; Haloperidol; Meloxicam; Nsaids; Prednisone; Quetiapine; and Ziprasidone   Review of Systems Review of Systems  Constitutional: Negative for fever.  Gastrointestinal: Positive for abdominal pain. Negative for diarrhea, nausea and vomiting.  Genitourinary: Negative for dysuria and hematuria.  Musculoskeletal: Positive for back pain.  Neurological: Negative for weakness and numbness.  All other systems reviewed and are negative.    Physical Exam Updated Vital Signs BP 108/70   Pulse (!) 43   Temp 97.6 F (36.4 C) (Oral)   Resp 13   SpO2 97%   Physical Exam  Constitutional: He is oriented to person, place, and time. He appears well-developed and well-nourished.  Drowsy and slowed but is awake and alert  HENT:  Head: Normocephalic and atraumatic.  Right Ear: External ear normal.  Left Ear: External ear normal.  Nose: Nose normal.  Mouth/Throat: Mucous membranes are dry.  Eyes: Right eye exhibits no discharge. Left eye exhibits no discharge.  Neck: Neck supple.  Cardiovascular: Normal rate, regular rhythm, normal heart sounds and intact distal pulses.   Pulmonary/Chest: Effort normal and breath sounds normal.  Abdominal: Soft. There is no tenderness. There is no CVA tenderness.  Musculoskeletal: He exhibits no edema.       Lumbar back: He exhibits bony tenderness (mild). He exhibits no tenderness.  Unable to reproduce his left lower back pain  Neurological: He is alert and oriented to person, place, and time. He displays no Babinski's sign on the right side. He displays no Babinski's sign on the left side.  Reflex Scores:      Patellar reflexes are 2+ on the right side and 2+ on the left side.      Achilles reflexes are 2+ on the right side and 2+ on the left side. 5/5 strength in BLE. Normal gross sensation.  Skin: Skin is warm and dry.  Psychiatric: His  speech is slurred.  Nursing note and vitals reviewed.    ED Treatments / Results  Labs (all labs ordered are listed, but only abnormal results are displayed) Labs Reviewed  BASIC METABOLIC PANEL - Abnormal; Notable for the following:       Result Value   BUN <5 (*)    All other components within normal limits  CBC WITH DIFFERENTIAL/PLATELET  URINALYSIS, ROUTINE W REFLEX MICROSCOPIC (NOT AT [facility])    EKG  EKG Interpretation  Date/Time:  Thursday April 03 2016 11:53:47 EDT Ventricular Rate:  44 PR Interval:    QRS Duration: 99 QT Interval:  450 QTC Calculation: 385 R Axis:   80 Text Interpretation:  Sinus bradycardia Baseline wander in lead(s) II V3 V4 V5 rate is slower, otherwise no significant change since Feb  2017 Confirmed by Regenia Skeeter MD, [place] [id number]) on 04/03/2016 11:58:11 AM       Radiology Ct Renal Stone Study  Result Date: 04/03/2016 CLINICAL DATA:  Left lower back and abdominal pain, 2 days duration. EXAM: CT ABDOMEN AND PELVIS WITHOUT CONTRAST TECHNIQUE: Multidetector CT imaging of the abdomen and pelvis was performed following the standard protocol without IV contrast. COMPARISON:  None. FINDINGS: Lower chest:  Normal Hepatobiliary: Normal Pancreas: Normal Spleen: Normal Adrenals/Urinary Tract: No evidence of urolithiasis or hydronephrosis. No definite mass visualized on this un-enhanced exam. Stomach/Bowel: No evidence of obstruction, inflammatory process, or abnormal fluid collections. Appendix is normal. No diverticulosis or diverticulitis. Vascular/Lymphatic: No pathologically enlarged lymph nodes. Normal appearance of the aorta and IVC. Reproductive: No mass or other significant abnormality. Other: None. Musculoskeletal:  Normal IMPRESSION: No cause of left lower back and abdominal pain identified. No evidence of urinary tract stone disease or other acute or symptomatic pathology. Electronically Signed   By: Nelson Chimes M.D.   On: 04/03/2016  13:23   Procedures Procedures (including critical care time)  Medications Ordered in ED Medications  sodium chloride 0.9 % bolus 1,000 mL (1,000 mLs Intravenous New Bag/Given 04/03/16 1254)  acetaminophen (TYLENOL) tablet 650 mg (650 mg Oral Given 04/03/16 1254)     Initial Impression / Assessment and Plan / ED Course  I have reviewed the triage vital signs and the nursing notes.  Pertinent labs & imaging results that were available during my care of the patient were reviewed by me and considered in my medical decision making (see chart for details).  Clinical Course  Comment By Time  Will get CT stone study to r/o acute intra-abd process or urinary calculus. Check urine, labs. Will give fluids and tylenol (declines toradol). Patient appears to have taken too much of his narcotic/muscle relaxant, will give fluids and observe.  He is bradycardic but not symptomatic.  BP in low 90s, both of these are probably from the zanaflex.  Sherwood Gambler, MD 07/27 1227  BP improved and has remained over 100 on multiple rechecks. He ambulated without difficulty. HR in the 50s. Will discharge, discussed being more careful with meds, f/u with PCP and return precautions. Sherwood Gambler, MD 07/27 1517   Patient denies SI, was just looking for pain relief. Highly doubt spinal emergency, normal gait and neuro exam.  Final Clinical Impressions(s) / ED Diagnoses   Final diagnoses:  Left-sided low back pain without sciatica  Sinus bradycardia  Accidental overdose, initial encounter    New Prescriptions New Prescriptions   No medications on file     Sherwood Gambler, MD 04/03/16 [place], MD 04/03/16 1524

## 2016-08-13 DIAGNOSIS — G8929 Other chronic pain: Secondary | ICD-10-CM | POA: Insufficient documentation

## 2016-09-04 ENCOUNTER — Encounter (INDEPENDENT_AMBULATORY_CARE_PROVIDER_SITE_OTHER): Payer: Medicare Other | Admitting: Podiatry

## 2016-09-04 NOTE — Progress Notes (Signed)
This encounter was created in error - please disregard.

## 2017-02-14 ENCOUNTER — Emergency Department (HOSPITAL_COMMUNITY)
Admission: EM | Admit: 2017-02-14 | Discharge: 2017-02-16 | Disposition: A | Discharge status: Other Acute Inpatient Hospital | Payer: Medicare Other | Attending: Emergency Medicine | Admitting: Emergency Medicine

## 2017-02-14 DIAGNOSIS — R45851 Suicidal ideations: Secondary | ICD-10-CM

## 2017-02-14 DIAGNOSIS — Z888 Allergy status to other drugs, medicaments and biological substances status: Secondary | ICD-10-CM | POA: Insufficient documentation

## 2017-02-14 DIAGNOSIS — R45850 Homicidal ideations: Secondary | ICD-10-CM | POA: Diagnosis present

## 2017-02-14 DIAGNOSIS — Z881 Allergy status to other antibiotic agents status: Secondary | ICD-10-CM | POA: Insufficient documentation

## 2017-02-14 DIAGNOSIS — F22 Delusional disorders: Secondary | ICD-10-CM | POA: Diagnosis not present

## 2017-02-14 DIAGNOSIS — F17210 Nicotine dependence, cigarettes, uncomplicated: Secondary | ICD-10-CM | POA: Insufficient documentation

## 2017-02-14 DIAGNOSIS — F259 Schizoaffective disorder, unspecified: Secondary | ICD-10-CM | POA: Diagnosis not present

## 2017-02-14 DIAGNOSIS — F29 Unspecified psychosis not due to a substance or known physiological condition: Secondary | ICD-10-CM | POA: Diagnosis not present

## 2017-02-14 DIAGNOSIS — F250 Schizoaffective disorder, bipolar type: Secondary | ICD-10-CM | POA: Diagnosis present

## 2017-02-14 NOTE — ED Notes (Signed)
Bed: [facility]PT4 Expected date:  Expected time:  Means of arrival:  Comments: 

## 2017-02-15 ENCOUNTER — Encounter (HOSPITAL_COMMUNITY): Payer: Self-pay | Admitting: Nurse Practitioner

## 2017-02-15 DIAGNOSIS — R45850 Homicidal ideations: Secondary | ICD-10-CM | POA: Diagnosis not present

## 2017-02-15 DIAGNOSIS — F29 Unspecified psychosis not due to a substance or known physiological condition: Secondary | ICD-10-CM

## 2017-02-15 DIAGNOSIS — Z888 Allergy status to other drugs, medicaments and biological substances status: Secondary | ICD-10-CM

## 2017-02-15 DIAGNOSIS — Z79899 Other long term (current) drug therapy: Secondary | ICD-10-CM | POA: Diagnosis not present

## 2017-02-15 DIAGNOSIS — F17210 Nicotine dependence, cigarettes, uncomplicated: Secondary | ICD-10-CM

## 2017-02-15 DIAGNOSIS — Z7982 Long term (current) use of aspirin: Secondary | ICD-10-CM

## 2017-02-15 DIAGNOSIS — Z881 Allergy status to other antibiotic agents status: Secondary | ICD-10-CM

## 2017-02-15 DIAGNOSIS — Z886 Allergy status to analgesic agent status: Secondary | ICD-10-CM

## 2017-02-15 DIAGNOSIS — R45851 Suicidal ideations: Secondary | ICD-10-CM | POA: Diagnosis not present

## 2017-02-15 DIAGNOSIS — F22 Delusional disorders: Secondary | ICD-10-CM

## 2017-02-15 LAB — COMPREHENSIVE METABOLIC PANEL
ALT: 45 U/L (ref 17–63)
ANION GAP: 9 (ref 5–15)
AST: 44 U/L — ABNORMAL HIGH (ref 15–41)
Albumin: 4.5 g/dL (ref 3.5–5.0)
Alkaline Phosphatase: 64 U/L (ref 38–126)
BUN: <5 mg/dL — ABNORMAL LOW (ref 6–20)
CO2: 29 mmol/L (ref 22–32)
Calcium: 9.5 mg/dL (ref 8.9–10.3)
Chloride: 105 mmol/L (ref 101–111)
Creatinine, Ser: 1.01 mg/dL (ref 0.61–1.24)
Glucose, Bld: 115 mg/dL — ABNORMAL HIGH (ref 65–99)
POTASSIUM: 3.5 mmol/L (ref 3.5–5.1)
Sodium: 143 mmol/L (ref 135–145)
TOTAL PROTEIN: 7.7 g/dL (ref 6.5–8.1)
Total Bilirubin: 0.6 mg/dL (ref 0.3–1.2)

## 2017-02-15 LAB — CBC
HCT: 44.9 % (ref 39.0–52.0)
Hemoglobin: 15.3 g/dL (ref 13.0–17.0)
MCH: 30.1 pg (ref 26.0–34.0)
MCHC: 34.1 g/dL (ref 30.0–36.0)
MCV: 88.2 fL (ref 78.0–100.0)
PLATELETS: 218 10*3/uL (ref 150–400)
RBC: 5.09 MIL/uL (ref 4.22–5.81)
RDW: 13.1 % (ref 11.5–15.5)
WBC: 10.8 10*3/uL — AB (ref 4.0–10.5)

## 2017-02-15 LAB — RAPID URINE DRUG SCREEN, HOSP PERFORMED
AMPHETAMINES: NOT DETECTED
BENZODIAZEPINES: POSITIVE — AB
Barbiturates: NOT DETECTED
Cocaine: NOT DETECTED
OPIATES: NOT DETECTED
Tetrahydrocannabinol: NOT DETECTED

## 2017-02-15 LAB — BRAIN NATRIURETIC PEPTIDE: B Natriuretic Peptide: 27 pg/mL (ref 0.0–100.0)

## 2017-02-15 LAB — ACETAMINOPHEN LEVEL: Acetaminophen (Tylenol), Serum: <10 ug/mL — ABNORMAL LOW (ref 10–30)

## 2017-02-15 LAB — SALICYLATE LEVEL

## 2017-02-15 LAB — ETHANOL

## 2017-02-15 MED ORDER — DIPHENHYDRAMINE HCL 25 MG PO CAPS
25.0000 mg | ORAL_CAPSULE | Freq: Two times a day (BID) | ORAL | Status: DC | PRN
  Administered 2017-02-15: 25 mg via ORAL
  Filled 2017-02-15: qty 1

## 2017-02-15 MED ORDER — DOXEPIN HCL 50 MG PO CAPS
75.0000 mg | ORAL_CAPSULE | Freq: Every day | ORAL | Status: DC
  Administered 2017-02-15: 75 mg via ORAL
  Filled 2017-02-15: qty 1

## 2017-02-15 MED ORDER — MAGNESIUM HYDROXIDE 400 MG/5ML PO SUSP
15.0000 mL | Freq: Every day | ORAL | Status: DC | PRN
  Filled 2017-02-15: qty 30

## 2017-02-15 MED ORDER — PANTOPRAZOLE SODIUM 40 MG PO TBEC
40.0000 mg | DELAYED_RELEASE_TABLET | Freq: Every day | ORAL | Status: DC
  Administered 2017-02-15 – 2017-02-16 (×2): 40 mg via ORAL
  Filled 2017-02-15 (×2): qty 1

## 2017-02-15 MED ORDER — PERPHENAZINE 4 MG PO TABS
8.0000 mg | ORAL_TABLET | Freq: Every day | ORAL | Status: DC
  Administered 2017-02-15: 8 mg via ORAL
  Filled 2017-02-15: qty 2

## 2017-02-15 MED ORDER — FAMOTIDINE 20 MG PO TABS
20.0000 mg | ORAL_TABLET | Freq: Once | ORAL | Status: AC
  Administered 2017-02-15: 20 mg via ORAL
  Filled 2017-02-15: qty 1

## 2017-02-15 MED ORDER — HYDROCODONE-ACETAMINOPHEN 7.5-325 MG PO TABS
1.0000 | ORAL_TABLET | Freq: Four times a day (QID) | ORAL | Status: DC | PRN
  Administered 2017-02-15 – 2017-02-16 (×3): 1 via ORAL
  Filled 2017-02-15 (×3): qty 1

## 2017-02-15 MED ORDER — PERPHENAZINE 4 MG PO TABS
4.0000 mg | ORAL_TABLET | Freq: Every day | ORAL | Status: DC
  Administered 2017-02-15 – 2017-02-16 (×2): 4 mg via ORAL
  Filled 2017-02-15 (×2): qty 1

## 2017-02-15 MED ORDER — LORAZEPAM 1 MG PO TABS: 2.0000 mg | ORAL_TABLET | Freq: Three times a day (TID) | ORAL | Status: DC

## 2017-02-15 MED ORDER — ASPIRIN 81 MG PO CHEW
81.0000 mg | CHEWABLE_TABLET | Freq: Every day | ORAL | Status: DC
  Administered 2017-02-15 – 2017-02-16 (×2): 81 mg via ORAL
  Filled 2017-02-15 (×2): qty 1

## 2017-02-15 MED ORDER — LORAZEPAM 1 MG PO TABS
2.0000 mg | ORAL_TABLET | Freq: Three times a day (TID) | ORAL | Status: DC
  Administered 2017-02-15 – 2017-02-16 (×3): 2 mg via ORAL
  Filled 2017-02-15 (×3): qty 2

## 2017-02-15 NOTE — ED Notes (Signed)
Patient intrusive tonight when speaking with another peer. Wanting to know his perphenazine dosage. Reports hearing voices constantly and xanax helps control them. Patient not happy with [facility] accepting him. Wanting to go to [facility]. Currently denies any SI or HI.

## 2017-02-15 NOTE — Consult Note (Signed)
[facility] Consult   Reason for Consult:  Psychosis, SI, HI Referring Physician:  Psych ED Patient Identification: [name] Schnoor MRN:  [id number] Principal Diagnosis: <principal problem not specified> Diagnosis:   Patient Active Problem List   Diagnosis Date Noted  . Schizoaffective disorder-chronic with exacerbation ([place]) [F25.8] 07/29/2014  . Hallucinations [R44.3] 07/29/2014  . Hypokalemia [E87.6] 04/28/2014  . Drug overdose [T50.901A] 04/27/2014  . Anxiety state [F41.1] 03/02/2014  . Benzodiazepine dependence ([place]) [F13.20] 03/02/2014  . Chest pain [R07.9] 10/11/2013  . EKG abnormalities [R94.31] 10/11/2013  . Chronic back pain [M54.9, G89.29]    Total Time spent with patient: 20 minutes  Subjective:   [name] Koudelka displays poor insight into hospitalization. Is paranoid and angry with mom for sending him here. Reports that he did make violent and threatening remarks. Reports that this was because he went to the beach with mom and that he got sick of her during the trip.  He calls her fowl language.  Demands to be discharged. Regarding meds, admits he wasn't taking his perphenazine because he lost the bottle.  I asked if he needs refills and he says that he ended up find it later.  History is unclear and he is fairly disorganized in communication.  Reviewed collateral as gathered from overnight TTS.   Past Psychiatric History: See intake H&P for full details. Reviewed, with no updates at this time.  Risk to Self: Suicidal Ideation: No-Not Currently/Within Last 6 Months (per IVC, pt made SI, pt denies this at present) Suicidal Intent: No-Not Currently/Within Last 6 Months (per IVC) Is patient at risk for suicide?: Yes Suicidal Plan?: Yes-Currently Present Specify Current Suicidal Plan: Per IVC, the pt reported to his mother that he wanted to bang his head until his brains come out. The pt denies this during the assessment  Access to Means: Yes Specify Access to  Suicidal Means: pt has access to items that could be used to harm himself  What has been your use of drugs/alcohol within the last 12 months?: denies use  Triggers for Past Attempts: None known Intentional Self Injurious Behavior: None Risk to Others: Homicidal Ideation: No-Not Currently/Within Last 6 Months Thoughts of Harm to Others: No-Not Currently Present/Within Last 6 Months Current Homicidal Intent: No-Not Currently/Within Last 6 Months Current Homicidal Plan: No-Not Currently/Within Last 6 Months Access to Homicidal Means: No History of harm to others?: No Assessment of Violence: None Noted Does patient have access to weapons?: Yes (Comment) (pt states he has access to guns but he has no desire to use ) Criminal Charges Pending?: Yes Describe Pending Criminal Charges: pt inaudible but states he has court and he is unsure when.  Does patient have a court date: Yes Court Date:  (pt unsure ) Prior Inpatient Therapy: Prior Inpatient Therapy: Yes Prior Therapy Dates: MULTIPLE Prior Therapy Facilty/Provider(s): [facility] Reason for Treatment: SCHIZOPHRENIA Prior Outpatient Therapy: Prior Outpatient Therapy: Yes Prior Therapy Dates: 2018 Prior Therapy Facilty/Provider(s): [facility] Reason for Treatment: SCHIZOPHRENIA Does patient have an ACCT team?: No Does patient have Intensive In-House Services?  : No Does patient have [facility] services? : No Does patient have P4CC services?: No  Past Medical History:  Past Medical History:  Diagnosis Date  . Asthma   . CHF (congestive heart failure) ([place])   . Chronic back pain   . COPD (chronic obstructive pulmonary disease) ([place])   . Knee pain, chronic   . Migraines   . Schizophrenia, schizo-affective ([place])     Past  Surgical History:  Procedure Laterality Date  . extraction of wisdom teeth    . KNEE SURGERY     four times   Family History: History reviewed. No pertinent family history. Family Psychiatric  History: See intake H&P for  full details. Reviewed, with no updates at this time.  Social History:  History  Alcohol Use No    Comment: occ     History  Drug Use No    Social History   Social History  . Marital status: Single    Spouse name: N/A  . Number of children: N/A  . Years of education: N/A   Social History Main Topics  . Smoking status: Current Every Day Smoker    Packs/day: 3.00    Years: 10.00    Types: Cigarettes    Last attempt to quit: 02/26/2014  . Smokeless tobacco: Never Used  . Alcohol use No     Comment: occ  . Drug use: No  . Sexual activity: No   Other Topics Concern  . None   Social History Narrative  . None   Additional Social History:    Allergies:   Allergies  Allergen Reactions  . Amoxicillin Diarrhea    Has patient had a PCN reaction causing immediate rash, facial/tongue/throat swelling, SOB or lightheadedness with hypotension: No Has patient had a PCN reaction causing severe rash involving mucus membranes or skin necrosis: No Has patient had a PCN reaction that required hospitalization: No Has patient had a PCN reaction occurring within the last 10 years: No If all of the above answers are "NO", then may proceed with Cephalosporin use.   Marland Kitchen Dextromethorphan-Guaifenesin Other (See Comments)    unspecified  . Haloperidol Other (See Comments)    unspecified  . Meloxicam Other (See Comments)    unspecified  . Nsaids Other (See Comments)    Rectal bleeding  . Prednisone Other (See Comments)    unspecified  . Quetiapine Other (See Comments)    Psychosis with high dose (653m)  . Ziprasidone Other (See Comments)    unspecified    Labs:  Results for orders placed or performed during the hospital encounter of 02/14/17 (from the past 48 hour(s))  Rapid urine drug screen (hospital performed)     Status: Abnormal   Collection Time: 02/15/17 12:00 AM  Result Value Ref Range   Opiates NONE DETECTED NONE DETECTED   Cocaine NONE DETECTED NONE DETECTED    Benzodiazepines POSITIVE (A) NONE DETECTED   Amphetamines NONE DETECTED NONE DETECTED   Tetrahydrocannabinol NONE DETECTED NONE DETECTED   Barbiturates NONE DETECTED NONE DETECTED    Comment:        DRUG SCREEN FOR MEDICAL PURPOSES ONLY.  IF CONFIRMATION IS NEEDED FOR ANY PURPOSE, NOTIFY LAB WITHIN 5 DAYS.        LOWEST DETECTABLE LIMITS FOR URINE DRUG SCREEN Drug Class       Cutoff (ng/mL) Amphetamine      1000 Barbiturate      200 Benzodiazepine   2287Tricyclics       3867Opiates          300 Cocaine          300 THC              50   Comprehensive metabolic panel     Status: Abnormal   Collection Time: 02/15/17 12:54 AM  Result Value Ref Range   Sodium 143 135 - 145 mmol/L   Potassium 3.5 3.5 - 5.1  mmol/L   Chloride 105 101 - 111 mmol/L   CO2 29 22 - 32 mmol/L   Glucose, Bld 115 (H) 65 - 99 mg/dL   BUN <5 (L) 6 - 20 mg/dL   Creatinine, Ser 1.01 0.61 - 1.24 mg/dL   Calcium 9.5 8.9 - 10.3 mg/dL   Total Protein 7.7 6.5 - 8.1 g/dL   Albumin 4.5 3.5 - 5.0 g/dL   AST 44 (H) 15 - 41 U/L   ALT 45 17 - 63 U/L   Alkaline Phosphatase 64 38 - 126 U/L   Total Bilirubin 0.6 0.3 - 1.2 mg/dL   GFR calc non Af Amer >60 >60 mL/min   GFR calc Af Amer >60 >60 mL/min    Comment: (NOTE) The eGFR has been calculated using the CKD EPI equation. This calculation has not been validated in all clinical situations. eGFR's persistently <60 mL/min signify possible Chronic Kidney Disease.    Anion gap 9 5 - 15  Ethanol     Status: None   Collection Time: 02/15/17 12:54 AM  Result Value Ref Range   Alcohol, Ethyl (B) <5 <5 mg/dL    Comment:        LOWEST DETECTABLE LIMIT FOR SERUM ALCOHOL IS 5 mg/dL FOR MEDICAL PURPOSES ONLY   Salicylate level     Status: None   Collection Time: 02/15/17 12:54 AM  Result Value Ref Range   Salicylate Lvl <2.9 2.8 - 30.0 mg/dL  Acetaminophen level     Status: Abnormal   Collection Time: 02/15/17 12:54 AM  Result Value Ref Range   Acetaminophen  (Tylenol), Serum <10 (L) 10 - 30 ug/mL    Comment:        THERAPEUTIC CONCENTRATIONS VARY SIGNIFICANTLY. A RANGE OF 10-30 ug/mL MAY BE AN EFFECTIVE CONCENTRATION FOR MANY PATIENTS. HOWEVER, SOME ARE BEST TREATED AT CONCENTRATIONS OUTSIDE THIS RANGE. ACETAMINOPHEN CONCENTRATIONS >150 ug/mL AT 4 HOURS AFTER INGESTION AND >50 ug/mL AT 12 HOURS AFTER INGESTION ARE OFTEN ASSOCIATED WITH TOXIC REACTIONS.   cbc     Status: Abnormal   Collection Time: 02/15/17 12:54 AM  Result Value Ref Range   WBC 10.8 (H) 4.0 - 10.5 K/uL   RBC 5.09 4.22 - 5.81 MIL/uL   Hemoglobin 15.3 13.0 - 17.0 g/dL   HCT 44.9 39.0 - 52.0 %   MCV 88.2 78.0 - 100.0 fL   MCH 30.1 26.0 - 34.0 pg   MCHC 34.1 30.0 - 36.0 g/dL   RDW 13.1 11.5 - 15.5 %   Platelets 218 150 - 400 K/uL  Brain natriuretic peptide     Status: None   Collection Time: 02/15/17  1:20 AM  Result Value Ref Range   B Natriuretic Peptide 27.0 0.0 - 100.0 pg/mL    Current Facility-Administered Medications  Medication Dose Route Frequency Provider Last Rate Last Dose  . aspirin chewable tablet 81 mg  81 mg Oral Daily Aundra Dubin, MD      . doxepin [facility]) capsule 75 mg  75 mg Oral QHS Aundra Dubin, MD      . HYDROcodone-acetaminophen [facility] 7.5-325 MG per tablet 1 tablet  1 tablet Oral Q6H PRN Aundra Dubin, MD      . LORazepam (ATIVAN) tablet 2 mg  2 mg Oral Q8H Eksir, Richard Miu, MD      . magnesium hydroxide (MILK OF MAGNESIA) suspension 15 mL  15 mL Oral Daily PRN Daron Offer Richard Miu, MD      . pantoprazole (PROTONIX) EC  tablet 40 mg  40 mg Oral Daily Aundra Dubin, MD      . perphenazine (TRILAFON) tablet 4 mg  4 mg Oral Daily Aundra Dubin, MD      . perphenazine (TRILAFON) tablet 8 mg  8 mg Oral QHS Berton Mount, [facility]       Current Outpatient Prescriptions  Medication Sig Dispense Refill  . acetaminophen (TYLENOL) 325 MG tablet Take 650 mg by mouth every 6 (six) hours as needed for  mild pain, moderate pain or headache.     . ALPRAZolam (XANAX) 1 MG tablet Take 1 mg by mouth 4 (four) times daily.     Marland Kitchen aspirin 81 MG chewable tablet Chew 81 mg by mouth daily.    Marland Kitchen doxepin (SINEQUAN) 75 MG capsule Take 150 mg by mouth at bedtime.  0  . fluPHENAZine (PROLIXIN) 10 MG tablet Take 15 mg by mouth once.    Marland Kitchen HYDROcodone-acetaminophen (NORCO) 7.5-325 MG tablet Take 1 tablet by mouth every 6 (six) hours as needed for moderate pain or severe pain.   0  . magnesium hydroxide (MILK OF MAGNESIA) 400 MG/5ML suspension Take 15 mLs by mouth daily as needed for mild constipation, moderate constipation, heartburn or indigestion.    Marland Kitchen omeprazole (PRILOSEC) 40 MG capsule Take 40 mg by mouth 2 (two) times daily.     Marland Kitchen perphenazine (TRILAFON) 4 MG tablet Take 4-8 mg by mouth 2 (two) times daily. 4 mg every morning and 8 mg at bedtime  0  . baclofen (LIORESAL) 10 MG tablet Take 1 tablet (10 mg total) by mouth 3 (three) times daily as needed for muscle spasms. (Patient not taking: Reported on 04/05/2015) 60 each 0  . hydrOXYzine (ATARAX/VISTARIL) 25 MG tablet Take 1 tablet (25 mg total) by mouth 3 (three) times daily. (Patient not taking: Reported on 04/05/2015) 30 tablet 0   Musculoskeletal: Strength & Muscle Tone: within normal limits Gait & Station: normal Patient leans: N/A  Psychiatric Specialty Exam: Physical Exam  ROS  Blood pressure (!) 133/94, pulse 75, temperature 97.6 F (36.4 C), temperature source Oral, resp. rate 18, SpO2 98 %.There is no height or weight on file to calculate BMI.  General Appearance: Bizarre and Disheveled  Eye Contact:  Fair  Speech:  Clear and Coherent  Volume:  Normal  Mood:  Depressed and Irritable  Affect:  Depressed and irritable  Thought Process:  Disorganized  Orientation:  Full (Time, Place, and Person)  Thought Content:  Logical  Suicidal Thoughts:  threats of SI per family collateral  Homicidal Thoughts:  per collateral  Memory:  Immediate;    Fair  Judgement:  Impaired  Insight:  Lacking  Psychomotor Activity:  Normal  Concentration:  Concentration: Fair  Recall:  NA  Fund of Knowledge:  Fair  Language:  Fair  Akathisia:  Negative  Handed:  Right  AIMS (if indicated):     Assets:  Agricultural consultant Housing Social Support  ADL's:  Intact  Cognition:  WNL  Sleep:      Treatment Plan Summary: [name] is a 46 y.o. male patient admitted with increased paranoia, SI and HI, with specific comments that he was going blow up the houses on his block, and bang his head into the wall until his brains come out.  He shares that he lost his antipsychotic, but then says that he found it.  He denies any acute SI at this time, and denies any desire to harm  others.  However, the collateral as gathered from family is quite concerning and at his own admission, he has been off psychiatric medications for the past week.  He would benefit from psychiatric hospitalization for crisis management and perhaps consideration of LAI.  Restart home medications as above  (Home xanax discontinued in favor of ativan) Daily contact with patient to assess and evaluate symptoms and progress in treatment and Medication management  Disposition: IVC completed, refer for psychiatric hospitalization   Aundra Dubin, MD 02/15/2017 10:31 AM
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
## 2017-02-15 NOTE — ED Notes (Signed)
Nursing note - Pt remains delusional with religous preoccupation. " My brain is on fire and people don't understand me." Pt denied hearing voices initially but after taking Trilafon stated, " I told you that medication makes me hear voices now I'm hearing them telling me not to listen to you."

## 2017-02-15 NOTE — Progress Notes (Signed)
Per staff member Marita [place], Patient accepted to [facility] on 02/16/2017 after 10am. Accepting doctor is Dr. Selinda Flavin and patient's RN can call report to [phone], ask for admissions.   Abundio Miu, [facility]  Clinical Social Worker [phone]

## 2017-02-15 NOTE — ED Notes (Signed)
Pt awake, alert & responsive, no distress noted, calm & cooperative with slightly slurred speech, presents under IVC, complaint by family of not sleeping in several days and making statements that he would bang his head against the wall until his brains come out.  Pt has history of Schizophrenia.  Pt poor historian, reports he doesn't know why he is here.  Monitoring for safety, Q 15 min checks in effect.  Safety check for contraband completed, no items found.

## 2017-02-15 NOTE — ED Notes (Signed)
Pt yelling, " I'm going to show out if you don't give me xanax, that is the only thing that controls my voices"

## 2017-02-15 NOTE — Progress Notes (Signed)
Per Dr. Daron Offer and NP [place] patient meets inpatient criteria. CSW faxed patient's referral to: [facility], [place], [place], [place] and [place].   Abundio Miu, [facility]  Clinical Social Worker [phone]
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
## 2017-02-15 NOTE — ED Provider Notes (Signed)
[facility] DEPT Provider Note   CSN: [id number] Arrival date & time: 02/14/17  2350  Time seen 01:05 AM   History   Chief Complaint Chief Complaint  Patient presents with  . Homicidal  . IVC  . Paranoid    HPI [name] is a 46 y.o. male.  HPI  Pt has a hx of schizophrenia, states he is taking his medications. When I enter the room and ask how he is doing he states "I'm getting sleepy". He does admit to hearing voices and states he was at the beach this weekend and he was talking out loud to the voices. He states he was "talking nonstop" and was trying to discuss the girls in his life and make a choice. He denies feeling depressed or having suicidal or homicidal ideation. His mother filled out IVC papers tonight. She states he hasn't been sleeping for days and he was threatening to hit his head on the wall until his brains came out and threatened to destroy all the houses in his neighborhood.  He denies chest pain or shortness of breath.  Psychiatry  [place]  Past Medical History:  Diagnosis Date  . Asthma   . CHF (congestive heart failure) ([place])   . Chronic back pain   . COPD (chronic obstructive pulmonary disease) ([place])   . Knee pain, chronic   . Migraines   . Schizophrenia, schizo-affective [facility])     Patient Active Problem List   Diagnosis Date Noted  . Schizoaffective disorder-chronic with exacerbation ([place]) 07/29/2014  . Hallucinations 07/29/2014  . Hypokalemia 04/28/2014  . Drug overdose 04/27/2014  . Anxiety state 03/02/2014  . Benzodiazepine dependence ([place]) 03/02/2014  . Chest pain 10/11/2013  . EKG abnormalities 10/11/2013  . Chronic back pain     Past Surgical History:  Procedure Laterality Date  . extraction of wisdom teeth    . KNEE SURGERY     four times       Home Medications    Prior to Admission medications   Medication Sig Start Date End Date Taking? Authorizing Provider  acetaminophen (TYLENOL) 325 MG tablet Take  650 mg by mouth every 6 (six) hours as needed for mild pain, moderate pain or headache.    Yes [provider]  ALPRAZolam [name]) 1 MG tablet Take 1 mg by mouth 4 (four) times daily.  12/30/16  Yes [provider]  aspirin 81 MG chewable tablet Chew 81 mg by mouth daily.   Yes [provider]  doxepin (SINEQUAN) 75 MG capsule Take 150 mg by mouth at bedtime. 02/09/17  Yes [provider]  fluPHENAZine (PROLIXIN) 10 MG tablet Take 15 mg by mouth once.   Yes [provider]  HYDROcodone-acetaminophen (NORCO) 7.5-325 MG tablet Take 1 tablet by mouth every 6 (six) hours as needed for moderate pain or severe pain.  01/22/17  Yes [provider]  magnesium hydroxide (MILK OF MAGNESIA) 400 MG/5ML suspension Take 15 mLs by mouth daily as needed for mild constipation, moderate constipation, heartburn or indigestion.   Yes [provider]  omeprazole (PRILOSEC) 40 MG capsule Take 40 mg by mouth 2 (two) times daily.    Yes [provider]  perphenazine (TRILAFON) 4 MG tablet Take 4-8 mg by mouth 2 (two) times daily. 4 mg every morning and 8 mg at bedtime 01/30/17  Yes [provider]  baclofen (LIORESAL) 10 MG tablet Take 1 tablet (10 mg total) by mouth 3 (three) times daily as  needed for muscle spasms. Patient not taking: Reported on 04/05/2015 12/22/14   Gregor Hams, MD  hydrOXYzine (ATARAX/VISTARIL) 25 MG tablet Take 1 tablet (25 mg total) by mouth 3 (three) times daily. Patient not taking: Reported on 04/05/2015 03/06/14   Niel Hummer, NP    Family History History reviewed. No pertinent family history.  Social History Social History  Substance Use Topics  . Smoking status: Current Every Day Smoker    Packs/day: 3.00    Years: 10.00    Types: Cigarettes    Last attempt to quit: 02/26/2014  . Smokeless tobacco: Never Used  . Alcohol use No     Comment: occ  lives alone   Allergies   Amoxicillin;  Dextromethorphan-guaifenesin; Haloperidol; Meloxicam; Nsaids; Prednisone; Quetiapine; and Ziprasidone   Review of Systems Review of Systems  All other systems reviewed and are negative.    Physical Exam Updated Vital Signs BP (!) 147/105 (BP Location: Left Arm)   Pulse 92   Temp 98.5 F (36.9 C) (Oral)   Resp 18   SpO2 99%   Vital signs normal except diastolic hypertension   Physical Exam  Constitutional: He is oriented to person, place, and time. He appears well-developed and well-nourished.  Non-toxic appearance. He does not appear ill. No distress.  HENT:  Head: Normocephalic and atraumatic.  Right Ear: External ear normal.  Left Ear: External ear normal.  Nose: Nose normal. No mucosal edema or rhinorrhea.  Mouth/Throat: Oropharynx is clear and moist and mucous membranes are normal. No dental abscesses or uvula swelling.  Eyes: Conjunctivae and EOM are normal. Pupils are equal, round, and reactive to light.  Neck: Normal range of motion and full passive range of motion without pain. Neck supple.  Cardiovascular: Normal rate, regular rhythm and normal heart sounds.  Exam reveals no gallop and no friction rub.   No murmur heard. Pulmonary/Chest: Effort normal and breath sounds normal. No respiratory distress. He has no wheezes. He has no rhonchi. He has no rales. He exhibits no tenderness and no crepitus.  Abdominal: Soft. Normal appearance and bowel sounds are normal. He exhibits no distension. There is no tenderness. There is no rebound and no guarding.  Musculoskeletal: Normal range of motion. He exhibits no edema or tenderness.  Moves all extremities well.   Neurological: He is alert and oriented to person, place, and time. He has normal strength. No cranial nerve deficit.  Skin: Skin is warm, dry and intact. No rash noted. No erythema. No pallor.  Psychiatric: His speech is normal and behavior is normal. His mood appears not anxious.  Nursing note and vitals  reviewed.    ED Treatments / Results  Labs (all labs ordered are listed, but only abnormal results are displayed) Results for orders placed or performed during the hospital encounter of 02/14/17  Comprehensive metabolic panel  Result Value Ref Range   Sodium 143 135 - 145 mmol/L   Potassium 3.5 3.5 - 5.1 mmol/L   Chloride 105 101 - 111 mmol/L   CO2 29 22 - 32 mmol/L   Glucose, Bld 115 (H) 65 - 99 mg/dL   BUN <5 (L) 6 - 20 mg/dL   Creatinine, Ser 1.01 0.61 - 1.24 mg/dL   Calcium 9.5 8.9 - 10.3 mg/dL   Total Protein 7.7 6.5 - 8.1 g/dL   Albumin 4.5 3.5 - 5.0 g/dL   AST 44 (H) 15 - 41 U/L   ALT 45 17 - 63 U/L   Alkaline Phosphatase 64  38 - 126 U/L   Total Bilirubin 0.6 0.3 - 1.2 mg/dL   GFR calc non Af Amer >60 >60 mL/min   GFR calc Af Amer >60 >60 mL/min   Anion gap 9 5 - 15  Ethanol  Result Value Ref Range   Alcohol, Ethyl (B) <5 <5 mg/dL  Salicylate level  Result Value Ref Range   Salicylate Lvl <1.6 2.8 - 30.0 mg/dL  Acetaminophen level  Result Value Ref Range   Acetaminophen (Tylenol), Serum <10 (L) 10 - 30 ug/mL  cbc  Result Value Ref Range   WBC 10.8 (H) 4.0 - 10.5 K/uL   RBC 5.09 4.22 - 5.81 MIL/uL   Hemoglobin 15.3 13.0 - 17.0 g/dL   HCT 44.9 39.0 - 52.0 %   MCV 88.2 78.0 - 100.0 fL   MCH 30.1 26.0 - 34.0 pg   MCHC 34.1 30.0 - 36.0 g/dL   RDW 13.1 11.5 - 15.5 %   Platelets 218 150 - 400 K/uL  Rapid urine drug screen (hospital performed)  Result Value Ref Range   Opiates NONE DETECTED NONE DETECTED   Cocaine NONE DETECTED NONE DETECTED   Benzodiazepines POSITIVE (A) NONE DETECTED   Amphetamines NONE DETECTED NONE DETECTED   Tetrahydrocannabinol NONE DETECTED NONE DETECTED   Barbiturates NONE DETECTED NONE DETECTED  Brain natriuretic peptide  Result Value Ref Range   B Natriuretic Peptide 27.0 0.0 - 100.0 pg/mL   Laboratory interpretation all normal except +UDS, leukocytosis    EKG  EKG Interpretation  Date/Time:  Sunday February 15 2017 01:24:42  EDT Ventricular Rate:  84 PR Interval:    QRS Duration: 94 QT Interval:  375 QTC Calculation: 444 R Axis:   54 Text Interpretation:  Sinus rhythm Borderline T wave abnormalities Since last tracing rate faster 03 Apr 2016 Confirmed by Diamon Reddinger ([id number]) on 02/15/2017 2:05:27 AM       Radiology No results found.  Procedures Procedures (including critical care time)  Medications Ordered in ED Medications  famotidine (PEPCID) tablet 20 mg (20 mg Oral Given 02/15/17 0516)     Initial Impression / Assessment and Plan / ED Course  I have reviewed the triage vital signs and the nursing notes.  Pertinent labs & imaging results that were available during my care of the patient were reviewed by me and considered in my medical decision making (see chart for details).        02 :05 AM Psych holding orders and TTS consult ordered after reviewing labs.   6 AM TTS consult has been done. After discussion with Dr. Dwyane Dee, psychiatrist they recommend a psychiatrist reevaluate in the morning.  Final Clinical Impressions(s) / ED Diagnoses   Final diagnoses:  Schizoaffective disorder, unspecified type ([place])  Suicidal ideation    Disposition pending  Rolland Porter, MD, Barbette Or, MD 02/15/17 [phone]

## 2017-02-15 NOTE — ED Triage Notes (Signed)
Pt brought in by [employer]. Pt is IVC per mom due to schizophrenia because he was continuously talking to self and acting weird. She stated he was calling and making various threats stating " he hasn't slept in several days, not taking his meds, was going to blow up houses and kill people, and yelling I was banging my head on the wall and my brains came out". She informed [employer] this has happened before when he stopped taking medications.

## 2017-02-15 NOTE — Progress Notes (Signed)
CSW filed patient's examination and recommendation paperwork into IVC logbook.  Baylee Campus, LCSWA [facility]  Clinical Social Worker [phone] 

## 2017-02-15 NOTE — ED Notes (Signed)
Patient counseled for hitting camera on ceiling in room. Patient having some paranoia about cameras tonight. Contracts not to do it anymore. Will continue to redirect as needed. Patient very attention seeking.

## 2017-02-16 DIAGNOSIS — R45851 Suicidal ideations: Secondary | ICD-10-CM | POA: Diagnosis not present

## 2017-03-19 ENCOUNTER — Encounter (HOSPITAL_COMMUNITY): Payer: Self-pay | Admitting: Emergency Medicine

## 2017-03-19 ENCOUNTER — Emergency Department (HOSPITAL_COMMUNITY)
Admission: EM | Admit: 2017-03-19 | Discharge: 2017-03-19 | Disposition: A | Discharge status: Home / Self Care | Payer: Medicare Other | Attending: Emergency Medicine | Admitting: Emergency Medicine

## 2017-03-19 DIAGNOSIS — Y999 Unspecified external cause status: Secondary | ICD-10-CM | POA: Insufficient documentation

## 2017-03-19 DIAGNOSIS — Z79899 Other long term (current) drug therapy: Secondary | ICD-10-CM | POA: Diagnosis not present

## 2017-03-19 DIAGNOSIS — F17210 Nicotine dependence, cigarettes, uncomplicated: Secondary | ICD-10-CM | POA: Diagnosis not present

## 2017-03-19 DIAGNOSIS — I509 Heart failure, unspecified: Secondary | ICD-10-CM | POA: Insufficient documentation

## 2017-03-19 DIAGNOSIS — J45909 Unspecified asthma, uncomplicated: Secondary | ICD-10-CM | POA: Diagnosis not present

## 2017-03-19 DIAGNOSIS — S39012A Strain of muscle, fascia and tendon of lower back, initial encounter: Secondary | ICD-10-CM | POA: Diagnosis not present

## 2017-03-19 DIAGNOSIS — X500XXA Overexertion from strenuous movement or load, initial encounter: Secondary | ICD-10-CM | POA: Diagnosis not present

## 2017-03-19 DIAGNOSIS — M545 Low back pain: Secondary | ICD-10-CM | POA: Diagnosis present

## 2017-03-19 DIAGNOSIS — Y929 Unspecified place or not applicable: Secondary | ICD-10-CM | POA: Insufficient documentation

## 2017-03-19 DIAGNOSIS — J449 Chronic obstructive pulmonary disease, unspecified: Secondary | ICD-10-CM | POA: Diagnosis not present

## 2017-03-19 DIAGNOSIS — Y9389 Activity, other specified: Secondary | ICD-10-CM | POA: Insufficient documentation

## 2017-03-19 DIAGNOSIS — Z7982 Long term (current) use of aspirin: Secondary | ICD-10-CM | POA: Diagnosis not present

## 2017-03-19 DIAGNOSIS — Z880 Allergy status to penicillin: Secondary | ICD-10-CM | POA: Diagnosis not present

## 2017-03-19 MED ORDER — NAPROXEN 500 MG PO TABS: 500.0000 mg | ORAL_TABLET | Freq: Two times a day (BID) | ORAL | 0 refills | Status: DC

## 2017-03-19 MED ORDER — KETOROLAC TROMETHAMINE 30 MG/ML IJ SOLN
30.0000 mg | Freq: Once | INTRAMUSCULAR | Status: AC
  Administered 2017-03-19: 30 mg via INTRAMUSCULAR
  Filled 2017-03-19: qty 1

## 2017-03-19 NOTE — ED Notes (Signed)
Patient left at this time with all belongings. 

## 2017-03-19 NOTE — ED Provider Notes (Signed)
[facility] DEPT Provider Note   CSN: [id number] Arrival date & time: 03/19/17  0229     History   Chief Complaint Chief Complaint  Patient presents with  . Back Pain    HPI [name] is a 46 y.o. male.  HPI  This is a 46 yo male with asthma, CHF, chronic back pain Who presents with back pain. Patient states that he was lifting a heavy TV yesterday and developed lower back pain. He has a history of back pain. He takes tramadol. He took 2 tramadol and 2 Tylenol with minimal relief. Denies radiation of the pain. Denies weakness, numbness, tingling, bowel or bladder difficulty.  He rates his pain at 0 out of 10 when still, 10 out of 10 when moving. Patient states that he does not want pain medication. He states "I'm here for an MRI or something to tell me what is wrong."  Past Medical History:  Diagnosis Date  . Asthma   . CHF (congestive heart failure) ([place])   . Chronic back pain   . COPD (chronic obstructive pulmonary disease) ([place])   . Knee pain, chronic   . Migraines   . Schizophrenia, schizo-affective [facility])     Patient Active Problem List   Diagnosis Date Noted  . Hallucinations 07/29/2014  . Hypokalemia 04/28/2014  . Drug overdose 04/27/2014  . Schizoaffective disorder, bipolar type ([place]) 03/02/2014  . Anxiety state 03/02/2014  . Benzodiazepine dependence ([place]) 03/02/2014  . Chest pain 10/11/2013  . EKG abnormalities 10/11/2013  . Chronic back pain     Past Surgical History:  Procedure Laterality Date  . extraction of wisdom teeth    . KNEE SURGERY     four times       Home Medications    Prior to Admission medications   Medication Sig Start Date End Date Taking? Authorizing Provider  acetaminophen (TYLENOL) 325 MG tablet Take 650 mg by mouth every 6 (six) hours as needed for mild pain, moderate pain or headache.    Yes [provider]  ALPRAZolam [name]) 1 MG tablet Take 1 mg by mouth 4 (four) times daily.  12/30/16  Yes [provider]  aspirin 81 MG chewable tablet Chew 81 mg by mouth daily.   Yes [provider]  doxepin (SINEQUAN) 75 MG capsule Take 150 mg by mouth at bedtime. 02/09/17  Yes [provider]  HYDROcodone-acetaminophen (NORCO) 7.5-325 MG tablet Take 1 tablet by mouth every 6 (six) hours as needed for moderate pain or severe pain.  01/22/17  Yes [provider]  omeprazole (PRILOSEC) 40 MG capsule Take 40 mg by mouth 2 (two) times daily.    Yes [provider]  perphenazine (TRILAFON) 4 MG tablet Take 4-8 mg by mouth 2 (two) times daily. 4 mg every morning and 8 mg at bedtime 01/30/17  Yes [provider]  baclofen (LIORESAL) 10 MG tablet Take 1 tablet (10 mg total) by mouth 3 (three) times daily as needed for muscle spasms. Patient not taking: Reported on 04/05/2015 12/22/14   Gregor Hams, MD  hydrOXYzine (ATARAX/VISTARIL) 25 MG tablet Take 1 tablet (25 mg total) by mouth 3 (three) times daily. Patient not taking: Reported on 04/05/2015 03/06/14   Niel Hummer, NP  naproxen (NAPROSYN) 500 MG tablet Take 1 tablet (500 mg total) by mouth 2 (two) times daily. Limit use to 3-5 days. 03/19/17   Horton, Barbette Hair, MD    Family History No family history on file.  Social History Social History  Substance Use Topics  . Smoking status: Current Every Day Smoker    Packs/day: 3.00    Years: 10.00    Types: Cigarettes    Last attempt to quit: 02/26/2014  . Smokeless tobacco: Never Used  . Alcohol use No     Comment: occ     Allergies   Amoxicillin; Dextromethorphan-guaifenesin; Haloperidol; Meloxicam; Nsaids; Prednisone; Quetiapine; and Ziprasidone   Review of Systems Review of Systems  Genitourinary: Negative for difficulty urinating.  Musculoskeletal: Positive for back pain.  Neurological: Negative for weakness and numbness.  All other systems reviewed and are negative.    Physical Exam Updated Vital Signs BP 119/82   Pulse 73   Temp 98.2 F  (36.8 C) (Oral)   SpO2 98%   Physical Exam  Constitutional: He is oriented to person, place, and time.  Sleeping upon entry to the room, upon arousable, slurred speech and somnolent  HENT:  Head: Normocephalic and atraumatic.  Eyes: Pupils are equal, round, and reactive to light.  Pupils 2 mm reactive bilaterally  Cardiovascular: Normal rate and regular rhythm.   No murmur heard. Pulmonary/Chest: Effort normal and breath sounds normal. No respiratory distress. He has no wheezes.  Abdominal: Soft. There is no tenderness.  Musculoskeletal: He exhibits no edema.  Tenderness palpation bilateral paraspinous muscle regions of the lower lumbar spine, no midline step-off, deformity, or tenderness, negative straight leg raise  Neurological: He is alert and oriented to person, place, and time.  5 out of 5 strength in bilateral lower extremity is, normal reflexes bilaterally, no clonus  Skin: Skin is warm and dry.  Psychiatric: He has a normal mood and affect.  Nursing note and vitals reviewed.    ED Treatments / Results  Labs (all labs ordered are listed, but only abnormal results are displayed) Labs Reviewed - No data to display  EKG  EKG Interpretation None       Radiology No results found.  Procedures Procedures (including critical care time)  Medications Ordered in ED Medications  ketorolac (TORADOL) 30 MG/ML injection 30 mg (30 mg Intramuscular Given 03/19/17 0526)     Initial Impression / Assessment and Plan / ED Course  I have reviewed the triage vital signs and the nursing notes.  Pertinent labs & imaging results that were available during my care of the patient were reviewed by me and considered in my medical decision making (see chart for details).     Patient presents with back pain. Acute on chronic. He is sleeping on initial exam. Very groggy and slurring his words. Reports taking pain medication as well as his antipsychotic medications which have recently  been increased. He states that he does not want medications but wants imaging. He is nonfocal on exam. No frank red flags. No signs or symptoms of cauda equina. I do not feel imaging is indicated at this time. I recommend supportive care with anti-inflammatories, limited secondary to patient's history of GI upset. Patient can continue his tramadol at home. Recommend primary care follow-up for physical therapy. If not improved in 4-6 weeks, then would entertain MRI.  After history, exam, and medical workup I feel the patient has been appropriately medically screened and is safe for discharge home. Pertinent diagnoses were discussed with the patient. Patient was given return precautions.   Final Clinical Impressions(s) / ED Diagnoses   Final diagnoses:  Strain of lumbar region, initial encounter    New Prescriptions Discharge Medication List as of 03/19/2017  5:45 AM    START taking these medications   Details  naproxen (NAPROSYN) 500 MG tablet Take 1 tablet (500 mg total) by mouth 2 (two) times daily. Limit use to 3-5 days., Starting Thu 03/19/2017, Print         Horton, Barbette Hair, MD 03/19/17 [phone]

## 2017-03-19 NOTE — ED Notes (Signed)
RX printed on wrong paper, unable to discharge pt yet.

## 2017-03-19 NOTE — ED Triage Notes (Signed)
Pt reports that he hurt his back today when he was moving a TV today.  He appears groggy but reports that he had taken his night-time psy meds.

## 2017-03-19 NOTE — Discharge Instructions (Signed)
You were seen today for back pain.  This is likely a muscle strain.  Take meds as directed.  Follow-up with PCP for PT referral.  If you develop weakness, numbness, tingling, difficulty with bowel or bladder you need to be re-evaluated.

## 2017-04-02 ENCOUNTER — Encounter (HOSPITAL_COMMUNITY): Payer: Self-pay | Admitting: Nurse Practitioner

## 2017-04-02 ENCOUNTER — Emergency Department (HOSPITAL_COMMUNITY)
Admission: EM | Admit: 2017-04-02 | Discharge: 2017-04-05 | Disposition: A | Discharge status: Other Acute Inpatient Hospital | Payer: Medicare Other | Attending: Emergency Medicine | Admitting: Emergency Medicine

## 2017-04-02 DIAGNOSIS — J45909 Unspecified asthma, uncomplicated: Secondary | ICD-10-CM | POA: Diagnosis not present

## 2017-04-02 DIAGNOSIS — T424X2A Poisoning by benzodiazepines, intentional self-harm, initial encounter: Secondary | ICD-10-CM | POA: Diagnosis present

## 2017-04-02 DIAGNOSIS — T887XXA Unspecified adverse effect of drug or medicament, initial encounter: Secondary | ICD-10-CM | POA: Diagnosis not present

## 2017-04-02 DIAGNOSIS — R45851 Suicidal ideations: Secondary | ICD-10-CM | POA: Diagnosis not present

## 2017-04-02 DIAGNOSIS — R443 Hallucinations, unspecified: Secondary | ICD-10-CM | POA: Diagnosis not present

## 2017-04-02 DIAGNOSIS — Y658 Other specified misadventures during surgical and medical care: Secondary | ICD-10-CM | POA: Insufficient documentation

## 2017-04-02 DIAGNOSIS — J449 Chronic obstructive pulmonary disease, unspecified: Secondary | ICD-10-CM | POA: Diagnosis not present

## 2017-04-02 DIAGNOSIS — I509 Heart failure, unspecified: Secondary | ICD-10-CM | POA: Diagnosis not present

## 2017-04-02 DIAGNOSIS — T50902A Poisoning by unspecified drugs, medicaments and biological substances, intentional self-harm, initial encounter: Secondary | ICD-10-CM

## 2017-04-02 DIAGNOSIS — F250 Schizoaffective disorder, bipolar type: Secondary | ICD-10-CM

## 2017-04-02 LAB — CBC WITH DIFFERENTIAL/PLATELET
BASOS ABS: 0 10*3/uL (ref 0.0–0.1)
BASOS PCT: 0 %
EOS ABS: 0 10*3/uL (ref 0.0–0.7)
EOS PCT: 0 %
HEMATOCRIT: 41.3 % (ref 39.0–52.0)
Hemoglobin: 14.2 g/dL (ref 13.0–17.0)
Lymphocytes Relative: 10 %
Lymphs Abs: 1.2 10*3/uL (ref 0.7–4.0)
MCH: 30.1 pg (ref 26.0–34.0)
MCHC: 34.4 g/dL (ref 30.0–36.0)
MCV: 87.5 fL (ref 78.0–100.0)
MONO ABS: 0.6 10*3/uL (ref 0.1–1.0)
MONOS PCT: 5 %
NEUTROS ABS: 10.2 10*3/uL — AB (ref 1.7–7.7)
Neutrophils Relative %: 85 %
PLATELETS: 199 10*3/uL (ref 150–400)
RBC: 4.72 MIL/uL (ref 4.22–5.81)
RDW: 12.9 % (ref 11.5–15.5)
WBC: 12 10*3/uL — ABNORMAL HIGH (ref 4.0–10.5)

## 2017-04-02 LAB — COMPREHENSIVE METABOLIC PANEL
ALBUMIN: 3.9 g/dL (ref 3.5–5.0)
ALK PHOS: 45 U/L (ref 38–126)
ALT: 35 U/L (ref 17–63)
AST: 41 U/L (ref 15–41)
Anion gap: 9 (ref 5–15)
BILIRUBIN TOTAL: 0.7 mg/dL (ref 0.3–1.2)
BUN: 9 mg/dL (ref 6–20)
CALCIUM: 9 mg/dL (ref 8.9–10.3)
CO2: 24 mmol/L (ref 22–32)
CREATININE: 1.12 mg/dL (ref 0.61–1.24)
Chloride: 108 mmol/L (ref 101–111)
GFR calc Af Amer: >60 mL/min (ref >60)
GFR calc non Af Amer: >60 mL/min (ref >60)
GLUCOSE: 195 mg/dL — AB (ref 65–99)
Potassium: 3.2 mmol/L — ABNORMAL LOW (ref 3.5–5.1)
SODIUM: 141 mmol/L (ref 135–145)
TOTAL PROTEIN: 7 g/dL (ref 6.5–8.1)

## 2017-04-02 LAB — CK
CK TOTAL: 701 U/L — AB (ref 49–397)
Total CK: 850 U/L — ABNORMAL HIGH (ref 49–397)

## 2017-04-02 LAB — SALICYLATE LEVEL: Salicylate Lvl: <7 mg/dL (ref 2.8–30.0)

## 2017-04-02 LAB — MAGNESIUM
Magnesium: 1.9 mg/dL (ref 1.7–2.4)
Magnesium: 2 mg/dL (ref 1.7–2.4)

## 2017-04-02 LAB — ACETAMINOPHEN LEVEL: Acetaminophen (Tylenol), Serum: <10 ug/mL — ABNORMAL LOW (ref 10–30)

## 2017-04-02 LAB — PROTIME-INR
INR: 1.09
Prothrombin Time: 14.1 seconds (ref 11.4–15.2)

## 2017-04-02 MED ORDER — SODIUM CHLORIDE 0.9 % IV BOLUS (SEPSIS)
1000.0000 mL | Freq: Once | INTRAVENOUS | Status: AC
  Administered 2017-04-02: 1000 mL via INTRAVENOUS

## 2017-04-02 MED ORDER — SODIUM CHLORIDE 0.9 % IV BOLUS (SEPSIS)
1000.0000 mL | Freq: Once | INTRAVENOUS | Status: AC
  Administered 2017-04-03: 1000 mL via INTRAVENOUS

## 2017-04-02 MED ORDER — NALOXONE HCL 2 MG/2ML IJ SOSY
1.0000 mg | PREFILLED_SYRINGE | Freq: Once | INTRAMUSCULAR | Status: AC
  Administered 2017-04-02: 1 mg via INTRAVENOUS
  Filled 2017-04-02: qty 2

## 2017-04-02 NOTE — ED Notes (Signed)
POISON CONTROL RECOMMENDATIONS: Per Silverio Lay, Pt has more of a chance of complications related to Perphenazine.Recommends 2L fluid bolus, repeat EKG Q4h x 2, possible temperature disregulation and will need temperature monitor Q2h x 6hrs, continuous cardiac monitoring for 6-8 hours to identify ventricular dysrhythmias. Both medications could cause respiratory depression, pulmonary congestion. Pt is on baclofen and if not taking over 24 hrs could cause withdrawals. If patient awakens and becomes psychotic would recommend restarting baclofen as this may be a sign of withdrawals. Monitor for QRS/QT enlongation and hypo or hypertension. Treat according. Would recommend magnesium and CK for potential rhabdo. Would recheck cmp, mg, and CK again in 4-6 hours.

## 2017-04-02 NOTE — ED Notes (Signed)
Bed: NG87 Expected date:  Expected time:  Means of arrival:  Comments: EMS 48M SI/Overdose

## 2017-04-02 NOTE — ED Notes (Signed)
Patient's father called for update on son, left phone number  [phone]

## 2017-04-02 NOTE — ED Provider Notes (Signed)
[facility] DEPT Provider Note   CSN: [id number] Arrival date & time: 04/02/17  1732     History   Chief Complaint Chief Complaint  Patient presents with  . Drug Overdose  . Suicidal    HPI [name] is a 46 y.o. male.  He presents for evaluation of intentional overdose.  Patient told a friend that he was trying to kill himself, but now he wants to live.  He reportedly took perphenazine, and clonazepam.  Time of ingestion is unknown was reported by the patient to the friend that it was last night.  Currently in the ED the patient is requesting "Narcan."  He has numerous pill bottles, initially, specific medications ingested are not clear.  Level 5 caveat-altered mental status  HPI  Past Medical History:  Diagnosis Date  . Asthma   . CHF (congestive heart failure) ([place])   . Chronic back pain   . COPD (chronic obstructive pulmonary disease) ([place])   . Knee pain, chronic   . Migraines   . Schizophrenia, schizo-affective [facility])     Patient Active Problem List   Diagnosis Date Noted  . Hallucinations 07/29/2014  . Hypokalemia 04/28/2014  . Drug overdose 04/27/2014  . Schizoaffective disorder, bipolar type ([place]) 03/02/2014  . Anxiety state 03/02/2014  . Benzodiazepine dependence ([place]) 03/02/2014  . Chest pain 10/11/2013  . EKG abnormalities 10/11/2013  . Chronic back pain     Past Surgical History:  Procedure Laterality Date  . extraction of wisdom teeth    . KNEE SURGERY     four times       Home Medications    Prior to Admission medications   Medication Sig Start Date End Date Taking? Authorizing Provider  acetaminophen (TYLENOL) 325 MG tablet Take 650 mg by mouth every 6 (six) hours as needed for mild pain, moderate pain or headache.     [provider]  ALPRAZolam [name]) 1 MG tablet Take 1 mg by mouth 4 (four) times daily.  12/30/16   [provider]  aspirin 81 MG chewable tablet Chew 81 mg by mouth daily.    [provider]  baclofen (LIORESAL) 10 MG tablet Take 1 tablet (10 mg total) by mouth 3 (three) times daily as needed for muscle spasms. Patient not taking: Reported on 04/05/2015 12/22/14   Gregor Hams, MD  doxepin (SINEQUAN) 75 MG capsule Take 150 mg by mouth at bedtime. 02/09/17   [provider]  HYDROcodone-acetaminophen (NORCO) 7.5-325 MG tablet Take 1 tablet by mouth every 6 (six) hours as needed for moderate pain or severe pain.  01/22/17   [provider]  hydrOXYzine (ATARAX/VISTARIL) 25 MG tablet Take 1 tablet (25 mg total) by mouth 3 (three) times daily. Patient not taking: Reported on 04/05/2015 03/06/14   Niel Hummer, NP  naproxen (NAPROSYN) 500 MG tablet Take 1 tablet (500 mg total) by mouth 2 (two) times daily. Limit use to 3-5 days. 03/19/17   Horton, Barbette Hair, MD  omeprazole (PRILOSEC) 40 MG capsule Take 40 mg by mouth 2 (two) times daily.     [provider]  perphenazine (TRILAFON) 4 MG tablet Take 4-8 mg by mouth 2 (two) times daily. 4 mg every morning and 8 mg at bedtime 01/30/17   [provider]    Family History History reviewed. No pertinent family history.  Social History Social History  Substance Use Topics  . Smoking status: Current Every Day Smoker    Packs/day: 3.00  Years: 10.00    Types: Cigarettes    Last attempt to quit: 02/26/2014  . Smokeless tobacco: Never Used  . Alcohol use No     Comment: occ     Allergies   Amoxicillin; Dextromethorphan-guaifenesin; Haloperidol; Meloxicam; Nsaids; Prednisone; Quetiapine; and Ziprasidone   Review of Systems Review of Systems  Unable to perform ROS: Mental status change     Physical Exam Updated Vital Signs BP 119/76 (BP Location: Left Arm)   Pulse 75   Temp 99.2 F (37.3 C) (Oral)   Resp 20   Ht 6' (1.829 m)   Wt 108 kg (238 lb)   SpO2 94%   BMI 32.28 kg/m   Physical Exam  Constitutional: He appears well-developed. He appears distressed (He is uncomfortable).  Poor  hygiene  HENT:  Head: Normocephalic and atraumatic.  Right Ear: External ear normal.  Left Ear: External ear normal.  Eyes: Pupils are equal, round, and reactive to light. Conjunctivae and EOM are normal.  Pupils constricted bilaterally  Neck: Normal range of motion and phonation normal. Neck supple.  Cardiovascular: Normal rate, regular rhythm and normal heart sounds.   Pulmonary/Chest: Effort normal and breath sounds normal. No respiratory distress. He has no wheezes. He exhibits no bony tenderness.  Abdominal: Soft. There is no tenderness.  Musculoskeletal: Normal range of motion.  Neurological: He is alert. No cranial nerve deficit or sensory deficit. He exhibits normal muscle tone. Coordination normal.  Dysarthria is present.  No aphasia.  No nystagmus.  Skin: Skin is warm, dry and intact.  Psychiatric:  Lethargic  Nursing note and vitals reviewed.    ED Treatments / Results  Labs (all labs ordered are listed, but only abnormal results are displayed) Labs Reviewed  COMPREHENSIVE METABOLIC PANEL - Abnormal; Notable for the following:       Result Value   Potassium 3.2 (*)    Glucose, Bld 195 (*)    All other components within normal limits  CBC WITH DIFFERENTIAL/PLATELET - Abnormal; Notable for the following:    WBC 12.0 (*)    Neutro Abs 10.2 (*)    All other components within normal limits  ACETAMINOPHEN LEVEL - Abnormal; Notable for the following:    Acetaminophen (Tylenol), Serum <10 (*)    All other components within normal limits  CK - Abnormal; Notable for the following:    Total CK 701 (*)    All other components within normal limits  CK - Abnormal; Notable for the following:    Total CK 850 (*)    All other components within normal limits  PROTIME-INR  SALICYLATE LEVEL  MAGNESIUM  MAGNESIUM  URINALYSIS, ROUTINE W REFLEX MICROSCOPIC  RAPID URINE DRUG SCREEN, HOSP PERFORMED  CK    EKG  EKG Interpretation  Date/Time:  Thursday April 02 2017 21:45:30  EDT Ventricular Rate:  78 PR Interval:    QRS Duration: 107 QT Interval:  399 QTC Calculation: 455 R Axis:   70 Text Interpretation:  Sinus rhythm RSR' in V1 or V2, right VCD or RVH Since last tracing of earlier today QT has shortened Confirmed by Daleen Bo [phone]) on 04/02/2017 10:25:57 PM       Radiology No results found.  Procedures Procedures (including critical care time)  Medications Ordered in ED Medications  sodium chloride 0.9 % bolus 1,000 mL (not administered)  naloxone (NARCAN) injection 1 mg (1 mg Intravenous Given 04/02/17 1750)  sodium chloride 0.9 % bolus 1,000 mL (1,000 mLs Intravenous New Bag/Given 04/02/17 2112)  sodium chloride 0.9 % bolus 1,000 mL (1,000 mLs Intravenous New Bag/Given 04/02/17 2113)     Initial Impression / Assessment and Plan / ED Course  I have reviewed the triage vital signs and the nursing notes.  Pertinent labs & imaging results that were available during my care of the patient were reviewed by me and considered in my medical decision making (see chart for details).  POISON CONTROL RECOMMENDATIONS: Per Silverio Lay, Pt has more of a chance of complications related to Perphenazine.Recommends 2L fluid bolus, repeat EKG Q4h x 2, possible temperature disregulation and will need temperature monitor Q2h x 6hrs, continuous cardiac monitoring for 6-8 hours to identify ventricular dysrhythmias. Both medications could cause respiratory depression, pulmonary congestion. Pt is on baclofen and if not taking over 24 hrs could cause withdrawals. If patient awakens and becomes psychotic would recommend restarting baclofen as this may be a sign of withdrawals. Monitor for QRS/QT enlongation and hypo or hypertension. Treat according. Would recommend magnesium and CK for potential rhabdo. Would recheck cmp, mg, and CK again in 4-6 hours.    Clinical Course as of Apr 03 2355  Thu Apr 02, 2017  1756 Narcan given with no response  [EW]    Clinical Course User  Index [EW] Daleen Bo, MD     Patient Vitals for the past 24 hrs:  BP Temp Temp src Pulse Resp SpO2 Height Weight  04/02/17 2349 - 99.2 F (37.3 C) Oral - - - - -  04/02/17 2348 - 99.2 F (37.3 C) Oral - - - - -  04/02/17 2304 119/76 - - 75 20 94 % - -  04/02/17 2232 116/77 - - 85 (!) 25 90 % - -  04/02/17 2200 116/77 - - 79 20 90 % - -  04/02/17 2159 117/75 - - 79 (!) 21 90 % - -  04/02/17 2126 117/75 - - 76 20 92 % - -  04/02/17 2100 117/75 - - 76 18 92 % - -  04/02/17 2027 109/77 - - 80 19 91 % - -  04/02/17 1946 (!) 91/58 - - 76 (!) 21 96 % - -  04/02/17 1900 (!) 91/58 - - 72 (!) 22 94 % - -  04/02/17 1800 100/61 - - 96 (!) 25 91 % - -  04/02/17 1755 - - - - - 92 % - -  04/02/17 1745 - - - - - - 6' (1.829 m) 108 kg (238 lb)  04/02/17 1744 - - - - - 90 % - -  04/02/17 1742 109/66 98 F (36.7 C) Oral (!) 103 20 (!) 86 % - -  04/02/17 1735 - - - - - 93 % - -    11:32 PM Reevaluation with update and discussion. After initial assessment and treatment, an updated evaluation reveals he is more responsive at this time, and less slurred speech.  He requests something to drink.  Remains on oxygen supplementation. Rayland Hamed L    23: 53-case discussed with poison control, who state that with rising CK he needs additional treatment, observation, until the CK is trending downward.  Third liter of saline ordered.  Patient has not voided yet.    Final Clinical Impressions(s) / ED Diagnoses   Final diagnoses:  Intentional overdose of drug in tablet form ([place])  Suicidal ideation   Suicidal ideation with intentional overdose, at least 2 drugs.  Patient is a poor historian.  Initially altered, gradually improving, with evaluation,  Nursing Notes Reviewed/ Care Coordinated  Applicable Imaging Reviewed Interpretation of Laboratory Data incorporated into ED treatment   Plan-care transferred to Dr. Roxanne Mins to evaluate after more IV fluids and repeat CK testing.  If he can be  medically cleared with decrease trending CK, patient can be seen by psychiatry for further evaluation.   New Prescriptions New Prescriptions   No medications on file     Daleen Bo, MD 04/02/17 2358

## 2017-04-02 NOTE — ED Notes (Signed)
Pt refused to provide urine specimen or to be I&O cath for specimen. Will notify provider.

## 2017-04-02 NOTE — ED Notes (Signed)
Pt is lethargic with slurred speech. Able to respond to commands with stimuli despite giving 1mg  of Narcan. Vitals stable.

## 2017-04-03 DIAGNOSIS — T424X2A Poisoning by benzodiazepines, intentional self-harm, initial encounter: Secondary | ICD-10-CM | POA: Diagnosis not present

## 2017-04-03 LAB — URINALYSIS, ROUTINE W REFLEX MICROSCOPIC
Bilirubin Urine: NEGATIVE
Glucose, UA: NEGATIVE mg/dL
Hgb urine dipstick: NEGATIVE
KETONES UR: NEGATIVE mg/dL
LEUKOCYTES UA: NEGATIVE
NITRITE: NEGATIVE
PH: 6 (ref 5.0–8.0)
Protein, ur: NEGATIVE mg/dL
SPECIFIC GRAVITY, URINE: 1.005 (ref 1.005–1.030)

## 2017-04-03 LAB — CK
Total CK: 860 U/L — ABNORMAL HIGH (ref 49–397)
Total CK: 913 U/L — ABNORMAL HIGH (ref 49–397)
Total CK: 948 U/L — ABNORMAL HIGH (ref 49–397)
Total CK: 694 U/L — ABNORMAL HIGH (ref 49–397)

## 2017-04-03 LAB — RAPID URINE DRUG SCREEN, HOSP PERFORMED
Amphetamines: NOT DETECTED
BARBITURATES: NOT DETECTED
BENZODIAZEPINES: POSITIVE — AB
COCAINE: NOT DETECTED
Opiates: NOT DETECTED
Tetrahydrocannabinol: NOT DETECTED

## 2017-04-03 LAB — BASIC METABOLIC PANEL
ANION GAP: 7 (ref 5–15)
BUN: <5 mg/dL — ABNORMAL LOW (ref 6–20)
CO2: 25 mmol/L (ref 22–32)
Calcium: 8.5 mg/dL — ABNORMAL LOW (ref 8.9–10.3)
Chloride: 112 mmol/L — ABNORMAL HIGH (ref 101–111)
Creatinine, Ser: 0.82 mg/dL (ref 0.61–1.24)
GFR calc Af Amer: >60 mL/min (ref >60)
GFR calc non Af Amer: >60 mL/min (ref >60)
GLUCOSE: 99 mg/dL (ref 65–99)
POTASSIUM: 3.7 mmol/L (ref 3.5–5.1)
Sodium: 144 mmol/L (ref 135–145)

## 2017-04-03 MED ORDER — MAGNESIUM HYDROXIDE 400 MG/5ML PO SUSP
5.0000 mL | Freq: Once | ORAL | Status: AC
  Administered 2017-04-03: 5 mL via ORAL
  Filled 2017-04-03: qty 30

## 2017-04-03 MED ORDER — LACTATED RINGERS IV BOLUS (SEPSIS)
2000.0000 mL | Freq: Once | INTRAVENOUS | Status: AC
  Administered 2017-04-03: 2000 mL via INTRAVENOUS

## 2017-04-03 NOTE — ED Notes (Signed)
Called Poison Control and spoke with Langley Gauss to report recent lab results and EKG results. This case is now closed with Poison Control.

## 2017-04-03 NOTE — ED Notes (Signed)
SBAR Report received from previous nurse. Pt received calm and visible on unit. Pt denies current  HI, A/V H, or pain at this time, and appears otherwise stable and free of distress pt endorses passive SI, anxiety and depression. Pt reminded of camera surveillance, q 15 min rounds, and rules of the milieu. Will continue to assess.

## 2017-04-03 NOTE — ED Notes (Signed)
Per Dr Roxanne Mins, pt will not need IV access at this time. Will obtain lab .

## 2017-04-03 NOTE — ED Notes (Signed)
Pt refuses to give [place] a urine sample.

## 2017-04-03 NOTE — Progress Notes (Signed)
Ronalee Belts from [facility] called and asked if pt still needed placement.  CSW confirmed pt does need placement.  Ronalee Belts asked for SAP[facility] RN's phone number, CSW provided.   CSW awaiting return call from [facility].  Alphonse Guild. Shaquella Stamant, Reed Pandy, CSI Clinical Social Worker Ph: [phone]

## 2017-04-03 NOTE — BH Assessment (Addendum)
Assessment Note  [name] is an 46 y.o. male that presents this date with thoughts of self harm after attempting to overdose on medications. Patient speaks in a low voice and is difficult to understand. Patient is denying S/I to this writer at the time of the assessment stating he "overdosed due to the voices." Patient reports he hears voices that tell him to "kill himself" but states "I don't listen." Per note review, patient told a friend that he was trying to kill himself, but now he wants to live.  Patient reportedly took perphenazine, and clonazepam although the time of ingestion is unknown. On admission patient was in possession of multiple pill bottles and content/amount ingested is unclear. Patient has a history of Schizophrenia and previous suicide attempts with his last admission on 02/15/17 when patient was under a IVC due to S/I.  Patient continues this date, to complain of auditory hallucinations that are command in nature. Patient reports that the voices have increased over the last month but could not identify any current stressors. Patient reports that the voices are telling him to hurt himself although he states that he does have a plan. Patient is oriented to time/place and denies any VH or H/I. Patient reports current medication compliance stating he has a Teacher, music ([facility]) and saw that provider last week for medication management. Patient denies any past history of recent alcohol or recreational drug use. Patient currently denies SI or HI. Patient was cooperative although he was difficult to understand at times due to slurred speech. Patient states he lives alone although has a supportive mother who resides nearby. Per note review patient patient has history of inpatient and outpatient treatment through [facility] for medication management. Patient is voluntary and is requesting a inpatient admission to assist with stabilization and "make the voices stop."  Patient was admitted at  [facility] after an overdose three months ago. Per note review, his mother stated at that time the medications that he was discharged with controlled his Schizophrenia symptoms. Per Waylan Boga, DNP patient meets inpatient criteria as appropriate bed placement is investigated.  Diagnosis: Schizophrenia (per notes)  Past Medical History:  Past Medical History:  Diagnosis Date  . Asthma   . CHF (congestive heart failure) ([place])   . Chronic back pain   . COPD (chronic obstructive pulmonary disease) ([place])   . Knee pain, chronic   . Migraines   . Schizophrenia, schizo-affective ([place])     Past Surgical History:  Procedure Laterality Date  . extraction of wisdom teeth    . KNEE SURGERY     four times    Family History: History reviewed. No pertinent family history.  Social History:  reports that he has been smoking Cigarettes.  He has a 30.00 pack-year smoking history. He has never used smokeless tobacco. He reports that he does not drink alcohol or use drugs.  Additional Social History:  Alcohol / Drug Use Pain Medications: See MAR Prescriptions: See MAR Over the Counter: See MAR History of alcohol / drug use?: No history of alcohol / drug abuse Longest period of sobriety (when/how long):  (Denies) Negative Consequences of Use:  (Denies) Withdrawal Symptoms:  (denies)  CIWA: CIWA-Ar BP: (!) 146/91 (Simultaneous filing. User may not have seen previous data.) Pulse Rate: 90 COWS:    Allergies:  Allergies  Allergen Reactions  . Amoxicillin Diarrhea    Has patient had a PCN reaction causing immediate rash, facial/tongue/throat swelling, SOB or lightheadedness with hypotension: No Has  patient had a PCN reaction causing severe rash involving mucus membranes or skin necrosis: No Has patient had a PCN reaction that required hospitalization: No Has patient had a PCN reaction occurring within the last 10 years: No If all of the above answers are "NO", then may proceed with  Cephalosporin use.   Marland Kitchen Dextromethorphan-Guaifenesin Other (See Comments)    unspecified  . Haloperidol Other (See Comments)    unspecified  . Meloxicam Other (See Comments)    unspecified  . Nsaids Other (See Comments)    Rectal bleeding  . Prednisone Other (See Comments)    unspecified  . Quetiapine Other (See Comments)    Psychosis with high dose (600mg )  . Ziprasidone Other (See Comments)    unspecified    Home Medications:  (Not in a hospital admission)  OB/GYN Status:  No LMP for male patient.  General Assessment Data Location of Assessment: [facility] ED TTS Assessment: In system Is this a Tele or Face-to-Face Assessment?: Face-to-Face Is this an Initial Assessment or a Re-assessment for this encounter?: Initial Assessment Marital status: Widowed [place] name: NA Is patient pregnant?: No Pregnancy Status: No Living Arrangements: Alone Can pt return to current living arrangement?: Yes Admission Status: Voluntary Is patient capable of signing voluntary admission?: Yes Referral Source: Self/Family/Friend Insurance type: [employer] [facility] Medicare  Medical Screening Exam ([place]) Medical Exam completed: Yes  Crisis Care Plan Living Arrangements: Alone Legal Guardian:  (NA) Name of Psychiatrist: [facility] Name of Therapist: None  Education Status Is patient currently in school?: No Current Grade:  (NA) Highest grade of school patient has completed:  (HS diploma) Name of school: NA Contact person: NA  Risk to self with the past 6 months Suicidal Ideation: Yes-Currently Present Has patient been a risk to self within the past 6 months prior to admission? : Yes (Per note review) Suicidal Intent: Yes-Currently Present Has patient had any suicidal intent within the past 6 months prior to admission? : Yes Is patient at risk for suicide?: Yes Suicidal Plan?:  (Pt denies S/I on assessment took pills to stop voices) Has patient had any suicidal plan within the past 6  months prior to admission? : Yes Specify Current Suicidal Plan: pt took overdose to "stop voices" Access to Means: Yes Specify Access to Suicidal Means: pt had medications What has been your use of drugs/alcohol within the last 12 months?: Denies current use Previous Attempts/Gestures: Yes How many times?: 1 Other Self Harm Risks: NA Triggers for Past Attempts: Unknown Intentional Self Injurious Behavior: None Family Suicide History: No Recent stressful life event(s): Other (Comment) (Family issues) Persecutory voices/beliefs?: No Depression: No Depression Symptoms: Feeling worthless/self pity Substance abuse history and/or treatment for substance abuse?: No Suicide prevention information given to non-admitted patients: Not applicable  Risk to Others within the past 6 months Homicidal Ideation: No Does patient have any lifetime risk of violence toward others beyond the six months prior to admission? : No Thoughts of Harm to Others: No Current Homicidal Intent: No Current Homicidal Plan: No Access to Homicidal Means: No Identified Victim: NA History of harm to others?: No Assessment of Violence: None Noted Violent Behavior Description: NA Does patient have access to weapons?: No Criminal Charges Pending?: No Describe Pending Criminal Charges: NA Does patient have a court date: No Court Date:  (NA) Is patient on probation?: No  Psychosis Hallucinations: Auditory Delusions: None noted  Mental Status Report Appearance/Hygiene: Unremarkable Eye Contact: Fair Motor Activity: Freedom of movement Speech: Slow, Slurred Level  of Consciousness: Drowsy Mood: Pleasant Affect: Appropriate to circumstance Anxiety Level: Minimal Thought Processes: Coherent, Relevant Judgement: Partial Orientation: Person, Place, Time Obsessive Compulsive Thoughts/Behaviors: None  Cognitive Functioning Concentration: Decreased Memory: Recent Intact IQ: Average Insight: Fair Impulse Control:  Poor Appetite: Fair Weight Loss: 0 Weight Gain: 0 Sleep: No Change Total Hours of Sleep: 5 Vegetative Symptoms: None  ADLScreening [facility] Assessment Services) Patient's cognitive ability adequate to safely complete daily activities?: Yes Patient able to express need for assistance with ADLs?: Yes Independently performs ADLs?: Yes (appropriate for developmental age)  Prior Inpatient Therapy Prior Inpatient Therapy: Yes Prior Therapy Dates:  (Multiple) Prior Therapy Facilty/Provider(s): [facility] Reason for Treatment: MH issues  Prior Outpatient Therapy Prior Outpatient Therapy: Yes Prior Therapy Dates: 2018 Prior Therapy Facilty/Provider(s): [facility] Reason for Treatment: MH issues Does patient have an ACCT team?: No Does patient have Intensive In-House Services?  : No Does patient have [facility] services? : No Does patient have P4CC services?: No  ADL Screening (condition at time of admission) Patient's cognitive ability adequate to safely complete daily activities?: Yes Is the patient deaf or have difficulty hearing?: No Does the patient have difficulty seeing, even when wearing glasses/contacts?: No Does the patient have difficulty concentrating, remembering, or making decisions?: No Patient able to express need for assistance with ADLs?: Yes Does the patient have difficulty dressing or bathing?: No Independently performs ADLs?: Yes (appropriate for developmental age) Does the patient have difficulty walking or climbing stairs?: No Weakness of Legs: None Weakness of Arms/Hands: None  Home Assistive Devices/Equipment Home Assistive Devices/Equipment: None  Therapy Consults (therapy consults require a physician order) PT Evaluation Needed: No OT Evalulation Needed: No SLP Evaluation Needed: No Abuse/Neglect Assessment (Assessment to be complete while patient is alone) Physical Abuse: Denies Verbal Abuse: Denies Sexual Abuse: Denies (pt states prior history is a "lie"  ) Exploitation of patient/patient's resources: Denies Self-Neglect: Denies Values / Beliefs Cultural Requests During Hospitalization: None Spiritual Requests During Hospitalization: None Consults Spiritual Care Consult Needed: No Social Work Consult Needed: No Regulatory affairs officer (For Healthcare) Does Patient Have a Medical Advance Directive?: No Would patient like information on creating a medical advance directive?: No - Patient declined    Additional Information 1:1 In Past 12 Months?: No CIRT Risk: No Elopement Risk: No Does patient have medical clearance?: Yes     Disposition: Per Waylan Boga, DNP patient meets inpatient criteria as appropriate bed placement is investigated.  Disposition Initial Assessment Completed for this Encounter: Yes Disposition of Patient: Inpatient treatment program Type of inpatient treatment program: Adult  On Site Evaluation by:   Reviewed with Physician:    Mamie Nick 04/03/2017 2:18 PM

## 2017-04-03 NOTE — ED Notes (Signed)
Introduced self to patient/family. Pt oriented to unit expectations.  Assessed pt for:  A) Anxiety &/or agitation: On admission to the [facility] pt's speech is slurred and cannot be assessed at this time because he falls asleep. He asked for his dentures, but dentures did not come with his property. This Probation officer asked the ED staff about his dentures, and they did not have knowledge about them, but they brought with him an empty denture box supplied by the hospital with his name sticker attached. Spoke with his nurse Radene Ou, who had no knowledge about his dentures.   S) Safety: Safety maintained with q-15-minute checks and hourly rounds by staff.  A) ADLs: Pt able to perform ADLs independently.  P) Pick-Up (room cleanliness): Pt's room clean and free of clutter.

## 2017-04-03 NOTE — ED Notes (Addendum)
Sitter came out of room stating patient was removing his IV. When I came into the room, patient had removed his IV. Bleeding controlled. EDP made aware

## 2017-04-03 NOTE — ED Notes (Signed)
Walked in pt's room in order to restart IV and obtain blood sample for CK testing. Pt refused IV , and states wants"different nurse, wants a Physicist, medical". Pt also requesting Milk magnesia . chrarge nurse made aware . MD will be notified.

## 2017-04-03 NOTE — ED Provider Notes (Signed)
Care assumed from Dr. Eulis Foster. Patient had taken overdose of perphenazine and clonazepam. Signed out to me to check CK because poison control indicated patient would not be medically cleared until CK was declining. CK level has been checked twice, and has been rising very slowly. It is not at a level that would warrant hospitalization, but is still continuing to rise. CK will be rechecked. Case is signed out to Dr. [name] Nine.   [name] Fuel, MD 61/84/85 [phone]

## 2017-04-03 NOTE — BH Assessment (Signed)
[facility] Assessment Progress Note  Per Waylan Boga, DNP patient meets inpatient criteria as appropriate bed placement is investigated.

## 2017-04-03 NOTE — ED Notes (Signed)
Spoke to poison control, RN on call, pt is clear to go to [facility] as long as his BUN and Creat are normal. Pt is also alert and oriented x 4.

## 2017-04-03 NOTE — ED Provider Notes (Signed)
Care assumed, awaiting CK on downward trend.   CK with 200 unit drop, medically clear at this time.  TTS eval.  Patient complaining of constipation requesting milk of magnesia.      Deno Etienne, DO 04/03/17 1317

## 2017-04-03 NOTE — BH Assessment (Signed)
[facility] Assessment Progress Note  Per Waylan Boga, DNP, this pt requires psychiatric hospitalization at this time.  The following facilities have been contacted to seek placement for this pt, with results as noted:  Beds available, information sent, decision pending:  [facility]   At capacity:  [facility] [place] [facility] [place], [place] Triage Specialist [phone]

## 2017-04-04 DIAGNOSIS — R45851 Suicidal ideations: Secondary | ICD-10-CM

## 2017-04-04 DIAGNOSIS — T424X2A Poisoning by benzodiazepines, intentional self-harm, initial encounter: Secondary | ICD-10-CM | POA: Diagnosis not present

## 2017-04-04 DIAGNOSIS — R443 Hallucinations, unspecified: Secondary | ICD-10-CM | POA: Diagnosis not present

## 2017-04-04 MED ORDER — ARIPIPRAZOLE 5 MG PO TABS
5.0000 mg | ORAL_TABLET | Freq: Every day | ORAL | Status: DC
  Filled 2017-04-04: qty 1

## 2017-04-04 MED ORDER — CITALOPRAM HYDROBROMIDE 10 MG PO TABS
10.0000 mg | ORAL_TABLET | Freq: Every day | ORAL | Status: DC
  Administered 2017-04-04 – 2017-04-05 (×2): 10 mg via ORAL
  Filled 2017-04-04 (×2): qty 1

## 2017-04-04 MED ORDER — TRAMADOL HCL 50 MG PO TABS
50.0000 mg | ORAL_TABLET | Freq: Two times a day (BID) | ORAL | Status: DC | PRN
  Administered 2017-04-04: 50 mg via ORAL
  Filled 2017-04-04: qty 1

## 2017-04-04 MED ORDER — LORAZEPAM 1 MG PO TABS
1.0000 mg | ORAL_TABLET | Freq: Four times a day (QID) | ORAL | Status: DC | PRN
  Administered 2017-04-04: 1 mg via ORAL
  Filled 2017-04-04: qty 1

## 2017-04-04 MED ORDER — ACETAMINOPHEN 500 MG PO TABS
500.0000 mg | ORAL_TABLET | Freq: Once | ORAL | Status: AC
  Administered 2017-04-04: 500 mg via ORAL
  Filled 2017-04-04: qty 1

## 2017-04-04 MED ORDER — ACETAMINOPHEN 325 MG PO TABS
650.0000 mg | ORAL_TABLET | Freq: Four times a day (QID) | ORAL | Status: DC | PRN
  Administered 2017-04-04 – 2017-04-05 (×4): 650 mg via ORAL
  Filled 2017-04-04 (×4): qty 2

## 2017-04-04 NOTE — Progress Notes (Signed)
CSW contacted by Ellin Mayhew staff member Josph Macho and informed that patient is now on their waitlist. Staff reported that they will call when a bed becomes available.   Abundio Miu, [facility]  Clinical Social Worker [phone]

## 2017-04-04 NOTE — ED Notes (Signed)
IVC papers served by [employer].

## 2017-04-04 NOTE — Progress Notes (Signed)
CSW contacted and confirmed [place] office received IVC paperwork. CSW filed patient's IVC paperwork into IVC logbook.   Abundio Miu, [facility]  Clinical Social Worker [phone]

## 2017-04-04 NOTE — Progress Notes (Signed)
Per Psychiatrist Akintayo and DNP Lord, patient continues to meet inpatient criteria.   CSW contacted [facility] to inquire about patient's referral. Staff reported that she couldn't find patient's referral. CSW refaxed patient's referral to [facility].  CSW contacted [facility], staff reported that their adult unit is at capacity.  CSW contacted [facility], staff reported that their adult unit is at capacity.   Abundio Miu, [facility]  Clinical Social Worker [phone]

## 2017-04-04 NOTE — Progress Notes (Signed)
Rod Holler from [facility] called to to report the pt has been accepted to their [employer]. Bed will be available after 10am on 04/05/17. Accepting is Dr. Mina Marble, MD  Call to report [phone]. Address:  [address] [place].  Nursing staff notified.   Lind Covert, MSW, [place] TTS Specialist [phone]

## 2017-04-04 NOTE — ED Notes (Signed)
EDP called for PT demanding medication for pain. Pt informed MD will be back

## 2017-04-04 NOTE — ED Notes (Signed)
Patient's mother came to visit and he received his bottom dentures.  He was rude and demanding with his mother and told her to "go charge my phone.  I'm in excruciating pain and you need to call and tell them about that too!"

## 2017-04-04 NOTE — ED Provider Notes (Signed)
Patient is asking to be restarted on his pain medications. Per the [facility] controlled substance database, I did see the patient is frequently prescribed tramadol. I will restart him on tramadol.   [name] Johns, MD 04/04/17 2113

## 2017-04-04 NOTE — Consult Note (Signed)
[facility] Psychiatry Consult   Reason for Consult:  Command hallucinations Referring Physician:  EDP Patient Identification: [name] MRN:  [id number] Principal Diagnosis: Schizoaffective disorder, bipolar type [facility]) Diagnosis:   Patient Active Problem List   Diagnosis Date Noted  . Hallucinations [R44.3] 07/29/2014    Priority: High  . Schizoaffective disorder, bipolar type ([place]) [F25.0] 03/02/2014    Priority: High  . Hypokalemia [E87.6] 04/28/2014  . Drug overdose [T50.901A] 04/27/2014  . Anxiety state [F41.1] 03/02/2014  . Benzodiazepine dependence ([place]) [F13.20] 03/02/2014  . Chest pain [R07.9] 10/11/2013  . EKG abnormalities [R94.31] 10/11/2013  . Chronic back pain [M54.9, G89.29]     Total Time spent with patient: 45 minutes  Subjective:   [name] is a 46 y.o. male patient admitted with psychosis.  HPI:  46 yo male who came to the ED after overdosing to kill himself to stop the voices.  Reports he has been taking his medications and sees Dr. Reece Levy outpatient but the voices have increased.  No homicidal ideations but clearly distraught.  Medications started and inpatient hospitialization sought.  Past Psychiatric History: schizoaffective disorder, bipolar type  Risk to Self: Suicidal Ideation: Yes-Currently Present Suicidal Intent: Yes-Currently Present Is patient at risk for suicide?: Yes Suicidal Plan?:  (Pt denies S/I on assessment took pills to stop voices) Specify Current Suicidal Plan: pt took overdose to "stop voices" Access to Means: Yes Specify Access to Suicidal Means: pt had medications What has been your use of drugs/alcohol within the last 12 months?: Denies current use How many times?: 1 Other Self Harm Risks: NA Triggers for Past Attempts: Unknown Intentional Self Injurious Behavior: None Risk to Others: Homicidal Ideation: No Thoughts of Harm to Others: No Current Homicidal Intent: No Current Homicidal Plan: No Access to Homicidal  Means: No Identified Victim: NA History of harm to others?: No Assessment of Violence: None Noted Violent Behavior Description: NA Does patient have access to weapons?: No Criminal Charges Pending?: No Describe Pending Criminal Charges: NA Does patient have a court date: No Court Date:  (NA) Prior Inpatient Therapy: Prior Inpatient Therapy: Yes Prior Therapy Dates:  (Multiple) Prior Therapy Facilty/Provider(s): [facility] Reason for Treatment: MH issues Prior Outpatient Therapy: Prior Outpatient Therapy: Yes Prior Therapy Dates: 2018 Prior Therapy Facilty/Provider(s): [facility] Reason for Treatment: MH issues Does patient have an ACCT team?: No Does patient have Intensive In-House Services?  : No Does patient have [facility] services? : No Does patient have P4CC services?: No  Past Medical History:  Past Medical History:  Diagnosis Date  . Asthma   . CHF (congestive heart failure) ([place])   . Chronic back pain   . COPD (chronic obstructive pulmonary disease) ([place])   . Knee pain, chronic   . Migraines   . Schizophrenia, schizo-affective ([place])     Past Surgical History:  Procedure Laterality Date  . extraction of wisdom teeth    . KNEE SURGERY     four times   Family History: History reviewed. No pertinent family history. Family Psychiatric  History: unknown Social History:  History  Alcohol Use No    Comment: occ     History  Drug Use No    Social History   Social History  . Marital status: Single    Spouse name: N/A  . Number of children: N/A  . Years of education: N/A   Social History Main Topics  . Smoking status: Current Every Day Smoker    Packs/day: 3.00  Years: 10.00    Types: Cigarettes    Last attempt to quit: 02/26/2014  . Smokeless tobacco: Never Used  . Alcohol use No     Comment: occ  . Drug use: No  . Sexual activity: No   Other Topics Concern  . None   Social History Narrative  . None   Additional Social History:    Allergies:    Allergies  Allergen Reactions  . Amoxicillin Diarrhea    Has patient had a PCN reaction causing immediate rash, facial/tongue/throat swelling, SOB or lightheadedness with hypotension: No Has patient had a PCN reaction causing severe rash involving mucus membranes or skin necrosis: No Has patient had a PCN reaction that required hospitalization: No Has patient had a PCN reaction occurring within the last 10 years: No If all of the above answers are "NO", then may proceed with Cephalosporin use.   Marland Kitchen Dextromethorphan-Guaifenesin Other (See Comments)    unspecified  . Haloperidol Other (See Comments)    unspecified  . Meloxicam Other (See Comments)    unspecified  . Nsaids Other (See Comments)    Rectal bleeding  . Prednisone Other (See Comments)    unspecified  . Quetiapine Other (See Comments)    Psychosis with high dose (685m)  . Ziprasidone Other (See Comments)    unspecified    Labs:  Results for orders placed or performed during the hospital encounter of 04/02/17 (from the past 48 hour(s))  Comprehensive metabolic panel     Status: Abnormal   Collection Time: 04/02/17  5:57 PM  Result Value Ref Range   Sodium 141 135 - 145 mmol/L   Potassium 3.2 (L) 3.5 - 5.1 mmol/L   Chloride 108 101 - 111 mmol/L   CO2 24 22 - 32 mmol/L   Glucose, Bld 195 (H) 65 - 99 mg/dL   BUN 9 6 - 20 mg/dL   Creatinine, Ser 1.12 0.61 - 1.24 mg/dL   Calcium 9.0 8.9 - 10.3 mg/dL   Total Protein 7.0 6.5 - 8.1 g/dL   Albumin 3.9 3.5 - 5.0 g/dL   AST 41 15 - 41 U/L   ALT 35 17 - 63 U/L   Alkaline Phosphatase 45 38 - 126 U/L   Total Bilirubin 0.7 0.3 - 1.2 mg/dL   GFR calc non Af Amer >60 >60 mL/min   GFR calc Af Amer >60 >60 mL/min    Comment: (NOTE) The eGFR has been calculated using the CKD EPI equation. This calculation has not been validated in all clinical situations. eGFR's persistently <60 mL/min signify possible Chronic Kidney Disease.    Anion gap 9 5 - 15  CBC with Differential      Status: Abnormal   Collection Time: 04/02/17  5:57 PM  Result Value Ref Range   WBC 12.0 (H) 4.0 - 10.5 K/uL   RBC 4.72 4.22 - 5.81 MIL/uL   Hemoglobin 14.2 13.0 - 17.0 g/dL   HCT 41.3 39.0 - 52.0 %   MCV 87.5 78.0 - 100.0 fL   MCH 30.1 26.0 - 34.0 pg   MCHC 34.4 30.0 - 36.0 g/dL   RDW 12.9 11.5 - 15.5 %   Platelets 199 150 - 400 K/uL   Neutrophils Relative % 85 %   Neutro Abs 10.2 (H) 1.7 - 7.7 K/uL   Lymphocytes Relative 10 %   Lymphs Abs 1.2 0.7 - 4.0 K/uL   Monocytes Relative 5 %   Monocytes Absolute 0.6 0.1 - 1.0 K/uL   Eosinophils  Relative 0 %   Eosinophils Absolute 0.0 0.0 - 0.7 K/uL   Basophils Relative 0 %   Basophils Absolute 0.0 0.0 - 0.1 K/uL  Protime-INR     Status: None   Collection Time: 04/02/17  5:57 PM  Result Value Ref Range   Prothrombin Time 14.1 11.4 - 15.2 seconds   INR 1.09   Magnesium     Status: None   Collection Time: 04/02/17  5:57 PM  Result Value Ref Range   Magnesium 1.9 1.7 - 2.4 mg/dL  CK     Status: Abnormal   Collection Time: 04/02/17  5:57 PM  Result Value Ref Range   Total CK 701 (H) 49 - 272 U/L  Salicylate level     Status: None   Collection Time: 04/02/17  5:58 PM  Result Value Ref Range   Salicylate Lvl <5.3 2.8 - 30.0 mg/dL  Acetaminophen level     Status: Abnormal   Collection Time: 04/02/17  5:58 PM  Result Value Ref Range   Acetaminophen (Tylenol), Serum <10 (L) 10 - 30 ug/mL    Comment:        THERAPEUTIC CONCENTRATIONS VARY SIGNIFICANTLY. A RANGE OF 10-30 ug/mL MAY BE AN EFFECTIVE CONCENTRATION FOR MANY PATIENTS. HOWEVER, SOME ARE BEST TREATED AT CONCENTRATIONS OUTSIDE THIS RANGE. ACETAMINOPHEN CONCENTRATIONS >150 ug/mL AT 4 HOURS AFTER INGESTION AND >50 ug/mL AT 12 HOURS AFTER INGESTION ARE OFTEN ASSOCIATED WITH TOXIC REACTIONS.   Magnesium     Status: None   Collection Time: 04/02/17 10:18 PM  Result Value Ref Range   Magnesium 2.0 1.7 - 2.4 mg/dL  CK     Status: Abnormal   Collection Time: 04/02/17 10:18  PM  Result Value Ref Range   Total CK 850 (H) 49 - 397 U/L  Urinalysis, Routine w reflex microscopic     Status: None   Collection Time: 04/03/17 12:12 AM  Result Value Ref Range   Color, Urine YELLOW YELLOW   APPearance CLEAR CLEAR   Specific Gravity, Urine 1.005 1.005 - 1.030   pH 6.0 5.0 - 8.0   Glucose, UA NEGATIVE NEGATIVE mg/dL   Hgb urine dipstick NEGATIVE NEGATIVE   Bilirubin Urine NEGATIVE NEGATIVE   Ketones, ur NEGATIVE NEGATIVE mg/dL   Protein, ur NEGATIVE NEGATIVE mg/dL   Nitrite NEGATIVE NEGATIVE   Leukocytes, UA NEGATIVE NEGATIVE  Urine rapid drug screen (hosp performed)     Status: Abnormal   Collection Time: 04/03/17 12:12 AM  Result Value Ref Range   Opiates NONE DETECTED NONE DETECTED   Cocaine NONE DETECTED NONE DETECTED   Benzodiazepines POSITIVE (A) NONE DETECTED   Amphetamines NONE DETECTED NONE DETECTED   Tetrahydrocannabinol NONE DETECTED NONE DETECTED   Barbiturates NONE DETECTED NONE DETECTED    Comment:        DRUG SCREEN FOR MEDICAL PURPOSES ONLY.  IF CONFIRMATION IS NEEDED FOR ANY PURPOSE, NOTIFY LAB WITHIN 5 DAYS.        LOWEST DETECTABLE LIMITS FOR URINE DRUG SCREEN Drug Class       Cutoff (ng/mL) Amphetamine      1000 Barbiturate      200 Benzodiazepine   664 Tricyclics       403 Opiates          300 Cocaine          300 THC              50   CK     Status: Abnormal   Collection Time:  04/03/17  2:55 AM  Result Value Ref Range   Total CK 860 (H) 49 - 397 U/L  CK     Status: Abnormal   Collection Time: 04/03/17  4:46 AM  Result Value Ref Range   Total CK 913 (H) 49 - 397 U/L  CK     Status: Abnormal   Collection Time: 04/03/17  7:50 AM  Result Value Ref Range   Total CK 948 (H) 49 - 397 U/L  CK     Status: Abnormal   Collection Time: 04/03/17 12:28 PM  Result Value Ref Range   Total CK 694 (H) 49 - 397 U/L  Basic metabolic panel     Status: Abnormal   Collection Time: 04/03/17  4:49 PM  Result Value Ref Range   Sodium 144 135  - 145 mmol/L   Potassium 3.7 3.5 - 5.1 mmol/L   Chloride 112 (H) 101 - 111 mmol/L   CO2 25 22 - 32 mmol/L   Glucose, Bld 99 65 - 99 mg/dL   BUN <5 (L) 6 - 20 mg/dL   Creatinine, Ser 0.82 0.61 - 1.24 mg/dL   Calcium 8.5 (L) 8.9 - 10.3 mg/dL   GFR calc non Af Amer >60 >60 mL/min   GFR calc Af Amer >60 >60 mL/min    Comment: (NOTE) The eGFR has been calculated using the CKD EPI equation. This calculation has not been validated in all clinical situations. eGFR's persistently <60 mL/min signify possible Chronic Kidney Disease.    Anion gap 7 5 - 15    Current Facility-Administered Medications  Medication Dose Route Frequency Provider Last Rate Last Dose  . acetaminophen (TYLENOL) tablet 650 mg  650 mg Oral Q6H PRN Patrecia Pour, NP   650 mg at 04/04/17 1225   Current Outpatient Prescriptions  Medication Sig Dispense Refill  . ALPRAZolam (XANAX) 1 MG tablet Take 1 mg by mouth 4 (four) times daily.     . clonazePAM (KLONOPIN) 1 MG tablet Take 1 mg by mouth 3 (three) times daily as needed for anxiety.  2  . doxepin (SINEQUAN) 75 MG capsule Take 150 mg by mouth at bedtime.  0  . FLUPHENAZINE HCL PO Take 3 tablets by mouth at bedtime.    Marland Kitchen HYDROcodone-acetaminophen (NORCO/VICODIN) 5-325 MG tablet Take 1 tablet by mouth every 4 (four) hours as needed for moderate pain.    . hydrOXYzine (ATARAX/VISTARIL) 50 MG tablet Take 50 mg by mouth at bedtime.    . naproxen (NAPROSYN) 500 MG tablet Take 1 tablet (500 mg total) by mouth 2 (two) times daily. Limit use to 3-5 days. 30 tablet 0  . perphenazine (TRILAFON) 4 MG tablet Take 4-8 mg by mouth 2 (two) times daily. 4 mg every morning and 8 mg at bedtime  0  . traMADol (ULTRAM) 50 MG tablet Take 50 mg by mouth 3 (three) times daily.    . baclofen (LIORESAL) 10 MG tablet Take 1 tablet (10 mg total) by mouth 3 (three) times daily as needed for muscle spasms. (Patient not taking: Reported on 04/05/2015) 60 each 0  . hydrOXYzine (ATARAX/VISTARIL) 25 MG  tablet Take 1 tablet (25 mg total) by mouth 3 (three) times daily. (Patient not taking: Reported on 04/03/2017) 30 tablet 0    Musculoskeletal: Strength & Muscle Tone: within normal limits Gait & Station: normal Patient leans: N/A  Psychiatric Specialty Exam: Physical Exam  Constitutional: He is oriented to person, place, and time. He appears well-developed and well-nourished.  HENT:  Head: Normocephalic.  Neck: Normal range of motion.  Respiratory: Effort normal.  Musculoskeletal: Normal range of motion.  Neurological: He is alert and oriented to person, place, and time.  Psychiatric: His speech is normal. He is actively hallucinating. Cognition and memory are impaired. He expresses impulsivity. He exhibits a depressed mood. He expresses suicidal ideation. He expresses suicidal plans.    Review of Systems  Psychiatric/Behavioral: Positive for depression, hallucinations and suicidal ideas.  All other systems reviewed and are negative.   Blood pressure (!) 140/91, pulse 79, temperature 98.4 F (36.9 C), temperature source Oral, resp. rate 18, height 6' (1.829 m), weight 108 kg (238 lb), SpO2 95 %.Body mass index is 32.28 kg/m.  General Appearance: Disheveled  Eye Contact:  Fair  Speech:  Normal Rate  Volume:  Normal  Mood:  Depressed  Affect:  Blunt  Thought Process:  Coherent and Descriptions of Associations: Intact  Orientation:  Full (Time, Place, and Person)  Thought Content:  Hallucinations: Auditory  Suicidal Thoughts:  Yes.  with intent/plan  Homicidal Thoughts:  No  Memory:  Immediate;   Fair Recent;   Fair Remote;   Fair  Judgement:  Impaired  Insight:  Fair  Psychomotor Activity:  Decreased  Concentration:  Concentration: Fair and Attention Span: Fair  Recall:  [employer] of Knowledge:  Fair  Language:  Good  Akathisia:  No  Handed:  Right  AIMS (if indicated):     Assets:  Housing Leisure Time Physical Health Resilience Social Support  ADL's:  Intact   Cognition:  Impaired,  Mild  Sleep:        Treatment Plan Summary: Daily contact with patient to assess and evaluate symptoms and progress in treatment, Medication management and Plan schizoaffective disorder, bipolar type:  -Crisis stabilization -Medication management:  Discontinued his home medications, started Abilify 5 mg daily for psychosis with the intention to start Abilify maintenna if tolerates, Celexa 10 mg daily for depression -Individual counseling  Disposition: Recommend psychiatric Inpatient admission when medically cleared.  Waylan Boga, NP 04/04/2017 12:28 PM  Patient seen face-to-face for psychiatric evaluation, chart reviewed and case discussed with the physician extender and developed treatment plan. Reviewed the information documented and agree with the treatment plan. Corena Pilgrim, MD

## 2017-04-04 NOTE — ED Notes (Signed)
Patient refused abilify.  He states that he is unable to take it.  He states, "I just can't take ability.  It will mess me up."

## 2017-04-04 NOTE — Progress Notes (Signed)
CSW contacted [facility] to inquire about patient's referral. CSW spoke with staff member Shaunte, who requested patient's labs to verify patient's medical clearance. CSW faxed requested documents and awaiting return call.   Abundio Miu, [facility]  Clinical Social Worker [phone]

## 2017-04-04 NOTE — ED Notes (Signed)
Patient is extremely needy; he has been up to the nurse's station numerous times.  He has requested to see his belongings.  He has been on the phone numerous times and has been loud with the party he is talking to.  Phones were turned off and patient was informed that he has exceeded his phone time.  He has also requested tylenol several times and has been informed it is too early to administer.

## 2017-04-04 NOTE — ED Notes (Signed)
SBAR Report received from previous nurse. Pt received calm and visible on unit. Pt denies current SI/ HI, A/V H, depression, anxiety, or pain at this time, and appears otherwise stable and free of distress. Pt reminded of camera surveillance, q 15 min rounds, and rules of the milieu. Will continue to assess. 

## 2017-04-04 NOTE — ED Notes (Signed)
Patient repeatedly asking "when is the doctor coming round?"  Patient came up to nurse's station and was drooling at his mouth.  His speech is somewhat slurred and difficult to understand.  His scrubs were torn and his appearance extremely disheveled.  He denies any thoughts of self harm today.

## 2017-04-05 DIAGNOSIS — T424X2A Poisoning by benzodiazepines, intentional self-harm, initial encounter: Secondary | ICD-10-CM | POA: Diagnosis not present

## 2017-04-05 NOTE — ED Notes (Signed)
Pt continues to have somatic complaints of pain and other needs not met, other medications he desires, and wanting to go through his wallet for "very important papers." Pt attempting to manipulate staff, medication seek,  and power struggle over his wants. pt needs several prompts to accept redirection. Pt has not slept this shift, will report to oncoming nurse.

## 2017-04-05 NOTE — ED Notes (Signed)
Pt continues to be up at desk repeatedly with multiple requests for basically anything.

## 2017-04-05 NOTE — ED Notes (Signed)
Call sheriff's department for transportation to [facility] after 1000 am.

## 2017-04-05 NOTE — ED Notes (Signed)
Patient is attention and medication seeking.  He comes up to nurse's station every few minutes.  He has to be constantly reminded of when his pain medication is due.  Patient has been asked to go back to his room and rest until breakfast.  He denies any thoughts of self harm.  Patient has been accepted to [facility] to arrive after 1000 am.  Discuss tramadol with NP when she arrives.

## 2017-04-05 NOTE — ED Notes (Signed)
Pt provided medication pt comes to the desk several times an hour asking for food, or water, or medication. Pt continues to attention seek and will not stay in bed for more a few minutes at a time.

## 2017-04-05 NOTE — ED Notes (Signed)
Called report to [facility]. [facility] will be here after 1000.  Patient needs to be processed at main hospital at [address] in [place], [place].

## 2017-04-05 NOTE — ED Notes (Signed)
Discharge note:  Patient transferring to [facility].  Accepted by Dr. Mina Marble.  Report called earlier to [place].  Patient is cooperative; his calm.  His behavior is appropriate.  One bag of belongings was sent with deputy.  Patient was cooperative and in no physical distress.  Vitals wnl and he was ambulatory.  Patient states, "I was just there last month."

## 2017-04-30 ENCOUNTER — Encounter (HOSPITAL_COMMUNITY): Payer: Self-pay | Admitting: Emergency Medicine

## 2017-04-30 ENCOUNTER — Emergency Department (HOSPITAL_COMMUNITY)
Admission: EM | Admit: 2017-04-30 | Discharge: 2017-04-30 | Disposition: A | Discharge status: Home / Self Care | Payer: Medicare Other | Attending: Emergency Medicine | Admitting: Emergency Medicine

## 2017-04-30 DIAGNOSIS — Z5321 Procedure and treatment not carried out due to patient leaving prior to being seen by health care provider: Secondary | ICD-10-CM | POA: Diagnosis not present

## 2017-04-30 DIAGNOSIS — R51 Headache: Secondary | ICD-10-CM | POA: Diagnosis present

## 2017-04-30 NOTE — ED Triage Notes (Signed)
Patient came up to nurse's station to state he would just see his PCP in the morning, he states he no longer has a headache. Ambulatory with steady gait. NAD noted.

## 2017-04-30 NOTE — ED Triage Notes (Signed)
Patient arrives with 1 month of headache. States onset after being discharged from hospital around that time. Endorses photophobia and stiff neck. Patient moves head around freely as he states his neck is stiff and painful. Denies other symptoms aside from feeling hot. Believes he has meningitis or encephalopathy.

## 2017-06-14 ENCOUNTER — Observation Stay (HOSPITAL_COMMUNITY)
Admission: EM | Admit: 2017-06-14 | Discharge: 2017-06-16 | Disposition: A | Discharge status: Home / Self Care | Payer: Medicare Other | Attending: Internal Medicine | Admitting: Internal Medicine

## 2017-06-14 ENCOUNTER — Encounter (HOSPITAL_COMMUNITY): Payer: Self-pay

## 2017-06-14 DIAGNOSIS — G92 Toxic encephalopathy: Secondary | ICD-10-CM | POA: Diagnosis not present

## 2017-06-14 DIAGNOSIS — F17210 Nicotine dependence, cigarettes, uncomplicated: Secondary | ICD-10-CM | POA: Insufficient documentation

## 2017-06-14 DIAGNOSIS — R9431 Abnormal electrocardiogram [ECG] [EKG]: Secondary | ICD-10-CM

## 2017-06-14 DIAGNOSIS — Z79899 Other long term (current) drug therapy: Secondary | ICD-10-CM | POA: Diagnosis not present

## 2017-06-14 DIAGNOSIS — E876 Hypokalemia: Secondary | ICD-10-CM

## 2017-06-14 DIAGNOSIS — T50902A Poisoning by unspecified drugs, medicaments and biological substances, intentional self-harm, initial encounter: Secondary | ICD-10-CM

## 2017-06-14 DIAGNOSIS — F250 Schizoaffective disorder, bipolar type: Principal | ICD-10-CM | POA: Diagnosis present

## 2017-06-14 DIAGNOSIS — T50904A Poisoning by unspecified drugs, medicaments and biological substances, undetermined, initial encounter: Secondary | ICD-10-CM

## 2017-06-14 DIAGNOSIS — J449 Chronic obstructive pulmonary disease, unspecified: Secondary | ICD-10-CM | POA: Insufficient documentation

## 2017-06-14 DIAGNOSIS — T50901A Poisoning by unspecified drugs, medicaments and biological substances, accidental (unintentional), initial encounter: Secondary | ICD-10-CM | POA: Diagnosis present

## 2017-06-14 DIAGNOSIS — I509 Heart failure, unspecified: Secondary | ICD-10-CM | POA: Diagnosis not present

## 2017-06-14 DIAGNOSIS — Z23 Encounter for immunization: Secondary | ICD-10-CM | POA: Diagnosis not present

## 2017-06-14 DIAGNOSIS — Z880 Allergy status to penicillin: Secondary | ICD-10-CM | POA: Diagnosis not present

## 2017-06-14 LAB — COMPREHENSIVE METABOLIC PANEL
ALT: 35 U/L (ref 17–63)
AST: 27 U/L (ref 15–41)
Albumin: 3.7 g/dL (ref 3.5–5.0)
Alkaline Phosphatase: 44 U/L (ref 38–126)
Anion gap: 10 (ref 5–15)
BILIRUBIN TOTAL: 0.5 mg/dL (ref 0.3–1.2)
BUN: 6 mg/dL (ref 6–20)
CO2: 26 mmol/L (ref 22–32)
CREATININE: 0.85 mg/dL (ref 0.61–1.24)
Calcium: 9 mg/dL (ref 8.9–10.3)
Chloride: 105 mmol/L (ref 101–111)
GFR calc Af Amer: >60 mL/min (ref >60)
Glucose, Bld: 136 mg/dL — ABNORMAL HIGH (ref 65–99)
POTASSIUM: 2.8 mmol/L — AB (ref 3.5–5.1)
Sodium: 141 mmol/L (ref 135–145)
TOTAL PROTEIN: 6.8 g/dL (ref 6.5–8.1)

## 2017-06-14 LAB — ACETAMINOPHEN LEVEL: Acetaminophen (Tylenol), Serum: <10 ug/mL — ABNORMAL LOW (ref 10–30)

## 2017-06-14 LAB — SALICYLATE LEVEL: Salicylate Lvl: <7 mg/dL (ref 2.8–30.0)

## 2017-06-14 LAB — CBC
HCT: 42.8 % (ref 39.0–52.0)
Hemoglobin: 14.4 g/dL (ref 13.0–17.0)
MCH: 29.3 pg (ref 26.0–34.0)
MCHC: 33.6 g/dL (ref 30.0–36.0)
MCV: 87 fL (ref 78.0–100.0)
PLATELETS: 180 10*3/uL (ref 150–400)
RBC: 4.92 MIL/uL (ref 4.22–5.81)
RDW: 13.1 % (ref 11.5–15.5)
WBC: 7.6 10*3/uL (ref 4.0–10.5)

## 2017-06-14 LAB — ETHANOL

## 2017-06-14 LAB — CBG MONITORING, ED: Glucose-Capillary: 153 mg/dL — ABNORMAL HIGH (ref 65–99)

## 2017-06-14 LAB — POTASSIUM: POTASSIUM: 3.9 mmol/L (ref 3.5–5.1)

## 2017-06-14 LAB — MAGNESIUM: Magnesium: 2 mg/dL (ref 1.7–2.4)

## 2017-06-14 MED ORDER — POTASSIUM CHLORIDE 10 MEQ/100ML IV SOLN
10.0000 meq | INTRAVENOUS | Status: AC
  Administered 2017-06-14 (×2): 10 meq via INTRAVENOUS
  Filled 2017-06-14 (×2): qty 100

## 2017-06-14 MED ORDER — DEXTROSE-NACL 5-0.45 % IV SOLN
INTRAVENOUS | Status: DC
  Administered 2017-06-14 – 2017-06-15 (×2): via INTRAVENOUS

## 2017-06-14 MED ORDER — POTASSIUM CHLORIDE CRYS ER 20 MEQ PO TBCR
40.0000 meq | EXTENDED_RELEASE_TABLET | Freq: Once | ORAL | Status: DC
  Filled 2017-06-14: qty 2

## 2017-06-14 MED ORDER — LORAZEPAM 2 MG/ML IJ SOLN
1.0000 mg | Freq: Four times a day (QID) | INTRAMUSCULAR | Status: DC | PRN
  Administered 2017-06-15: 1 mg via INTRAVENOUS
  Filled 2017-06-14: qty 1

## 2017-06-14 MED ORDER — ENOXAPARIN SODIUM 40 MG/0.4ML ~~LOC~~ SOLN
40.0000 mg | SUBCUTANEOUS | Status: DC
  Administered 2017-06-14: 40 mg via SUBCUTANEOUS
  Filled 2017-06-14: qty 0.4

## 2017-06-14 MED ORDER — PNEUMOCOCCAL VAC POLYVALENT 25 MCG/0.5ML IJ INJ
0.5000 mL | INJECTION | INTRAMUSCULAR | Status: AC
  Administered 2017-06-15: 0.5 mL via INTRAMUSCULAR
  Filled 2017-06-14: qty 0.5

## 2017-06-14 MED ORDER — SODIUM CHLORIDE 0.9 % IV SOLN
INTRAVENOUS | Status: DC
  Administered 2017-06-14 (×2): via INTRAVENOUS

## 2017-06-14 MED ORDER — TRAMADOL HCL 50 MG PO TABS
50.0000 mg | ORAL_TABLET | Freq: Four times a day (QID) | ORAL | Status: DC | PRN
  Administered 2017-06-15: 50 mg via ORAL
  Filled 2017-06-14: qty 1

## 2017-06-14 MED ORDER — MAGNESIUM SULFATE 50 % IJ SOLN
1.0000 g | Freq: Once | INTRAMUSCULAR | Status: AC
  Administered 2017-06-14: 1 g via INTRAVENOUS
  Filled 2017-06-14: qty 2

## 2017-06-14 NOTE — ED Triage Notes (Signed)
EMS were called by observers who noted pt. To be sitting on the grass beside the road, not far from his vehicle. Upon their arrival paramedics found pt. To be very drowsy and slurring his speech. He tells [place] he "took tranquilizers". He is very sleepy, but arouseable to loud verbal stimuli.

## 2017-06-14 NOTE — ED Provider Notes (Signed)
[facility] DEPT Provider Note   CSN: [id number] Arrival date & time: 06/14/17  0725     History   Chief Complaint Chief Complaint  Patient presents with  . Drug Overdose    HPI Mali A Dingee is a 46 y.o. male.  HPI Patient presents to the emergency room for evaluation of Seroquel overdose. Patient was brought in by EMS. They were called after bystanders noticed that the patient was sitting on the grass on the side of the road not far from his vehicle. When EMS arrived the patient was very drowsy and slurring his speech. He told the paramedics that he took tranquilizers. The patient tells me that he took Seroquel. When I ask him how many pills he took, the patient "states all of them."  He won't quantify. Past Medical History:  Diagnosis Date  . Asthma   . CHF (congestive heart failure) ([place])   . Chronic back pain   . COPD (chronic obstructive pulmonary disease) ([place])   . Knee pain, chronic   . Migraines   . Schizophrenia, schizo-affective [facility])     Patient Active Problem List   Diagnosis Date Noted  . Hallucinations 07/29/2014  . Hypokalemia 04/28/2014  . Drug overdose 04/27/2014  . Schizoaffective disorder, bipolar type ([place]) 03/02/2014  . Anxiety state 03/02/2014  . Benzodiazepine dependence ([place]) 03/02/2014  . Chest pain 10/11/2013  . EKG abnormalities 10/11/2013  . Chronic back pain     Past Surgical History:  Procedure Laterality Date  . extraction of wisdom teeth    . KNEE SURGERY     four times       Home Medications    Prior to Admission medications   Medication Sig Start Date End Date Taking? Authorizing Provider  ALPRAZolam [name]) 1 MG tablet Take 1 mg by mouth 4 (four) times daily.  12/30/16   [provider]  baclofen (LIORESAL) 10 MG tablet Take 1 tablet (10 mg total) by mouth 3 (three) times daily as needed for muscle spasms. Patient not taking: Reported on 04/05/2015 12/22/14   Gregor Hams, MD  clonazePAM (KLONOPIN) 1 MG tablet  Take 1 mg by mouth 3 (three) times daily as needed for anxiety. 03/23/17   [provider]  doxepin (SINEQUAN) 75 MG capsule Take 150 mg by mouth at bedtime. 02/09/17   [provider]  FLUPHENAZINE HCL PO Take 3 tablets by mouth at bedtime.    [provider]  HYDROcodone-acetaminophen (NORCO/VICODIN) 5-325 MG tablet Take 1 tablet by mouth every 4 (four) hours as needed for moderate pain.    [provider]  hydrOXYzine (ATARAX/VISTARIL) 25 MG tablet Take 1 tablet (25 mg total) by mouth 3 (three) times daily. Patient not taking: Reported on 04/03/2017 03/06/14   Niel Hummer, NP  hydrOXYzine (ATARAX/VISTARIL) 50 MG tablet Take 50 mg by mouth at bedtime.    [provider]  naproxen (NAPROSYN) 500 MG tablet Take 1 tablet (500 mg total) by mouth 2 (two) times daily. Limit use to 3-5 days. 03/19/17   Horton, Barbette Hair, MD  perphenazine (TRILAFON) 4 MG tablet Take 4-8 mg by mouth 2 (two) times daily. 4 mg every morning and 8 mg at bedtime 01/30/17   [provider]  traMADol (ULTRAM) 50 MG tablet Take 50 mg by mouth 3 (three) times daily.    [provider]    Family History No family history on file.  Social History Social History  Substance Use Topics  . Smoking status:  Current Every Day Smoker    Packs/day: 3.00    Years: 10.00    Types: Cigarettes    Last attempt to quit: 02/26/2014  . Smokeless tobacco: Never Used  . Alcohol use No     Comment: occ     Allergies   Amoxicillin; Dextromethorphan-guaifenesin; Haloperidol; Meloxicam; Nsaids; Prednisone; Quetiapine; Sudafed [pseudoephedrine hcl]; and Ziprasidone   Review of Systems Review of Systems  All other systems reviewed and are negative.    Physical Exam Updated Vital Signs BP 110/85   Pulse 68   Resp 14   SpO2 96%   Physical Exam  Constitutional: He appears lethargic. He is easily aroused. No distress.  HENT:  Head: Normocephalic and atraumatic.  Right  Ear: External ear normal.  Left Ear: External ear normal.  Eyes: Conjunctivae are normal. Right eye exhibits no discharge. Left eye exhibits no discharge. No scleral icterus.  Neck: Neck supple. No tracheal deviation present.  Cardiovascular: Normal rate, regular rhythm and intact distal pulses.   Pulmonary/Chest: Effort normal and breath sounds normal. No stridor. No respiratory distress. He has no wheezes. He has no rales.  Abdominal: Soft. Bowel sounds are normal. He exhibits no distension. There is no tenderness. There is no rebound and no guarding.  Musculoskeletal: He exhibits no edema or tenderness.  Neurological: He is easily aroused. He has normal strength. He appears lethargic. No cranial nerve deficit (no facial droop, extraocular movements intact, no slurred speech) or sensory deficit. He exhibits normal muscle tone. He displays no seizure activity. Coordination normal.  Patient is sleeping but will wake up to loud voice and sternal rub and answer questions and follow commands, he quickly falls back asleep   Skin: Skin is warm and dry. No rash noted. He is not diaphoretic.  Psychiatric: He has a normal mood and affect.  Nursing note and vitals reviewed.    ED Treatments / Results  Labs (all labs ordered are listed, but only abnormal results are displayed) Labs Reviewed  COMPREHENSIVE METABOLIC PANEL - Abnormal; Notable for the following:       Result Value   Potassium 2.8 (*)    Glucose, Bld 136 (*)    All other components within normal limits  ACETAMINOPHEN LEVEL - Abnormal; Notable for the following:    Acetaminophen (Tylenol), Serum <10 (*)    All other components within normal limits  CBG MONITORING, ED - Abnormal; Notable for the following:    Glucose-Capillary 153 (*)    All other components within normal limits  ETHANOL  SALICYLATE LEVEL  CBC  MAGNESIUM  RAPID URINE DRUG SCREEN, HOSP PERFORMED    EKG  EKG Interpretation  Date/Time:  Sunday June 14 2017  07:35:49 EDT Ventricular Rate:  74 PR Interval:    QRS Duration: 97 QT Interval:  523 QTC Calculation: 581 R Axis:   41 Text Interpretation:  Sinus rhythm RSR' in V1 or V2, right VCD or RVH Prolonged QT interval qt prolonged since last tracing Confirmed by Dorie Rank [phone]) on 06/14/2017 7:44:23 AM       Procedures .Critical Care Performed by: Dorie Rank Authorized by: Dorie Rank   Critical care provider statement:    Critical care time (minutes):  30   Critical care was time spent personally by me on the following activities:  Discussions with consultants, evaluation of patient's response to treatment, examination of patient, ordering and performing treatments and interventions, ordering and review of laboratory studies, ordering and review of radiographic studies, pulse oximetry, re-evaluation  of patient's condition, obtaining history from patient or surrogate and review of old charts    (including critical care time)  Medications Ordered in ED Medications  0.9 %  sodium chloride infusion ( Intravenous New Bag/Given 06/14/17 0818)  potassium chloride SA (K-DUR,KLOR-CON) CR tablet 40 mEq (not administered)  potassium chloride 10 mEq in 100 mL IVPB (not administered)  magnesium sulfate (IV Push/IM) injection 1 g (not administered)     Initial Impression / Assessment and Plan / ED Course  I have reviewed the triage vital signs and the nursing notes.  Pertinent labs & imaging results that were available during my care of the patient were reviewed by me and considered in my medical decision making (see chart for details).  Clinical Course as of Jun 14 1016  Sun Jun 14, 2017  0757 QTC prolongation noted. We'll monitor closely  [JK]    Clinical Course User Index [JK] Dorie Rank, MD    Patient presented to the emergency room after an overdose. Patient states he took Seroquel. Poison center was contacted. They recommend prolonged cardiac monitoring. Patient has hypokalemia and a  prolonged QT interval.  Potassium repletion was ordered. I will also given a dose of magnesium as total body magnesium may be low despite serum level.  Pt has remained stable in the ED.   Still with cns depression. Final Clinical Impressions(s) / ED Diagnoses   Final diagnoses:  Hypokalemia  Intentional drug overdose, initial encounter ([facility])  Prolonged Q-T interval on ECG      Dorie Rank, MD 06/14/17 1513

## 2017-06-14 NOTE — ED Notes (Signed)
Bed: RESB Expected date:  Expected time:  Means of arrival:  Comments: 42 m slurred speech

## 2017-06-14 NOTE — Plan of Care (Signed)
Problem: Safety: Goal: Ability to remain free from injury will improve Outcome: Completed/Met Date Met: 06/14/17 Bed alarm in place.

## 2017-06-14 NOTE — H&P (Signed)
History and Physical  [name] XIH:[id number] DOB: 1971/05/01 DOA: 06/14/2017  PCP:  [name] Sacramento, MD   Chief Complaint:  Drug overdose   History of Present Illness:  Pt is a 46 yo male with hx of Schizophrenia who was brought by EMS for drug overdose. He was hard to arouse but told EMS he took all his bottle of Seroquel. In the ED he was very drowsy and slurring speech and admitted taking "all of his tablets of seroquel" but he was not clear if he was trying to hurt himself. He is still somnolent by the time I saw him in the ED and was not able to provide any history except asking me to stop rubbing his chest.   Review of Systems:  Unable to obtain due to clinical status   Past Medical and Surgical History:   Past Medical History:  Diagnosis Date  . Asthma   . CHF (congestive heart failure) ([place])   . Chronic back pain   . COPD (chronic obstructive pulmonary disease) ([place])   . Knee pain, chronic   . Migraines   . Schizophrenia, schizo-affective ([place])    Past Surgical History:  Procedure Laterality Date  . extraction of wisdom teeth    . KNEE SURGERY     four times    Social History:   reports that he has been smoking Cigarettes.  He has a 30.00 pack-year smoking history. He has never used smokeless tobacco. He reports that he does not drink alcohol or use drugs.    Allergies  Allergen Reactions  . Amoxicillin Diarrhea    Has patient had a PCN reaction causing immediate rash, facial/tongue/throat swelling, SOB or lightheadedness with hypotension: No Has patient had a PCN reaction causing severe rash involving mucus membranes or skin necrosis: No Has patient had a PCN reaction that required hospitalization: No Has patient had a PCN reaction occurring within the last 10 years: No If all of the above answers are "NO", then may proceed with Cephalosporin use.   Marland Kitchen Dextromethorphan-Guaifenesin Other (See Comments)    unspecified  . Haloperidol Other (See  Comments)    unspecified  . Meloxicam Other (See Comments)    unspecified  . Nsaids Other (See Comments)    Rectal bleeding  . Prednisone Other (See Comments)    unspecified  . Quetiapine Other (See Comments)    Psychosis with high dose (600mg )  . Sudafed [Pseudoephedrine Hcl] Other (See Comments)    Hallucinations  . Ziprasidone Other (See Comments)    unspecified    No family history on file.    Prior to Admission medications   Medication Sig Start Date End Date Taking? Authorizing Provider  ALPRAZolam [name]) 1 MG tablet Take 1 mg by mouth 4 (four) times daily.  12/30/16   [provider]  baclofen (LIORESAL) 10 MG tablet Take 1 tablet (10 mg total) by mouth 3 (three) times daily as needed for muscle spasms. Patient not taking: Reported on 04/05/2015 12/22/14   Gregor Hams, MD  clonazePAM (KLONOPIN) 1 MG tablet Take 1 mg by mouth 3 (three) times daily as needed for anxiety. 03/23/17   [provider]  doxepin (SINEQUAN) 75 MG capsule Take 150 mg by mouth at bedtime. 02/09/17   [provider]  FLUPHENAZINE HCL PO Take 3 tablets by mouth at bedtime.    [provider]  HYDROcodone-acetaminophen (NORCO/VICODIN) 5-325 MG tablet Take 1 tablet by mouth every 4 (four) hours as needed for moderate  pain.    [provider]  hydrOXYzine (ATARAX/VISTARIL) 25 MG tablet Take 1 tablet (25 mg total) by mouth 3 (three) times daily. Patient not taking: Reported on 04/03/2017 03/06/14   Niel Hummer, NP  hydrOXYzine (ATARAX/VISTARIL) 50 MG tablet Take 50 mg by mouth at bedtime.    [provider]  naproxen (NAPROSYN) 500 MG tablet Take 1 tablet (500 mg total) by mouth 2 (two) times daily. Limit use to 3-5 days. 03/19/17   Horton, Barbette Hair, MD  perphenazine (TRILAFON) 4 MG tablet Take 4-8 mg by mouth 2 (two) times daily. 4 mg every morning and 8 mg at bedtime 01/30/17   [provider]  traMADol (ULTRAM) 50 MG tablet Take 50 mg by mouth 3  (three) times daily.    [provider]    Physical Exam: BP 98/71   Pulse 65   Resp 14   SpO2 96%   GENERAL :  Responds to chest rubs and moving all extremities . HEAD:           normocephalic. EYES:            PERRL, sluggish and small EARS:           hearing grossly intact. CARDIAC:    Normal S1 and S2. No gallop. No murmurs.  Vascular:     no peripheral edema.  LUNGS:       Clear to auscultation  ABDOMEN: Positive bowel sounds. Soft, nondistended, nontender.     SKIN:            No rash. No lesions. Neuro: moving all extremities, respond to pain stimuli and verbal stimuli. Lethargic/somnolent           Labs on Admission:  Reviewed.   Radiological Exams on Admission: No results found.  EKG:  Independently reviewed. Prolonged QT  Assessment/Plan  Overdose - Seoquel:   likely with current symptoms and signs/PE findings:  Lethargy and sedation (from histamine blockade) .Miosis (from alpha adrenergic blockade) .Anticholinergic toxicity, including confusion (from muscarinic blockade  Will keep on Tele for monitoring of QT Supportive management  Poison control recs to repeat EKG in 2 hours, Tele, benzo prn seizure ( will keep on ativan prn), monitor for hypotension  Replace K, repeat K level in pm.   Will hold other meds including anticholinergic and antimuscarnic meds. Consult psychiatry and keep a sitter till suicidal attempt is ruled out.   Input & Output: NA Lines & Tubes: PIV DVT prophylaxis:  [place] enoxaparin  GI prophylaxis:NA Consultants: Psych Code Status: Full Family Communication: none at bedside  Disposition Plan: TBD    Gennaro Africa M.D [facility]

## 2017-06-14 NOTE — ED Notes (Signed)
Spoke with [name] on poison control.

## 2017-06-14 NOTE — ED Notes (Signed)
I have just spoken with [name] at [facility] who recommends 1: check CMET/ASA/Tylenol/Mg+ 2: Repeat EKG in 2 hours 3: Maintain cardiac monitoring and supportive care x 8 hours 4: Watch for: tachydysrythmias, including torsades; widening qrs (and if this occurs phone for further recommendations); increasing QTc interval 5: watch for hypotension with pressor of choice if needed being Dopamine 6: possible seizures-may give benzos or Phenobarbital if required 7: K+ and Mg++ replacement as needed, guided by labwork. Poison Control # [phone].

## 2017-06-14 NOTE — ED Notes (Signed)
CALL REPORT [name] @[phone]  [phone]

## 2017-06-14 NOTE — Progress Notes (Signed)
Patient is now awake and request food and drink. Provider contacted.V.O given for regular diet if patient alert.  Pt was given water, tolerated well. Will order food. Pt continues to deny SI/HI. Pt reports OD as an accident. Pt was out of bed 1 assist.

## 2017-06-14 NOTE — Progress Notes (Addendum)
Patient arrived to unit. Pt is drowsy responds to pain.  Speech is unclear, slurred. Pt was able to tell me his name and DOB. Pt denied SI/HI.   Wallet right back pocket, Dentures upper/lower, 1 pack cigarettes/lighter  and keys at bedside. Wallet to be sent down to security

## 2017-06-15 ENCOUNTER — Observation Stay (HOSPITAL_COMMUNITY): Payer: Medicare Other

## 2017-06-15 DIAGNOSIS — R440 Auditory hallucinations: Secondary | ICD-10-CM

## 2017-06-15 DIAGNOSIS — T50902A Poisoning by unspecified drugs, medicaments and biological substances, intentional self-harm, initial encounter: Secondary | ICD-10-CM | POA: Diagnosis not present

## 2017-06-15 DIAGNOSIS — F17210 Nicotine dependence, cigarettes, uncomplicated: Secondary | ICD-10-CM

## 2017-06-15 DIAGNOSIS — T43592A Poisoning by other antipsychotics and neuroleptics, intentional self-harm, initial encounter: Secondary | ICD-10-CM

## 2017-06-15 DIAGNOSIS — T50901S Poisoning by unspecified drugs, medicaments and biological substances, accidental (unintentional), sequela: Secondary | ICD-10-CM | POA: Diagnosis not present

## 2017-06-15 DIAGNOSIS — E876 Hypokalemia: Secondary | ICD-10-CM | POA: Diagnosis not present

## 2017-06-15 DIAGNOSIS — I509 Heart failure, unspecified: Secondary | ICD-10-CM | POA: Diagnosis not present

## 2017-06-15 DIAGNOSIS — J449 Chronic obstructive pulmonary disease, unspecified: Secondary | ICD-10-CM | POA: Diagnosis not present

## 2017-06-15 DIAGNOSIS — T1491XA Suicide attempt, initial encounter: Secondary | ICD-10-CM

## 2017-06-15 DIAGNOSIS — F250 Schizoaffective disorder, bipolar type: Secondary | ICD-10-CM | POA: Diagnosis not present

## 2017-06-15 LAB — RAPID URINE DRUG SCREEN, HOSP PERFORMED
AMPHETAMINES: NOT DETECTED
BENZODIAZEPINES: NOT DETECTED
Barbiturates: NOT DETECTED
Cocaine: NOT DETECTED
OPIATES: NOT DETECTED
TETRAHYDROCANNABINOL: NOT DETECTED

## 2017-06-15 LAB — CBC
HEMATOCRIT: 44.1 % (ref 39.0–52.0)
HEMOGLOBIN: 14.3 g/dL (ref 13.0–17.0)
MCH: 28.7 pg (ref 26.0–34.0)
MCHC: 32.4 g/dL (ref 30.0–36.0)
MCV: 88.4 fL (ref 78.0–100.0)
Platelets: 180 10*3/uL (ref 150–400)
RBC: 4.99 MIL/uL (ref 4.22–5.81)
RDW: 13.4 % (ref 11.5–15.5)
WBC: 8.1 10*3/uL (ref 4.0–10.5)

## 2017-06-15 LAB — COMPREHENSIVE METABOLIC PANEL
ALBUMIN: 3.6 g/dL (ref 3.5–5.0)
ALT: 33 U/L (ref 17–63)
ANION GAP: 8 (ref 5–15)
AST: 24 U/L (ref 15–41)
Alkaline Phosphatase: 43 U/L (ref 38–126)
BILIRUBIN TOTAL: 0.3 mg/dL (ref 0.3–1.2)
BUN: 5 mg/dL — ABNORMAL LOW (ref 6–20)
CO2: 26 mmol/L (ref 22–32)
Calcium: 8.9 mg/dL (ref 8.9–10.3)
Chloride: 109 mmol/L (ref 101–111)
Creatinine, Ser: 0.86 mg/dL (ref 0.61–1.24)
GFR calc Af Amer: >60 mL/min (ref >60)
GFR calc non Af Amer: >60 mL/min (ref >60)
GLUCOSE: 109 mg/dL — AB (ref 65–99)
POTASSIUM: 3.3 mmol/L — AB (ref 3.5–5.1)
SODIUM: 143 mmol/L (ref 135–145)
TOTAL PROTEIN: 6.4 g/dL — AB (ref 6.5–8.1)

## 2017-06-15 LAB — HIV ANTIBODY (ROUTINE TESTING W REFLEX): HIV Screen 4th Generation wRfx: NONREACTIVE

## 2017-06-15 MED ORDER — ALUM & MAG HYDROXIDE-SIMETH 200-200-20 MG/5ML PO SUSP: 30.0000 mL | ORAL | Status: DC | PRN

## 2017-06-15 MED ORDER — MORPHINE SULFATE (PF) 4 MG/ML IV SOLN
2.0000 mg | INTRAVENOUS | Status: DC | PRN
  Administered 2017-06-15 – 2017-06-16 (×4): 2 mg via INTRAVENOUS
  Filled 2017-06-15 (×5): qty 1

## 2017-06-15 MED ORDER — HYDROCODONE-ACETAMINOPHEN 5-325 MG PO TABS
1.0000 | ORAL_TABLET | ORAL | Status: DC | PRN
  Administered 2017-06-15 – 2017-06-16 (×4): 1 via ORAL
  Filled 2017-06-15 (×4): qty 1

## 2017-06-15 MED ORDER — SIMETHICONE 80 MG PO CHEW: 80.0000 mg | CHEWABLE_TABLET | Freq: Four times a day (QID) | ORAL | Status: DC | PRN

## 2017-06-15 MED ORDER — LORAZEPAM 2 MG/ML IJ SOLN
1.0000 mg | INTRAMUSCULAR | Status: DC | PRN
  Administered 2017-06-15 – 2017-06-16 (×4): 1 mg via INTRAVENOUS
  Filled 2017-06-15 (×4): qty 1

## 2017-06-15 MED ORDER — POTASSIUM CHLORIDE IN NACL 40-0.9 MEQ/L-% IV SOLN
INTRAVENOUS | Status: DC
  Administered 2017-06-15 – 2017-06-16 (×2): 75 mL/h via INTRAVENOUS
  Filled 2017-06-15 (×3): qty 1000

## 2017-06-15 NOTE — Progress Notes (Signed)
Pt is more alert now. Pt agitated, yelling out and screaming at staff, shaking the bed and throwing things. Ativan and tramadol given just prior to this outburst. Pt states that he is hearing voices. He hears multiple voices, but two specifically are "always crying" and telling him "they want him dead". Pt states that he took his Seroquel to "end it for good",  but  when asked if he wanted to harm himself he denied so. States "I just wants the voices to stop." MD on call notified of change in Pt behavior. New orders received. Will continue to monitor. Arville Lime

## 2017-06-15 NOTE — Progress Notes (Signed)
Patient belongings from security office lock box sent home with mother, per patient consent. This included wallet and contents, cigarettes and lighter, and keys.

## 2017-06-15 NOTE — Progress Notes (Signed)
[facility] PROGRESS NOTE  [name] UXN:[id number] DOB: February 18, 1971 DOA: 06/14/2017   PCP: Christain Sacramento, MD     Assessment/Plan: Active Problems:   Drug overdose    46 yo male with hx of Schizophrenia who was brought by EMS for drug overdose. He was hard to arouse but told EMS he took all his bottle of Seroquel. Patient was brought in by EMS. They were called after bystanders noticed that the patient was sitting on the grass on the side of the road not far from his vehicle. When EMS arrived the patient was very drowsy and slurring his speech. He told the paramedics that he took tranquilizers. The patient tells me that he took Seroquel. When I ask him how many pills he took, the patient "states all of them."  He won't quantify.  Assessment and plan   Acute toxic/metabolic encephalopathy secondary to drug overdose with Seroquel  Poison control was notified-recommend prolonged cardiac monitoring Patient has prolonged QTC, repleting potassium, magnesium 2.0 Will keep on Tele for monitoring of QT Supportive management  Poison control recs to repeat EKG in 2 hours, Tele, benzo prn seizure ( will keep on ativan prn), monitor for hypotension  check MRI of the brain due to slurred speech UDS negative psychiatry consult  Patient placed on involuntary commitment due to altered mental status, inability to make decisions   Hypokalemia-replete    DVT prophylaxsis Lovenox  Code Status:   Full code    Family Communication: Discussed in detail with the patient, all imaging results, lab results explained to the patient   Disposition Plan:   Continue to evaluate medically, psychiatry consult,      Consultants:   Psychiatry  Procedures:  None  Antibiotics: Anti-infectives    None         HPI/Subjective:  Agitated, patient is abusive, cursing, belligerent  Objective: Vitals:   06/14/17 1428 06/14/17 1644 06/14/17 1949 06/15/17 0500  BP: (!) 96/52  101/65 126/76 118/69  Pulse: 63 60 66 78  Resp: 14 15 15 18   Temp: (!) 97.2 F (36.2 C)  98 F (36.7 C) 98.6 F (37 C)  TempSrc: Axillary  Oral Oral  SpO2: 97% 99% 100% 95%  Weight: 102 kg (224 lb 13.9 oz)     Height: 6\' 1"  (1.854 m)       Intake/Output Summary (Last 24 hours) at 06/15/17 0837 Last data filed at 06/15/17 0740  Gross per 24 hour  Intake             2730 ml  Output              700 ml  Net             2030 ml    Exam:  Examination:  General exam: Agitated and abusive  Respiratory system: Clear to auscultation. Respiratory effort normal. Cardiovascular system: S1 & S2 heard, RRR. No JVD, murmurs, rubs, gallops or clicks. No pedal edema. Gastrointestinal system: Abdomen is nondistended, soft and nontender. No organomegaly or masses felt. Normal bowel sounds heard. Central nervous system: Alert and oriented. No focal neurological deficits. Extremities: Symmetric 5 x 5 power. Skin: No rashes, lesions or ulcers Psychiatry: Impaired judgment.      Data Reviewed: I have personally reviewed following labs and imaging studies  Micro Results No results found for this or any previous visit (from the past 240 hour(s)).  Radiology Reports No results found.   CBC  Recent  Labs Lab 06/14/17 0730 06/15/17 0416  WBC 7.6 8.1  HGB 14.4 14.3  HCT 42.8 44.1  PLT 180 180  MCV 87.0 88.4  MCH 29.3 28.7  MCHC 33.6 32.4  RDW 13.1 13.4    Chemistries   Recent Labs Lab 06/14/17 0725 06/14/17 0730 06/14/17 1631 06/15/17 0416  NA  --  141  --  143  K  --  2.8* 3.9 3.3*  CL  --  105  --  109  CO2  --  26  --  26  GLUCOSE  --  136*  --  109*  BUN  --  6  --  5*  CREATININE  --  0.85  --  0.86  CALCIUM  --  9.0  --  8.9  MG 2.0  --   --   --   AST  --  27  --  24  ALT  --  35  --  33  ALKPHOS  --  44  --  43  BILITOT  --  0.5  --  0.3    ------------------------------------------------------------------------------------------------------------------ estimated creatinine clearance is 133.2 mL/min (by C-G formula based on SCr of 0.86 mg/dL). ------------------------------------------------------------------------------------------------------------------ No results for input(s): HGBA1C in the last 72 hours. ------------------------------------------------------------------------------------------------------------------ No results for input(s): CHOL, HDL, LDLCALC, TRIG, CHOLHDL, LDLDIRECT in the last 72 hours. ------------------------------------------------------------------------------------------------------------------ No results for input(s): TSH, T4TOTAL, T3FREE, THYROIDAB in the last 72 hours.  Invalid input(s): FREET3 ------------------------------------------------------------------------------------------------------------------ No results for input(s): VITAMINB12, FOLATE, FERRITIN, TIBC, IRON, RETICCTPCT in the last 72 hours.  Coagulation profile No results for input(s): INR, PROTIME in the last 168 hours.  No results for input(s): DDIMER in the last 72 hours.  Cardiac Enzymes No results for input(s): CKMB, TROPONINI, MYOGLOBIN in the last 168 hours.  Invalid input(s): CK ------------------------------------------------------------------------------------------------------------------ Invalid input(s): POCBNP   CBG:  Recent Labs Lab 06/14/17 0742  GLUCAP 153*       Studies: No results found.    No results found for: HGBA1C Lab Results  Component Value Date   CREATININE 0.86 06/15/2017       Scheduled Meds: . enoxaparin (LOVENOX) injection  40 mg Subcutaneous Q24H  . pneumococcal 23 valent vaccine  0.5 mL Intramuscular Tomorrow-1000  . potassium chloride  40 mEq Oral Once   Continuous Infusions: . 0.9 % NaCl with KCl 40 mEq / L       LOS: 0 days    Time spent: >30 MINS     Reyne Dumas  [facility] Pager [phone]. If 7PM-7AM, please contact night-coverage at [email or web address], password [facility] 06/15/2017, 8:37 AM  LOS: 0 days

## 2017-06-15 NOTE — Care Management Obs Status (Signed)
[facility] NOTIFICATION   Patient Details  Name: Mali A Walthall MRN: [id number] Date of Birth: December 29, 1970   Medicare Observation Status Notification Given:  Other (see comment) (Pt admitted with Drug Overdose. Pt is confused, Involuntary Commitment papers are on chart. )    Manasvini Whatley, RN 06/15/2017, 9:32 AM

## 2017-06-15 NOTE — Progress Notes (Signed)
Patient is threatening to leave the hospital against medical advice. Patient is hearing voices that want him dead, he has a plan to end his life. Patient is very high suicidal risk, will place an order for involuntary commitment.

## 2017-06-15 NOTE — Consult Note (Signed)
[name] Psychiatry Consult   Reason for Consult:  seroquel overdose - intentional Referring Physician:  Dr. Allyson Sabal Patient Identification: [name] MRN:  [id number] Principal Diagnosis: Schizoaffective disorder, bipolar type [facility]) Diagnosis:   Patient Active Problem List   Diagnosis Date Noted  . Hallucinations [R44.3] 07/29/2014  . Hypokalemia [E87.6] 04/28/2014  . Drug overdose [T50.901A] 04/27/2014  . Schizoaffective disorder, bipolar type ([place]) [F25.0] 03/02/2014  . Anxiety state [F41.1] 03/02/2014  . Benzodiazepine dependence ([place]) [F13.20] 03/02/2014  . Chest pain [R07.9] 10/11/2013  . EKG abnormalities [R94.31] 10/11/2013  . Chronic back pain [M54.9, G89.29]     Total Time spent with patient: 1 hour  Subjective:   [name] Vita is a 46 y.o. male patient admitted with Intention to drug overdose of Seroquel as a suicide attempt.  HPI: [name] is a 46 years old male with the history of chronic schizophrenia admitted to [facility] for status post intentional drug overdose as a suicide attempt. Patient reportedly took  whole Bottle of Seroquel because his auditory hallucinations telling him to kill himself. Patient reportedly driving to go to woods so that nobody really find him when he died. Reportedly patient started feeling dizzy with blurred vision and stop the scar at roadside and some bystanders called the emergency medical services. EMS found him with the slurred speech and drowsiness and patient was sitting on the gross the side of the road not far from his vehicle. Patient reported he has been trying several different typical and atypical antipsychotic this course 46 years old and feeling tired of auditory hallucinations telling him to kill himself and does not contract for safety.   Past Psychiatric History: Schizoaffective disorder. Reportedly patient was treated by Dr. Reece Levy at [facility].  Risk to Self: Is patient at  risk for suicide?: No Risk to Others:   Prior Inpatient Therapy:   Prior Outpatient Therapy:    Past Medical History:  Past Medical History:  Diagnosis Date  . Asthma   . CHF (congestive heart failure) ([place])   . Chronic back pain   . COPD (chronic obstructive pulmonary disease) ([place])   . Knee pain, chronic   . Migraines   . Schizophrenia, schizo-affective ([place])     Past Surgical History:  Procedure Laterality Date  . extraction of wisdom teeth    . KNEE SURGERY     four times   Family History: No family history on file. Family Psychiatric  History: Denied family history of mental illness Social History:  History  Alcohol Use No    Comment: occ     History  Drug Use No    Social History   Social History  . Marital status: Single    Spouse name: N/A  . Number of children: N/A  . Years of education: N/A   Social History Main Topics  . Smoking status: Current Every Day Smoker    Packs/day: 3.00    Years: 10.00    Types: Cigarettes    Last attempt to quit: 02/26/2014  . Smokeless tobacco: Never Used  . Alcohol use No     Comment: occ  . Drug use: No  . Sexual activity: No   Other Topics Concern  . None   Social History Narrative  . None   Additional Social History:    Allergies:   Allergies  Allergen Reactions  . Amoxicillin Diarrhea    Has patient had a PCN reaction causing immediate rash, facial/tongue/throat  swelling, SOB or lightheadedness with hypotension: No Has patient had a PCN reaction causing severe rash involving mucus membranes or skin necrosis: No Has patient had a PCN reaction that required hospitalization: No Has patient had a PCN reaction occurring within the last 10 years: No If all of the above answers are "NO", then may proceed with Cephalosporin use.   Marland Kitchen Dextromethorphan-Guaifenesin Other (See Comments)    unspecified  . Haloperidol Other (See Comments)    unspecified  . Meloxicam Other (See Comments)    unspecified  . Nsaids  Other (See Comments)    Rectal bleeding  . Prednisone Other (See Comments)    unspecified  . Quetiapine Other (See Comments)    Psychosis with high dose (669m)  . Sudafed [Pseudoephedrine Hcl] Other (See Comments)    Hallucinations  . Ziprasidone Other (See Comments)    unspecified    Labs:  Results for orders placed or performed during the hospital encounter of 06/14/17 (from the past 48 hour(s))  Magnesium     Status: None   Collection Time: 06/14/17  7:25 AM  Result Value Ref Range   Magnesium 2.0 1.7 - 2.4 mg/dL  Comprehensive metabolic panel     Status: Abnormal   Collection Time: 06/14/17  7:30 AM  Result Value Ref Range   Sodium 141 135 - 145 mmol/L   Potassium 2.8 (L) 3.5 - 5.1 mmol/L   Chloride 105 101 - 111 mmol/L   CO2 26 22 - 32 mmol/L   Glucose, Bld 136 (H) 65 - 99 mg/dL   BUN 6 6 - 20 mg/dL   Creatinine, Ser 0.85 0.61 - 1.24 mg/dL   Calcium 9.0 8.9 - 10.3 mg/dL   Total Protein 6.8 6.5 - 8.1 g/dL   Albumin 3.7 3.5 - 5.0 g/dL   AST 27 15 - 41 U/L   ALT 35 17 - 63 U/L   Alkaline Phosphatase 44 38 - 126 U/L   Total Bilirubin 0.5 0.3 - 1.2 mg/dL   GFR calc non Af Amer >60 >60 mL/min   GFR calc Af Amer >60 >60 mL/min    Comment: (NOTE) The eGFR has been calculated using the CKD EPI equation. This calculation has not been validated in all clinical situations. eGFR's persistently <60 mL/min signify possible Chronic Kidney Disease.    Anion gap 10 5 - 15  Ethanol     Status: None   Collection Time: 06/14/17  7:30 AM  Result Value Ref Range   Alcohol, Ethyl (B) <10 <10 mg/dL    Comment:        LOWEST DETECTABLE LIMIT FOR SERUM ALCOHOL IS 10 mg/dL FOR MEDICAL PURPOSES ONLY Please note change in reference range.   Salicylate level     Status: None   Collection Time: 06/14/17  7:30 AM  Result Value Ref Range   Salicylate Lvl <<3.82.8 - 30.0 mg/dL  Acetaminophen level     Status: Abnormal   Collection Time: 06/14/17  7:30 AM  Result Value Ref Range    Acetaminophen (Tylenol), Serum <10 (L) 10 - 30 ug/mL    Comment:        THERAPEUTIC CONCENTRATIONS VARY SIGNIFICANTLY. A RANGE OF 10-30 ug/mL MAY BE AN EFFECTIVE CONCENTRATION FOR MANY PATIENTS. HOWEVER, SOME ARE BEST TREATED AT CONCENTRATIONS OUTSIDE THIS RANGE. ACETAMINOPHEN CONCENTRATIONS >150 ug/mL AT 4 HOURS AFTER INGESTION AND >50 ug/mL AT 12 HOURS AFTER INGESTION ARE OFTEN ASSOCIATED WITH TOXIC REACTIONS.   cbc     Status: None  Collection Time: 06/14/17  7:30 AM  Result Value Ref Range   WBC 7.6 4.0 - 10.5 K/uL   RBC 4.92 4.22 - 5.81 MIL/uL   Hemoglobin 14.4 13.0 - 17.0 g/dL   HCT 42.8 39.0 - 52.0 %   MCV 87.0 78.0 - 100.0 fL   MCH 29.3 26.0 - 34.0 pg   MCHC 33.6 30.0 - 36.0 g/dL   RDW 13.1 11.5 - 15.5 %   Platelets 180 150 - 400 K/uL  CBG monitoring, ED     Status: Abnormal   Collection Time: 06/14/17  7:42 AM  Result Value Ref Range   Glucose-Capillary 153 (H) 65 - 99 mg/dL  Potassium     Status: None   Collection Time: 06/14/17  4:31 PM  Result Value Ref Range   Potassium 3.9 3.5 - 5.1 mmol/L    Comment: DELTA CHECK NOTED REPEATED TO VERIFY   Comprehensive metabolic panel     Status: Abnormal   Collection Time: 06/15/17  4:16 AM  Result Value Ref Range   Sodium 143 135 - 145 mmol/L   Potassium 3.3 (L) 3.5 - 5.1 mmol/L   Chloride 109 101 - 111 mmol/L   CO2 26 22 - 32 mmol/L   Glucose, Bld 109 (H) 65 - 99 mg/dL   BUN 5 (L) 6 - 20 mg/dL   Creatinine, Ser 0.86 0.61 - 1.24 mg/dL   Calcium 8.9 8.9 - 10.3 mg/dL   Total Protein 6.4 (L) 6.5 - 8.1 g/dL   Albumin 3.6 3.5 - 5.0 g/dL   AST 24 15 - 41 U/L   ALT 33 17 - 63 U/L   Alkaline Phosphatase 43 38 - 126 U/L   Total Bilirubin 0.3 0.3 - 1.2 mg/dL   GFR calc non Af Amer >60 >60 mL/min   GFR calc Af Amer >60 >60 mL/min    Comment: (NOTE) The eGFR has been calculated using the CKD EPI equation. This calculation has not been validated in all clinical situations. eGFR's persistently <60 mL/min signify  possible Chronic Kidney Disease.    Anion gap 8 5 - 15  CBC     Status: None   Collection Time: 06/15/17  4:16 AM  Result Value Ref Range   WBC 8.1 4.0 - 10.5 K/uL   RBC 4.99 4.22 - 5.81 MIL/uL   Hemoglobin 14.3 13.0 - 17.0 g/dL   HCT 44.1 39.0 - 52.0 %   MCV 88.4 78.0 - 100.0 fL   MCH 28.7 26.0 - 34.0 pg   MCHC 32.4 30.0 - 36.0 g/dL   RDW 13.4 11.5 - 15.5 %   Platelets 180 150 - 400 K/uL  Rapid urine drug screen (hospital performed)     Status: None   Collection Time: 06/15/17  6:25 AM  Result Value Ref Range   Opiates NONE DETECTED NONE DETECTED   Cocaine NONE DETECTED NONE DETECTED   Benzodiazepines NONE DETECTED NONE DETECTED   Amphetamines NONE DETECTED NONE DETECTED   Tetrahydrocannabinol NONE DETECTED NONE DETECTED   Barbiturates NONE DETECTED NONE DETECTED    Comment:        DRUG SCREEN FOR MEDICAL PURPOSES ONLY.  IF CONFIRMATION IS NEEDED FOR ANY PURPOSE, NOTIFY LAB WITHIN 5 DAYS.        LOWEST DETECTABLE LIMITS FOR URINE DRUG SCREEN Drug Class       Cutoff (ng/mL) Amphetamine      1000 Barbiturate      200 Benzodiazepine   585 Tricyclics  300 Opiates          300 Cocaine          300 THC              50     Current Facility-Administered Medications  Medication Dose Route Frequency Provider Last Rate Last Dose  . 0.9 % NaCl with KCl 40 mEq / L  infusion   Intravenous Continuous Reyne Dumas, MD 75 mL/hr at 06/15/17 1002 75 mL/hr at 06/15/17 1002  . alum & mag hydroxide-simeth (MAALOX/MYLANTA) 200-200-20 MG/5ML suspension 30 mL  30 mL Oral Q4H PRN Abrol, Ascencion Dike, MD      . enoxaparin (LOVENOX) injection 40 mg  40 mg Subcutaneous Q24H Gennaro Africa, MD   40 mg at 06/14/17 1800  . HYDROcodone-acetaminophen (NORCO/VICODIN) 5-325 MG per tablet 1 tablet  1 tablet Oral Q4H PRN Arrien, Jimmy Picket, MD   1 tablet at 06/15/17 0358  . LORazepam (ATIVAN) injection 1 mg  1 mg Intravenous Q4H PRN Arrien, Jimmy Picket, MD   1 mg at 06/15/17 0519  . morphine 4  MG/ML injection 2 mg  2 mg Intravenous Q2H PRN Arrien, Jimmy Picket, MD   2 mg at 06/15/17 0520  . potassium chloride SA (K-DUR,KLOR-CON) CR tablet 40 mEq  40 mEq Oral Once Dorie Rank, MD      . traMADol Veatrice Bourbon) tablet 50 mg  50 mg Oral Q6H PRN Arrien, Jimmy Picket, MD   50 mg at 06/15/17 0120    Musculoskeletal: Strength & Muscle Tone: within normal limits Gait & Station: unable to stand Patient leans: N/A  Psychiatric Specialty Exam: Physical Exam as per history and physical   ROS  Patient complaining about some what drowsiness irritability, angry about not able to successfully kill himself and blurred vision. No Fever-chills, No Headache, No changes with Vision or hearing, reports vertigo No problems swallowing food or Liquids, No Chest pain, Cough or Shortness of Breath, No Abdominal pain, No Nausea or Vommitting, Bowel movements are regular, No Blood in stool or Urine, No dysuria, No new skin rashes or bruises, No new joints pains-aches,  No new weakness, tingling, numbness in any extremity, No recent weight gain or loss, No polyuria, polydypsia or polyphagia,  A full 10 point Review of Systems was done, except as stated above, all other Review of Systems were negative.  Blood pressure 118/69, pulse 78, temperature 98.6 F (37 C), temperature source Oral, resp. rate 18, height 6' 1"  (1.854 m), weight 102 kg (224 lb 13.9 oz), SpO2 95 %.Body mass index is 29.67 kg/m.  General Appearance: Bizarre, Disheveled and Guarded  Eye Contact:  Good  Speech:  Pressured  Volume:  Increased  Mood:  Angry, Hopeless, Irritable and Worthless  Affect:  Non-Congruent, Inappropriate and Labile  Thought Process:  Disorganized and Irrelevant  Orientation:  Full (Time, Place, and Person)  Thought Content:  Illogical, Delusions, Hallucinations: Command:  auditory hallucinations. and Paranoid Ideation  Suicidal Thoughts:  Command:  AH telling him to kill himself.  Homicidal Thoughts:  No   Memory:  Immediate;   Good Recent;   Fair Remote;   Fair  Judgement:  Impaired  Insight:  Fair  Psychomotor Activity:  Increased  Concentration:  Concentration: Fair and Attention Span: Fair  Recall:  Good  Fund of Knowledge:  Good  Language:  Good  Akathisia:  Negative  Handed:  Right  AIMS (if indicated):     Assets:  Communication Skills Desire for Improvement Financial Resources/Insurance Housing  Leisure Time Physical Health Resilience Social Support Talents/Skills Transportation  ADL's:  Intact  Cognition:  WNL  Sleep:        Treatment Plan Summary: 46 years old male with a history of schizoaffective disorder bipolar type 28 years came to the hospital status post intentional overdose of Seroquel as a suicide attempt and also reportedly hearing voices commanding him to kill himself.  Recommendation:  Continue safety monitoring and Safety sitter Continue cardiac monitoring for elevated QTC prolongation as recommended by poison control Patient may criteria for acute psychiatric hospitalization when medically stable Referred to the LCSW for appropriate psychiatric placement when medically stable Recommended no psychotropic medication at this time  Appreciate psychiatric consultation and follow up as clinically required Please contact [phone] or [phone] if needs further assistance   Disposition: Recommend psychiatric Inpatient admission when medically cleared. Supportive therapy provided about ongoing stressors.  Ambrose Finland, MD 06/15/2017 11:39 AM

## 2017-06-16 ENCOUNTER — Encounter (HOSPITAL_COMMUNITY): Payer: Self-pay

## 2017-06-16 ENCOUNTER — Inpatient Hospital Stay (HOSPITAL_COMMUNITY)
Admission: AD | Admit: 2017-06-16 | Discharge: 2017-06-26 | DRG: 885 | Disposition: A | Discharge status: Home / Self Care | Payer: Medicare Other | Source: Intra-hospital | Attending: Psychiatry | Admitting: Psychiatry

## 2017-06-16 DIAGNOSIS — T50902A Poisoning by unspecified drugs, medicaments and biological substances, intentional self-harm, initial encounter: Secondary | ICD-10-CM | POA: Diagnosis not present

## 2017-06-16 DIAGNOSIS — F250 Schizoaffective disorder, bipolar type: Secondary | ICD-10-CM | POA: Diagnosis present

## 2017-06-16 DIAGNOSIS — G4700 Insomnia, unspecified: Secondary | ICD-10-CM | POA: Diagnosis present

## 2017-06-16 DIAGNOSIS — J449 Chronic obstructive pulmonary disease, unspecified: Secondary | ICD-10-CM | POA: Diagnosis present

## 2017-06-16 DIAGNOSIS — Z888 Allergy status to other drugs, medicaments and biological substances status: Secondary | ICD-10-CM | POA: Diagnosis not present

## 2017-06-16 DIAGNOSIS — F17210 Nicotine dependence, cigarettes, uncomplicated: Secondary | ICD-10-CM | POA: Diagnosis present

## 2017-06-16 DIAGNOSIS — Z881 Allergy status to other antibiotic agents status: Secondary | ICD-10-CM

## 2017-06-16 DIAGNOSIS — I10 Essential (primary) hypertension: Secondary | ICD-10-CM | POA: Diagnosis present

## 2017-06-16 DIAGNOSIS — M25562 Pain in left knee: Secondary | ICD-10-CM | POA: Diagnosis present

## 2017-06-16 DIAGNOSIS — R4582 Worries: Secondary | ICD-10-CM | POA: Diagnosis not present

## 2017-06-16 DIAGNOSIS — F4010 Social phobia, unspecified: Secondary | ICD-10-CM | POA: Diagnosis not present

## 2017-06-16 DIAGNOSIS — M25561 Pain in right knee: Secondary | ICD-10-CM | POA: Diagnosis present

## 2017-06-16 DIAGNOSIS — T1491XA Suicide attempt, initial encounter: Secondary | ICD-10-CM | POA: Diagnosis not present

## 2017-06-16 DIAGNOSIS — E876 Hypokalemia: Secondary | ICD-10-CM | POA: Diagnosis not present

## 2017-06-16 DIAGNOSIS — S51812A Laceration without foreign body of left forearm, initial encounter: Secondary | ICD-10-CM | POA: Diagnosis not present

## 2017-06-16 DIAGNOSIS — F419 Anxiety disorder, unspecified: Secondary | ICD-10-CM | POA: Diagnosis not present

## 2017-06-16 DIAGNOSIS — F39 Unspecified mood [affective] disorder: Secondary | ICD-10-CM | POA: Diagnosis not present

## 2017-06-16 DIAGNOSIS — X789XXA Intentional self-harm by unspecified sharp object, initial encounter: Secondary | ICD-10-CM | POA: Diagnosis not present

## 2017-06-16 DIAGNOSIS — F1310 Sedative, hypnotic or anxiolytic abuse, uncomplicated: Secondary | ICD-10-CM | POA: Diagnosis not present

## 2017-06-16 DIAGNOSIS — R4587 Impulsiveness: Secondary | ICD-10-CM | POA: Diagnosis not present

## 2017-06-16 DIAGNOSIS — R45851 Suicidal ideations: Secondary | ICD-10-CM | POA: Diagnosis present

## 2017-06-16 DIAGNOSIS — R440 Auditory hallucinations: Secondary | ICD-10-CM | POA: Diagnosis not present

## 2017-06-16 DIAGNOSIS — R450 Nervousness: Secondary | ICD-10-CM | POA: Diagnosis not present

## 2017-06-16 DIAGNOSIS — Z915 Personal history of self-harm: Secondary | ICD-10-CM

## 2017-06-16 DIAGNOSIS — F1010 Alcohol abuse, uncomplicated: Secondary | ICD-10-CM | POA: Diagnosis not present

## 2017-06-16 DIAGNOSIS — M549 Dorsalgia, unspecified: Secondary | ICD-10-CM | POA: Diagnosis present

## 2017-06-16 DIAGNOSIS — F411 Generalized anxiety disorder: Secondary | ICD-10-CM | POA: Diagnosis present

## 2017-06-16 DIAGNOSIS — T43592A Poisoning by other antipsychotics and neuroleptics, intentional self-harm, initial encounter: Secondary | ICD-10-CM | POA: Diagnosis not present

## 2017-06-16 DIAGNOSIS — Z736 Limitation of activities due to disability: Secondary | ICD-10-CM | POA: Diagnosis not present

## 2017-06-16 DIAGNOSIS — G8929 Other chronic pain: Secondary | ICD-10-CM | POA: Diagnosis present

## 2017-06-16 DIAGNOSIS — Z91410 Personal history of adult physical and sexual abuse: Secondary | ICD-10-CM | POA: Diagnosis not present

## 2017-06-16 DIAGNOSIS — F29 Unspecified psychosis not due to a substance or known physiological condition: Secondary | ICD-10-CM | POA: Diagnosis not present

## 2017-06-16 MED ORDER — MAGNESIUM HYDROXIDE 400 MG/5ML PO SUSP
30.0000 mL | Freq: Every day | ORAL | Status: DC | PRN
  Administered 2017-06-17 – 2017-06-25 (×2): 30 mL via ORAL
  Filled 2017-06-16 (×2): qty 30

## 2017-06-16 MED ORDER — ALUM & MAG HYDROXIDE-SIMETH 200-200-20 MG/5ML PO SUSP
30.0000 mL | ORAL | Status: DC | PRN
  Administered 2017-06-17 – 2017-06-22 (×3): 30 mL via ORAL
  Filled 2017-06-16 (×4): qty 30

## 2017-06-16 MED ORDER — ACETAMINOPHEN 325 MG PO TABS
650.0000 mg | ORAL_TABLET | Freq: Four times a day (QID) | ORAL | Status: DC | PRN
  Administered 2017-06-17 – 2017-06-26 (×19): 650 mg via ORAL
  Filled 2017-06-16 (×21): qty 2

## 2017-06-16 MED ORDER — DOXEPIN HCL 100 MG PO CAPS
100.0000 mg | ORAL_CAPSULE | Freq: Every day | ORAL | Status: DC
  Administered 2017-06-16: 100 mg via ORAL
  Filled 2017-06-16 (×2): qty 1
  Filled 2017-06-16: qty 4

## 2017-06-16 MED ORDER — LORAZEPAM 1 MG PO TABS
2.0000 mg | ORAL_TABLET | Freq: Four times a day (QID) | ORAL | Status: DC | PRN
  Administered 2017-06-16: 2 mg via ORAL
  Filled 2017-06-16 (×2): qty 2

## 2017-06-16 MED ORDER — POTASSIUM CHLORIDE CRYS ER 20 MEQ PO TBCR
40.0000 meq | EXTENDED_RELEASE_TABLET | Freq: Once | ORAL | Status: DC
  Filled 2017-06-16: qty 2

## 2017-06-16 MED ORDER — ARIPIPRAZOLE 5 MG PO TABS
5.0000 mg | ORAL_TABLET | Freq: Every day | ORAL | Status: DC
  Filled 2017-06-16 (×2): qty 1

## 2017-06-16 NOTE — Progress Notes (Signed)
Pt to be discharged this afternoon to [facility]. Report to this facility. Pt and Mother aware of bed availability.

## 2017-06-16 NOTE — Progress Notes (Addendum)
LCSW following for disposition of needs: recommendations for inpatient admissions   LCSW spoke with [facility] [name]) who was already aware of patient. Reviewing with possibly admission this afternoon once beds are available.  Will follow up once [name] returns called with plans.  3:17 PM Patient has been accepted to 505 bed 1  Patient can arrive at any time  Patient will transport by [employer]. Report:  [phone]  RN made aware of plan. Patient calling his mother to let her know he is being discharged  [name], MSW Clinical Social Work: System Wide Float Coverage for :  [phone]

## 2017-06-16 NOTE — Progress Notes (Signed)
[name] is a 46 year old male being admitted involuntarily to [facility] from [facility].  He came in to the ED via EMS due to intentional OD on Seroquel because the voices told him to do so.  He reported argument with family as the reason for the OD.   He sees Dr. Reece Levy on outpatient basis and has had recent medication changes that have not been effective.  During [facility] admission, he denies SI/HI and will contract for safety on the unit.  He continues to hear voices on a regular basis and they tell him "bad thing."  His speech was rapid and slurred at times.  He was difficult to understand but would redirect easily.  Oriented him to the unit.  Admission paperwork completed and signed.  Belongings searched and secured in locker # 49.  Skin assessment completed and noted acne on back and upper arms.  Q 15 minute checks initiated for safety.  We will monitor the progress towards his goals.

## 2017-06-16 NOTE — Tx Team (Signed)
Initial Treatment Plan 06/16/2017 8:18 PM [name] Noland AJH:[id number]    PATIENT STRESSORS: Marital or family conflict Medication change or noncompliance   PATIENT STRENGTHS: Capable of independent living Curator fund of knowledge Motivation for treatment/growth   PATIENT IDENTIFIED PROBLEMS: Suicide attempt  Psychosis   "Work on my Armed forces logistics/support/administrative officer"  "Medication adjustment"  "Making better relationships and nurturing the ones that I have"             DISCHARGE CRITERIA:  Improved stabilization in mood, thinking, and/or behavior Motivation to continue treatment in a less acute level of care Verbal commitment to aftercare and medication compliance  PRELIMINARY DISCHARGE PLAN: Outpatient therapy Medication management  PATIENT/FAMILY INVOLVEMENT: This treatment plan has been presented to and reviewed with the patient, [name].  The patient and family have been given the opportunity to ask questions and make suggestions.  Windell Moment, RN 06/16/2017, 8:18 PM

## 2017-06-16 NOTE — Discharge Summary (Signed)
Physician Discharge Summary  [name] MRN: [id number] DOB/AGE: 1970-12-21 46 y.o.  PCP: Christain Sacramento, MD   Admit date: 06/14/2017 Discharge date: 06/16/2017  Discharge Diagnoses:    Principal Problem:   Schizoaffective disorder, bipolar type ([place]) Active Problems:   Drug overdose    Follow-up recommendations Follow-up with PCP in 3-5 days , including all  additional recommended appointments as below Follow-up CBC, CMP in 3-5 days Patient is being discharged to inpatient psychiatric hospital when bed is available      Current Discharge Medication List    CONTINUE these medications which have NOT CHANGED   Details  clonazePAM (KLONOPIN) 1 MG tablet Take 1-2 mg by mouth 2 (two) times daily as needed for anxiety.  Refills: 2    doxepin (SINEQUAN) 50 MG capsule Take 100 mg by mouth at bedtime.    HYDROcodone-acetaminophen (NORCO) 7.5-325 MG tablet Take 1 tablet by mouth 2 (two) times daily as needed for moderate pain.     traMADol (ULTRAM) 50 MG tablet Take 50 mg by mouth 4 (four) times daily.       STOP taking these medications     QUEtiapine (SEROQUEL) 25 MG tablet          Discharge Condition:  Stable from a medical perspective  Discharge Instructions Get Medicines reviewed and adjusted: Please take all your medications with you for your next visit with your Primary MD  Please request your Primary MD to go over all hospital tests and procedure/radiological results at the follow up, please ask your Primary MD to get all Hospital records sent to his/her office.  If you experience worsening of your admission symptoms, develop shortness of breath, life threatening emergency, suicidal or homicidal thoughts you must seek medical attention immediately by calling 911 or calling your MD immediately if symptoms less severe.  You must read complete instructions/literature along with all the possible adverse reactions/side effects for all the Medicines you take and  that have been prescribed to you. Take any new Medicines after you have completely understood and accpet all the possible adverse reactions/side effects.   Do not drive when taking Pain medications.   Do not take more than prescribed Pain, Sleep and Anxiety Medications  Special Instructions: If you have smoked or chewed Tobacco in the last 2 yrs please stop smoking, stop any regular Alcohol and or any Recreational drug use.  Wear Seat belts while driving.  Please note  You were cared for by a hospitalist during your hospital stay. Once you are discharged, your primary care physician will handle any further medical issues. Please note that NO REFILLS for any discharge medications will be authorized once you are discharged, as it is imperative that you return to your primary care physician (or establish a relationship with a primary care physician if you do not have one) for your aftercare needs so that they can reassess your need for medications and monitor your lab values.     Allergies  Allergen Reactions  . Amoxicillin Diarrhea    Has patient had a PCN reaction causing immediate rash, facial/tongue/throat swelling, SOB or lightheadedness with hypotension: No Has patient had a PCN reaction causing severe rash involving mucus membranes or skin necrosis: No Has patient had a PCN reaction that required hospitalization: No Has patient had a PCN reaction occurring within the last 10 years: No If all of the above answers are "NO", then may proceed with Cephalosporin use.   Marland Kitchen Dextromethorphan-Guaifenesin Other (See Comments)  unspecified  . Haloperidol Other (See Comments)    unspecified  . Meloxicam Other (See Comments)    unspecified  . Nsaids Other (See Comments)    Rectal bleeding  . Prednisone Other (See Comments)    unspecified  . Quetiapine Other (See Comments)    Psychosis with high dose (662m)  . Sudafed [Pseudoephedrine Hcl] Other (See Comments)    Hallucinations  .  Ziprasidone Other (See Comments)    unspecified      Disposition: Inpatient psychiatric hospital    Consults  Psychiatry    Significant Diagnostic Studies:  Mr Brain W65Contrast  Result Date: 06/15/2017 CLINICAL DATA:  Altered level of consciousness.  Overdose. EXAM: MRI HEAD WITHOUT CONTRAST TECHNIQUE: Multiplanar, multiecho pulse sequences of the brain and surrounding structures were obtained without intravenous contrast. COMPARISON:  Head CT 04/28/2014 FINDINGS: Brain: There is no evidence of acute infarct, intracranial hemorrhage, mass, midline shift, or extra-axial fluid collection. The ventricles and sulci are normal. There is a chronic lacunar infarct in the right lentiform nucleus, either new or poorly demonstrated on the prior CT. The brain is otherwise normal in signal. Vascular: Major intracranial vascular flow voids are preserved. Skull and upper cervical spine: Unremarkable bone marrow signal. Sinuses/Orbits:  No significant sinus disease.  Unremarkable orbits. Other: None. IMPRESSION: 1. No acute intracranial abnormality. 2. Chronic right basal ganglia lacunar infarct. Electronically Signed   By: ALogan BoresM.D.   On: 06/15/2017 09:47        Filed Weights   06/14/17 1428  Weight: 102 kg (224 lb 13.9 oz)     Microbiology: No results found for this or any previous visit (from the past 240 hour(s)).     Blood Culture    Component Value Date/Time   SDES THROAT 03/26/2014 1400   SPECREQUEST NONE 03/26/2014 1400   CULT  03/26/2014 1400    No Beta Hemolytic Streptococci Isolated [facility] FINAL 03/26/2014 1400      Labs: Results for orders placed or performed during the hospital encounter of 06/14/17 (from the past 48 hour(s))  Potassium     Status: None   Collection Time: 06/14/17  4:31 PM  Result Value Ref Range   Potassium 3.9 3.5 - 5.1 mmol/L    Comment: DELTA CHECK NOTED REPEATED TO VERIFY   HIV  antibody (Routine Testing)     Status: None   Collection Time: 06/15/17  4:16 AM  Result Value Ref Range   HIV Screen 4th Generation wRfx Non Reactive Non Reactive    Comment: (NOTE) [facility] [id number] RompMD PZH:[id number]  Comprehensive metabolic panel     Status: Abnormal   Collection Time: 06/15/17  4:16 AM  Result Value Ref Range   Sodium 143 135 - 145 mmol/L   Potassium 3.3 (L) 3.5 - 5.1 mmol/L   Chloride 109 101 - 111 mmol/L   CO2 26 22 - 32 mmol/L   Glucose, Bld 109 (H) 65 - 99 mg/dL   BUN 5 (L) 6 - 20 mg/dL   Creatinine, Ser 0.86 0.61 - 1.24 mg/dL   Calcium 8.9 8.9 - 10.3 mg/dL   Total Protein 6.4 (L) 6.5 - 8.1 g/dL   Albumin 3.6 3.5 - 5.0 g/dL   AST 24 15 - 41 U/L   ALT 33 17 - 63 U/L   Alkaline Phosphatase 43 38 - 126 U/L   Total Bilirubin  0.3 0.3 - 1.2 mg/dL   GFR calc non Af Amer >60 >60 mL/min   GFR calc Af Amer >60 >60 mL/min    Comment: (NOTE) The eGFR has been calculated using the CKD EPI equation. This calculation has not been validated in all clinical situations. eGFR's persistently <60 mL/min signify possible Chronic Kidney Disease.    Anion gap 8 5 - 15  CBC     Status: None   Collection Time: 06/15/17  4:16 AM  Result Value Ref Range   WBC 8.1 4.0 - 10.5 K/uL   RBC 4.99 4.22 - 5.81 MIL/uL   Hemoglobin 14.3 13.0 - 17.0 g/dL   HCT 44.1 39.0 - 52.0 %   MCV 88.4 78.0 - 100.0 fL   MCH 28.7 26.0 - 34.0 pg   MCHC 32.4 30.0 - 36.0 g/dL   RDW 13.4 11.5 - 15.5 %   Platelets 180 150 - 400 K/uL  Rapid urine drug screen (hospital performed)     Status: None   Collection Time: 06/15/17  6:25 AM  Result Value Ref Range   Opiates NONE DETECTED NONE DETECTED   Cocaine NONE DETECTED NONE DETECTED   Benzodiazepines NONE DETECTED NONE DETECTED   Amphetamines NONE DETECTED NONE DETECTED   Tetrahydrocannabinol NONE DETECTED NONE DETECTED   Barbiturates NONE DETECTED NONE DETECTED     Comment:        DRUG SCREEN FOR MEDICAL PURPOSES ONLY.  IF CONFIRMATION IS NEEDED FOR ANY PURPOSE, NOTIFY LAB WITHIN 5 DAYS.        LOWEST DETECTABLE LIMITS FOR URINE DRUG SCREEN Drug Class       Cutoff (ng/mL) Amphetamine      1000 Barbiturate      200 Benzodiazepine   270 Tricyclics       623 Opiates          300 Cocaine          300 THC              50      Lipid Panel  No results found for: CHOL, TRIG, HDL, CHOLHDL, VLDL, LDLCALC, LDLDIRECT   No results found for: HGBA1C   Lab Results  Component Value Date   CREATININE 0.86 06/15/2017     46 yo male with hx of Schizophrenia who was brought by EMS for drug overdose. He was hard to arouse but told EMS he took all his bottle of Seroquel. Patient was brought in by EMS. They were called after bystanders noticed that the patient was sitting on the grass on the side of the road not far from his vehicle. When EMS arrived the patient was very drowsy and slurring his speech. He told the paramedics that he took tranquilizers. The patient tells me that he took Seroquel. When I ask him how many pills he took,the patient "states all of them." He won't quantify.  Hospital course   Acute toxic/metabolic encephalopathy secondary to drug overdose with Seroquel  Poison control was notified-recommend prolonged cardiac monitoring Initial QTc was 580, patient was admitted and monitored on telemetry Potassium and magnesium were repleted Repeat EKG shows a QTC prolongation has completely resolved, currently around 450 Patient is medically optimized for psychiatric hospitalization UDS negative psychiatry consult recommended inpatient hospitalization Patient placed on involuntary commitment due to altered mental status, inability to make decisions   Hypokalemia-repleted    Discharge Exam:   Blood pressure 134/87, pulse 66, temperature 98.8 F (37.1 C), temperature source Oral, resp. rate 20, height  6' 1"  (1.854 m), weight 102 kg  (224 lb 13.9 oz), SpO2 98 %.   General exam: Agitated and abusive  Respiratory system: Clear to auscultation. Respiratory effort normal. Cardiovascular system: S1 & S2 heard, RRR. No JVD, murmurs, rubs, gallops or clicks. No pedal edema. Gastrointestinal system: Abdomen is nondistended, soft and nontender. No organomegaly or masses felt. Normal bowel sounds heard. Central nervous system: Alert and oriented. No focal neurological deficits.   Follow-up Information    Christain Sacramento, MD. Call.   Specialty:  Family Medicine Why:  Hospital follow-up in 3-5 days Contact information: [address] [place] [address] [phone]           Signed: Reyne Dumas 06/16/2017, 9:19 AM        Time spent >1 hour

## 2017-06-16 NOTE — Progress Notes (Signed)
Adult Psychoeducational Group Note  Date:  06/16/2017 Time:  8:51 PM  Group Topic/Focus:  Wrap-Up Group:   The focus of this group is to help patients review their daily goal of treatment and discuss progress on daily workbooks.  Participation Level:  Active  Participation Quality:  Appropriate  Affect:  Appropriate  Cognitive:  Appropriate  Insight: Appropriate  Engagement in Group:  Engaged  Modes of Intervention:  Discussion  Additional Comments: The patient expressed that he recently arrived to [facility].  Nash Shearer 06/16/2017, 8:51 PM

## 2017-06-17 DIAGNOSIS — R440 Auditory hallucinations: Secondary | ICD-10-CM

## 2017-06-17 DIAGNOSIS — R4582 Worries: Secondary | ICD-10-CM

## 2017-06-17 DIAGNOSIS — T43592A Poisoning by other antipsychotics and neuroleptics, intentional self-harm, initial encounter: Secondary | ICD-10-CM

## 2017-06-17 DIAGNOSIS — F250 Schizoaffective disorder, bipolar type: Principal | ICD-10-CM

## 2017-06-17 DIAGNOSIS — F17210 Nicotine dependence, cigarettes, uncomplicated: Secondary | ICD-10-CM

## 2017-06-17 DIAGNOSIS — T1491XA Suicide attempt, initial encounter: Secondary | ICD-10-CM

## 2017-06-17 DIAGNOSIS — F4010 Social phobia, unspecified: Secondary | ICD-10-CM

## 2017-06-17 DIAGNOSIS — Z736 Limitation of activities due to disability: Secondary | ICD-10-CM

## 2017-06-17 DIAGNOSIS — G4700 Insomnia, unspecified: Secondary | ICD-10-CM

## 2017-06-17 DIAGNOSIS — R4587 Impulsiveness: Secondary | ICD-10-CM

## 2017-06-17 DIAGNOSIS — R450 Nervousness: Secondary | ICD-10-CM

## 2017-06-17 DIAGNOSIS — F419 Anxiety disorder, unspecified: Secondary | ICD-10-CM

## 2017-06-17 MED ORDER — THIAMINE HCL 100 MG/ML IJ SOLN
100.0000 mg | Freq: Once | INTRAMUSCULAR | Status: DC
Start: 2017-06-17 — End: 2017-06-17

## 2017-06-17 MED ORDER — LORAZEPAM 1 MG PO TABS
1.0000 mg | ORAL_TABLET | Freq: Four times a day (QID) | ORAL | Status: DC | PRN
  Administered 2017-06-17 – 2017-06-19 (×5): 1 mg via ORAL
  Filled 2017-06-17 (×5): qty 1

## 2017-06-17 MED ORDER — LOPERAMIDE HCL 2 MG PO CAPS: 2.0000 mg | ORAL_CAPSULE | ORAL | Status: AC | PRN

## 2017-06-17 MED ORDER — ADULT MULTIVITAMIN W/MINERALS CH
1.0000 | ORAL_TABLET | Freq: Every day | ORAL | Status: DC
  Administered 2017-06-18: 1 via ORAL
  Filled 2017-06-17 (×14): qty 1

## 2017-06-17 MED ORDER — LORAZEPAM 1 MG PO TABS: 1.0000 mg | ORAL_TABLET | Freq: Four times a day (QID) | ORAL | Status: DC | PRN

## 2017-06-17 MED ORDER — BENZTROPINE MESYLATE 0.5 MG PO TABS: 0.5000 mg | ORAL_TABLET | Freq: Two times a day (BID) | ORAL | Status: DC | PRN

## 2017-06-17 MED ORDER — HYDROXYZINE HCL 25 MG PO TABS
25.0000 mg | ORAL_TABLET | Freq: Four times a day (QID) | ORAL | Status: AC | PRN
Start: 2017-06-17 — End: 2017-06-20
  Administered 2017-06-18 – 2017-06-20 (×3): 25 mg via ORAL
  Filled 2017-06-17 (×3): qty 1

## 2017-06-17 MED ORDER — VITAMIN B-1 100 MG PO TABS
100.0000 mg | ORAL_TABLET | Freq: Every day | ORAL | Status: DC
  Administered 2017-06-18 – 2017-06-19 (×2): 100 mg via ORAL
  Filled 2017-06-17 (×5): qty 1

## 2017-06-17 MED ORDER — FLUPHENAZINE HCL 2.5 MG PO TABS
2.5000 mg | ORAL_TABLET | Freq: Two times a day (BID) | ORAL | Status: DC
  Administered 2017-06-17 – 2017-06-19 (×4): 2.5 mg via ORAL
  Filled 2017-06-17 (×8): qty 1

## 2017-06-17 MED ORDER — ONDANSETRON 4 MG PO TBDP
4.0000 mg | ORAL_TABLET | Freq: Four times a day (QID) | ORAL | Status: AC | PRN
  Administered 2017-06-17: 4 mg via ORAL
  Filled 2017-06-17: qty 1

## 2017-06-17 NOTE — Progress Notes (Signed)
Patient has been restricted to unit because of inappropriate statement to another patient.  Another patient also heard the statement .

## 2017-06-17 NOTE — [facility] Group Notes (Signed)
LCSW Group Therapy Note  06/17/2017 1:15pm  Type of Therapy/Topic:  Group Therapy:  Balance in Life  Participation Level:  Active  Description of Group:    This group will address the concept of balance and how it feels and looks when one is unbalanced. Patients will be encouraged to process areas in their lives that are out of balance and identify reasons for remaining unbalanced. Facilitators will guide patients in utilizing problem-solving interventions to address and correct the stressor making their life unbalanced. Understanding and applying boundaries will be explored and addressed for obtaining and maintaining a balanced life. Patients will be encouraged to explore ways to assertively make their unbalanced needs known to significant others in their lives, using other group members and facilitator for support and feedback.  Therapeutic Goals: 1. Patient will identify two or more emotions or situations they have that consume much of in their lives. 2. Patient will identify signs/triggers that life has become out of balance:  3. Patient will identify two ways to set boundaries in order to achieve balance in their lives:  4. Patient will demonstrate ability to communicate their needs through discussion and/or role plays  Summary of Patient Progress:   [name] attended group but was in and out of the room.  When he is angry his skins flushes and he begins yelling.  He listens to music when he gets mad and this helps to calm him down.  He states that he has often used deep breathing techniques and has found them to be helpful.     Therapeutic Modalities:   Cognitive Behavioral Therapy Solution-Focused Therapy Assertiveness Training  Darleen Crocker, [place] Work 06/17/2017 1:27 PM

## 2017-06-17 NOTE — H&P (Signed)
Psychiatric Admission Assessment Adult  Patient Identification: [name] MRN:  [id number] Date of Evaluation:  06/17/2017 Chief Complaint:  Schizoaffective Disorder Bipolar type  Principal Diagnosis: Schizoaffective disorder, bipolar type ([place]) Diagnosis:   Patient Active Problem List   Diagnosis Date Noted  . Hallucinations [R44.3] 07/29/2014  . Hypokalemia [E87.6] 04/28/2014  . Drug overdose [T50.901A] 04/27/2014  . Schizoaffective disorder, bipolar type ([place]) [F25.0] 03/02/2014  . Anxiety state [F41.1] 03/02/2014  . Benzodiazepine dependence ([place]) [F13.20] 03/02/2014  . Chest pain [R07.9] 10/11/2013  . EKG abnormalities [R94.31] 10/11/2013  . Chronic back pain [M54.9, G89.29]    History of Present Illness: Per assessment note- [name] is a 46 years old male with the history of chronic schizophrenia admitted to [facility] for status post intentional drug overdose as a suicide attempt. Patient reportedly took  whole Bottle of Seroquel because his auditory hallucinations telling him to kill himself. Patient reportedly driving to go to woods so that nobody really find him when he died. Reportedly patient started feeling dizzy with blurred vision and stop the scar at roadside and some bystanders called the emergency medical services. EMS found him with the slurred speech and drowsiness and patient was sitting on the gross the side of the road not far from his vehicle. Patient reported he has been trying several different typical and atypical antipsychotic this course 46 years old and feeling tired of auditory hallucinations telling him to kill himself and does not contract for safety.   On Evaluation: [name] is awake, alert and oriented. Patient validates the information that was provided in the above assessment. Reports he dosnt feel stable with the medication that he is currently taken. Support, encouragement and reassurance was provided.    Associated  Signs/Symptoms: Depression Symptoms:  depressed mood, feelings of worthlessness/guilt, difficulty concentrating, disturbed sleep, (Hypo) Manic Symptoms:  Distractibility, Hallucinations, Impulsivity, Anxiety Symptoms:  Excessive Worry, Social Anxiety, Psychotic Symptoms:  Hallucinations: Auditory PTSD Symptoms: Avoidance:  Decreased Interest/Participation Total Time spent with patient: 20 minutes  Past Psychiatric History:   Is the patient at risk to self? Yes.    Has the patient been a risk to self in the past 6 months? Yes.    Has the patient been a risk to self within the distant past? Yes.    Is the patient a risk to others? No.  Has the patient been a risk to others in the past 6 months? No.  Has the patient been a risk to others within the distant past? No.   Prior Inpatient Therapy:   Prior Outpatient Therapy:    Alcohol Screening: 1. How often do you have a drink containing alcohol?: Never 9. Have you or someone else been injured as a result of your drinking?: No 10. Has a relative or friend or a doctor or another health worker been concerned about your drinking or suggested you cut down?: No Alcohol Use Disorder Identification Test Final Score (AUDIT): 0 Brief Intervention: AUDIT score less than 7 or less-screening does not suggest unhealthy drinking-brief intervention not indicated Substance Abuse History in the last 12 months:  No. Consequences of Substance Abuse: NA Previous Psychotropic Medications: YES Psychological Evaluations: YES Past Medical History:  Past Medical History:  Diagnosis Date  . Asthma   . CHF (congestive heart failure) ([place])   . Chronic back pain   . COPD (chronic obstructive pulmonary disease) ([place])   . Knee pain, chronic   . Migraines   . Schizophrenia, schizo-affective (  [facility])     Past Surgical History:  Procedure Laterality Date  . extraction of wisdom teeth    . KNEE SURGERY     four times   Family History: History reviewed. No  pertinent family history. Family Psychiatric  History:  Tobacco Screening: Have you used any form of tobacco in the last 30 days? (Cigarettes, Smokeless Tobacco, Cigars, and/or Pipes): Yes Tobacco use, Select all that apply: 5 or more cigarettes per day Are you interested in Tobacco Cessation Medications?: No, patient refused Counseled patient on smoking cessation including recognizing danger situations, developing coping skills and basic information about quitting provided: Refused/Declined practical counseling Social History:  History  Alcohol Use No    Comment: occ     History  Drug Use No    Additional Social History:                           Allergies:   Allergies  Allergen Reactions  . Amoxicillin Diarrhea    Has patient had a PCN reaction causing immediate rash, facial/tongue/throat swelling, SOB or lightheadedness with hypotension: No Has patient had a PCN reaction causing severe rash involving mucus membranes or skin necrosis: No Has patient had a PCN reaction that required hospitalization: No Has patient had a PCN reaction occurring within the last 10 years: No If all of the above answers are "NO", then may proceed with Cephalosporin use.   Marland Kitchen Dextromethorphan-Guaifenesin Other (See Comments)    unspecified  . Haloperidol Other (See Comments)    unspecified  . Meloxicam Other (See Comments)    unspecified  . Nsaids Other (See Comments)    Rectal bleeding  . Prednisone Other (See Comments)    unspecified  . Quetiapine Other (See Comments)    Psychosis with high dose (690m)  . Sudafed [Pseudoephedrine Hcl] Other (See Comments)    Hallucinations  . Ziprasidone Other (See Comments)    unspecified   Lab Results: No results found for this or any previous visit (from the past 48 hour(s)).  Blood Alcohol level:  Lab Results  Component Value Date   ETH <10 06/14/2017   ETH <5 029/52/8413   Metabolic Disorder Labs:  No results found for: HGBA1C,  MPG No results found for: PROLACTIN No results found for: CHOL, TRIG, HDL, CHOLHDL, VLDL, LDLCALC  Current Medications: Current Facility-Administered Medications  Medication Dose Route Frequency Provider Last Rate Last Dose  . acetaminophen (TYLENOL) tablet 650 mg  650 mg Oral Q6H PRN Okonkwo, Justina A, NP   650 mg at 06/17/17 0321  . alum & mag hydroxide-simeth (MAALOX/MYLANTA) 200-200-20 MG/5ML suspension 30 mL  30 mL Oral Q4H PRN Okonkwo, Justina A, NP      . fluPHENAZine (PROLIXIN) tablet 2.5 mg  2.5 mg Oral BID Cobos, FMyer Peer MD      . LORazepam (ATIVAN) tablet 1 mg  1 mg Oral Q6H PRN Cobos, Fernando A, MD      . magnesium hydroxide (MILK OF MAGNESIA) suspension 30 mL  30 mL Oral Daily PRN OLu Duffel Justina A, NP       PTA Medications: Prescriptions Prior to Admission  Medication Sig Dispense Refill Last Dose  . clonazePAM (KLONOPIN) 1 MG tablet Take 1-2 mg by mouth 2 (two) times daily as needed for anxiety.   2 unknown  . doxepin (SINEQUAN) 50 MG capsule Take 100 mg by mouth at bedtime.   unknown  . [START ON 08/19/2017] HYDROcodone-acetaminophen (NORCO) 7.5-325 MG  tablet Take 1 tablet by mouth 2 (two) times daily as needed for moderate pain.    unknown  . traMADol (ULTRAM) 50 MG tablet Take 50 mg by mouth 4 (four) times daily.    unknown    Musculoskeletal: Strength & Muscle Tone: within normal limits Gait & Station: normal Patient leans: N/A  Psychiatric Specialty Exam: Physical Exam  Vitals reviewed. Constitutional: He appears well-developed.  Cardiovascular: Normal rate.   Musculoskeletal: Normal range of motion.  Psychiatric: He has a normal mood and affect. His behavior is normal.    Review of Systems  Psychiatric/Behavioral: Positive for depression and hallucinations. The patient is nervous/anxious and has insomnia.     Blood pressure 102/74, pulse 93, temperature (!) 97.5 F (36.4 C), temperature source Oral, resp. rate 20, height 6' 1"  (1.854 m), weight 99.3  kg (219 lb), SpO2 100 %.Body mass index is 28.89 kg/m.  General Appearance: Guarded  Eye Contact:  Minimal  Speech:  Clear and Coherent  Volume:  Normal  Mood:  Depressed and Dysphoric  Affect:  Congruent  Thought Process:  Coherent  Orientation:  Full (Time, Place, and Person)  Thought Content:  Hallucinations: Auditory  Suicidal Thoughts:  No  Homicidal Thoughts:  No  Memory:  Immediate;   Fair  Judgement:  Fair  Insight:  Fair  Psychomotor Activity:  Normal  Concentration:  Concentration: Fair  Recall:  [employer] of Knowledge:  Fair  Language:  Fair  Akathisia:  No  Handed:  Right  AIMS (if indicated):     Assets:  Communication Skills Desire for Improvement Resilience  ADL's:  Intact  Cognition:  WNL  Sleep:  Number of Hours: 6      I agree with current treatment plan on 06/17/2017, Patient seen face-to-face for psychiatric evaluation follow-up, chart reviewed. Reviewed the information documented and agree with the treatment plan.  Treatment Plan Summary: Daily contact with patient to assess and evaluate symptoms and progress in treatment and Medication management   See SRA by MD  Will continue to monitor vitals ,medication compliance and treatment side effects while patient is here.  Reviewed labs: ,BAL -, UDS -  CSW will start working on disposition.  Patient to participate in therapeutic milieu   Observation Level/Precautions:  15 minute checks  Laboratory:  CBC Chemistry Profile UDS UA  Psychotherapy:  Individual and group session   Medications:  See SRA  Consultations:  Psychiatry and CSW  Discharge Concerns:  Safety, stabilization, and risk of access to medication and medication stabilization   Estimated LOS: 5-7days  Other:     Physician Treatment Plan for Primary Diagnosis: Schizoaffective disorder, bipolar type ([place]) Long Term Goal(s): Improvement in symptoms so as ready for discharge  Short Term Goals: Ability to identify changes in  lifestyle to reduce recurrence of condition will improve, Ability to verbalize feelings will improve, Ability to identify and develop effective coping behaviors will improve and Ability to maintain clinical measurements within normal limits will improve  Physician Treatment Plan for Secondary Diagnosis: Principal Problem:   Schizoaffective disorder, bipolar type ([place])  Long Term Goal(s): Improvement in symptoms so as ready for discharge  Short Term Goals: Ability to verbalize feelings will improve, Ability to demonstrate self-control will improve and Compliance with prescribed medications will improve  I certify that inpatient services furnished can reasonably be expected to improve the patient's condition.    Derrill Center, NP 10/10/201811:16 AM   I have discussed case with NP and have met  with patient Agree with NP assessment 46 year old male, widowed, has a 104 year old son who lives with patient's mother. Self employed . On disability.   Patient was brought to ED by EMS after observers called . He was found sitting on the grass by side of road, near vehicle. Was drowsy and slurring speech initially.  Patient states he had overdosed on Seroquel - states this was suicidal in intention.  Patient states " I was hearing voices really bad, really bad, tormenting me". Reports voices " talk about all sorts of sh....", but denies command hallucinations. Of note, patient minimizes depression at this time and attributes suicidal ideations to psychotic symptoms.  States hallucinations have been worsening since medications were changed several weeks ago. He states he has been changed from Prolixin to Perphenazine to Seroquel. States he has been on Seroquel x 1 -2 months. States he has been compliant. Patient states he has very limited memory of this event . Admission UDS negative, and admission BAL <10  History of prior psychiatric admissions, has been diagnosed with Schizoaffective Disorder.   Reports history of prior suicidal attempts by overdosing.    Medical History remarkable for HTN. History of neck pain.   Denies any drug or alcohol abuse .   Dx- Schizoaffective Disorder   Plan- Inpatient admission. Was started on Abilify and Doxepin, but states " I don't like any of those medications. I do best on Prolixin".  Start Prolixin 2.5 mgrs BID ( of note, patient has listed Haloperidol as a side effect, but states " it made me hear more voices, get sicker. Does not describe any actual Haloperidol side effect. States Prolixin has been well tolerated .  Of note, patient reports he is being  prescribed BZD and Opiate medications by  PCP, and states he has been taking regularly, up to a few days ago. Admission UDS negative for opiates and BZDs. No current symptoms of opiate or BZD withdrawal, vitals are stable.  Will order Ativan CIWA protocol ( PRN ) to minimize risk of WDL.  Monitor for potential opiate WDL symptoms.

## 2017-06-17 NOTE — Progress Notes (Signed)
D:  Patient's self inventory sheet, patient has poor sleep, no sleep medication given.  Good appetite, normal energy level, good concentration.  Denied depression and hopeless, rated anxiety #3.  Denied withdrawals.  Then checked runny nose.  Denied SI.  Physical problems, neck, back and leg aches.  Worst pain #6 in past 24 hours, pain medication is helpful.  Plans to maintain composure and get proper treatment.  No discharge plans. A:  Patient has refused multivitamin and abilify this morning.  Patient stated abilify makes him feel "physically uncomfortable".    Emotional support and encouragement given patient. R:  Patient denied SI and HI, contracts for safety.  Denied A/V hallucinations. Safety maintained with 15 minute checks.

## 2017-06-17 NOTE — Plan of Care (Signed)
Problem: Education: Goal: Will be free of psychotic symptoms Outcome: Progressing Nurse discussed depression/anxiety/coping skills with patient.   

## 2017-06-17 NOTE — BHH Suicide Risk Assessment (Addendum)
[facility] Admission Suicide Risk Assessment   Nursing information obtained from:  Patient Demographic factors:  Male, Divorced or widowed, Caucasian, Living alone Current Mental Status:  Suicidal ideation indicated by patient Loss Factors:  Financial problems / change in socioeconomic status, Loss of significant relationship Historical Factors:  Prior suicide attempts, Family history of mental illness or substance abuse Risk Reduction Factors:  Responsible for children under 48 years of age  Total Time spent with patient: 45 minutes Principal Problem: Schizoaffective Disorder  Diagnosis:   Patient Active Problem List   Diagnosis Date Noted  . Hallucinations [R44.3] 07/29/2014  . Hypokalemia [E87.6] 04/28/2014  . Drug overdose [T50.901A] 04/27/2014  . Schizoaffective disorder, bipolar type ([place]) [F25.0] 03/02/2014  . Anxiety state [F41.1] 03/02/2014  . Benzodiazepine dependence ([place]) [F13.20] 03/02/2014  . Chest pain [R07.9] 10/11/2013  . EKG abnormalities [R94.31] 10/11/2013  . Chronic back pain [M54.9, G89.29]     Continued Clinical Symptoms:  Alcohol Use Disorder Identification Test Final Score (AUDIT): 0 The "Alcohol Use Disorders Identification Test", Guidelines for Use in Primary Care, Second Edition.  World Pharmacologist [facility]). Score between 0-7:  no or low risk or alcohol related problems. Score between 8-15:  moderate risk of alcohol related problems. Score between 16-19:  high risk of alcohol related problems. Score 20 or above:  warrants further diagnostic evaluation for alcohol dependence and treatment.   CLINICAL FACTORS:  46 year old male, widowed, has a 64 year old son who lives with patient's mother. Self employed . On disability.   Patient was brought to ED by EMS after observers called . He was found sitting on the grass by side of road, near vehicle. Was drowsy and slurring speech initially.  Patient states he had overdosed on Seroquel - states this was  suicidal in intention.  Patient states " I was hearing voices really bad, really bad, tormenting me". Reports voices " talk about all sorts of sh....", but denies command hallucinations. Of note, patient minimizes depression at this time and attributes suicidal ideations to psychotic symptoms.  States hallucinations have been worsening since medications were changed several weeks ago. He states he has been changed from Prolixin to Perphenazine to Seroquel. States he has been on Seroquel x 1 -2 months. States he has been compliant. Patient states he has very limited memory of this event . Admission UDS negative, and admission BAL <10  History of prior psychiatric admissions, has been diagnosed with Schizoaffective Disorder.  Reports history of prior suicidal attempts by overdosing.    Medical History remarkable for HTN. History of neck pain.   Denies any drug or alcohol abuse .   Dx- Schizoaffective Disorder   Plan- Inpatient admission. Was started on Abilify and Doxepin, but states " I don't like any of those medications. I do best on Prolixin".  Start Prolixin 2.5 mgrs BID ( of note, patient has listed Haloperidol as a side effect, but states " it made me hear more voices, get sicker. Does not describe any actual Haloperidol side effect. States Prolixin has been well tolerated .  Of note, patient reports he is being  prescribed BZD and Opiate medications by  PCP, and states he has been taking regularly, up to a few days ago. Admission UDS negative for opiates and BZDs. No current symptoms of opiate or BZD withdrawal, vitals are stable.  Will order Ativan CIWA protocol ( PRN ) to minimize risk of WDL.  Monitor for potential opiate WDL symptoms.  Musculoskeletal: Strength & Muscle Tone: within normal limits Gait & Station: normal Patient leans: N/A  Psychiatric Specialty Exam: Physical Exam  ROS  Denies headache, no chest pain, no shortness of breath, no vomiting   Blood  pressure 102/74, pulse 93, temperature (!) 97.5 F (36.4 C), temperature source Oral, resp. rate 20, height 6\' 1"  (1.854 m), weight 99.3 kg (219 lb), SpO2 100 %.Body mass index is 28.89 kg/m.  General Appearance: Fairly Groomed  Eye Contact:  Fair  Speech:  Slow  Volume:  Decreased- slightly slurred   Mood:  presents dysphoric, denies depression  Affect:  dysphoric, vaguely irritable   Thought Process:  Linear and Descriptions of Associations: Intact- slightly slowed   Orientation:  Other:  slightly drowsy , but alertable, and is 0x3 .  Thought Content:  reports auditory hallucinations, no delusions expressed, does not appear internally preoccupied   Suicidal Thoughts:  No denies any suicidal ideations, denies any homicidal ideations   Homicidal Thoughts:  No  Memory:  recent and remote grossly intact  Judgement:  Fair  Insight:  Fair  Psychomotor Activity:  Decreased  Concentration:  Concentration: Good and Attention Span: Good  Recall:  Good  Fund of Knowledge:  Good  Language:  Good  Akathisia:  Negative  Handed:  Right  AIMS (if indicated):     Assets:  Communication Skills Desire for Improvement Resilience  ADL's:  Intact  Cognition:  WNL  Sleep:  Number of Hours: 6      COGNITIVE FEATURES THAT CONTRIBUTE TO RISK:  Closed-mindedness and Loss of executive function    SUICIDE RISK:   Moderate:  Frequent suicidal ideation with limited intensity, and duration, some specificity in terms of plans, no associated intent, good self-control, limited dysphoria/symptomatology, some risk factors present, and identifiable protective factors, including available and accessible social support.  PLAN OF CARE: Patient will be admitted to inpatient psychiatric unit for stabilization and safety. Will provide and encourage milieu participation. Provide medication management and maked adjustments as needed.  Will follow daily.    I certify that inpatient services furnished can reasonably  be expected to improve the patient's condition.   Jenne Campus, MD 06/17/2017, 11:00 AM

## 2017-06-17 NOTE — Progress Notes (Signed)
Adult Psychoeducational Group Note  Date:  06/17/2017 Time:  9:05 PM  Group Topic/Focus:  Wrap-Up Group:   The focus of this group is to help patients review their daily goal of treatment and discuss progress on daily workbooks.  Participation Level:  Active  Participation Quality:  Appropriate  Affect:  Appropriate  Cognitive:  Appropriate  Insight: Appropriate  Engagement in Group:  Engaged  Modes of Intervention:  Discussion  Additional Comments:The patient expressed that he attended group.The patient also said that he rates today a 5.  Nash Shearer 06/17/2017, 9:05 PM

## 2017-06-17 NOTE — Progress Notes (Signed)
Recreation Therapy Notes  Date: 06/17/17 Time: 1000 Location: [address] Dayroom  Group Topic: Self-Esteem  Goal Area(s) Addresses:  Patient will successfully identify positive attributes about themselves.  Patient will successfully identify benefit of improved self-esteem.  Intervention: Pencils, paper  Activity: Creative Writing.  Patients were divided into two groups of 4.  Each person was to identify the positive traits they posses.  The group was to then create a story based on a superhero the created using the qualities they identified in themselves.  They were to include in the story how the superhero would use their positive traits to help others overcome their struggles such as depression, anxiety, addiction, etc.  Education:  Self-Esteem, Discharge Planning.   Education Outcome: Acknowledges education/In group clarification offered/Needs additional education  Clinical Observations/Feedback: Pt did not attend group.   Victorino Sparrow, LRT/CTRS         Victorino Sparrow A 06/17/2017 12:55 PM

## 2017-06-18 DIAGNOSIS — F39 Unspecified mood [affective] disorder: Secondary | ICD-10-CM

## 2017-06-18 MED ORDER — HYDROXYZINE HCL 50 MG PO TABS
50.0000 mg | ORAL_TABLET | Freq: Every day | ORAL | Status: DC
  Administered 2017-06-18 – 2017-06-21 (×4): 50 mg via ORAL
  Filled 2017-06-18 (×8): qty 1

## 2017-06-18 MED ORDER — SUMATRIPTAN SUCCINATE 25 MG PO TABS
25.0000 mg | ORAL_TABLET | Freq: Every day | ORAL | Status: DC | PRN
  Administered 2017-06-23: 25 mg via ORAL
  Filled 2017-06-18 (×2): qty 1

## 2017-06-18 MED ORDER — AMLODIPINE BESYLATE 5 MG PO TABS
5.0000 mg | ORAL_TABLET | Freq: Every day | ORAL | Status: DC
  Administered 2017-06-18 – 2017-06-25 (×8): 5 mg via ORAL
  Filled 2017-06-18 (×12): qty 1

## 2017-06-18 NOTE — Plan of Care (Signed)
Problem: Coping: Goal: Ability to cope will improve Outcome: Progressing Pt denies SI at this time   

## 2017-06-18 NOTE — Progress Notes (Signed)
[facility] MD Progress Note  06/18/2017 2:33 PM Mali A Lizama  MRN:  [id number]  Subjective: Mali reports, "I did not sleep well last night. I took some medicines, but it did not help. I'm not depressed, but still hearing voices. I don't pay attention to what they are saying. I usually take Clonazepam 1 mg for sleep".  Objective: [place]i is seen in his room. He is lying down in his bed. He had the blanket over his face. He is verbally responsive. He made no eye contact. He says he is still hearing voices. He complains of not sleeping at night. He denies being depressed. He says he is attending group sessions. So far, he is tolerating his medication regimen. Denies side effects. Staff will continue provide support.  Principal Problem: Schizoaffective disorder, bipolar type ([place])  Diagnosis:   Patient Active Problem List   Diagnosis Date Noted  . Hallucinations [R44.3] 07/29/2014  . Hypokalemia [E87.6] 04/28/2014  . Drug overdose [T50.901A] 04/27/2014  . Schizoaffective disorder, bipolar type ([place]) [F25.0] 03/02/2014  . Anxiety state [F41.1] 03/02/2014  . Benzodiazepine dependence ([place]) [F13.20] 03/02/2014  . Chest pain [R07.9] 10/11/2013  . EKG abnormalities [R94.31] 10/11/2013  . Chronic back pain [M54.9, G89.29]    Total Time spent with patient: 25 minutes  Past Psychiatric History: Schizoaffective disorder, bipolar-type.  Past Medical History:  Past Medical History:  Diagnosis Date  . Asthma   . CHF (congestive heart failure) ([place])   . Chronic back pain   . COPD (chronic obstructive pulmonary disease) ([place])   . Knee pain, chronic   . Migraines   . Schizophrenia, schizo-affective ([place])     Past Surgical History:  Procedure Laterality Date  . extraction of wisdom teeth    . KNEE SURGERY     four times   Family History: History reviewed. No pertinent family history.  Family Psychiatric  History: See H&P.  Social History:  History  Alcohol Use No    Comment: occ     History   Drug Use No    Social History   Social History  . Marital status: Single    Spouse name: N/A  . Number of children: N/A  . Years of education: N/A   Social History Main Topics  . Smoking status: Current Every Day Smoker    Packs/day: 3.00    Years: 10.00    Types: Cigarettes    Last attempt to quit: 02/26/2014  . Smokeless tobacco: Never Used  . Alcohol use No     Comment: occ  . Drug use: No  . Sexual activity: No   Other Topics Concern  . None   Social History Narrative  . None   Additional Social History:   Sleep: Fair  Appetite:  Good  Current Medications: Current Facility-Administered Medications  Medication Dose Route Frequency Provider Last Rate Last Dose  . acetaminophen (TYLENOL) tablet 650 mg  650 mg Oral Q6H PRN Okonkwo, Justina A, NP   650 mg at 06/18/17 0910  . alum & mag hydroxide-simeth (MAALOX/MYLANTA) 200-200-20 MG/5ML suspension 30 mL  30 mL Oral Q4H PRN Okonkwo, Justina A, NP   30 mL at 06/18/17 0220  . benztropine (COGENTIN) tablet 0.5 mg  0.5 mg Oral BID PRN Cobos, Myer Peer, MD      . fluPHENAZine (PROLIXIN) tablet 2.5 mg  2.5 mg Oral BID Cobos, Myer Peer, MD   2.5 mg at 06/18/17 0900  . hydrOXYzine (ATARAX/VISTARIL) tablet 25 mg  25 mg Oral Q6H PRN  Cobos, Myer Peer, MD   25 mg at 06/18/17 0220  . loperamide (IMODIUM) capsule 2-4 mg  2-4 mg Oral PRN Cobos, Myer Peer, MD      . LORazepam (ATIVAN) tablet 1 mg  1 mg Oral Q6H PRN Cobos, Myer Peer, MD   1 mg at 06/17/17 2140  . magnesium hydroxide (MILK OF MAGNESIA) suspension 30 mL  30 mL Oral Daily PRN Okonkwo, Justina A, NP   30 mL at 06/17/17 1322  . multivitamin with minerals tablet 1 tablet  1 tablet Oral Daily Cobos, Myer Peer, MD   1 tablet at 06/18/17 0900  . ondansetron (ZOFRAN-ODT) disintegrating tablet 4 mg  4 mg Oral Q6H PRN Cobos, Myer Peer, MD   4 mg at 06/17/17 1322  . thiamine (VITAMIN B-1) tablet 100 mg  100 mg Oral Daily Cobos, Myer Peer, MD   100 mg at 06/18/17 0900   Lab  Results: No results found for this or any previous visit (from the past 48 hour(s)).  Blood Alcohol level:  Lab Results  Component Value Date   ETH <10 06/14/2017   ETH <5 09/47/0962   Metabolic Disorder Labs: No results found for: HGBA1C, MPG No results found for: PROLACTIN No results found for: CHOL, TRIG, HDL, CHOLHDL, VLDL, LDLCALC  Physical Findings: AIMS: Facial and Oral Movements Muscles of Facial Expression: None, normal Lips and Perioral Area: None, normal Jaw: None, normal Tongue: None, normal,Extremity Movements Upper (arms, wrists, hands, fingers): None, normal Lower (legs, knees, ankles, toes): None, normal, Trunk Movements Neck, shoulders, hips: None, normal, Overall Severity Severity of abnormal movements (highest score from questions above): None, normal Incapacitation due to abnormal movements: None, normal Patient's awareness of abnormal movements (rate only patient's report): No Awareness, Dental Status Current problems with teeth and/or dentures?: No Does patient usually wear dentures?: No  CIWA:  CIWA-Ar Total: 2 COWS:  COWS Total Score: 9  Musculoskeletal: Strength & Muscle Tone: within normal limits Gait & Station: normal Patient leans: N/A  Psychiatric Specialty Exam: Physical Exam  Nursing note and vitals reviewed.   Review of Systems  Psychiatric/Behavioral: Negative for depression, memory loss, substance abuse and suicidal ideas. The patient is nervous/anxious and has insomnia.     Blood pressure (!) 123/91, pulse 85, temperature 99.1 F (37.3 C), temperature source Oral, resp. rate 16, height 6\' 1"  (1.854 m), weight 99.3 kg (219 lb), SpO2 100 %.Body mass index is 28.89 kg/m.  General Appearance: Fairly Groomed  Eye Contact:  Fair  Speech:  Slow  Volume:  Decreased- slightly slurred   Mood:  presents dysphoric, denies depression  Affect:  dysphoric, vaguely irritable   Thought Process:  Linear and Descriptions of Associations: Intact-  slightly slowed   Orientation:  Other:  slightly drowsy , but alertable, and is 0x3 .  Thought Content:  reports auditory hallucinations, no delusions expressed, does not appear internally preoccupied   Suicidal Thoughts:  No denies any suicidal ideations, denies any homicidal ideations   Homicidal Thoughts:  No  Memory:  recent and remote grossly intact  Judgement:  Fair  Insight:  Fair  Psychomotor Activity:  Decreased  Concentration:  Concentration: Good and Attention Span: Good  Recall:  Good  Fund of Knowledge:  Good  Language:  Good  Akathisia:  Negative  Handed:  Right  AIMS (if indicated):     Assets:  Communication Skills Desire for Improvement Resilience  ADL's:  Intact  Cognition:  WNL  Sleep:  Number of Hours: 6  Treatment Plan Summary: Daily contact with patient to assess and evaluate symptoms and progress in treatment.  Will continue today 06/18/17 plan as below except where it is noted.  -Continue Cogentin 0.5 mg prn for EPS. -Continue fluphenazine (Prolixin) 2.5 mg bid for mood control. -Hydroxyzine 25 mg prn for anxiety Q 6 hours. -Continue Ativan regimen 1 mg tablet Q 6 hours prn for CIWA >10. -Initiate Hydroxyzine 50 mg Q hs for insomnia.  -Will initiate Hydroxyzine 50 mg Q hs for insomnia. -Will obtain labs: Lipid panel, TSH, Prolactin levels, HGBA1c.  - Continue 15 minutes observation for safety concerns - Encouraged to participate in milieu therapy and group therapy counseling sessions and also work with coping skills -  Develop treatment plan to decrease risk of relapse upon discharge and to reduce the need for readmission. -  Psycho-social education regarding relapse prevention and self care. - Health care follow up as needed for medical problems. - Restart home medications where appropriate.  Encarnacion Slates, NP, [place], [facility] 06/18/2017, 2:33 PM

## 2017-06-18 NOTE — Progress Notes (Signed)
Patient ID: [name], male   DOB: December 25, 1970, 46 y.o.   MRN: [id number]  Writer contacted MD Daron Offer this evening for patient's elevated BP. Order for 5 mg Norvasc was given and patient has received. Patient reports a headache but otherwise is asymptomatic. He reports "I'm going through withdrawal." Based on patient's CIWA score he was given Ativan 1 mg. Patient reports, "thanks for giving it to me but that didn't help." Q15 minute safety checks are maintained.

## 2017-06-18 NOTE — Progress Notes (Signed)
Recreation Therapy Notes  Date: 06/18/17 Time: 1000 Location: 500 Hall Dayroom  Group Topic: Communication, Team Building, Problem Solving  Goal Area(s) Addresses:  Patient will effectively work with peer towards shared goal.  Patient will identify skill used to make activity successful.  Patient will identify how skills used during activity can be used to reach post d/c goals.   Behavioral Response: Engaged  Intervention: STEM Activity   Activity: Straw Bridge. In teams, patients were asked to build a straw bridge that could hold a small puzzle box.  Each group was given 20 straws and 2 ft of masking tape to complete the project.    Education: Education officer, community, Dentist.   Education Outcome: Acknowledges education/In group clarification offered/Needs additional education.   Clinical Observations/Feedback: Pt looked tired and stated he had a headache.  Pt was able to come up with a concept with his peer and build a bridge.  Pt was pleasant and engaged throughout the activity.    [name], LRT/CTRS         Ria Comment, Sheranda Seabrooks A 06/18/2017 11:58 AM

## 2017-06-18 NOTE — Progress Notes (Signed)
Adult Psychoeducational Group Note  Date:  06/18/2017 Time:  8:47 PM  Group Topic/Focus:  Wrap-Up Group:   The focus of this group is to help patients review their daily goal of treatment and discuss progress on daily workbooks.  Participation Level:  Active  Participation Quality:  Appropriate  Affect:  Appropriate  Cognitive:  Appropriate  Insight: Appropriate  Engagement in Group:  Engaged  Modes of Intervention:  Discussion  Additional Comments: The patient expressed that he attended the communication group.The patient also said that rates today a 6.   Nash Shearer 06/18/2017, 8:47 PM

## 2017-06-18 NOTE — Progress Notes (Signed)
Recreation Therapy Notes  INPATIENT RECREATION THERAPY ASSESSMENT  Patient Details Name: [name] MRN: [id number] DOB: 1971/04/21 Today's Date: 06/18/2017  Patient Stressors: Other (Comment) (the voices)  Pt stated he was here for hearing voices and tried to overdose.  Coping Skills:   Isolate, Avoidance, Self-Injury, Talking, Music, Sports  Personal Challenges: Anger, Communication, Concentration, Decision-Making, Problem-Solving, Social Interaction, Stress Management  Leisure Interests (2+):  Individual - Computer, Individual - TV, Individual - Other (Comment) (Listen to music on Youtube; drink [employer]; Sleep)  Awareness of Community Resources:  Yes  Community Resources:  [place]  Current Use: No  If no, Barriers?: Other (Comment) (I don't need them)  Patient Strengths:  Sing; follow  Patient Identified Areas of Improvement:  See Above  Current Recreation Participation:  Daily  Patient Goal for Hospitalization:  "Get help"  [place] of Residence:  Agency of Residence:  [place]  Current [place] (including self-harm):  No  Current HI:  No  Consent to Intern Participation: N/A   Victorino Sparrow, LRT/CTRS  Victorino Sparrow A 06/18/2017, 2:01 PM

## 2017-06-18 NOTE — BHH Counselor (Signed)
Adult Comprehensive Assessment  Patient ID: [name], male   DOB: 1971-05-15, 46 y.o.   MRN: [id number]  nformation Source: Information source: Patient  Current Stressors:  Employment / Job issues: on disability, work on Radio producer, rebuild Environmental manager / Lack of resources (include bankruptcy): on fixed income, plus small additional income  Living/Environment/Situation:  Living Arrangements: Alone Living conditions (as described by patient or guardian): Pt lives alone in [place] in an apartment.  How long has patient lived in current situation?: 11 years What is atmosphere in current home: Comfortable  Family History:  Marital status: Widowed Widowed, when?: 2005 Does patient have children?: Yes How many children?: 1 How is patient's relationship with their children?: Pt has a 15 year old son - pt's mom has full custody of him. Pt reports he is working on his relationship with his son.   Childhood History:  By whom was/is the patient raised?: Mother/father and step-parent;Mother Additional childhood history information: Pt reports being raised by mother and step father and reports step father was a "pot head". Pt describes his childhood as good.  Description of patient's relationship with caregiver when they were a child: Pt reports getting along well with parents growing up.  Patient's description of current relationship with people who raised him/her: Pt reports having a "so-so"  relationship with mother-"we argue a lot."  Father is out of prison-relationship is weird-"I hear his voice a lot" Does patient have siblings?: Yes Number of Siblings: 2 Description of patient's current relationship with siblings: reports having a strained relationship with brother, decent relationship with sister Did patient suffer any verbal/emotional/physical/sexual abuse as a child?: No Did patient suffer from severe childhood neglect?: No Has patient ever been sexually  abused/assaulted/raped as an adolescent or adult?: No Was the patient ever a victim of a crime or a disaster?: No Witnessed domestic violence?: No Has patient been effected by domestic violence as an adult?: No  Education:  Highest grade of school patient has completed: some college Currently a Ship broker?: No Learning disability?: No  Employment/Work Situation:  Employment situation: On disability Why is patient on disability: mental health How long has patient been on disability: since 42 Patient's job has been impacted by current illness: No What is the longest time patient has a held a job?: 10 years 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 Has patient ever been in the [employer]?: No Has patient ever served in Recruitment consultant?: No  Financial Resources:  Museum/gallery curator resources: Public affairs consultant;Food stamps Does patient have a representative payee or guardian?: No  Alcohol/Substance Abuse:  What has been your use of drugs/alcohol within the last 12 months?: Pt denies alcohol and drug abuse If attempted suicide, did drugs/alcohol play a role in this?: No Alcohol/Substance Abuse Treatment Hx: Denies past history If yes, describe treatment: N/A Has alcohol/substance abuse ever caused legal problems?: No  Social Support System: Heritage manager System: Poor Describe Community Support System: Reports no support Type of faith/religion: Christian How does patient's faith help to cope with current illness?: prayer, occasional church attendance  Leisure/Recreation:  Leisure and Hobbies: play video games, work on Land:  What things does the patient do well?: make people laugh, rebuild computers In what areas does patient struggle / problems for patient: A/V hallucination  Discharge Plan:  Does patient have access to transportation?: Yes Will patient be returning to same living situation after discharge?:  Yes Currently receiving community mental health services: Yes [facility] If  no, would patient like referral for services when discharged?: Yes Is open to referral to ACT team Does patient have financial barriers related to discharge medications?: No    Summary/Recommendations:   Summary and Recommendations (to be completed by the evaluator): [name] is a 46 YO Caucasian male diagnosed with Schizoaffective Disorder Bipolar type.  He presents IVC after an intentional OD attempt.  "I get so tired of hearing the voices always saying mean things that I just couldn't take it anymore."  States the voices never go away, and that changing medications does not make a difference.  At d/c, [name] will return home.  He is oen to an ACT team referral.  While here, he can benefit from crises stabilization, medication management, therapeutic milieu and referral for service to [facility] ACT team  Trish Mage. 06/18/2017

## 2017-06-18 NOTE — BHH Group Notes (Signed)
LCSW Group Therapy 06/18/2017 1:15pm  Type of Therapy and Topic:  Group Therapy:  Change and Accountability  Participation Level:  Did Not Attend  Description of Group In this group, patients discussed power and accountability for change.  The group identified the challenges related to accountability and the difficulty of accepting the outcomes of negative behaviors.  Patients were encouraged to openly discuss a challenge/change they could take responsibility for.  Patients discussed the use of "change talk" and positive thinking as ways to support achievement of personal goals.  The group discussed ways to give support and empowerment to peers.  Therapeutic Goals: 1. Patients will state the relationship between personal power and accountability in the change process 2. Patients will identify the positive and negative consequences of a personal choice they have made 3. Patients will identify one challenge/choice they will take responsibility for making 4. Patients will discuss the role of "change talk" and the impact of positive thinking as it supports successful personal change 5. Patients will verbalize support and affirmation of change efforts in peers  Summary of Patient Progress:    Therapeutic Modalities Solution Focused Brief Therapy Motivational Interviewing Cognitive Behavioral Therapy  Riley Lam Work 06/18/2017 2:44 PM

## 2017-06-18 NOTE — Plan of Care (Signed)
Problem: Coping: Goal: Ability to cope will improve Outcome: Progressing Pt passive AH?- contracts, pt stated he wanted an increase in his antipsychotic

## 2017-06-18 NOTE — Progress Notes (Signed)
D: Pt denies SI/HI/VH, +ve AH- non command. Pt is pleasant and cooperative. Pt stated he wanted to get his Prolixin at night and he wanted to up the dosage. Pt was seen on the unit this evening, pt appears a little hypomanic, but is redirectable this evening. Pt appears to be focused on his medications this evening.   A: Pt was offered support and encouragement. Pt was given scheduled medications. Pt was encourage to attend groups. Q 15 minute checks were done for safety.   R: Pt is taking medication. Pt has no complaints.Pt receptive to treatment and safety maintained on unit.

## 2017-06-18 NOTE — Progress Notes (Signed)
D: Pt continues to be anxious, but pleasant. Was visiting with his mother at the start of shift. Pt's mother asked the writer about the incident involving the pt and one of his male peers. Writer explained what was given during report. Informed mother that pt made inappropriate comments. Pt interjected that he leaned over to tell the peer about a dream he had about her. Afterwards pt's mother informed him that he was inappropriate. However, she was concerned that he now has to eat alone and stay on the unit for recreation. Pt has no questions or concerns.    A:  Support and encouragement was offered. 15 min checks continued for safety.  R: Pt remains safe.

## 2017-06-18 NOTE — BHH Suicide Risk Assessment (Signed)
[place] INPATIENT:  Family/Significant Other Suicide Prevention Education  Suicide Prevention Education:  Patient Refusal for Family/Significant Other Suicide Prevention Education: The patient [name] has refused to provide written consent for family/significant other to be provided Family/Significant Other Suicide Prevention Education during admission and/or prior to discharge.  Physician notified.  Trish Mage 06/18/2017, 9:13 AM

## 2017-06-18 NOTE — Tx Team (Signed)
Interdisciplinary Treatment and Diagnostic Plan Update  06/18/2017 Time of Session: 9:07 AM  [name] MRN: [id number]  Principal Diagnosis: Schizoaffective disorder, bipolar type [facility])  Secondary Diagnoses: Principal Problem:   Schizoaffective disorder, bipolar type ([place])   Current Medications:  Current Facility-Administered Medications  Medication Dose Route Frequency Provider Last Rate Last Dose  . acetaminophen (TYLENOL) tablet 650 mg  650 mg Oral Q6H PRN Okonkwo, Justina A, NP   650 mg at 06/18/17 0220  . alum & mag hydroxide-simeth (MAALOX/MYLANTA) 200-200-20 MG/5ML suspension 30 mL  30 mL Oral Q4H PRN Okonkwo, Justina A, NP   30 mL at 06/18/17 0220  . benztropine (COGENTIN) tablet 0.5 mg  0.5 mg Oral BID PRN Cobos, Myer Peer, MD      . fluPHENAZine (PROLIXIN) tablet 2.5 mg  2.5 mg Oral BID Cobos, Myer Peer, MD   2.5 mg at 06/18/17 0900  . hydrOXYzine (ATARAX/VISTARIL) tablet 25 mg  25 mg Oral Q6H PRN Cobos, Myer Peer, MD   25 mg at 06/18/17 0220  . loperamide (IMODIUM) capsule 2-4 mg  2-4 mg Oral PRN Cobos, Myer Peer, MD      . LORazepam (ATIVAN) tablet 1 mg  1 mg Oral Q6H PRN Cobos, Myer Peer, MD   1 mg at 06/17/17 2140  . magnesium hydroxide (MILK OF MAGNESIA) suspension 30 mL  30 mL Oral Daily PRN Okonkwo, Justina A, NP   30 mL at 06/17/17 1322  . multivitamin with minerals tablet 1 tablet  1 tablet Oral Daily Cobos, Myer Peer, MD   1 tablet at 06/18/17 0900  . ondansetron (ZOFRAN-ODT) disintegrating tablet 4 mg  4 mg Oral Q6H PRN Cobos, Myer Peer, MD   4 mg at 06/17/17 1322  . thiamine (VITAMIN B-1) tablet 100 mg  100 mg Oral Daily Cobos, Myer Peer, MD   100 mg at 06/18/17 0900    PTA Medications: Prescriptions Prior to Admission  Medication Sig Dispense Refill Last Dose  . clonazePAM (KLONOPIN) 1 MG tablet Take 1-2 mg by mouth 2 (two) times daily as needed for anxiety.   2 unknown  . doxepin (SINEQUAN) 50 MG capsule Take 100 mg by mouth at bedtime.   unknown  .  [START ON 08/19/2017] HYDROcodone-acetaminophen (NORCO) 7.5-325 MG tablet Take 1 tablet by mouth 2 (two) times daily as needed for moderate pain.    unknown  . traMADol (ULTRAM) 50 MG tablet Take 50 mg by mouth 4 (four) times daily.    unknown    Patient Stressors: Marital or family conflict Medication change or noncompliance  Patient Strengths: Capable of independent living Curator fund of knowledge Motivation for treatment/growth  Treatment Modalities: Medication Management, Group therapy, Case management,  1 to 1 session with clinician, Psychoeducation, Recreational therapy.   Physician Treatment Plan for Primary Diagnosis: Schizoaffective disorder, bipolar type ([place]) Long Term Goal(s): Improvement in symptoms so as ready for discharge  Short Term Goals: Ability to identify changes in lifestyle to reduce recurrence of condition will improve Ability to verbalize feelings will improve Ability to identify and develop effective coping behaviors will improve Ability to maintain clinical measurements within normal limits will improve Ability to verbalize feelings will improve Ability to demonstrate self-control will improve Compliance with prescribed medications will improve  Medication Management: Evaluate patient's response, side effects, and tolerance of medication regimen.  Therapeutic Interventions: 1 to 1 sessions, Unit Group sessions and Medication administration.  Evaluation of Outcomes: Progressing  Physician Treatment Plan for Secondary Diagnosis: Principal  Problem:   Schizoaffective disorder, bipolar type ([place])   Long Term Goal(s): Improvement in symptoms so as ready for discharge  Short Term Goals: Ability to identify changes in lifestyle to reduce recurrence of condition will improve Ability to verbalize feelings will improve Ability to identify and develop effective coping behaviors will improve Ability to maintain clinical measurements within  normal limits will improve Ability to verbalize feelings will improve Ability to demonstrate self-control will improve Compliance with prescribed medications will improve  Medication Management: Evaluate patient's response, side effects, and tolerance of medication regimen.  Therapeutic Interventions: 1 to 1 sessions, Unit Group sessions and Medication administration.  Evaluation of Outcomes: Progressing   RN Treatment Plan for Primary Diagnosis: Schizoaffective disorder, bipolar type ([place]) Long Term Goal(s): Knowledge of disease and therapeutic regimen to maintain health will improve  Short Term Goals: Ability to identify and develop effective coping behaviors will improve and Compliance with prescribed medications will improve  Medication Management: RN will administer medications as ordered by provider, will assess and evaluate patient's response and provide education to patient for prescribed medication. RN will report any adverse and/or side effects to prescribing provider.  Therapeutic Interventions: 1 on 1 counseling sessions, Psychoeducation, Medication administration, Evaluate responses to treatment, Monitor vital signs and CBGs as ordered, Perform/monitor CIWA, COWS, AIMS and Fall Risk screenings as ordered, Perform wound care treatments as ordered.  Evaluation of Outcomes: Progressing   LCSW Treatment Plan for Primary Diagnosis: Schizoaffective disorder, bipolar type ([place]) Long Term Goal(s): Safe transition to appropriate next level of care at discharge, Engage patient in therapeutic group addressing interpersonal concerns.  Short Term Goals: Engage patient in aftercare planning with referrals and resources  Therapeutic Interventions: Assess for all discharge needs, 1 to 1 time with Social worker, Explore available resources and support systems, Assess for adequacy in community support network, Educate family and significant other(s) on suicide prevention, Complete  Psychosocial Assessment, Interpersonal group therapy.  Evaluation of Outcomes: Met  Return home, follow up [facility] ACT team   Progress in Treatment: Attending groups: Yes Participating in groups: Yes Taking medication as prescribed: Yes Toleration medication: Yes, no side effects reported at this time Family/Significant other contact made: No Patient understands diagnosis: Yes AEB asking for help with voices Discussing patient identified problems/goals with staff: Yes Medical problems stabilized or resolved: Yes Denies suicidal/homicidal ideation: Yes Issues/concerns per patient self-inventory: None Other: N/A  New problem(s) identified: None identified at this time.   New Short Term/Long Term Goal(s): "These voices make me feel like killing myself."  Discharge Plan or Barriers:   Reason for Continuation of Hospitalization: Depression Hallucinations  Medication stabilization Suicidal ideation   Estimated Length of Stay: 10/16  Attendees: Patient: [name] 06/18/2017  9:07 AM  Physician: Hampton Abbot, MD 06/18/2017  9:07 AM  Nursing: Sena Hitch, RN 06/18/2017  9:07 AM  RN Care Manager: Lars Pinks, RN 06/18/2017  9:07 AM  Social Worker: Ripley Fraise 06/18/2017  9:07 AM  Recreational Therapist: Winfield Cunas 06/18/2017  9:07 AM  Other: Norberto Sorenson 06/18/2017  9:07 AM  Other:  06/18/2017  9:07 AM    Scribe for Treatment Team:  Roque Lias LCSW 06/18/2017 9:07 AM

## 2017-06-18 NOTE — Progress Notes (Signed)
D: Pt continues to be very flat and depressed on the unit today. Pt also continues to be very isolative. Pt complained of being anxious, vistaril offered but pt declined stating he came for benzo's but not vistaril. As per self inventory, pt reported that his depression was a 0, his hopelessness was a 0, and that his anxiety was a 0. Pt reported being negative SI/HI, no AH/VH noted. A: 15 min checks continued for patient safety. R: Pts safety maintained.

## 2017-06-19 LAB — GLUCOSE, CAPILLARY: Glucose-Capillary: 118 mg/dL — ABNORMAL HIGH (ref 65–99)

## 2017-06-19 MED ORDER — FLUPHENAZINE HCL 2.5 MG PO TABS
2.5000 mg | ORAL_TABLET | Freq: Once | ORAL | Status: AC
  Administered 2017-06-19: 2.5 mg via ORAL
  Filled 2017-06-19: qty 1

## 2017-06-19 MED ORDER — POTASSIUM CHLORIDE CRYS ER 10 MEQ PO TBCR
10.0000 meq | EXTENDED_RELEASE_TABLET | Freq: Two times a day (BID) | ORAL | Status: AC
  Filled 2017-06-19 (×4): qty 1

## 2017-06-19 MED ORDER — FLUPHENAZINE HCL 5 MG PO TABS
5.0000 mg | ORAL_TABLET | Freq: Two times a day (BID) | ORAL | Status: DC
  Administered 2017-06-19 – 2017-06-20 (×2): 5 mg via ORAL
  Filled 2017-06-19 (×4): qty 1

## 2017-06-19 MED ORDER — LORAZEPAM 1 MG PO TABS
1.0000 mg | ORAL_TABLET | Freq: Four times a day (QID) | ORAL | Status: AC | PRN
  Administered 2017-06-19 – 2017-06-20 (×2): 1 mg via ORAL
  Filled 2017-06-19 (×2): qty 1

## 2017-06-19 NOTE — Progress Notes (Signed)
Recreation Therapy Notes  Date: 06/19/17 Time: 1015 Location: 500 Hall Dayroom  Group Topic: Communication  Goal Area(s) Addresses:  Patient will effectively communicate with peers in group.  Patient will verbalize benefit of healthy communication. Patient will verbalize positive effect of healthy communication on post d/c goals.  Patient will identify communication techniques that made activity effective for group.   Intervention:  [employer], writing utensils, blank paper  Activity:  Back to Back Drawings.  Patients were seated in pairs back to back.  One patient would give directions and the other patient would listen.  The patients doing the talking were given a geometrical picture which they were to give instructions for their partner to draw.  The patients listening where to draw the pictures being described.  Patients would switch roles on the second round.  Education: Communication, Discharge Planning  Education Outcome: Acknowledges understanding/In group clarification offered/Needs additional education.   Clinical Observations/Feedback: Pt did not attend group.    Victorino Sparrow, LRT/CTRS         Victorino Sparrow A 06/19/2017 11:27 AM

## 2017-06-19 NOTE — Progress Notes (Signed)
Pt stated he was having a hard time sleeping , when asked what he takes for sleep pt responded" I take everything for sleep"

## 2017-06-19 NOTE — Progress Notes (Signed)
DAR NOTE: Patient presents with anxious affect and depressed mood. Pt reportedly called 911 this morning, pt stated he is being persecuted, was persecuted all night long, could not sleep because people ar snoring and ear plugs are not working. Pt stated he wanted the cops to come and blow staff up. Pt has been isolative, and been observed pacing on the hall way. Denies pain, auditory and visual hallucinations.  Rates depression at 0, hopelessness at 0, and anxiety at 0.  Maintained on routine safety checks.  Medications given as prescribed.  Support and encouragement offered as needed. Will continue to monitor.

## 2017-06-19 NOTE — Plan of Care (Signed)
Problem: Education: Goal: Will be free of psychotic symptoms Outcome: Not Progressing Pt continues to endorse AVH, pt seen pacing the unit responding to internal stimuli  Problem: Coping: Goal: Ability to cope will improve Outcome: Progressing Pt denies SI at this time

## 2017-06-19 NOTE — Progress Notes (Signed)
D: Pt denies SI/HI/, +ve AVH. Pt is pleasant and cooperative, most of the evening. Pt presents irritable at times stating he continues to hear/ see things and he was not getting the medication like the doctor told him" he wrote it for two at [facility] bed time " , pt was given 1 x 2.5 Prolixin per NP Corene Cornea) . Pt see pacing the unit and responding to internal stimuli this evening. Pt has been very argumentative this evening with Probation officer.   A: Pt was offered support and encouragement. Pt was given scheduled medications. Pt was encourage to attend groups. Q 15 minute checks were done for safety.   R:Pt attends groups and interacts well with peers and staff. Pt is taking medication. Pt has no complaints.Pt receptive to treatment and safety maintained on unit.

## 2017-06-19 NOTE — Progress Notes (Signed)
[facility] MD Progress Note  06/19/2017 3:02 PM [name]  MRN:  [id number]  Subjective: Patient reports ongoing auditory hallucinations, poor sleep.  Denies suicidal ideations . Denies medication side effects .  Objective: I have discussed case with treatment team and have met with patient. Presents vaguely irritable, dysphoric, and reports ongoing auditory hallucinations . Does not present internally preoccupied. He does not appear internally preoccupied at this time, but report is that he does at times mumbles/speaks to himself and has exhibited disorganized behavior- he contacted 911 this AM, reporting he felt persecuted. Reports fair sleep. Denies medication side effects - he states Prolixin has been effective in the past, well tolerated . Does not present with EPS or akathisia.    Principal Problem: Schizoaffective disorder, bipolar type (HCC)  Diagnosis:   Patient Active Problem List   Diagnosis Date Noted  . Hallucinations [R44.3] 07/29/2014  . Hypokalemia [E87.6] 04/28/2014  . Drug overdose [T50.901A] 04/27/2014  . Schizoaffective disorder, bipolar type (HCC) [F25.0] 03/02/2014  . Anxiety state [F41.1] 03/02/2014  . Benzodiazepine dependence (HCC) [F13.20] 03/02/2014  . Chest pain [R07.9] 10/11/2013  . EKG abnormalities [R94.31] 10/11/2013  . Chronic back pain [M54.9, G89.29]    Total Time spent with patient: 20 minutes  Past Psychiatric History: Schizoaffective disorder, bipolar-type.  Past Medical History:  Past Medical History:  Diagnosis Date  . Asthma   . CHF (congestive heart failure) (HCC)   . Chronic back pain   . COPD (chronic obstructive pulmonary disease) (HCC)   . Knee pain, chronic   . Migraines   . Schizophrenia, schizo-affective (HCC)     Past Surgical History:  Procedure Laterality Date  . extraction of wisdom teeth    . KNEE SURGERY     four times   Family History: History reviewed. No pertinent family history.  Family Psychiatric  History:  See H&P.  Social History:  History  Alcohol Use No    Comment: occ     History  Drug Use No    Social History   Social History  . Marital status: Single    Spouse name: N/A  . Number of children: N/A  . Years of education: N/A   Social History Main Topics  . Smoking status: Current Every Day Smoker    Packs/day: 3.00    Years: 10.00    Types: Cigarettes    Last attempt to quit: 02/26/2014  . Smokeless tobacco: Never Used  . Alcohol use No     Comment: occ  . Drug use: No  . Sexual activity: No   Other Topics Concern  . None   Social History Narrative  . None   Additional Social History:   Sleep: Fair  Appetite:  "OK"  Current Medications: Current Facility-Administered Medications  Medication Dose Route Frequency Provider Last Rate Last Dose  . acetaminophen (TYLENOL) tablet 650 mg  650 mg Oral Q6H PRN Okonkwo, Justina A, NP   650 mg at 06/18/17 0910  . alum & mag hydroxide-simeth (MAALOX/MYLANTA) 200-200-20 MG/5ML suspension 30 mL  30 mL Oral Q4H PRN Okonkwo, Justina A, NP   30 mL at 06/18/17 0220  . amLODipine (NORVASC) tablet 5 mg  5 mg Oral Daily Burnard Leigh, MD   5 mg at 06/19/17 1191  . benztropine (COGENTIN) tablet 0.5 mg  0.5 mg Oral BID PRN Lashaun Krapf, Rockey Situ, MD      . fluPHENAZine (PROLIXIN) tablet 2.5 mg  2.5 mg Oral BID Sanjuana Kava, NP  2.5 mg at 06/19/17 0813  . hydrOXYzine (ATARAX/VISTARIL) tablet 25 mg  25 mg Oral Q6H PRN Ivet Guerrieri, Rockey Situ, MD   25 mg at 06/18/17 0220  . hydrOXYzine (ATARAX/VISTARIL) tablet 50 mg  50 mg Oral QHS Armandina Stammer I, NP   50 mg at 06/18/17 2123  . loperamide (IMODIUM) capsule 2-4 mg  2-4 mg Oral PRN Qunisha Bryk, Rockey Situ, MD      . LORazepam (ATIVAN) tablet 1 mg  1 mg Oral Q6H PRN Isamar Nazir, Rockey Situ, MD   1 mg at 06/19/17 1458  . magnesium hydroxide (MILK OF MAGNESIA) suspension 30 mL  30 mL Oral Daily PRN Okonkwo, Justina A, NP   30 mL at 06/17/17 1322  . multivitamin with minerals tablet 1 tablet  1 tablet Oral  Daily Aleem Elza, Rockey Situ, MD   1 tablet at 06/18/17 0900  . ondansetron (ZOFRAN-ODT) disintegrating tablet 4 mg  4 mg Oral Q6H PRN Elizabet Schweppe, Rockey Situ, MD   4 mg at 06/17/17 1322  . SUMAtriptan (IMITREX) tablet 25 mg  25 mg Oral Daily PRN Burnard Leigh, MD      . thiamine (VITAMIN B-1) tablet 100 mg  100 mg Oral Daily Clatie Kessen, Rockey Situ, MD   100 mg at 06/19/17 4098   Lab Results: No results found for this or any previous visit (from the past 48 hour(s)).  Blood Alcohol level:  Lab Results  Component Value Date   ETH <10 06/14/2017   ETH <5 02/15/2017   Metabolic Disorder Labs: No results found for: HGBA1C, MPG No results found for: PROLACTIN No results found for: CHOL, TRIG, HDL, CHOLHDL, VLDL, LDLCALC  Physical Findings: AIMS: Facial and Oral Movements Muscles of Facial Expression: None, normal Lips and Perioral Area: None, normal Jaw: None, normal Tongue: None, normal,Extremity Movements Upper (arms, wrists, hands, fingers): None, normal Lower (legs, knees, ankles, toes): None, normal, Trunk Movements Neck, shoulders, hips: None, normal, Overall Severity Severity of abnormal movements (highest score from questions above): None, normal Incapacitation due to abnormal movements: None, normal Patient's awareness of abnormal movements (rate only patient's report): No Awareness, Dental Status Current problems with teeth and/or dentures?: No Does patient usually wear dentures?: No  CIWA:  CIWA-Ar Total: 12 COWS:  COWS Total Score: 9  Musculoskeletal: Strength & Muscle Tone: within normal limits Gait & Station: normal Patient leans: N/A  Psychiatric Specialty Exam: Physical Exam  Nursing note and vitals reviewed.   Review of Systems  Psychiatric/Behavioral: Negative for depression, memory loss, substance abuse and suicidal ideas. The patient is nervous/anxious and has insomnia.   denies chest pain, no shortness of breath   Blood pressure 118/90, pulse 93, temperature  98.5 F (36.9 C), temperature source Oral, resp. rate 16, height 6\' 1"  (1.854 m), weight 99.3 kg (219 lb), SpO2 100 %.Body mass index is 28.89 kg/m.  General Appearance: Fairly Groomed  Eye Contact:  Fair  Speech:  variable   Volume:  decreased , mumbles under his breath at times   Mood: reports depression, presents dysphoric   Affect: states " not feeling well", presents vaguely irritable   Thought Process: mostly linear, does become somewhat tangential with open ended questions  Orientation:  alert , attentive    Thought Content: reports persistent auditory hallucinations , no delusions expressed at this time  Suicidal Thoughts:  No denies any suicidal ideations, denies any homicidal ideations   Homicidal Thoughts:  No  Memory:  recent and remote grossly intact  Judgement:  Fair  Insight:  Fair  Psychomotor Activity:  Decreased  Concentration:  Concentration: fair and Attention Span: fair   Recall:  Good  Fund of Knowledge:  fair   Language:  fair   Akathisia:  Negative  Handed:  Right  AIMS (if indicated):     Assets:  Communication Skills Desire for Improvement Resilience  ADL's:  fair   Cognition:  WNL  Sleep:  Number of Hours: 6     Assessment - patient reports ongoing psychotic symptoms- auditory hallucinations, and presents vaguely irritable, dysphoric. Denies suicidal ideations at this time. Tolerating Prolixin well- has reported history of good response and tolerance  to this medication in the past .  Treatment Plan Summary: Daily contact with patient to assess and evaluate symptoms and progress in treatment. Treatment plan reviewed as below today 10/12  -Encourage group and milieu participation to work on coping skills and symptom reduction -Continue Cogentin 0.5 mg prn for EPS. -Increase  fluphenazine (Prolixin) to 5 mgrs BID for psychosis and for mood disorder -Continue Hydroxyzine 25 mg prn for anxiety Q 6 hours and 50 mgrs Q HS PRN for insomnia  -Continue  Ativan 1 mg tablet Q 6 hours severe anxiety or  for CIWA >10. -Treatment team working on disposition planning   Craige Cotta, MD 06/19/2017, 3:02 PM   Patient ID: [name], male   DOB: 1971-02-10, 46 y.o.   MRN: [id number]

## 2017-06-19 NOTE — [facility] Group Notes (Signed)
LCSW Group Therapy Note   06/19/2017 1:15pm   Type of Therapy and Topic:  Group Therapy:  Positive Affirmations   Participation Level:  Active  Description of Group: This group addressed positive affirmation toward self and others. Patients went around the room and identified two positive things about themselves and two positive things about a peer in the room. Patients reflected on how it felt to share something positive with others, to identify positive things about themselves, and to hear positive things from others. Patients were encouraged to have a daily reflection of positive characteristics or circumstances.  Therapeutic Goals 1. Patient will verbalize two of their positive qualities 2. Patient will demonstrate empathy for others by stating two positive qualities about a peer in the group 3. Patient will verbalize their feelings when voicing positive self affirmations and when voicing positive affirmations of others 4. Patients will discuss the potential positive impact on their wellness/recovery of focusing on positive traits of self and others. Summary of Patient Progress:  [name] attended group and was there the entire time.  He was an active participant.  Then theme today was [facility].  For him hope is the people who help him and sometimes practicing his faith.  When describing the picture he chose he states that hope is knowing that behind the clouds there is still a beautiful sky.  He feels at peace when things are silent and when he does not hear the voices disturbing his sleep.   Therapeutic Modalities Cognitive Behavioral Therapy Motivational Interviewing  Riley Lam Work 06/19/2017 1:27 PM

## 2017-06-20 DIAGNOSIS — F29 Unspecified psychosis not due to a substance or known physiological condition: Secondary | ICD-10-CM

## 2017-06-20 LAB — BASIC METABOLIC PANEL
Anion gap: 12 (ref 5–15)
BUN: 11 mg/dL (ref 6–20)
CHLORIDE: 107 mmol/L (ref 101–111)
CO2: 21 mmol/L — AB (ref 22–32)
Calcium: 9.8 mg/dL (ref 8.9–10.3)
Creatinine, Ser: 0.95 mg/dL (ref 0.61–1.24)
GFR calc Af Amer: >60 mL/min (ref >60)
GFR calc non Af Amer: >60 mL/min (ref >60)
GLUCOSE: 119 mg/dL — AB (ref 65–99)
POTASSIUM: 3.5 mmol/L (ref 3.5–5.1)
SODIUM: 140 mmol/L (ref 135–145)

## 2017-06-20 MED ORDER — LORAZEPAM 1 MG PO TABS
ORAL_TABLET | ORAL | Status: AC
  Filled 2017-06-20: qty 2

## 2017-06-20 MED ORDER — OLANZAPINE 10 MG PO TBDP
10.0000 mg | ORAL_TABLET | Freq: Once | ORAL | Status: AC
  Administered 2017-06-20: 10 mg via ORAL
  Filled 2017-06-20 (×2): qty 1

## 2017-06-20 MED ORDER — LORAZEPAM 1 MG PO TABS
1.0000 mg | ORAL_TABLET | Freq: Four times a day (QID) | ORAL | Status: DC | PRN
  Administered 2017-06-20 – 2017-06-21 (×2): 1 mg via ORAL
  Filled 2017-06-20 (×2): qty 1

## 2017-06-20 MED ORDER — LORAZEPAM 1 MG PO TABS
2.0000 mg | ORAL_TABLET | Freq: Once | ORAL | Status: AC
  Administered 2017-06-20: 2 mg via ORAL

## 2017-06-20 MED ORDER — PALIPERIDONE ER 3 MG PO TB24
3.0000 mg | ORAL_TABLET | Freq: Two times a day (BID) | ORAL | Status: DC
  Administered 2017-06-20 (×2): 3 mg via ORAL
  Filled 2017-06-20 (×5): qty 1

## 2017-06-20 MED ORDER — HYDROXYZINE HCL 25 MG PO TABS
25.0000 mg | ORAL_TABLET | Freq: Four times a day (QID) | ORAL | Status: DC | PRN
  Administered 2017-06-21: 25 mg via ORAL
  Filled 2017-06-20: qty 1

## 2017-06-20 NOTE — Progress Notes (Signed)
Patient ID: [name], male   DOB: 11/21/1970, 46 y.o.   MRN: [id number]    D: Pt continues to be very irritable, agitated, anxious, and restless. Pt continues to respond to internal stimuli, and continues to require redirection. Pt was given medication, however remained very restless. Tanika NP made aware, new medication orders noted. Pt reported that he did not feel right and that he only slept two hours last night, and that he needed help. A: 1:1 close obs continued for patient safety. R: Pts safety maintained.

## 2017-06-20 NOTE — [facility] Group Notes (Signed)
[place] Group Notes: (Clinical Social Work)   06/20/2017      Type of Therapy:  Group Therapy   Participation Level:  Did Not Attend despite MHT prompting   Selmer Dominion, LCSW 06/20/2017, 12:43 PM

## 2017-06-20 NOTE — Progress Notes (Signed)
Pt coming out his room constantly, " can't ya'll give me a shot of something to make me sleep, some Geodon or something"

## 2017-06-20 NOTE — Progress Notes (Signed)
Pt was constantly reminded that he has allergies to certain antipsychotics, including Geodon. Pt was encouraged to talk with the doctor in the morning to see if they could agree on something that can help with his voices and something to help him sleep .

## 2017-06-20 NOTE — Progress Notes (Signed)
Pt requested taking Prolixin at night and was wanting more , because he was still hearing voices

## 2017-06-20 NOTE — Progress Notes (Signed)
[facility] MD Progress Note  06/20/2017 1:33 PM [name]  MRN:  [id number]  Subjective: Patient reports " I am sorry for punching the walls but Trump was after me"  Objective: Patient seen resting in bed. Patient on 1:1 for safety. Patient continues to reports auditory hallucinations. States he doesn't feel as if the medication is helping. States taken medications a prescribed. Per nursing staff, patient has requested to be restarted on Geodon which has helped in the past. Noted in chart patient has allergies to Geodon (unspecified allergy)  Discussed starting Invega. Reports a fair appetite and states he is resting well.Support, Encouragement and Reassurances was provided.  Principal Problem: Schizoaffective disorder, bipolar type ([place])  Diagnosis:   Patient Active Problem List   Diagnosis Date Noted  . Hallucinations [R44.3] 07/29/2014  . Hypokalemia [E87.6] 04/28/2014  . Drug overdose [T50.901A] 04/27/2014  . Schizoaffective disorder, bipolar type ([place]) [F25.0] 03/02/2014  . Anxiety state [F41.1] 03/02/2014  . Benzodiazepine dependence ([place]) [F13.20] 03/02/2014  . Chest pain [R07.9] 10/11/2013  . EKG abnormalities [R94.31] 10/11/2013  . Chronic back pain [M54.9, G89.29]    Total Time spent with patient: 20 minutes  Past Psychiatric History: Schizoaffective disorder, bipolar-type.  Past Medical History:  Past Medical History:  Diagnosis Date  . Asthma   . CHF (congestive heart failure) ([place])   . Chronic back pain   . COPD (chronic obstructive pulmonary disease) ([place])   . Knee pain, chronic   . Migraines   . Schizophrenia, schizo-affective ([place])     Past Surgical History:  Procedure Laterality Date  . extraction of wisdom teeth    . KNEE SURGERY     four times   Family History: History reviewed. No pertinent family history.  Family Psychiatric  History: See H&P.  Social History:  History  Alcohol Use No    Comment: occ     History  Drug Use No    Social History    Social History  . Marital status: Single    Spouse name: N/A  . Number of children: N/A  . Years of education: N/A   Social History Main Topics  . Smoking status: Current Every Day Smoker    Packs/day: 3.00    Years: 10.00    Types: Cigarettes    Last attempt to quit: 02/26/2014  . Smokeless tobacco: Never Used  . Alcohol use No     Comment: occ  . Drug use: No  . Sexual activity: No   Other Topics Concern  . None   Social History Narrative  . None   Additional Social History:   Sleep: Fair  Appetite:  "OK"  Current Medications: Current Facility-Administered Medications  Medication Dose Route Frequency Provider Last Rate Last Dose  . acetaminophen (TYLENOL) tablet 650 mg  650 mg Oral Q6H PRN Okonkwo, Justina A, NP   650 mg at 06/20/17 0637  . alum & mag hydroxide-simeth (MAALOX/MYLANTA) 200-200-20 MG/5ML suspension 30 mL  30 mL Oral Q4H PRN Okonkwo, Justina A, NP   30 mL at 06/18/17 0220  . amLODipine (NORVASC) tablet 5 mg  5 mg Oral Daily Aundra Dubin, MD   5 mg at 06/20/17 0839  . benztropine (COGENTIN) tablet 0.5 mg  0.5 mg Oral BID PRN Cobos, Myer Peer, MD      . hydrOXYzine (ATARAX/VISTARIL) tablet 50 mg  50 mg Oral QHS Lindell Spar I, NP   50 mg at 06/19/17 2133  . magnesium hydroxide (MILK OF MAGNESIA) suspension 30 mL  30 mL Oral Daily PRN Okonkwo, Justina A, NP   30 mL at 06/17/17 1322  . multivitamin with minerals tablet 1 tablet  1 tablet Oral Daily Cobos, Myer Peer, MD   1 tablet at 06/18/17 0900  . paliperidone (INVEGA) 24 hr tablet 3 mg  3 mg Oral BID Derrill Center, NP   3 mg at 06/20/17 1105  . potassium chloride (K-DUR,KLOR-CON) CR tablet 10 mEq  10 mEq Oral BID Cobos, Myer Peer, MD      . SUMAtriptan (IMITREX) tablet 25 mg  25 mg Oral Daily PRN Aundra Dubin, MD      . thiamine (VITAMIN B-1) tablet 100 mg  100 mg Oral Daily Cobos, Myer Peer, MD   100 mg at 06/19/17 5176   Lab Results:  Results for orders placed or performed  during the hospital encounter of 06/16/17 (from the past 48 hour(s))  Glucose, capillary     Status: Abnormal   Collection Time: 06/19/17  8:29 PM  Result Value Ref Range   Glucose-Capillary 118 (H) 65 - 99 mg/dL  Basic metabolic panel     Status: Abnormal   Collection Time: 06/20/17  6:10 AM  Result Value Ref Range   Sodium 140 135 - 145 mmol/L   Potassium 3.5 3.5 - 5.1 mmol/L   Chloride 107 101 - 111 mmol/L   CO2 21 (L) 22 - 32 mmol/L   Glucose, Bld 119 (H) 65 - 99 mg/dL   BUN 11 6 - 20 mg/dL   Creatinine, Ser 0.95 0.61 - 1.24 mg/dL   Calcium 9.8 8.9 - 10.3 mg/dL   GFR calc non Af Amer >60 >60 mL/min   GFR calc Af Amer >60 >60 mL/min    Comment: (NOTE) The eGFR has been calculated using the CKD EPI equation. This calculation has not been validated in all clinical situations. eGFR's persistently <60 mL/min signify possible Chronic Kidney Disease.    Anion gap 12 5 - 15    Comment: Performed at [facility], [place] [address], [place], [place] [address]    Blood Alcohol level:  Lab Results  Component Value Date   ETH <10 06/14/2017   ETH <5 71/02/2693   Metabolic Disorder Labs: No results found for: HGBA1C, MPG No results found for: PROLACTIN No results found for: CHOL, TRIG, HDL, CHOLHDL, VLDL, LDLCALC  Physical Findings: AIMS: Facial and Oral Movements Muscles of Facial Expression: None, normal Lips and Perioral Area: None, normal Jaw: None, normal Tongue: None, normal,Extremity Movements Upper (arms, wrists, hands, fingers): None, normal Lower (legs, knees, ankles, toes): None, normal, Trunk Movements Neck, shoulders, hips: None, normal, Overall Severity Severity of abnormal movements (highest score from questions above): None, normal Incapacitation due to abnormal movements: None, normal Patient's awareness of abnormal movements (rate only patient's report): No Awareness, Dental Status Current problems with teeth and/or dentures?: No Does  patient usually wear dentures?: No  CIWA:  CIWA-Ar Total: 10 COWS:  COWS Total Score: 9  Musculoskeletal: Strength & Muscle Tone: within normal limits Gait & Station: normal Patient leans: N/A  Psychiatric Specialty Exam: Physical Exam  Nursing note and vitals reviewed. Constitutional: He appears well-developed.  Cardiovascular: Normal rate.     Review of Systems  Psychiatric/Behavioral: Negative for depression, memory loss, substance abuse and suicidal ideas. The patient is nervous/anxious and has insomnia.     Blood pressure 116/82, pulse (!) 106, temperature 98.2 F (36.8 C), resp. rate 20, height 6' 1"  (1.854 m), weight 99.3 kg (  219 lb), SpO2 100 %.Body mass index is 28.89 kg/m.  General Appearance: Fairly Groomed  Eye Contact:  Fair  Speech:  variable   Volume:  decreased , mumbles   Mood: reports depression, presents dysphoric   Affect:  Reports mood is improving seen with slight irritability    Thought Process: mostly linear, does become somewhat tangential with open ended questions  Orientation:  alert , attentive    Thought Content: reports persistent auditory hallucinations , no delusions expressed at this time  Suicidal Thoughts:  No denies any suicidal ideations, denies any homicidal ideations   Homicidal Thoughts:  No  Memory:  recent and remote grossly intact  Judgement:  Fair  Insight:  Fair  Psychomotor Activity:  Decreased  Concentration:  Concentration: fair and Attention Span: fair   Recall:  Good  Fund of Knowledge:  fair   Language:  fair   Akathisia:  Negative  Handed:  Right  AIMS (if indicated):     Assets:  Communication Skills Desire for Improvement Resilience  ADL's:  fair   Cognition:  WNL  Sleep:  Number of Hours: 6       I agree with current treatment plan on 06/20/2017, Patient seen face-to-face for psychiatric evaluation follow-up, chart reviewed and case discussed. . Reviewed the information documented and agree with the treatment  plan.   Treatment Plan Summary: Daily contact with patient to assess and evaluate symptoms and progress in treatment.  Treatment plan reviewed as below today 10/13  -Encourage group and milieu participation to work on coping skills and symptom reduction -Continue Cogentin 0.5 mg prn for EPS. Initiate Invega 24m PO BID for mood stablilization -Discontinued  fluphenazine (Prolixin) to 5 mgrs BID for psychosis and for mood disorder -Continue Hydroxyzine 25 mg prn for anxiety Q 6 hours and 50 mgrs Q HS PRN for insomnia  -Continue Ativan 1 mg tablet Q 6 hours severe anxiety or  for CIWA >10. -Treatment team working on disposition planning   TDerrill Center NP 06/20/2017, 1:33 PM

## 2017-06-20 NOTE — Progress Notes (Signed)
Nursing Close Observation note Pt was placed on close observation due to pt destruction of property, pt responding to internal stimuli and needing redirection to remain under control.  D:Pt observed pacing the hall. RR even and unlabored. No distress noted. A: Close observation continues for safety  R: pt remains safe

## 2017-06-20 NOTE — Progress Notes (Signed)
[facility] Group Notes:  (Nursing/MHT/Case Management/Adjunct)  Date:  06/20/2017  Time:  11:51 PM  Type of Therapy:  Psychoeducational Skills  Participation Level:  Active  Participation Quality:  Attentive  Affect:  Blunted  Cognitive:  Appropriate  Insight:  Improving  Engagement in Group:  Developing/Improving  Modes of Intervention:  Education  Summary of Progress/Problems: Patient verbalized in group that he slept all day due to the medication. He admits to feeling anxious and hearing voices. He admits that he had a bad night last evening in which he hit the wall in his room and tore the security camera off the ceiling. He states that he has difficulty sleeping at night and then becomes aggressive and violent. As for the theme of the day, his coping skill will be to get more rest.   Jyasia Markoff S 06/20/2017, 11:51 PM

## 2017-06-20 NOTE — Progress Notes (Signed)
Patient ID: [name], male   DOB: [birth year], 46 y.o.   MRN: [id number]    D: Pt continues to be very irritable, agitated, anxious, and restless. Pt continues to respond to internal stimuli, and continues to require redirection. A: 1:1 close obs continued for patient safety. R: Pts safety maintained.

## 2017-06-20 NOTE — Progress Notes (Signed)
[facility] Group Notes:  (Nursing/MHT/Case Management/Adjunct)  Date:  06/20/2017  Time:  4:09 AM  Type of Therapy:  Psychoeducational Skills  Participation Level:  Active  Participation Quality:  Attentive  Affect:  Appropriate  Cognitive:  Appropriate  Insight:  Appropriate  Engagement in Group:  Developing/Improving  Modes of Intervention:  Education  Summary of Progress/Problems: The patient shared in group last evening that he had a good day since he had a good family visit with his mother. He did however verbalize that the voices were very intense today. His goal for tomorrow is to "get the voices under control".   Adriahna Shearman S 06/20/2017, 4:09 AM

## 2017-06-20 NOTE — Progress Notes (Signed)
Patient ID: [name], male   DOB: 08/12/1971, 46 y.o.   MRN: [id number]    D: Pt continues to be very irritable and restless on the unit. Pt was given evening medication, he took medication without any problems. Pt reported that his depression was a 0, his hopelessness was a 5, and his anxiety was a 0. Pt reported that his goal for today was to kill or die. Pts thoughts remain very disorganized. No physical aggression noted. A: 1:1 close obs continued for patient safety. R: Pts safety maintained.

## 2017-06-20 NOTE — Progress Notes (Signed)
Patient punched holes in wall with right hand. On exam of right hand: denies any numbness or tingling, noted some mild edema of the third MCP, reports some mild tenderness of the same joint upon palpation, no limitation of movement noted. Nursing staff to monitor and apply ice pack.

## 2017-06-20 NOTE — Progress Notes (Signed)
Pt continues to yell out at times, and need redirection.

## 2017-06-20 NOTE — Progress Notes (Signed)
Pt got up out the bed and pulled the camera from the ceiling, pt stated he believed  Rod was watching him. Pt needs redirection from time to time to control hostility. Pt was offered ice for his had again and pt accepted this time.

## 2017-06-20 NOTE — Plan of Care (Signed)
Problem: Safety: Goal: Periods of time without injury will increase Outcome: Progressing Pt safe on the unit  Problem: Coping: Goal: Ability to cope will improve Outcome: Not Progressing Pt endorsing AVH, pt stated he wanted Benzodiazepines to help with his voices

## 2017-06-20 NOTE — Progress Notes (Signed)
The patient has been observed talking to himself in his bedroom. Several times per hour, the patient walks down the hallway covered in his blanket. Recently, the patient walked up to the hallway door and tried to open it. He has also been observed fiddling with the panel on the wall in his bedroom (lights, emergency button).

## 2017-06-20 NOTE — Progress Notes (Signed)
Pt observed throughout the night for pt safety.

## 2017-06-20 NOTE — Progress Notes (Signed)
Pt up in room punching the wall , pt put holes in the wall above his bed, pt stated the voices were getting to him, "[name] trying to get me" . Pt was given 2 mg Ativan, verbal encouragement , cup of water and will continue to monitor

## 2017-06-20 NOTE — Progress Notes (Signed)
Pt pacing up in the dayroom, walking the hall " I need something to help me sleep, calm me down"

## 2017-06-20 NOTE — Progress Notes (Signed)
D: Pt denies SI/HI/, +ve AVH. Pt is irritable, very argumentative. Pt asked " why are they giving me my tranquilizer  In the day and not at night" . Pt complained of being anxious, pt stated " I'm not allergic to Geodon", pt was asked why it would be in his chart and he said he didn't know.   A: Pt was offered support and encouragement. Pt was given scheduled medications. Pt was encourage to attend groups. Q 15 minute checks were done for safety.   R:safety maintained on unit.

## 2017-06-20 NOTE — Progress Notes (Signed)
Pt came up asking could he sit in the day room, pt stated the voices in his room are terrorizing him, "I just need a soft cushiony chair to crash in"

## 2017-06-21 LAB — URINALYSIS, ROUTINE W REFLEX MICROSCOPIC
Bilirubin Urine: NEGATIVE
GLUCOSE, UA: NEGATIVE mg/dL
HGB URINE DIPSTICK: NEGATIVE
Ketones, ur: NEGATIVE mg/dL
Leukocytes, UA: NEGATIVE
Nitrite: NEGATIVE
PROTEIN: NEGATIVE mg/dL
Specific Gravity, Urine: 1.004 — ABNORMAL LOW (ref 1.005–1.030)
pH: 5 (ref 5.0–8.0)

## 2017-06-21 MED ORDER — METHOCARBAMOL 500 MG PO TABS
500.0000 mg | ORAL_TABLET | Freq: Three times a day (TID) | ORAL | Status: AC | PRN
  Administered 2017-06-21 – 2017-06-24 (×2): 500 mg via ORAL
  Filled 2017-06-21 (×4): qty 1

## 2017-06-21 MED ORDER — LORAZEPAM 1 MG PO TABS: 1.0000 mg | ORAL_TABLET | Freq: Every day | ORAL | Status: DC

## 2017-06-21 MED ORDER — LORAZEPAM 1 MG PO TABS: 1.0000 mg | ORAL_TABLET | Freq: Three times a day (TID) | ORAL | Status: DC

## 2017-06-21 MED ORDER — LORAZEPAM 1 MG PO TABS: 1.0000 mg | ORAL_TABLET | Freq: Two times a day (BID) | ORAL | Status: DC

## 2017-06-21 MED ORDER — PALIPERIDONE ER 3 MG PO TB24
9.0000 mg | ORAL_TABLET | Freq: Every day | ORAL | Status: DC
  Filled 2017-06-21: qty 3

## 2017-06-21 MED ORDER — ADULT MULTIVITAMIN W/MINERALS CH: 1.0000 | ORAL_TABLET | Freq: Every day | ORAL | Status: DC

## 2017-06-21 MED ORDER — LORAZEPAM 1 MG PO TABS
2.0000 mg | ORAL_TABLET | Freq: Once | ORAL | Status: AC
  Administered 2017-06-21: 2 mg via ORAL
  Filled 2017-06-21: qty 2

## 2017-06-21 MED ORDER — CLONIDINE HCL 0.1 MG PO TABS
0.1000 mg | ORAL_TABLET | ORAL | Status: DC
  Filled 2017-06-21 (×3): qty 1

## 2017-06-21 MED ORDER — PALIPERIDONE ER 6 MG PO TB24
9.0000 mg | ORAL_TABLET | Freq: Every day | ORAL | Status: DC
  Administered 2017-06-21 – 2017-06-25 (×5): 9 mg via ORAL
  Filled 2017-06-21 (×2): qty 1
  Filled 2017-06-21: qty 3
  Filled 2017-06-21: qty 1
  Filled 2017-06-21: qty 3
  Filled 2017-06-21: qty 1
  Filled 2017-06-21: qty 3
  Filled 2017-06-21: qty 1
  Filled 2017-06-21: qty 3

## 2017-06-21 MED ORDER — CLONIDINE HCL 0.1 MG PO TABS
0.1000 mg | ORAL_TABLET | Freq: Four times a day (QID) | ORAL | Status: DC
  Administered 2017-06-21 – 2017-06-22 (×4): 0.1 mg via ORAL
  Filled 2017-06-21 (×8): qty 1

## 2017-06-21 MED ORDER — LORAZEPAM 1 MG PO TABS: 1.0000 mg | ORAL_TABLET | Freq: Four times a day (QID) | ORAL | Status: DC

## 2017-06-21 MED ORDER — PALIPERIDONE ER 6 MG PO TB24: 6.0000 mg | ORAL_TABLET | Freq: Every day | ORAL | Status: DC

## 2017-06-21 MED ORDER — CLONIDINE HCL 0.1 MG PO TABS: 0.1000 mg | ORAL_TABLET | Freq: Every day | ORAL | Status: DC

## 2017-06-21 MED ORDER — LOPERAMIDE HCL 2 MG PO CAPS: 2.0000 mg | ORAL_CAPSULE | ORAL | Status: DC | PRN

## 2017-06-21 MED ORDER — HYDROCODONE-ACETAMINOPHEN 5-325 MG PO TABS
1.0000 | ORAL_TABLET | Freq: Two times a day (BID) | ORAL | Status: DC
  Administered 2017-06-21: 1 via ORAL
  Filled 2017-06-21: qty 1

## 2017-06-21 MED ORDER — DICYCLOMINE HCL 20 MG PO TABS: 20.0000 mg | ORAL_TABLET | Freq: Four times a day (QID) | ORAL | Status: DC | PRN

## 2017-06-21 MED ORDER — HYDROXYZINE HCL 25 MG PO TABS: 25.0000 mg | ORAL_TABLET | Freq: Four times a day (QID) | ORAL | Status: DC | PRN

## 2017-06-21 MED ORDER — ONDANSETRON 4 MG PO TBDP: 4.0000 mg | ORAL_TABLET | Freq: Four times a day (QID) | ORAL | Status: DC | PRN

## 2017-06-21 MED ORDER — SULFAMETHOXAZOLE-TRIMETHOPRIM 800-160 MG PO TABS
1.0000 | ORAL_TABLET | Freq: Two times a day (BID) | ORAL | Status: AC
  Administered 2017-06-21 – 2017-06-23 (×6): 1 via ORAL
  Filled 2017-06-21 (×6): qty 1

## 2017-06-21 MED ORDER — LORAZEPAM 1 MG PO TABS
1.0000 mg | ORAL_TABLET | Freq: Four times a day (QID) | ORAL | Status: DC | PRN
  Administered 2017-06-21 – 2017-06-22 (×2): 1 mg via ORAL
  Filled 2017-06-21 (×2): qty 1

## 2017-06-21 MED ORDER — THIAMINE HCL 100 MG/ML IJ SOLN: 100.0000 mg | Freq: Once | INTRAMUSCULAR | Status: DC

## 2017-06-21 MED ORDER — VITAMIN B-1 100 MG PO TABS
100.0000 mg | ORAL_TABLET | Freq: Every day | ORAL | Status: DC
  Filled 2017-06-21 (×7): qty 1

## 2017-06-21 NOTE — Progress Notes (Signed)
Pt slept 1 hour during the night.

## 2017-06-21 NOTE — Progress Notes (Signed)
Close Observation Note 1545  Patient continues to sleep in his bed.  Respirations even and unlabored.  No signs/symptoms of pain/distress noted on patient's face/body movements.  Safety maintained with close observation per MD order.

## 2017-06-21 NOTE — Progress Notes (Signed)
Close Observation Note: 1115  Patient stated he had "no" pain, then he changed and said "maybe a 2".  Stated his anxiety has decreased.  Patient left group to go to bathroom and then returned to group with close observation with him per MD order.  Respirations even and unlabored.  No signs/symptoms of pain/distress. Close observation continues per MD order.

## 2017-06-21 NOTE — [facility] Group Notes (Signed)
[facility] LCSW Group Therapy Note  Date/Time:  06/21/2017  11:00AM-12:00PM  Type of Therapy and Topic:  Group Therapy:  Music and Mood  Participation Level:  Active   Description of Group: In this process group, members listened to a variety of genres of music and identified that different types of music evoke different responses.  Patients were encouraged to identify music that was soothing for them and music that was energizing for them.  Patients discussed how this knowledge can help with wellness and recovery in various ways including managing depression and anxiety as well as encouraging healthy sleep habits.    Therapeutic Goals: 1. Patients will explore the impact of different varieties of music on mood 2. Patients will verbalize the thoughts they have when listening to different types of music 3. Patients will identify music that is soothing to them as well as music that is energizing to them 4. Patients will discuss how to use this knowledge to assist in maintaining wellness and recovery 5. Patients will explore the use of music as a coping skill  Summary of Patient Progress:  At the beginning of group, patient expressed feeling happy and at the end of group stated he felt "encouraged."  He did participate well throughout group although he was in and out of the room with his 1:1 observer numerous times.  Therapeutic Modalities: Solution Focused Brief Therapy Motivational Interviewing Activity   Selmer Dominion, LCSW 06/21/2017 8:28 AM

## 2017-06-21 NOTE — Progress Notes (Signed)
Pt up to the nursing station complaining of hearing voices, pt irritable and argumentative, "I want to speak to a doctor right now, ya'll gave me medicine to sleep all day and now I'm up all night, who FUCKING does that"

## 2017-06-21 NOTE — Progress Notes (Signed)
Pt stated he wanted to take his Invega at night and would like something else during the day

## 2017-06-21 NOTE — Progress Notes (Signed)
Pt continues to present to the nursing station repeatedly with various request. If pt does not get what he wants he complains then asks for something else , similar to med seeking manipulative  Behaviors .

## 2017-06-21 NOTE — Progress Notes (Signed)
Close Observation Note 1300  Patient stated he ate all his lunch.  Patient denied SI and HI.  Denied visual hallucinations.  Stated he does hear voices sometimes.  Respirations even and unlabored.  No signs/symptoms of pain/distress noted on patient's face/body movements.  Patient stated he is sleepy, did not sleep last night.  Close observation continues per MD order for safety.

## 2017-06-21 NOTE — Progress Notes (Signed)
Close Observation Note 1000  Vicodin given for pain, body aches per MD order.  Patient continues to walk hallway or sit in dayroom with Close Observation present.

## 2017-06-21 NOTE — Progress Notes (Signed)
Pt pacing the hall , yelling in his room "I need Ativan"

## 2017-06-21 NOTE — Progress Notes (Signed)
D: The patient became upset during visiting this evening. The patient demanded that his male visitor bring him his Medicare card and cancel the plan so that he would have no medical coverage. He also demanded that his visitor give him the money which he states is in his locker.   A: The male visitor redirected the patient for being so demanding and for cursing. The male visitor attempted to de-escalate the patient as well.  R: The two visitors left abruptly and prior to them leaving, the patient claimed that he was hearing voices and that they were getting stronger.

## 2017-06-21 NOTE — Progress Notes (Signed)
D:  Patient's self inventory sheet, patient slept 1.5 hours.  Sleep medication not helpful.  Good appetite, normal energy level, poor concentration.  Denied depression and hopeless, anxiety #5.  Denied withdrawals.  Denied SI.  Physical problems, broke R hand.  Has experienced rash, blurred vision, hunger.  Physical pain, worst pain #5, no pain medication.   Goal is medication management, discharge, going to all groups, keep all scheduled meeting.  Plans to attend all groups.  Hears voices.  No discharge plans. A:  Medications administered per MD orders.  Emotional support and encouragement given patient. R:  Denied SI and HI. Does hear voices and sees snakes and turtle heads at times. Safety maintained with close observation per MD orders.

## 2017-06-21 NOTE — Progress Notes (Signed)
Close Observation Note 0800  Patient denied SI and HI this morning while talking to nurse,.  Stated he does see snakes or turtle heads at times, yellow dots this morning also.   Does hear voices of family members at times.  Refused invega 3 mg this morning.  Stated he wants to take invega at night because he slept yesterday.  Also refused vistaril prn for anxiety.  Respirations even and unlabored.  No signs/symptoms of distress noted on patient's face/body movements.  Close Observation continues per MD order.

## 2017-06-21 NOTE — Progress Notes (Signed)
Pt continues to come up to the nursing station asking for something to help him sleep, pt sounds a little lethargic, but continues to get up requesting medications, pt presents as if he were seeking just medications.

## 2017-06-21 NOTE — Progress Notes (Signed)
Pt up pacing the hall stating he is doing that because his back hurts so much

## 2017-06-21 NOTE — Plan of Care (Signed)
Problem: Safety: Goal: Ability to redirect hostility and anger into socially appropriate behaviors will improve Outcome: Not Progressing Pt continues to act out when he does not get his way

## 2017-06-21 NOTE — Progress Notes (Signed)
D: Pt +ve AH/ VH. Pt is demanding , somatic and don't want to talk about nothing but when he is getting his next medication. Pt refused his Clonidine stating he did not need it , Probation officer tried to educate pt but pt did not want to listen.  A: Pt was offered support and encouragement. Pt was given scheduled medications. Pt was encourage to attend groups. Q 15 minute checks were done for safety.   R: safety maintained on unit.

## 2017-06-21 NOTE — Progress Notes (Signed)
Nursing 1:1 note D:Pt observed lying in bed , then pt will jump up irritable requesting medications. Pt was informed he slept over 11 hours during the day. RR even and unlabored. No distress noted. A: 1:1 observation continues for safety  R: pt remains safe

## 2017-06-21 NOTE — Progress Notes (Signed)
Per check sheet pt slept over 11 hours during the day

## 2017-06-21 NOTE — Progress Notes (Signed)
Close Observation Note 0900  Patient talked to NP.   Ativan 2 mg given and tylenol given for knee pain.  Patient continues to walk hallway and sit in dayroom while MHT for close observation.  Respirations even and unlabored.  No signs/symptoms of pain/distress noted on patient's face/body movements.  Close observation continues per MD order.

## 2017-06-21 NOTE — Progress Notes (Signed)
Nursing 1:1 note D:Pt pacing the unit , lying down in the bed getting up requesting medications. RR even and unlabored. No distress noted.  A: 1:1 observation continues for safety  R: pt remains safe

## 2017-06-21 NOTE — Progress Notes (Signed)
[facility] MD Progress Note  06/21/2017 10:25 AM [name]  MRN:  [id number] Subjective:  Patient reports " my skin is crawling , I feel like I am detoxing"  [place] is awake, alert. Patient placed on 1:1 for safety. [place] Controlled Substance verified. Patient prdx tramadol and Norco 7.29m -325 mg for the past year. Patient has multiple Ativan PO and IM for anxiety and agitation. Patient reports the ILorayne Benderis causing him to be to sleepy through the day. Staff reports patient was unable to sleep at night. Invega was adjusted to QHS and restarted Norco at 51m325mg. Patient continues to reports auditory hallucinations and reports the voices are getting louder and louder. Support, encouragement and reassurance was provided.   Principal Problem: Schizoaffective disorder, bipolar type (HCBreedsvilleDiagnosis:   Patient Active Problem List   Diagnosis Date Noted  . Hallucinations [R44.3] 07/29/2014  . Hypokalemia [E87.6] 04/28/2014  . Drug overdose [T50.901A] 04/27/2014  . Schizoaffective disorder, bipolar type (HCProv[place][F25.0] 03/02/2014  . Anxiety state [F41.1] 03/02/2014  . Benzodiazepine dependence (HCSalinas[F13.20] 03/02/2014  . Chest pain [R07.9] 10/11/2013  . EKG abnormalities [R94.31] 10/11/2013  . Chronic back pain [M54.9, G89.29]    Total Time spent with patient: 30 minutes  Past Psychiatric History:   Past Medical History:  Past Medical History:  Diagnosis Date  . Asthma   . CHF (congestive heart failure) (H[facility]  . Chronic back pain   . COPD (chronic obstructive pulmonary disease) (HC[place]  . Knee pain, chronic   . Migraines   . Schizophrenia, schizo-affective (HCMountain [place]    Past Surgical History:  Procedure Laterality Date  . extraction of wisdom teeth    . KNEE SURGERY     four times   Family History: History reviewed. No pertinent family history. Family Psychiatric  History:  Social History:  History  Alcohol Use No    Comment: occ     History  Drug Use No    Social  History   Social History  . Marital status: Single    Spouse name: N/A  . Number of children: N/A  . Years of education: N/A   Social History Main Topics  . Smoking status: Current Every Day Smoker    Packs/day: 3.00    Years: 10.00    Types: Cigarettes    Last attempt to quit: 02/26/2014  . Smokeless tobacco: Never Used  . Alcohol use No     Comment: occ  . Drug use: No  . Sexual activity: No   Other Topics Concern  . None   Social History Narrative  . None   Additional Social History:                         Sleep: Poor  Appetite:  Fair  Current Medications: Current Facility-Administered Medications  Medication Dose Route Frequency Provider Last Rate Last Dose  . acetaminophen (TYLENOL) tablet 650 mg  650 mg Oral Q6H PRN Okonkwo, Justina A, NP   650 mg at 06/21/17 0812  . alum & mag hydroxide-simeth (MAALOX/MYLANTA) 200-200-20 MG/5ML suspension 30 mL  30 mL Oral Q4H PRN Okonkwo, Justina A, NP   30 mL at 06/18/17 0220  . amLODipine (NORVASC) tablet 5 mg  5 mg Oral Daily EkAundra DubinMD   5 mg at 06/21/17 0750  . benztropine (COGENTIN) tablet 0.5 mg  0.5 mg Oral BID PRN Cobos, FeMyer PeerMD      .  HYDROcodone-acetaminophen (NORCO/VICODIN) 5-325 MG per tablet 1 tablet  1 tablet Oral BID Derrill Center, NP   1 tablet at 06/21/17 0924  . hydrOXYzine (ATARAX/VISTARIL) tablet 25 mg  25 mg Oral Q6H PRN Derrill Center, NP   25 mg at 06/21/17 0254  . hydrOXYzine (ATARAX/VISTARIL) tablet 50 mg  50 mg Oral QHS Lindell Spar I, NP   50 mg at 06/20/17 2029  . LORazepam (ATIVAN) tablet 1 mg  1 mg Oral Q6H PRN Derrill Center, NP   1 mg at 06/21/17 0254  . magnesium hydroxide (MILK OF MAGNESIA) suspension 30 mL  30 mL Oral Daily PRN Okonkwo, Justina A, NP   30 mL at 06/17/17 1322  . multivitamin with minerals tablet 1 tablet  1 tablet Oral Daily Cobos, Myer Peer, MD   1 tablet at 06/18/17 0900  . [START ON 06/22/2017] paliperidone (INVEGA) 24 hr tablet 6 mg  6 mg  Oral QHS Derrill Center, NP      . SUMAtriptan (IMITREX) tablet 25 mg  25 mg Oral Daily PRN Aundra Dubin, MD      . thiamine (VITAMIN B-1) tablet 100 mg  100 mg Oral Daily Cobos, Myer Peer, MD   100 mg at 06/19/17 3220    Lab Results:  Results for orders placed or performed during the hospital encounter of 06/16/17 (from the past 48 hour(s))  Glucose, capillary     Status: Abnormal   Collection Time: 06/19/17  8:29 PM  Result Value Ref Range   Glucose-Capillary 118 (H) 65 - 99 mg/dL  Basic metabolic panel     Status: Abnormal   Collection Time: 06/20/17  6:10 AM  Result Value Ref Range   Sodium 140 135 - 145 mmol/L   Potassium 3.5 3.5 - 5.1 mmol/L   Chloride 107 101 - 111 mmol/L   CO2 21 (L) 22 - 32 mmol/L   Glucose, Bld 119 (H) 65 - 99 mg/dL   BUN 11 6 - 20 mg/dL   Creatinine, Ser 0.95 0.61 - 1.24 mg/dL   Calcium 9.8 8.9 - 10.3 mg/dL   GFR calc non Af Amer >60 >60 mL/min   GFR calc Af Amer >60 >60 mL/min    Comment: (NOTE) The eGFR has been calculated using the CKD EPI equation. This calculation has not been validated in all clinical situations. eGFR's persistently <60 mL/min signify possible Chronic Kidney Disease.    Anion gap 12 5 - 15    Comment: Performed at [facility], [place] [address], [place],  [address]    Blood Alcohol level:  Lab Results  Component Value Date   ETH <10 06/14/2017   ETH <5 03/01/7627    Metabolic Disorder Labs: No results found for: HGBA1C, MPG No results found for: PROLACTIN No results found for: CHOL, TRIG, HDL, CHOLHDL, VLDL, LDLCALC  Physical Findings: AIMS: Facial and Oral Movements Muscles of Facial Expression: None, normal Lips and Perioral Area: None, normal Jaw: None, normal Tongue: None, normal,Extremity Movements Upper (arms, wrists, hands, fingers): None, normal Lower (legs, knees, ankles, toes): None, normal, Trunk Movements Neck, shoulders, hips: None, normal, Overall  Severity Severity of abnormal movements (highest score from questions above): None, normal Incapacitation due to abnormal movements: None, normal Patient's awareness of abnormal movements (rate only patient's report): No Awareness, Dental Status Current problems with teeth and/or dentures?: No Does patient usually wear dentures?: No  CIWA:  CIWA-Ar Total: 0 COWS:  COWS Total Score: 9  Musculoskeletal:  Strength & Muscle Tone: within normal limits Gait & Station: normal Patient leans: N/A  Psychiatric Specialty Exam: Physical Exam  Nursing note and vitals reviewed. Constitutional: He appears well-developed.  Neurological: He is alert.  Psychiatric: He has a normal mood and affect.    Review of Systems  Psychiatric/Behavioral: Positive for depression. Negative for suicidal ideas. The patient is nervous/anxious.     Blood pressure (!) 125/102, pulse (!) 101, temperature 99.5 F (37.5 C), temperature source Oral, resp. rate 16, height 6' 1"  (1.854 m), weight 99.3 kg (219 lb), SpO2 100 %.Body mass index is 28.89 kg/m.  General Appearance: Guarded  Eye Contact:  Fair  Speech:  Clear and Coherent intermittent mumbling   Volume:  Normal with fluctuation   Mood:  Angry, Anxious, Depressed and Irritable  Affect:  Flat and Labile  Thought Process:  Coherent  Orientation:  Full (Time, Place, and Person)  Thought Content:  Hallucinations: Auditory  Suicidal Thoughts:  No  Homicidal Thoughts:  No  Memory:  Immediate;   Fair Recent;   Fair Remote;   Fair  Judgement:  Fair  Insight:  Shallow  Psychomotor Activity:  Normal  Concentration:  Concentration: Fair  Recall:  [employer] of Knowledge:  Fair  Language:  Fair  Akathisia:  No  Handed:  Right  AIMS (if indicated):     Assets:  Communication Skills Resilience Social Support  ADL's:  Intact  Cognition:  WNL  Sleep:  Number of Hours: 1     I agree with current treatment plan on 06/21/2017, Patient seen face-to-face for  psychiatric evaluation follow-up, chart reviewed. Reviewed the information documented and agree with the treatment plan.  Treatment Plan Summary: Daily contact with patient to assess and evaluate symptoms and progress in treatment and Medication management   Treatment plan reviewed as below today 10/14  -Encourage group and milieu participation to work on coping skills and symptom reduction -Continue Cogentin 0.5 mg prn for EPS. -Increased Invega 59m PO QHS for mood stablilization -Discontinued  fluphenazine (Prolixin) to 5 mgrs BID for psychosis and for mood disorder -Continue Hydroxyzine 25 mg prn for anxiety Q 6 hours and 50 mgrs Q HS PRN for insomnia  -Start  Ativan 1 mg tablet Q 6 hours severe anxiety or  for CIWA >10. Start clonidine taper/ monitor COWS -Treatment team working on disposition pGileadLewis, NP 06/21/2017, 10:25 AM

## 2017-06-21 NOTE — Progress Notes (Signed)
Nursing 1:1 note D:Pt observed sleeping in bed with eyes closed. RR even and unlabored. No distress noted. A: 1:1 observation continues for safety  R: pt remains safe  

## 2017-06-22 MED ORDER — LORAZEPAM 1 MG PO TABS
ORAL_TABLET | ORAL | Status: AC
  Filled 2017-06-22: qty 1

## 2017-06-22 MED ORDER — LIDOCAINE 5 % EX PTCH
1.0000 | MEDICATED_PATCH | CUTANEOUS | Status: DC
  Administered 2017-06-22 – 2017-06-23 (×2): 1 via TRANSDERMAL
  Filled 2017-06-22 (×7): qty 1

## 2017-06-22 MED ORDER — LORAZEPAM 1 MG PO TABS
1.0000 mg | ORAL_TABLET | Freq: Four times a day (QID) | ORAL | Status: DC | PRN
  Administered 2017-06-22 – 2017-06-26 (×12): 1 mg via ORAL
  Filled 2017-06-22 (×11): qty 1

## 2017-06-22 NOTE — Progress Notes (Signed)
Pt continues to come to the nursing station complaining of somatic issues, when pt is told he can't have anything for on issue he finds another, or he will ask "what can I have"

## 2017-06-22 NOTE — Progress Notes (Signed)
Recreation Therapy Notes  Date: 06/22/17 Time: 1000 Location: 500 Hall Dayroom  Group Topic: Goal Setting  Goal Area(s) Addresses:  Patient will be able to identify at least 3 life goals.   Patient will be able to identify benefit of investing in life goals.  Patient will be able to identify benefit of setting life goals.   Behavioral Response:  Engaged  Intervention: Worksheets, pens  Activity: Life Goals.  Patients were given a worksheet with six categories (family, friends, work/school, spirituality, body and mental health).  Patients were to explain what they were doing well, what they needed to improve and make a goal for each category.    Education:  Discharge Planning, Radiographer, therapeutic, Leisure Education   Education Outcome: Acknowledges Education/In Group Clarification Provided/Needs Additional Education  Clinical Observations: Pt stated you need goals so you can "see what you're doing, so you can keep reaching and reaching and doing better".  Pt had a hard time focusing but completed half his sheet with assistance.  Pt needed constant redirection.  Pt left early and did not return.   Victorino Sparrow, LRT/CTRS         Victorino Sparrow A 06/22/2017 12:29 PM

## 2017-06-22 NOTE — Progress Notes (Signed)
Patient ID: [name], male   DOB: 1970/11/03, 46 y.o.   MRN: [id number]  Close Observation-   D: Pt currently sitting in the dayroom talking with his sitter. Sitter is currently at patients side. A: Emotional support given, pt given opportunity to express concerns. Sitter encouraged to share any pertinent observations.  R: Pt remains safe on a Close Observation per MD orders. Pt in no current distress. Will continue to monitor.

## 2017-06-22 NOTE — Progress Notes (Signed)
Nursing 1:1 note D:Pt observed pacing hall. RR even and unlabored. No distress noted. A: 1:1 observation continues for safety  R: pt remains safe

## 2017-06-22 NOTE — Progress Notes (Signed)
Pt up at the nursing station demanding medication, pt continue to state he needs medication to help him calm down and to stop hearing voices. Pt continues to present with med seeking behaviors. Pt continues to ask for whatever he can have. Pt does not want to listen when writer tries to offer non-pharmacological methods for his anxiety

## 2017-06-22 NOTE — [facility] Group Notes (Signed)
LCSW Group Therapy Note   06/22/2017 1:15pm   Type of Therapy and Topic:  Group Therapy:  Overcoming Obstacles   Participation Level:  Did Not Attend   Description of Group:    In this group patients will be encouraged to explore what they see as obstacles to their own wellness and recovery. They will be guided to discuss their thoughts, feelings, and behaviors related to these obstacles. The group will process together ways to cope with barriers, with attention given to specific choices patients can make. Each patient will be challenged to identify changes they are motivated to make in order to overcome their obstacles. This group will be process-oriented, with patients participating in exploration of their own experiences as well as giving and receiving support and challenge from other group members.   Therapeutic Goals: 1. Patient will identify personal and current obstacles as they relate to admission. 2. Patient will identify barriers that currently interfere with their wellness or overcoming obstacles.  3. Patient will identify feelings, thought process and behaviors related to these barriers. 4. Patient will identify two changes they are willing to make to overcome these obstacles:      Summary of Patient Progress Patient participated in a group exercise which identified challenges and identified positive/negative verbal, cognitive and behavioral responses.  Patient participated in a mindfulness exercise and a game designed to encourage making choices     Therapeutic Modalities:   Cognitive Behavioral Therapy Solution Focused Therapy Motivational Interviewing Relapse Prevention Therapy  Beverely Pace, LCSW 06/22/2017 1:11 PM

## 2017-06-22 NOTE — Progress Notes (Signed)
Pt appeared to set his watch to wake him up so he could get his medication

## 2017-06-22 NOTE — Progress Notes (Addendum)
Patient ID: [name], male   DOB: 1971-02-09, 46 y.o.   MRN: [id number]  Pt currently presents with a blunted affect and logical/coherent but slurred speech. Pt behavior is restless, mood is irritable and needs frequent staff intervention to maintain control. Pt reports to Probation officer that their goal is to "get of this 1:1 tomorrow, this is the longest I have ever been on one." Pt states "I don't want to hurt anything or anyone, I won't hit anything." Pt reports poor sleep with current medication regimen. Pt preoccupied with medications and chronic back pain.   Pt provided with scheduled and as needed medications per providers orders. Pt's labs and vitals were monitored throughout the night. Pt given a 1:1 about emotional and mental status. Pt supported and encouraged to express concerns and questions. Pt educated on medications and rules/regulations of the unit. Pt remains on a close observation for safety and impulsivity.   Pt's safety ensured with 15 minute and environmental checks. Endorses AH of voices telling him "things that don't make sense." Pt currently denies SI/HI and visual hallucinations. Pt verbally agrees to seek staff if SI/HI or VH occurs, AH worsens and to consult with staff before acting on any harmful thoughts. Will continue POC.

## 2017-06-22 NOTE — Tx Team (Signed)
Interdisciplinary Treatment and Diagnostic Plan Update  06/22/2017 Time of Session: 9:00 AM  [name] MRN: [id number]  Principal Diagnosis: Schizoaffective disorder, bipolar type ([place])  Secondary Diagnoses: Principal Problem:   Schizoaffective disorder, bipolar type ([place])   Current Medications:  Current Facility-Administered Medications  Medication Dose Route Frequency Provider Last Rate Last Dose  . LORazepam (ATIVAN) 1 MG tablet           . acetaminophen (TYLENOL) tablet 650 mg  650 mg Oral Q6H PRN Okonkwo, Justina A, NP   650 mg at 06/22/17 0231  . alum & mag hydroxide-simeth (MAALOX/MYLANTA) 200-200-20 MG/5ML suspension 30 mL  30 mL Oral Q4H PRN Okonkwo, Justina A, NP   30 mL at 06/22/17 0457  . amLODipine (NORVASC) tablet 5 mg  5 mg Oral Daily Aundra Dubin, MD   5 mg at 06/22/17 1655  . benztropine (COGENTIN) tablet 0.5 mg  0.5 mg Oral BID PRN Cobos, Fernando A, MD      . cloNIDine (CATAPRES) tablet 0.1 mg  0.1 mg Oral QID Derrill Center, NP   0.1 mg at 06/22/17 1157   Followed by  . [START ON 06/23/2017] cloNIDine (CATAPRES) tablet 0.1 mg  0.1 mg Oral BH-qamhs Derrill Center, NP       Followed by  . [START ON 06/25/2017] cloNIDine (CATAPRES) tablet 0.1 mg  0.1 mg Oral QAC breakfast Derrill Center, NP      . dicyclomine (BENTYL) tablet 20 mg  20 mg Oral Q6H PRN Derrill Center, NP      . hydrOXYzine (ATARAX/VISTARIL) tablet 25 mg  25 mg Oral Q6H PRN Derrill Center, NP      . hydrOXYzine (ATARAX/VISTARIL) tablet 50 mg  50 mg Oral QHS Lindell Spar I, NP   50 mg at 06/21/17 2126  . loperamide (IMODIUM) capsule 2-4 mg  2-4 mg Oral PRN Derrill Center, NP      . LORazepam (ATIVAN) tablet 1 mg  1 mg Oral Q6H PRN Cobos, Myer Peer, MD   1 mg at 06/22/17 1049  . magnesium hydroxide (MILK OF MAGNESIA) suspension 30 mL  30 mL Oral Daily PRN Okonkwo, Justina A, NP   30 mL at 06/17/17 1322  . methocarbamol (ROBAXIN) tablet 500 mg  500 mg Oral Q8H PRN Derrill Center, NP   500  mg at 06/21/17 2126  . multivitamin with minerals tablet 1 tablet  1 tablet Oral Daily Cobos, Myer Peer, MD   1 tablet at 06/18/17 0900  . ondansetron (ZOFRAN-ODT) disintegrating tablet 4 mg  4 mg Oral Q6H PRN Derrill Center, NP      . paliperidone (INVEGA) 24 hr tablet 9 mg  9 mg Oral QHS Cobos, Myer Peer, MD   9 mg at 06/21/17 2125  . sulfamethoxazole-trimethoprim (BACTRIM DS,SEPTRA DS) 800-160 MG per tablet 1 tablet  1 tablet Oral Q12H Derrill Center, NP   1 tablet at 06/22/17 0807  . SUMAtriptan (IMITREX) tablet 25 mg  25 mg Oral Daily PRN Aundra Dubin, MD      . thiamine (B-1) injection 100 mg  100 mg Intramuscular Once Derrill Center, NP      . thiamine (VITAMIN B-1) tablet 100 mg  100 mg Oral Daily Derrill Center, NP        PTA Medications: Prescriptions Prior to Admission  Medication Sig Dispense Refill Last Dose  . clonazePAM (KLONOPIN) 1 MG tablet Take 1-2 mg by mouth 2 (two)  times daily as needed for anxiety.   2 unknown  . doxepin (SINEQUAN) 50 MG capsule Take 100 mg by mouth at bedtime.   unknown  . [START ON 08/19/2017] HYDROcodone-acetaminophen (NORCO) 7.5-325 MG tablet Take 1 tablet by mouth 2 (two) times daily as needed for moderate pain.    unknown  . traMADol (ULTRAM) 50 MG tablet Take 50 mg by mouth 4 (four) times daily.    unknown    Patient Stressors: Marital or family conflict Medication change or noncompliance  Patient Strengths: Capable of independent living Curator fund of knowledge Motivation for treatment/growth  Treatment Modalities: Medication Management, Group therapy, Case management,  1 to 1 session with clinician, Psychoeducation, Recreational therapy.   Physician Treatment Plan for Primary Diagnosis: Schizoaffective disorder, bipolar type ([place]) Long Term Goal(s): Improvement in symptoms so as ready for discharge  Short Term Goals: Ability to identify changes in lifestyle to reduce recurrence of condition will  improve Ability to verbalize feelings will improve Ability to identify and develop effective coping behaviors will improve Ability to maintain clinical measurements within normal limits will improve Ability to verbalize feelings will improve Ability to demonstrate self-control will improve Compliance with prescribed medications will improve  Medication Management: Evaluate patient's response, side effects, and tolerance of medication regimen.  Therapeutic Interventions: 1 to 1 sessions, Unit Group sessions and Medication administration.  Evaluation of Outcomes: Progressing  Physician Treatment Plan for Secondary Diagnosis: Principal Problem:   Schizoaffective disorder, bipolar type ([place])   Long Term Goal(s): Improvement in symptoms so as ready for discharge  Short Term Goals: Ability to identify changes in lifestyle to reduce recurrence of condition will improve Ability to verbalize feelings will improve Ability to identify and develop effective coping behaviors will improve Ability to maintain clinical measurements within normal limits will improve Ability to verbalize feelings will improve Ability to demonstrate self-control will improve Compliance with prescribed medications will improve  Medication Management: Evaluate patient's response, side effects, and tolerance of medication regimen.  Therapeutic Interventions: 1 to 1 sessions, Unit Group sessions and Medication administration.  Evaluation of Outcomes: Not Met   RN Treatment Plan for Primary Diagnosis: Schizoaffective disorder, bipolar type ([place]) Long Term Goal(s): Knowledge of disease and therapeutic regimen to maintain health will improve  Short Term Goals: Ability to identify and develop effective coping behaviors will improve and Compliance with prescribed medications will improve  Medication Management: RN will administer medications as ordered by provider, will assess and evaluate patient's response and provide  education to patient for prescribed medication. RN will report any adverse and/or side effects to prescribing provider.  Therapeutic Interventions: 1 on 1 counseling sessions, Psychoeducation, Medication administration, Evaluate responses to treatment, Monitor vital signs and CBGs as ordered, Perform/monitor CIWA, COWS, AIMS and Fall Risk screenings as ordered, Perform wound care treatments as ordered.  Evaluation of Outcomes: Not Met   LCSW Treatment Plan for Primary Diagnosis: Schizoaffective disorder, bipolar type ([place]) Long Term Goal(s): Safe transition to appropriate next level of care at discharge, Engage patient in therapeutic group addressing interpersonal concerns.  Short Term Goals: Engage patient in aftercare planning with referrals and resources  Therapeutic Interventions: Assess for all discharge needs, 1 to 1 time with Social worker, Explore available resources and support systems, Assess for adequacy in community support network, Educate family and significant other(s) on suicide prevention, Complete Psychosocial Assessment, Interpersonal group therapy.  Evaluation of Outcomes: Met  Return home, follow up [facility] ACT team   Progress in Treatment: Attending  groups: Yes Participating in groups: Yes Taking medication as prescribed: Yes Toleration medication: Yes, no side effects reported at this time Family/Significant other contact made: No; patient refused consent for collateral contact; also cannot provide contact information  Patient understands diagnosis: Yes AEB asking for help with voices Discussing patient identified problems/goals with staff: Yes Medical problems stabilized or resolved: Yes Denies suicidal/homicidal ideation: Yes Issues/concerns per patient self-inventory: None Other: N/A  New problem(s) identified: Incidents of aggression on unit have led to property destruction, threats towards staff  New Short Term/Long Term Goal(s): "These  voices make me feel like killing myself."  Discharge Plan or Barriers: return home, follow up w ACT team  Reason for Continuation of Hospitalization: Depression Hallucinations  Medication stabilization Suicidal ideation   Estimated Length of Stay: 10/18  Attendees: Patient: [name] 06/22/2017  11:58 AM  Physician: Gabriel Earing MD 06/22/2017  11:58 AM  Nursing: Sena Hitch, RN; Opal Sidles RN 06/22/2017  11:58 AM  RN Care Manager: Lars Pinks, RN 06/22/2017  11:58 AM  Social Worker: Edwyna Shell LCSW 06/22/2017  11:58 AM  Recreational Therapist: Winfield Cunas 06/22/2017  11:58 AM  Other: Norberto Sorenson 06/22/2017  11:58 AM  Other:  06/22/2017  11:58 AM    Scribe for Treatment Team:  Edwyna Shell LCSW 06/22/2017 11:58 AM

## 2017-06-22 NOTE — Progress Notes (Signed)
Closed Observation: Patient maintained on closed supervision for safety. Patient up in the dayroom on the phone and yelling while on the phone.  Reports auditory hallucination. Appears irritable, anxious and pacing the unit.  Argumentative with staff when redirected.  Routine safety checks continues.  Medications given as prescribed but refused multivitamins and vitamin B. MD made aware.  Patient safe with supervision.

## 2017-06-22 NOTE — Progress Notes (Signed)
Closed Observation: Patient on closed observation for safety.  Patient in the dayroom attending group.  No behavioral issues noted.  Medication given as prescribed.  Patient safe on the unit with supervision.  Patient continues to need lots of redirection on the unit.

## 2017-06-22 NOTE — Progress Notes (Signed)
[facility] MD Progress Note  06/22/2017 4:34 PM [name] Strain  MRN:  [id number] Subjective:  Patient reports ongoing irritability, but does acknowledge partial improvement compared to admission. He states his mood is " a little better". He continues to report auditory hallucinations, but with less intensity than before. Of note, he was recently switched from Prolixin to Graham, and states he feels that Hinda Glatter is helping- denies medication side effects but states " I think I am on too little, need more ". Reports chronic pain, mainly back pain, and is focused on being prescribed opiate analgesic .   Objective: I have discussed case with treatment team and have met with patient. He remains vaguely irritable, dysphoric, anxious, but there is some improvement compared to admission presentation, and does seem to be better related, with improving eye contact . He acknowledges partial improvement . Denies suicidal ideations. On admission patient had reported constant hallucinations which were " torturing him". At this time reports ongoing hallucinations but states that they are easier to ignore,does not appear internally preoccupied at present. As per staff, remains irritable and vaguely agitated at times, but has been going to some groups.     Principal Problem: Schizoaffective disorder, bipolar type (HCC) Diagnosis:   Patient Active Problem List   Diagnosis Date Noted  . Hallucinations [R44.3] 07/29/2014  . Hypokalemia [E87.6] 04/28/2014  . Drug overdose [T50.901A] 04/27/2014  . Schizoaffective disorder, bipolar type (HCC) [F25.0] 03/02/2014  . Anxiety state [F41.1] 03/02/2014  . Benzodiazepine dependence (HCC) [F13.20] 03/02/2014  . Chest pain [R07.9] 10/11/2013  . EKG abnormalities [R94.31] 10/11/2013  . Chronic back pain [M54.9, G89.29]    Total Time spent with patient: 20 minutes  Past Psychiatric History:   Past Medical History:  Past Medical History:  Diagnosis Date  . Asthma   . CHF  (congestive heart failure) (HCC)   . Chronic back pain   . COPD (chronic obstructive pulmonary disease) (HCC)   . Knee pain, chronic   . Migraines   . Schizophrenia, schizo-affective (HCC)     Past Surgical History:  Procedure Laterality Date  . extraction of wisdom teeth    . KNEE SURGERY     four times   Family History: History reviewed. No pertinent family history. Family Psychiatric  History:  Social History:  History  Alcohol Use No    Comment: occ     History  Drug Use No    Social History   Social History  . Marital status: Single    Spouse name: N/A  . Number of children: N/A  . Years of education: N/A   Social History Main Topics  . Smoking status: Current Every Day Smoker    Packs/day: 3.00    Years: 10.00    Types: Cigarettes    Last attempt to quit: 02/26/2014  . Smokeless tobacco: Never Used  . Alcohol use No     Comment: occ  . Drug use: No  . Sexual activity: No   Other Topics Concern  . None   Social History Narrative  . None   Additional Social History:   Sleep: Poor  Appetite:  Fair  Current Medications: Current Facility-Administered Medications  Medication Dose Route Frequency Provider Last Rate Last Dose  . acetaminophen (TYLENOL) tablet 650 mg  650 mg Oral Q6H PRN Okonkwo, Justina A, NP   650 mg at 06/22/17 0231  . alum & mag hydroxide-simeth (MAALOX/MYLANTA) 200-200-20 MG/5ML suspension 30 mL  30 mL Oral Q4H PRN Okonkwo, Justina A,  NP   30 mL at 06/22/17 0457  . amLODipine (NORVASC) tablet 5 mg  5 mg Oral Daily Burnard Leigh, MD   5 mg at 06/22/17 1610  . benztropine (COGENTIN) tablet 0.5 mg  0.5 mg Oral BID PRN Rodgers Likes A, MD      . cloNIDine (CATAPRES) tablet 0.1 mg  0.1 mg Oral QID Oneta Rack, NP   0.1 mg at 06/22/17 1157   Followed by  . [START ON 06/23/2017] cloNIDine (CATAPRES) tablet 0.1 mg  0.1 mg Oral BH-qamhs Oneta Rack, NP       Followed by  . [START ON 06/25/2017] cloNIDine (CATAPRES) tablet  0.1 mg  0.1 mg Oral QAC breakfast Oneta Rack, NP      . dicyclomine (BENTYL) tablet 20 mg  20 mg Oral Q6H PRN Oneta Rack, NP      . hydrOXYzine (ATARAX/VISTARIL) tablet 25 mg  25 mg Oral Q6H PRN Oneta Rack, NP      . hydrOXYzine (ATARAX/VISTARIL) tablet 50 mg  50 mg Oral QHS Armandina Stammer I, NP   50 mg at 06/21/17 2126  . loperamide (IMODIUM) capsule 2-4 mg  2-4 mg Oral PRN Oneta Rack, NP      . LORazepam (ATIVAN) 1 MG tablet           . LORazepam (ATIVAN) tablet 1 mg  1 mg Oral Q6H PRN Meyah Corle, Rockey Situ, MD   1 mg at 06/22/17 1049  . magnesium hydroxide (MILK OF MAGNESIA) suspension 30 mL  30 mL Oral Daily PRN Okonkwo, Justina A, NP   30 mL at 06/17/17 1322  . methocarbamol (ROBAXIN) tablet 500 mg  500 mg Oral Q8H PRN Oneta Rack, NP   500 mg at 06/21/17 2126  . multivitamin with minerals tablet 1 tablet  1 tablet Oral Daily Morgaine Kimball, Rockey Situ, MD   1 tablet at 06/18/17 0900  . ondansetron (ZOFRAN-ODT) disintegrating tablet 4 mg  4 mg Oral Q6H PRN Oneta Rack, NP      . paliperidone (INVEGA) 24 hr tablet 9 mg  9 mg Oral QHS Alisia Vanengen, Rockey Situ, MD   9 mg at 06/21/17 2125  . sulfamethoxazole-trimethoprim (BACTRIM DS,SEPTRA DS) 800-160 MG per tablet 1 tablet  1 tablet Oral Q12H Oneta Rack, NP   1 tablet at 06/22/17 0807  . SUMAtriptan (IMITREX) tablet 25 mg  25 mg Oral Daily PRN Rene Kocher, Bo Mcclintock, MD      . thiamine (B-1) injection 100 mg  100 mg Intramuscular Once Oneta Rack, NP      . thiamine (VITAMIN B-1) tablet 100 mg  100 mg Oral Daily Oneta Rack, NP        Lab Results:  Results for orders placed or performed during the hospital encounter of 06/16/17 (from the past 48 hour(s))  Urinalysis, Routine w reflex microscopic     Status: Abnormal   Collection Time: 06/21/17 12:41 PM  Result Value Ref Range   Color, Urine STRAW (A) YELLOW   APPearance CLEAR CLEAR   Specific Gravity, Urine 1.004 (L) 1.005 - 1.030   pH 5.0 5.0 - 8.0   Glucose, UA  NEGATIVE NEGATIVE mg/dL   Hgb urine dipstick NEGATIVE NEGATIVE   Bilirubin Urine NEGATIVE NEGATIVE   Ketones, ur NEGATIVE NEGATIVE mg/dL   Protein, ur NEGATIVE NEGATIVE mg/dL   Nitrite NEGATIVE NEGATIVE   Leukocytes, UA NEGATIVE NEGATIVE    Comment: Performed at [facility],  [address], [place], [place] [address]    Blood Alcohol level:  Lab Results  Component Value Date   ETH <10 06/14/2017   ETH <5 02/15/2017    Metabolic Disorder Labs: No results found for: HGBA1C, MPG No results found for: PROLACTIN No results found for: CHOL, TRIG, HDL, CHOLHDL, VLDL, LDLCALC  Physical Findings: AIMS: Facial and Oral Movements Muscles of Facial Expression: None, normal Lips and Perioral Area: None, normal Jaw: None, normal Tongue: None, normal,Extremity Movements Upper (arms, wrists, hands, fingers): None, normal Lower (legs, knees, ankles, toes): None, normal, Trunk Movements Neck, shoulders, hips: None, normal, Overall Severity Severity of abnormal movements (highest score from questions above): None, normal Incapacitation due to abnormal movements: None, normal Patient's awareness of abnormal movements (rate only patient's report): No Awareness, Dental Status Current problems with teeth and/or dentures?: No Does patient usually wear dentures?: No  CIWA:  CIWA-Ar Total: 1 COWS:  COWS Total Score: 3  Musculoskeletal: Strength & Muscle Tone: within normal limits Gait & Station: normal Patient leans: N/A  Psychiatric Specialty Exam: Physical Exam  Nursing note and vitals reviewed. Constitutional: He appears well-developed.  Neurological: He is alert.  Psychiatric: He has a normal mood and affect.    Review of Systems  Psychiatric/Behavioral: Positive for depression. Negative for suicidal ideas. The patient is nervous/anxious.   denies chest pain, no dyspnea, (+) chronic back pain  Blood pressure 101/68, pulse 66, temperature 98.7 F (37.1 C), resp.  rate 16, height 6\' 1"  (1.854 m), weight 99.3 kg (219 lb), SpO2 100 %.Body mass index is 28.89 kg/m.  General Appearance: improved grooming, vaguely guarded, suspicious   Eye Contact:  improved   Speech:  pressured at times    Volume:  Normal   Mood:  Irritable  Affect:  Labile  Thought Process:  Linear and Descriptions of Associations: Circumstantial- patient is better organized in thought process at this time  Orientation:  Full (Time, Place, and Person)  Thought Content:  reports ongoing hallucinations ( auditory) which have subsided partially - not internally preoccupied at this time  Suicidal Thoughts:  No denies suicidal plan or intention, reports feeling angry with staff, but denies homicidal plans /intentions   Homicidal Thoughts:  No  Memory:  Immediate;   Fair Recent;   Fair Remote;   Fair  Judgement:  Fair  Insight:  Fair  Psychomotor Activity:  Normal- pacing at times   Concentration:  Concentration: improving  and Attention Span: improving   Recall:  [employer] of Knowledge:  Fair  Language:  Good  Akathisia:  No  Handed:  Right  AIMS (if indicated):     Assets:  Communication Skills Resilience Social Support  ADL's:  Intact  Cognition:  WNL  Sleep:  Number of Hours: 2.75    Assessment - partial improvement compared to admission- presents better groomed, with good eye contact, more reactive affect, and acknowledging some improvement of hallucinations. Remains irritable , however, and sleep is poor to fair. Currently tolerating Invega well . He is somatically focused .  Treatment Plan Summary: Daily contact with patient to assess and evaluate symptoms and progress in treatment and Medication management  Treatment plan reviewed today 10/15. -Encourage group and milieu participation to work on coping skills and symptom reduction -Continue Cogentin 0.5 mg prn for EPS. -As patient not endorsing current Opiate WDL symptoms , vitals stable, and admission UDS negative for  opiates, will discontinue Clonidine taper at this time, to minimize side effect risk. -Continue  Invega 9mg   QHS for mood stabilization, psychosis -Lidoderm patch for back pain  -Continue Hydroxyzine 25 mg prn for anxiety Q 6 hours and 50 mgrs Q HS PRN for insomnia  -Continue  Ativan 1 mg tablet Q 6 hours PRN for anxiety  -Treatment team working on disposition planning  -Recheck EKG to monitor QTc  Craige Cotta, MD 06/22/2017, 4:34 PM   Patient ID: [name], male   DOB: 1971-01-07, 46 y.o.   MRN: [id number]

## 2017-06-22 NOTE — Progress Notes (Signed)
"  I try to do the best that I can with what I have , I can't get no relief without medicine". Pt appears to have no insight into Tx

## 2017-06-22 NOTE — Progress Notes (Signed)
Pt continues to complain the voices are non stop

## 2017-06-22 NOTE — Progress Notes (Signed)
Nursing 1:1 note D:Pt observed sleeping in bed with eyes closed. RR even and unlabored. No distress noted. A: 1:1 observation continues for safety  R: pt remains safe  

## 2017-06-22 NOTE — Progress Notes (Signed)
Closed Observation: Patient maintained on closed observation for safety.  Patient is in bed sleeping.  Routine safety checks maintained.  Denies suicidal thoughts and visual hallucination.  Patient safe on the unit with supervision.

## 2017-06-22 NOTE — Progress Notes (Signed)
Pt got up stating he needed something for the voices, "I need ativan". Pt was informed that Ativan was not ordered for the voices. " I am anxious then, I need Ativan". Pt was informed that he could not get his next Ativan until 2:23, "I'll see you then"

## 2017-06-22 NOTE — Plan of Care (Signed)
Problem: Safety: Goal: Ability to remain free from injury will improve Outcome: Progressing Patient is free from injury.  Routine safety checks maintained every 15 minutes.

## 2017-06-22 NOTE — Progress Notes (Signed)
Pt up to the nursing station " Is there anything I can get to help me sleep" . Pt was informed that sleep medication is not given after 2 am, pt stated " someone told me last night I have to wait until after 2:30 for something to help me sleep" . Pt was informed that to my knowledge , with me being your Nurse I did not tell you that. Pt then asked Is there anything I can have.

## 2017-06-23 DIAGNOSIS — F1310 Sedative, hypnotic or anxiolytic abuse, uncomplicated: Secondary | ICD-10-CM

## 2017-06-23 DIAGNOSIS — F1010 Alcohol abuse, uncomplicated: Secondary | ICD-10-CM

## 2017-06-23 DIAGNOSIS — X789XXA Intentional self-harm by unspecified sharp object, initial encounter: Secondary | ICD-10-CM

## 2017-06-23 DIAGNOSIS — Z91410 Personal history of adult physical and sexual abuse: Secondary | ICD-10-CM

## 2017-06-23 DIAGNOSIS — S51812A Laceration without foreign body of left forearm, initial encounter: Secondary | ICD-10-CM

## 2017-06-23 NOTE — Progress Notes (Signed)
Recreation Therapy Notes  Date: 06/23/17 Time: 1000 Location: 500 Hall Dayroom  Group Topic: Coping Skills  Goal Area(s) Addresses:  Patients will be able to identify positive coping skills. Patients will be able to identify the benefits of coping skills. Patients will be able to identify benefits of using coping skills post d/c.  Behavioral Response: Engaged  Intervention: Worksheet, pens  Activity: Mind map.  Patients were given a blank mind map.  LRT and patients filled in the first eight boxes together.  Patients identified depression, anxiety, loneliness, homeless, anger, hearing voices, seeing things and overdose as things we need coping skills for.  Patients were to then come up with three coping skills for each of the eight problems identified.  Education: Radiographer, therapeutic, Dentist.   Education Outcome: Acknowledges understanding/In group clarification offered/Needs additional education.   Clinical Observations/Feedback: Pt was active and social with peers.  Pt needed redirection to not make inappropriate comments during group.  Pt would apologize when corrected.  Pt identified some of his coping skill as ear plugs for hearing voices and get some friends for loneliness.  Pt would identify some inappropriate coping skills but would correct himself when redirected.     Victorino Sparrow, LRT/CTRS        Ria Comment, Avelina Mcclurkin A 06/23/2017 11:28 AM

## 2017-06-23 NOTE — Plan of Care (Signed)
Problem: Safety: Goal: Periods of time without injury will increase Outcome: Progressing Patient is safe and free from injury.

## 2017-06-23 NOTE — Progress Notes (Signed)
[facility] MD Progress Note  06/23/2017 6:21 PM [name]  MRN:  [id number] Subjective:  Patient reports improvement in his clarity of thinking and emotional state, but today he notes some increased anxiety associated with AH which mostly occur around bedtime. He feels that Lorayne Bender was initially helpful, but now, he requests the dose to be increased, stating, "I think it needs to be double or something." He reports feeling tired today related to poor sleep (<3h recorded which he associates with AH and medication side effect disrupting his sleep). He reports that anxiety is responsive to PRN medications of vistaril and ativan, and pt was encouraged to utilize his PRN medications. He denies SI/HI/VH. He continues to report chronic pain symptoms in his back and knees, but he indicates that lidoderm patch is helpful.   Objective: I have discussed case with treatment team and have met with patient. Pt is irritable, dysphoric, anxious, but he shows improvement from admission presentation. Pt has notable improvement of eye contact and overall reduction of psychotic symptoms as compared to initial presentation. He denies SI/HI/VH.   Principal Problem: Schizoaffective disorder, bipolar type ([place]) Diagnosis:   Patient Active Problem List   Diagnosis Date Noted  . Hallucinations [R44.3] 07/29/2014  . Hypokalemia [E87.6] 04/28/2014  . Drug overdose [T50.901A] 04/27/2014  . Schizoaffective disorder, bipolar type ([place]) [F25.0] 03/02/2014  . Anxiety state [F41.1] 03/02/2014  . Benzodiazepine dependence ([place]) [F13.20] 03/02/2014  . Chest pain [R07.9] 10/11/2013  . EKG abnormalities [R94.31] 10/11/2013  . Chronic back pain [M54.9, G89.29]    Total Time spent with patient: 20 minutes  Past Psychiatric History:   Past Medical History:  Past Medical History:  Diagnosis Date  . Asthma   . CHF (congestive heart failure) ([place])   . Chronic back pain   . COPD (chronic obstructive pulmonary disease) ([place])   .  Knee pain, chronic   . Migraines   . Schizophrenia, schizo-affective ([place])     Past Surgical History:  Procedure Laterality Date  . extraction of wisdom teeth    . KNEE SURGERY     four times   Family History: History reviewed. No pertinent family history. Family Psychiatric  History:  Social History:  History  Alcohol Use No    Comment: occ     History  Drug Use No    Social History   Social History  . Marital status: Single    Spouse name: N/A  . Number of children: N/A  . Years of education: N/A   Social History Main Topics  . Smoking status: Current Every Day Smoker    Packs/day: 3.00    Years: 10.00    Types: Cigarettes    Last attempt to quit: 02/26/2014  . Smokeless tobacco: Never Used  . Alcohol use No     Comment: occ  . Drug use: No  . Sexual activity: No   Other Topics Concern  . None   Social History Narrative  . None   Additional Social History:   Sleep: Poor  Appetite:  Fair  Current Medications: Current Facility-Administered Medications  Medication Dose Route Frequency Provider Last Rate Last Dose  . acetaminophen (TYLENOL) tablet 650 mg  650 mg Oral Q6H PRN Okonkwo, Justina A, NP   650 mg at 06/23/17 1332  . alum & mag hydroxide-simeth (MAALOX/MYLANTA) 200-200-20 MG/5ML suspension 30 mL  30 mL Oral Q4H PRN Okonkwo, Justina A, NP   30 mL at 06/22/17 0457  . amLODipine (NORVASC) tablet 5 mg  5 mg Oral Daily Aundra Dubin, MD   5 mg at 06/23/17 0759  . benztropine (COGENTIN) tablet 0.5 mg  0.5 mg Oral BID PRN Cobos, Myer Peer, MD      . dicyclomine (BENTYL) tablet 20 mg  20 mg Oral Q6H PRN Derrill Center, NP      . hydrOXYzine (ATARAX/VISTARIL) tablet 25 mg  25 mg Oral Q6H PRN Derrill Center, NP      . lidocaine (LIDODERM) 5 % 1 patch  1 patch Transdermal Q24H Cobos, Myer Peer, MD   1 patch at 06/22/17 1700  . loperamide (IMODIUM) capsule 2-4 mg  2-4 mg Oral PRN Derrill Center, NP      . LORazepam (ATIVAN) tablet 1 mg  1 mg Oral  Q6H PRN Cobos, Myer Peer, MD   1 mg at 06/23/17 1332  . magnesium hydroxide (MILK OF MAGNESIA) suspension 30 mL  30 mL Oral Daily PRN Okonkwo, Justina A, NP   30 mL at 06/17/17 1322  . methocarbamol (ROBAXIN) tablet 500 mg  500 mg Oral Q8H PRN Derrill Center, NP   500 mg at 06/21/17 2126  . multivitamin with minerals tablet 1 tablet  1 tablet Oral Daily Cobos, Myer Peer, MD   1 tablet at 06/18/17 0900  . ondansetron (ZOFRAN-ODT) disintegrating tablet 4 mg  4 mg Oral Q6H PRN Derrill Center, NP      . paliperidone (INVEGA) 24 hr tablet 9 mg  9 mg Oral QHS Cobos, Myer Peer, MD   9 mg at 06/22/17 2252  . sulfamethoxazole-trimethoprim (BACTRIM DS,SEPTRA DS) 800-160 MG per tablet 1 tablet  1 tablet Oral Q12H Derrill Center, NP   1 tablet at 06/23/17 0759  . SUMAtriptan (IMITREX) tablet 25 mg  25 mg Oral Daily PRN Aundra Dubin, MD      . thiamine (B-1) injection 100 mg  100 mg Intramuscular Once Derrill Center, NP      . thiamine (VITAMIN B-1) tablet 100 mg  100 mg Oral Daily Derrill Center, NP        Lab Results:  No results found for this or any previous visit (from the past 44 hour(s)).  Blood Alcohol level:  Lab Results  Component Value Date   ETH <10 06/14/2017   ETH <5 62/94/7654    Metabolic Disorder Labs: No results found for: HGBA1C, MPG No results found for: PROLACTIN No results found for: CHOL, TRIG, HDL, CHOLHDL, VLDL, LDLCALC  Physical Findings: AIMS: Facial and Oral Movements Muscles of Facial Expression: None, normal Lips and Perioral Area: None, normal Jaw: None, normal Tongue: None, normal,Extremity Movements Upper (arms, wrists, hands, fingers): None, normal Lower (legs, knees, ankles, toes): None, normal, Trunk Movements Neck, shoulders, hips: None, normal, Overall Severity Severity of abnormal movements (highest score from questions above): None, normal Incapacitation due to abnormal movements: None, normal Patient's awareness of abnormal movements  (rate only patient's report): No Awareness, Dental Status Current problems with teeth and/or dentures?: No Does patient usually wear dentures?: No  CIWA:  CIWA-Ar Total: 0 COWS:  COWS Total Score: 0  Musculoskeletal: Strength & Muscle Tone: within normal limits Gait & Station: normal Patient leans: N/A  Psychiatric Specialty Exam: Physical Exam  Nursing note and vitals reviewed. Constitutional: He appears well-developed.  Neurological: He is alert.  Psychiatric: He has a normal mood and affect.    Review of Systems  Psychiatric/Behavioral: Positive for depression. Negative for suicidal ideas. The patient is nervous/anxious.  Blood pressure 137/82, pulse 78, temperature 99 F (37.2 C), temperature source Oral, resp. rate 18, height 6' 1"  (1.854 m), weight 99.3 kg (219 lb), SpO2 100 %.Body mass index is 28.89 kg/m.  General Appearance: Bizarre, Disheveled and improved eye contact  Eye Contact:  improved   Speech:  Clear and Coherent   Volume:  Normal   Mood:  Irritable  Affect:  Labile  Thought Process:  Linear and Descriptions of Associations: Circumstantial-  Orientation:  Full (Time, Place, and Person)  Thought Content:  Hallucinations: Auditory  Suicidal Thoughts:  No   Homicidal Thoughts:  No  Memory:  Immediate;   Fair Recent;   Fair Remote;   Fair  Judgement:  Fair  Insight:  Fair  Psychomotor Activity:  Normal- pacing at times   Concentration:  Concentration: improving  and Attention Span: improving   Recall:  [employer] of Knowledge:  Fair  Language:  Good  Akathisia:  No  Handed:  Right  AIMS (if indicated):     Assets:  Communication Skills Resilience Social Support  ADL's:  Intact  Cognition:  WNL  Sleep:  Number of Hours: 2.75    Assessment - partial improvement compared to admission- presents better groomed, with good eye contact, more reactive affect, and acknowledging some improvement of hallucinations. Remains irritable , however, and sleep is  poor to fair. Currently tolerating Invega well . He is somatically focused .  Treatment Plan Summary: Daily contact with patient to assess and evaluate symptoms and progress in treatment and Medication management  Treatment plan reviewed today 10/16. -Encourage group and milieu participation to work on coping skills and symptom reduction -Continue Cogentin 0.5 mg prn for EPS. -Continue Invega 47m  QHS for mood stabilization, psychosis -Lidoderm patch for back pain  -Continue Hydroxyzine 25 mg prn for anxiety Q 6 hours and 50 mgrs Q HS PRN for insomnia  -Continue  Ativan 1 mg tablet Q 6 hours PRN for anxiety  -Treatment team working on disposition planning  -Recheck EKG to monitor QTc (pending) CPennelope Bracken MD 06/23/2017, 6:21 PM   Patient ID: [name], male   DOB: 1Mar 17, 1972 46y.o.   MRN: [id number]

## 2017-06-23 NOTE — Progress Notes (Signed)
Closed Observation: Patient on closed observation for safety.  Patient in his room sleeping.  Routine safety checks continues.  Patient safe on the unit with supervision.

## 2017-06-23 NOTE — Progress Notes (Signed)
Closed Observation: Patient on closed observation for safety.  Patient pacing the hallway.  Appears to be responding and restless.  Medication given but refused multivitamins.  Routine safety checks continues.  Patient preoccupied with scheduled court date for today.  Patient safe on the unit with supervision.

## 2017-06-23 NOTE — Progress Notes (Signed)
Nursing Progress Note: 7p-7a D: Pt currently presents with a labile/irratable affect and behavior. Pt states "I can't do shit when my back and head hurt as bad as they do. Tylenol and lidocaine barely help. And I'm expected to walk around on this hard floor all day." Interacting appropriately with the milieu. Pt reports fair sleep during the previous night with current medication regimen. Pt did attend wrap-up group.  A: Pt provided with medications per providers orders. Pt's labs and vitals were monitored throughout the night. Pt supported emotionally and encouraged to express concerns and questions. Pt educated on medications.  R: Pt's safety ensured with 15 minute and environmental checks. Pt currently denies SI and HI and endorses AVH. Pt verbally contracts to seek staff if SI,HI, or AVH occurs and to consult with staff before acting on any harmful thoughts. Will continue to monitor.

## 2017-06-23 NOTE — Progress Notes (Signed)
Adult Psychoeducational Group Note  Date:  06/23/2017 Time:  8:40 PM  Group Topic/Focus:  Wrap-Up Group:   The focus of this group is to help patients review their daily goal of treatment and discuss progress on daily workbooks.  Participation Level:  Active  Participation Quality:  Appropriate  Affect:  Appropriate  Cognitive:  Alert  Insight: Appropriate  Engagement in Group:  Engaged  Modes of Intervention:  Discussion  Additional Comments:  Pt rated his day 7/10. His goal was to get his medications worked out, stay awake during the day, and be in a better mood. He states that his mood has improved as the day went on.  Wynelle Fanny R 06/23/2017, 8:40 PM

## 2017-06-23 NOTE — Progress Notes (Addendum)
Patient ID: [name], male   DOB: 1971/03/21, 46 y.o.   MRN: [id number]  Close Observation-   D: Pt currently lying in bed with eyes closed. Sitter is currently at beside. Respirations even and unlabored. A: Visual assessment completed. Sitter encouraged to share any pertinent observations, none to note.  R: Pt remains safe on a close observation per MD orders. Pt in no current distress. Will continue to monitor.

## 2017-06-23 NOTE — Progress Notes (Signed)
Patient ID: [name], male   DOB: 1971/09/02, 47 y.o.   MRN: [id number]  Pt reports he has a court hearing scheduled for 2pm today. Pt asks Probation officer to speak with CSW about missing the hearing. Lawyer if being here will get him out of having to go. Questions deferred to CSW. Will continue to monitor.

## 2017-06-23 NOTE — [facility] Group Notes (Signed)
LCSW Group Therapy Note   06/23/2017 1:15pm   Type of Therapy and Topic:  Group Therapy:  Positive Affirmations   Participation Level:  Did Not Attend  Description of Group: This group addressed positive affirmation toward self and others. Patients went around the room and identified two positive things about themselves and two positive things about a peer in the room. Patients reflected on how it felt to share something positive with others, to identify positive things about themselves, and to hear positive things from others. Patients were encouraged to have a daily reflection of positive characteristics or circumstances.  Therapeutic Goals 1. Patient will verbalize two of their positive qualities 2. Patient will demonstrate empathy for others by stating two positive qualities about a peer in the group 3. Patient will verbalize their feelings when voicing positive self affirmations and when voicing positive affirmations of others 4. Patients will discuss the potential positive impact on their wellness/recovery of focusing on positive traits of self and others. Summary of Patient Progress:    Therapeutic Modalities Cognitive Behavioral Therapy Motivational Interviewing  Trish Mage, [place] 06/23/2017 1:43 PM

## 2017-06-23 NOTE — Progress Notes (Signed)
Closed observation: Patient on closed observation for safety.  Reports hearing voices telling him to punch the wall.  Ativan 1 mg given for anxiety.  Patient visible in the dayroom watching TV and interacting with staff and peers.  Routine safety checks continues.  Patient is safe on the unit with supervision.

## 2017-06-23 NOTE — Progress Notes (Addendum)
Patient ID: [name], male   DOB: 1970-09-13, 46 y.o.   MRN: [id number]  1:1-   D: Pt currently sitting in the dayroom watching television. Sitter is currently at patients side. Pt mood is labile, irritable and then cooperative. Respirations even and unlabored. A: Emotional support given, pt given opportunity to express concerns. Sitter encouraged to share any pertinent observations, none noted.  R: Pt remains safe on a 1:1 per MD orders. Pt in no current distress. Will continue to monitor.

## 2017-06-24 MED ORDER — LORAZEPAM 1 MG PO TABS
1.0000 mg | ORAL_TABLET | Freq: Once | ORAL | Status: AC
  Administered 2017-06-24: 1 mg via ORAL
  Filled 2017-06-24: qty 1

## 2017-06-24 MED ORDER — QUETIAPINE FUMARATE 25 MG PO TABS
25.0000 mg | ORAL_TABLET | Freq: Every day | ORAL | Status: DC
  Filled 2017-06-24: qty 1

## 2017-06-24 MED ORDER — QUETIAPINE FUMARATE 25 MG PO TABS: 25.0000 mg | ORAL_TABLET | Freq: Every evening | ORAL | Status: DC | PRN

## 2017-06-24 NOTE — Progress Notes (Signed)
Pt did not attend wrap up group this evening.  

## 2017-06-24 NOTE — Progress Notes (Signed)
Closed Observation: Patient on closed observation for safety.  Patient in the dayroom. Sitter standing with pt. Routine safety checks continues.  Patient safe on the unit with supervision.

## 2017-06-24 NOTE — Progress Notes (Signed)
Closed Observation: Patient on closed observation for safety.  Patient in his room sleeping.  Routine safety checks continues.  Patient safe on the unit with supervision.

## 2017-06-24 NOTE — Progress Notes (Signed)
Close observation note: Pt observed sitting in group with sitter present. Pt calm and cooperative at this time. Pt appears to be engaged. Pt remains on close observation for safety.

## 2017-06-24 NOTE — Progress Notes (Signed)
Recreation Therapy Notes  Date: 06/24/17 Time: 1000 Location: 500 Hall Dayroom  Group Topic: Communication, Team Building, Problem Solving  Goal Area(s) Addresses:  Patient will effectively work with peer towards shared goal.  Patient will identify skills used to make activity successful.  Patient will identify how skills used during activity can be used to reach post d/c goals.   Behavioral Response: Engaged  Intervention: STEM Activity  Activity: Geophysicist/field seismologist. In teams patients were given 12 plastic drinking straws and a length of masking tape. Using the materials provided patients were asked to build a landing pad to catch a golf ball dropped from approximately 6 feet in the air.   Education: Education officer, community, Discharge Planning   Education Outcome: Acknowledges education/In group clarification offered/Needs additional education.   Clinical Observations/Feedback:  Pt worked well with his peers.  Pt shared all of the responsibility with the rest of his team to complete the task.  When asked what the group used to complete the activity pt stated "get some more tape".  Pt was bright and engaging throughout activity.    Victorino Sparrow, LRT/CTRS        Victorino Sparrow A 06/24/2017 12:27 PM

## 2017-06-24 NOTE — [facility] Group Notes (Signed)
LCSW Group Therapy Note  06/24/2017 1:15pm  Type of Therapy/Topic:  Group Therapy:  Balance in Life  Participation Level:  Active  Description of Group:    This group will address the concept of balance and how it feels and looks when one is unbalanced. Patients will be encouraged to process areas in their lives that are out of balance and identify reasons for remaining unbalanced. Facilitators will guide patients in utilizing problem-solving interventions to address and correct the stressor making their life unbalanced. Understanding and applying boundaries will be explored and addressed for obtaining and maintaining a balanced life. Patients will be encouraged to explore ways to assertively make their unbalanced needs known to significant others in their lives, using other group members and facilitator for support and feedback.  Therapeutic Goals: 1. Patient will identify two or more emotions or situations they have that consume much of in their lives. 2. Patient will identify signs/triggers that life has become out of balance:  3. Patient will identify two ways to set boundaries in order to achieve balance in their lives:  4. Patient will demonstrate ability to communicate their needs through discussion and/or role plays  Summary of Patient Progress:   [name] attended group and was there the entire time.  He was an active participant.  He states that he gets angry when he hears the voices in his head.  He responds with angry words that he regrets later on. He knows that he is angry when he begins to get a headache.  To calm himself he "zens" out and this is his form of meditation.     Therapeutic Modalities:   Cognitive Behavioral Therapy Solution-Focused Therapy Assertiveness Training  Darleen Crocker, Student-Social Work 06/24/2017 1:30 PM

## 2017-06-24 NOTE — Progress Notes (Signed)
Close obs note: Pt observed standing at the nurses station speaking with Probation officer. Sitter present at this time. Pt verbalized that his pain is now a 2 or 3 after taking robaxin and tylenol. Pt verbalized feeling calm after taking Ativan for anxiety and AH. Pt calm and cooperative at this time. Pt remains on close obs for safety.

## 2017-06-24 NOTE — Progress Notes (Signed)
Nursing Progress Note: 7p-7a D: Pt currently presents with a labile/irratable affect and behavior. Not interacting  with the milieu. Pt reports poor sleep during the previous night with current medication regimen. Pt did not attend wrap-up group.  A: Pt provided with medications per providers orders. Pt's labs and vitals were monitored throughout the night. Pt supported emotionally and encouraged to express concerns and questions. Pt educated on medications.  R: Pt's safety ensured with 15 minute and environmental checks. Pt currently denies SI, HI, and AVH. Pt verbally contracts to seek staff if SI,HI, or AVH occurs and to consult with staff before acting on any harmful thoughts. Will continue to monitor.

## 2017-06-24 NOTE — Progress Notes (Addendum)
[facility] MD Progress Note  06/24/2017 3:21 PM [name] Schaaf  MRN:  [id number] Subjective:  Patient reports he is feeling " better", and reports decreasing intensity and frequency of auditory hallucinations . He also states he feels Hinda Glatter is helping, and states " I am OK with it so far ". Denies suicidal ideations .  Objective: I have discussed case with treatment team and have met with patient. As discussed with staff patient continues to present with some irritability, at times pacing hallway, and reporting poor sleep. One issue , as reported by staff, is that he sleeps in afternoons and is then up at night time, with disorganized sleep wake cycle at this time. At this time he does present with partial improvement and although dysphoric and irritable, is less so than on admission. He has had no further episodes of overt agitation ( 3 days he attempted to to pull out a camera out of the wall, due to paranoid ideations) . He does report some ongoing hallucinations, and states that he feels these are " crucifying him" , but acknowledges they are becoming less intense . Denies suicidal ideations at present. Remains medication focused, requesting opiates for pain and increased BZD doses, but again , this is partially improved, and presents calmer, less irritable, with less intense affect.     Principal Problem: Schizoaffective disorder, bipolar type (HCC) Diagnosis:   Patient Active Problem List   Diagnosis Date Noted  . Hallucinations [R44.3] 07/29/2014  . Hypokalemia [E87.6] 04/28/2014  . Drug overdose [T50.901A] 04/27/2014  . Schizoaffective disorder, bipolar type (HCC) [F25.0] 03/02/2014  . Anxiety state [F41.1] 03/02/2014  . Benzodiazepine dependence (HCC) [F13.20] 03/02/2014  . Chest pain [R07.9] 10/11/2013  . EKG abnormalities [R94.31] 10/11/2013  . Chronic back pain [M54.9, G89.29]    Total Time spent with patient: 20 minutes  Past Psychiatric History:   Past Medical History:  Past  Medical History:  Diagnosis Date  . Asthma   . CHF (congestive heart failure) (HCC)   . Chronic back pain   . COPD (chronic obstructive pulmonary disease) (HCC)   . Knee pain, chronic   . Migraines   . Schizophrenia, schizo-affective (HCC)     Past Surgical History:  Procedure Laterality Date  . extraction of wisdom teeth    . KNEE SURGERY     four times   Family History: History reviewed. No pertinent family history. Family Psychiatric  History:  Social History:  History  Alcohol Use No    Comment: occ     History  Drug Use No    Social History   Social History  . Marital status: Single    Spouse name: N/A  . Number of children: N/A  . Years of education: N/A   Social History Main Topics  . Smoking status: Current Every Day Smoker    Packs/day: 3.00    Years: 10.00    Types: Cigarettes    Last attempt to quit: 02/26/2014  . Smokeless tobacco: Never Used  . Alcohol use No     Comment: occ  . Drug use: No  . Sexual activity: No   Other Topics Concern  . None   Social History Narrative  . None   Additional Social History:   Sleep: Poor  Appetite:  Fair  Current Medications: Current Facility-Administered Medications  Medication Dose Route Frequency Provider Last Rate Last Dose  . acetaminophen (TYLENOL) tablet 650 mg  650 mg Oral Q6H PRN Okonkwo, Justina A, NP   650  mg at 06/24/17 0531  . alum & mag hydroxide-simeth (MAALOX/MYLANTA) 200-200-20 MG/5ML suspension 30 mL  30 mL Oral Q4H PRN Okonkwo, Justina A, NP   30 mL at 06/22/17 0457  . amLODipine (NORVASC) tablet 5 mg  5 mg Oral Daily Burnard Leigh, MD   5 mg at 06/24/17 0806  . benztropine (COGENTIN) tablet 0.5 mg  0.5 mg Oral BID PRN Kenzlee Fishburn, Rockey Situ, MD      . dicyclomine (BENTYL) tablet 20 mg  20 mg Oral Q6H PRN Oneta Rack, NP      . lidocaine (LIDODERM) 5 % 1 patch  1 patch Transdermal Q24H Elijah Michaelis, Rockey Situ, MD   1 patch at 06/23/17 2002  . LORazepam (ATIVAN) tablet 1 mg  1 mg Oral  Q6H PRN Camari Wisham, Rockey Situ, MD   1 mg at 06/24/17 1428  . magnesium hydroxide (MILK OF MAGNESIA) suspension 30 mL  30 mL Oral Daily PRN Okonkwo, Justina A, NP   30 mL at 06/17/17 1322  . methocarbamol (ROBAXIN) tablet 500 mg  500 mg Oral Q8H PRN Oneta Rack, NP   500 mg at 06/21/17 2126  . multivitamin with minerals tablet 1 tablet  1 tablet Oral Daily Gyselle Matthew, Rockey Situ, MD   1 tablet at 06/18/17 0900  . paliperidone (INVEGA) 24 hr tablet 9 mg  9 mg Oral QHS Makenna Macaluso, Rockey Situ, MD   9 mg at 06/23/17 2156  . SUMAtriptan (IMITREX) tablet 25 mg  25 mg Oral Daily PRN Burnard Leigh, MD   25 mg at 06/23/17 2046  . thiamine (B-1) injection 100 mg  100 mg Intramuscular Once Oneta Rack, NP      . thiamine (VITAMIN B-1) tablet 100 mg  100 mg Oral Daily Oneta Rack, NP        Lab Results:  No results found for this or any previous visit (from the past 48 hour(s)).  Blood Alcohol level:  Lab Results  Component Value Date   ETH <10 06/14/2017   ETH <5 02/15/2017    Metabolic Disorder Labs: No results found for: HGBA1C, MPG No results found for: PROLACTIN No results found for: CHOL, TRIG, HDL, CHOLHDL, VLDL, LDLCALC  Physical Findings: AIMS: Facial and Oral Movements Muscles of Facial Expression: None, normal Lips and Perioral Area: None, normal Jaw: None, normal Tongue: None, normal,Extremity Movements Upper (arms, wrists, hands, fingers): None, normal Lower (legs, knees, ankles, toes): None, normal, Trunk Movements Neck, shoulders, hips: None, normal, Overall Severity Severity of abnormal movements (highest score from questions above): None, normal Incapacitation due to abnormal movements: None, normal Patient's awareness of abnormal movements (rate only patient's report): No Awareness, Dental Status Current problems with teeth and/or dentures?: No Does patient usually wear dentures?: No  CIWA:  CIWA-Ar Total: 0 COWS:  COWS Total Score: 3  Musculoskeletal: Strength &  Muscle Tone: within normal limits Gait & Station: normal Patient leans: N/A  Psychiatric Specialty Exam: Physical Exam  Nursing note and vitals reviewed. Constitutional: He appears well-developed.  Neurological: He is alert.  Psychiatric: He has a normal mood and affect.    Review of Systems  Psychiatric/Behavioral: Positive for depression. Negative for suicidal ideas. The patient is nervous/anxious.   denies chest pain, no dyspnea, (+) chronic back pain  Blood pressure 127/84, pulse 89, temperature 98.2 F (36.8 C), temperature source Oral, resp. rate 17, height 6\' 1"  (1.854 m), weight 99.3 kg (219 lb), SpO2 100 %.Body mass index is 28.89 kg/m.  General  Appearance: Fairly Groomed  Eye Contact:  Good  Speech:  less pressured, normal   Volume:  Normal   Mood:  partially improved, less irritable   Affect:  less irritable, less labile  Thought Process:  Linear and Descriptions of Associations: Intact-  better organized in thought process at this time  Orientation:  Other:  fully alert and attentive   Thought Content:  improving hallucinations , not internally preoccupied at this time, no delusions expressed at present - not internally preoccupied at this time  Suicidal Thoughts:  No denies suicidal plan or intention, also denies homicidal or violent ideations  Homicidal Thoughts:  No  Memory:  Immediate;   Fair Recent;   Fair Remote;   Fair  Judgement:  Fair- gradually improving   Insight:  Fair  Psychomotor Activity:  Normal- pacing at times , but calm and sitting down comfortably during this session  Concentration:  Concentration: improving  and Attention Span: improving   Recall:  [employer] of Knowledge:  Fair  Language:  Good  Akathisia:  No  Handed:  Right  AIMS (if indicated):     Assets:  Communication Skills Resilience Social Support  ADL's:  Intact  Cognition:  WNL  Sleep:  Number of Hours: 2.5    Assessment - patient presents less irritable, less dysphoric,  and better related. He does continue to present labile and vaguely irritable, but certainly less than on admission. He reports hallucinations have decreased and currently does not appear to be in any acute distress. Sleep remains poor, states he only slept about 2-3 hours last night . Thus far tolerating Invega well .We discussed options, reviewed sleep hygiene recommendations. Patient states Trazodone poorly tolerated . States Seroquel has helped him sleep in the past, agrees to low dose Seroquel in order to help address residual symptoms and insomnia . Side effects reviewed . *Of note, Seroquel is listed as an allergy. I have carefully reviewed this with patient, states he has had no allergic reactions to Seroquel and has taken it in the past without concerns. States it causes sedation , but denies rash, shortness of breath, or any mucosal involvement when taking this medication.  Treatment Plan Summary: Daily contact with patient to assess and evaluate symptoms and progress in treatment and Medication management  Treatment plan reviewed today 10/17 -Encourage group and milieu participation to work on coping skills and symptom reduction -Continue Cogentin 0.5 mg PRN for EPS. -Continue Invega 9 mg  QHS for mood stabilization, psychosis -Start Seroquel 25 mgrs QHS PRN for  insomnia - see above  -Continue Lidoderm patch for back pain  -Continue Hydroxyzine 25 mg prn for anxiety Q 6 hours and 50 mgrs QHS PRN for insomnia  -Continue  Ativan 1 mg tablet Q 6 hours PRN for anxiety  -Treatment team working on disposition planning  -Repeat EKG to monitor QTc and CBC , routine as recently started on Invega trial . Craige Cotta, MD 06/24/2017, 3:21 PM   Patient ID: [name], male   DOB: 11-16-1970, 46 y.o.   MRN: [id number]

## 2017-06-24 NOTE — Progress Notes (Signed)
  Close observation: Pt observed sitting in the dayroom. Pt noted to be anxious this morning, pacing the hallway and irritable. Pt mood is labile. Pt endorses AH. Pt reports difficulty sleeping last night. Per RN report, pt was up all night, pacing the hallway and yelling. Per sitter report, pt has been mumbling to himself this morning and irritable. Pt remains on close observation for safety.

## 2017-06-25 LAB — CBC WITH DIFFERENTIAL/PLATELET
BASOS PCT: 1 %
Basophils Absolute: 0.1 10*3/uL (ref 0.0–0.1)
Eosinophils Absolute: 0 10*3/uL (ref 0.0–0.7)
Eosinophils Relative: 1 %
HEMATOCRIT: 48.5 % (ref 39.0–52.0)
HEMOGLOBIN: 16.6 g/dL (ref 13.0–17.0)
LYMPHS ABS: 2.5 10*3/uL (ref 0.7–4.0)
LYMPHS PCT: 33 %
MCH: 29.5 pg (ref 26.0–34.0)
MCHC: 34.2 g/dL (ref 30.0–36.0)
MCV: 86.1 fL (ref 78.0–100.0)
MONOS PCT: 8 %
Monocytes Absolute: 0.6 10*3/uL (ref 0.1–1.0)
NEUTROS ABS: 4.3 10*3/uL (ref 1.7–7.7)
NEUTROS PCT: 57 %
Platelets: 247 10*3/uL (ref 150–400)
RBC: 5.63 MIL/uL (ref 4.22–5.81)
RDW: 12.8 % (ref 11.5–15.5)
WBC: 7.4 10*3/uL (ref 4.0–10.5)

## 2017-06-25 LAB — LIPID PANEL
CHOLESTEROL: 198 mg/dL (ref 0–200)
HDL: 35 mg/dL — ABNORMAL LOW (ref >40)
LDL Cholesterol: 126 mg/dL — ABNORMAL HIGH (ref 0–99)
Total CHOL/HDL Ratio: 5.7 RATIO
Triglycerides: 183 mg/dL — ABNORMAL HIGH (ref <150)
VLDL: 37 mg/dL (ref 0–40)

## 2017-06-25 LAB — HEMOGLOBIN A1C
HEMOGLOBIN A1C: 5.5 % (ref 4.8–5.6)
MEAN PLASMA GLUCOSE: 111.15 mg/dL

## 2017-06-25 NOTE — Progress Notes (Signed)
Recreation Therapy Notes  Date: 06/25/17 Time: 1000 Location: 500 Hall Dayroom  Group Topic: Wellness  Goal Area(s) Addresses:  Patient will define components of whole wellness. Patient will verbalize benefit of whole wellness.  Behavioral Response: Engaged  Intervention:  2 Decks of Cards  Activity: Deck of Chance.  Patients were given 2 cards from one deck of cards.  LRT would pull a card from another deck of cards.  Whatever card LRT pulled, patients with the corresponding card would have to complete the associated exercise.  Education: Wellness, Dentist.   Education Outcome: Acknowledges education/In group clarification offered/Needs additional education.   Clinical Observations/Feedback: Pt was bright and active during group.  Pt completed his exercises and exercises he didn't have to complete.  Pt was pleasant and engaged during group.  Pt expressed doing exercises "get your endorphins going and gets your mind working".   Victorino Sparrow, LRT/CTRS        Victorino Sparrow A 06/25/2017 11:49 AM

## 2017-06-25 NOTE — Progress Notes (Signed)
Adult Psychoeducational Group Note  Date:  06/25/2017 Time:  8:30 PM  Group Topic/Focus:  Wrap-Up Group:   The focus of this group is to help patients review their daily goal of treatment and discuss progress on daily workbooks.  Participation Level:  Active  Participation Quality:  Appropriate  Affect:  Appropriate  Cognitive:  Alert  Insight: Appropriate  Engagement in Group:  Engaged  Modes of Intervention:  Discussion  Additional Comments:  Patient rated his say an 8. Patient's goal(s) for today was to (1) attend all groups, (2) take medication, (3) stay awake until bed time.   [name] L [name] 06/25/2017, 8:30 PM

## 2017-06-25 NOTE — Progress Notes (Signed)
[facility] 1:1 Observation Documentation  For the first (8) hours following discontinuation of 1:1 precautions, a progress note entry by nursing staff should be documented at least every 2 hours, reflecting the patient's behavior, condition, mood, and conversation.  Use the progress notes for additional entries.  Time 1:1 discontinued:  9:15 am   Patient's Behavior:  Patient is calm and cooperative.  Patient has not engaged in any disruptive behaviors.   Patient's Condition:  Patient calm engaged with peers able to follow staff redirection   Patient's Conversation:  Patient was noted to be engaged in conversation with peers and staff in the dayroom.  Patient was discussing plans for discharge.   Clarita Crane

## 2017-06-25 NOTE — Progress Notes (Signed)
[facility] 1:1 Observation Documentation  For the first (8) hours following discontinuation of 1:1 precautions, a progress note entry by nursing staff should be documented at least every 2 hours, reflecting the patient's behavior, condition, mood, and conversation.  Use the progress notes for additional entries.  Time 1:1 discontinued:  0915  Patient's Behavior:  Patient is free of all inappropriate behaviors.  Patient has required redirection but has been able to follow staff direction with only 1 prompt.  Patient has had no outbursts.    Patient's Condition:  Patient free of SI, HI and AVH.  Patient able to [facility] safety.   Patient's Conversation:  Patient did not Administrator, arts in conversation.   Clarita Crane 06/25/2017, 10:54 AM

## 2017-06-25 NOTE — Progress Notes (Signed)
Closed Observation: Patient on closed observation for safety. Patient in his room showering. Routine safety checks continues. Patient safe on the unit with supervision.

## 2017-06-25 NOTE — Progress Notes (Signed)
Pt continues to be off close Observation. Pt has no complaints and is in no current distress. Will continue to monitor.

## 2017-06-25 NOTE — Tx Team (Signed)
Interdisciplinary Treatment and Diagnostic Plan Update  06/25/2017 Time of Session: 9:00 AM  [name] MRN: [id number]  Principal Diagnosis: Schizoaffective disorder, bipolar type ([place])  Secondary Diagnoses: Principal Problem:   Schizoaffective disorder, bipolar type ([place])   Current Medications:  Current Facility-Administered Medications  Medication Dose Route Frequency Provider Last Rate Last Dose  . acetaminophen (TYLENOL) tablet 650 mg  650 mg Oral Q6H PRN Okonkwo, Justina A, NP   650 mg at 06/25/17 0916  . alum & mag hydroxide-simeth (MAALOX/MYLANTA) 200-200-20 MG/5ML suspension 30 mL  30 mL Oral Q4H PRN Okonkwo, Justina A, NP   30 mL at 06/22/17 0457  . amLODipine (NORVASC) tablet 5 mg  5 mg Oral Daily Aundra Dubin, MD   5 mg at 06/25/17 0744  . benztropine (COGENTIN) tablet 0.5 mg  0.5 mg Oral BID PRN Cobos, Myer Peer, MD      . lidocaine (LIDODERM) 5 % 1 patch  1 patch Transdermal Q24H Cobos, Myer Peer, MD   1 patch at 06/23/17 2002  . LORazepam (ATIVAN) tablet 1 mg  1 mg Oral Q6H PRN Cobos, Myer Peer, MD   1 mg at 06/25/17 0916  . magnesium hydroxide (MILK OF MAGNESIA) suspension 30 mL  30 mL Oral Daily PRN Okonkwo, Justina A, NP   30 mL at 06/25/17 0745  . methocarbamol (ROBAXIN) tablet 500 mg  500 mg Oral Q8H PRN Derrill Center, NP   500 mg at 06/24/17 1643  . multivitamin with minerals tablet 1 tablet  1 tablet Oral Daily Cobos, Myer Peer, MD   1 tablet at 06/18/17 0900  . paliperidone (INVEGA) 24 hr tablet 9 mg  9 mg Oral QHS Cobos, Myer Peer, MD   9 mg at 06/24/17 2241  . SUMAtriptan (IMITREX) tablet 25 mg  25 mg Oral Daily PRN Aundra Dubin, MD   25 mg at 06/23/17 2046  . thiamine (B-1) injection 100 mg  100 mg Intramuscular Once Derrill Center, NP      . thiamine (VITAMIN B-1) tablet 100 mg  100 mg Oral Daily Derrill Center, NP        PTA Medications: Prescriptions Prior to Admission  Medication Sig Dispense Refill Last Dose  . clonazePAM  (KLONOPIN) 1 MG tablet Take 1-2 mg by mouth 2 (two) times daily as needed for anxiety.   2 unknown  . doxepin (SINEQUAN) 50 MG capsule Take 100 mg by mouth at bedtime.   unknown  . [START ON 08/19/2017] HYDROcodone-acetaminophen (NORCO) 7.5-325 MG tablet Take 1 tablet by mouth 2 (two) times daily as needed for moderate pain.    unknown  . traMADol (ULTRAM) 50 MG tablet Take 50 mg by mouth 4 (four) times daily.    unknown    Patient Stressors: Marital or family conflict Medication change or noncompliance  Patient Strengths: Capable of independent living Curator fund of knowledge Motivation for treatment/growth  Treatment Modalities: Medication Management, Group therapy, Case management,  1 to 1 session with clinician, Psychoeducation, Recreational therapy.   Physician Treatment Plan for Primary Diagnosis: Schizoaffective disorder, bipolar type ([place]) Long Term Goal(s): Improvement in symptoms so as ready for discharge  Short Term Goals: Ability to identify changes in lifestyle to reduce recurrence of condition will improve Ability to verbalize feelings will improve Ability to identify and develop effective coping behaviors will improve Ability to maintain clinical measurements within normal limits will improve Ability to verbalize feelings will improve Ability to demonstrate self-control will improve  Compliance with prescribed medications will improve  Medication Management: Evaluate patient's response, side effects, and tolerance of medication regimen.  Therapeutic Interventions: 1 to 1 sessions, Unit Group sessions and Medication administration.  Evaluation of Outcomes: Progressing   10/18:  Thus far tolerating Invega well . States Seroquel has helped him sleep in the past, agrees to low dose Seroquel in order to help address residual symptoms and insomnia . . *Of note, Seroquel is listed as an allergy. I have carefully reviewed this with patient, states he has  had no allergic reactions to Seroquel and has taken it in the past without concerns.  Physician Treatment Plan for Secondary Diagnosis: Principal Problem:   Schizoaffective disorder, bipolar type ([place])   Long Term Goal(s): Improvement in symptoms so as ready for discharge  Short Term Goals: Ability to identify changes in lifestyle to reduce recurrence of condition will improve Ability to verbalize feelings will improve Ability to identify and develop effective coping behaviors will improve Ability to maintain clinical measurements within normal limits will improve Ability to verbalize feelings will improve Ability to demonstrate self-control will improve Compliance with prescribed medications will improve  Medication Management: Evaluate patient's response, side effects, and tolerance of medication regimen.  Therapeutic Interventions: 1 to 1 sessions, Unit Group sessions and Medication administration.  Evaluation of Outcomes: Progressing   RN Treatment Plan for Primary Diagnosis: Schizoaffective disorder, bipolar type ([place]) Long Term Goal(s): Knowledge of disease and therapeutic regimen to maintain health will improve  Short Term Goals: Ability to identify and develop effective coping behaviors will improve and Compliance with prescribed medications will improve  Medication Management: RN will administer medications as ordered by provider, will assess and evaluate patient's response and provide education to patient for prescribed medication. RN will report any adverse and/or side effects to prescribing provider.  Therapeutic Interventions: 1 on 1 counseling sessions, Psychoeducation, Medication administration, Evaluate responses to treatment, Monitor vital signs and CBGs as ordered, Perform/monitor CIWA, COWS, AIMS and Fall Risk screenings as ordered, Perform wound care treatments as ordered.  Evaluation of Outcomes: Progressing   LCSW Treatment Plan for Primary Diagnosis:  Schizoaffective disorder, bipolar type ([place]) Long Term Goal(s): Safe transition to appropriate next level of care at discharge, Engage patient in therapeutic group addressing interpersonal concerns.  Short Term Goals: Engage patient in aftercare planning with referrals and resources  Therapeutic Interventions: Assess for all discharge needs, 1 to 1 time with Social worker, Explore available resources and support systems, Assess for adequacy in community support network, Educate family and significant other(s) on suicide prevention, Complete Psychosocial Assessment, Interpersonal group therapy.  Evaluation of Outcomes: Met  Return home, follow up [facility]   Progress in Treatment: Attending groups: Yes Participating in groups: Yes Taking medication as prescribed: Yes Toleration medication: Yes, no side effects reported at this time Family/Significant other contact made: No; patient refused consent for collateral contact; also cannot provide contact information  Patient understands diagnosis: Yes AEB asking for help with voices Discussing patient identified problems/goals with staff: Yes Medical problems stabilized or resolved: Yes Denies suicidal/homicidal ideation: Yes Issues/concerns per patient self-inventory: None Other: N/A  New problem(s) identified: Incidents of aggression on unit have led to property destruction, threats towards staff  New Short Term/Long Term Goal(s): "These voices make me feel like killing myself."  Discharge Plan or Barriers: return home, follow up w [facility]  Reason for Continuation of Hospitalization: Depression Hallucinations  Medication stabilization    Estimated Length of Stay: 1-3 days,   Attendees:  Patient:  06/25/2017  11:15 AM  Physician: Maris Berger MD 06/25/2017  11:15 AM  Nursing: Sena Hitch, RN; Opal Sidles RN 06/25/2017  11:15 AM  RN Care Manager: Lars Pinks, RN 06/25/2017  11:15 AM  Social Worker: Ripley Fraise LCSW 06/25/2017  11:15 AM  Recreational Therapist: Winfield Cunas 06/25/2017  11:15 AM  Other: Norberto Sorenson 06/25/2017  11:15 AM  Other:  06/25/2017  11:15 AM    Scribe for Treatment Team:  Edwyna Shell LCSW 06/25/2017 11:15 AM

## 2017-06-25 NOTE — Progress Notes (Signed)
Patient ID: [name], male   DOB: [birth year], 46 y.o.   MRN: [id number]   [facility] MD Progress Note  06/25/2017 3:51 PM [name]  MRN:  [id number] Subjective:  Patient reports he came to the hospital because he "started hearing voices and couldn't fight them off, so I decided to take an OD." He reports that today he is feeling "better" and he indicates having gradual improvement with each day at the hospital. He notes that his sleep is improved, but he has been sleeping in the evening prior to bed time, which limits the quality of his sleep overnight. He reports that AH have continued while at the hospital but he denies current SI/HI/AH/VH at time of evaluation. He states that Lorayne Bender has been helpful for him. He feels that his sleep has improved as well. He agrees to continue his current treatment regimen without changes.  Objective: I have discussed case with treatment team and have met with patient. As discussed with staff patient continues to present with some irritability but it is significantly improved. He is no longer pacing hallway, and reporting poor sleep. Staff has reported improvement in his ability to respond to redirection. He has indicated that he would be able to maintain his safety outside the hospital. He is less somatically focused today and did not request any changes to his pain treatment regimen.     Principal Problem: Schizoaffective disorder, bipolar type ([place]) Diagnosis:   Patient Active Problem List   Diagnosis Date Noted  . Hallucinations [R44.3] 07/29/2014  . Hypokalemia [E87.6] 04/28/2014  . Drug overdose [T50.901A] 04/27/2014  . Schizoaffective disorder, bipolar type ([place]) [F25.0] 03/02/2014  . Anxiety state [F41.1] 03/02/2014  . Benzodiazepine dependence ([place]) [F13.20] 03/02/2014  . Chest pain [R07.9] 10/11/2013  . EKG abnormalities [R94.31] 10/11/2013  . Chronic back pain [M54.9, G89.29]    Total Time spent with patient: 20 minutes  Past Psychiatric History:    Past Medical History:  Past Medical History:  Diagnosis Date  . Asthma   . CHF (congestive heart failure) ([place])   . Chronic back pain   . COPD (chronic obstructive pulmonary disease) ([place])   . Knee pain, chronic   . Migraines   . Schizophrenia, schizo-affective ([place])     Past Surgical History:  Procedure Laterality Date  . extraction of wisdom teeth    . KNEE SURGERY     four times   Family History: History reviewed. No pertinent family history. Family Psychiatric  History: see H&P Social History:  History  Alcohol Use No    Comment: occ     History  Drug Use No    Social History   Social History  . Marital status: Single    Spouse name: N/A  . Number of children: N/A  . Years of education: N/A   Social History Main Topics  . Smoking status: Current Every Day Smoker    Packs/day: 3.00    Years: 10.00    Types: Cigarettes    Last attempt to quit: 02/26/2014  . Smokeless tobacco: Never Used  . Alcohol use No     Comment: occ  . Drug use: No  . Sexual activity: No   Other Topics Concern  . None   Social History Narrative  . None   Additional Social History:   Sleep: Poor  Appetite:  Fair  Current Medications: Current Facility-Administered Medications  Medication Dose Route Frequency Provider Last Rate Last Dose  . acetaminophen (TYLENOL) tablet 650 mg  650 mg Oral Q6H PRN Lu Duffel, Justina A, NP   650 mg at 06/25/17 0916  . alum & mag hydroxide-simeth (MAALOX/MYLANTA) 200-200-20 MG/5ML suspension 30 mL  30 mL Oral Q4H PRN Okonkwo, Justina A, NP   30 mL at 06/22/17 0457  . amLODipine (NORVASC) tablet 5 mg  5 mg Oral Daily Aundra Dubin, MD   5 mg at 06/25/17 0744  . benztropine (COGENTIN) tablet 0.5 mg  0.5 mg Oral BID PRN Cobos, Myer Peer, MD      . lidocaine (LIDODERM) 5 % 1 patch  1 patch Transdermal Q24H Cobos, Myer Peer, MD   1 patch at 06/23/17 2002  . LORazepam (ATIVAN) tablet 1 mg  1 mg Oral Q6H PRN Cobos, Myer Peer, MD   1 mg at  06/25/17 0916  . magnesium hydroxide (MILK OF MAGNESIA) suspension 30 mL  30 mL Oral Daily PRN Okonkwo, Justina A, NP   30 mL at 06/25/17 0745  . methocarbamol (ROBAXIN) tablet 500 mg  500 mg Oral Q8H PRN Derrill Center, NP   500 mg at 06/24/17 1643  . multivitamin with minerals tablet 1 tablet  1 tablet Oral Daily Cobos, Myer Peer, MD   1 tablet at 06/18/17 0900  . paliperidone (INVEGA) 24 hr tablet 9 mg  9 mg Oral QHS Cobos, Myer Peer, MD   9 mg at 06/24/17 2241  . SUMAtriptan (IMITREX) tablet 25 mg  25 mg Oral Daily PRN Aundra Dubin, MD   25 mg at 06/23/17 2046  . thiamine (B-1) injection 100 mg  100 mg Intramuscular Once Derrill Center, NP      . thiamine (VITAMIN B-1) tablet 100 mg  100 mg Oral Daily Derrill Center, NP        Lab Results:  Results for orders placed or performed during the hospital encounter of 06/16/17 (from the past 48 hour(s))  Hemoglobin A1c     Status: None   Collection Time: 06/25/17  6:41 AM  Result Value Ref Range   Hgb A1c MFr Bld 5.5 4.8 - 5.6 %    Comment: (NOTE) Pre diabetes:          5.7%-6.4% Diabetes:              >6.4% Glycemic control for   <7.0% adults with diabetes    Mean Plasma Glucose 111.15 mg/dL    Comment: Performed at [facility], [place] [address] [place], [place] [address]  Lipid panel     Status: Abnormal   Collection Time: 06/25/17  6:41 AM  Result Value Ref Range   Cholesterol 198 0 - 200 mg/dL   Triglycerides 183 (H) <150 mg/dL   HDL 35 (L) >40 mg/dL   Total CHOL/HDL Ratio 5.7 RATIO   VLDL 37 0 - 40 mg/dL   LDL Cholesterol 126 (H) 0 - 99 mg/dL    Comment:        Total Cholesterol/HDL:CHD Risk Coronary Heart Disease Risk Table                     Men   Women  1/2 Average Risk   3.4   3.3  Average Risk       5.0   4.4  2 X Average Risk   9.6   7.1  3 X Average Risk  23.4   11.0        Use the calculated Patient Ratio above and the CHD Risk Table to  determine the patient's CHD Risk.        ATP III  CLASSIFICATION (LDL):  <100     mg/dL   Optimal  100-129  mg/dL   Near or Above                    Optimal  130-159  mg/dL   Borderline  160-189  mg/dL   High  >190     mg/dL   Very High Performed at [facility] [address], [place], [place] [address]   CBC with Differential/Platelet     Status: None   Collection Time: 06/25/17  6:41 AM  Result Value Ref Range   WBC 7.4 4.0 - 10.5 K/uL   RBC 5.63 4.22 - 5.81 MIL/uL   Hemoglobin 16.6 13.0 - 17.0 g/dL   HCT 48.5 39.0 - 52.0 %   MCV 86.1 78.0 - 100.0 fL   MCH 29.5 26.0 - 34.0 pg   MCHC 34.2 30.0 - 36.0 g/dL   RDW 12.8 11.5 - 15.5 %   Platelets 247 150 - 400 K/uL   Neutrophils Relative % 57 %   Neutro Abs 4.3 1.7 - 7.7 K/uL   Lymphocytes Relative 33 %   Lymphs Abs 2.5 0.7 - 4.0 K/uL   Monocytes Relative 8 %   Monocytes Absolute 0.6 0.1 - 1.0 K/uL   Eosinophils Relative 1 %   Eosinophils Absolute 0.0 0.0 - 0.7 K/uL   Basophils Relative 1 %   Basophils Absolute 0.1 0.0 - 0.1 K/uL    Comment: Performed at [facility], [place] [address] [place], [place] [address]    Blood Alcohol level:  Lab Results  Component Value Date   [facility] <10 06/14/2017   ETH <5 29/47/6546    Metabolic Disorder Labs: Lab Results  Component Value Date   HGBA1C 5.5 06/25/2017   MPG 111.15 06/25/2017   No results found for: PROLACTIN Lab Results  Component Value Date   CHOL 198 06/25/2017   TRIG 183 (H) 06/25/2017   HDL 35 (L) 06/25/2017   CHOLHDL 5.7 06/25/2017   VLDL 37 06/25/2017   LDLCALC 126 (H) 06/25/2017    Physical Findings: AIMS: Facial and Oral Movements Muscles of Facial Expression: None, normal Lips and Perioral Area: None, normal Jaw: None, normal Tongue: None, normal,Extremity Movements Upper (arms, wrists, hands, fingers): None, normal Lower (legs, knees, ankles, toes): None, normal, Trunk Movements Neck, shoulders, hips: None, normal, Overall Severity Severity of abnormal movements (highest  score from questions above): None, normal Incapacitation due to abnormal movements: None, normal Patient's awareness of abnormal movements (rate only patient's report): No Awareness, Dental Status Current problems with teeth and/or dentures?: No Does patient usually wear dentures?: No  CIWA:  CIWA-Ar Total: 0 COWS:  COWS Total Score: 3  Musculoskeletal: Strength & Muscle Tone: within normal limits Gait & Station: normal Patient leans: N/A  Psychiatric Specialty Exam: Physical Exam  Nursing note and vitals reviewed. Neurological: He is alert.    Review of Systems  Respiratory: Negative for cough.   Cardiovascular: Negative for chest pain.  Gastrointestinal: Negative for heartburn, nausea and vomiting.  Musculoskeletal: Positive for back pain and joint pain.  Psychiatric/Behavioral: Positive for depression. Negative for hallucinations. The patient is not nervous/anxious.     Blood pressure 121/80, pulse (!) 109, temperature 98.6 F (37 C), temperature source Oral, resp. rate 16, height _0  (1.854 m), weight 99.3 kg (219 lb), SpO2 100 %.Body mass index is  28.89 kg/m.  General Appearance: Fairly Groomed  Eye Contact:  Good  Speech:  normal rate, tone, volume, latency   Volume:  Normal   Mood:  Euthymic  Affect:  Congruent and Flat  Thought Process:  Linear and Descriptions of Associations: Intact-    Orientation:  Other:  fully alert and attentive   Thought Content:  Hallucinations: Auditory- not internally preoccupied at this time, denies hallucinations at time of evaluation  Suicidal Thoughts:  No denies suicidal plan or intention, also denies homicidal or violent ideations  Homicidal Thoughts:  No  Memory:  Immediate;   Fair Recent;   Fair Remote;   Fair  Judgement:  Fair- gradually improving   Insight:  Fair  Psychomotor Activity:  Normal  Concentration:  Concentration: improving  and Attention Span: improving   Recall:  [employer] of Knowledge:  Fair  Language:   Good  Akathisia:  No  Handed:  Right  AIMS (if indicated):     Assets:  Communication Skills Resilience Social Support  ADL's:  Intact  Cognition:  WNL  Sleep:  Number of Hours: 3.25    Assessment - patient continues to demonstrate improvement in mood and psychotic symptoms. He is incrementally less irritable and less dysphoric. He is has less psychomotor activation in regards to pacing. He is pleasant and cooperative with the interview. He notes decrease in AH. His sleep pattern appears to be somewhat phase-shifted earlier in the evening as he is sleeping before bedtime, but he notes feeling adequately rested. He is tolerating Invega well .We discussed treatment options and pt indicated that he feels nearly ready for discharge. Pt had been restarted on seroquel (which is listed as an allergy) and he has been tolerating it without difficulty.  Treatment Plan Summary: Daily contact with patient to assess and evaluate symptoms and progress in treatment and Medication management  Treatment plan reviewed today 10/18 -Encourage group and milieu participation to work on coping skills and symptom reduction -Continue Cogentin 0.5 mg PRN for EPS. -Continue Invega 9 mg  QHS for mood stabilization, psychosis -Continue Seroquel 25 mgrs QHS PRN for insomnia -Continue Lidoderm patch for back pain  -Continue Hydroxyzine 25 mg prn for anxiety Q 6 hours and 50 mgrs QHS PRN for insomnia  -Continue  Ativan 1 mg tablet Q 6 hours PRN for anxiety  -Treatment team working on disposition planning  -Repeat EKG to monitor QTc and CBC , routine as recently started on Invega trial . Pennelope Bracken, MD 06/25/2017, 3:51 PM   Patient ID: [name], male   DOB: 1971/08/01, 46 y.o.   MRN: [id number]

## 2017-06-25 NOTE — [facility] Group Notes (Signed)
LCSW Group Therapy 06/25/2017 1:15pm  Type of Therapy and Topic:  Group Therapy:  Change and Accountability  Participation Level:  Active  Description of Group In this group, patients discussed power and accountability for change.  The group identified the challenges related to accountability and the difficulty of accepting the outcomes of negative behaviors.  Patients were encouraged to openly discuss a challenge/change they could take responsibility for.  Patients discussed the use of "change talk" and positive thinking as ways to support achievement of personal goals.  The group discussed ways to give support and empowerment to peers.  Therapeutic Goals: 1. Patients will state the relationship between personal power and accountability in the change process 2. Patients will identify the positive and negative consequences of a personal choice they have made 3. Patients will identify one challenge/choice they will take responsibility for making 4. Patients will discuss the role of "change talk" and the impact of positive thinking as it supports successful personal change 5. Patients will verbalize support and affirmation of change efforts in peers  Summary of Patient Progress:  Mali attended group but he was in and out of the room.  He was an active participant. He shared that the one thing he knows about his recovery is that he should not have punched the wall the other night.  He also stated that he knows he should try to remain cool, calm and collected when he gets upset.    Therapeutic Modalities Solution Focused Brief Therapy Motivational Interviewing Cognitive Behavioral Therapy  Riley Lam Work 06/25/2017 1:06 PM

## 2017-06-25 NOTE — Progress Notes (Signed)
[facility] 1:1 Observation Documentation  For the first (8) hours following discontinuation of 1:1 precautions, a progress note entry by nursing staff should be documented at least every 2 hours, reflecting the patient's behavior, condition, mood, and conversation.  Use the progress notes for additional entries.  Time 1:1 discontinued:  915 am   Patient's Behavior:  Patient has been free of all inappropriate behaviors, needed minimal redirection and able to follow staff direction.   Patient's Condition:  Patient denies SI, HI and AVH at this time.   Patient's Conversation:  Patient discussed discharge planning and had multiple medical requests.   Clarita Crane 06/25/2017,

## 2017-06-25 NOTE — Progress Notes (Signed)
Nursing Progress Note: 7p-7a D: Pt currently presents with a improved/pleasant affect and behavior. Pt states "I stayed out of bed today. I did what I was supposed to do." Interacting appropriately with the milieu. Pt reports good sleep during the previous night with current medication regimen. Pt did attend wrap-up group.  A: Pt provided with medications per providers orders. Pt's labs and vitals were monitored throughout the night. Pt supported emotionally and encouraged to express concerns and questions. Pt educated on medications.  R: Pt's safety ensured with 15 minute and environmental checks. Pt currently denies SI, HI, and AVH. Pt verbally contracts to seek staff if SI,HI, or AVH occurs and to consult with staff before acting on any harmful thoughts. Will continue to monitor.

## 2017-06-25 NOTE — Progress Notes (Signed)
Closed Observation: Patient on closed observation for safety. Patient in his room sleeping. Routine safety checks continues. Patient safe on the unit with supervision.

## 2017-06-25 NOTE — Progress Notes (Signed)
[facility] 1:1 Observation Documentation  For the first (8) hours following discontinuation of 1:1 precautions, a progress note entry by nursing staff should be documented at least every 2 hours, reflecting the patient's behavior, condition, mood, and conversation.  Use the progress notes for additional entries.  Time 1:1 discontinued:  9:15 am   Patient's Behavior:  Patient calm and cooperative, patient has been free of all behavioral dyscontrol    Patient's Condition:  Patient denies SI, HI and AVH   Patient's Conversation:  Patient states that he wants to be able to discharge soon and hopes to go home   [employer]

## 2017-06-25 NOTE — Progress Notes (Signed)
[facility] 1:1 Observation Documentation  For the first (8) hours following discontinuation of 1:1 precautions, a progress note entry by nursing staff should be documented at least every 2 hours, reflecting the patient's behavior, condition, mood, and conversation.  Use the progress notes for additional entries.  Time 1:1 discontinued:  9:15 am   Patient's Behavior:  Patient is calm and cooperative.  Patient has not engaged in any disruptive behaviors.   Patient's Condition:  Patient calm engaged with peers able to follow staff redirection   Patient's Conversation:  Patient was noted to be engaged in conversation with peers and staff in the dayroom.  Patient was discussing plans for discharge.   Clarita [place] 06/25/2017, 1115 am

## 2017-06-26 LAB — PROLACTIN: Prolactin: 27.2 ng/mL — ABNORMAL HIGH (ref 4.0–15.2)

## 2017-06-26 MED ORDER — AMLODIPINE BESYLATE 5 MG PO TABS: 5.0000 mg | ORAL_TABLET | Freq: Every day | ORAL | 0 refills | Status: DC

## 2017-06-26 MED ORDER — LORAZEPAM 1 MG PO TABS: 1.0000 mg | ORAL_TABLET | Freq: Four times a day (QID) | ORAL | 0 refills | Status: DC | PRN

## 2017-06-26 MED ORDER — LIDOCAINE 5 % EX PTCH: 1.0000 | MEDICATED_PATCH | CUTANEOUS | 0 refills | Status: DC

## 2017-06-26 MED ORDER — PALIPERIDONE ER 9 MG PO TB24: 9.0000 mg | ORAL_TABLET | Freq: Every day | ORAL | 0 refills | Status: DC

## 2017-06-26 MED ORDER — BENZTROPINE MESYLATE 0.5 MG PO TABS: 0.5000 mg | ORAL_TABLET | Freq: Two times a day (BID) | ORAL | 0 refills | Status: DC | PRN

## 2017-06-26 NOTE — Progress Notes (Signed)
Patient ID: [name], male   DOB: 10-17-1970, 46 y.o.   MRN: [id number] Patient discharged to home/self care.  Patient denies SI, HI and AVH upon discharged.  Patient acknowledged receipt of all belongings and understanding of all discharge instructions.

## 2017-06-26 NOTE — Progress Notes (Signed)
Recreation Therapy Notes  Date: 06/26/17 Time: 1000 Location: 500 Hall Dayroom  Group Topic: Leisure Education  Goal Area(s) Addresses:  Patient will identify positive leisure activities.  Patient will identify one positive benefit of participation in leisure activities.   Behavioral Response: Engaged  Intervention: Chairs, small beach ball  Activity: Keep It Chartered certified accountant.  LRT seated patients in a circle.  Patients were to toss the ball back and fourth to each other without letting the ball come to a stop.  Patients could bounce the ball off the floor but the wall was to remain moving at all times.  LRT would count the number of hits on the ball.  If the ball stopped the count would start over.  Education:  Leisure Education, Dentist  Education Outcome: Acknowledges education/In group clarification offered/Needs additional education  Clinical Observations/Feedback: Pt was active and social in group.  Pt was pleasant and engaged throughout the activity.   Victorino Sparrow, LRT/CTRS        Victorino Sparrow A 06/26/2017 12:04 PM

## 2017-06-26 NOTE — Discharge Summary (Signed)
Physician Discharge Summary Note  Patient:  [name] is an 46 y.o., male MRN:  [id number] DOB:  12-Jan-1971 Patient phone:  [phone] (home)  Patient address:   [address] [place] [address],   Total Time spent with patient: Greater than 30 minutes  Date of Admission:  06/16/2017  Date of Discharge: 06-26-17  Reason for Admission: Suicide attempt by intentional drug overdose.  Principal Problem: Schizoaffective disorder, bipolar type [facility])  Discharge Diagnoses: Patient Active Problem List   Diagnosis Date Noted  . Hallucinations [R44.3] 07/29/2014  . Hypokalemia [E87.6] 04/28/2014  . Drug overdose [T50.901A] 04/27/2014  . Schizoaffective disorder, bipolar type ([place]) [F25.0] 03/02/2014  . Anxiety state [F41.1] 03/02/2014  . Benzodiazepine dependence ([place]) [F13.20] 03/02/2014  . Chest pain [R07.9] 10/11/2013  . EKG abnormalities [R94.31] 10/11/2013  . Chronic back pain [M54.9, G89.29]    Past Psychiatric History: Schizoaffective disorder, Bipolar-type.  Past Medical History:  Past Medical History:  Diagnosis Date  . Asthma   . CHF (congestive heart failure) ([place])   . Chronic back pain   . COPD (chronic obstructive pulmonary disease) ([place])   . Knee pain, chronic   . Migraines   . Schizophrenia, schizo-affective ([place])     Past Surgical History:  Procedure Laterality Date  . extraction of wisdom teeth    . KNEE SURGERY     four times   Family History: History reviewed. No pertinent family history.  Family Psychiatric  History: See H&P  Social History:  History  Alcohol Use No    Comment: occ     History  Drug Use No    Social History   Social History  . Marital status: Single    Spouse name: N/A  . Number of children: N/A  . Years of education: N/A   Social History Main Topics  . Smoking status: Current Every Day Smoker    Packs/day: 3.00    Years: 10.00    Types: Cigarettes    Last attempt to quit: 02/26/2014  . Smokeless  tobacco: Never Used  . Alcohol use No     Comment: occ  . Drug use: No  . Sexual activity: No   Other Topics Concern  . None   Social History Narrative  . None   Hospital Course: [name] is a 46 years old male with the history of chronic schizophrenia admitted to [facility] for status post intentional drug overdose as a suicide attempt. Patient reportedly took whole Bottle of Seroquel because his auditory hallucinations telling him to kill himself. Patient reportedly driving to go to [facility] that nobody really find him when he died. Reportedly patient started feeling dizzy with blurred vision and stop the scar at roadside and some bystanders called the emergency medical services. EMS found him with the slurred speech and drowsiness and patient was sitting on the gross the side of the road not far from his vehicle. Patient reported he has been trying several different typical and atypical antipsychotic this course 46 years old and feeling tired of auditory hallucinations telling him to kill himself and does not contract for safety.   After the above admission assessment, it was determined that [name] will need mood stabilization treatments. After evaluation of his presenting symptoms, he was started on the medication regimen targeting those symptoms. He received & was discharged on; Cogentin 0.5 mg for EPS, Lorazepam 1 mg for severe anxiety & Paliperidone 9 mg for mood control. He presented other significant  pre-existing medical issues that required treatment. He was resumed & also discharged on all his pertinent home medications for those health issues. He tolerated his treatment regimen with out any adverse effects or reactions reported. [name] was also enrolled in the group counseling sessions being offered & held on this unit. He learned coping skills  [name] is seen today by the attending psychiatrist for discharge. He has normal anxiety about going home. He is not overwhelmed by  this. He is looking forward to working on his mental health issues. Not expressing any delusions today. No hallucination. Feels in control of himself. No passivity of thought. No passivity of will. No fantasy about suicide lately. No suicidal thoughts. Looking forward to going home to his family. No thoughts of violence. Does not feel depressed. No evidence of mania.  The nursing staff reports that patient has been appropriate on the unit. Patient has been interacting well with peers. No behavioral issues. Patient has not voiced any suicidal thoughts. Patient has not been observed to be internally stimulated or preoccupied. Patient has been adherent with his treatment recommendations. Patient has been tolerating his medications well. No reported adverse effects or reactions.   Patient was discussed at the treatment team meeting this morning. The team members feel that patient is back to his baseline level of function. Team agrees with plan to discharge patient today to continue mental health care on an outpatient basis as noted below. He was provided with all the necessary information needed to make this appointment without problems. He left [facility] with all personal belongings in no apparent distress. Transportation per family.     Physical Findings: AIMS: Facial and Oral Movements Muscles of Facial Expression: None, normal Lips and Perioral Area: None, normal Jaw: None, normal Tongue: None, normal,Extremity Movements Upper (arms, wrists, hands, fingers): None, normal Lower (legs, knees, ankles, toes): None, normal, Trunk Movements Neck, shoulders, hips: None, normal, Overall Severity Severity of abnormal movements (highest score from questions above): None, normal Incapacitation due to abnormal movements: None, normal Patient's awareness of abnormal movements (rate only patient's report): No Awareness, Dental Status Current problems with teeth and/or dentures?: No Does patient usually wear  dentures?: No  CIWA:  CIWA-Ar Total: 0 COWS:  COWS Total Score: 3  Musculoskeletal: Strength & Muscle Tone: within normal limits Gait & Station: normal Patient leans: N/A  Psychiatric Specialty Exam: Physical Exam  Constitutional: He appears well-developed.  HENT:  Head: Normocephalic.  Eyes: Pupils are equal, round, and reactive to light.  Neck: Normal range of motion.  Cardiovascular:  Elevated pulse rate  Respiratory: Effort normal.  GI: Soft.  Genitourinary:  Genitourinary Comments: Deferred  Musculoskeletal: Normal range of motion.  Neurological: He is alert.    Review of Systems  Constitutional: Negative.   HENT: Negative.   Eyes: Negative.   Respiratory: Negative.   Cardiovascular: Negative.   Gastrointestinal: Negative.   Genitourinary: Negative.   Musculoskeletal: Negative.   Skin: Negative.   Neurological: Negative.   Endo/Heme/Allergies: Negative.   Psychiatric/Behavioral: Positive for depression (Stable) and hallucinations (Hx. Psychosis). Negative for memory loss, substance abuse and suicidal ideas. The patient has insomnia (Stable). The patient is not nervous/anxious.     Blood pressure 104/71, pulse (!) 101, temperature 98 F (36.7 C), resp. rate 16, height 6\' 1"  (1.854 m), weight 99.3 kg (219 lb), SpO2 100 %.Body mass index is 28.89 kg/m.  See Md's SRA   Have you used any form of tobacco in the last 30 days? (Cigarettes, Smokeless Tobacco,  Cigars, and/or Pipes): Yes  Has this patient used any form of tobacco in the last 30 days? (Cigarettes, Smokeless Tobacco, Cigars, and/or Pipes): No  Blood Alcohol level:  Lab Results  Component Value Date   ETH <10 06/14/2017   ETH <5 39/76/7341   Metabolic Disorder Labs:  Lab Results  Component Value Date   HGBA1C 5.5 06/25/2017   MPG 111.15 06/25/2017   Lab Results  Component Value Date   PROLACTIN 27.2 (H) 06/25/2017   Lab Results  Component Value Date   CHOL 198 06/25/2017   TRIG 183 (H)  06/25/2017   HDL 35 (L) 06/25/2017   CHOLHDL 5.7 06/25/2017   VLDL 37 06/25/2017   LDLCALC 126 (H) 06/25/2017   See Psychiatric Specialty Exam and Suicide Risk Assessment completed by Attending Physician prior to discharge.  Discharge destination:  Home  Is patient on multiple antipsychotic therapies at discharge:  No   Has Patient had three or more failed trials of antipsychotic monotherapy by history:  No  Recommended Plan for Multiple Antipsychotic Therapies: NA  Allergies as of 06/26/2017      Reactions   Amoxicillin Diarrhea   Has patient had a PCN reaction causing immediate rash, facial/tongue/throat swelling, SOB or lightheadedness with hypotension: No Has patient had a PCN reaction causing severe rash involving mucus membranes or skin necrosis: No Has patient had a PCN reaction that required hospitalization: No Has patient had a PCN reaction occurring within the last 10 years: No If all of the above answers are "NO", then may proceed with Cephalosporin use.   Dextromethorphan-guaifenesin Other (See Comments)   unspecified   Haloperidol Other (See Comments)   unspecified   Meloxicam Other (See Comments)   unspecified   Nsaids Other (See Comments)   Rectal bleeding   Prednisone Other (See Comments)   unspecified   Quetiapine Other (See Comments)   Psychosis with high dose (600mg )   Sudafed [pseudoephedrine Hcl] Other (See Comments)   Hallucinations   Ziprasidone Other (See Comments)   unspecified      Medication List    STOP taking these medications   clonazePAM 1 MG tablet Commonly known as:  KLONOPIN   doxepin 50 MG capsule Commonly known as:  SINEQUAN   HYDROcodone-acetaminophen 7.5-325 MG tablet Commonly known as:  NORCO   traMADol 50 MG tablet Commonly known as:  ULTRAM     TAKE these medications     Indication  amLODipine 5 MG tablet Commonly known as:  NORVASC Take 1 tablet (5 mg total) by mouth daily. For high blood pressure  Indication:   High Blood Pressure Disorder   benztropine 0.5 MG tablet Commonly known as:  COGENTIN Take 1 tablet (0.5 mg total) by mouth 2 (two) times daily as needed for tremors (EPS).  Indication:  Extrapyramidal Reaction caused by Medications   lidocaine 5 % Commonly known as:  LIDODERM Place 1 patch onto the skin daily. Remove & Discard patch within 12 hours or as directed by MD: For pain management  Indication:  Pain management   LORazepam 1 MG tablet Commonly known as:  ATIVAN Take 1 tablet (1 mg total) by mouth every 6 (six) hours as needed for anxiety.  Indication:  Feeling Anxious   paliperidone 9 MG 24 hr tablet Commonly known as:  INVEGA Take 1 tablet (9 mg total) by mouth at bedtime. For mood control  Indication:  Mood control      [place]  Follow up on 07/08/2017.   Specialty:  Behavioral Health Why:  Wednesday at 11:30 with Dr Reece Levy. Contact information: [place] [address] [phone]          Follow-up recommendations: Activity:  As tolerated Diet: As recommended by your primary care doctor. Keep all scheduled follow-up appointments as recommended.    Comments: Patient is instructed prior to discharge to: Take all medications as prescribed by his/her mental healthcare provider. Report any adverse effects and or reactions from the medicines to his/her outpatient provider promptly. Patient has been instructed & cautioned: To not engage in alcohol and or illegal drug use while on prescription medicines. In the event of worsening symptoms, patient is instructed to call the crisis hotline, 911 and or go to the nearest ED for appropriate evaluation and treatment of symptoms. To follow-up with his/her primary care provider for your other medical issues, concerns and or health care needs.   Signed: Encarnacion Slates, NP, PMHNP, FNP-BC 06/26/2017, 9:57 AM   Patient seen, Suicide  Assessment Completed.  Disposition Plan Reviewed   [name] is a 46 y/o M who presented with worsening AH and SI with plan to OD. He was admitted and started on Invega to address psychosis symptoms. Today upon evaluation, he reports improvement of mood symptoms and psychotic symptoms. He continues to endorse some AH while in the hospital, but they are not bothersome or intrusive. He describes one voice "having debates about going home," but pt remarks that he feels he is ready to discharge. He denies SI/HI/VH. He is able to engage in safety planning including returning to [facility] or contacting emergency services if he feels unable to maintain his own safety. He agrees to continue his current treatment regimen without changes and he plans to follow up with his established outpatient psychiatrist. He had no further questions, comments, or concerns.     Plan Of Care/Follow-up recommendations:   -Medications: Continue Invega 9mg  qhs, cogentin 0.5mg  BID - Outpatient follow up as per above  Activity:  as tolerated Diet:  normal Tests:  N/A Other:  see above for discharge treatment plan  Pennelope Bracken, MD

## 2017-06-26 NOTE — Progress Notes (Signed)
Pt continues to be off close Observation. Pt has no complaints and is in no current distress. Will continue to monitor.

## 2017-06-26 NOTE — Progress Notes (Signed)
  [facility] Adult Case Management Discharge Plan :  Will you be returning to the same living situation after discharge:  Yes,  home At discharge, do you have transportation home?: Yes,  family Do you have the ability to pay for your medications: Yes,  MCD  Release of information consent forms completed and in the chart;  Patient's signature needed at discharge.  Patient to Follow up at: Follow-up [place], [facility] Follow up on 07/08/2017.   Specialty:  Behavioral Health Why:  Wednesday at 11:30 with Dr Reece Levy. Contact information: [place] [address] [place] [address] [phone]           Next level of care provider has access to [place] and Suicide Prevention discussed: Yes,  yes  Have you used any form of tobacco in the last 30 days? (Cigarettes, Smokeless Tobacco, Cigars, and/or Pipes): Yes  Has patient been referred to the Quitline?: Patient refused referral  Patient has been referred for addiction treatment: [place], LCSW 06/26/2017, 7:26 AM

## 2017-06-26 NOTE — Progress Notes (Signed)
Recreation Therapy Notes  INPATIENT RECREATION TR PLAN  Patient Details Name: [name] MRN: [id number] DOB: 01/25/1971 Today's Date: 06/26/2017  Rec Therapy Plan Is patient appropriate for Therapeutic Recreation?: Yes Treatment times per week: about 3 days Estimated Length of Stay: 5-7 days TR Treatment/Interventions: Group participation (Comment)  Discharge Criteria Pt will be discharged from therapy if:: Discharged Treatment plan/goals/alternatives discussed and agreed upon by:: Patient/family  Discharge Summary Short term goals set: Patient will be able to identify at least 5 coping skills for anger by conclusion of recreation therapy tx  Short term goals met: Complete Progress toward goals comments: Groups attended Which groups?: Wellness, Coping skills, Leisure education, Goal setting, Other (Comment) (Team Building) Reason goals not met: None Therapeutic equipment acquired: N/A Reason patient discharged from therapy: Discharge from hospital Pt/family agrees with progress & goals achieved: Yes Date patient discharged from therapy: 06/26/17    , LRT/CTRS  ,  A 06/26/2017, 12:40 PM  

## 2017-06-26 NOTE — BHH Group Notes (Signed)
LCSW Group Therapy Note   06/26/2017 1:15pm   Type of Therapy and Topic:  Group Therapy:  Positive Affirmations   Participation Level:  Did Not Attend  Description of Group: This group addressed positive affirmation toward self and others. Patients went around the room and identified two positive things about themselves and two positive things about a peer in the room. Patients reflected on how it felt to share something positive with others, to identify positive things about themselves, and to hear positive things from others. Patients were encouraged to have a daily reflection of positive characteristics or circumstances.  Therapeutic Goals 1. Patient will verbalize two of their positive qualities 2. Patient will demonstrate empathy for others by stating two positive qualities about a peer in the group 3. Patient will verbalize their feelings when voicing positive self affirmations and when voicing positive affirmations of others 4. Patients will discuss the potential positive impact on their wellness/recovery of focusing on positive traits of self and others. Summary of Patient Progress:    Therapeutic Modalities Cognitive Behavioral Therapy Motivational Interviewing  Riley Lam Work 06/26/2017 2:24 PM

## 2017-06-26 NOTE — Plan of Care (Signed)
Problem: [facility] Participation in Recreation Therapeutic Interventions Goal: STG-Patient will identify at least five coping skills for ** STG: Coping Skills - Patient will be able to identify at least 5 coping skills for anger by conclusion of recreation therapy tx  Outcome: Completed/Met Date Met: 06/26/17 Pt was able to identify coping skills at completion of coping skills recreation therapy session.   Victorino Sparrow, LRT/CTRS

## 2017-06-26 NOTE — BHH Suicide Risk Assessment (Signed)
[facility] Discharge Suicide Risk Assessment   Principal Problem: Schizoaffective disorder, bipolar type [facility]) Discharge Diagnoses:  Patient Active Problem List   Diagnosis Date Noted  . Hallucinations [R44.3] 07/29/2014  . Hypokalemia [E87.6] 04/28/2014  . Drug overdose [T50.901A] 04/27/2014  . Schizoaffective disorder, bipolar type ([place]) [F25.0] 03/02/2014  . Anxiety state [F41.1] 03/02/2014  . Benzodiazepine dependence ([place]) [F13.20] 03/02/2014  . Chest pain [R07.9] 10/11/2013  . EKG abnormalities [R94.31] 10/11/2013  . Chronic back pain [M54.9, G89.29]     Total Time spent with patient: 30 minutes  Musculoskeletal: Strength & Muscle Tone: within normal limits Gait & Station: normal Patient leans: N/A  Psychiatric Specialty Exam: Review of Systems  Constitutional: Negative for chills and fever.  Cardiovascular: Negative for chest pain.  Gastrointestinal: Negative for heartburn and nausea.  Neurological: Negative for dizziness.    Blood pressure 104/71, pulse (!) 101, temperature 98 F (36.7 C), resp. rate 16, height 6\' 1"  (1.854 m), weight 99.3 kg (219 lb), SpO2 100 %.Body mass index is 28.89 kg/m.  General Appearance: Casual and Fairly Groomed  Engineer, water::  Good  Speech:  Clear and Coherent and Normal Rate  Volume:  Normal  Mood:  Euthymic  Affect:  Constricted and Flat  Thought Process:  Coherent  Orientation:  Full (Time, Place, and Person)  Thought Content:  Hallucinations: Auditory  Suicidal Thoughts:  No  Homicidal Thoughts:  No  Memory:  Immediate;   Fair Recent;   Fair Remote;   Fair  Judgement:  Fair  Insight:  Fair  Psychomotor Activity:  Normal  Concentration:  Fair  Recall:  [employer] of Knowledge:Fair  Language: Fair  Akathisia:  No  Handed:    AIMS (if indicated):     Assets:  Communication Skills Desire for Improvement  Sleep:  Number of Hours: 6.5  Cognition: WNL  ADL's:  Intact   Mental Status Per Nursing Assessment::   On Admission:   Suicidal ideation indicated by patient  Demographic Factors:  Male and Living alone  Loss Factors: Financial problems/change in socioeconomic status  Historical Factors: NA  Risk Reduction Factors:   Positive social support, Positive therapeutic relationship and Positive coping skills or problem solving skills  Continued Clinical Symptoms:  Schizophrenia:   Paranoid or undifferentiated type  Cognitive Features That Contribute To Risk:  None    Suicide Risk:  Mild:  Suicidal ideation of limited frequency, intensity, duration, and specificity.  There are no identifiable plans, no associated intent, mild dysphoria and related symptoms, good self-control (both objective and subjective assessment), few other risk factors, and identifiable protective factors, including available and accessible social support.  Follow-up [place], [facility] Follow up on 07/08/2017.   Specialty:  Behavioral Health Why:  Wednesday at 11:30 with Dr Reece Levy. Contact information: [place] [address] [phone]           Subjective data: [name] is a 46 y/o M who presented with worsening AH and SI with plan to OD. He was admitted and started on Invega to address psychosis symptoms. Today upon evaluation, he reports improvement of mood symptoms and psychotic symptoms. He continues to endorse some AH while in the hospital, but they are not bothersome or intrusive. He describes one voice "having debates about going home," but pt remarks that he feels he is ready to discharge. He denies SI/HI/VH. He is able to engage in safety planning including returning to [facility] or contacting  emergency services if he feels unable to maintain his own safety. He agrees to continue his current treatment regimen without changes and he plans to follow up with his established outpatient psychiatrist. He had no further questions, comments, or concerns.     Plan Of  Care/Follow-up recommendations:   -Medications: Continue Invega 9mg  qhs, cogentin 0.5mg  BID - Outpatient follow up as per above  Activity:  as tolerated Diet:  normal Tests:  N/A Other:  see above for discharge treatment plan  Pennelope Bracken, MD 06/26/2017, 9:33 AM

## 2017-08-18 ENCOUNTER — Emergency Department (HOSPITAL_COMMUNITY)
Admission: EM | Admit: 2017-08-18 | Discharge: 2017-08-19 | Disposition: A | Discharge status: Other Acute Inpatient Hospital | Payer: Medicare Other | Attending: Emergency Medicine | Admitting: Emergency Medicine

## 2017-08-18 ENCOUNTER — Encounter (HOSPITAL_COMMUNITY): Payer: Self-pay | Admitting: Emergency Medicine

## 2017-08-18 DIAGNOSIS — Z9114 Patient's other noncompliance with medication regimen: Secondary | ICD-10-CM | POA: Insufficient documentation

## 2017-08-18 DIAGNOSIS — Z79899 Other long term (current) drug therapy: Secondary | ICD-10-CM | POA: Insufficient documentation

## 2017-08-18 DIAGNOSIS — Z046 Encounter for general psychiatric examination, requested by authority: Secondary | ICD-10-CM | POA: Insufficient documentation

## 2017-08-18 DIAGNOSIS — R443 Hallucinations, unspecified: Secondary | ICD-10-CM | POA: Diagnosis present

## 2017-08-18 DIAGNOSIS — J45909 Unspecified asthma, uncomplicated: Secondary | ICD-10-CM | POA: Insufficient documentation

## 2017-08-18 DIAGNOSIS — F17210 Nicotine dependence, cigarettes, uncomplicated: Secondary | ICD-10-CM | POA: Insufficient documentation

## 2017-08-18 DIAGNOSIS — J449 Chronic obstructive pulmonary disease, unspecified: Secondary | ICD-10-CM | POA: Insufficient documentation

## 2017-08-18 DIAGNOSIS — R440 Auditory hallucinations: Secondary | ICD-10-CM | POA: Insufficient documentation

## 2017-08-18 DIAGNOSIS — F22 Delusional disorders: Secondary | ICD-10-CM | POA: Insufficient documentation

## 2017-08-18 DIAGNOSIS — I509 Heart failure, unspecified: Secondary | ICD-10-CM | POA: Insufficient documentation

## 2017-08-18 DIAGNOSIS — F250 Schizoaffective disorder, bipolar type: Secondary | ICD-10-CM | POA: Diagnosis present

## 2017-08-18 DIAGNOSIS — R45851 Suicidal ideations: Secondary | ICD-10-CM | POA: Insufficient documentation

## 2017-08-18 LAB — RAPID URINE DRUG SCREEN, HOSP PERFORMED
Amphetamines: NOT DETECTED
BARBITURATES: NOT DETECTED
Benzodiazepines: POSITIVE — AB
COCAINE: NOT DETECTED
OPIATES: NOT DETECTED
Tetrahydrocannabinol: NOT DETECTED

## 2017-08-18 LAB — COMPREHENSIVE METABOLIC PANEL
ALK PHOS: 57 U/L (ref 38–126)
ALT: 27 U/L (ref 17–63)
ANION GAP: 9 (ref 5–15)
AST: 27 U/L (ref 15–41)
Albumin: 4.1 g/dL (ref 3.5–5.0)
BILIRUBIN TOTAL: 1 mg/dL (ref 0.3–1.2)
BUN: 5 mg/dL — ABNORMAL LOW (ref 6–20)
CALCIUM: 9.4 mg/dL (ref 8.9–10.3)
CO2: 28 mmol/L (ref 22–32)
Chloride: 107 mmol/L (ref 101–111)
Creatinine, Ser: 0.81 mg/dL (ref 0.61–1.24)
GFR calc non Af Amer: >60 mL/min (ref >60)
Glucose, Bld: 98 mg/dL (ref 65–99)
Potassium: 3.2 mmol/L — ABNORMAL LOW (ref 3.5–5.1)
Sodium: 144 mmol/L (ref 135–145)
TOTAL PROTEIN: 7.4 g/dL (ref 6.5–8.1)

## 2017-08-18 LAB — CBC WITH DIFFERENTIAL/PLATELET
BASOS ABS: 0.1 10*3/uL (ref 0.0–0.1)
Basophils Relative: 1 %
Eosinophils Absolute: 0.2 10*3/uL (ref 0.0–0.7)
Eosinophils Relative: 2 %
HEMATOCRIT: 47.3 % (ref 39.0–52.0)
HEMOGLOBIN: 15.9 g/dL (ref 13.0–17.0)
LYMPHS PCT: 29 %
Lymphs Abs: 2.5 10*3/uL (ref 0.7–4.0)
MCH: 29.7 pg (ref 26.0–34.0)
MCHC: 33.6 g/dL (ref 30.0–36.0)
MCV: 88.4 fL (ref 78.0–100.0)
Monocytes Absolute: 0.8 10*3/uL (ref 0.1–1.0)
Monocytes Relative: 10 %
NEUTROS ABS: 5.2 10*3/uL (ref 1.7–7.7)
NEUTROS PCT: 58 %
PLATELETS: 216 10*3/uL (ref 150–400)
RBC: 5.35 MIL/uL (ref 4.22–5.81)
RDW: 13.1 % (ref 11.5–15.5)
WBC: 8.8 10*3/uL (ref 4.0–10.5)

## 2017-08-18 LAB — ETHANOL: Alcohol, Ethyl (B): <10 mg/dL (ref <10)

## 2017-08-18 NOTE — BH Assessment (Signed)
[place] Assessment Progress Note  Case discussed with Priscille Loveless, NP who recommends inpt treatment. EDP Tegeler, Gwenyth Allegra, MD advised of disposition and in agreement with plan of care. Pt's nurse advised of recommendation.  Lind Covert, MSW, LCSW Therapeutic Triage Specialist  [phone]

## 2017-08-18 NOTE — ED Triage Notes (Signed)
Per IVC paper work, mother took out papers due to patient voicing SI-states he has been committed 3 times in the past 6 months-states he is hearing voices
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
## 2017-08-18 NOTE — ED Notes (Signed)
Bed: WA31 Expected date:  Expected time:  Means of arrival:  Comments: [employer]-IVC 

## 2017-08-18 NOTE — ED Provider Notes (Signed)
[place] DEPT Provider Note   CSN: [id number] Arrival date & time: 08/18/17  1641     History   Chief Complaint Chief Complaint  Patient presents with  . IVC    HPI [name] is a 46 y.o. male.    The history is provided by the patient and medical records. No language interpreter was used.  Mental Health Problem  Presenting symptoms: hallucinations, paranoid behavior and suicidal thoughts (per IVC paoperwork)   Presenting symptoms: no agitation, no suicidal threats and no suicide attempt   Patient accompanied by:  Law enforcement Degree of incapacity (severity):  Severe Onset quality:  Gradual Duration:  3 days Timing:  Constant Progression:  Waxing and waning Chronicity:  Recurrent Treatment compliance:  All of the time Time since last psychoactive medication taken:  1 day Relieved by:  Nothing Worsened by:  Nothing Ineffective treatments:  None tried Associated symptoms: no abdominal pain, no chest pain, no fatigue and no headaches   Risk factors: hx of mental illness and hx of suicide attempts     Past Medical History:  Diagnosis Date  . Asthma   . CHF (congestive heart failure) ([place])   . Chronic back pain   . COPD (chronic obstructive pulmonary disease) ([place])   . Knee pain, chronic   . Migraines   . Schizophrenia, schizo-affective [facility])     Patient Active Problem List   Diagnosis Date Noted  . Hallucinations 07/29/2014  . Hypokalemia 04/28/2014  . Drug overdose 04/27/2014  . Schizoaffective disorder, bipolar type ([employer]) 03/02/2014  . Anxiety state 03/02/2014  . Benzodiazepine dependence ([place]) 03/02/2014  . Chest pain 10/11/2013  . EKG abnormalities 10/11/2013  . Chronic back pain     Past Surgical History:  Procedure Laterality Date  . extraction of wisdom teeth    . KNEE SURGERY     four times       Home Medications    Prior to Admission medications   Medication Sig Start Date End Date Taking?  Authorizing Provider  amLODipine (NORVASC) 5 MG tablet Take 1 tablet (5 mg total) by mouth daily. For high blood pressure 06/27/17   Nwoko, Herbert Pun I, NP  benztropine (COGENTIN) 0.5 MG tablet Take 1 tablet (0.5 mg total) by mouth 2 (two) times daily as needed for tremors (EPS). 06/26/17   Lindell Spar I, NP  lidocaine (LIDODERM) 5 % Place 1 patch onto the skin daily. Remove & Discard patch within 12 hours or as directed by MD: For pain management 06/26/17   Lindell Spar I, NP  LORazepam (ATIVAN) 1 MG tablet Take 1 tablet (1 mg total) by mouth every 6 (six) hours as needed for anxiety. 06/26/17   Lindell Spar I, NP  paliperidone (INVEGA) 9 MG 24 hr tablet Take 1 tablet (9 mg total) by mouth at bedtime. For mood control 06/26/17   Lindell Spar I, NP    Family History No family history on file.  Social History Social History   Tobacco Use  . Smoking status: Current Every Day Smoker    Packs/day: 3.00    Years: 10.00    Pack years: 30.00    Types: Cigarettes    Last attempt to quit: 02/26/2014    Years since quitting: 3.4  . Smokeless tobacco: Never Used  Substance Use Topics  . Alcohol use: No    Comment: occ  . Drug use: No     Allergies   Amoxicillin; Dextromethorphan-guaifenesin; Haloperidol; Meloxicam; Nsaids; Prednisone; Quetiapine;  Sudafed [pseudoephedrine hcl]; and Ziprasidone   Review of Systems Review of Systems  Constitutional: Negative for chills, diaphoresis and fatigue.  HENT: Negative for congestion.   Respiratory: Negative for cough, chest tightness, shortness of breath and wheezing.   Cardiovascular: Negative for chest pain and palpitations.  Gastrointestinal: Negative for abdominal pain.  Genitourinary: Negative for dysuria.  Musculoskeletal: Positive for back pain (for 20 years). Negative for neck pain and neck stiffness.  Skin: Negative for rash and wound.  Neurological: Negative for light-headedness and headaches.  Psychiatric/Behavioral: Positive for  hallucinations, paranoia and suicidal ideas (per IVC paoperwork). Negative for agitation and confusion.     Physical Exam Updated Vital Signs There were no vitals taken for this visit.  Physical Exam  Constitutional: He is oriented to person, place, and time. He appears well-developed and well-nourished. No distress.  HENT:  Head: Normocephalic and atraumatic.  Mouth/Throat: Oropharynx is clear and moist. No oropharyngeal exudate.  Eyes: Conjunctivae and EOM are normal. Pupils are equal, round, and reactive to light.  Neck: Normal range of motion.  Cardiovascular: Normal rate.  No murmur heard. Pulmonary/Chest: Effort normal. No respiratory distress. He has no wheezes. He exhibits no tenderness.  Abdominal: Soft. There is no tenderness.  Musculoskeletal: He exhibits no tenderness.  Neurological: He is alert and oriented to person, place, and time. No sensory deficit. He exhibits normal muscle tone.  Skin: Skin is warm. Capillary refill takes less than 2 seconds. He is not diaphoretic. No erythema. No pallor.  Psychiatric: His speech is not slurred. He is actively hallucinating (several daysa go). He is not agitated and not aggressive. Thought content is paranoid and delusional (signals come from his wires in the hourse and talk to him). He expresses no homicidal and no suicidal ideation. He expresses no suicidal plans and no homicidal plans.  Nursing note and vitals reviewed.    ED Treatments / Results  Labs (all labs ordered are listed, but only abnormal results are displayed) Labs Reviewed  COMPREHENSIVE METABOLIC PANEL - Abnormal; Notable for the following components:      Result Value   Potassium 3.2 (*)    BUN 5 (*)    All other components within normal limits  RAPID URINE DRUG SCREEN, HOSP PERFORMED - Abnormal; Notable for the following components:   Benzodiazepines POSITIVE (*)    All other components within normal limits  ETHANOL  CBC WITH DIFFERENTIAL/PLATELET     EKG  EKG Interpretation None       Radiology No results found.  Procedures Procedures (including critical care time)  Medications Ordered in ED Medications - No data to display   Initial Impression / Assessment and Plan / ED Course  I have reviewed the triage vital signs and the nursing notes.  Pertinent labs & imaging results that were available during my care of the patient were reviewed by me and considered in my medical decision making (see chart for details).     [name] is a 46 y.o. male with a past medical history significant for prior suicide attempt, schizoaffective disorder, COPD, asthma, CHF, and chronic back pain who presents under involuntary commitment for paranoia, hallucinations, and suicidal ideation.  IVC documentation was submitted by patient's mother who reported that patient has not been taking his medication for several days and has had worsening paranoia.  Patient does report that he has been hearing voices in the electronics and the wires of his house.  He reports that he turned the power off several  days ago and has not heard them in several days.  He says he does not know what they tell him.  He denies SI or HI to this examiner but he says that he has tried to kill himself numerous times.   IVC paperwork reports that he told his mother that he would kill himself.  Patient denies active hallucinations today and says that his last medication use was yesterday.  He denies other complaints aside from his chronic back pains for 20 years.  On exam, lungs are clear.  Chest is nontender.  Back is not significantly tender.  Abdomen nontender.  Patient is alert and oriented.  Patient will have screening laboratory testing performed and TTS will be called as patient is under IVC and does report recent paranoia and the IVC paperwork reveals concern for SI.  Anticipate TTS evaluation and management recommendation's.  Overall lab testing reassuring mild  hypokalemia.  Patient is medically cleared for psychiatric evaluation.  TTS called back to report that patient meets inpatient criteria.  Holding orders placed and patient will await placement.   Final Clinical Impressions(s) / ED Diagnoses   Final diagnoses:  Involuntary commitment    Clinical Impression: 1. Involuntary commitment     Disposition: Awaiting behavioral health placement, patient is under IVC.   This note was prepared with assistance of Systems analyst. Occasional wrong-word or sound-a-like substitutions may have occurred due to the inherent limitations of voice recognition software.      Caz Weaver, Gwenyth Allegra, MD 08/19/17 [phone]

## 2017-08-19 ENCOUNTER — Encounter (HOSPITAL_COMMUNITY): Payer: Self-pay | Admitting: *Deleted

## 2017-08-19 ENCOUNTER — Other Ambulatory Visit: Payer: Self-pay

## 2017-08-19 ENCOUNTER — Inpatient Hospital Stay (HOSPITAL_COMMUNITY)
Admission: AD | Admit: 2017-08-19 | Discharge: 2017-08-25 | DRG: 885 | Disposition: A | Discharge status: Home / Self Care | Payer: Medicare Other | Attending: Psychiatry | Admitting: Psychiatry

## 2017-08-19 ENCOUNTER — Encounter (HOSPITAL_COMMUNITY): Payer: Self-pay | Admitting: Emergency Medicine

## 2017-08-19 DIAGNOSIS — R4587 Impulsiveness: Secondary | ICD-10-CM | POA: Diagnosis not present

## 2017-08-19 DIAGNOSIS — R440 Auditory hallucinations: Secondary | ICD-10-CM | POA: Diagnosis not present

## 2017-08-19 DIAGNOSIS — F1990 Other psychoactive substance use, unspecified, uncomplicated: Secondary | ICD-10-CM | POA: Diagnosis present

## 2017-08-19 DIAGNOSIS — F17210 Nicotine dependence, cigarettes, uncomplicated: Secondary | ICD-10-CM | POA: Diagnosis present

## 2017-08-19 DIAGNOSIS — Z9114 Patient's other noncompliance with medication regimen: Secondary | ICD-10-CM | POA: Diagnosis not present

## 2017-08-19 DIAGNOSIS — F250 Schizoaffective disorder, bipolar type: Secondary | ICD-10-CM

## 2017-08-19 DIAGNOSIS — J449 Chronic obstructive pulmonary disease, unspecified: Secondary | ICD-10-CM | POA: Diagnosis present

## 2017-08-19 DIAGNOSIS — F1910 Other psychoactive substance abuse, uncomplicated: Secondary | ICD-10-CM | POA: Diagnosis not present

## 2017-08-19 DIAGNOSIS — R451 Restlessness and agitation: Secondary | ICD-10-CM | POA: Diagnosis not present

## 2017-08-19 DIAGNOSIS — I509 Heart failure, unspecified: Secondary | ICD-10-CM | POA: Diagnosis present

## 2017-08-19 DIAGNOSIS — F200 Paranoid schizophrenia: Principal | ICD-10-CM | POA: Diagnosis present

## 2017-08-19 DIAGNOSIS — F419 Anxiety disorder, unspecified: Secondary | ICD-10-CM | POA: Diagnosis not present

## 2017-08-19 DIAGNOSIS — G4700 Insomnia, unspecified: Secondary | ICD-10-CM | POA: Diagnosis not present

## 2017-08-19 DIAGNOSIS — R443 Hallucinations, unspecified: Secondary | ICD-10-CM

## 2017-08-19 MED ORDER — CHLORPROMAZINE HCL 25 MG PO TABS: 50.0000 mg | ORAL_TABLET | Freq: Once | ORAL | Status: DC

## 2017-08-19 MED ORDER — RISPERIDONE 2 MG PO TBDP
2.0000 mg | ORAL_TABLET | Freq: Three times a day (TID) | ORAL | Status: DC | PRN
  Administered 2017-08-21 – 2017-08-22 (×2): 2 mg via ORAL
  Filled 2017-08-19 (×3): qty 2

## 2017-08-19 MED ORDER — ACETAMINOPHEN 325 MG PO TABS
650.0000 mg | ORAL_TABLET | Freq: Four times a day (QID) | ORAL | Status: DC | PRN
  Administered 2017-08-19: 650 mg via ORAL
  Filled 2017-08-19: qty 2

## 2017-08-19 MED ORDER — LORAZEPAM 1 MG PO TABS
1.0000 mg | ORAL_TABLET | ORAL | Status: AC | PRN
  Administered 2017-08-19: 1 mg via ORAL
  Filled 2017-08-19: qty 1

## 2017-08-19 MED ORDER — TRAZODONE HCL 50 MG PO TABS: 50.0000 mg | ORAL_TABLET | Freq: Every evening | ORAL | Status: DC | PRN

## 2017-08-19 MED ORDER — ALUM & MAG HYDROXIDE-SIMETH 200-200-20 MG/5ML PO SUSP
30.0000 mL | ORAL | Status: DC | PRN
  Administered 2017-08-25: 30 mL via ORAL
  Filled 2017-08-19: qty 30

## 2017-08-19 MED ORDER — MAGNESIUM HYDROXIDE 400 MG/5ML PO SUSP: 30.0000 mL | Freq: Every day | ORAL | Status: DC | PRN

## 2017-08-19 MED ORDER — POTASSIUM CHLORIDE CRYS ER 20 MEQ PO TBCR
20.0000 meq | EXTENDED_RELEASE_TABLET | Freq: Two times a day (BID) | ORAL | Status: DC
Start: 2017-08-19 — End: 2017-08-19
  Filled 2017-08-19: qty 1

## 2017-08-19 MED ORDER — RISPERIDONE 1 MG PO TBDP
2.0000 mg | ORAL_TABLET | Freq: Three times a day (TID) | ORAL | Status: DC | PRN
  Filled 2017-08-19: qty 2

## 2017-08-19 MED ORDER — ACETAMINOPHEN 325 MG PO TABS
650.0000 mg | ORAL_TABLET | Freq: Four times a day (QID) | ORAL | Status: DC | PRN
  Administered 2017-08-19 – 2017-08-24 (×4): 650 mg via ORAL
  Filled 2017-08-19 (×4): qty 2

## 2017-08-19 MED ORDER — PALIPERIDONE ER 6 MG PO TB24
9.0000 mg | ORAL_TABLET | Freq: Every day | ORAL | Status: DC
  Administered 2017-08-19 – 2017-08-24 (×6): 9 mg via ORAL
  Filled 2017-08-19 (×10): qty 1

## 2017-08-19 MED ORDER — ARIPIPRAZOLE 10 MG PO TABS: 10.0000 mg | ORAL_TABLET | Freq: Once | ORAL | Status: DC

## 2017-08-19 MED ORDER — PALIPERIDONE ER 3 MG PO TB24
9.0000 mg | ORAL_TABLET | Freq: Every day | ORAL | Status: DC
  Administered 2017-08-19: 9 mg via ORAL
  Filled 2017-08-19 (×2): qty 3

## 2017-08-19 MED ORDER — HYDROXYZINE HCL 25 MG PO TABS
25.0000 mg | ORAL_TABLET | Freq: Four times a day (QID) | ORAL | Status: DC | PRN
  Administered 2017-08-21 – 2017-08-23 (×3): 25 mg via ORAL
  Filled 2017-08-19 (×3): qty 1

## 2017-08-19 NOTE — ED Notes (Signed)
Patient yelling and screaming loudly in his room. Security at bedside. Patient asked to stop screaming but continues to scream.

## 2017-08-19 NOTE — Progress Notes (Signed)
[name] is a 46 year old male pt admitted on involuntary basis. On admission, [name] presents as fidgety with pressured speech and tangential thought process. He spoke about how he has been taking his medications but then states that he only missed one day and spoke about how he was trying to get it filled that day and spoke about how his mother is blowing everything out of proportion. He reports that he is not suicidal at present, but has had thoughts in the past and is able to contract for safety on the unit. [name] is very easily distracted but able to be re-directed back to the topic at hand. He does endorse auditory hallucinations and spoke about how he is always hearing voices even when he does take his medicine like he is suppose to. He denies any current substance abuse issues. He reports that he lives by himself and reports that he will go back to the same situation when he gets discharged. [name] was oriented to the unit and safety maintained.

## 2017-08-19 NOTE — ED Notes (Signed)
Pt taking a shower 

## 2017-08-19 NOTE — ED Notes (Signed)
Phlebotomist at bedside.

## 2017-08-19 NOTE — Consult Note (Signed)
[place] [facility] Consult   Reason for Consult:  Paranoid behavior Referring Physician:  EDP Patient Identification: [name] MRN:  [id number] Principal Diagnosis: Schizoaffective disorder, bipolar type [facility]) Diagnosis:   Patient Active Problem List   Diagnosis Date Noted  . Hallucinations [R44.3] 07/29/2014  . Hypokalemia [E87.6] 04/28/2014  . Drug overdose [T50.901A] 04/27/2014  . Schizoaffective disorder, bipolar type ([place]) [F25.0] 03/02/2014  . Anxiety state [F41.1] 03/02/2014  . Benzodiazepine dependence ([place]) [F13.20] 03/02/2014  . Chest pain [R07.9] 10/11/2013  . EKG abnormalities [R94.31] 10/11/2013  . Chronic back pain [M54.9, G89.29]     Total Time spent with patient: 45 minutes  Subjective:   [name] Nyland is a 46 y.o. male patient admitted with paranoid delusions and auditory hallucinations.  HPI: Pt was seen and chart reviewed with treatment team and Dr Mariea Clonts. Pt is disorganized, paranoid and having auditory hallucinations. Pt stated he has contacted the [employer] because [place] [employer] [place] have violated his civil rights. Pt stated the hospital is going to be sued because he is not commitable but his mother is. Pt would benefit from an inpatient psychiatric admission for crisi stabilization and medication management.   Past Psychiatric History: As above Risk to Self: Suicidal Ideation: No(pt denies) Suicidal Intent: No Is patient at risk for suicide?: No Suicidal Plan?: No Access to Means: No What has been your use of drugs/alcohol within the last 12 months?: denies recent use  How many times?: 1 Triggers for Past Attempts: Hallucinations Intentional Self Injurious Behavior: None Risk to Others: Homicidal Ideation: No Thoughts of Harm to Others: No Current Homicidal Intent: No Current Homicidal Plan: No Access to Homicidal Means: No History of harm to others?: No Assessment of Violence: None Noted Does patient have access to  weapons?: No Criminal Charges Pending?: No Does patient have a court date: No Prior Inpatient Therapy: Prior Inpatient Therapy: Yes Prior Therapy Dates: 2018 and other admits  Prior Therapy Facilty/Provider(s): [facility] Reason for Treatment: Schizoaffective disorder Prior Outpatient Therapy: Prior Outpatient Therapy: Yes Prior Therapy Dates: unknown Prior Therapy Facilty/Provider(s): unknown Reason for Treatment: med management  Does patient have an ACCT team?: Unknown Does patient have Intensive In-House Services?  : Unknown Does patient have [facility] services? : Unknown Does patient have P4CC services?: Unknown  Past Medical History:  Past Medical History:  Diagnosis Date  . Asthma   . CHF (congestive heart failure) ([place])   . Chronic back pain   . COPD (chronic obstructive pulmonary disease) ([place])   . Knee pain, chronic   . Migraines   . Schizophrenia, schizo-affective ([place])     Past Surgical History:  Procedure Laterality Date  . extraction of wisdom teeth    . KNEE SURGERY     four times   Family History: No family history on file. Family Psychiatric  History: Unknown Social History:  Social History   Substance and Sexual Activity  Alcohol Use No   Comment: occ     Social History   Substance and Sexual Activity  Drug Use No    Social History   Socioeconomic History  . Marital status: Single    Spouse name: None  . Number of children: None  . Years of education: None  . Highest education level: None  Social Needs  . Financial resource strain: None  . Food insecurity - worry: None  . Food insecurity - inability: None  . Transportation needs - medical: None  . Transportation needs - non-medical: None  Occupational History  . None  Tobacco Use  . Smoking status: Current Every Day Smoker    Packs/day: 3.00    Years: 10.00    Pack years: 30.00    Types: Cigarettes    Last attempt to quit: 02/26/2014    Years since quitting: 3.4  . Smokeless tobacco: Never  Used  Substance and Sexual Activity  . Alcohol use: No    Comment: occ  . Drug use: No  . Sexual activity: No  Other Topics Concern  . None  Social History Narrative  . None   Additional Social History: N/A    Allergies:   Allergies  Allergen Reactions  . Amoxicillin Diarrhea    Has patient had a PCN reaction causing immediate rash, facial/tongue/throat swelling, SOB or lightheadedness with hypotension: No Has patient had a PCN reaction causing severe rash involving mucus membranes or skin necrosis: No Has patient had a PCN reaction that required hospitalization: No Has patient had a PCN reaction occurring within the last 10 years: No If all of the above answers are "NO", then may proceed with Cephalosporin use.   Marland Kitchen Dextromethorphan-Guaifenesin Other (See Comments)    unspecified  . Haloperidol Other (See Comments)    unspecified  . Meloxicam Other (See Comments)    unspecified  . Nsaids Other (See Comments)    Rectal bleeding  . Prednisone Other (See Comments)    unspecified  . Quetiapine Other (See Comments)    Psychosis with high dose (627m)  . Sudafed [Pseudoephedrine Hcl] Other (See Comments)    Hallucinations  . Ziprasidone Other (See Comments)    unspecified    Labs:  Results for orders placed or performed during the hospital encounter of 08/18/17 (from the past 48 hour(s))  Comprehensive metabolic panel     Status: Abnormal   Collection Time: 08/18/17  5:00 PM  Result Value Ref Range   Sodium 144 135 - 145 mmol/L   Potassium 3.2 (L) 3.5 - 5.1 mmol/L   Chloride 107 101 - 111 mmol/L   CO2 28 22 - 32 mmol/L   Glucose, Bld 98 65 - 99 mg/dL   BUN 5 (L) 6 - 20 mg/dL   Creatinine, Ser 0.81 0.61 - 1.24 mg/dL   Calcium 9.4 8.9 - 10.3 mg/dL   Total Protein 7.4 6.5 - 8.1 g/dL   Albumin 4.1 3.5 - 5.0 g/dL   AST 27 15 - 41 U/L   ALT 27 17 - 63 U/L   Alkaline Phosphatase 57 38 - 126 U/L   Total Bilirubin 1.0 0.3 - 1.2 mg/dL   GFR calc non Af Amer >60 >60 mL/min    GFR calc Af Amer >60 >60 mL/min    Comment: (NOTE) The eGFR has been calculated using the CKD EPI equation. This calculation has not been validated in all clinical situations. eGFR's persistently <60 mL/min signify possible Chronic Kidney Disease.    Anion gap 9 5 - 15  Ethanol     Status: None   Collection Time: 08/18/17  5:00 PM  Result Value Ref Range   Alcohol, Ethyl (B) <10 <10 mg/dL    Comment:        LOWEST DETECTABLE LIMIT FOR SERUM ALCOHOL IS 10 mg/dL FOR MEDICAL PURPOSES ONLY   CBC with Diff     Status: None   Collection Time: 08/18/17  5:00 PM  Result Value Ref Range   WBC 8.8 4.0 - 10.5 K/uL   RBC 5.35 4.22 - 5.81 MIL/uL  Hemoglobin 15.9 13.0 - 17.0 g/dL   HCT 47.3 39.0 - 52.0 %   MCV 88.4 78.0 - 100.0 fL   MCH 29.7 26.0 - 34.0 pg   MCHC 33.6 30.0 - 36.0 g/dL   RDW 13.1 11.5 - 15.5 %   Platelets 216 150 - 400 K/uL   Neutrophils Relative % 58 %   Neutro Abs 5.2 1.7 - 7.7 K/uL   Lymphocytes Relative 29 %   Lymphs Abs 2.5 0.7 - 4.0 K/uL   Monocytes Relative 10 %   Monocytes Absolute 0.8 0.1 - 1.0 K/uL   Eosinophils Relative 2 %   Eosinophils Absolute 0.2 0.0 - 0.7 K/uL   Basophils Relative 1 %   Basophils Absolute 0.1 0.0 - 0.1 K/uL  Urine rapid drug screen (hosp performed)     Status: Abnormal   Collection Time: 08/18/17  7:42 PM  Result Value Ref Range   Opiates NONE DETECTED NONE DETECTED   Cocaine NONE DETECTED NONE DETECTED   Benzodiazepines POSITIVE (A) NONE DETECTED   Amphetamines NONE DETECTED NONE DETECTED   Tetrahydrocannabinol NONE DETECTED NONE DETECTED   Barbiturates NONE DETECTED NONE DETECTED    Comment:        DRUG SCREEN FOR MEDICAL PURPOSES ONLY.  IF CONFIRMATION IS NEEDED FOR ANY PURPOSE, NOTIFY LAB WITHIN 5 DAYS.        LOWEST DETECTABLE LIMITS FOR URINE DRUG SCREEN Drug Class       Cutoff (ng/mL) Amphetamine      1000 Barbiturate      200 Benzodiazepine   086 Tricyclics       761 Opiates          300 Cocaine           300 THC              50     Current Facility-Administered Medications  Medication Dose Route Frequency Provider Last Rate Last Dose  . ARIPiprazole (ABILIFY) tablet 10 mg  10 mg Oral Once Ethelene Hal, NP      . paliperidone (INVEGA) 24 hr tablet 9 mg  9 mg Oral QHS Orpah Greek, MD   9 mg at 08/19/17 0148  . potassium chloride SA (K-DUR,KLOR-CON) CR tablet 20 mEq  20 mEq Oral BID Ethelene Hal, NP      . risperiDONE (RISPERDAL M-TABS) disintegrating tablet 2 mg  2 mg Oral Q8H PRN Orpah Greek, MD       Current Outpatient Medications  Medication Sig Dispense Refill  . paliperidone (INVEGA) 9 MG 24 hr tablet Take 1 tablet (9 mg total) by mouth at bedtime. For mood control 30 tablet 0    Musculoskeletal: Strength & Muscle Tone: within normal limits Gait & Station: normal Patient leans: N/A  Psychiatric Specialty Exam: Physical Exam  Nursing note and vitals reviewed. Constitutional: He appears well-developed and well-nourished.  HENT:  Head: Normocephalic.  Neck: Normal range of motion.  Respiratory: Effort normal.  Musculoskeletal: Normal range of motion.  Neurological: He is alert.  Skin: No rash noted.  Psychiatric: His affect is angry. His speech is rapid and/or pressured. He is agitated. Thought content is paranoid and delusional. Cognition and memory are impaired. He expresses impulsivity.    Review of Systems  Psychiatric/Behavioral: Positive for depression and hallucinations. Negative for memory loss, substance abuse and suicidal ideas. The patient is not nervous/anxious and does not have insomnia.   All other systems reviewed and are negative.   Blood pressure  115/89, pulse 75, temperature 99.1 F (37.3 C), temperature source Oral, resp. rate 18, SpO2 98 %.There is no height or weight on file to calculate BMI.  General Appearance: Casual  Eye Contact:  Good  Speech:  Blocked and Pressured  Volume:  Normal  Mood:  Irritable   Affect:  Congruent  Thought Process:  Disorganized  Orientation:  Full (Time, Place, and Person)  Thought Content:  Illogical and Delusions  Suicidal Thoughts:  No  Homicidal Thoughts:  No  Memory:  Immediate;   Fair Recent;   Fair Remote;   Fair  Judgement:  Poor  Insight:  Lacking  Psychomotor Activity:  Normal  Concentration:  Concentration: Fair and Attention Span: Fair  Recall:  [employer] of Knowledge:  Good  Language:  Good  Akathisia:  No  Handed:  Right  AIMS (if indicated):    N/A  Assets:  Financial Resources/Insurance Housing Physical Health Social Support  ADL's:  Intact  Cognition:  WNL  Sleep:    N/A     Treatment Plan Summary: Daily contact with patient to assess and evaluate symptoms and progress in treatment and Medication management (See MAR )  -Crisis Stabilization  Disposition: Recommend psychiatric Inpatient admission when medically cleared.  Ethelene Hal, NP 08/19/2017 1:08 PM   Patient seen face-to-face for psychiatric evaluation, chart reviewed and case discussed with the physician extender and developed treatment plan. Reviewed the information documented and agree with the treatment plan.  Buford Dresser, DO

## 2017-08-19 NOTE — ED Notes (Signed)
Pt noted talking to self in room. Pt endorsing AH. Pt blaming his mother for being in the hospital. Tangential on approach, paranoid, reports he has contacted the [employer]. Denies SI/HI. Pt refusing Abilify. Reports to this nurse "That medication makes my dick go crazy." This nurse notified Margarita Grizzle NP. Encouragement and support provided. Special checks q 15 mins in place for safety, Video monitoring in place. Will continue to monitor.

## 2017-08-19 NOTE — Tx Team (Signed)
Initial Treatment Plan 08/19/2017 11:08 PM Mali A Sossamon GGE:[id number]    PATIENT STRESSORS: Medication change or noncompliance   PATIENT STRENGTHS: Ability for insight Average or above average intelligence Capable of independent living General fund of knowledge Motivation for treatment/growth   PATIENT IDENTIFIED PROBLEMS: Auditory hallucinations Anger Suicidal thoughts "Not tune into the voices" "Not talking to myself"                     DISCHARGE CRITERIA:  Ability to meet basic life and health needs Improved stabilization in mood, thinking, and/or behavior Verbal commitment to aftercare and medication compliance  PRELIMINARY DISCHARGE PLAN: Attend aftercare/continuing care group Return to previous living arrangement  PATIENT/FAMILY INVOLVEMENT: This treatment plan has been presented to and reviewed with the patient, Mali A Haith, and/or family member, .  The patient and family have been given the opportunity to ask questions and make suggestions.  [place], [place], [place] 08/19/2017, 11:08 PM

## 2017-08-19 NOTE — ED Notes (Signed)
Dr. Joseph Berkshire called and informed of patient behavior and patient request for his home medications.

## 2017-08-19 NOTE — BH Assessment (Signed)
[facility] Assessment Progress Note  Per Buford Dresser, DO, this pt requires psychiatric hospitalization.  Leonia Reader, RN, [facility] Rm [id number]; she will call when [facility] is ready to receive pt.  Pt presents under IVC initiated by pt's mother, and upheld by Dr Mariea Clonts, and IVC documents have been faxed to [facility].  Pt's nurse, Caryl Pina, has been notified, and agrees to call report to [phone].  Pt is to be transported via Event organiser when the time comes.   Jalene Mullet, [place] Triage Specialist [phone]

## 2017-08-19 NOTE — ED Notes (Signed)
Patient denies SI/HI.AVH. Plan of care discussed. Encouragement and support provided and safety maintain. Q 15 min safety checks in place and video monitoring.  

## 2017-08-19 NOTE — ED Notes (Signed)
[facility] on unit to transfer pt to [facility] per MD order. Personal property given to [employer] for transport. Pt ambulatory off unit in police custody.

## 2017-08-19 NOTE — Progress Notes (Signed)
Report received by [facility], RN. Pt is seen eating a meal. Pt denies SI/HI/AVH at this time. Rates pain 8/10; lower back. PRN tylenol requested and given; bedtime medications given without c/o. Pt states "I am only here because my mom sent me; I only miss my medication once".  Pt is cooperative and calm. Pt c/o of insomnia but refused PRN trazodone.

## 2017-08-19 NOTE — ED Notes (Signed)
This nurse called phlebotomist for ordered prolactin lab draw.

## 2017-08-20 DIAGNOSIS — R440 Auditory hallucinations: Secondary | ICD-10-CM

## 2017-08-20 DIAGNOSIS — F200 Paranoid schizophrenia: Principal | ICD-10-CM

## 2017-08-20 DIAGNOSIS — F419 Anxiety disorder, unspecified: Secondary | ICD-10-CM

## 2017-08-20 DIAGNOSIS — F1910 Other psychoactive substance abuse, uncomplicated: Secondary | ICD-10-CM

## 2017-08-20 DIAGNOSIS — F17210 Nicotine dependence, cigarettes, uncomplicated: Secondary | ICD-10-CM

## 2017-08-20 LAB — PROLACTIN: PROLACTIN: 42.9 ng/mL — AB (ref 4.0–15.2)

## 2017-08-20 MED ORDER — PALIPERIDONE PALMITATE 234 MG/1.5ML IM SUSP
234.0000 mg | Freq: Once | INTRAMUSCULAR | Status: AC
  Administered 2017-08-20: 234 mg via INTRAMUSCULAR
  Filled 2017-08-20: qty 1.5

## 2017-08-20 MED ORDER — PALIPERIDONE PALMITATE 156 MG/ML IM SUSP: 156.0000 mg | INTRAMUSCULAR | Status: DC

## 2017-08-20 NOTE — Progress Notes (Signed)
DAR NOTE: Patient presents with irritable affect and labile mood.  Speech is pressured with tangential thought process.  Denies auditory and visual hallucinations.  Patient restless, agitated and demanding to have Xanax.  Prn offered but refused.  States none of these medications work for him.  Rates depression at 0, hopelessness at 0, and anxiety at 0.  Maintained on routine safety checks.  Medications given as prescribed.  Support and encouragement offered as needed.  Attended group and participated.    Patient heard banging the wall in his room but was redirected.   Received Invega sustenna injection.  No adverse reaction noted or reported.

## 2017-08-20 NOTE — Plan of Care (Signed)
Pt slept 6.75 hrs last night.

## 2017-08-20 NOTE — BHH Suicide Risk Assessment (Signed)
[facility] Admission Suicide Risk Assessment   Nursing information obtained from:    Demographic factors:    Current Mental Status:    Loss Factors:    Historical Factors:    Risk Reduction Factors:     Total Time spent with patient: 1 hour Principal Problem: Schizophrenia, paranoid type ([place]) Diagnosis:   Patient Active Problem List   Diagnosis Date Noted  . Schizophrenia, paranoid type ([place]) [F20.0] 08/19/2017  . Hallucinations [R44.3] 07/29/2014  . Hypokalemia [E87.6] 04/28/2014  . Drug overdose [T50.901A] 04/27/2014  . Schizoaffective disorder, bipolar type ([place]) [F25.0] 03/02/2014  . Anxiety state [F41.1] 03/02/2014  . Benzodiazepine dependence ([place]) [F13.20] 03/02/2014  . Chest pain [R07.9] 10/11/2013  . EKG abnormalities [R94.31] 10/11/2013  . Chronic back pain [M54.9, G89.29]    Subjective Data:  See H&P for full HPI [name] is a 46 y/o M with history of schizophrenia who was admitted on IVC placed by his mother due to worsening psychosis, irritability, poor medication adherence, and illicit use of alprazolam. Pt was discharged from [facility] in October 2018 on regimen of Invega oral formulation, and he reports that it was helpful for him. Pt agrees to be started on [place] as well as restarted on oral invega (to be tapered off once stable on Sustenna).  Continued Clinical Symptoms:  Alcohol Use Disorder Identification Test Final Score (AUDIT): 0 The "Alcohol Use Disorders Identification Test", Guidelines for Use in Primary Care, Second Edition.  World Pharmacologist [facility]). Score between 0-7:  no or low risk or alcohol related problems. Score between 8-15:  moderate risk of alcohol related problems. Score between 16-19:  high risk of alcohol related problems. Score 20 or above:  warrants further diagnostic evaluation for alcohol dependence and treatment.   CLINICAL FACTORS:   Severe Anxiety and/or Agitation Schizophrenia:   Paranoid or undifferentiated  type   Musculoskeletal: Strength & Muscle Tone: within normal limits Gait & Station: normal Patient leans: N/A  Psychiatric Specialty Exam: Physical Exam  Nursing note and vitals reviewed.   Review of Systems  Constitutional: Negative for chills and fever.  Respiratory: Negative for cough and shortness of breath.   Cardiovascular: Negative for chest pain.  Psychiatric/Behavioral: Positive for hallucinations and substance abuse. Negative for depression and suicidal ideas.    Blood pressure 108/84, pulse 78, temperature 98.8 F (37.1 C), temperature source Oral, resp. rate 17, height 6\' 1"  (1.854 m), weight 99.3 kg (219 lb), SpO2 100 %.Body mass index is 28.89 kg/m.  General Appearance: Casual and Fairly Groomed  Eye Contact:  Good  Speech:  Clear and Coherent and Normal Rate  Volume:  Increased  Mood:  Anxious and Irritable  Affect:  Blunt, Congruent and Labile  Thought Process:  Coherent, Disorganized and Descriptions of Associations: Loose  Orientation:  Full (Time, Place, and Person)  Thought Content:  Delusions, Hallucinations: Auditory and Paranoid Ideation  Suicidal Thoughts:  No  Homicidal Thoughts:  No  Memory:  Immediate;   Fair Recent;   Fair Remote;   Fair  Judgement:  Fair  Insight:  Lacking  Psychomotor Activity:  Normal  Concentration:  Concentration: Good  Recall:  [employer] of Knowledge:  Fair  Language:  Fair  Akathisia:  No  Handed:    AIMS (if indicated):     Assets:  Communication Skills Resilience Social Support Talents/Skills  ADL's:  Intact  Cognition:  WNL  Sleep:  Number of Hours: 6.75       COGNITIVE FEATURES THAT CONTRIBUTE  TO RISK:  Closed-mindedness and Thought constriction (tunnel vision)    SUICIDE RISK:   Minimal: No identifiable suicidal ideation.  Patients presenting with no risk factors but with morbid ruminations; may be classified as minimal risk based on the severity of the depressive symptoms  PLAN OF CARE:  - admit  to inpatient psychiatry unit  - Schizophrenia  - Restart Invega 9 mg qDay  - Start Lorayne Bender Sustenna 234mg  IM once and then [place] 156mg  q28 days starting in 7 days on 08/27/17  - Agitation   - continue risperdal M-tab 2mg  q8h prn agitation - Insomnia  - Continue trazodone 50mg  qhs prn insomnia -anxiety  - Continue atarax 25mg  q6h prn anxiety  -Encourage participation in groups and therapeutic milieu -Discharge planning will be ongoing  I certify that inpatient services furnished can reasonably be expected to improve the patient's condition.   Pennelope Bracken, MD 08/20/2017, 12:38 PM

## 2017-08-20 NOTE — Progress Notes (Signed)
Patient rated his day a 7. Patient stated he slept a lot today and took a shower.

## 2017-08-20 NOTE — Tx Team (Signed)
Interdisciplinary Treatment and Diagnostic Plan Update  08/20/2017 Time of Session: 10:27 AM  [name] Offutt MRN: [id number]  Principal Diagnosis: <principal problem not specified>  Secondary Diagnoses: Active Problems:   Schizophrenia, paranoid type ([place])   Current Medications:  Current Facility-Administered Medications  Medication Dose Route Frequency Provider Last Rate Last Dose  . acetaminophen (TYLENOL) tablet 650 mg  650 mg Oral Q6H PRN Ethelene Hal, NP   650 mg at 08/19/17 2213  . alum & mag hydroxide-simeth (MAALOX/MYLANTA) 200-200-20 MG/5ML suspension 30 mL  30 mL Oral Q4H PRN Ethelene Hal, NP      . hydrOXYzine (ATARAX/VISTARIL) tablet 25 mg  25 mg Oral Q6H PRN Ethelene Hal, NP      . magnesium hydroxide (MILK OF MAGNESIA) suspension 30 mL  30 mL Oral Daily PRN Ethelene Hal, NP      . paliperidone (INVEGA SUSTENNA) injection 234 mg  234 mg Intramuscular Once Pennelope Bracken, MD       And  . Derrill Memo ON 08/27/2017] paliperidone (INVEGA SUSTENNA) injection 156 mg  156 mg Intramuscular Q30 days Maris Berger T, MD      . paliperidone (INVEGA) 24 hr tablet 9 mg  9 mg Oral QHS Ethelene Hal, NP   9 mg at 08/19/17 2026  . risperiDONE (RISPERDAL M-TABS) disintegrating tablet 2 mg  2 mg Oral Q8H PRN Ethelene Hal, NP      . traZODone (DESYREL) tablet 50 mg  50 mg Oral QHS PRN Ethelene Hal, NP        PTA Medications: Medications Prior to Admission  Medication Sig Dispense Refill Last Dose  . paliperidone (INVEGA) 9 MG 24 hr tablet Take 1 tablet (9 mg total) by mouth at bedtime. For mood control 30 tablet 0 08/17/2017 at Unknown time    Patient Stressors: Medication change or noncompliance  Patient Strengths: Ability for insight Average or above average intelligence Capable of independent living General fund of knowledge Motivation for treatment/growth  Treatment Modalities: Medication Management, Group  therapy, Case management,  1 to 1 session with clinician, Psychoeducation, Recreational therapy.   Physician Treatment Plan for Primary Diagnosis: <principal problem not specified> Long Term Goal(s): Improvement in symptoms so as ready for discharge  Short Term Goals:    Medication Management: Evaluate patient's response, side effects, and tolerance of medication regimen.  Therapeutic Interventions: 1 to 1 sessions, Unit Group sessions and Medication administration.  Evaluation of Outcomes: Progressing  Physician Treatment Plan for Secondary Diagnosis: Active Problems:   Schizophrenia, paranoid type ([place])   Long Term Goal(s): Improvement in symptoms so as ready for discharge  Short Term Goals:    Medication Management: Evaluate patient's response, side effects, and tolerance of medication regimen.  Therapeutic Interventions: 1 to 1 sessions, Unit Group sessions and Medication administration.  Evaluation of Outcomes: Progressing   RN Treatment Plan for Primary Diagnosis: <principal problem not specified> Long Term Goal(s): Knowledge of disease and therapeutic regimen to maintain health will improve  Short Term Goals: Ability to identify and develop effective coping behaviors will improve and Compliance with prescribed medications will improve  Medication Management: RN will administer medications as ordered by provider, will assess and evaluate patient's response and provide education to patient for prescribed medication. RN will report any adverse and/or side effects to prescribing provider.  Therapeutic Interventions: 1 on 1 counseling sessions, Psychoeducation, Medication administration, Evaluate responses to treatment, Monitor vital signs and CBGs as ordered, Perform/monitor CIWA, COWS, AIMS and Fall  Risk screenings as ordered, Perform wound care treatments as ordered.  Evaluation of Outcomes: Progressing   LCSW Treatment Plan for Primary Diagnosis: <principal problem not  specified> Long Term Goal(s): Safe transition to appropriate next level of care at discharge, Engage patient in therapeutic group addressing interpersonal concerns.  Short Term Goals: Engage patient in aftercare planning with referrals and resources  Therapeutic Interventions: Assess for all discharge needs, 1 to 1 time with Social worker, Explore available resources and support systems, Assess for adequacy in community support network, Educate family and significant other(s) on suicide prevention, Complete Psychosocial Assessment, Interpersonal group therapy.  Evaluation of Outcomes: Met  Return home, follow up [facility]   Progress in Treatment: Attending groups: Yes Participating in groups: Yes Taking medication as prescribed: Yes Toleration medication: Yes, no side effects reported at this time Family/Significant other contact made: No Patient understands diagnosis: Yes AEB asking for help with AH Discussing patient identified problems/goals with staff: Yes Medical problems stabilized or resolved: Yes Denies suicidal/homicidal ideation: Yes Issues/concerns per patient self-inventory: None Other: N/A  New problem(s) identified: None identified at this time.   New Short Term/Long Term Goal(s): "Not tune into the voices"   Discharge Plan or Barriers:   Reason for Continuation of Hospitalization:  Delusions  Depression Hallucinations Paranoia Medication stabilization Suicidal ideation    Estimated Length of Stay: 12/18  Attendees: Patient: [name] 08/20/2017  10:27 AM  Physician: Maris Berger, MD 08/20/2017  10:27 AM  Nursing: Sena Hitch, RN 08/20/2017  10:27 AM  RN Care Manager: Lars Pinks, RN 08/20/2017  10:27 AM  Social Worker: Ripley Fraise 08/20/2017  10:27 AM  Recreational Therapist: Winfield Cunas 08/20/2017  10:27 AM  Other: Norberto Sorenson 08/20/2017  10:27 AM  Other:  08/20/2017  10:27 AM    Scribe for Treatment Team:  Roque Lias LCSW 08/20/2017 10:27 AM

## 2017-08-20 NOTE — Progress Notes (Signed)
Recreation Therapy Notes  INPATIENT RECREATION THERAPY ASSESSMENT  Patient Details Name: [name] MRN: [id number] DOB: [birth year] Today's Date: 08/20/2017  Patient Stressors: Family, Other (Comment)(Hacked)  Pt stated he was here because he his mother had him IVC'd because he missed his medications for one day.  Coping Skills:   Substance Abuse, Talking  Personal Challenges: Communication, Social Interaction, Substance Abuse  Leisure Interests (2+):  Individual - Musician Resources:  No  Patient Strengths:  Find God anywhere  Patient Identified Areas of Improvement:  Quit communicating threats; better coping skills  Current Recreation Participation:  Everyday  Patient Goal for Hospitalization:  "Need pain relief"  [place] of Residence:  [place] of Residence:  [place]  Current [place] (including self-harm):  No  Current HI:  No  Consent to Intern Participation: N/A    Victorino Sparrow, LRT/CTRS  Victorino Sparrow A 08/20/2017, 12:38 PM

## 2017-08-20 NOTE — Progress Notes (Signed)
Recreation Therapy Notes  Date: 08/20/17 Time: 1000 Location: 500 Hall  Group Topic: Teambuilding  Goal Area(s) Addresses:  Patient will effectively work with peers in group.  Patient will verbalize benefit of healthy teambuilding. Patient will verbalize positive effect of healthy team building post d/c.  Patient will identify teambuilding techniques that made activity effective for group.   Behavioral Response: Minimal  Intervention: Rubber disks  Activity: Sharks in [employer].  Each patient was given a rubber disk and one extra disk for the group.  Patient were to maneuver each patient from one end of the hallway to the other using only the disks provided.  If any person stepped off their disk, the group would have to start over.  Education: Communication, Discharge Planning  Education Outcome: Acknowledges understanding/In group clarification offered/Needs additional education.   Clinical Observations/Feedback:  Pt stated support systems "help and support you through things".  Pt left early to meet with doctor and did not return.   Victorino Sparrow, LRT/CTRS        Ria Comment, Abrahm Mancia A 08/20/2017 11:05 AM

## 2017-08-20 NOTE — Progress Notes (Signed)
  DATA ACTION RESPONSE  Objective- Pt. is visible at nurses station, seen talking to staff/pacing.Presents with an irritable/labile affect and mood.Pt c/o of sleep disturbance.  Subjective- Denies having any SI/HI/AVH/Pain at this time. Is cooperative and remains safe on the unit.  1:1 interaction in private to establish rapport. Encouragement, education, & support given from staff.     Safety maintained with Q 15 checks. Continue with POC.

## 2017-08-20 NOTE — H&P (Signed)
Psychiatric Admission Assessment Adult  Patient Identification: [name] MRN:  [id number] Date of Evaluation:  08/20/2017 Chief Complaint:  SCHIZOAFFECTIVE DISORDER Principal Diagnosis: Schizophrenia, paranoid type ([place]) Diagnosis:   Patient Active Problem List   Diagnosis Date Noted  . Schizophrenia, paranoid type ([place]) [F20.0] 08/19/2017  . Hallucinations [R44.3] 07/29/2014  . Hypokalemia [E87.6] 04/28/2014  . Drug overdose [T50.901A] 04/27/2014  . Schizoaffective disorder, bipolar type ([place]) [F25.0] 03/02/2014  . Anxiety state [F41.1] 03/02/2014  . Benzodiazepine dependence ([place]) [F13.20] 03/02/2014  . Chest pain [R07.9] 10/11/2013  . EKG abnormalities [R94.31] 10/11/2013  . Chronic back pain [M54.9, G89.29]    History of Present Illness:  [name] Dini is a 46 y/o M with history of schizophrenia who was admitted on IVC placed by his mother with worsening symptoms of psychosis, agitation, irritability, poor medication adherence, and illicit use of alprazolam. Pt had agitation upon presentation to ED. He has recent relevant history of discharge from [facility] inpatient on 06/26/17 to outpatient level of care.  Upon interview, pt explains his reasons for returning to the hospital, stating, " My mom petitioned to have me come back because I missed one day of medication - well, it might have been like three days." Pt continues, "I was just starting to do better." Pt reports he previously had good adherence to his discharge medication of Invega 41m qDay, but he stopped taking it because he felt that it was possibly worsening his symptoms of AH. Pt reports delusion that he has ability "to contact anyone in the world with my mind instantaneously." He denies VH/SI/HI. He reports that he has been sleeping well. He reports his mood has been stable, but he has been irritable that everyone keeps insisting he has a mental illness when he feels that he has "not had a medical clearance in 28 years." Discussed  with patient that he has been medically evaluated including with head imaging in the recent months which did not show any organic explanation for his symptoms. Pt denies illicit substance use aside from using alprazolam which he purchases off the street, and he states that he uses "Four milligrams only at bedtime."  Discussed with patient about treatment options. As he initially reported improvement of his symptoms enough to be discharged from inpatient as well as a period of stability as an outpatient taking Invega oral form, we discussed that patient had some success with Invega. Pt expresses concern that he feels some of his hallucinations have been worsened by ILorayne Bender but we explored how that was incongruent with what he had reported during his last stay and initially upon the start of the interview. Discussed with patient that if he has trouble with compliance and his mother expressed concern about his adherence enough that she entered his poor adherence as part of the IVC paperwork, he should possibly consider a long-acting injectable form of Invega. Pt was in agreement to start IMauritiusin addition to resuming oral Invega. He had no further questions, comments, or concerns.   Associated Signs/Symptoms: Depression Symptoms:  anxiety, disturbed sleep, (Hypo) Manic Symptoms:  Delusions, Distractibility, Grandiosity, Hallucinations, Impulsivity, Irritable Mood, Anxiety Symptoms:  Excessive Worry, Psychotic Symptoms:  Delusions, Hallucinations: Auditory Ideas of Reference, Paranoia, PTSD Symptoms: NA Total Time spent with patient: 1 hour  Past Psychiatric History: - hx of schizoaffective disorder - [facility] [place] [place] [facility] [place] denies suicide attempt history  Is the patient at  risk to self? Yes.    Has the patient been a risk to self in the past 6 months? Yes.    Has the  patient been a risk to self within the distant past? Yes.    Is the patient a risk to others? Yes.    Has the patient been a risk to others in the past 6 months? Yes.    Has the patient been a risk to others within the distant past? Yes.     Prior Inpatient Therapy:   Prior Outpatient Therapy:    Alcohol Screening: 1. How often do you have a drink containing alcohol?: Never 2. How many drinks containing alcohol do you have on a typical day when you are drinking?: 1 or 2 3. How often do you have six or more drinks on one occasion?: Never AUDIT-C Score: 0 4. How often during the last year have you found that you were not able to stop drinking once you had started?: Never 5. How often during the last year have you failed to do what was normally expected from you becasue of drinking?: Never 6. How often during the last year have you needed a first drink in the morning to get yourself going after a heavy drinking session?: Never 7. How often during the last year have you had a feeling of guilt of remorse after drinking?: Never 8. How often during the last year have you been unable to remember what happened the night before because you had been drinking?: Never 9. Have you or someone else been injured as a result of your drinking?: No 10. Has a relative or friend or a doctor or another health worker been concerned about your drinking or suggested you cut down?: No Alcohol Use Disorder Identification Test Final Score (AUDIT): 0 Intervention/Follow-up: AUDIT Score <7 follow-up not indicated Substance Abuse History in the last 12 months:  Yes.   Consequences of Substance Abuse: Medical Consequences:  worsening psychosis Previous Psychotropic Medications: Yes  Psychological Evaluations: Yes  Past Medical History:  Past Medical History:  Diagnosis Date  . Asthma   . CHF (congestive heart failure) ([place])   . Chronic back pain   . COPD (chronic obstructive pulmonary disease) ([place])   . Knee pain,  chronic   . Migraines   . Schizophrenia, schizo-affective ([place])     Past Surgical History:  Procedure Laterality Date  . extraction of wisdom teeth    . KNEE SURGERY     four times   Family History: History reviewed. No pertinent family history. Family Psychiatric  History: denies family psychiatric history Tobacco Screening: Have you used any form of tobacco in the last 30 days? (Cigarettes, Smokeless Tobacco, Cigars, and/or Pipes): Yes Tobacco use, Select all that apply: 5 or more cigarettes per day Are you interested in Tobacco Cessation Medications?: No, patient refused Counseled patient on smoking cessation including recognizing danger situations, developing coping skills and basic information about quitting provided: Refused/Declined practical counseling Social History:  Social History   Substance and Sexual Activity  Alcohol Use No   Comment: occ     Social History   Substance and Sexual Activity  Drug Use No    Additional Social History:                           Allergies:   Allergies  Allergen Reactions  . Amoxicillin Diarrhea    Has patient had a PCN reaction causing immediate rash,  facial/tongue/throat swelling, SOB or lightheadedness with hypotension: No Has patient had a PCN reaction causing severe rash involving mucus membranes or skin necrosis: No Has patient had a PCN reaction that required hospitalization: No Has patient had a PCN reaction occurring within the last 10 years: No If all of the above answers are "NO", then may proceed with Cephalosporin use.   Marland Kitchen Dextromethorphan-Guaifenesin Other (See Comments)    unspecified  . Haloperidol Other (See Comments)    unspecified  . Meloxicam Other (See Comments)    unspecified  . Nsaids Other (See Comments)    Rectal bleeding  . Prednisone Other (See Comments)    unspecified  . Quetiapine Other (See Comments)    Psychosis with high dose (664m)  . Sudafed [Pseudoephedrine Hcl] Other (See  Comments)    Hallucinations  . Ziprasidone Other (See Comments)    unspecified   Lab Results:  Results for orders placed or performed during the hospital encounter of 08/18/17 (from the past 48 hour(s))  Comprehensive metabolic panel     Status: Abnormal   Collection Time: 08/18/17  5:00 PM  Result Value Ref Range   Sodium 144 135 - 145 mmol/L   Potassium 3.2 (L) 3.5 - 5.1 mmol/L   Chloride 107 101 - 111 mmol/L   CO2 28 22 - 32 mmol/L   Glucose, Bld 98 65 - 99 mg/dL   BUN 5 (L) 6 - 20 mg/dL   Creatinine, Ser 0.81 0.61 - 1.24 mg/dL   Calcium 9.4 8.9 - 10.3 mg/dL   Total Protein 7.4 6.5 - 8.1 g/dL   Albumin 4.1 3.5 - 5.0 g/dL   AST 27 15 - 41 U/L   ALT 27 17 - 63 U/L   Alkaline Phosphatase 57 38 - 126 U/L   Total Bilirubin 1.0 0.3 - 1.2 mg/dL   GFR calc non Af Amer >60 >60 mL/min   GFR calc Af Amer >60 >60 mL/min    Comment: (NOTE) The eGFR has been calculated using the CKD EPI equation. This calculation has not been validated in all clinical situations. eGFR's persistently <60 mL/min signify possible Chronic Kidney Disease.    Anion gap 9 5 - 15  Ethanol     Status: None   Collection Time: 08/18/17  5:00 PM  Result Value Ref Range   Alcohol, Ethyl (B) <10 <10 mg/dL    Comment:        LOWEST DETECTABLE LIMIT FOR SERUM ALCOHOL IS 10 mg/dL FOR MEDICAL PURPOSES ONLY   CBC with Diff     Status: None   Collection Time: 08/18/17  5:00 PM  Result Value Ref Range   WBC 8.8 4.0 - 10.5 K/uL   RBC 5.35 4.22 - 5.81 MIL/uL   Hemoglobin 15.9 13.0 - 17.0 g/dL   HCT 47.3 39.0 - 52.0 %   MCV 88.4 78.0 - 100.0 fL   MCH 29.7 26.0 - 34.0 pg   MCHC 33.6 30.0 - 36.0 g/dL   RDW 13.1 11.5 - 15.5 %   Platelets 216 150 - 400 K/uL   Neutrophils Relative % 58 %   Neutro Abs 5.2 1.7 - 7.7 K/uL   Lymphocytes Relative 29 %   Lymphs Abs 2.5 0.7 - 4.0 K/uL   Monocytes Relative 10 %   Monocytes Absolute 0.8 0.1 - 1.0 K/uL   Eosinophils Relative 2 %   Eosinophils Absolute 0.2 0.0 - 0.7 K/uL    Basophils Relative 1 %   Basophils Absolute 0.1 0.0 -  0.1 K/uL  Urine rapid drug screen (hosp performed)     Status: Abnormal   Collection Time: 08/18/17  7:42 PM  Result Value Ref Range   Opiates NONE DETECTED NONE DETECTED   Cocaine NONE DETECTED NONE DETECTED   Benzodiazepines POSITIVE (A) NONE DETECTED   Amphetamines NONE DETECTED NONE DETECTED   Tetrahydrocannabinol NONE DETECTED NONE DETECTED   Barbiturates NONE DETECTED NONE DETECTED    Comment:        DRUG SCREEN FOR MEDICAL PURPOSES ONLY.  IF CONFIRMATION IS NEEDED FOR ANY PURPOSE, NOTIFY LAB WITHIN 5 DAYS.        LOWEST DETECTABLE LIMITS FOR URINE DRUG SCREEN Drug Class       Cutoff (ng/mL) Amphetamine      1000 Barbiturate      200 Benzodiazepine   408 Tricyclics       144 Opiates          300 Cocaine          300 THC              50   Prolactin     Status: Abnormal   Collection Time: 08/19/17 11:55 AM  Result Value Ref Range   Prolactin 42.9 (H) 4.0 - 15.2 ng/mL    Comment: (NOTE) Performed At: [facility] [place], [place] [id number] Rush Farmer MD [id number]     Blood Alcohol level:  Lab Results  Component Value Date   [facility] <10 08/18/2017   ETH <10 27/74/1287    Metabolic Disorder Labs:  Lab Results  Component Value Date   HGBA1C 5.5 06/25/2017   MPG 111.15 06/25/2017   Lab Results  Component Value Date   PROLACTIN 42.9 (H) 08/19/2017   PROLACTIN 27.2 (H) 06/25/2017   Lab Results  Component Value Date   CHOL 198 06/25/2017   TRIG 183 (H) 06/25/2017   HDL 35 (L) 06/25/2017   CHOLHDL 5.7 06/25/2017   VLDL 37 06/25/2017   LDLCALC 126 (H) 06/25/2017    Current Medications: Current Facility-Administered Medications  Medication Dose Route Frequency Provider Last Rate Last Dose  . acetaminophen (TYLENOL) tablet 650 mg  650 mg Oral Q6H PRN Ethelene Hal, NP   650 mg at 08/19/17 2213  . alum & mag hydroxide-simeth (MAALOX/MYLANTA) 200-200-20 MG/5ML  suspension 30 mL  30 mL Oral Q4H PRN Ethelene Hal, NP      . hydrOXYzine (ATARAX/VISTARIL) tablet 25 mg  25 mg Oral Q6H PRN Ethelene Hal, NP      . magnesium hydroxide (MILK OF MAGNESIA) suspension 30 mL  30 mL Oral Daily PRN Ethelene Hal, NP      . Derrill Memo ON 08/27/2017] paliperidone (INVEGA SUSTENNA) injection 156 mg  156 mg Intramuscular Q30 days Maris Berger T, MD      . paliperidone (INVEGA) 24 hr tablet 9 mg  9 mg Oral QHS Ethelene Hal, NP   9 mg at 08/19/17 2026  . risperiDONE (RISPERDAL M-TABS) disintegrating tablet 2 mg  2 mg Oral Q8H PRN Ethelene Hal, NP      . traZODone (DESYREL) tablet 50 mg  50 mg Oral QHS PRN Ethelene Hal, NP       PTA Medications: Medications Prior to Admission  Medication Sig Dispense Refill Last Dose  . paliperidone (INVEGA) 9 MG 24 hr tablet Take 1 tablet (9 mg total) by mouth at bedtime. For mood control 30 tablet 0 08/17/2017 at Unknown time  Musculoskeletal: Strength & Muscle Tone: within normal limits Gait & Station: normal Patient leans: N/A  Psychiatric Specialty Exam: Physical Exam  Nursing note and vitals reviewed.   Review of Systems  Constitutional: Negative for chills and fever.  Respiratory: Negative for cough.   Cardiovascular: Negative for chest pain.  Psychiatric/Behavioral: Positive for hallucinations and substance abuse. Negative for depression and suicidal ideas.    Blood pressure 108/84, pulse 78, temperature 98.8 F (37.1 C), temperature source Oral, resp. rate 17, height 6' 1"  (1.854 m), weight 99.3 kg (219 lb), SpO2 100 %.Body mass index is 28.89 kg/m.  General Appearance: Casual and Fairly Groomed  Eye Contact:  Good  Speech:  Clear and Coherent and Normal Rate  Volume:  Increased  Mood:  Anxious and Irritable  Affect:  Blunt, Congruent and Labile  Thought Process:  Coherent, Disorganized and Descriptions of Associations: Loose  Orientation:  Full (Time,  Place, and Person)  Thought Content:  Delusions, Hallucinations: Auditory and Paranoid Ideation  Suicidal Thoughts:  No  Homicidal Thoughts:  No  Memory:  Immediate;   Fair Recent;   Fair Remote;   Fair  Judgement:  Fair  Insight:  Lacking  Psychomotor Activity:  Normal  Concentration:  Concentration: Good  Recall:  [place] of Knowledge:  Fair  Language:  Fair  Akathisia:  No  Handed:    AIMS (if indicated):     Assets:  Communication Skills Resilience Social Support Talents/Skills  ADL's:  Intact  Cognition:  WNL  Sleep:  Number of Hours: 6.75    Treatment Plan Summary: Daily contact with patient to assess and evaluate symptoms and progress in treatment and Medication management  Observation Level/Precautions:  15 minute checks  Laboratory:  CBC Chemistry Profile HbAIC UDS  Psychotherapy:  Encourage participation in groups and therapeutic milieu  Medications:  Restart Invega 21m qDay, start [id number] once now and in 7 days start Sustenna [id number] days  Consultations:    Discharge Concerns:    Estimated LOS: 5-7 days  Other:     Physician Treatment Plan for Primary Diagnosis: Schizophrenia, paranoid type ([place] Term Goal(s): Improvement in symptoms so as ready for discharge  Short Term Goals: Ability to identify changes in lifestyle to reduce recurrence of condition will improve and Ability to identify and develop effective coping behaviors will improve  Physician Treatment Plan for Secondary Diagnosis: Principal Problem:   Schizophrenia, paranoid type ([place] Term Goal(s): Improvement in symptoms so as ready for discharge  Short Term Goals: Ability to demonstrate self-control will improve and Ability to identify triggers associated with substance abuse/mental health issues will improve  I certify that inpatient services furnished can reasonably be expected to improve the patient's condition.    ChrPennelope BrackenMD 12/13/201812:21 PM

## 2017-08-21 MED ORDER — DIPHENHYDRAMINE HCL 25 MG PO CAPS
25.0000 mg | ORAL_CAPSULE | Freq: Every evening | ORAL | Status: DC | PRN
  Administered 2017-08-21 – 2017-08-24 (×3): 25 mg via ORAL
  Filled 2017-08-21: qty 1
  Filled 2017-08-21: qty 7
  Filled 2017-08-21 (×3): qty 1

## 2017-08-21 MED ORDER — HYDROCORTISONE 2.5 % RE CREA
TOPICAL_CREAM | Freq: Three times a day (TID) | RECTAL | Status: DC | PRN
  Administered 2017-08-21: 15:00:00 via RECTAL
  Filled 2017-08-21: qty 28.35

## 2017-08-21 NOTE — BHH Counselor (Signed)
Adult Comprehensive Assessment  Patient ID: [name], male   DOB: 01/02/1971, 46 y.o.   MRN: [id number]    Current Stressors:  Employment / Job issues: on disability, work on Radio producer, rebuild Environmental manager / Lack of resources (include bankruptcy): on fixed income, plus small additional income  Living/Environment/Situation:  Living Arrangements: Alone Living conditions (as described by patient or guardian): Pt lives alone in [place] in an apartment.  How long has patient lived in current situation?:11years What is atmosphere in current home: Comfortable  Family History:  Marital status: Widowed Widowed, when?: 2005 Does patient have children?: Yes How many children?: 1 How is patient's relationship with their children?: Pt has a 4year old son - pt's mom has full custody of him. Pt reports he is working on his relationship with his son.   Childhood History:  By whom was/is the patient raised?: Mother/father and step-parent;Mother Additional childhood history information: Pt reports being raised by mother and step father and reports step father was a "pot head". Pt describes his childhood as good.  Description of patient's relationship with caregiver when they were a child: Pt reports getting along well with parents growing up.  Patient's description of current relationship with people who raised him/her: Pt reports having a"so-so"relationship with mother-"we arguea lot." Father is out of prison-relationship is weird-"I hear his voicea lot" Does patient have siblings?: Yes Number of Siblings: 2 Description of patient's current relationship with siblings: reports having a strained relationship with brother, decent relationship with sister Did patient suffer any verbal/emotional/physical/sexual abuse as a child?: No Did patient suffer from severe childhood neglect?: No Has patient ever been sexually abused/assaulted/raped as an adolescent or adult?:  No Was the patient ever a victim of a crime or a disaster?: No Witnessed domestic violence?: No Has patient been effected by domestic violence as an adult?: No  Education:  Highest grade of school patient has completed: some college Currently a Ship broker?: No Learning disability?: No  Employment/Work Situation:  Employment situation: On disability Why is patient on disability: mental health How long has patient been on disability: since 46 Patient's job has been impacted by current illness: No What is the longest time patient has a held a job?: 10 years 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 Has patient ever been in the [employer]?: No Has patient ever served in Recruitment consultant?: No  Financial Resources:  Museum/gallery curator resources: Public affairs consultant;Food stamps Does patient have a representative payee or guardian?: No  Alcohol/Substance Abuse:  What has been your use of drugs/alcohol within the last 12 months?: Pt denies alcohol and drug abuse If attempted suicide, did drugs/alcohol play a role in this?: No Alcohol/Substance Abuse Treatment Hx: Denies past history If yes, describe treatment: N/A Has alcohol/substance abuse ever caused legal problems?: No  Social Support System: Heritage manager System:Poor Describe Community Support System:Reports no support Type of faith/religion: Christian How does patient's faith help to cope with current illness?: prayer, occasional church attendance  Leisure/Recreation:  Leisure and Hobbies: play video games, work on Land:  What things does the patient do well?: make people laugh, rebuild computers In what areas does patient struggle / problems for patient: A/V hallucination  Discharge Plan:  Does patient have access to transportation?: Yes Will patient be returning to same living situation after discharge?: Yes Currently receiving community mental health  services: Yes [facility] If no, would patient like referral for services when discharged?:  Does patient have financial barriers  related to discharge medications?: No   Summary/Recommendations:   Summary and Recommendations (to be completed by the evaluator): [name] is a 46 YO Caucasian male diagnosed with Schizoaffective Disorder Bipolar type. He presents IVC'd by his mother 7 weeks after his previous stay with [place] due to non-compliance with medicaiton and accompanying symmptoms of disorganization and AH. [name] states he "lost about 10 pills" which is the non-compliance his mother is referring to. At d/c, he will return home and follow up with Dr Reece Levy at [facility].  While here, [name] can benefit from crises stabilization, medication management, therapeutic milieu and coordination with outpt provider.  Trish Mage. 08/21/2017

## 2017-08-21 NOTE — BHH Suicide Risk Assessment (Addendum)
[facility] INPATIENT:  Family/Significant Other Suicide Prevention Education  Suicide Prevention Education:  Education Completed; No one has been identified by the patient as the family member/significant other with whom the patient will be residing, and identified as the person(s) who will aid the patient in the event of a mental health crisis (suicidal ideations/suicide attempt).  With written consent from the patient, the family member/significant other has been provided the following suicide prevention education, prior to the and/or following the discharge of the patient.  The suicide prevention education provided includes the following:  Suicide risk factors  Suicide prevention and interventions  National Suicide Hotline telephone number  [facility] assessment telephone number  [facility] Emergency Assistance [place] and/or Residential Mobile Crisis Unit telephone number  Request made of family/significant other to:  Remove weapons (e.g., guns, rifles, knives), all items previously/currently identified as safety concern.    Remove drugs/medications (over-the-counter, prescriptions, illicit drugs), all items previously/currently identified as a safety concern.  The family member/significant other verbalizes understanding of the suicide prevention education information provided.  The family member/significant other agrees to remove the items of safety concern listed above. The patient did not endorse SI at the time of admission, nor did the patient c/o SI during the stay here.  SPE not required.  Spoke with mother, Ms Myrtice Lauth, [phone], and went over treatment team recommendations and crises plan.  She would like him to have the support of an ACT tdam, but he continually rejects that when she talks to him about it.  She has been visiting him daily, and is concerned about the amount to which he is RIS/talking to himself. Trish Mage 08/21/2017, 1:38 PM

## 2017-08-21 NOTE — Progress Notes (Signed)
DAR NOTE: Patient presents with anxious, irritable affect and labile mood.  Denies suicidal thoughts and visual hallucination but reports auditory hallucination.  Rates depression at 0, hopelessness at 0, and anxiety at 2.  Maintained on routine safety checks.  Medications given as prescribed.  Support and encouragement offered as needed.  Attended group and participated.  States goal for today is "discharge."  Patient visible in the dayroom and is seen talking to staff at the nursing station.  Complain of hemorrhoid pain.  Anusol cream offered.  Patient report good effect.   Vistaril 25 mg given for complain of anxiety with good effect.

## 2017-08-21 NOTE — Progress Notes (Signed)
[facility] MD Progress Note  08/21/2017 11:26 AM [name]  MRN:  [id number] Subjective:  [name] is a 46 y/o M with history of schizophrenia who was admitted on IVC placed by his mother due to worsening psychosis, irritability, poor medication adherence, and illicit use of alprazolam. Pt was restarted on oral form of Invega and he was in agreement to be transitioned to [place], which he received his first injection yesterday.  Today upon evaluation, pt reports he is feeling better. He states, "I'm calm today; it didn't really go as bad as I thought it would." Pt continues to endorse AH, stating, "I'm hearing voices but I'm not talking to them as much." Pt also reports some vague VH of feeling that he was "part of a crucifixion and I felt like I was Jesus" yesterday evening, but pt thinks that he may have been drifting off to sleep at that time. He denies SI/HI. He feels that overall his symptoms have improved since admission, and he states that he does not feel he needs to use alprazolam. He would like to try something other than trazodone as a sleep aid, and he requests for benadryl at a low dose. Discussed with patient that we will plan to monitor him over the weekend and likely give booster injection of Invega early next week with potential plan to discharge to outpatient level of care if he continues to have improvement. Pt was in agreement with the above plan and he had no further questions, comments, or concerns.   Principal Problem: Schizophrenia, paranoid type ([place]) Diagnosis:   Patient Active Problem List   Diagnosis Date Noted  . Schizophrenia, paranoid type ([place]) [F20.0] 08/19/2017  . Hallucinations [R44.3] 07/29/2014  . Hypokalemia [E87.6] 04/28/2014  . Drug overdose [T50.901A] 04/27/2014  . Schizoaffective disorder, bipolar type ([place]) [F25.0] 03/02/2014  . Anxiety state [F41.1] 03/02/2014  . Benzodiazepine dependence ([place]) [F13.20] 03/02/2014  . Chest pain [R07.9] 10/11/2013   . EKG abnormalities [R94.31] 10/11/2013  . Chronic back pain [M54.9, G89.29]    Total Time spent with patient: 30 minutes  Past Psychiatric History: see H&P  Past Medical History:  Past Medical History:  Diagnosis Date  . Asthma   . CHF (congestive heart failure) ([place])   . Chronic back pain   . COPD (chronic obstructive pulmonary disease) ([place])   . Knee pain, chronic   . Migraines   . Schizophrenia, schizo-affective ([place])     Past Surgical History:  Procedure Laterality Date  . extraction of wisdom teeth    . KNEE SURGERY     four times   Family History: History reviewed. No pertinent family history. Family Psychiatric  History: see H&P Social History:  Social History   Substance and Sexual Activity  Alcohol Use No   Comment: occ     Social History   Substance and Sexual Activity  Drug Use No    Social History   Socioeconomic History  . Marital status: Single    Spouse name: None  . Number of children: None  . Years of education: None  . Highest education level: None  Social Needs  . Financial resource strain: None  . Food insecurity - worry: None  . Food insecurity - inability: None  . Transportation needs - medical: None  . Transportation needs - non-medical: None  Occupational History  . None  Tobacco Use  . Smoking status: Current Every Day Smoker    Packs/day: 2.00    Years: 10.00  Pack years: 20.00    Types: Cigarettes  . Smokeless tobacco: Never Used  Substance and Sexual Activity  . Alcohol use: No    Comment: occ  . Drug use: No  . Sexual activity: No  Other Topics Concern  . None  Social History Narrative  . None   Additional Social History:                         Sleep: Fair  Appetite:  Fair  Current Medications: Current Facility-Administered Medications  Medication Dose Route Frequency Provider Last Rate Last Dose  . acetaminophen (TYLENOL) tablet 650 mg  650 mg Oral Q6H PRN Ethelene Hal, NP   650 mg  at 08/20/17 1520  . alum & mag hydroxide-simeth (MAALOX/MYLANTA) 200-200-20 MG/5ML suspension 30 mL  30 mL Oral Q4H PRN Ethelene Hal, NP      . diphenhydrAMINE (BENADRYL) capsule 25 mg  25 mg Oral QHS PRN Pennelope Bracken, MD      . hydrOXYzine (ATARAX/VISTARIL) tablet 25 mg  25 mg Oral Q6H PRN Ethelene Hal, NP      . magnesium hydroxide (MILK OF MAGNESIA) suspension 30 mL  30 mL Oral Daily PRN Ethelene Hal, NP      . Derrill Memo ON 08/27/2017] paliperidone (INVEGA SUSTENNA) injection 156 mg  156 mg Intramuscular Q30 days Maris Berger T, MD      . paliperidone (INVEGA) 24 hr tablet 9 mg  9 mg Oral QHS Ethelene Hal, NP   9 mg at 08/20/17 2101  . risperiDONE (RISPERDAL M-TABS) disintegrating tablet 2 mg  2 mg Oral Q8H PRN Ethelene Hal, NP        Lab Results:  Results for orders placed or performed during the hospital encounter of 08/18/17 (from the past 48 hour(s))  Prolactin     Status: Abnormal   Collection Time: 08/19/17 11:55 AM  Result Value Ref Range   Prolactin 42.9 (H) 4.0 - 15.2 ng/mL    Comment: (NOTE) Performed At: [facility] [place], [place] [id number] Rush Farmer MD [id number]     Blood Alcohol level:  Lab Results  Component Value Date   [facility] <10 08/18/2017   ETH <10 70/35/0093    Metabolic Disorder Labs: Lab Results  Component Value Date   HGBA1C 5.5 06/25/2017   MPG 111.15 06/25/2017   Lab Results  Component Value Date   PROLACTIN 42.9 (H) 08/19/2017   PROLACTIN 27.2 (H) 06/25/2017   Lab Results  Component Value Date   CHOL 198 06/25/2017   TRIG 183 (H) 06/25/2017   HDL 35 (L) 06/25/2017   CHOLHDL 5.7 06/25/2017   VLDL 37 06/25/2017   LDLCALC 126 (H) 06/25/2017    Physical Findings: AIMS: Facial and Oral Movements Muscles of Facial Expression: None, normal Lips and Perioral Area: None, normal Jaw: None, normal Tongue: None, normal,Extremity Movements Upper  (arms, wrists, hands, fingers): None, normal Lower (legs, knees, ankles, toes): None, normal, Trunk Movements Neck, shoulders, hips: None, normal, Overall Severity Severity of abnormal movements (highest score from questions above): None, normal Incapacitation due to abnormal movements: None, normal Patient's awareness of abnormal movements (rate only patient's report): No Awareness, Dental Status Current problems with teeth and/or dentures?: No Does patient usually wear dentures?: No  CIWA:    COWS:     Musculoskeletal: Strength & Muscle Tone: within normal limits Gait & Station: normal Patient leans: N/A  Psychiatric Specialty Exam: Physical  Exam  Nursing note and vitals reviewed.   Review of Systems  Constitutional: Negative for chills and fever.  Respiratory: Negative for cough and shortness of breath.   Cardiovascular: Negative for chest pain.  Gastrointestinal: Negative for abdominal pain, heartburn, nausea and vomiting.  Psychiatric/Behavioral: Positive for hallucinations. Negative for depression and suicidal ideas. The patient is not nervous/anxious.     Blood pressure 103/74, pulse 98, temperature 99 F (37.2 C), temperature source Oral, resp. rate 16, height 6\' 1"  (1.854 m), weight 99.3 kg (219 lb), SpO2 100 %.Body mass index is 28.89 kg/m.  General Appearance: Casual and Fairly Groomed  Eye Contact:  Good  Speech:  Clear and Coherent and Normal Rate  Volume:  Normal  Mood:  Anxious and Irritable  Affect:  Congruent and Constricted  Thought Process:  Coherent, Goal Directed and Descriptions of Associations: Loose  Orientation:  Full (Time, Place, and Person)  Thought Content:  Hallucinations: Auditory Visual  Suicidal Thoughts:  No  Homicidal Thoughts:  No  Memory:  Immediate;   Good Recent;   Good Remote;   Good  Judgement:  Fair  Insight:  Fair  Psychomotor Activity:  Normal  Concentration:  Concentration: Fair  Recall:  [employer] of Knowledge:  Fair   Language:  Good  Akathisia:  No  Handed:    AIMS (if indicated):     Assets:  Communication Skills Leisure Time Physical Health Resilience  ADL's:  Intact  Cognition:  WNL  Sleep:  Number of Hours: 4     Treatment Plan Summary: Daily contact with patient to assess and evaluate symptoms and progress in treatment and Medication management. Pt reports improvement of irritability, anxiety, and AH with initialization of Invega Sustenna yesterday. He agrees to continue on current regimen with plan to have [place] booster injection early next week. He requests benadryl for insomnia.  - continue inpatient hospitalization  - Schizophrenia             - Continue Invega 9 mg qDay             - Invega Sustenna 234mg  IM given on 08/20/17; plan for Sustenna 156mg  q28 days starting on 08/27/17 +/- 4 days.    - Agitation                    - continue risperdal M-tab 2mg  q8h prn agitation  - Insomnia             -Discontinue trazodone 50mg  qhs prn insomnia    - Start benadryl 25mg  qhs prn insomnia  -anxiety             - Continue atarax 25mg  q6h prn anxiety  -Encourage participation in groups and therapeutic milieu -Discharge planning will be ongoing    Pennelope Bracken, MD 08/21/2017, 11:26 AM

## 2017-08-21 NOTE — Progress Notes (Signed)
Patient has been up and active on the unit, attended group this evening and participated. His mother and son visited on tonight. Writer spoke with him 1:1 and informed him of his scheduled medication and prns available. He has been very restless and fidgety tonight even after requesting several prns. Patient currently denies si/hi and visual hallucinations. He does report auditory hallucinations with muffled voices. Support and encouragement offered, safety maintained on unit, will continue to monitor.

## 2017-08-21 NOTE — Progress Notes (Signed)
Recreation Therapy Notes  Date: 08/21/14 Time: 1000 Location: 500 Hall Dayroom  Group Topic: Wellness  Goal Area(s) Addresses:  Patient will define components of whole wellness. Patient will verbalize benefit of whole wellness.  Behavioral Response: Engaged  Intervention:  Chairs, small beach ball  Activity: Keep It Chartered certified accountant.  Patients were seated in a circle in the dayroom.  Patients were to pass the ball back and forth to each other.  Patients could bounce the balls off the floor but the ball could not come to a complete stop.  LRT counted the number of hits the patients made on the ball.  If the ball came to a stop, the count would start over.    Education: Wellness, Dentist.   Education Outcome: Acknowledges education/In group clarification offered/Needs additional education.   Clinical Observations/Feedback: Pt was active and showed good hand eye coordination.  Pt was social with peers and very engaged.  Pt was pleasant and on task throughout group.   Victorino Sparrow, LRT/CTRS     Ria Comment, Fortunato Nordin A 08/21/2017 10:53 AM

## 2017-08-21 NOTE — Progress Notes (Signed)
Adult Psychoeducational Group Note  Date:  08/21/2017 Time:  9:49 PM  Group Topic/Focus:  Wrap-Up Group:   The focus of this group is to help patients review their daily goal of treatment and discuss progress on daily workbooks.  Participation Level:  Minimal  Participation Quality:  Appropriate  Affect:  Flat  Cognitive:  Oriented  Insight: Lacking  Engagement in Group:  Engaged  Modes of Intervention:  Socialization and Support  Additional Comments:  Patient attended and participated in group tonight. He reports that his day was OK. He went for his meals, and groups. He was hearing voices. He told his nurse and got some medication. He had some visitors today.  Salley Scarlet [facility] 08/21/2017, 9:49 PM

## 2017-08-22 DIAGNOSIS — G4700 Insomnia, unspecified: Secondary | ICD-10-CM

## 2017-08-22 DIAGNOSIS — R443 Hallucinations, unspecified: Secondary | ICD-10-CM

## 2017-08-22 DIAGNOSIS — R451 Restlessness and agitation: Secondary | ICD-10-CM

## 2017-08-22 NOTE — BHH Group Notes (Signed)
Adult Psychoeducational Group Note  Date:  08/22/2017 Time:  8:43 PM  Group Topic/Focus:  Wrap-Up Group:   The focus of this group is to help patients review their daily goal of treatment and discuss progress on daily workbooks.  Participation Level:  Active  Participation Quality:  Appropriate  Affect:  Appropriate  Cognitive:  Alert and Appropriate  Insight: Appropriate  Engagement in Group:  Engaged  Modes of Intervention:  Discussion  Additional Comments:  Pt was attentive and appropriate during tonight's wrap up group. Pt was able to share that he was able to sleep due to medication. Pt shared he had family to come and visit.   Theodoro Grist D 08/22/2017, 8:43 PM

## 2017-08-22 NOTE — Progress Notes (Addendum)
D Patient has been asleep in his bed most of his day. A HE did complete his daily assessment and on this he wrote  He denied having SI today. He rated his depression, hopelessness and anxeity " 0/0/0", respectively. When asked how he is doing today he said " see...yall are giving me so much medicine I'm sleeping all the time now..thats alll you want is for me to sleep in the bed...".Staff notes he remains disorganized in thought and speech process and this is evidenced in the manner that he jumps from one subject to another  And then back to the original subject.  Pt denies active AH . His affect is flat. Depressed. R Safety in place. Pt compliant with medications schedule and denies adverse side effects.

## 2017-08-22 NOTE — BHH Group Notes (Signed)
[place] Group Notes: (Clinical Social Work)   08/22/2017      Type of Therapy:  Group Therapy   Participation Level:  Did Not Attend despite MHT prompting   Selmer Dominion, LCSW 08/22/2017, 1:11 PM

## 2017-08-22 NOTE — Progress Notes (Signed)
[facility] MD Progress Note  08/22/2017 9:01 AM [name]  MRN:  [id number]   Subjective:  [name] reports " I am not feeling good today"   Objective:  [name] is awake, alert. Seen resting in bedroom. 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 Windows, Internet explore. That's how I feel." Denies suicidal or homicidal ideation. Reports auditory hallucination that has improved some with medications.  Patient present with a flat irritable and guarded affect.Patient reports he is medication compliant without mediation side effects. Support, encouragement and reassurance was provided.    Principal Problem: Schizophrenia, paranoid type ([place]) Diagnosis:   Patient Active Problem List   Diagnosis Date Noted  . Schizophrenia, paranoid type ([place]) [F20.0] 08/19/2017  . Hallucinations [R44.3] 07/29/2014  . Hypokalemia [E87.6] 04/28/2014  . Drug overdose [T50.901A] 04/27/2014  . Schizoaffective disorder, bipolar type ([place]) [F25.0] 03/02/2014  . Anxiety state [F41.1] 03/02/2014  . Benzodiazepine dependence ([place]) [F13.20] 03/02/2014  . Chest pain [R07.9] 10/11/2013  . EKG abnormalities [R94.31] 10/11/2013  . Chronic back pain [M54.9, G89.29]    Total Time spent with patient: 30 minutes  Past Psychiatric History: see H&P  Past Medical History:  Past Medical History:  Diagnosis Date  . Asthma   . CHF (congestive heart failure) ([place])   . Chronic back pain   . COPD (chronic obstructive pulmonary disease) ([place])   . Knee pain, chronic   . Migraines   . Schizophrenia, schizo-affective ([place])     Past Surgical History:  Procedure Laterality Date  . extraction of wisdom teeth    . KNEE SURGERY     four times   Family History: History reviewed. No pertinent family history. Family Psychiatric  History: see H&P Social History:  Social History   Substance and Sexual Activity  Alcohol Use No   Comment: occ     Social History   Substance and Sexual  Activity  Drug Use No    Social History   Socioeconomic History  . Marital status: Single    Spouse name: None  . Number of children: None  . Years of education: None  . Highest education level: None  Social Needs  . Financial resource strain: None  . Food insecurity - worry: None  . Food insecurity - inability: None  . Transportation needs - medical: None  . Transportation needs - non-medical: None  Occupational History  . None  Tobacco Use  . Smoking status: Current Every Day Smoker    Packs/day: 2.00    Years: 10.00    Pack years: 20.00    Types: Cigarettes  . Smokeless tobacco: Never Used  Substance and Sexual Activity  . Alcohol use: No    Comment: occ  . Drug use: No  . Sexual activity: No  Other Topics Concern  . None  Social History Narrative  . None   Additional Social History:                         Sleep: Fair  Appetite:  Fair  Current Medications: Current Facility-Administered Medications  Medication Dose Route Frequency Provider Last Rate Last Dose  . acetaminophen (TYLENOL) tablet 650 mg  650 mg Oral Q6H PRN Ethelene Hal, NP   650 mg at 08/21/17 2155  . alum & mag hydroxide-simeth (MAALOX/MYLANTA) 200-200-20 MG/5ML suspension 30 mL  30 mL Oral Q4H PRN Ethelene Hal, NP      . diphenhydrAMINE (BENADRYL)  capsule 25 mg  25 mg Oral QHS PRN Pennelope Bracken, MD   25 mg at 08/21/17 2155  . hydrocortisone (ANUSOL-HC) 2.5 % rectal cream   Rectal TID PRN Pennelope Bracken, MD      . hydrOXYzine (ATARAX/VISTARIL) tablet 25 mg  25 mg Oral Q6H PRN Ethelene Hal, NP   25 mg at 08/22/17 0001  . magnesium hydroxide (MILK OF MAGNESIA) suspension 30 mL  30 mL Oral Daily PRN Ethelene Hal, NP      . Derrill Memo ON 08/27/2017] paliperidone (INVEGA SUSTENNA) injection 156 mg  156 mg Intramuscular Q30 days Maris Berger T, MD      . paliperidone (INVEGA) 24 hr tablet 9 mg  9 mg Oral QHS Ethelene Hal,  NP   9 mg at 08/21/17 2052  . risperiDONE (RISPERDAL M-TABS) disintegrating tablet 2 mg  2 mg Oral Q8H PRN Ethelene Hal, NP   2 mg at 08/21/17 6269    Lab Results:  No results found for this or any previous visit (from the past 79 hour(s)).  Blood Alcohol level:  Lab Results  Component Value Date   ETH <10 08/18/2017   ETH <10 48/54/6270    Metabolic Disorder Labs: Lab Results  Component Value Date   HGBA1C 5.5 06/25/2017   MPG 111.15 06/25/2017   Lab Results  Component Value Date   PROLACTIN 42.9 (H) 08/19/2017   PROLACTIN 27.2 (H) 06/25/2017   Lab Results  Component Value Date   CHOL 198 06/25/2017   TRIG 183 (H) 06/25/2017   HDL 35 (L) 06/25/2017   CHOLHDL 5.7 06/25/2017   VLDL 37 06/25/2017   LDLCALC 126 (H) 06/25/2017    Physical Findings: AIMS: Facial and Oral Movements Muscles of Facial Expression: None, normal Lips and Perioral Area: None, normal Jaw: None, normal Tongue: None, normal,Extremity Movements Upper (arms, wrists, hands, fingers): None, normal Lower (legs, knees, ankles, toes): None, normal, Trunk Movements Neck, shoulders, hips: None, normal, Overall Severity Severity of abnormal movements (highest score from questions above): None, normal Incapacitation due to abnormal movements: None, normal Patient's awareness of abnormal movements (rate only patient's report): No Awareness, Dental Status Current problems with teeth and/or dentures?: No Does patient usually wear dentures?: No  CIWA:    COWS:     Musculoskeletal: Strength & Muscle Tone: within normal limits Gait & Station: normal Patient leans: N/A  Psychiatric Specialty Exam: Physical Exam  Nursing note and vitals reviewed. Constitutional: He appears well-developed.  Cardiovascular: Normal rate.  Neurological: He is alert.  Psychiatric: He has a normal mood and affect. His behavior is normal.    Review of Systems  Psychiatric/Behavioral: Positive for hallucinations.  Negative for depression and suicidal ideas. The patient is not nervous/anxious.     Blood pressure 103/74, pulse 98, temperature 99 F (37.2 C), temperature source Oral, resp. rate 16, height 6\' 1"  (1.854 m), weight 99.3 kg (219 lb), SpO2 100 %.Body mass index is 28.89 kg/m.  General Appearance: Casual, guarded and irritable   Eye Contact:  Good  Speech:  Clear and Coherent and Normal Rate  Volume:  Normal  Mood:  Anxious and Irritable  Affect:  Congruent and Constricted  Thought Process:  Coherent, Goal Directed and Descriptions of Associations: Loose  Orientation:  Full (Time, Place, and Person)  Thought Content:  Hallucinations: Auditory Visual  Visual hallucination was denied during this assessment and reports auditory hallucination are improving   Suicidal Thoughts:  No  Homicidal Thoughts:  No  Memory:  Immediate;   Good Recent;   Good Remote;   Good  Judgement:  Fair  Insight:  Fair  Psychomotor Activity:  Normal  Concentration:  Concentration: Fair  Recall:  [place] of Knowledge:  Fair  Language:  Good  Akathisia:  No  Handed:    AIMS (if indicated):     Assets:  Communication Skills Leisure Time Physical Health Resilience  ADL's:  Intact  Cognition:  WNL  Sleep:  Number of Hours: 2.5    Treatment Plan Summary: Daily contact with patient to assess and evaluate symptoms and progress in treatment and Medication management.  Continue with current treatment plan list below except where noted on 08/22/2017  - Schizophrenia             - Continue Invega 9 mg qDay             - Invega Sustenna 234mg  IM given on 08/20/17; plan for Sustenna 156mg  q28 days starting on 08/27/17 +/- 4 days.    - Agitation                    - continue risperdal M-tab 2mg  q8h prn agitation  - Insomnia             -Discontinue trazodone 50mg  qhs prn insomnia  -Continue benadryl 25mg  qhs prn insomnia  -Anxiety             - Continue atarax 25mg  q6h prn anxiety  -Encourage  participation in groups and therapeutic milieu -Discharge planning will be ongoing    Derrill [place], NP 08/22/2017, 9:01 AM Agree with NP Progress Note

## 2017-08-23 MED ORDER — DOCUSATE SODIUM 100 MG PO CAPS
100.0000 mg | ORAL_CAPSULE | Freq: Every day | ORAL | Status: DC
  Filled 2017-08-23 (×2): qty 1
  Filled 2017-08-23: qty 7
  Filled 2017-08-23 (×2): qty 1

## 2017-08-23 MED ORDER — LORAZEPAM 1 MG PO TABS
2.0000 mg | ORAL_TABLET | ORAL | Status: AC
  Administered 2017-08-23: 2 mg via ORAL

## 2017-08-23 MED ORDER — RISPERIDONE 2 MG PO TBDP
3.0000 mg | ORAL_TABLET | Freq: Three times a day (TID) | ORAL | Status: DC | PRN
  Administered 2017-08-23 – 2017-08-24 (×3): 3 mg via ORAL
  Filled 2017-08-23 (×3): qty 1

## 2017-08-23 MED ORDER — LORAZEPAM 1 MG PO TABS
ORAL_TABLET | ORAL | Status: AC
  Administered 2017-08-23: 2 mg via ORAL
  Filled 2017-08-23: qty 2

## 2017-08-23 MED ORDER — HYDROXYZINE HCL 50 MG PO TABS
50.0000 mg | ORAL_TABLET | Freq: Four times a day (QID) | ORAL | Status: DC | PRN
  Administered 2017-08-23 – 2017-08-25 (×4): 50 mg via ORAL
  Filled 2017-08-23 (×5): qty 1
  Filled 2017-08-23: qty 10

## 2017-08-23 NOTE — Progress Notes (Signed)
Writer has observed patient pacing and irritable shortly after his visitors left tonight. He has been cursing and talking to himself but has been redirectable. He attended group and participated. He requested and was given several prn which helped him to rest for brief periods of time before he is back up again either talking loudly in his room, or clapping loudly disturbing other patients asleep on the hall.   Writer will monitor effectives of prns given. Safety maintained on unit with 15 min checks.

## 2017-08-23 NOTE — Progress Notes (Addendum)
Loud banging sound heard from hallway, patient in shower, RN to room, "I punched the wall, I've washed the same spot a bunch of times, I keep doing it."  Directed patient not to punch wall, RN offered PRN risperidone "I don't want it it doesn't work it makes my fucking epilepsy worse" patient still yelling.  RN contacted NP received order for one time ativan 2mg  to give with PRN risperidone.  Patient out of shower, took PRN risperidone and stat ativan, patient still pacing.  Will follow up for effectiveness.  Addendum- followed up with patient 1725 "that worked really well, I am feeling much better." Calm demeanor, able to stand still, did not appear restless, no shouting bx observed.  Requested MD evaluate patient's hand- patient denies increased pain in right hand which he states is what he hit wall with.  No redness or swelling observed, no impiarment in ROM.

## 2017-08-23 NOTE — Progress Notes (Signed)
Patient restless, irritable, shouting to self at times.  Pacing.  Was just off unit in gym.  Offered PRN for anxiety/agitation, patient declined.  RN said if patient changes his mind to come to RN

## 2017-08-23 NOTE — [facility] Group Notes (Signed)
[facility] Group Notes: (Clinical Social Work)   08/23/2017      Type of Therapy:  Group Therapy   Participation Level:  Did Not Attend despite MHT prompting   Selmer Dominion, LCSW 08/23/2017, 12:25 PM

## 2017-08-23 NOTE — Progress Notes (Signed)
[facility] MD Progress Note  08/23/2017 7:55 AM [name]  MRN:  [id number]   Subjective:  [name] states " I am feeling horrified today."  Objective:  [name]  Reports mild improvement today. States frustration with not able to get a mountain dew to drink at breakfast.Denies suicidal or homicidal ideations. Denies auditory and visual hallucination. Patient continues to present with irritability during this assessment. Per RN notes: Patient pacing and responding to internal stumili during the evening hours. Support, encouragement and reassurance was provided.    Principal Problem: Schizophrenia, paranoid type ([place]) Diagnosis:   Patient Active Problem List   Diagnosis Date Noted  . Schizophrenia, paranoid type ([place]) [F20.0] 08/19/2017  . Hallucinations [R44.3] 07/29/2014  . Hypokalemia [E87.6] 04/28/2014  . Drug overdose [T50.901A] 04/27/2014  . Schizoaffective disorder, bipolar type ([place]) [F25.0] 03/02/2014  . Anxiety state [F41.1] 03/02/2014  . Benzodiazepine dependence ([place]) [F13.20] 03/02/2014  . Chest pain [R07.9] 10/11/2013  . EKG abnormalities [R94.31] 10/11/2013  . Chronic back pain [M54.9, G89.29]    Total Time spent with patient: 30 minutes  Past Psychiatric History: see H&P  Past Medical History:  Past Medical History:  Diagnosis Date  . Asthma   . CHF (congestive heart failure) ([place])   . Chronic back pain   . COPD (chronic obstructive pulmonary disease) ([place])   . Knee pain, chronic   . Migraines   . Schizophrenia, schizo-affective ([place])     Past Surgical History:  Procedure Laterality Date  . extraction of wisdom teeth    . KNEE SURGERY     four times   Family History: History reviewed. No pertinent family history. Family Psychiatric  History: see H&P Social History:  Social History   Substance and Sexual Activity  Alcohol Use No   Comment: occ     Social History   Substance and Sexual Activity  Drug Use No    Social History   Socioeconomic  History  . Marital status: Single    Spouse name: None  . Number of children: None  . Years of education: None  . Highest education level: None  Social Needs  . Financial resource strain: None  . Food insecurity - worry: None  . Food insecurity - inability: None  . Transportation needs - medical: None  . Transportation needs - non-medical: None  Occupational History  . None  Tobacco Use  . Smoking status: Current Every Day Smoker    Packs/day: 2.00    Years: 10.00    Pack years: 20.00    Types: Cigarettes  . Smokeless tobacco: Never Used  Substance and Sexual Activity  . Alcohol use: No    Comment: occ  . Drug use: No  . Sexual activity: No  Other Topics Concern  . None  Social History Narrative  . None   Additional Social History:                         Sleep: Fair  Appetite:  Fair  Current Medications: Current Facility-Administered Medications  Medication Dose Route Frequency Provider Last Rate Last Dose  . acetaminophen (TYLENOL) tablet 650 mg  650 mg Oral Q6H PRN Ethelene Hal, NP   650 mg at 08/21/17 2155  . alum & mag hydroxide-simeth (MAALOX/MYLANTA) 200-200-20 MG/5ML suspension 30 mL  30 mL Oral Q4H PRN Ethelene Hal, NP      . diphenhydrAMINE (BENADRYL) capsule 25 mg  25 mg Oral QHS PRN Maris Berger  T, MD   25 mg at 08/22/17 2045  . hydrocortisone (ANUSOL-HC) 2.5 % rectal cream   Rectal TID PRN Pennelope Bracken, MD      . hydrOXYzine (ATARAX/VISTARIL) tablet 25 mg  25 mg Oral Q6H PRN Ethelene Hal, NP   25 mg at 08/23/17 0029  . magnesium hydroxide (MILK OF MAGNESIA) suspension 30 mL  30 mL Oral Daily PRN Ethelene Hal, NP      . Derrill Memo ON 08/27/2017] paliperidone (INVEGA SUSTENNA) injection 156 mg  156 mg Intramuscular Q30 days Maris Berger T, MD      . paliperidone (INVEGA) 24 hr tablet 9 mg  9 mg Oral QHS Ethelene Hal, NP   9 mg at 08/22/17 2045  . risperiDONE (RISPERDAL  M-TABS) disintegrating tablet 2 mg  2 mg Oral Q8H PRN Ethelene Hal, NP   2 mg at 08/22/17 2045    Lab Results:  No results found for this or any previous visit (from the past 48 hour(s)).  Blood Alcohol level:  Lab Results  Component Value Date   ETH <10 08/18/2017   ETH <10 23/53/6144    Metabolic Disorder Labs: Lab Results  Component Value Date   HGBA1C 5.5 06/25/2017   MPG 111.15 06/25/2017   Lab Results  Component Value Date   PROLACTIN 42.9 (H) 08/19/2017   PROLACTIN 27.2 (H) 06/25/2017   Lab Results  Component Value Date   CHOL 198 06/25/2017   TRIG 183 (H) 06/25/2017   HDL 35 (L) 06/25/2017   CHOLHDL 5.7 06/25/2017   VLDL 37 06/25/2017   LDLCALC 126 (H) 06/25/2017    Physical Findings: AIMS: Facial and Oral Movements Muscles of Facial Expression: None, normal Lips and Perioral Area: None, normal Jaw: None, normal Tongue: None, normal,Extremity Movements Upper (arms, wrists, hands, fingers): None, normal Lower (legs, knees, ankles, toes): None, normal, Trunk Movements Neck, shoulders, hips: None, normal, Overall Severity Severity of abnormal movements (highest score from questions above): None, normal Incapacitation due to abnormal movements: None, normal Patient's awareness of abnormal movements (rate only patient's report): No Awareness, Dental Status Current problems with teeth and/or dentures?: No Does patient usually wear dentures?: No  CIWA:    COWS:     Musculoskeletal: Strength & Muscle Tone: within normal limits Gait & Station: normal Patient leans: N/A  Psychiatric Specialty Exam: Physical Exam  Nursing note and vitals reviewed. Constitutional: He appears well-developed.  Cardiovascular: Normal rate.  Neurological: He is alert.  Psychiatric: He has a normal mood and affect. His behavior is normal.    Review of Systems  Psychiatric/Behavioral: Positive for hallucinations. Negative for depression and suicidal ideas. The patient  is not nervous/anxious.     Blood pressure 103/74, pulse 98, temperature 99 F (37.2 C), temperature source Oral, resp. rate 16, height 6\' 1"  (1.854 m), weight 99.3 kg (219 lb), SpO2 100 %.Body mass index is 28.89 kg/m.  General Appearance: Casual, guarded and irritable   Eye Contact:  Good  Speech:  Clear and Coherent and Normal Rate  Volume:  Normal  Mood:  Anxious and Irritable  Affect:  Congruent and Constricted  Thought Process:  Coherent, Goal Directed and Descriptions of Associations: Loose  Orientation:  Full (Time, Place, and Person)  Thought Content:  Hallucinations: Auditory Visual   Patient continues to denied visual or auditory hallucination however noted, patient was observed responding to internal stimuli .   Suicidal Thoughts:  No  Homicidal Thoughts:  No  Memory:  Immediate;  Good Recent;   Good Remote;   Good  Judgement:  Fair  Insight:  Fair  Psychomotor Activity:  Normal  Concentration:  Concentration: Fair  Recall:  [employer] of Knowledge:  Fair  Language:  Good  Akathisia:  No  Handed:    AIMS (if indicated):     Assets:  Communication Skills Leisure Time Physical Health Resilience  ADL's:  Intact  Cognition:  WNL  Sleep:  Number of Hours: 4.5    Treatment Plan Summary: Daily contact with patient to assess and evaluate symptoms and progress in treatment and Medication management.  Continue with current treatment plan list below except where noted on 08/23/2017  - Schizophrenia             - Continue Invega 9 mg qDay             - Invega Sustenna 234mg  IM given on 08/20/17; plan for Sustenna 156mg  q28 days starting on 08/27/17 +/- 4 days.    - Agitation                    - Increased risperdal M-tab 2mg  to 3mg  q8h prn agitation  - Insomnia             -Discontinue trazodone 50mg  qhs prn insomnia  -Continue benadryl 25mg  qhs prn insomnia  -Anxiety             - Increased atarax 25 mg to  50 mg q6h prn anxiety  -Encourage participation  in groups and therapeutic milieu -Discharge planning will be ongoing    Derrill Center, NP 08/23/2017, 7:55 AM  Agree with NP Progress Note

## 2017-08-23 NOTE — Progress Notes (Signed)
Nursing Note 08/23/2017 [id number]  Data Patient in bed asleep on RN's arrival, asleep until lunch.  Reports sleeping fair with PRN sleep med.  Rates depression 1/10, hopelessness 7/10, and anxiety 7/10. Affect blunted, appropriate.  Denies HI, SI, AVH.  Patient up in afternoon, calm, pleasant, minimal initially.  As afternoon went on into evening patient started pacing, shouting, at one point punched wall in shower (see other note for specifics/interventions/follow ups).  Bx subsided after intervention and patient reported relief.   Action Spoke with patient 1:1, nurse offered support to patient throughout shift. Support/PRN's given per other notes/MAR.  Continues to be monitored on 15 minute checks for safety.  Response Remains safe on unit, more agitated and irritable in late afternoon, but bx subsided after intervention with PRN's/staff support.

## 2017-08-24 MED ORDER — LORAZEPAM 1 MG PO TABS
1.0000 mg | ORAL_TABLET | ORAL | Status: AC
  Administered 2017-08-24: 1 mg via ORAL

## 2017-08-24 MED ORDER — LORAZEPAM 1 MG PO TABS
ORAL_TABLET | ORAL | Status: AC
  Filled 2017-08-24: qty 1

## 2017-08-24 MED ORDER — PALIPERIDONE PALMITATE 156 MG/ML IM SUSP
156.0000 mg | INTRAMUSCULAR | Status: DC
  Administered 2017-08-24: 156 mg via INTRAMUSCULAR
  Filled 2017-08-24: qty 1

## 2017-08-24 NOTE — [facility] Group Notes (Signed)
LCSW Group Therapy Note   08/24/2017 1:15pm   Type of Therapy and Topic:  Group Therapy:  Overcoming Obstacles   Participation Level:  Active   Description of Group:    In this group patients will be encouraged to explore what they see as obstacles to their own wellness and recovery. They will be guided to discuss their thoughts, feelings, and behaviors related to these obstacles. The group will process together ways to cope with barriers, with attention given to specific choices patients can make. Each patient will be challenged to identify changes they are motivated to make in order to overcome their obstacles. This group will be process-oriented, with patients participating in exploration of their own experiences as well as giving and receiving support and challenge from other group members.   Therapeutic Goals: 1. Patient will identify personal and current obstacles as they relate to admission. 2. Patient will identify barriers that currently interfere with their wellness or overcoming obstacles.  3. Patient will identify feelings, thought process and behaviors related to these barriers. 4. Patient will identify two changes they are willing to make to overcome these obstacles:      Summary of Patient Progress   Stayed the entire time, engaged throughout.  Talked about wanting to quit smoking, building on the 6 days he has had smoke free since admission.  When asked about triggers, he identified anger and frustration.  "I usually just reach for a smoke."  When asked about alternatives, he identified "xanax" as something he could reach for instead.  Not willing to take it any further.   Therapeutic Modalities:   Cognitive Behavioral Therapy Solution Focused Therapy Motivational Interviewing Relapse Prevention Therapy  Trish Mage, LCSW 08/24/2017 3:36 PM

## 2017-08-24 NOTE — Progress Notes (Signed)
Mali came to the med window and asked, "Y'all wouldn't happen to have any Ativan for me, would you?" This was approx 1 hr and 40 minutes after receiving 1 mg PRN for agitation.  When told, "No, sir," he said, "That's a shame. I like Ativan."

## 2017-08-24 NOTE — Plan of Care (Signed)
Pt participated in wrap-up group this evening.

## 2017-08-24 NOTE — Progress Notes (Signed)
Mali did not experience relief from PRN Vistaril or Risperdal. He was observed pacing and cursing in the hallway, with earplugs in his ears. "That guy's pissing me off," he said of another patient who was talking out loud and walking nearby. "When you're not mentally ill and you're treated for mental illness, that's when it's really bad," he said. He made other angry, paranoid statements and said he could "flip out." Dr. Nancy Fetter ordered 1 mg Ativan PO. Given. Patient is now resting in his room. Will continue to monitor.

## 2017-08-24 NOTE — Progress Notes (Signed)
Patient grew loud on the phone and hung up loudly. Asked if everything was OK, he requested a PRN. He said, "I was fine until I woke up and went to lunch with a bunch of weirdos." He complained of tingling in his arm and asked why he was feeling restless. Provided support and encouragement as well as PRN 50 mg Vistaril and 3 mg Risperdal PO. Will continue to monitor. Pt is now in dayroom with peers.

## 2017-08-24 NOTE — Progress Notes (Addendum)
  DATA ACTION RESPONSE  Objective- Pt. is visible in dayroom, seen eating a snack. Presents with an irritable/labile affect and mood.Pt took meds and proceeded to go lay down. Subjective- Denies having any SI/HI/AVH at this time. Rates pain 9/10; generalized.Is cooperative and remains safe on the unit.  1:1 interaction in private to establish rapport. Encouragement, education, & support given from staff. PRN benadryl and tylenol requested and given. Will re-eval.     Safety maintained with Q 15 checks. Continue with POC.

## 2017-08-24 NOTE — Progress Notes (Signed)
Adult Psychoeducational Group Note  Date:  08/24/2017 Time:  2:28 AM  Group Topic/Focus:  Wrap-Up Group:   The focus of this group is to help patients review their daily goal of treatment and discuss progress on daily workbooks.  Participation Level:  Minimal  Participation Quality:  Appropriate  Affect:  Appropriate  Cognitive:  Oriented  Insight: Limited  Engagement in Group:  Engaged  Modes of Intervention:  Support  Additional Comments:  Patient attended and participated in group tonight. He reports that his day was OK. He slept part of the day. He went for meals,went to group and went to the gym.He tried. to call his son and his mother but was unable to get in touch. He did spoke with his sister.and got upset from speaking with her.  Salley Scarlet [facility] 08/24/2017, 2:28 AM

## 2017-08-24 NOTE — Progress Notes (Signed)
[facility] MD Progress Note  08/24/2017 1:04 PM [name]  MRN:  [id number]   Subjective:    [name] is a 46 y/o M with history of schizophrenia who was admitted on IVC placed by his mother due to worsening psychosis, irritability, poor medication adherence, and illicit use of alprazolam. Pt was restarted on Invega and transitioned to long-acting injectable form. He has been monitored on the inpatient psychiatry unit for symptom improvement.   Today upon evaluation, pt reports he is doing well overall. When asked about AH and VH, pt reports, "The thoughts and stuff are pretty much gone." He denies SI/HI. He states that he is sleeping well and his appetite is good. He has some ongoing anxiety and paranoia regarding working with an ACT team, and he expresses concern that they will attempt to place him in a group home. He also has some ongoing paranoia that his computer was hacked, which was a stressor prior to coming to the hospital, but he is able to think about other things while he is at the hospital. Discussed with patient that he is doing well overall and we could anticipate his potential discharge in the next couple days, and pt was in agreement to receive [place] booster injection today. Pt had no further questions, comments, or concerns.     Principal Problem: Schizophrenia, paranoid type ([place]) Diagnosis:   Patient Active Problem List   Diagnosis Date Noted  . Schizophrenia, paranoid type ([place]) [F20.0] 08/19/2017  . Hallucinations [R44.3] 07/29/2014  . Hypokalemia [E87.6] 04/28/2014  . Drug overdose [T50.901A] 04/27/2014  . Schizoaffective disorder, bipolar type ([place]) [F25.0] 03/02/2014  . Anxiety state [F41.1] 03/02/2014  . Benzodiazepine dependence ([place]) [F13.20] 03/02/2014  . Chest pain [R07.9] 10/11/2013  . EKG abnormalities [R94.31] 10/11/2013  . Chronic back pain [M54.9, G89.29]    Total Time spent with patient: 30 minutes  Past Psychiatric History: see H&P  Past  Medical History:  Past Medical History:  Diagnosis Date  . Asthma   . CHF (congestive heart failure) ([place])   . Chronic back pain   . COPD (chronic obstructive pulmonary disease) ([place])   . Knee pain, chronic   . Migraines   . Schizophrenia, schizo-affective ([place])     Past Surgical History:  Procedure Laterality Date  . extraction of wisdom teeth    . KNEE SURGERY     four times   Family History: History reviewed. No pertinent family history. Family Psychiatric  History: see H&P Social History:  Social History   Substance and Sexual Activity  Alcohol Use No   Comment: occ     Social History   Substance and Sexual Activity  Drug Use No    Social History   Socioeconomic History  . Marital status: Single    Spouse name: None  . Number of children: None  . Years of education: None  . Highest education level: None  Social Needs  . Financial resource strain: None  . Food insecurity - worry: None  . Food insecurity - inability: None  . Transportation needs - medical: None  . Transportation needs - non-medical: None  Occupational History  . None  Tobacco Use  . Smoking status: Current Every Day Smoker    Packs/day: 2.00    Years: 10.00    Pack years: 20.00    Types: Cigarettes  . Smokeless tobacco: Never Used  Substance and Sexual Activity  . Alcohol use: No    Comment: occ  . Drug use:  No  . Sexual activity: No  Other Topics Concern  . None  Social History Narrative  . None   Additional Social History:                         Sleep: Fair  Appetite:  Fair  Current Medications: Current Facility-Administered Medications  Medication Dose Route Frequency Provider Last Rate Last Dose  . acetaminophen (TYLENOL) tablet 650 mg  650 mg Oral Q6H PRN Ethelene Hal, NP   650 mg at 08/21/17 2155  . alum & mag hydroxide-simeth (MAALOX/MYLANTA) 200-200-20 MG/5ML suspension 30 mL  30 mL Oral Q4H PRN Ethelene Hal, NP      . diphenhydrAMINE  (BENADRYL) capsule 25 mg  25 mg Oral QHS PRN Pennelope Bracken, MD   25 mg at 08/22/17 2045  . docusate sodium (COLACE) capsule 100 mg  100 mg Oral QHS Lewis, Marcy Panning, NP      . hydrocortisone (ANUSOL-HC) 2.5 % rectal cream   Rectal TID PRN Pennelope Bracken, MD      . hydrOXYzine (ATARAX/VISTARIL) tablet 50 mg  50 mg Oral Q6H PRN Derrill Center, NP   50 mg at 08/24/17 0643  . magnesium hydroxide (MILK OF MAGNESIA) suspension 30 mL  30 mL Oral Daily PRN Ethelene Hal, NP      . paliperidone (INVEGA SUSTENNA) injection 156 mg  156 mg Intramuscular Q30 days Maris Berger T, MD      . paliperidone (INVEGA) 24 hr tablet 9 mg  9 mg Oral QHS Ethelene Hal, NP   9 mg at 08/23/17 2119  . risperiDONE (RISPERDAL M-TABS) disintegrating tablet 3 mg  3 mg Oral Q8H PRN Derrill Center, NP   3 mg at 08/23/17 1639    Lab Results: No results found for this or any previous visit (from the past 48 hour(s)).  Blood Alcohol level:  Lab Results  Component Value Date   ETH <10 08/18/2017   ETH <10 77/41/2878    Metabolic Disorder Labs: Lab Results  Component Value Date   HGBA1C 5.5 06/25/2017   MPG 111.15 06/25/2017   Lab Results  Component Value Date   PROLACTIN 42.9 (H) 08/19/2017   PROLACTIN 27.2 (H) 06/25/2017   Lab Results  Component Value Date   CHOL 198 06/25/2017   TRIG 183 (H) 06/25/2017   HDL 35 (L) 06/25/2017   CHOLHDL 5.7 06/25/2017   VLDL 37 06/25/2017   LDLCALC 126 (H) 06/25/2017    Physical Findings: AIMS: Facial and Oral Movements Muscles of Facial Expression: None, normal Lips and Perioral Area: None, normal Jaw: None, normal Tongue: None, normal,Extremity Movements Upper (arms, wrists, hands, fingers): None, normal Lower (legs, knees, ankles, toes): None, normal, Trunk Movements Neck, shoulders, hips: None, normal, Overall Severity Severity of abnormal movements (highest score from questions above): None, normal Incapacitation due  to abnormal movements: None, normal Patient's awareness of abnormal movements (rate only patient's report): No Awareness, Dental Status Current problems with teeth and/or dentures?: No Does patient usually wear dentures?: No  CIWA:    COWS:     Musculoskeletal: Strength & Muscle Tone: within normal limits Gait & Station: normal Patient leans: N/A  Psychiatric Specialty Exam: Physical Exam  Nursing note and vitals reviewed.   Review of Systems  Constitutional: Negative for fever.  Respiratory: Negative for cough.   Cardiovascular: Negative for chest pain.  Gastrointestinal: Negative for heartburn and nausea.  Psychiatric/Behavioral: Negative for depression,  hallucinations and suicidal ideas. The patient is not nervous/anxious.     Blood pressure 108/78, pulse (!) 102, temperature 98.6 F (37 C), temperature source Oral, resp. rate 18, height 6\' 1"  (1.854 m), weight 99.3 kg (219 lb), SpO2 100 %.Body mass index is 28.89 kg/m.  General Appearance: Casual and Fairly Groomed  Eye Contact:  Good  Speech:  Clear and Coherent and Normal Rate  Volume:  Normal  Mood:  Anxious and Irritable  Affect:  Blunt and Congruent  Thought Process:  Coherent and Goal Directed  Orientation:  Full (Time, Place, and Person)  Thought Content:  Paranoid Ideation  Suicidal Thoughts:  No  Homicidal Thoughts:  No  Memory:  Immediate;   Good Recent;   Good Remote;   Good  Judgement:  Fair  Insight:  Fair  Psychomotor Activity:  Normal  Concentration:  Concentration: Good  Recall:  [place] of Knowledge:  Fair  Language:  Fair  Akathisia:  No  Handed:    AIMS (if indicated):     Assets:  Communication Skills Resilience Social Support  ADL's:  Intact  Cognition:  WNL  Sleep:  Number of Hours: 6.5     Treatment Plan Summary: Daily contact with patient to assess and evaluate symptoms and progress in treatment and Medication management. Pt is demonstrating improvement of his presenting  symptoms with restarting Invega and transitioning to long-acting injectable form. We will give booster injection today with anticipation of discharge to outpatient level of care tomorrow.  - continue inpatient hospitalization  - Schizophrenia - Continue Invega 9 mg qDay - Invega Sustenna 234mg  IM given on 08/20/17; Give today Sustenna 156mg  q28 days  - Agitation - continue risperdal M-tab 3mg  q8h prn agitation  - Insomnia              - Continue benadryl 25mg  qhs prn insomnia  -anxiety - Continue atarax 25mg  q6h prn anxiety  -Encourage participation in groups and therapeutic milieu -Discharge planning will be ongoing   Pennelope Bracken, MD 08/24/2017, 1:04 PM

## 2017-08-24 NOTE — Progress Notes (Signed)
Adult Psychoeducational Group Note  Date:  08/24/2017 Time:  9:16 PM  Group Topic/Focus:  Wrap-Up Group:   The focus of this group is to help patients review their daily goal of treatment and discuss progress on daily workbooks.  Participation Level:  Minimal  Participation Quality:  Intrusive  Affect:  Flat  Cognitive:  Oriented  Insight: Limited  Engagement in Group:  Engaged  Modes of Intervention:  Socialization and Support  Additional Comments:  Patient attended and participated in group tonight. He reports that he scared of his mother and son today when they visited with him. He got upset because his mother want to tell him when she thinks he should leave.  Salley Scarlet [facility] 08/24/2017, 9:16 PM

## 2017-08-24 NOTE — Progress Notes (Signed)
Patient ID: [name], male   DOB: April 13, 1971, 46 y.o.   MRN: [id number] Pt visible in the milieu.  Interacting appropriately with staff and peers.  Pt pacing in the hall.  Pt verbalized the he hears women's voices crying.  Denied command auditory hallucinations.  Denied SI, HI and VH.  Needs assessed.  Pt denied.  Support provided.  Fifteen minute checks continue for patient safety.  Pt safe on unit.

## 2017-08-24 NOTE — Progress Notes (Signed)
Recreation Therapy Notes  Date: 08/24/17 Time: 1000 Location: 500 Hall Dayroom  Group Topic: Coping Skills  Goal Area(s) Addresses:  Patient will be able to identify positive coping skills. Patient will be able to identify benefits of using coping skills. Patient will be able to identify benefits of using coping skills post d/c.  Intervention: Mind map, pencils, white board, marker, eraser  Activity: Mind map.  Patients and LRT filled in the first eight boxes of the mind map together with depression, anxiety, hygiene, wellness, family, withdrawal, work and social interaction.  Patients were to then identify three coping skills for each of the situations identified.  The group would then reconvene and coping skills would be written on the board.   Education: Radiographer, therapeutic, Dentist.   Education Outcome: Acknowledges understanding/In group clarification offered/Needs additional education.   Clinical Observations/Feedback: Pt did not attend group.    Victorino Sparrow, LRT/CTRS         Victorino Sparrow A 08/24/2017 12:34 PM

## 2017-08-24 NOTE — Progress Notes (Signed)
Writer spoke with patient and referred to him getting upset earlier today and he reported that he was upset and punched the wall. Writer requested that he come and seek staff if feeling angry or frustrated and he agreed that he would. He appeared calm since being medicated after this incident. He attended group and sat in the dayroom briefly off and on before requesting his medications. Support given and safety maintained on unit with 15 min checks.

## 2017-08-25 MED ORDER — PALIPERIDONE ER 9 MG PO TB24: 9.0000 mg | ORAL_TABLET | Freq: Every day | ORAL | 0 refills | Status: DC

## 2017-08-25 MED ORDER — PALIPERIDONE ER 3 MG PO TB24
9.0000 mg | ORAL_TABLET | Freq: Every day | ORAL | Status: DC
  Filled 2017-08-25: qty 21

## 2017-08-25 MED ORDER — HYDROCORTISONE 2.5 % RE CREA: TOPICAL_CREAM | Freq: Three times a day (TID) | RECTAL | 0 refills | Status: DC | PRN

## 2017-08-25 MED ORDER — DIPHENHYDRAMINE HCL 25 MG PO CAPS: 25.0000 mg | ORAL_CAPSULE | Freq: Every evening | ORAL | 0 refills | Status: DC | PRN

## 2017-08-25 MED ORDER — DOCUSATE SODIUM 100 MG PO CAPS: 100.0000 mg | ORAL_CAPSULE | Freq: Every day | ORAL | 0 refills | Status: DC

## 2017-08-25 MED ORDER — HYDROXYZINE HCL 50 MG PO TABS: 50.0000 mg | ORAL_TABLET | Freq: Four times a day (QID) | ORAL | 0 refills | Status: DC | PRN

## 2017-08-25 MED ORDER — PALIPERIDONE PALMITATE 156 MG/ML IM SUSP: 156.0000 mg | INTRAMUSCULAR | 1 refills | Status: DC

## 2017-08-25 NOTE — Discharge Summary (Signed)
Physician Discharge Summary Note  Patient:  [name] is an 46 y.o., male MRN:  [id number] DOB:  1970-12-06 Patient phone:  [phone] (home)  Patient address:   [address] [place] [address],   Total Time spent with patient: Greater than 30 minutes  Date of Admission:  08/19/2017  Date of Discharge: 08-25-17  Reason for Admission: Worsening psychosis, irritability, non-adherent to treatment regimen & illicit drug use.  Principal Problem: Schizophrenia, paranoid type [facility])  Discharge Diagnoses: Patient Active Problem List   Diagnosis Date Noted  . Schizophrenia, paranoid type ([place]) [F20.0] 08/19/2017  . Hallucinations [R44.3] 07/29/2014  . Hypokalemia [E87.6] 04/28/2014  . Drug overdose [T50.901A] 04/27/2014  . Schizoaffective disorder, bipolar type ([place]) [F25.0] 03/02/2014  . Anxiety state [F41.1] 03/02/2014  . Benzodiazepine dependence ([place]) [F13.20] 03/02/2014  . Chest pain [R07.9] 10/11/2013  . EKG abnormalities [R94.31] 10/11/2013  . Chronic back pain [M54.9, G89.29]    Past Psychiatric History: Schizoaffective disorder, Bipolar-type.  Past Medical History:  Past Medical History:  Diagnosis Date  . Asthma   . CHF (congestive heart failure) ([place])   . Chronic back pain   . COPD (chronic obstructive pulmonary disease) ([place])   . Knee pain, chronic   . Migraines   . Schizophrenia, schizo-affective ([place])     Past Surgical History:  Procedure Laterality Date  . extraction of wisdom teeth    . KNEE SURGERY     four times   Family History: History reviewed. No pertinent family history.  Family Psychiatric  History: See H&P  Social History:  Social History   Substance and Sexual Activity  Alcohol Use No   Comment: occ     Social History   Substance and Sexual Activity  Drug Use No    Social History   Socioeconomic History  . Marital status: Single    Spouse name: None  . Number of children: None  . Years of education: None  .  Highest education level: None  Social Needs  . Financial resource strain: None  . Food insecurity - worry: None  . Food insecurity - inability: None  . Transportation needs - medical: None  . Transportation needs - non-medical: None  Occupational History  . None  Tobacco Use  . Smoking status: Current Every Day Smoker    Packs/day: 2.00    Years: 10.00    Pack years: 20.00    Types: Cigarettes  . Smokeless tobacco: Never Used  Substance and Sexual Activity  . Alcohol use: No    Comment: occ  . Drug use: No  . Sexual activity: No  Other Topics Concern  . None  Social History Narrative  . None   Hospital Course: [name] is a 46 y/o M with history of schizophrenia who was admitted on IVC placed by his mother due to worsening psychosis, irritability, poor medication adherence, and illicit use of alprazolam. Pt wasrestarted onInvega andtransitioned to long-acting injectable form. He has been monitored on the inpatient psychiatry unit for symptom improvement.  After his admission assessment, it was determined based on his presenting symptoms that [name] will need to be re-started on his mental health medications to re-stabilize his worsening mood symptoms. He received & was discharged on; Benadryl 25 mg for insomnia, Hydroxyzine 50 mg prn for anxiety, Invega tablets 9 mg for mood control & Invega injectable 156 mg/ML Q monthly (due 09-23-16) for mood control. He presented other significant pre-existing medical issues that required treatment. He was resumed &  also discharged on all his pertinent home medications for those health issues. He tolerated his treatment regimen without any adverse effects or reactions reported. [name] was also enrolled in the group counseling sessions being offered & held on this unit. He learned coping skills  [name] is seen today by the attending psychiatrist for discharge. He has normal anxiety about going home. He is not overwhelmed by this. He is looking forward  to working on his mental health issues. Not expressing any delusions today. No hallucination. Feels in control of himself. No suicidal thoughts. Looking forward to going home to his family. No thoughts of violence. Does not feel depressed. No evidence of mania.  The nursing staff reports that patient has been appropriate on the unit. Patient has been interacting well with peers. No behavioral issues. Patient has not voiced any suicidal thoughts. Patient has not been observed to be internally stimulated or preoccupied. Patient has been adherent with his treatment recommendations. Patient has been tolerating his medications well. No reported adverse effects or reactions.   Patient was discussed at the treatment team meeting this morning. The team members feel that patient is back to his baseline level of function. Team agrees with plan to discharge patient today to continue mental health care on an outpatient basis as noted below. He was provided with all the necessary information needed to make this appointment without problems. He left [facility] with all personal belongings in no apparent distress. Transportation per family.     Physical Findings: AIMS: Facial and Oral Movements Muscles of Facial Expression: None, normal Lips and Perioral Area: None, normal Jaw: None, normal Tongue: None, normal,Extremity Movements Upper (arms, wrists, hands, fingers): None, normal Lower (legs, knees, ankles, toes): None, normal, Trunk Movements Neck, shoulders, hips: None, normal, Overall Severity Severity of abnormal movements (highest score from questions above): None, normal Incapacitation due to abnormal movements: None, normal Patient's awareness of abnormal movements (rate only patient's report): No Awareness, Dental Status Current problems with teeth and/or dentures?: No Does patient usually wear dentures?: No  CIWA:    COWS:     Musculoskeletal: Strength & Muscle Tone: within normal limits Gait &  Station: normal Patient leans: N/A  Psychiatric Specialty Exam: Physical Exam  Constitutional: He appears well-developed.  HENT:  Head: Normocephalic.  Eyes: Pupils are equal, round, and reactive to light.  Neck: Normal range of motion.  Cardiovascular:  Elevated pulse rate  Respiratory: Effort normal.  GI: Soft.  Genitourinary:  Genitourinary Comments: Deferred  Musculoskeletal: Normal range of motion.  Neurological: He is alert.    Review of Systems  Constitutional: Negative.   HENT: Negative.   Eyes: Negative.   Respiratory: Negative.   Cardiovascular: Negative.   Gastrointestinal: Negative.   Genitourinary: Negative.   Musculoskeletal: Negative.   Skin: Negative.   Neurological: Negative.   Endo/Heme/Allergies: Negative.   Psychiatric/Behavioral: Positive for depression (Stable) and hallucinations (Hx. Psychosis). Negative for memory loss, substance abuse and suicidal ideas. The patient has insomnia (Stable). The patient is not nervous/anxious.     Blood pressure 108/78, pulse (!) 102, temperature 98.6 F (37 C), temperature source Oral, resp. rate 18, height 6\' 1"  (1.854 m), weight 99.3 kg (219 lb), SpO2 100 %.Body mass index is 28.89 kg/m.  See Md's SRA   Have you used any form of tobacco in the last 30 days? (Cigarettes, Smokeless Tobacco, Cigars, and/or Pipes): Yes  Has this patient used any form of tobacco in the last 30 days? (Cigarettes, Smokeless Tobacco, Cigars, and/or Pipes):  No  Blood Alcohol level:  Lab Results  Component Value Date   ETH <10 08/18/2017   ETH <10 07/02/8526   Metabolic Disorder Labs:  Lab Results  Component Value Date   HGBA1C 5.5 06/25/2017   MPG 111.15 06/25/2017   Lab Results  Component Value Date   PROLACTIN 42.9 (H) 08/19/2017   PROLACTIN 27.2 (H) 06/25/2017   Lab Results  Component Value Date   CHOL 198 06/25/2017   TRIG 183 (H) 06/25/2017   HDL 35 (L) 06/25/2017   CHOLHDL 5.7 06/25/2017   VLDL 37 06/25/2017    LDLCALC 126 (H) 06/25/2017   See Psychiatric Specialty Exam and Suicide Risk Assessment completed by Attending Physician prior to discharge.  Discharge destination:  Home  Is patient on multiple antipsychotic therapies at discharge:  No   Has Patient had three or more failed trials of antipsychotic monotherapy by history:  No  Recommended Plan for Multiple Antipsychotic Therapies: NA  Allergies as of 08/25/2017      Reactions   Amoxicillin Diarrhea   Has patient had a PCN reaction causing immediate rash, facial/tongue/throat swelling, SOB or lightheadedness with hypotension: No Has patient had a PCN reaction causing severe rash involving mucus membranes or skin necrosis: No Has patient had a PCN reaction that required hospitalization: No Has patient had a PCN reaction occurring within the last 10 years: No If all of the above answers are "NO", then may proceed with Cephalosporin use.   Dextromethorphan-guaifenesin Other (See Comments)   unspecified   Haloperidol Other (See Comments)   unspecified   Meloxicam Other (See Comments)   unspecified   Nsaids Other (See Comments)   Rectal bleeding   Prednisone Other (See Comments)   unspecified   Quetiapine Other (See Comments)   Psychosis with high dose (600mg )   Sudafed [pseudoephedrine Hcl] Other (See Comments)   Hallucinations   Ziprasidone Other (See Comments)   unspecified      Medication List    TAKE these medications     Indication  diphenhydrAMINE 25 mg capsule Commonly known as:  BENADRYL Take 1 capsule (25 mg total) by mouth at bedtime as needed for sleep.  Indication:  Insomnia   docusate sodium 100 MG capsule Commonly known as:  COLACE Take 1 capsule (100 mg total) by mouth at bedtime. (May purchase from over the counter): For constipation  Indication:  Constipation   hydrocortisone 2.5 % rectal cream Commonly known as:  ANUSOL-HC Place rectally 3 (three) times daily as needed for hemorrhoids or anal  itching.  Indication:  Itching of the Anus and/or Genital Area, Inflamed Hemorrhoids   hydrOXYzine 50 MG tablet Commonly known as:  ATARAX/VISTARIL Take 1 tablet (50 mg total) by mouth every 6 (six) hours as needed for anxiety.  Indication:  Feeling Anxious   paliperidone 156 MG/ML Susp injection Commonly known as:  INVEGA SUSTENNA Inject 1 mL (156 mg total) into the muscle every 30 (thirty) days. (Due on 09-23-16): For mood control Start taking on:  09/23/2017  Indication:  Mood control   paliperidone 9 MG 24 hr tablet Commonly known as:  INVEGA Take 1 tablet (9 mg total) by mouth at bedtime. For mood control  Indication:  Mood control      Follow-up [facility] Follow up on 08/28/2017.   Specialty:  [facility] information: [place] [id number] [phone]          Follow-up  recommendations: Activity:  As tolerated Diet: As recommended by your primary care doctor. Keep all scheduled follow-up appointments as recommended.    Comments: Patient is instructed prior to discharge to: Take all medications as prescribed by his/her mental healthcare provider. Report any adverse effects and or reactions from the medicines to his/her outpatient provider promptly. Patient has been instructed & cautioned: To not engage in alcohol and or illegal drug use while on prescription medicines. In the event of worsening symptoms, patient is instructed to call the crisis hotline, 911 and or go to the nearest ED for appropriate evaluation and treatment of symptoms. To follow-up with his/her primary care provider for your other medical issues, concerns and or health care needs.   Signed: Lindell Spar, NP, PMHNP, FNP-BC 08/25/2017, 10:23 AM    Patient seen, Suicide Assessment Completed.  Disposition Plan Reviewed   [name] is a 46 y/o M with history of schizophrenia who was admitted on IVC placed by his  mother due to worsening psychosis, irritability, poor medication adherence, and illicit use of alprazolam. Pt wasrestarted onInvega andtransitioned to long-acting injectable form. He has been monitored on the inpatient psychiatry unit for symptom improvement.  Today upon evaluation,pt reports he is doing well overall. He denies SI/HI/VH. He reports that [place] "are like a distant sound," and they are not bothersome to him. He is sleeping well and his appetite is good. He is tolerating the medication well without difficulty or side effects. He received his [place] 1 week booster injection yesterday and he reports that the injection went well. He is future oriented about returning home and following up with his ACT team. He is able to engage in safety planning including plan to return to [facility] or contact emergency services if he feels unable to maintain his own safety. Pt had no further questions, comments, or concerns.  Plan Of Care/Follow-up recommendations:   -Discharge to outpatient level of care  - Schizophrenia -ContinueInvega 9 mg qDay - Continue InvegaSustenna 156mg  q28days (last given 08-24-17)  -anxiety - Continue atarax 25mg  q6h prn anxiety   Activity:  as tolerated Diet:  normal Tests:  NA Other:  see above for DC  Pennelope Bracken, MD

## 2017-08-25 NOTE — Progress Notes (Signed)
Recreation Therapy Notes  INPATIENT RECREATION TR PLAN  Patient Details Name: [name]  MRN: [id number] DOB: 05/26/1971 Today's Date: 08/25/2017  Rec Therapy Plan Is patient appropriate for Therapeutic Recreation?: Yes Treatment times per week: about 3 days Estimated Length of Stay: 5-7 days TR Treatment/Interventions: Group participation (Comment)  Discharge Criteria Pt will be discharged from therapy if:: Discharged Treatment plan/goals/alternatives discussed and agreed upon by:: Patient/family  Discharge Summary Short term goals set: Pt will be able to demonstate improved communication at completion of recreation therapy sessions. Short term goals met: Complete Progress toward goals comments: Groups attended Which groups?: Wellness, Other (Comment)(Team building) Reason goals not met: None Therapeutic equipment acquired: N/A Reason patient discharged from therapy: Discharge from hospital Pt/family agrees with progress & goals achieved: Yes Date patient discharged from therapy: 08/25/17    , LRT/CTRS  ,  A 08/25/2017, 12:16 PM  

## 2017-08-25 NOTE — BHH Suicide Risk Assessment (Signed)
[facility] Discharge Suicide Risk Assessment   Principal Problem: Schizophrenia, paranoid type [facility]) Discharge Diagnoses:  Patient Active Problem List   Diagnosis Date Noted  . Schizophrenia, paranoid type ([place]) [F20.0] 08/19/2017  . Hallucinations [R44.3] 07/29/2014  . Hypokalemia [E87.6] 04/28/2014  . Drug overdose [T50.901A] 04/27/2014  . Schizoaffective disorder, bipolar type ([place]) [F25.0] 03/02/2014  . Anxiety state [F41.1] 03/02/2014  . Benzodiazepine dependence ([place]) [F13.20] 03/02/2014  . Chest pain [R07.9] 10/11/2013  . EKG abnormalities [R94.31] 10/11/2013  . Chronic back pain [M54.9, G89.29]     Total Time spent with patient: 30 minutes  Musculoskeletal: Strength & Muscle Tone: within normal limits Gait & Station: normal Patient leans: N/A  Psychiatric Specialty Exam: Review of Systems  Constitutional: Negative for chills and fever.  Cardiovascular: Negative for chest pain.  Gastrointestinal: Negative for heartburn and nausea.    Blood pressure 108/78, pulse (!) 102, temperature 98.6 F (37 C), temperature source Oral, resp. rate 18, height 6\' 1"  (1.854 m), weight 99.3 kg (219 lb), SpO2 100 %.Body mass index is 28.89 kg/m.  General Appearance: Casual and Fairly Groomed  Engineer, water::  Good  Speech:  Clear and Coherent and Normal Rate  Volume:  Normal  Mood:  Euthymic  Affect:  Appropriate, Blunt and Congruent  Thought Process:  Coherent and Goal Directed  Orientation:  Full (Time, Place, and Person)  Thought Content:  Logical and Hallucinations: Auditory  Suicidal Thoughts:  No  Homicidal Thoughts:  No  Memory:  Immediate;   Good Recent;   Good Remote;   Good  Judgement:  Fair  Insight:  Fair  Psychomotor Activity:  Normal  Concentration:  Fair  Recall:  Good  Fund of Knowledge:Good  Language: Good  Akathisia:  No  Handed:    AIMS (if indicated):     Assets:  Physical Health Resilience Social Support  Sleep:  Number of Hours: 4  Cognition: WNL   ADL's:  Intact   Mental Status Per Nursing Assessment::   On Admission:     Demographic Factors:  Male, Caucasian, Low socioeconomic status and Unemployed  Loss Factors: Financial problems/change in socioeconomic status  Historical Factors: Impulsivity  Risk Reduction Factors:   Living with another person, especially a relative, Positive social support, Positive therapeutic relationship and Positive coping skills or problem solving skills  Continued Clinical Symptoms:  Schizophrenia:   Paranoid or undifferentiated type  Cognitive Features That Contribute To Risk:  None    Suicide Risk:  Minimal: No identifiable suicidal ideation.  Patients presenting with no risk factors but with morbid ruminations; may be classified as minimal risk based on the severity of the depressive symptoms  [place], [facility] Follow up on 08/28/2017.   Specialty:  [facility] information: [place] [address] [phone]         Subjective Data:  [name] is a 46 y/o M with history of schizophrenia who was admitted on IVC placed by his mother due to worsening psychosis, irritability, poor medication adherence, and illicit use of alprazolam. Pt wasrestarted onInvega and transitioned to long-acting injectable form. He has been monitored on the inpatient psychiatry unit for symptom improvement.   Today upon evaluation, pt reports he is doing well overall. He denies SI/HI/VH. He reports that [place] "are like a distant sound," and they are not bothersome to him. He is sleeping well and his appetite is good. He is tolerating the medication well without difficulty  or side effects. He received his [place] 1 week booster injection yesterday and he reports that the injection went well. He is future oriented about returning home and following up with his ACT team. He is able to engage in safety planning including plan  to return to [facility] or contact emergency services if he feels unable to maintain his own safety. Pt had no further questions, comments, or concerns.  Plan Of Care/Follow-up recommendations:   -Discharge to outpatient level of care  - Schizophrenia -ContinueInvega 9 mg qDay - Continue InvegaSustenna 156mg  q28 days (last given 08-24-17)  -anxiety - Continue atarax 25mg  q6h prn anxiety   Activity:  as tolerated Diet:  normal Tests:  NA Other:  see above for [place], MD 08/25/2017, 9:19 AM

## 2017-08-25 NOTE — Progress Notes (Signed)
Pt has been restless for majority of shift. Pt is seen pacing hall and talking to self. Pt had to be redirected by staff. Pt has been preoccupied and somatically focused this evening. Currently, no behavior problems noted. Will monitor.

## 2017-08-25 NOTE — Plan of Care (Deleted)
Pt showed improved communication at completion of wellness and team building recreation therapy sessions.   Victorino Sparrow, LRT/CTRS

## 2017-08-25 NOTE — Progress Notes (Signed)
Recreation Therapy Notes  Date: 08/25/17 Time: 0950 Location:  500 Hall Dayroom   Group Topic: Self-Esteem  Goal Area(s) Addresses:  Patient will successfully identify positive attributes about themselves.  Patient will successfully identify benefit of improved self-esteem.   Intervention: Magazines, scissors, glue sticks, Architect paper  Activity: Collage.  Patients were to create a collage that highlighted things about them such as what they like, new things they would like to try, things that mean something to them, etc.  Education:  Self-Esteem, Discharge Planning.   Education Outcome: Acknowledges education/In group clarification offered/Needs additional education  Clinical Observations/Feedback: Pt did not attend group.    Victorino Sparrow, LRT/CTRS     Victorino Sparrow A 08/25/2017 12:07 PM

## 2017-08-25 NOTE — Plan of Care (Signed)
Pt was able to show improved communication at completion of wellness and team building recreation therapy sessions.   [name], LRT/CTRS

## 2017-08-25 NOTE — Progress Notes (Signed)
Data. Patient denies SI/HI. Continues to have "Voices". Verbally contracts for safety on the unit and to come to staff before acting of any self harm thoughts/feelings/voices.  Patient mostly isolating to his room. He does come out to have needs met, or to go to meals. Conversation is garbled at times, but he can make his needs known. Action. Emotional support and encouragement offered. Education provided on medication, indications and side effect. Q 15 minute checks done for safety. Response. Safety on the unit maintained through 15 minute checks.  Medications taken as prescribed. Pt. discharged to lobby.  Belongings sheet reviewed and signed by pt. and all belongings sent home. Paperwork reviewed and pt. able to verbalize understanding of education. Pt. in no current distress and ambulatory.

## 2017-09-19 ENCOUNTER — Encounter (HOSPITAL_COMMUNITY): Payer: Self-pay | Admitting: Emergency Medicine

## 2017-09-19 ENCOUNTER — Other Ambulatory Visit: Payer: Self-pay

## 2017-09-19 ENCOUNTER — Ambulatory Visit (HOSPITAL_COMMUNITY)
Admission: EM | Admit: 2017-09-19 | Discharge: 2017-09-19 | Disposition: A | Discharge status: Home / Self Care | Payer: Medicare Other | Attending: Family Medicine | Admitting: Family Medicine

## 2017-09-19 DIAGNOSIS — J40 Bronchitis, not specified as acute or chronic: Secondary | ICD-10-CM | POA: Diagnosis not present

## 2017-09-19 MED ORDER — AZITHROMYCIN 250 MG PO TABS: 250.0000 mg | ORAL_TABLET | Freq: Every day | ORAL | 1 refills | Status: DC

## 2017-09-19 NOTE — ED Triage Notes (Signed)
Patient has been coughing up thick, yellow phlegm.  Patient says he has pneumonia.  Patient immediately says he has had 2 hydrocodone 7.5 prior to arrival.  [name] symptoms for a month

## 2017-09-19 NOTE — ED Provider Notes (Signed)
[place]   [id number] 09/19/17 Arrival Time: 2637   SUBJECTIVE:  [name] is a 47 y.o. male who presents to the urgent care with complaint of coughing up thick, yellow phlegm.  Patient says he has pneumonia.  Patient immediately says he has had 2 hydrocodone 7.5 prior to arrival.  [name] symptoms for a month  Past Medical History:  Diagnosis Date  . Asthma   . CHF (congestive heart failure) ([place])   . Chronic back pain   . COPD (chronic obstructive pulmonary disease) ([place])   . Knee pain, chronic   . Migraines   . Schizophrenia, schizo-affective ([place])    Family History  Problem Relation Age of Onset  . Hypertension Father    Social History   Socioeconomic History  . Marital status: Single    Spouse name: Not on file  . Number of children: Not on file  . Years of education: Not on file  . Highest education level: Not on file  Social Needs  . Financial resource strain: Not on file  . Food insecurity - worry: Not on file  . Food insecurity - inability: Not on file  . Transportation needs - medical: Not on file  . Transportation needs - non-medical: Not on file  Occupational History  . Not on file  Tobacco Use  . Smoking status: Current Every Day Smoker    Packs/day: 2.00    Years: 10.00    Pack years: 20.00    Types: Cigarettes  . Smokeless tobacco: Never Used  Substance and Sexual Activity  . Alcohol use: No    Comment: occ  . Drug use: No  . Sexual activity: No  Other Topics Concern  . Not on file  Social History Narrative  . Not on file   Current Meds  Medication Sig  . ACETAMINOPHEN-CODEINE #3 PO Take by mouth.  . ALPRAZolam (XANAX) 0.5 MG tablet Take 0.5 mg by mouth at bedtime as needed for anxiety (4mg  a day total).  Marland Kitchen HYDROcodone-acetaminophen (NORCO) 7.5-325 MG tablet Take 1 tablet by mouth every 6 (six) hours as needed for moderate pain.  . hydrOXYzine (ATARAX/VISTARIL) 50 MG tablet Take 1 tablet (50 mg total) by mouth every 6 (six)  hours as needed for anxiety.  . risperiDONE (RISPERDAL) 3 MG tablet Take 3 mg by mouth at bedtime.   Allergies  Allergen Reactions  . Amoxicillin Diarrhea    Has patient had a PCN reaction causing immediate rash, facial/tongue/throat swelling, SOB or lightheadedness with hypotension: No Has patient had a PCN reaction causing severe rash involving mucus membranes or skin necrosis: No Has patient had a PCN reaction that required hospitalization: No Has patient had a PCN reaction occurring within the last 10 years: No If all of the above answers are "NO", then may proceed with Cephalosporin use.   Marland Kitchen Dextromethorphan-Guaifenesin Other (See Comments)    unspecified  . Haloperidol Other (See Comments)    unspecified  . Meloxicam Other (See Comments)    unspecified  . Nsaids Other (See Comments)    Rectal bleeding  . Prednisone Other (See Comments)    unspecified  . Quetiapine Other (See Comments)    Psychosis with high dose (600mg )  . Sudafed [Pseudoephedrine Hcl] Other (See Comments)    Hallucinations  . Ziprasidone Other (See Comments)    unspecified      ROS: As per HPI, remainder of ROS negative.   OBJECTIVE:   Vitals:   09/19/17 1530  BP: 106/79  Pulse: (!) 110  Resp: 18  Temp: 97.7 F (36.5 C)  TempSrc: Oral  SpO2: 99%     General appearance: alert; no distress Eyes: PERRL; EOMI; conjunctiva normal HENT: normocephalic; atraumatic; TMs normal, canal normal, external ears normal without trauma; nasal mucosa normal; oral mucosa normal Neck: supple Lungs: clear to auscultation bilaterally Heart: regular rate and rhythm Back: no CVA tenderness Extremities: no cyanosis or edema; symmetrical with no gross deformities Skin: warm and dry Neurologic: normal gait; grossly normal Psychological: alert and cooperative; normal mood and affect      Labs:  Results for orders placed or performed during the hospital encounter of 08/18/17  Comprehensive metabolic panel   Result Value Ref Range   Sodium 144 135 - 145 mmol/L   Potassium 3.2 (L) 3.5 - 5.1 mmol/L   Chloride 107 101 - 111 mmol/L   CO2 28 22 - 32 mmol/L   Glucose, Bld 98 65 - 99 mg/dL   BUN 5 (L) 6 - 20 mg/dL   Creatinine, Ser 0.81 0.61 - 1.24 mg/dL   Calcium 9.4 8.9 - 10.3 mg/dL   Total Protein 7.4 6.5 - 8.1 g/dL   Albumin 4.1 3.5 - 5.0 g/dL   AST 27 15 - 41 U/L   ALT 27 17 - 63 U/L   Alkaline Phosphatase 57 38 - 126 U/L   Total Bilirubin 1.0 0.3 - 1.2 mg/dL   GFR calc non Af Amer >60 >60 mL/min   GFR calc Af Amer >60 >60 mL/min   Anion gap 9 5 - 15  Ethanol  Result Value Ref Range   Alcohol, Ethyl (B) <10 <10 mg/dL  Urine rapid drug screen (hosp performed)  Result Value Ref Range   Opiates NONE DETECTED NONE DETECTED   Cocaine NONE DETECTED NONE DETECTED   Benzodiazepines POSITIVE (A) NONE DETECTED   Amphetamines NONE DETECTED NONE DETECTED   Tetrahydrocannabinol NONE DETECTED NONE DETECTED   Barbiturates NONE DETECTED NONE DETECTED  CBC with Diff  Result Value Ref Range   WBC 8.8 4.0 - 10.5 K/uL   RBC 5.35 4.22 - 5.81 MIL/uL   Hemoglobin 15.9 13.0 - 17.0 g/dL   HCT 47.3 39.0 - 52.0 %   MCV 88.4 78.0 - 100.0 fL   MCH 29.7 26.0 - 34.0 pg   MCHC 33.6 30.0 - 36.0 g/dL   RDW 13.1 11.5 - 15.5 %   Platelets 216 150 - 400 K/uL   Neutrophils Relative % 58 %   Neutro Abs 5.2 1.7 - 7.7 K/uL   Lymphocytes Relative 29 %   Lymphs Abs 2.5 0.7 - 4.0 K/uL   Monocytes Relative 10 %   Monocytes Absolute 0.8 0.1 - 1.0 K/uL   Eosinophils Relative 2 %   Eosinophils Absolute 0.2 0.0 - 0.7 K/uL   Basophils Relative 1 %   Basophils Absolute 0.1 0.0 - 0.1 K/uL  Prolactin  Result Value Ref Range   Prolactin 42.9 (H) 4.0 - 15.2 ng/mL    Labs Reviewed - No data to display  No results found.     ASSESSMENT & PLAN:  1. Bronchitis     Meds ordered this encounter  Medications  . azithromycin (ZITHROMAX) 250 MG tablet    Sig: Take 1 tablet (250 mg total) by mouth daily. Take first  2 tablets together, then 1 every day until finished.    Dispense:  6 tablet    Refill:  1    Reviewed expectations re: course of current medical issues.  Questions answered. Outlined signs and symptoms indicating need for more acute intervention. Patient verbalized understanding. After Visit Summary given.      Robyn Haber, MD 09/19/17 1558

## 2017-10-29 ENCOUNTER — Ambulatory Visit (HOSPITAL_COMMUNITY)
Admission: EM | Admit: 2017-10-29 | Discharge: 2017-10-29 | Disposition: A | Discharge status: Home / Self Care | Payer: Medicare Other | Attending: Family Medicine | Admitting: Family Medicine

## 2017-10-29 ENCOUNTER — Encounter (HOSPITAL_COMMUNITY): Payer: Self-pay | Admitting: Emergency Medicine

## 2017-10-29 DIAGNOSIS — J40 Bronchitis, not specified as acute or chronic: Secondary | ICD-10-CM

## 2017-10-29 MED ORDER — AZITHROMYCIN 250 MG PO TABS: 250.0000 mg | ORAL_TABLET | Freq: Every day | ORAL | 0 refills | Status: DC

## 2017-10-29 NOTE — ED Provider Notes (Signed)
[place]    CSN: [id number] Arrival date & time: 10/29/17  1108     History   Chief Complaint Chief Complaint  Patient presents with  . URI    HPI [name] is a 47 y.o. male.   47 year old male with history of asthma, CHF, chronic back pain, COPD, schizoaffective disorder, benzodiazepine dependence, comes in for 1.5-week history of URI symptoms.  Productive cough, body aches, nausea, headache. Generalized headache. Congestion that has since resolved. States he took 4 mg of Xanax and 2 7.5 mg of Vicodin today. Denies fever, chills, night sweats.  Denies chest pain, shortness of breath, wheezing, orthopnea, leg swelling. Current every day smoker, 2-3 ppd, 32 years.       Past Medical History:  Diagnosis Date  . Asthma   . CHF (congestive heart failure) ([place])   . Chronic back pain   . COPD (chronic obstructive pulmonary disease) ([place])   . Knee pain, chronic   . Migraines   . Schizophrenia, schizo-affective [facility])     Patient Active Problem List   Diagnosis Date Noted  . Schizophrenia, paranoid type ([place]) 08/19/2017  . Hallucinations 07/29/2014  . Hypokalemia 04/28/2014  . Drug overdose 04/27/2014  . Schizoaffective disorder, bipolar type ([place]) 03/02/2014  . Anxiety state 03/02/2014  . Benzodiazepine dependence ([place]) 03/02/2014  . Chest pain 10/11/2013  . EKG abnormalities 10/11/2013  . Chronic back pain     Past Surgical History:  Procedure Laterality Date  . extraction of wisdom teeth    . KNEE SURGERY     four times       Home Medications    Prior to Admission medications   Medication Sig Start Date End Date Taking? Authorizing Provider  ALPRAZolam [name]) 0.5 MG tablet Take 1 mg by mouth QID.    Yes [provider]  HYDROcodone-acetaminophen (NORCO) 7.5-325 MG tablet Take 1 tablet by mouth every 8 (eight) hours as needed for moderate pain.    Yes [provider]  risperiDONE (RISPERDAL) 3 MG tablet Take 3 mg by mouth  at bedtime.   Yes [provider]  tiZANidine (ZANAFLEX) 4 MG capsule Take 4 mg by mouth 3 (three) times daily as needed for muscle spasms.   Yes [provider]  azithromycin (ZITHROMAX) 250 MG tablet Take 1 tablet (250 mg total) by mouth daily. Take first 2 tablets together, then 1 every day until finished. 10/29/17   Tasia Catchings, Hattie Pine V, PA-C  diphenhydrAMINE (BENADRYL) 25 mg capsule Take 1 capsule (25 mg total) by mouth at bedtime as needed for sleep. 08/25/17   Lindell Spar I, NP  docusate sodium (COLACE) 100 MG capsule Take 1 capsule (100 mg total) by mouth at bedtime. (May purchase from over the counter): For constipation 08/25/17   Lindell Spar I, NP  hydrocortisone (ANUSOL-HC) 2.5 % rectal cream Place rectally 3 (three) times daily as needed for hemorrhoids or anal itching. 08/25/17   Lindell Spar I, NP  hydrOXYzine (ATARAX/VISTARIL) 50 MG tablet Take 1 tablet (50 mg total) by mouth every 6 (six) hours as needed for anxiety. 08/25/17   Encarnacion Slates, NP    Family History Family History  Problem Relation Age of Onset  . Hypertension Father     Social History Social History   Tobacco Use  . Smoking status: Current Every Day Smoker    Packs/day: 2.00    Years: 10.00    Pack years: 20.00    Types: Cigarettes  . Smokeless tobacco:  Never Used  Substance Use Topics  . Alcohol use: No    Comment: occ  . Drug use: No     Allergies   Amoxicillin; Dextromethorphan-guaifenesin; Haloperidol; Meloxicam; Nsaids; Prednisone; Quetiapine; Sudafed [pseudoephedrine hcl]; and Ziprasidone   Review of Systems Review of Systems  Reason unable to perform ROS: See HPI as above.     Physical Exam Triage Vital Signs ED Triage Vitals  Enc Vitals Group     BP 10/29/17 1123 108/71     Pulse Rate 10/29/17 1123 65     Resp 10/29/17 1123 16     Temp 10/29/17 1123 97.7 F (36.5 C)     Temp Source 10/29/17 1123 Oral     SpO2 10/29/17 1123 98 %     Weight 10/29/17 1122 200 lb (90.7  kg)     Height --      Head Circumference --      Peak Flow --      Pain Score --      Pain Loc --      Pain Edu? --      Excl. in [place]? --    No data found.  Updated Vital Signs BP 108/71   Pulse 65   Temp 97.7 F (36.5 C) (Oral)   Resp 16   Wt 200 lb (90.7 kg)   SpO2 98%   BMI 26.39 kg/m   Physical Exam  Constitutional: He is oriented to person, place, and time. He appears well-developed and well-nourished. No distress.  HENT:  Head: Normocephalic and atraumatic.  Right Ear: External ear and ear canal normal. Tympanic membrane is erythematous. Tympanic membrane is not bulging.  Left Ear: External ear and ear canal normal. Tympanic membrane is erythematous. Tympanic membrane is not bulging.  Nose: Nose normal. Right sinus exhibits no maxillary sinus tenderness and no frontal sinus tenderness. Left sinus exhibits no maxillary sinus tenderness and no frontal sinus tenderness.  Mouth/Throat: Uvula is midline, oropharynx is clear and moist and mucous membranes are normal.  Eyes: Conjunctivae are normal. Pupils are equal, round, and reactive to light.  Neck: Normal range of motion. Neck supple.  Cardiovascular: Normal rate, regular rhythm and normal heart sounds. Exam reveals no gallop and no friction rub.  No murmur heard. Pulmonary/Chest: Effort normal and breath sounds normal. No accessory muscle usage or stridor. No respiratory distress. He has no decreased breath sounds. He has no wheezes. He has no rhonchi. He has no rales.  Musculoskeletal:  No pitting edema  Lymphadenopathy:    He has no cervical adenopathy.  Neurological: He is alert and oriented to person, place, and time. Coordination and gait normal.  Skin: Skin is warm and dry.  Psychiatric: He has a normal mood and affect. His behavior is normal. Judgment normal.    UC Treatments / Results  Labs (all labs ordered are listed, but only abnormal results are displayed) Labs Reviewed - No data to display  EKG  EKG  Interpretation None       Radiology No results found.  Procedures Procedures (including critical care time)  Medications Ordered in UC Medications - No data to display   Initial Impression / Assessment and Plan / UC Course  I have reviewed the triage vital signs and the nursing notes.  Pertinent labs & imaging results that were available during my care of the patient were reviewed by me and considered in my medical decision making (see chart for details).    Patient requesting azithromycin as medicine  for COPD exacerbation. States it works well for him. He recently was on azithromycin without problems, and has not had it within the last month. Possible QT elongation with risperidone, discussed risks and benefits, patient would like to proceed as he has been on it without problems. Will have patient follow up with PCP for reevaluation and COPD management.   Final Clinical Impressions(s) / UC Diagnoses   Final diagnoses:  Bronchitis    ED Discharge Orders        Ordered    azithromycin (ZITHROMAX) 250 MG tablet  Daily     10/29/17 1207        Ok Edwards, PA-C 10/29/17 1217

## 2017-10-29 NOTE — Discharge Instructions (Signed)
Start azithromycin as directed for bronchitis. Please follow up with your PCP for further evaluation and management of bronchitis. If experiencing worsening symptoms, chest pain, shortness of breath, wheezing, seizures, palpitations, passing out, dizziness, weakness, go to the emergency department for further evaluation.

## 2017-10-29 NOTE — ED Triage Notes (Signed)
PT reports cough, congestion (resolved)  Bodyaches, nausea, headache for 1.5 weeks  PT takes 4mg  xanax per day, he has already had 2 7.5 mg vicodin tablets today.

## 2017-12-11 ENCOUNTER — Other Ambulatory Visit: Payer: Self-pay

## 2017-12-11 ENCOUNTER — Encounter (HOSPITAL_COMMUNITY): Payer: Self-pay | Admitting: *Deleted

## 2017-12-11 ENCOUNTER — Emergency Department (HOSPITAL_COMMUNITY)
Admission: EM | Admit: 2017-12-11 | Discharge: 2017-12-11 | Disposition: A | Discharge status: Home / Self Care | Payer: Medicare Other | Attending: Emergency Medicine | Admitting: Emergency Medicine

## 2017-12-11 DIAGNOSIS — M545 Low back pain: Secondary | ICD-10-CM | POA: Diagnosis present

## 2017-12-11 DIAGNOSIS — Y998 Other external cause status: Secondary | ICD-10-CM | POA: Insufficient documentation

## 2017-12-11 DIAGNOSIS — Y929 Unspecified place or not applicable: Secondary | ICD-10-CM | POA: Diagnosis not present

## 2017-12-11 DIAGNOSIS — Z5321 Procedure and treatment not carried out due to patient leaving prior to being seen by health care provider: Secondary | ICD-10-CM | POA: Insufficient documentation

## 2017-12-11 DIAGNOSIS — Y939 Activity, unspecified: Secondary | ICD-10-CM | POA: Diagnosis not present

## 2017-12-11 DIAGNOSIS — W19XXXA Unspecified fall, initial encounter: Secondary | ICD-10-CM | POA: Diagnosis not present

## 2017-12-11 NOTE — ED Notes (Signed)
Pt very sleepy in triage

## 2017-12-11 NOTE — ED Triage Notes (Signed)
The pt is c/o lower back pain  Since he fell this am at 0300am. Very sleepy  He reports that his back pain has decreased since he arrived

## 2017-12-11 NOTE — ED Notes (Signed)
Pt called twice no answer

## 2017-12-11 NOTE — ED Notes (Signed)
Pt states we are not doing nothing for him and leaving going to [facility]. Pt  Left AMA

## 2017-12-11 NOTE — ED Notes (Signed)
Patient is A&Ox4.  No signs of distress noted.  Please see providers complete history and physical exam.  

## 2017-12-15 ENCOUNTER — Emergency Department (HOSPITAL_BASED_OUTPATIENT_CLINIC_OR_DEPARTMENT_OTHER)
Admission: EM | Admit: 2017-12-15 | Discharge: 2017-12-16 | Disposition: A | Discharge status: Home / Self Care | Payer: Medicare Other | Attending: Emergency Medicine | Admitting: Emergency Medicine

## 2017-12-15 ENCOUNTER — Other Ambulatory Visit: Payer: Self-pay

## 2017-12-15 ENCOUNTER — Encounter (HOSPITAL_BASED_OUTPATIENT_CLINIC_OR_DEPARTMENT_OTHER): Payer: Self-pay | Admitting: *Deleted

## 2017-12-15 DIAGNOSIS — I509 Heart failure, unspecified: Secondary | ICD-10-CM | POA: Diagnosis not present

## 2017-12-15 DIAGNOSIS — G8929 Other chronic pain: Secondary | ICD-10-CM

## 2017-12-15 DIAGNOSIS — F17210 Nicotine dependence, cigarettes, uncomplicated: Secondary | ICD-10-CM | POA: Insufficient documentation

## 2017-12-15 DIAGNOSIS — J449 Chronic obstructive pulmonary disease, unspecified: Secondary | ICD-10-CM | POA: Diagnosis not present

## 2017-12-15 DIAGNOSIS — M545 Low back pain: Secondary | ICD-10-CM | POA: Diagnosis not present

## 2017-12-15 DIAGNOSIS — R519 Headache, unspecified: Secondary | ICD-10-CM

## 2017-12-15 DIAGNOSIS — R51 Headache: Secondary | ICD-10-CM | POA: Diagnosis present

## 2017-12-15 DIAGNOSIS — Z79899 Other long term (current) drug therapy: Secondary | ICD-10-CM | POA: Insufficient documentation

## 2017-12-15 NOTE — ED Triage Notes (Signed)
Headache after dinner tonight. He drove himself here. Very sleepy. States he went to [facility] 4 days ago and left before being seen.

## 2017-12-15 NOTE — ED Notes (Signed)
Pt states his head doesn't hurt anymore, but his back is hurting now. Pt states he wants an MRI done.

## 2017-12-16 NOTE — ED Provider Notes (Signed)
[facility] EMERGENCY DEPARTMENT Provider Note   CSN: [id number] Arrival date & time: 12/15/17  2157     History   Chief Complaint Chief Complaint  Patient presents with  . Headache    HPI [name] is a 47 y.o. male.  47 year old male with history of schizophrenia and chronic pain here with transient headache and back pain.  The patient states that earlier today, he had a mild, generalized headache as well as lower back pain.  These are chronic issues for him.  He states that he was coming here with his roommate, so he thought he check in to get seen.  His pain has since resolved.  He states he does want a "full body scan" to make sure that nothing is wrong with him but on further questioning, is unable to provide any specific complaints.  He states is at his baseline state of health.  He does state he has schizophrenia and feels like people are out to get him, but this has been chronic and well-documented.  Denies any HI, SI, or auditory visual hallucinations.  Denies any plain films or other images.  Denies any lower extremity weakness or numbness.  No other complaints.  Past Medical History:  Diagnosis Date  . Asthma   . CHF (congestive heart failure) ([place])   . Chronic back pain   . COPD (chronic obstructive pulmonary disease) ([place])   . Knee pain, chronic   . Migraines   . Schizophrenia, schizo-affective [facility])     Patient Active Problem List   Diagnosis Date Noted  . Schizophrenia, paranoid type ([place]) 08/19/2017  . Hallucinations 07/29/2014  . Hypokalemia 04/28/2014  . Drug overdose 04/27/2014  . Schizoaffective disorder, bipolar type ([place]) 03/02/2014  . Anxiety state 03/02/2014  . Benzodiazepine dependence ([place]) 03/02/2014  . Chest pain 10/11/2013  . EKG abnormalities 10/11/2013  . Chronic back pain     Past Surgical History:  Procedure Laterality Date  . extraction of wisdom teeth    . KNEE SURGERY     four times        Home Medications     Prior to Admission medications   Medication Sig Start Date End Date Taking? Authorizing Provider  ALPRAZolam [name]) 0.5 MG tablet Take 1 mg by mouth QID.    Yes [provider]  HYDROcodone-acetaminophen (NORCO) 7.5-325 MG tablet Take 1 tablet by mouth every 8 (eight) hours as needed for moderate pain.    Yes [provider]  azithromycin (ZITHROMAX) 250 MG tablet Take 1 tablet (250 mg total) by mouth daily. Take first 2 tablets together, then 1 every day until finished. 10/29/17   Tasia Catchings, Amy V, PA-C  diphenhydrAMINE (BENADRYL) 25 mg capsule Take 1 capsule (25 mg total) by mouth at bedtime as needed for sleep. 08/25/17   Lindell Spar I, NP  docusate sodium (COLACE) 100 MG capsule Take 1 capsule (100 mg total) by mouth at bedtime. (May purchase from over the counter): For constipation 08/25/17   Lindell Spar I, NP  hydrocortisone (ANUSOL-HC) 2.5 % rectal cream Place rectally 3 (three) times daily as needed for hemorrhoids or anal itching. 08/25/17   Lindell Spar I, NP  hydrOXYzine (ATARAX/VISTARIL) 50 MG tablet Take 1 tablet (50 mg total) by mouth every 6 (six) hours as needed for anxiety. 08/25/17   Lindell Spar I, NP  risperiDONE (RISPERDAL) 3 MG tablet Take 3 mg by mouth at bedtime.    [provider]  tiZANidine (ZANAFLEX) 4 MG  capsule Take 4 mg by mouth 3 (three) times daily as needed for muscle spasms.    [provider]    Family History Family History  Problem Relation Age of Onset  . Hypertension Father     Social History Social History   Tobacco Use  . Smoking status: Current Every Day Smoker    Packs/day: 2.00    Years: 10.00    Pack years: 20.00    Types: Cigarettes  . Smokeless tobacco: Never Used  Substance Use Topics  . Alcohol use: No    Comment: occ  . Drug use: No     Allergies   Amoxicillin; Dextromethorphan-guaifenesin; Haloperidol; Meloxicam; Nsaids; Prednisone; Quetiapine; Sudafed [pseudoephedrine hcl]; and  Ziprasidone   Review of Systems Review of Systems  Constitutional: Negative for chills, fatigue and fever.  HENT: Negative for congestion and rhinorrhea.   Eyes: Negative for visual disturbance.  Respiratory: Negative for cough, shortness of breath and wheezing.   Cardiovascular: Negative for chest pain and leg swelling.  Gastrointestinal: Negative for abdominal pain, diarrhea, nausea and vomiting.  Genitourinary: Negative for dysuria and flank pain.  Musculoskeletal: Negative for neck pain and neck stiffness.  Skin: Negative for rash and wound.  Allergic/Immunologic: Negative for immunocompromised state.  Neurological: Negative for syncope, weakness and headaches.  All other systems reviewed and are negative.    Physical Exam Updated Vital Signs BP (!) 147/93 (BP Location: Right Arm)   Pulse 65   Temp 98.2 F (36.8 C) (Oral)   Resp 18   Ht 6\' 1"  (1.854 m)   Wt 102.1 kg (225 lb)   SpO2 98%   BMI 29.69 kg/m   Physical Exam  Constitutional: He is oriented to person, place, and time. He appears well-developed and well-nourished. No distress.  HENT:  Head: Normocephalic and atraumatic.  Eyes: Conjunctivae are normal.  Neck: Neck supple.  Cardiovascular: Normal rate, regular rhythm and normal heart sounds. Exam reveals no friction rub.  No murmur heard. Pulmonary/Chest: Effort normal and breath sounds normal. No respiratory distress. He has no wheezes. He has no rales.  Abdominal: He exhibits no distension.  Musculoskeletal: He exhibits no edema.  Neurological: He is alert and oriented to person, place, and time. He exhibits normal muscle tone.  Skin: Skin is warm. Capillary refill takes less than 2 seconds.  Psychiatric: He has a normal mood and affect. His speech is slurred. Thought content is paranoid. Thought content is not delusional. He expresses no homicidal and no suicidal ideation. He expresses no suicidal plans and no homicidal plans.  Nursing note and vitals  reviewed.    ED Treatments / Results  Labs (all labs ordered are listed, but only abnormal results are displayed) Labs Reviewed - No data to display  EKG None  Radiology No results found.  Procedures Procedures (including critical care time)  Medications Ordered in ED Medications - No data to display   Initial Impression / Assessment and Plan / ED Course  I have reviewed the triage vital signs and the nursing notes.  Pertinent labs & imaging results that were available during my care of the patient were reviewed by me and considered in my medical decision making (see chart for details).     47 year old male with past medical history as above here with transient headache and back pain, now resolved.  On exam, he has absolutely no midline neck or back tenderness.  His neurological exam is nonfocal.  He does appear to have slightly slurred speech and  admits to Xanax and other drug use.  Denies any fevers or chills.  Denies any lower extremity or upper extremity numbness or weakness.  He is otherwise without complaints.  Regarding his schizophrenia, he has some baseline paranoia that does not appear acutely changed.  He has no homicidal suicidal ideations.  He is not actively responding to internal stimuli.  He declines psychiatric evaluation at this time and he does not meet IVC criteria.  He states he is mainly here to just be "checked out."  Given absence of any other complaints with stable vital signs and well appearance, no apparent emergent indication for further imaging or labs.  Will discharge home.  Final Clinical Impressions(s) / ED Diagnoses   Final diagnoses:  Acute nonintractable headache, unspecified headache type  Chronic midline low back pain without sciatica    ED Discharge Orders    None       Duffy Bruce, MD 12/16/17 [phone]

## 2018-01-01 ENCOUNTER — Ambulatory Visit: Payer: Medicare Other | Admitting: Psychology

## 2018-01-11 ENCOUNTER — Emergency Department (HOSPITAL_COMMUNITY)
Admission: EM | Admit: 2018-01-11 | Discharge: 2018-01-12 | Payer: Medicare Other | Attending: Emergency Medicine | Admitting: Emergency Medicine

## 2018-01-11 ENCOUNTER — Encounter (HOSPITAL_COMMUNITY): Payer: Self-pay

## 2018-01-11 DIAGNOSIS — J449 Chronic obstructive pulmonary disease, unspecified: Secondary | ICD-10-CM | POA: Diagnosis not present

## 2018-01-11 DIAGNOSIS — I509 Heart failure, unspecified: Secondary | ICD-10-CM | POA: Insufficient documentation

## 2018-01-11 DIAGNOSIS — R4689 Other symptoms and signs involving appearance and behavior: Secondary | ICD-10-CM

## 2018-01-11 DIAGNOSIS — F1910 Other psychoactive substance abuse, uncomplicated: Secondary | ICD-10-CM | POA: Diagnosis not present

## 2018-01-11 DIAGNOSIS — Z79899 Other long term (current) drug therapy: Secondary | ICD-10-CM | POA: Diagnosis not present

## 2018-01-11 DIAGNOSIS — F259 Schizoaffective disorder, unspecified: Secondary | ICD-10-CM | POA: Diagnosis not present

## 2018-01-11 DIAGNOSIS — E876 Hypokalemia: Secondary | ICD-10-CM | POA: Insufficient documentation

## 2018-01-11 DIAGNOSIS — Z046 Encounter for general psychiatric examination, requested by authority: Secondary | ICD-10-CM | POA: Diagnosis present

## 2018-01-11 LAB — COMPREHENSIVE METABOLIC PANEL
ALT: 21 U/L (ref 17–63)
ANION GAP: 10 (ref 5–15)
AST: 23 U/L (ref 15–41)
Albumin: 4.4 g/dL (ref 3.5–5.0)
Alkaline Phosphatase: 54 U/L (ref 38–126)
BUN: <5 mg/dL — ABNORMAL LOW (ref 6–20)
CALCIUM: 9.7 mg/dL (ref 8.9–10.3)
CHLORIDE: 108 mmol/L (ref 101–111)
CO2: 24 mmol/L (ref 22–32)
CREATININE: 0.82 mg/dL (ref 0.61–1.24)
GFR calc non Af Amer: >60 mL/min (ref >60)
Glucose, Bld: 120 mg/dL — ABNORMAL HIGH (ref 65–99)
Potassium: 3.1 mmol/L — ABNORMAL LOW (ref 3.5–5.1)
SODIUM: 142 mmol/L (ref 135–145)
Total Bilirubin: 0.6 mg/dL (ref 0.3–1.2)
Total Protein: 7.6 g/dL (ref 6.5–8.1)

## 2018-01-11 LAB — CBC
HCT: 47.3 % (ref 39.0–52.0)
Hemoglobin: 15.8 g/dL (ref 13.0–17.0)
MCH: 29.5 pg (ref 26.0–34.0)
MCHC: 33.4 g/dL (ref 30.0–36.0)
MCV: 88.2 fL (ref 78.0–100.0)
Platelets: 218 10*3/uL (ref 150–400)
RBC: 5.36 MIL/uL (ref 4.22–5.81)
RDW: 12.9 % (ref 11.5–15.5)
WBC: 8.5 10*3/uL (ref 4.0–10.5)

## 2018-01-11 LAB — ETHANOL
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
## 2018-01-11 NOTE — ED Notes (Signed)
Bed: WA28 Expected date:  Expected time:  Means of arrival:  Comments: 
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
## 2018-01-12 DIAGNOSIS — R4689 Other symptoms and signs involving appearance and behavior: Secondary | ICD-10-CM | POA: Diagnosis not present

## 2018-01-12 LAB — RAPID URINE DRUG SCREEN, HOSP PERFORMED
Amphetamines: NOT DETECTED
Barbiturates: NOT DETECTED
Benzodiazepines: POSITIVE — AB
COCAINE: NOT DETECTED
OPIATES: POSITIVE — AB
Tetrahydrocannabinol: NOT DETECTED

## 2018-01-12 MED ORDER — HYDROXYZINE HCL 25 MG PO TABS: 50.0000 mg | ORAL_TABLET | Freq: Three times a day (TID) | ORAL | Status: DC | PRN

## 2018-01-12 MED ORDER — PALIPERIDONE PALMITATE ER 156 MG/ML IM SUSY
156.0000 mg | PREFILLED_SYRINGE | Freq: Once | INTRAMUSCULAR | Status: AC
  Administered 2018-01-12: 156 mg via INTRAMUSCULAR
  Filled 2018-01-12: qty 1

## 2018-01-12 MED ORDER — POTASSIUM CHLORIDE CRYS ER 20 MEQ PO TBCR
40.0000 meq | EXTENDED_RELEASE_TABLET | Freq: Once | ORAL | Status: DC
  Filled 2018-01-12: qty 2

## 2018-01-12 MED ORDER — PALIPERIDONE ER 6 MG PO TB24
6.0000 mg | ORAL_TABLET | Freq: Every day | ORAL | Status: DC
  Filled 2018-01-12: qty 1
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
## 2018-01-12 NOTE — ED Notes (Signed)
Patient placed in room 40.  Belongings placed in locker 67.  Patient asked to take shower almost immediately upon arriving to unit.  Patient is in shower at this time.
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
## 2018-01-12 NOTE — ED Notes (Signed)
Attempted to wake up pt. for scheduled med., three times, unsuccessful. Pt. Turned to his sides ,still asleep.

## 2018-01-12 NOTE — BH Assessment (Signed)
[facility] Assessment Progress Note   Clinician attempted to engage patient in assessment.  Pt would not speak, nor make eye contact.  Pt acted like he was asleep.  Pt has warrant for arrest once he is discharged.

## 2018-01-12 NOTE — Discharge Instructions (Signed)
For your behavioral health needs, you are advised to continue treatment with Terrilyn Saver, MD:       Terrilyn Saver, MD      [facility]      [address] [place] [address]      [place], [place] [address]      [phone]

## 2018-01-13 ENCOUNTER — Other Ambulatory Visit: Payer: Self-pay

## 2018-01-13 ENCOUNTER — Encounter (HOSPITAL_COMMUNITY): Payer: Self-pay | Admitting: Emergency Medicine

## 2018-01-13 DIAGNOSIS — M79671 Pain in right foot: Secondary | ICD-10-CM | POA: Insufficient documentation

## 2018-01-13 DIAGNOSIS — J449 Chronic obstructive pulmonary disease, unspecified: Secondary | ICD-10-CM | POA: Insufficient documentation

## 2018-01-13 DIAGNOSIS — M549 Dorsalgia, unspecified: Secondary | ICD-10-CM | POA: Insufficient documentation

## 2018-01-13 DIAGNOSIS — Z79899 Other long term (current) drug therapy: Secondary | ICD-10-CM | POA: Insufficient documentation

## 2018-01-13 DIAGNOSIS — I509 Heart failure, unspecified: Secondary | ICD-10-CM | POA: Insufficient documentation

## 2018-01-13 DIAGNOSIS — G8929 Other chronic pain: Secondary | ICD-10-CM | POA: Diagnosis not present

## 2018-01-13 DIAGNOSIS — F17210 Nicotine dependence, cigarettes, uncomplicated: Secondary | ICD-10-CM | POA: Diagnosis not present

## 2018-01-13 NOTE — ED Triage Notes (Signed)
Pt presents by EMS for evaluation of back pain along with right heel pain due to walking.

## 2018-01-14 ENCOUNTER — Encounter (HOSPITAL_COMMUNITY): Payer: Self-pay | Admitting: Emergency Medicine

## 2018-01-14 ENCOUNTER — Emergency Department (HOSPITAL_COMMUNITY)
Admission: EM | Admit: 2018-01-14 | Discharge: 2018-01-14 | Discharge status: Against Medical Advice | Payer: Medicare Other | Source: Home / Self Care

## 2018-01-14 ENCOUNTER — Emergency Department (HOSPITAL_COMMUNITY)
Admission: EM | Admit: 2018-01-14 | Discharge: 2018-01-14 | Disposition: A | Discharge status: Home / Self Care | Payer: Medicare Other | Attending: Emergency Medicine | Admitting: Emergency Medicine

## 2018-01-14 DIAGNOSIS — M79671 Pain in right foot: Secondary | ICD-10-CM

## 2018-01-14 LAB — COMPREHENSIVE METABOLIC PANEL
ALK PHOS: 55 U/L (ref 38–126)
ALT: 20 U/L (ref 17–63)
ANION GAP: 11 (ref 5–15)
AST: 29 U/L (ref 15–41)
Albumin: 4.6 g/dL (ref 3.5–5.0)
BILIRUBIN TOTAL: 1.3 mg/dL — AB (ref 0.3–1.2)
BUN: 11 mg/dL (ref 6–20)
CO2: 26 mmol/L (ref 22–32)
Calcium: 10.1 mg/dL (ref 8.9–10.3)
Chloride: 107 mmol/L (ref 101–111)
Creatinine, Ser: 0.99 mg/dL (ref 0.61–1.24)
GFR calc non Af Amer: >60 mL/min (ref >60)
GLUCOSE: 102 mg/dL — AB (ref 65–99)
Potassium: 4.6 mmol/L (ref 3.5–5.1)
Sodium: 144 mmol/L (ref 135–145)
TOTAL PROTEIN: 7.8 g/dL (ref 6.5–8.1)

## 2018-01-14 LAB — CBC
HCT: 47.3 % (ref 39.0–52.0)
HEMOGLOBIN: 16 g/dL (ref 13.0–17.0)
MCH: 30.1 pg (ref 26.0–34.0)
MCHC: 33.8 g/dL (ref 30.0–36.0)
MCV: 88.9 fL (ref 78.0–100.0)
Platelets: 224 10*3/uL (ref 150–400)
RBC: 5.32 MIL/uL (ref 4.22–5.81)
RDW: 12.8 % (ref 11.5–15.5)
WBC: 11.5 10*3/uL — ABNORMAL HIGH (ref 4.0–10.5)

## 2018-01-14 LAB — LIPASE, BLOOD: Lipase: 31 U/L (ref 11–51)

## 2018-01-14 NOTE — Discharge Instructions (Signed)
Can use tylenol or motrin for foot pain. Follow-up with your primary care doctor. Return to the ED for new or worsening symptoms.

## 2018-01-14 NOTE — ED Triage Notes (Signed)
Patient here from home with complaints of abdominal pain. States "everything is hurting". When asked where stomach is hurting patient points all over. States "I dont know why im here".

## 2018-01-14 NOTE — ED Provider Notes (Signed)
[facility] DEPT Provider Note   CSN: [id number] Arrival date & time: 01/13/18  2328     History   Chief Complaint Chief Complaint  Patient presents with  . Back Pain  . Foot Pain    HPI [name] is a 47 y.o. male.  The history is provided by the patient and medical records.    47 y.o. M with hx of asthma, CHF, chronic back pain, COPD, schizophrenia, presenting to the ED for right foot pain.  He reports a lot of pain in the right heel.  States he walks a lot because he doesn't have any other way to get around.  He also has chronic back pain but states that it feeling better since he has been able to lay down.  States he would like to rest here for a little while.  He has no numbness/weakness of the legs.  No bowel or bladder incontinence.    Past Medical History:  Diagnosis Date  . Asthma   . CHF (congestive heart failure) ([place])   . Chronic back pain   . COPD (chronic obstructive pulmonary disease) ([place])   . Knee pain, chronic   . Migraines   . Schizophrenia, schizo-affective [facility])     Patient Active Problem List   Diagnosis Date Noted  . Schizophrenia, paranoid type ([place]) 08/19/2017  . Hallucinations 07/29/2014  . Hypokalemia 04/28/2014  . Drug overdose 04/27/2014  . Schizoaffective disorder, bipolar type ([place]) 03/02/2014  . Anxiety state 03/02/2014  . Benzodiazepine dependence ([place]) 03/02/2014  . Chest pain 10/11/2013  . EKG abnormalities 10/11/2013  . Chronic back pain     Past Surgical History:  Procedure Laterality Date  . extraction of wisdom teeth    . KNEE SURGERY     four times        Home Medications    Prior to Admission medications   Medication Sig Start Date End Date Taking? Authorizing Provider  ALPRAZolam [name]) 0.5 MG tablet Take 1 mg by mouth QID.     [provider]  azithromycin (ZITHROMAX) 250 MG tablet Take 1 tablet (250 mg total) by mouth daily. Take first 2 tablets together, then 1 every  day until finished. 10/29/17   Tasia Catchings, Amy V, PA-C  diphenhydrAMINE (BENADRYL) 25 mg capsule Take 1 capsule (25 mg total) by mouth at bedtime as needed for sleep. 08/25/17   Lindell Spar I, NP  docusate sodium (COLACE) 100 MG capsule Take 1 capsule (100 mg total) by mouth at bedtime. (May purchase from over the counter): For constipation 08/25/17   Lindell Spar I, NP  HYDROcodone-acetaminophen (NORCO) 7.5-325 MG tablet Take 1 tablet by mouth every 8 (eight) hours as needed for moderate pain.     [provider]  hydrocortisone (ANUSOL-HC) 2.5 % rectal cream Place rectally 3 (three) times daily as needed for hemorrhoids or anal itching. 08/25/17   Lindell Spar I, NP  hydrOXYzine (ATARAX/VISTARIL) 50 MG tablet Take 1 tablet (50 mg total) by mouth every 6 (six) hours as needed for anxiety. 08/25/17   Lindell Spar I, NP  risperiDONE (RISPERDAL) 3 MG tablet Take 3 mg by mouth at bedtime.    [provider]  tiZANidine (ZANAFLEX) 4 MG capsule Take 4 mg by mouth 3 (three) times daily as needed for muscle spasms.    [provider]    Family History Family History  Problem Relation Age of Onset  . Hypertension Father     Social History Social  History   Tobacco Use  . Smoking status: Current Every Day Smoker    Packs/day: 2.00    Years: 10.00    Pack years: 20.00    Types: Cigarettes  . Smokeless tobacco: Never Used  Substance Use Topics  . Alcohol use: No    Comment: occ  . Drug use: No     Allergies   Amoxicillin; Dextromethorphan-guaifenesin; Haloperidol; Meloxicam; Nsaids; Prednisone; Quetiapine; Sudafed [pseudoephedrine hcl]; and Ziprasidone   Review of Systems Review of Systems  Musculoskeletal: Positive for arthralgias and back pain.  All other systems reviewed and are negative.    Physical Exam Updated Vital Signs BP 118/89 (BP Location: Left Arm)   Pulse 94   Temp 99.6 F (37.6 C) (Oral)   Resp 18   Ht 6\' 1"  (1.854 m)   Wt 103.4 kg (228 lb)    SpO2 99%   BMI 30.08 kg/m   Physical Exam  Constitutional: He is oriented to person, place, and time. He appears well-developed and well-nourished.  HENT:  Head: Normocephalic and atraumatic.  Mouth/Throat: Oropharynx is clear and moist.  Eyes: Pupils are equal, round, and reactive to light. Conjunctivae and EOM are normal.  Neck: Normal range of motion.  Cardiovascular: Normal rate, regular rhythm and normal heart sounds.  Pulmonary/Chest: Effort normal and breath sounds normal.  Abdominal: Soft. Bowel sounds are normal.  Musculoskeletal: Normal range of motion.  Feet are dirty, reports pain in the right heel-- no puncture wounds, sores, lacerations, or bony deformities; foot is NVI  Neurological: He is alert and oriented to person, place, and time.  Skin: Skin is warm and dry.  Psychiatric: He has a normal mood and affect.  Nursing note and vitals reviewed.    ED Treatments / Results  Labs (all labs ordered are listed, but only abnormal results are displayed) Labs Reviewed - No data to display  EKG None  Radiology No results found.  Procedures Procedures (including critical care time)  Medications Ordered in ED Medications - No data to display   Initial Impression / Assessment and Plan / ED Course  I have reviewed the triage vital signs and the nursing notes.  Pertinent labs & imaging results that were available during my care of the patient were reviewed by me and considered in my medical decision making (see chart for details).  47 year old male here with right foot pain.  Has chronic back pain, states is better once he is lying flat in the ED stretcher.  States his right foot hurts because he walks around a lot as that is his only means of transportation.  There are no open wounds, sores, or bony deformities about the foot.  Upon further questioning, patient states he just wants to rest for a little bit here.  He does not have any acute emergent pathology that would  require further work-up or admission at this time.  Can be discharged home to follow-up with PCP.  Discussed plan with patient, he acknowledged understanding and agreed with plan of care.  Return precautions given for new or worsening symptoms.  Final Clinical Impressions(s) / ED Diagnoses   Final diagnoses:  Foot pain, right    ED Discharge Orders    None       Kathryne Hitch 01/14/18 [place]    Jola Schmidt, MD 01/15/18 [phone]

## 2018-01-14 NOTE — ED Notes (Signed)
Pt was last seen outside with security and Education officer, museum. Pt was escorted to the bus spot.

## 2018-01-15 ENCOUNTER — Emergency Department (HOSPITAL_COMMUNITY)
Admission: EM | Admit: 2018-01-15 | Discharge: 2018-01-15 | Disposition: A | Discharge status: Home / Self Care | Payer: Medicare Other | Attending: Emergency Medicine | Admitting: Emergency Medicine

## 2018-01-15 ENCOUNTER — Emergency Department (HOSPITAL_COMMUNITY): Payer: Medicare Other

## 2018-01-15 ENCOUNTER — Other Ambulatory Visit: Payer: Self-pay

## 2018-01-15 ENCOUNTER — Encounter (HOSPITAL_COMMUNITY): Payer: Self-pay | Admitting: Emergency Medicine

## 2018-01-15 DIAGNOSIS — Z79899 Other long term (current) drug therapy: Secondary | ICD-10-CM | POA: Insufficient documentation

## 2018-01-15 DIAGNOSIS — F17210 Nicotine dependence, cigarettes, uncomplicated: Secondary | ICD-10-CM | POA: Diagnosis not present

## 2018-01-15 DIAGNOSIS — R443 Hallucinations, unspecified: Secondary | ICD-10-CM

## 2018-01-15 DIAGNOSIS — F209 Schizophrenia, unspecified: Secondary | ICD-10-CM | POA: Insufficient documentation

## 2018-01-15 DIAGNOSIS — I509 Heart failure, unspecified: Secondary | ICD-10-CM | POA: Insufficient documentation

## 2018-01-15 DIAGNOSIS — J449 Chronic obstructive pulmonary disease, unspecified: Secondary | ICD-10-CM | POA: Diagnosis not present

## 2018-01-15 DIAGNOSIS — R442 Other hallucinations: Secondary | ICD-10-CM | POA: Diagnosis present

## 2018-01-15 DIAGNOSIS — F1310 Sedative, hypnotic or anxiolytic abuse, uncomplicated: Secondary | ICD-10-CM

## 2018-01-15 LAB — ETHANOL: Alcohol, Ethyl (B): <10 mg/dL (ref <10)

## 2018-01-15 LAB — COMPREHENSIVE METABOLIC PANEL
ALT: 20 U/L (ref 17–63)
AST: 29 U/L (ref 15–41)
Albumin: 4.1 g/dL (ref 3.5–5.0)
Alkaline Phosphatase: 54 U/L (ref 38–126)
Anion gap: 9 (ref 5–15)
BUN: 16 mg/dL (ref 6–20)
CO2: 26 mmol/L (ref 22–32)
Calcium: 9.5 mg/dL (ref 8.9–10.3)
Chloride: 106 mmol/L (ref 101–111)
Creatinine, Ser: 1.18 mg/dL (ref 0.61–1.24)
GFR calc Af Amer: >60 mL/min (ref >60)
GFR calc non Af Amer: >60 mL/min (ref >60)
Glucose, Bld: 103 mg/dL — ABNORMAL HIGH (ref 65–99)
Potassium: 3.6 mmol/L (ref 3.5–5.1)
Sodium: 141 mmol/L (ref 135–145)
Total Bilirubin: 1.2 mg/dL (ref 0.3–1.2)
Total Protein: 7 g/dL (ref 6.5–8.1)

## 2018-01-15 LAB — SALICYLATE LEVEL: Salicylate Lvl: <7 mg/dL (ref 2.8–30.0)

## 2018-01-15 LAB — CBC
HCT: 45.8 % (ref 39.0–52.0)
Hemoglobin: 15.5 g/dL (ref 13.0–17.0)
MCH: 30.1 pg (ref 26.0–34.0)
MCHC: 33.8 g/dL (ref 30.0–36.0)
MCV: 88.9 fL (ref 78.0–100.0)
Platelets: 204 10*3/uL (ref 150–400)
RBC: 5.15 MIL/uL (ref 4.22–5.81)
RDW: 12.9 % (ref 11.5–15.5)
WBC: 10.5 10*3/uL (ref 4.0–10.5)

## 2018-01-15 LAB — ACETAMINOPHEN LEVEL: Acetaminophen (Tylenol), Serum: <10 ug/mL — ABNORMAL LOW (ref 10–30)

## 2018-01-15 NOTE — ED Notes (Signed)
Staffing notified for sitter 
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
## 2018-01-15 NOTE — ED Notes (Signed)
Pt left for CT.

## 2018-01-15 NOTE — ED Notes (Addendum)
Pt wanded by security. Pt's belongings placed in small locker #2.

## 2018-01-15 NOTE — ED Notes (Signed)
ORDERED LUNCH FOR PT
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
## 2018-01-15 NOTE — ED Notes (Signed)
Low Sodium Heart Healthy Diet ordered for Dinner.

## 2018-01-15 NOTE — Progress Notes (Signed)
Patient is seen by me via tele-psych and I have consulted Dr. Dwyane Dee. Patient does not meet inpatient criteria. Patient denies any SI/HI/AVH and contracts for safety.

## 2018-01-15 NOTE — Progress Notes (Signed)
Per [employer], [place], Pt has been psychiatrically cleared. CSW notified [facility] ED RN.   Audree Camel, LCSW, [place] Disposition [facility]/TTS [phone]

## 2018-01-15 NOTE — Discharge Instructions (Addendum)
Call to schedule a follow-up appointment from your emergency department visit today.  Only take medications that are prescribed to you and only take the amount of medication at one time that you have been prescribed.  Return for new or worsening symptoms including suicidal ideation, thoughts of harming others, or other new or worsening symptoms.
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
## 2018-03-05 ENCOUNTER — Emergency Department (HOSPITAL_COMMUNITY)
Admission: EM | Admit: 2018-03-05 | Discharge: 2018-03-05 | Disposition: A | Discharge status: Home / Self Care | Payer: Medicare Other | Attending: Emergency Medicine | Admitting: Emergency Medicine

## 2018-03-05 ENCOUNTER — Encounter (HOSPITAL_COMMUNITY): Payer: Self-pay | Admitting: Nurse Practitioner

## 2018-03-05 DIAGNOSIS — F19920 Other psychoactive substance use, unspecified with intoxication, uncomplicated: Secondary | ICD-10-CM | POA: Diagnosis not present

## 2018-03-05 DIAGNOSIS — M7918 Myalgia, other site: Secondary | ICD-10-CM | POA: Insufficient documentation

## 2018-03-05 DIAGNOSIS — J45909 Unspecified asthma, uncomplicated: Secondary | ICD-10-CM | POA: Insufficient documentation

## 2018-03-05 DIAGNOSIS — J449 Chronic obstructive pulmonary disease, unspecified: Secondary | ICD-10-CM | POA: Diagnosis not present

## 2018-03-05 DIAGNOSIS — R4781 Slurred speech: Secondary | ICD-10-CM | POA: Diagnosis not present

## 2018-03-05 DIAGNOSIS — E860 Dehydration: Secondary | ICD-10-CM | POA: Diagnosis present

## 2018-03-05 DIAGNOSIS — F17210 Nicotine dependence, cigarettes, uncomplicated: Secondary | ICD-10-CM | POA: Insufficient documentation

## 2018-03-05 DIAGNOSIS — Z79899 Other long term (current) drug therapy: Secondary | ICD-10-CM | POA: Insufficient documentation

## 2018-03-05 DIAGNOSIS — I509 Heart failure, unspecified: Secondary | ICD-10-CM | POA: Insufficient documentation

## 2018-03-05 LAB — COMPREHENSIVE METABOLIC PANEL
ALBUMIN: 4.1 g/dL (ref 3.5–5.0)
ALK PHOS: 56 U/L (ref 38–126)
ALT: 22 U/L (ref 0–44)
ANION GAP: 12 (ref 5–15)
AST: 26 U/L (ref 15–41)
BUN: 6 mg/dL (ref 6–20)
CALCIUM: 9.6 mg/dL (ref 8.9–10.3)
CHLORIDE: 107 mmol/L (ref 98–111)
CO2: 24 mmol/L (ref 22–32)
Creatinine, Ser: 0.82 mg/dL (ref 0.61–1.24)
GFR calc non Af Amer: >60 mL/min (ref >60)
GLUCOSE: 95 mg/dL (ref 70–99)
POTASSIUM: 3.7 mmol/L (ref 3.5–5.1)
Sodium: 143 mmol/L (ref 135–145)
Total Bilirubin: 0.7 mg/dL (ref 0.3–1.2)
Total Protein: 7.1 g/dL (ref 6.5–8.1)

## 2018-03-05 LAB — CBC
HEMATOCRIT: 48.4 % (ref 39.0–52.0)
HEMOGLOBIN: 16.2 g/dL (ref 13.0–17.0)
MCH: 29.8 pg (ref 26.0–34.0)
MCHC: 33.5 g/dL (ref 30.0–36.0)
MCV: 89.1 fL (ref 78.0–100.0)
Platelets: 230 10*3/uL (ref 150–400)
RBC: 5.43 MIL/uL (ref 4.22–5.81)
RDW: 12.5 % (ref 11.5–15.5)
WBC: 7.3 10*3/uL (ref 4.0–10.5)

## 2018-03-05 LAB — LIPASE, BLOOD: LIPASE: 23 U/L (ref 11–51)

## 2018-03-05 LAB — CK: Total CK: 305 U/L (ref 49–397)

## 2018-03-05 NOTE — ED Notes (Signed)
Bed: [facility] Expected date:  Expected time:  Means of arrival:  Comments: 
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
## 2018-03-05 NOTE — ED Notes (Signed)
Patient was brought in by [employer]. Patient c/o seizures and heat exhaustion. [employer] requesting a legal blood draw,

## 2018-03-05 NOTE — ED Triage Notes (Signed)
Pt states he is dehydrated and is experiencing body aches.

## 2018-03-15 ENCOUNTER — Other Ambulatory Visit: Payer: Self-pay

## 2018-03-15 ENCOUNTER — Encounter (HOSPITAL_COMMUNITY): Payer: Self-pay | Admitting: Emergency Medicine

## 2018-03-15 ENCOUNTER — Emergency Department (HOSPITAL_COMMUNITY)
Admission: EM | Admit: 2018-03-15 | Discharge: 2018-03-15 | Discharge status: Against Medical Advice | Payer: Medicare Other | Attending: Emergency Medicine | Admitting: Emergency Medicine

## 2018-03-15 DIAGNOSIS — F17210 Nicotine dependence, cigarettes, uncomplicated: Secondary | ICD-10-CM | POA: Insufficient documentation

## 2018-03-15 DIAGNOSIS — E876 Hypokalemia: Secondary | ICD-10-CM | POA: Diagnosis not present

## 2018-03-15 DIAGNOSIS — F250 Schizoaffective disorder, bipolar type: Secondary | ICD-10-CM

## 2018-03-15 DIAGNOSIS — J449 Chronic obstructive pulmonary disease, unspecified: Secondary | ICD-10-CM | POA: Insufficient documentation

## 2018-03-15 DIAGNOSIS — I509 Heart failure, unspecified: Secondary | ICD-10-CM | POA: Diagnosis not present

## 2018-03-15 DIAGNOSIS — Z79899 Other long term (current) drug therapy: Secondary | ICD-10-CM | POA: Diagnosis not present

## 2018-03-15 DIAGNOSIS — R569 Unspecified convulsions: Secondary | ICD-10-CM | POA: Diagnosis present

## 2018-03-15 LAB — COMPREHENSIVE METABOLIC PANEL
ALT: 21 U/L (ref 0–44)
AST: 30 U/L (ref 15–41)
Albumin: 4.1 g/dL (ref 3.5–5.0)
Alkaline Phosphatase: 63 U/L (ref 38–126)
Anion gap: 9 (ref 5–15)
BILIRUBIN TOTAL: 0.9 mg/dL (ref 0.3–1.2)
BUN: 8 mg/dL (ref 6–20)
CO2: 27 mmol/L (ref 22–32)
CREATININE: 0.92 mg/dL (ref 0.61–1.24)
Calcium: 9.8 mg/dL (ref 8.9–10.3)
Chloride: 107 mmol/L (ref 98–111)
GFR calc non Af Amer: >60 mL/min (ref >60)
Glucose, Bld: 115 mg/dL — ABNORMAL HIGH (ref 70–99)
POTASSIUM: 3.2 mmol/L — AB (ref 3.5–5.1)
Sodium: 143 mmol/L (ref 135–145)
Total Protein: 7.2 g/dL (ref 6.5–8.1)

## 2018-03-15 LAB — CBC WITH DIFFERENTIAL/PLATELET
BASOS ABS: 0 10*3/uL (ref 0.0–0.1)
Basophils Relative: 0 %
Eosinophils Absolute: 0 10*3/uL (ref 0.0–0.7)
Eosinophils Relative: 0 %
HCT: 46.5 % (ref 39.0–52.0)
HEMOGLOBIN: 16 g/dL (ref 13.0–17.0)
Lymphocytes Relative: 15 %
Lymphs Abs: 1.4 10*3/uL (ref 0.7–4.0)
MCH: 30.3 pg (ref 26.0–34.0)
MCHC: 34.4 g/dL (ref 30.0–36.0)
MCV: 88.1 fL (ref 78.0–100.0)
Monocytes Absolute: 0.7 10*3/uL (ref 0.1–1.0)
Monocytes Relative: 7 %
NEUTROS ABS: 7.4 10*3/uL (ref 1.7–7.7)
Neutrophils Relative %: 78 %
Platelets: 221 10*3/uL (ref 150–400)
RBC: 5.28 MIL/uL (ref 4.22–5.81)
RDW: 12.7 % (ref 11.5–15.5)
WBC: 9.5 10*3/uL (ref 4.0–10.5)

## 2018-03-15 LAB — SALICYLATE LEVEL: Salicylate Lvl: <7 mg/dL (ref 2.8–30.0)

## 2018-03-15 LAB — ACETAMINOPHEN LEVEL: Acetaminophen (Tylenol), Serum: <10 ug/mL — ABNORMAL LOW (ref 10–30)

## 2018-03-15 LAB — CBG MONITORING, ED: GLUCOSE-CAPILLARY: 103 mg/dL — AB (ref 70–99)

## 2018-03-15 LAB — ETHANOL

## 2018-03-15 MED ORDER — HYDROXYZINE HCL 25 MG PO TABS: 50.0000 mg | ORAL_TABLET | Freq: Four times a day (QID) | ORAL | Status: DC | PRN

## 2018-03-15 NOTE — ED Notes (Addendum)
Pt pulled IV out. When questioned by staff why he removed his IV, pt responded "I don't want that shit." Nurse told pt all he had to do was ask someone to take it out and we could have removed it safely. Pt then became irate and pulled all cardiac monitor leads off, disconnected BP cuff, and ripped his gown off. Pt continued yelling at nurse. Nurse told pt he needs to have area covered where he removed the IV because it was heavily dripping blood. Pt responded "Fuck this, I don't need any of this shit." getting closer to nurse. Nurse called for security. Pt proceeded to get dressed then started walking out of EMS doors. Pt followed by [employer] and security. MD made aware of pt elopement.
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
## 2018-03-15 NOTE — ED Notes (Signed)
Bed: WA16 Expected date:  Expected time:  Means of arrival:  Comments: ems 

## 2018-03-15 NOTE — ED Notes (Signed)
Pt calling 911 from his cell phone. Asked pt to please stop calling 911 since he is in the hospital and has been seen by the doctor.

## 2018-03-15 NOTE — Progress Notes (Signed)
TTS consulted with Patriciaann Clan, PA who recommends continued observation for safety and stabilization and to be reassessed in the AM by psych. EDP Deno Etienne, DO and pt's nurse Ria Comment, RN have been advised of the disposition.   Lind Covert, MSW, LCSW Therapeutic Triage Specialist  [phone]

## 2018-03-15 NOTE — ED Triage Notes (Signed)
Pt arriving via [facility] for possible seizure. Pt sister states pt was talking to her then "went stiff". No tonic-clonic activity noted. Pt A&O x3 but still showing signs of confusion. Pt has hx of schizophrenia and is noncompliant with medications.

## 2018-03-17 ENCOUNTER — Emergency Department (HOSPITAL_COMMUNITY)
Admission: EM | Admit: 2018-03-17 | Discharge: 2018-03-17 | Disposition: A | Discharge status: Home / Self Care | Payer: Medicare Other | Attending: Emergency Medicine | Admitting: Emergency Medicine

## 2018-03-17 DIAGNOSIS — F250 Schizoaffective disorder, bipolar type: Secondary | ICD-10-CM | POA: Diagnosis not present

## 2018-03-17 DIAGNOSIS — R5383 Other fatigue: Secondary | ICD-10-CM | POA: Diagnosis present

## 2018-03-17 DIAGNOSIS — J449 Chronic obstructive pulmonary disease, unspecified: Secondary | ICD-10-CM | POA: Diagnosis not present

## 2018-03-17 DIAGNOSIS — I509 Heart failure, unspecified: Secondary | ICD-10-CM | POA: Insufficient documentation

## 2018-03-17 DIAGNOSIS — F17210 Nicotine dependence, cigarettes, uncomplicated: Secondary | ICD-10-CM | POA: Diagnosis not present

## 2018-03-17 DIAGNOSIS — F209 Schizophrenia, unspecified: Secondary | ICD-10-CM

## 2018-03-17 LAB — COMPREHENSIVE METABOLIC PANEL
ALT: 40 U/L (ref 0–44)
AST: 70 U/L — ABNORMAL HIGH (ref 15–41)
Albumin: 3.7 g/dL (ref 3.5–5.0)
Alkaline Phosphatase: 59 U/L (ref 38–126)
Anion gap: 7 (ref 5–15)
BILIRUBIN TOTAL: 0.6 mg/dL (ref 0.3–1.2)
BUN: 11 mg/dL (ref 6–20)
CO2: 28 mmol/L (ref 22–32)
CREATININE: 0.69 mg/dL (ref 0.61–1.24)
Calcium: 9 mg/dL (ref 8.9–10.3)
Chloride: 108 mmol/L (ref 98–111)
Glucose, Bld: 99 mg/dL (ref 70–99)
Potassium: 3.6 mmol/L (ref 3.5–5.1)
Sodium: 143 mmol/L (ref 135–145)
TOTAL PROTEIN: 6.6 g/dL (ref 6.5–8.1)

## 2018-03-17 LAB — ETHANOL

## 2018-03-17 LAB — CBC WITH DIFFERENTIAL/PLATELET
BASOS ABS: 0 10*3/uL (ref 0.0–0.1)
BASOS PCT: 1 %
EOS ABS: 0.1 10*3/uL (ref 0.0–0.7)
EOS PCT: 1 %
HCT: 45.2 % (ref 39.0–52.0)
Hemoglobin: 15.1 g/dL (ref 13.0–17.0)
Lymphocytes Relative: 25 %
Lymphs Abs: 2.1 10*3/uL (ref 0.7–4.0)
MCH: 29.8 pg (ref 26.0–34.0)
MCHC: 33.4 g/dL (ref 30.0–36.0)
MCV: 89.2 fL (ref 78.0–100.0)
MONO ABS: 0.7 10*3/uL (ref 0.1–1.0)
Monocytes Relative: 9 %
Neutro Abs: 5.5 10*3/uL (ref 1.7–7.7)
Neutrophils Relative %: 64 %
PLATELETS: 187 10*3/uL (ref 150–400)
RBC: 5.07 MIL/uL (ref 4.22–5.81)
RDW: 12.9 % (ref 11.5–15.5)
WBC: 8.4 10*3/uL (ref 4.0–10.5)

## 2018-03-17 NOTE — ED Notes (Signed)
Bed: UR42 Expected date:  Expected time:  Means of arrival:  Comments: EMS/lethargy
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
## 2018-03-17 NOTE — ED Notes (Signed)
Pt is unable to elaborate on condition. Pt stated, "I don't talk to strangers, I don't think we can help each other." Pt will mutter under his breath and will not speak up so this writer can understand what he is saying when asked multiple times.
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
## 2018-03-17 NOTE — Discharge Instructions (Signed)
Follow-up with your doctors, continue your current medications

## 2018-03-19 ENCOUNTER — Encounter (HOSPITAL_COMMUNITY): Payer: Self-pay

## 2018-03-19 ENCOUNTER — Other Ambulatory Visit: Payer: Self-pay

## 2018-03-19 ENCOUNTER — Emergency Department (HOSPITAL_COMMUNITY)
Admission: EM | Admit: 2018-03-19 | Discharge: 2018-03-20 | Disposition: A | Discharge status: Home / Self Care | Payer: Medicare Other | Attending: Emergency Medicine | Admitting: Emergency Medicine

## 2018-03-19 DIAGNOSIS — Z046 Encounter for general psychiatric examination, requested by authority: Secondary | ICD-10-CM | POA: Insufficient documentation

## 2018-03-19 DIAGNOSIS — R462 Strange and inexplicable behavior: Secondary | ICD-10-CM | POA: Diagnosis present

## 2018-03-19 DIAGNOSIS — F17210 Nicotine dependence, cigarettes, uncomplicated: Secondary | ICD-10-CM | POA: Diagnosis not present

## 2018-03-19 DIAGNOSIS — I509 Heart failure, unspecified: Secondary | ICD-10-CM | POA: Diagnosis not present

## 2018-03-19 DIAGNOSIS — J449 Chronic obstructive pulmonary disease, unspecified: Secondary | ICD-10-CM | POA: Diagnosis not present

## 2018-03-19 DIAGNOSIS — R443 Hallucinations, unspecified: Secondary | ICD-10-CM

## 2018-03-19 DIAGNOSIS — F250 Schizoaffective disorder, bipolar type: Secondary | ICD-10-CM | POA: Diagnosis not present

## 2018-03-19 LAB — CBC
HCT: 47.4 % (ref 39.0–52.0)
Hemoglobin: 15.9 g/dL (ref 13.0–17.0)
MCH: 29.8 pg (ref 26.0–34.0)
MCHC: 33.5 g/dL (ref 30.0–36.0)
MCV: 88.9 fL (ref 78.0–100.0)
PLATELETS: 205 10*3/uL (ref 150–400)
RBC: 5.33 MIL/uL (ref 4.22–5.81)
RDW: 12.7 % (ref 11.5–15.5)
WBC: 7.1 10*3/uL (ref 4.0–10.5)

## 2018-03-19 LAB — COMPREHENSIVE METABOLIC PANEL
ALBUMIN: 3.6 g/dL (ref 3.5–5.0)
ALT: 34 U/L (ref 0–44)
AST: 33 U/L (ref 15–41)
Alkaline Phosphatase: 56 U/L (ref 38–126)
Anion gap: 6 (ref 5–15)
BUN: 9 mg/dL (ref 6–20)
CHLORIDE: 107 mmol/L (ref 98–111)
CO2: 29 mmol/L (ref 22–32)
Calcium: 9.1 mg/dL (ref 8.9–10.3)
Creatinine, Ser: 0.71 mg/dL (ref 0.61–1.24)
GFR calc Af Amer: >60 mL/min (ref >60)
GLUCOSE: 101 mg/dL — AB (ref 70–99)
POTASSIUM: 4 mmol/L (ref 3.5–5.1)
Sodium: 142 mmol/L (ref 135–145)
TOTAL PROTEIN: 6.6 g/dL (ref 6.5–8.1)
Total Bilirubin: 0.9 mg/dL (ref 0.3–1.2)

## 2018-03-19 LAB — ETHANOL

## 2018-03-19 LAB — RAPID URINE DRUG SCREEN, HOSP PERFORMED
AMPHETAMINES: NOT DETECTED
BENZODIAZEPINES: NOT DETECTED
COCAINE: NOT DETECTED
OPIATES: NOT DETECTED
Tetrahydrocannabinol: NOT DETECTED

## 2018-03-19 LAB — ACETAMINOPHEN LEVEL

## 2018-03-19 LAB — SALICYLATE LEVEL: Salicylate Lvl: <7 mg/dL (ref 2.8–30.0)

## 2018-03-19 MED ORDER — NICOTINE 21 MG/24HR TD PT24: 21.0000 mg | MEDICATED_PATCH | Freq: Every day | TRANSDERMAL | Status: DC

## 2018-03-19 MED ORDER — ONDANSETRON HCL 4 MG PO TABS: 4.0000 mg | ORAL_TABLET | Freq: Three times a day (TID) | ORAL | Status: DC | PRN

## 2018-03-19 MED ORDER — ALUM & MAG HYDROXIDE-SIMETH 200-200-20 MG/5ML PO SUSP: 30.0000 mL | Freq: Four times a day (QID) | ORAL | Status: DC | PRN

## 2018-03-19 MED ORDER — ZOLPIDEM TARTRATE 5 MG PO TABS: 5.0000 mg | ORAL_TABLET | Freq: Every evening | ORAL | Status: DC | PRN

## 2018-03-19 MED ORDER — ACETAMINOPHEN 325 MG PO TABS: 650.0000 mg | ORAL_TABLET | ORAL | Status: DC | PRN

## 2018-03-19 NOTE — ED Notes (Signed)
Pt has wallet with no cash  Multiple credit cards, watch , keys and cell phone that has been placed  In locker 27.

## 2018-03-19 NOTE — ED Notes (Signed)
  Pt resting watching T.V. Alert x4, flat effect, has answered all questions appropriately, calm and cooperative.I will continue to monitor.
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
## 2018-03-19 NOTE — BH Assessment (Signed)
Attempted to contact pt on tele-assessment machine for TTS Assessment but there was no answer. Will attempt at a later time.
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
## 2018-03-19 NOTE — ED Notes (Signed)
Bed: WA27 Expected date:  Expected time:  Means of arrival:  Comments: HOLD

## 2018-03-20 DIAGNOSIS — F250 Schizoaffective disorder, bipolar type: Secondary | ICD-10-CM | POA: Diagnosis not present

## 2018-03-20 NOTE — ED Notes (Signed)
Pt refused to sign.  
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
## 2018-03-20 NOTE — [facility] Suicide Risk Assessment (Signed)
Suicide Risk Assessment  Discharge Assessment   [facility] Discharge Suicide Risk Assessment   Principal Problem: Schizoaffective disorder, bipolar type [facility]) Discharge Diagnoses:  Patient Active Problem List   Diagnosis Date Noted  . Schizoaffective disorder, bipolar type ([place]) [F25.0] 03/02/2014    Priority: High  . Schizophrenia, paranoid type ([place]) [F20.0] 08/19/2017  . Hypokalemia [E87.6] 04/28/2014  . Drug overdose [T50.901A] 04/27/2014  . Anxiety state [F41.1] 03/02/2014  . Benzodiazepine dependence ([place]) [F13.20] 03/02/2014  . Chest pain [R07.9] 10/11/2013  . EKG abnormalities [R94.31] 10/11/2013  . Chronic back pain [M54.9, G89.29]     Total Time spent with patient: 45 minutes   Musculoskeletal: Strength & Muscle Tone: within normal limits Gait & Station: normal Patient leans: N/A  Psychiatric Specialty Exam: Physical Exam  Nursing note and vitals reviewed. Constitutional: He is oriented to person, place, and time. He appears well-developed and well-nourished.  HENT:  Head: Normocephalic.  Neck: Normal range of motion.  Respiratory: Effort normal.  Musculoskeletal: Normal range of motion.  Neurological: He is alert and oriented to person, place, and time.  Psychiatric: His speech is normal and behavior is normal. Judgment and thought content normal. His affect is blunt. Cognition and memory are normal.    Review of Systems  Psychiatric/Behavioral: Negative.   All other systems reviewed and are negative.   Blood pressure 99/74, pulse (!) 44, temperature 98.2 F (36.8 C), temperature source Oral, resp. rate 16, height 6\' 1"  (1.854 m), weight 90.7 kg (200 lb), SpO2 99 %.Body mass index is 26.39 kg/m.  General Appearance: Casual  Eye Contact:  Good  Speech:  Normal Rate  Volume:  Normal  Mood:  Euthymic  Affect:  Congruent  Thought Process:  Coherent and Descriptions of Associations: Intact  Orientation:  Full (Time, Place, and Person)  Thought Content:  WDL and  Logical  Suicidal Thoughts:  No  Homicidal Thoughts:  No  Memory:  Immediate;   Good Recent;   Good Remote;   Good  Judgement:  Fair  Insight:  Fair  Psychomotor Activity:  Normal  Concentration:  Concentration: Good and Attention Span: Good  Recall:  Good  Fund of Knowledge:  Good  Language:  Good  Akathisia:  No  Handed:  Right  AIMS (if indicated):     Assets:  Housing Leisure Time Physical Health Resilience Social Support  ADL's:  Intact  Cognition:  WNL  Sleep:      Mental Status Per Nursing Assessment::   On Admission:   bizarre behaviors  Demographic Factors:  Male, Caucasian and Living alone  Loss Factors: NOne  Historical Factors: NA  Risk Reduction Factors:   Sense of responsibility to family, Living with another person, especially a relative, Positive social support and Positive therapeutic relationship  Continued Clinical Symptoms:  None   Cognitive Features That Contribute To Risk:  None    Suicide Risk:  Minimal: No identifiable suicidal ideation.  Patients presenting with no risk factors but with morbid ruminations; may be classified as minimal risk based on the severity of the depressive symptoms    Plan Of Care/Follow-up recommendations:  Activity:  as tolerated Diet:  heart healthy diet  LORD, JAMISON, NP 03/20/2018, 12:20 PM

## 2018-05-24 ENCOUNTER — Ambulatory Visit (HOSPITAL_COMMUNITY)
Admission: EM | Admit: 2018-05-24 | Discharge: 2018-05-24 | Disposition: A | Discharge status: Home / Self Care | Payer: Medicare Other | Attending: Family Medicine | Admitting: Family Medicine

## 2018-05-24 ENCOUNTER — Encounter (HOSPITAL_COMMUNITY): Payer: Self-pay | Admitting: Family Medicine

## 2018-05-24 DIAGNOSIS — B370 Candidal stomatitis: Secondary | ICD-10-CM

## 2018-05-24 DIAGNOSIS — J40 Bronchitis, not specified as acute or chronic: Secondary | ICD-10-CM

## 2018-05-24 MED ORDER — FLUCONAZOLE 150 MG PO TABS: 150.0000 mg | ORAL_TABLET | Freq: Once | ORAL | 0 refills | Status: AC

## 2018-05-24 MED ORDER — AZITHROMYCIN 250 MG PO TABS: 250.0000 mg | ORAL_TABLET | Freq: Every day | ORAL | 0 refills | Status: DC

## 2018-05-24 NOTE — ED Provider Notes (Signed)
[place]    CSN: [id number] Arrival date & time: 05/24/18  1704     History   Chief Complaint Chief Complaint  Patient presents with  . Cough  . Sore Throat  . Headache    HPI [name] is a 48 y.o. male.   This patient presents with chief complaint of cough.  The summary of symptoms is comprised of sore throat, headache, cough, fever and body aches x 5 weeks.     Past Medical History:  Diagnosis Date  . Asthma   . CHF (congestive heart failure) ([place])   . Chronic back pain   . COPD (chronic obstructive pulmonary disease) ([place])   . Knee pain, chronic   . Migraines   . Schizophrenia, schizo-affective [facility])     Patient Active Problem List   Diagnosis Date Noted  . Schizophrenia, paranoid type ([place]) 08/19/2017  . Hypokalemia 04/28/2014  . Drug overdose 04/27/2014  . Schizoaffective disorder, bipolar type ([place]) 03/02/2014  . Anxiety state 03/02/2014  . Benzodiazepine dependence ([place]) 03/02/2014  . Chest pain 10/11/2013  . EKG abnormalities 10/11/2013  . Chronic back pain     Past Surgical History:  Procedure Laterality Date  . extraction of wisdom teeth    . KNEE SURGERY     four times       Home Medications    Prior to Admission medications   Medication Sig Start Date End Date Taking? Authorizing Provider  clonazePAM (KLONOPIN) 0.5 MG tablet Take 0.5 mg by mouth 2 (two) times daily as needed for anxiety.   Yes [provider]  OLANZapine (ZYPREXA) 5 MG tablet Take 5 mg by mouth at bedtime.   Yes [provider]  azithromycin (ZITHROMAX) 250 MG tablet Take 1 tablet (250 mg total) by mouth daily. Take first 2 tablets together, then 1 every day until finished. 05/24/18   Robyn Haber, MD  fluconazole (DIFLUCAN) 150 MG tablet Take 1 tablet (150 mg total) by mouth once for 1 dose. Repeat if needed 05/24/18 05/24/18  Robyn Haber, MD    Family History Family History  Problem Relation Age of Onset  . Hypertension  Father     Social History Social History   Tobacco Use  . Smoking status: Current Every Day Smoker    Packs/day: 2.00    Years: 10.00    Pack years: 20.00    Types: Cigarettes  . Smokeless tobacco: Never Used  Substance Use Topics  . Alcohol use: No    Comment: occ  . Drug use: No     Allergies   Amoxicillin; Dextromethorphan-guaifenesin; Haloperidol; Meloxicam; Nsaids; Prednisone; Quetiapine; Sudafed [pseudoephedrine hcl]; and Ziprasidone   Review of Systems Review of Systems   Physical Exam Triage Vital Signs ED Triage Vitals  Enc Vitals Group     BP      Pulse      Resp      Temp      Temp src      SpO2      Weight      Height      Head Circumference      Peak Flow      Pain Score      Pain Loc      Pain Edu?      Excl. in [place]?    No data found.  Updated Vital Signs BP (!) 141/86   Pulse 84   Temp 99 F (37.2 C)   Resp 18  SpO2 98%    Physical Exam   UC Treatments / Results  Labs (all labs ordered are listed, but only abnormal results are displayed) Labs Reviewed - No data to display  EKG None  Radiology No results found.  Procedures Procedures (including critical care time)  Medications Ordered in UC Medications - No data to display  Initial Impression / Assessment and Plan / UC Course  I have reviewed the triage vital signs and the nursing notes.  Pertinent labs & imaging results that were available during my care of the patient were reviewed by me and considered in my medical decision making (see chart for details).     Final Clinical Impressions(s) / UC Diagnoses   Final diagnoses:  Bronchitis  Oral thrush   Discharge Instructions   None    ED Prescriptions    Medication Sig Dispense Auth. Provider   azithromycin (ZITHROMAX) 250 MG tablet Take 1 tablet (250 mg total) by mouth daily. Take first 2 tablets together, then 1 every day until finished. 6 tablet Robyn Haber, MD   fluconazole (DIFLUCAN) 150 MG tablet  Take 1 tablet (150 mg total) by mouth once for 1 dose. Repeat if needed 2 tablet Robyn Haber, MD     Controlled Substance Prescriptions [place] Controlled Substance Registry consulted? Not Applicable   Robyn Haber, MD 05/24/18 [phone]

## 2018-05-24 NOTE — ED Triage Notes (Signed)
Pt presents with sore throat, headache, cough, fever and body aches x 5 weeks.

## 2018-06-13 ENCOUNTER — Emergency Department (HOSPITAL_COMMUNITY): Payer: Medicare Other

## 2018-06-13 ENCOUNTER — Encounter (HOSPITAL_COMMUNITY): Payer: Self-pay | Admitting: Emergency Medicine

## 2018-06-13 ENCOUNTER — Other Ambulatory Visit: Payer: Self-pay

## 2018-06-13 ENCOUNTER — Inpatient Hospital Stay (HOSPITAL_COMMUNITY)
Admission: EM | Admit: 2018-06-13 | Discharge: 2018-06-16 | DRG: 917 | Disposition: A | Discharge status: Other Acute Inpatient Hospital | Payer: Medicare Other | Attending: Internal Medicine | Admitting: Internal Medicine

## 2018-06-13 DIAGNOSIS — J449 Chronic obstructive pulmonary disease, unspecified: Secondary | ICD-10-CM | POA: Diagnosis present

## 2018-06-13 DIAGNOSIS — Z8249 Family history of ischemic heart disease and other diseases of the circulatory system: Secondary | ICD-10-CM

## 2018-06-13 DIAGNOSIS — F1320 Sedative, hypnotic or anxiolytic dependence, uncomplicated: Secondary | ICD-10-CM | POA: Diagnosis present

## 2018-06-13 DIAGNOSIS — F200 Paranoid schizophrenia: Secondary | ICD-10-CM | POA: Diagnosis present

## 2018-06-13 DIAGNOSIS — T424X2A Poisoning by benzodiazepines, intentional self-harm, initial encounter: Principal | ICD-10-CM | POA: Diagnosis present

## 2018-06-13 DIAGNOSIS — F250 Schizoaffective disorder, bipolar type: Secondary | ICD-10-CM | POA: Diagnosis present

## 2018-06-13 DIAGNOSIS — F17210 Nicotine dependence, cigarettes, uncomplicated: Secondary | ICD-10-CM | POA: Diagnosis present

## 2018-06-13 DIAGNOSIS — J189 Pneumonia, unspecified organism: Secondary | ICD-10-CM

## 2018-06-13 DIAGNOSIS — R9431 Abnormal electrocardiogram [ECG] [EKG]: Secondary | ICD-10-CM | POA: Diagnosis present

## 2018-06-13 DIAGNOSIS — Z915 Personal history of self-harm: Secondary | ICD-10-CM

## 2018-06-13 DIAGNOSIS — G92 Toxic encephalopathy: Secondary | ICD-10-CM | POA: Diagnosis present

## 2018-06-13 DIAGNOSIS — T1491XA Suicide attempt, initial encounter: Secondary | ICD-10-CM | POA: Diagnosis not present

## 2018-06-13 DIAGNOSIS — R400 Somnolence: Secondary | ICD-10-CM | POA: Diagnosis not present

## 2018-06-13 DIAGNOSIS — I509 Heart failure, unspecified: Secondary | ICD-10-CM | POA: Diagnosis present

## 2018-06-13 DIAGNOSIS — R001 Bradycardia, unspecified: Secondary | ICD-10-CM | POA: Diagnosis present

## 2018-06-13 DIAGNOSIS — R402432 Glasgow coma scale score 3-8, at arrival to emergency department: Secondary | ICD-10-CM | POA: Diagnosis present

## 2018-06-13 LAB — ETHANOL: Alcohol, Ethyl (B): <10 mg/dL (ref <10)

## 2018-06-13 LAB — URINALYSIS, ROUTINE W REFLEX MICROSCOPIC
BACTERIA UA: NONE SEEN
BILIRUBIN URINE: NEGATIVE
Glucose, UA: NEGATIVE mg/dL
KETONES UR: NEGATIVE mg/dL
Leukocytes, UA: NEGATIVE
Nitrite: NEGATIVE
PH: 5 (ref 5.0–8.0)
Protein, ur: NEGATIVE mg/dL
SPECIFIC GRAVITY, URINE: 1.023 (ref 1.005–1.030)

## 2018-06-13 LAB — COMPREHENSIVE METABOLIC PANEL
ALT: 19 U/L (ref 0–44)
AST: 25 U/L (ref 15–41)
Albumin: 4.1 g/dL (ref 3.5–5.0)
Alkaline Phosphatase: 54 U/L (ref 38–126)
Anion gap: 6 (ref 5–15)
CHLORIDE: 110 mmol/L (ref 98–111)
CO2: 27 mmol/L (ref 22–32)
CREATININE: 0.95 mg/dL (ref 0.61–1.24)
Calcium: 10.3 mg/dL (ref 8.9–10.3)
GFR calc non Af Amer: >60 mL/min (ref >60)
Glucose, Bld: 92 mg/dL (ref 70–99)
POTASSIUM: 4.3 mmol/L (ref 3.5–5.1)
SODIUM: 143 mmol/L (ref 135–145)
Total Bilirubin: 0.9 mg/dL (ref 0.3–1.2)
Total Protein: 7.1 g/dL (ref 6.5–8.1)

## 2018-06-13 LAB — RAPID URINE DRUG SCREEN, HOSP PERFORMED
AMPHETAMINES: NOT DETECTED
BENZODIAZEPINES: POSITIVE — AB
Barbiturates: NOT DETECTED
COCAINE: NOT DETECTED
OPIATES: NOT DETECTED
Tetrahydrocannabinol: NOT DETECTED

## 2018-06-13 LAB — CBC WITH DIFFERENTIAL/PLATELET
ABS IMMATURE GRANULOCYTES: 0 10*3/uL (ref 0.0–0.1)
Basophils Absolute: 0.1 10*3/uL (ref 0.0–0.1)
Basophils Relative: 1 %
EOS PCT: 2 %
Eosinophils Absolute: 0.1 10*3/uL (ref 0.0–0.7)
HEMATOCRIT: 54.1 % — AB (ref 39.0–52.0)
HEMOGLOBIN: 17.5 g/dL — AB (ref 13.0–17.0)
Immature Granulocytes: 0 %
LYMPHS ABS: 3 10*3/uL (ref 0.7–4.0)
LYMPHS PCT: 34 %
MCH: 29.6 pg (ref 26.0–34.0)
MCHC: 32.3 g/dL (ref 30.0–36.0)
MCV: 91.4 fL (ref 78.0–100.0)
Monocytes Absolute: 0.6 10*3/uL (ref 0.1–1.0)
Monocytes Relative: 7 %
NEUTROS ABS: 4.9 10*3/uL (ref 1.7–7.7)
Neutrophils Relative %: 56 %
Platelets: 214 10*3/uL (ref 150–400)
RBC: 5.92 MIL/uL — ABNORMAL HIGH (ref 4.22–5.81)
RDW: 12.6 % (ref 11.5–15.5)
WBC: 8.7 10*3/uL (ref 4.0–10.5)

## 2018-06-13 LAB — CK: Total CK: 129 U/L (ref 49–397)

## 2018-06-13 LAB — SALICYLATE LEVEL

## 2018-06-13 LAB — MRSA PCR SCREENING: MRSA BY PCR: NEGATIVE

## 2018-06-13 LAB — ACETAMINOPHEN LEVEL: Acetaminophen (Tylenol), Serum: <10 ug/mL — ABNORMAL LOW (ref 10–30)

## 2018-06-13 MED ORDER — SODIUM CHLORIDE 0.9 % IV BOLUS
1000.0000 mL | Freq: Once | INTRAVENOUS | Status: AC
  Administered 2018-06-13: 1000 mL via INTRAVENOUS

## 2018-06-13 MED ORDER — ACETAMINOPHEN 650 MG RE SUPP: 650.0000 mg | Freq: Four times a day (QID) | RECTAL | Status: DC | PRN

## 2018-06-13 MED ORDER — ENOXAPARIN SODIUM 40 MG/0.4ML ~~LOC~~ SOLN
40.0000 mg | SUBCUTANEOUS | Status: DC
  Administered 2018-06-13 – 2018-06-14 (×2): 40 mg via SUBCUTANEOUS
  Filled 2018-06-13 (×4): qty 0.4

## 2018-06-13 MED ORDER — ONDANSETRON HCL 4 MG PO TABS: 4.0000 mg | ORAL_TABLET | Freq: Four times a day (QID) | ORAL | Status: DC | PRN

## 2018-06-13 MED ORDER — NALOXONE HCL 0.4 MG/ML IJ SOLN
0.4000 mg | Freq: Once | INTRAMUSCULAR | Status: AC
  Administered 2018-06-13: 0.4 mg via INTRAVENOUS

## 2018-06-13 MED ORDER — NALOXONE HCL 0.4 MG/ML IJ SOLN
INTRAMUSCULAR | Status: AC
  Filled 2018-06-13: qty 1

## 2018-06-13 MED ORDER — DEXTROSE IN LACTATED RINGERS 5 % IV SOLN
INTRAVENOUS | Status: DC
  Administered 2018-06-13 – 2018-06-15 (×3): via INTRAVENOUS

## 2018-06-13 MED ORDER — ACETAMINOPHEN 325 MG PO TABS
650.0000 mg | ORAL_TABLET | Freq: Four times a day (QID) | ORAL | Status: DC | PRN
  Administered 2018-06-14: 650 mg via ORAL
  Filled 2018-06-13 (×2): qty 2

## 2018-06-13 MED ORDER — SODIUM CHLORIDE 0.9 % IV SOLN
INTRAVENOUS | Status: DC
  Administered 2018-06-13: 07:00:00 via INTRAVENOUS

## 2018-06-13 MED ORDER — ONDANSETRON HCL 4 MG/2ML IJ SOLN: 4.0000 mg | Freq: Four times a day (QID) | INTRAMUSCULAR | Status: DC | PRN

## 2018-06-13 NOTE — ED Notes (Signed)
Pt cleaned and total bed linen change. Condom cath placed. Warm blankets applied.

## 2018-06-13 NOTE — Progress Notes (Signed)
PROGRESS NOTE    [name]  GMW:[id number] DOB: 1971-03-29 DOA: 06/13/2018 PCP: Barbie Banner, MD    Brief Narrative:  47 year old male who presented with a drug overdose as a suicide attempt.  He does have significant past medical history of schizophrenia, bipolar disease and history of prior suicidal attempts.  He was found by his family with a message written on his left hand that he overdosed on clonazepam 110 x 2 mg, and possibly 25 mg of alprazolam.  On a right-hand it was written that he wanted to die.  On his initial physical examination he was lethargic, his blood pressure was 96/73, 109/77, heart rate 54, respiratory rate 13, temperature 97.5, oxygen saturation 100%.  He had moist mucous membranes, his lungs are clear to auscultation bilaterally, heart S1-S2 present rhythmic, the abdomen was soft nontender, no lower extremity edema.  He withdrawal to pain only, his Glasgow Coma Scale was 7.  Sodium 143, potassium 4.3, chloride 110, bicarb 27, glucose 92, BUN less than 5, creatinine 0.95, AST 25, ALT 90, white count 8.7, hemoglobin 17.5, hematocrit 34.1, platelets 214, CK 129.  Urine analysis negative for infection, drug screen positive for benzodiazepines, alcohol less than 10, salicylate less than 7.  Chest x-ray had right rotation with no infiltrates.  His EKG had sinus bradycardia, normal axis, normal intervals.  Patient was admitted to the hospital with working diagnosis of toxic encephalopathy due to benzodiazepine overdose, suicidal attempt.   Assessment & Plan:   Principal Problem:   Benzodiazepine (tranquilizer) overdose, intentional self-harm, initial encounter (HCC) Active Problems:   Schizoaffective disorder, bipolar type (HCC)   Benzodiazepine dependence (HCC)   Schizophrenia, paranoid type (HCC)   1. Acute toxic encephalopathy. Patient opens eyes to his name, continue to be very somnolent, will continue neuro checks and aspiration precautions. Will add clear liquid  diet to resume when patient awake. For now will add IV dextrose and will change fluids to balanced electrolyte solutions.    2. Suicidal attempt. One to One suicidal precautions, will follow with psych recommendations.   3. Bipolar disorder. Holding any sedative, or psychotropic due to acute toxic encephalopathy.   4.    DVT prophylaxis: enoxaparin   Code Status:  full Family Communication: no family at the bedside  Disposition Plan/ discharge barriers: pending clinical improvement and psych evaluation.    Consultants:   Psychiatry   Procedures:    Antimicrobials:       Subjective: Patient continue to be very lethargic, was agitated when arrived to the medical unit, no apparent pain, no nausea or vomiting.   Objective: Vitals:   06/13/18 0730 06/13/18 0745 06/13/18 0815 06/13/18 0917  BP: 108/82 112/81 130/85 120/81  Pulse: (!) 58 (!) 58 60 61  Resp: 16 16 18 14   Temp:    98 F (36.7 C)  TempSrc:    Oral  SpO2: 100% 100% 100% 100%    Intake/Output Summary (Last 24 hours) at 06/13/2018 1022 Last data filed at 06/13/2018 0831 Gross per 24 hour  Intake -  Output 300 ml  Net -300 ml   There were no vitals filed for this visit.  Examination:   General: deconditioned  Neurology: lethargic, opens his eyes to voice (his name), moves all 4 extremities, not verbal.  E ENT: mild pallor, no icterus, oral mucosa dry.  Cardiovascular: No JVD. S1-S2 present, rhythmic, no gallops, rubs, or murmurs. No lower extremity edema. Pulmonary: vesicular breath sounds bilaterally, adequate air movement, no wheezing, rhonchi  or rales. Gastrointestinal. Abdomen with no organomegaly, non tender, no rebound or guarding Skin. No rashes Musculoskeletal: no joint deformities     Data Reviewed: I have personally reviewed following labs and imaging studies  CBC: Recent Labs  Lab 06/13/18 0132  WBC 8.7  NEUTROABS 4.9  HGB 17.5*  HCT 54.1*  MCV 91.4  PLT 214   Basic Metabolic  Panel: Recent Labs  Lab 06/13/18 0132  NA 143  K 4.3  CL 110  CO2 27  GLUCOSE 92  BUN <5*  CREATININE 0.95  CALCIUM 10.3   GFR: CrCl cannot be calculated (Unknown ideal weight.). Liver Function Tests: Recent Labs  Lab 06/13/18 0132  AST 25  ALT 19  ALKPHOS 54  BILITOT 0.9  PROT 7.1  ALBUMIN 4.1   No results for input(s): LIPASE, AMYLASE in the last 168 hours. No results for input(s): AMMONIA in the last 168 hours. Coagulation Profile: No results for input(s): INR, PROTIME in the last 168 hours. Cardiac Enzymes: Recent Labs  Lab 06/13/18 0132  CKTOTAL 129   BNP (last 3 results) No results for input(s): PROBNP in the last 8760 hours. HbA1C: No results for input(s): HGBA1C in the last 72 hours. CBG: No results for input(s): GLUCAP in the last 168 hours. Lipid Profile: No results for input(s): CHOL, HDL, LDLCALC, TRIG, CHOLHDL, LDLDIRECT in the last 72 hours. Thyroid Function Tests: No results for input(s): TSH, T4TOTAL, FREET4, T3FREE, THYROIDAB in the last 72 hours. Anemia Panel: No results for input(s): VITAMINB12, FOLATE, FERRITIN, TIBC, IRON, RETICCTPCT in the last 72 hours.    Radiology Studies: I have reviewed all of the imaging during this hospital visit personally     Scheduled Meds: . enoxaparin (LOVENOX) injection  40 mg Subcutaneous Q24H   Continuous Infusions: . sodium chloride 100 mL/hr at 06/13/18 0639     LOS: 0 days        Carlynn Leduc Annett Gula, MD [facility] Pager [phone]

## 2018-06-13 NOTE — ED Notes (Signed)
NRB removed as well as nasal trumpet per EDP. Pt placed on O2 via [place]

## 2018-06-13 NOTE — ED Notes (Signed)
Pt's mother took wallet home with her

## 2018-06-13 NOTE — ED Notes (Signed)
Attempted to call report

## 2018-06-13 NOTE — ED Notes (Signed)
[name]  [phone] or [phone]

## 2018-06-13 NOTE — ED Notes (Signed)
Pt was wet change sheets .place clean sheets and chuxs under pt .warmblankets .reposition,pt,place a gown on..back on [place]

## 2018-06-13 NOTE — ED Notes (Signed)
Pt sleeping respirations even and non labored. Skin warm and dry. Color of liops is pink. Pt does not rouse to verbal or touch.
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
## 2018-06-13 NOTE — ED Triage Notes (Signed)
Patient from home, wrote on his hand that he overdosed on 110 - 2mg  clonazepam and possibly 25 unknown mg of Valium.  Patient with GCS of 3.  GFD gave 2mg  Narcan with no response.

## 2018-06-13 NOTE — ED Notes (Signed)
Pt rouses with fluttering eyes to loud voice and touch. De[place] not speak and returns to sleep.

## 2018-06-13 NOTE — H&P (Signed)
History and Physical    [name] WUJ:[id number] DOB: [birth year] DOA: 06/13/2018  PCP: Christain Sacramento, MD  Patient coming from: Home  I have personally briefly reviewed patient's old medical records in [place]  Chief Complaint: OD, suicide attempt  HPI: [name] Narang is a 47 y.o. male with medical history significant of Schizophrenia schizoaffective, BPD, prior suicide attempts, on chronic benzos.  Patient found at home by family, had message written on left hand that he had overdosed on 110 x 2mg  clonazepam and possibly 25 unknown mg of what we now know to be alprazolam.  On the opposite side of his hand he writes "Living will: I want to die".  EMS was called, patient brought in to ED.  Narcan given by EMS had no effect.   ED Course: In the ED patient has been persistently lethargic though appears to have stable airway.  Initially responding to loud verbal stimulation, he now only is responding to painful stimuli.   Review of Systems: Unable to perform due to AMS. Past Medical History:  Diagnosis Date  . Asthma   . CHF (congestive heart failure) ([place])   . Chronic back pain   . COPD (chronic obstructive pulmonary disease) ([place])   . Knee pain, chronic   . Migraines   . Schizophrenia, schizo-affective ([place])     Past Surgical History:  Procedure Laterality Date  . extraction of wisdom teeth    . KNEE SURGERY     four times     reports that he has been smoking cigarettes. He has a 20.00 pack-year smoking history. He has never used smokeless tobacco. He reports that he does not drink alcohol or use drugs.  Allergies  Allergen Reactions  . Amoxicillin Diarrhea    Has patient had a PCN reaction causing immediate rash, facial/tongue/throat swelling, SOB or lightheadedness with hypotension: No Has patient had a PCN reaction causing severe rash involving mucus membranes or skin necrosis: No Has patient had a PCN reaction that required hospitalization: No Has patient  had a PCN reaction occurring within the last 10 years: No If all of the above answers are "NO", then may proceed with Cephalosporin use.   Marland Kitchen Dextromethorphan-Guaifenesin Other (See Comments)    unknown  . Haloperidol Other (See Comments)    unspecified  . Meloxicam Other (See Comments)    unspecified  . Nsaids Other (See Comments)    Rectal bleeding  . Prednisone Other (See Comments)    unspecified  . Quetiapine Other (See Comments)    Psychosis with high dose (600mg )  . Sudafed [Pseudoephedrine Hcl] Other (See Comments)    Hallucinations  . Ziprasidone Other (See Comments)    unspecified    Family History  Problem Relation Age of Onset  . Hypertension Father      Prior to Admission medications   Medication Sig Start Date End Date Taking? Authorizing Provider  azithromycin (ZITHROMAX) 250 MG tablet Take 1 tablet (250 mg total) by mouth daily. Take first 2 tablets together, then 1 every day until finished. 05/24/18   Robyn Haber, MD  clonazePAM (KLONOPIN) 0.5 MG tablet Take 0.5 mg by mouth 2 (two) times daily as needed for anxiety.    [provider]  OLANZapine (ZYPREXA) 5 MG tablet Take 5 mg by mouth at bedtime.    [provider]    Physical Exam: Vitals:   06/13/18 0315 06/13/18 0330 06/13/18 0400 06/13/18 0435  BP: 120/84 109/77 96/73 (!) 133/91  Pulse: Marland Kitchen)  58 (!) 57 (!) 57 (!) 54  Resp: 13 13 14 14   Temp:    (!) 97.5 F (36.4 C)  TempSrc:    Oral  SpO2: 100% 100% 100% 100%    Constitutional: Lethargic Eyes: PERRL, lids and conjunctivae normal ENMT: Mucous membranes are moist. Posterior pharynx clear of any exudate or lesions.Normal dentition.  Neck: normal, supple, no masses, no thyromegaly Respiratory: clear to auscultation bilaterally, no wheezing, no crackles. Normal respiratory effort. No accessory muscle use.  Cardiovascular: Regular rate and rhythm, no murmurs / rubs / gallops. No extremity edema. 2+ pedal pulses. No carotid bruits.    Abdomen: no tenderness, no masses palpated. No hepatosplenomegaly. Bowel sounds positive.  Musculoskeletal: no clubbing / cyanosis. No joint deformity upper and lower extremities. Good ROM, no contractures. Normal muscle tone.  Skin: no rashes, lesions, ulcers. No induration Neurologic: MAE, Lethargic, withdraws to pain only with severe noxious stimuli (nipple twisting), sternal rub and nail bed pressure didn't really produce much in the way of results. Psychiatric: GCS 7.  See HPI documenting messages patient has written on his L hand   Labs on Admission: I have personally reviewed following labs and imaging studies  CBC: Recent Labs  Lab 06/13/18 0132  WBC 8.7  NEUTROABS 4.9  HGB 17.5*  HCT 54.1*  MCV 91.4  PLT 676   Basic Metabolic Panel: Recent Labs  Lab 06/13/18 0132  NA 143  K 4.3  CL 110  CO2 27  GLUCOSE 92  BUN <5*  CREATININE 0.95  CALCIUM 10.3   GFR: CrCl cannot be calculated (Unknown ideal weight.). Liver Function Tests: Recent Labs  Lab 06/13/18 0132  AST 25  ALT 19  ALKPHOS 54  BILITOT 0.9  PROT 7.1  ALBUMIN 4.1   No results for input(s): LIPASE, AMYLASE in the last 168 hours. No results for input(s): AMMONIA in the last 168 hours. Coagulation Profile: No results for input(s): INR, PROTIME in the last 168 hours. Cardiac Enzymes: No results for input(s): CKTOTAL, CKMB, CKMBINDEX, TROPONINI in the last 168 hours. BNP (last 3 results) No results for input(s): PROBNP in the last 8760 hours. HbA1C: No results for input(s): HGBA1C in the last 72 hours. CBG: No results for input(s): GLUCAP in the last 168 hours. Lipid Profile: No results for input(s): CHOL, HDL, LDLCALC, TRIG, CHOLHDL, LDLDIRECT in the last 72 hours. Thyroid Function Tests: No results for input(s): TSH, T4TOTAL, FREET4, T3FREE, THYROIDAB in the last 72 hours. Anemia Panel: No results for input(s): VITAMINB12, FOLATE, FERRITIN, TIBC, IRON, RETICCTPCT in the last 72 hours. Urine  analysis:    Component Value Date/Time   COLORURINE YELLOW 06/13/2018 0359   APPEARANCEUR HAZY (A) 06/13/2018 0359   LABSPEC 1.023 06/13/2018 0359   PHURINE 5.0 06/13/2018 0359   GLUCOSEU NEGATIVE 06/13/2018 0359   HGBUR SMALL (A) 06/13/2018 0359   BILIRUBINUR NEGATIVE 06/13/2018 0359   KETONESUR NEGATIVE 06/13/2018 0359   PROTEINUR NEGATIVE 06/13/2018 0359   UROBILINOGEN 0.2 01/22/2011 0350   NITRITE NEGATIVE 06/13/2018 0359   LEUKOCYTESUR NEGATIVE 06/13/2018 0359    Radiological Exams on Admission: Dg Chest Port 1 View  Result Date: 06/13/2018 CLINICAL DATA:  Overdose.  Altered consciousness. EXAM: PORTABLE CHEST 1 VIEW COMPARISON:  March 26, 2016 FINDINGS: The heart size and mediastinal contours are within normal limits. Both lungs are clear. The visualized skeletal structures are unremarkable. IMPRESSION: No active disease. Electronically Signed   By: Dorise Bullion III M.D   On: 06/13/2018 02:05    EKG:  Independently reviewed.  Assessment/Plan Active Problems:   Benzodiazepine (tranquilizer) overdose, intentional self-harm, initial encounter ([place])    1. Benzo OD, suicide attempt - 1. IVF: NS at 100 2. Tele monitor 3. Cont pulse ox 4. ET CO2 monitor 5. If ET CO2 elevates, call PCCM back for intubation, but they didn't feel he needed intubation right now 6. When patient wakes up, will need psychiatry consult. 7. Sitter when he wakes up 8. Consult poison control  DVT prophylaxis: Lovenox Code Status: Full code - clearly we cant honor the "living will" patient wrote on his hand during this suicide attempt. Family Communication: Family at bedside Disposition Plan: likely IP psych after admit Consults called: PCCM, call psych when patient wakes up. Admission status: Admit to inpatient  Severity of Illness: The appropriate patient status for this patient is INPATIENT. Inpatient status is judged to be reasonable and necessary in order to provide the required intensity of  service to ensure the patient's safety. The patient's presenting symptoms, physical exam findings, and initial radiographic and laboratory data in the context of their chronic comorbidities is felt to place them at high risk for further clinical deterioration. Furthermore, it is not anticipated that the patient will be medically stable for discharge from the hospital within 2 midnights of admission. The following factors support the patient status of inpatient.   " The patient's presenting symptoms include Unresponsiveness, suicide attempt by overdose. " The worrisome physical exam findings include GCS of 6, minimally responsive even to very painful stimuli. " The initial radiographic and laboratory data are worrisome because of UDS confirms benzos. " The chronic co-morbidities include schizophrenia, schizoaffective bipolar type, chronic benzo dependence, .   * I certify that at the point of admission it is my clinical judgment that the patient will require inpatient hospital care spanning beyond 2 midnights from the point of admission due to high intensity of service, high risk for further deterioration and high frequency of surveillance required.Etta Quill DO [facility] Pager [phone] Only works nights!  If 7AM-7PM, please contact the primary day team physician taking care of patient  [email or web address] Password [email or web address]  06/13/2018, 5:46 AM

## 2018-06-13 NOTE — ED Notes (Signed)
[name] with poison control recommends continuous monitoring until pt back to baseline. Repeat EKG and CPK order recommended.

## 2018-06-13 NOTE — ED Provider Notes (Signed)
[facility] EMERGENCY DEPARTMENT Provider Note   CSN: [id number] Arrival date & time: 06/13/18  0121     History   Chief Complaint Chief Complaint  Patient presents with  . Drug Overdose    HPI [name] is a 47 y.o. male.  Patient is a 47 year old male with past medical history of schizophrenia, asthma, CHF, and migraines.  He is brought by EMS after being found unresponsive at home.  He had written on his hand that he had overdosed on Klonopin and another medication that I and unable to decipher.  Patient was found to be unresponsive and adds no additional history.       Past Medical History:  Diagnosis Date  . Asthma   . CHF (congestive heart failure) ([place])   . Chronic back pain   . COPD (chronic obstructive pulmonary disease) ([place])   . Knee pain, chronic   . Migraines   . Schizophrenia, schizo-affective [facility])     Patient Active Problem List   Diagnosis Date Noted  . Schizophrenia, paranoid type ([place]) 08/19/2017  . Hypokalemia 04/28/2014  . Drug overdose 04/27/2014  . Schizoaffective disorder, bipolar type ([place]) 03/02/2014  . Anxiety state 03/02/2014  . Benzodiazepine dependence ([place]) 03/02/2014  . Chest pain 10/11/2013  . EKG abnormalities 10/11/2013  . Chronic back pain     Past Surgical History:  Procedure Laterality Date  . extraction of wisdom teeth    . KNEE SURGERY     four times        Home Medications    Prior to Admission medications   Medication Sig Start Date End Date Taking? Authorizing Provider  azithromycin (ZITHROMAX) 250 MG tablet Take 1 tablet (250 mg total) by mouth daily. Take first 2 tablets together, then 1 every day until finished. 05/24/18   Robyn Haber, MD  clonazePAM (KLONOPIN) 0.5 MG tablet Take 0.5 mg by mouth 2 (two) times daily as needed for anxiety.    [provider]  OLANZapine (ZYPREXA) 5 MG tablet Take 5 mg by mouth at bedtime.    [provider]    Family History Family  History  Problem Relation Age of Onset  . Hypertension Father     Social History Social History   Tobacco Use  . Smoking status: Current Every Day Smoker    Packs/day: 2.00    Years: 10.00    Pack years: 20.00    Types: Cigarettes  . Smokeless tobacco: Never Used  Substance Use Topics  . Alcohol use: No    Comment: occ  . Drug use: No     Allergies   Amoxicillin; Dextromethorphan-guaifenesin; Haloperidol; Meloxicam; Nsaids; Prednisone; Quetiapine; Sudafed [pseudoephedrine hcl]; and Ziprasidone   Review of Systems Review of Systems  Unable to perform ROS: Mental status change     Physical Exam Updated Vital Signs There were no vitals taken for this visit.  Physical Exam  Constitutional: He appears well-developed and well-nourished. No distress.  HENT:  Head: Normocephalic and atraumatic.  Mouth/Throat: Oropharynx is clear and moist.  Eyes:  Pupils are constricted and minimally reactive.  Neck: Normal range of motion. Neck supple.  Cardiovascular: Normal rate and regular rhythm. Exam reveals no friction rub.  No murmur heard. Pulmonary/Chest: Effort normal and breath sounds normal. No respiratory distress. He has no wheezes. He has no rales.  Abdominal: Soft. Bowel sounds are normal. He exhibits no distension. There is no tenderness.  Musculoskeletal: Normal range of motion. He exhibits no edema.  Neurological:  Patient is somnolent with decreased GCS.  He does attempt to respond to loud voice, then drifts back into stupor.  He has been seen moving all 4 extremities.  Remainder of exam is otherwise limited secondary to level of consciousness.  Skin: Skin is warm and dry. He is not diaphoretic.  Nursing note and vitals reviewed.    ED Treatments / Results  Labs (all labs ordered are listed, but only abnormal results are displayed) Labs Reviewed  COMPREHENSIVE METABOLIC PANEL  ETHANOL  CBC WITH DIFFERENTIAL/PLATELET  ACETAMINOPHEN LEVEL  SALICYLATE LEVEL    RAPID URINE DRUG SCREEN, HOSP PERFORMED  URINALYSIS, ROUTINE W REFLEX MICROSCOPIC    EKG None  Radiology No results found.  Procedures Procedures (including critical care time)  Medications Ordered in ED Medications  sodium chloride 0.9 % bolus 1,000 mL (has no administration in time range)     Initial Impression / Assessment and Plan / ED Course  I have reviewed the triage vital signs and the nursing notes.  Pertinent labs & imaging results that were available during my care of the patient were reviewed by me and considered in my medical decision making (see chart for details).  Patient was brought here for evaluation after a benzodiazepine overdose.  He is minimally responsive and seems to be becoming less responsive as his ER stay has gone on.  His laboratory studies are otherwise unremarkable.  Patient will be evaluated by the hospitalist for admission.  I have also spoken with critical care who is also evaluated the patient does not feel as though they require intubation or ICU admission.  They will be admitted to the stepdown under the care of Dr. Alcario Drought.  CRITICAL CARE Performed by: Veryl Speak Total critical care time: 35 minutes Critical care time was exclusive of separately billable procedures and treating other patients. Critical care was necessary to treat or prevent imminent or life-threatening deterioration. Critical care was time spent personally by me on the following activities: development of treatment plan with patient and/or surrogate as well as nursing, discussions with consultants, evaluation of patient's response to treatment, examination of patient, obtaining history from patient or surrogate, ordering and performing treatments and interventions, ordering and review of laboratory studies, ordering and review of radiographic studies, pulse oximetry and re-evaluation of patient's condition.   Final Clinical Impressions(s) / ED Diagnoses   Final diagnoses:   None    ED Discharge Orders    None       Veryl Speak, MD 06/13/18 [phone]

## 2018-06-14 ENCOUNTER — Inpatient Hospital Stay (HOSPITAL_COMMUNITY): Payer: Medicare Other

## 2018-06-14 DIAGNOSIS — T1491XA Suicide attempt, initial encounter: Secondary | ICD-10-CM

## 2018-06-14 DIAGNOSIS — T424X2A Poisoning by benzodiazepines, intentional self-harm, initial encounter: Principal | ICD-10-CM

## 2018-06-14 LAB — CBC
HEMATOCRIT: 51 % (ref 39.0–52.0)
Hemoglobin: 16.4 g/dL (ref 13.0–17.0)
MCH: 29.5 pg (ref 26.0–34.0)
MCHC: 32.2 g/dL (ref 30.0–36.0)
MCV: 91.9 fL (ref 78.0–100.0)
Platelets: 190 10*3/uL (ref 150–400)
RBC: 5.55 MIL/uL (ref 4.22–5.81)
RDW: 12.4 % (ref 11.5–15.5)
WBC: 10.8 10*3/uL — ABNORMAL HIGH (ref 4.0–10.5)

## 2018-06-14 LAB — BASIC METABOLIC PANEL
Anion gap: 6 (ref 5–15)
BUN: <5 mg/dL — ABNORMAL LOW (ref 6–20)
CALCIUM: 9.3 mg/dL (ref 8.9–10.3)
CO2: 30 mmol/L (ref 22–32)
CREATININE: 0.95 mg/dL (ref 0.61–1.24)
Chloride: 105 mmol/L (ref 98–111)
GFR calc Af Amer: >60 mL/min (ref >60)
GFR calc non Af Amer: >60 mL/min (ref >60)
GLUCOSE: 102 mg/dL — AB (ref 70–99)
Potassium: 3.3 mmol/L — ABNORMAL LOW (ref 3.5–5.1)
Sodium: 141 mmol/L (ref 135–145)

## 2018-06-14 MED ORDER — OLANZAPINE 5 MG PO TABS
5.0000 mg | ORAL_TABLET | Freq: Every day | ORAL | Status: DC
  Administered 2018-06-15: 5 mg via ORAL
  Filled 2018-06-14 (×2): qty 1

## 2018-06-14 NOTE — Care Management Note (Signed)
Case Management Note  Patient Details  Name: [name] MRN: [id number] Date of Birth: 11/09/1970  Subjective/Objective: From home with family, presents with drug OD, sucide attempt. Psych consulted.                   Action/Plan: NCM will follow for transition of care needs.  Expected Discharge Date:                  Expected Discharge Plan:     In-House Referral:     Discharge planning Services  CM Consult  Post Acute Care Choice:    Choice offered to:     DME Arranged:    DME Agency:     HH Arranged:    HH Agency:     Status of Service:  In process, will continue to follow  If discussed at Long Length of Stay Meetings, dates discussed:    Additional Comments:  Zenon Mayo, RN 06/14/2018, 9:24 AM

## 2018-06-14 NOTE — Consult Note (Addendum)
[facility] Psychiatry Consult   Reason for Consult:  Suicide attempt Referring Physician:  Dr. Thereasa Solo Patient Identification: [name] MRN:  [id number] Principal Diagnosis: Suicide attempt [facility]) Diagnosis:   Patient Active Problem List   Diagnosis Date Noted  . Benzodiazepine (tranquilizer) overdose, intentional self-harm, initial encounter ([place]) [T42.4X2A] 06/13/2018  . Overdose of benzodiazepine, intentional self-harm, initial encounter ([place]) [T42.4X2A]   . Somnolence [R40.0]   . Schizophrenia, paranoid type ([place]) [F20.0] 08/19/2017  . Hypokalemia [E87.6] 04/28/2014  . Drug overdose [T50.901A] 04/27/2014  . Schizoaffective disorder, bipolar type ([place]) [F25.0] 03/02/2014  . Anxiety state [F41.1] 03/02/2014  . Benzodiazepine dependence ([place]) [F13.20] 03/02/2014  . Chest pain [R07.9] 10/11/2013  . EKG abnormalities [R94.31] 10/11/2013  . Chronic back pain [M54.9, G89.29]     Total Time spent with patient: 1 hour  Subjective:   [name] is a 47 y.o. male patient admitted with suicide attempt and toxic encephalopathy.  HPI:   Per chart review, patient was admitted with suicide attempt by benzodiazepine overdose. He overdosed on Klonopin 2 mg tablets (#110) and possible 25 mg of Xanax. He wrote a message on his right hand that he wanted to died and left a message on his left hand on how much medication he ingested. Of note, he was last seen on 7/13 for bizarre behaviors in the setting of substance use. He missed his court date on 7/12 for assaulting his mother since he was IVC'd by her. Home medications include Klonopin 2 mg TID and Zyprexa 5 mg qhs. PMP indicates that he last filled a prescription for Klonopin on 10/2 by Dr. Kathryne Eriksson. He is also prescribed Norco 10-325 mg BID PRN. BAL was negative and UDS was positive for benzodiazepines on admission.   On interview, [name] reports "My life is not possible.  My life is hell."  He reports that he has been dealing with  multiple psychosocial stressors over the past month including financial stressors which led to thoughts of suicide.  He reports that he overdosed on Klonopin and denies ingesting Xanax.  He reports medication compliance.  He denies HI or AVH.  He reports no problems with sleep or appetite.  He becomes increasingly labile throughout the interview due to repeatedly waking him up so a more thorough history was unable to be obtained.  Past Psychiatric History: Bipolar disorder, schizoaffective disorder and borderline personality disorder.   Risk to Self:  Yes given suicide attempt.  Risk to Others:  None. Denies HI.  Prior Inpatient Therapy:  Prior hospitalizations and last hospitalized in 08/2017 for psychosis.  Prior Outpatient Therapy:  He is followed by [facility].   Past Medical History:  Past Medical History:  Diagnosis Date  . Asthma   . CHF (congestive heart failure) ([place])   . Chronic back pain   . COPD (chronic obstructive pulmonary disease) ([place])   . Knee pain, chronic   . Migraines   . Schizophrenia, schizo-affective ([place])     Past Surgical History:  Procedure Laterality Date  . extraction of wisdom teeth    . KNEE SURGERY     four times   Family History:  Family History  Problem Relation Age of Onset  . Hypertension Father    Family Psychiatric  History: None Social History:  Social History   Substance and Sexual Activity  Alcohol Use No   Comment: occ     Social History   Substance and Sexual Activity  Drug Use No  Social History   Socioeconomic History  . Marital status: Single    Spouse name: Not on file  . Number of children: Not on file  . Years of education: Not on file  . Highest education level: Not on file  Occupational History  . Not on file  Social Needs  . Financial resource strain: Not on file  . Food insecurity:    Worry: Not on file    Inability: Not on file  . Transportation needs:    Medical: Not on file     Non-medical: Not on file  Tobacco Use  . Smoking status: Current Every Day Smoker    Packs/day: 2.00    Years: 10.00    Pack years: 20.00    Types: Cigarettes  . Smokeless tobacco: Never Used  Substance and Sexual Activity  . Alcohol use: No    Comment: occ  . Drug use: No  . Sexual activity: Never  Lifestyle  . Physical activity:    Days per week: Not on file    Minutes per session: Not on file  . Stress: Not on file  Relationships  . Social connections:    Talks on phone: Not on file    Gets together: Not on file    Attends religious service: Not on file    Active member of club or organization: Not on file    Attends meetings of clubs or organizations: Not on file    Relationship status: Not on file  Other Topics Concern  . Not on file  Social History Narrative  . Not on file   Additional Social History:    Allergies:   Allergies  Allergen Reactions  . Amoxicillin Diarrhea    Has patient had a PCN reaction causing immediate rash, facial/tongue/throat swelling, SOB or lightheadedness with hypotension: No Has patient had a PCN reaction causing severe rash involving mucus membranes or skin necrosis: No Has patient had a PCN reaction that required hospitalization: No Has patient had a PCN reaction occurring within the last 10 years: No If all of the above answers are "NO", then may proceed with Cephalosporin use.   Marland Kitchen Dextromethorphan-Guaifenesin Other (See Comments)    unknown  . Haloperidol Other (See Comments)    unspecified  . Meloxicam Other (See Comments)    unspecified  . Nsaids Other (See Comments)    Rectal bleeding  . Prednisone Other (See Comments)    unspecified  . Quetiapine Other (See Comments)    Psychosis with high dose (67m)  . Sudafed [Pseudoephedrine Hcl] Other (See Comments)    Hallucinations  . Ziprasidone Other (See Comments)    unspecified    Labs:  Results for orders placed or performed during the hospital encounter of 06/13/18  (from the past 48 hour(s))  Comprehensive metabolic panel     Status: Abnormal   Collection Time: 06/13/18  1:32 AM  Result Value Ref Range   Sodium 143 135 - 145 mmol/L   Potassium 4.3 3.5 - 5.1 mmol/L   Chloride 110 98 - 111 mmol/L   CO2 27 22 - 32 mmol/L   Glucose, Bld 92 70 - 99 mg/dL   BUN <5 (L) 6 - 20 mg/dL   Creatinine, Ser 0.95 0.61 - 1.24 mg/dL   Calcium 10.3 8.9 - 10.3 mg/dL   Total Protein 7.1 6.5 - 8.1 g/dL   Albumin 4.1 3.5 - 5.0 g/dL   AST 25 15 - 41 U/L   ALT 19 0 - 44 U/L  Alkaline Phosphatase 54 38 - 126 U/L   Total Bilirubin 0.9 0.3 - 1.2 mg/dL   GFR calc non Af Amer >60 >60 mL/min   GFR calc Af Amer >60 >60 mL/min    Comment: (NOTE) The eGFR has been calculated using the CKD EPI equation. This calculation has not been validated in all clinical situations. eGFR's persistently <60 mL/min signify possible Chronic Kidney Disease.    Anion gap 6 5 - 15    Comment: Performed at [facility] [address], [place], [place] [address]  Ethanol     Status: None   Collection Time: 06/13/18  1:32 AM  Result Value Ref Range   Alcohol, Ethyl (B) <10 <10 mg/dL    Comment: (NOTE) Lowest detectable limit for serum alcohol is 10 mg/dL. For medical purposes only. Performed at [facility], [place] [address], [place], [place] [address]   CBC with Differential     Status: Abnormal   Collection Time: 06/13/18  1:32 AM  Result Value Ref Range   WBC 8.7 4.0 - 10.5 K/uL   RBC 5.92 (H) 4.22 - 5.81 MIL/uL   Hemoglobin 17.5 (H) 13.0 - 17.0 g/dL   HCT 54.1 (H) 39.0 - 52.0 %   MCV 91.4 78.0 - 100.0 fL   MCH 29.6 26.0 - 34.0 pg   MCHC 32.3 30.0 - 36.0 g/dL   RDW 12.6 11.5 - 15.5 %   Platelets 214 150 - 400 K/uL   Neutrophils Relative % 56 %   Neutro Abs 4.9 1.7 - 7.7 K/uL   Lymphocytes Relative 34 %   Lymphs Abs 3.0 0.7 - 4.0 K/uL   Monocytes Relative 7 %   Monocytes Absolute 0.6 0.1 - 1.0 K/uL   Eosinophils Relative 2 %   Eosinophils Absolute 0.1 0.0 - 0.7  K/uL   Basophils Relative 1 %   Basophils Absolute 0.1 0.0 - 0.1 K/uL   Immature Granulocytes 0 %   Abs Immature Granulocytes 0.0 0.0 - 0.1 K/uL    Comment: Performed at [facility], [address] [place], [place] [address]  Acetaminophen level     Status: Abnormal   Collection Time: 06/13/18  1:32 AM  Result Value Ref Range   Acetaminophen (Tylenol), Serum <10 (L) 10 - 30 ug/mL    Comment: (NOTE) Therapeutic concentrations vary significantly. A range of 10-30 ug/mL  may be an effective concentration for many patients. However, some  are best treated at concentrations outside of this range. Acetaminophen concentrations >150 ug/mL at 4 hours after ingestion  and >50 ug/mL at 12 hours after ingestion are often associated with  toxic reactions. Performed at [facility], [place] [address] [place], [place] [address]   Salicylate level     Status: None   Collection Time: 06/13/18  1:32 AM  Result Value Ref Range   Salicylate Lvl <2.8 2.8 - 30.0 mg/dL    Comment: Performed at [facility] [address] [place], [place] [address]  CK     Status: None   Collection Time: 06/13/18  1:32 AM  Result Value Ref Range   Total CK 129 49 - 397 U/L    Comment: Performed at [facility], [place] [address] [place], [place] [address]  Urine rapid drug screen (hosp performed)     Status: Abnormal   Collection Time: 06/13/18  3:59 AM  Result Value Ref Range   Opiates NONE DETECTED NONE DETECTED  Cocaine NONE DETECTED NONE DETECTED   Benzodiazepines POSITIVE (A) NONE DETECTED   Amphetamines NONE DETECTED NONE DETECTED   Tetrahydrocannabinol NONE DETECTED NONE DETECTED   Barbiturates NONE DETECTED NONE DETECTED    Comment: (NOTE) DRUG SCREEN FOR MEDICAL PURPOSES ONLY.  IF CONFIRMATION IS NEEDED FOR ANY PURPOSE, NOTIFY LAB WITHIN 5 DAYS. LOWEST DETECTABLE LIMITS FOR URINE DRUG SCREEN Drug Class                     Cutoff (ng/mL) Amphetamine and metabolites     1000 Barbiturate and metabolites    200 Benzodiazepine                 030 Tricyclics and metabolites     300 Opiates and metabolites        300 Cocaine and metabolites        300 THC                            50 Performed at [facility], [place] [address] [place], [place] [address]   Urinalysis, Routine w reflex microscopic     Status: Abnormal   Collection Time: 06/13/18  3:59 AM  Result Value Ref Range   Color, Urine YELLOW YELLOW   APPearance HAZY (A) CLEAR   Specific Gravity, Urine 1.023 1.005 - 1.030   pH 5.0 5.0 - 8.0   Glucose, UA NEGATIVE NEGATIVE mg/dL   Hgb urine dipstick SMALL (A) NEGATIVE   Bilirubin Urine NEGATIVE NEGATIVE   Ketones, ur NEGATIVE NEGATIVE mg/dL   Protein, ur NEGATIVE NEGATIVE mg/dL   Nitrite NEGATIVE NEGATIVE   Leukocytes, UA NEGATIVE NEGATIVE   RBC / HPF 0-5 0 - 5 RBC/hpf   WBC, UA 0-5 0 - 5 WBC/hpf   Bacteria, UA NONE SEEN NONE SEEN   Squamous Epithelial / LPF 0-5 0 - 5   Mucus PRESENT     Comment: Performed at [facility], [place] [address] [place], [place] [address]  MRSA PCR Screening     Status: None   Collection Time: 06/13/18  9:03 AM  Result Value Ref Range   MRSA by PCR NEGATIVE NEGATIVE    Comment:        The GeneXpert MRSA Assay (FDA approved for NASAL specimens only), is one component of a comprehensive MRSA colonization surveillance program. It is not intended to diagnose MRSA infection nor to guide or monitor treatment for MRSA infections. Performed at [facility], [place] [address], [place], [place] [address]   CBC     Status: Abnormal   Collection Time: 06/14/18  6:06 AM  Result Value Ref Range   WBC 10.8 (H) 4.0 - 10.5 K/uL   RBC 5.55 4.22 - 5.81 MIL/uL   Hemoglobin 16.4 13.0 - 17.0 g/dL   HCT 51.0 39.0 - 52.0 %   MCV 91.9 78.0 - 100.0 fL   MCH 29.5 26.0 - 34.0 pg   MCHC 32.2 30.0 - 36.0 g/dL   RDW 12.4 11.5 - 15.5 %   Platelets 190 150 - 400 K/uL    Comment: Performed at [facility],  [place] [address], [place], [place] [address]  Basic metabolic panel     Status: Abnormal   Collection Time: 06/14/18  6:06 AM  Result Value Ref Range   Sodium 141 135 - 145 mmol/L   Potassium 3.3 (L) 3.5 - 5.1 mmol/L   Chloride 105  98 - 111 mmol/L   CO2 30 22 - 32 mmol/L   Glucose, Bld 102 (H) 70 - 99 mg/dL   BUN <5 (L) 6 - 20 mg/dL   Creatinine, Ser 0.95 0.61 - 1.24 mg/dL   Calcium 9.3 8.9 - 10.3 mg/dL   GFR calc non Af Amer >60 >60 mL/min   GFR calc Af Amer >60 >60 mL/min    Comment: (NOTE) The eGFR has been calculated using the CKD EPI equation. This calculation has not been validated in all clinical situations. eGFR's persistently <60 mL/min signify possible Chronic Kidney Disease.    Anion gap 6 5 - 15    Comment: Performed at [facility] [address], [place], [place] [address]    Current Facility-Administered Medications  Medication Dose Route Frequency Provider Last Rate Last Dose  . acetaminophen (TYLENOL) tablet 650 mg  650 mg Oral Q6H PRN Etta Quill, DO       Or  . acetaminophen (TYLENOL) suppository 650 mg  650 mg Rectal Q6H PRN Etta Quill, DO      . dextrose 5 % in lactated ringers infusion   Intravenous Continuous Tawni Millers, MD 75 mL/hr at 06/14/18 [phone]    . enoxaparin (LOVENOX) injection 40 mg  40 mg Subcutaneous Q24H Jennette Kettle M, DO   40 mg at 06/14/18 0623  . ondansetron (ZOFRAN) tablet 4 mg  4 mg Oral Q6H PRN Etta Quill, DO       Or  . ondansetron [facility]) injection 4 mg  4 mg Intravenous Q6H PRN Etta Quill, DO        Musculoskeletal: Strength & Muscle Tone: within normal limits Gait & Station: unable to stand Patient leans: N/A  Psychiatric Specialty Exam: Physical Exam  Nursing note and vitals reviewed. Constitutional: He is oriented to person, place, and time. He appears well-developed and well-nourished.  HENT:  Head: Normocephalic and atraumatic.  Neck: Normal range of motion.  Respiratory: Effort  normal.  Musculoskeletal: Normal range of motion.  Neurological: He is alert and oriented to person, place, and time.  Psychiatric: His speech is delayed and slurred. He is agitated. Cognition and memory are normal. He expresses impulsivity. He exhibits a depressed mood. He expresses suicidal ideation. He expresses suicidal plans.    Review of Systems  Gastrointestinal: Positive for abdominal pain. Negative for constipation, diarrhea, nausea and vomiting.  Psychiatric/Behavioral: Positive for depression and suicidal ideas. Negative for hallucinations and substance abuse. The patient does not have insomnia.   All other systems reviewed and are negative.   Blood pressure 119/78, pulse 80, temperature 99 F (37.2 C), temperature source Oral, resp. rate 16, height _0  (1.854 m), weight 90 kg, SpO2 98 %.Body mass index is 26.18 kg/m.  General Appearance: Disheveled, middle aged, Caucasian male who is lying in bed with [place]. NAD.   Eye Contact:  Poor since somnolent and eyes closed.   Speech:  Slow and Slurred  Volume:  Decreased  Mood:  Depressed  Affect:  Labile  Thought Process:  Linear and Descriptions of Associations: Intact  Orientation:  Full (Time, Place, and Person)  Thought Content:  Logical  Suicidal Thoughts:  Yes.  without intent/plan  Homicidal Thoughts:  No  Memory:  Immediate;   Fair Recent;   Fair Remote;   Fair  Judgement:  Fair  Insight:  Fair  Psychomotor Activity:  Decreased  Concentration:  Concentration: Fair and Attention Span: Fair  Recall:  Fair  Fund of Knowledge:  Good  Language:  Good  Akathisia:  No  Handed:  Right  AIMS (if indicated):   N/A  Assets:  Communication Skills Housing  ADL's:  Intact  Cognition:  WNL  Sleep:   Okay   Assessment:  [name] Plath is a 47 y.o. male who was admitted with suicide attempt by Klonopin overdose in the setting of multiple psychosocial stressors. He warrants inpatient psychiatric hospitalization for stabilization  and treatment.   Treatment Plan Summary: -Patient warrants inpatient psychiatric hospitalization given high risk of harm to self. -Continue Engineer, materials.  -Hold home Klonopin. Place patient on CIWA protocol to monitor for withdrawal. May restart Zyprexa once patient is medically stable and more alert. -EKG reviewed and QTc 510 on 10/6. Please closely monitor when starting or increasing QTc prolonging agents.  Recommend repeating EKG. -Please pursue involuntary commitment if patient refuses voluntary psychiatric hospitalization or attempts to leave the hospital.  -Will sign off on patient at this time. Please consult psychiatry again as needed.     Disposition: Recommend psychiatric Inpatient admission when medically cleared.  Faythe Dingwall, DO 06/14/2018 11:43 AM

## 2018-06-14 NOTE — Progress Notes (Signed)
[name] TEAM 1 - Stepdown/ICU TEAM  [name]  UEA:[id number] DOB: 1970/11/22 DOA: 06/13/2018 PCP: Christain Sacramento, MD    Brief Narrative:  47 year old male w/ a history of schizophrenia, bipolar disease, and prior suicide gestures who presented with a drug overdose as a suicide attempt. He was found by his family with a message written on his L hand that he overdosed on clonazepam 110 x 2 mg and possibly 25 mg of alprazolam. On his R hand he had written that he wanted to die.  Subjective: Pt remains sedate at this time. Respirations are not labored and are not suppressed. No evidence of uncontrolled pain. Pt easily awakens to voice but speech is garbled and unintelligible.   Assessment & Plan:  Acute toxic encephalopathy - Benzo overdose  Clinically stabilizing but mental status has not yet recovered - cont supportive care  Suicidal attempt Psych consult requested - suspect inpatient tx will be required once medically cleared   Bipolar disorder Resume usual outpt meds   DVT prophylaxis: lovenox  Code Status: FULL CODE Family Communication: no family present at time of exam  Disposition Plan: SDU until mental status improves  Consultants:  Psychiatry   Antimicrobials:  none  Objective: Blood pressure 119/78, pulse 80, temperature 99 F (37.2 C), temperature source Oral, resp. rate 16, height 6\' 1"  (1.854 m), weight 90 kg, SpO2 98 %.  Intake/Output Summary (Last 24 hours) at 06/14/2018 1154 Last data filed at 06/14/2018 1000 Gross per 24 hour  Intake 525 ml  Output 1025 ml  Net -500 ml   Filed Weights   06/13/18 1115  Weight: 90 kg    Examination: General: No acute respiratory distress - sedate  Lungs: Clear to auscultation bilaterally without wheezes or crackles Cardiovascular: Regular rate and rhythm without murmur gallop or rub normal S1 and S2 Abdomen: Nondistended, soft, bowel sounds positive, no rebound, no ascites, no appreciable mass Extremities: No  significant cyanosis, clubbing, or edema bilateral lower extremities  CBC: Recent Labs  Lab 06/13/18 0132 06/14/18 0606  WBC 8.7 10.8*  NEUTROABS 4.9  --   HGB 17.5* 16.4  HCT 54.1* 51.0  MCV 91.4 91.9  PLT 214 478   Basic Metabolic Panel: Recent Labs  Lab 06/13/18 0132 06/14/18 0606  NA 143 141  K 4.3 3.3*  CL 110 105  CO2 27 30  GLUCOSE 92 102*  BUN <5* <5*  CREATININE 0.95 0.95  CALCIUM 10.3 9.3   GFR: Estimated Creatinine Clearance: 107.5 mL/min (by C-G formula based on SCr of 0.95 mg/dL).  Liver Function Tests: Recent Labs  Lab 06/13/18 0132  AST 25  ALT 19  ALKPHOS 54  BILITOT 0.9  PROT 7.1  ALBUMIN 4.1    Cardiac Enzymes: Recent Labs  Lab 06/13/18 0132  CKTOTAL 129    HbA1C: Hgb A1c MFr Bld  Date/Time Value Ref Range Status  06/25/2017 06:41 AM 5.5 4.8 - 5.6 % Final    Comment:    (NOTE) Pre diabetes:          5.7%-6.4% Diabetes:              >6.4% Glycemic control for   <7.0% adults with diabetes      Recent Results (from the past 240 hour(s))  MRSA PCR Screening     Status: None   Collection Time: 06/13/18  9:03 AM  Result Value Ref Range Status   MRSA by PCR NEGATIVE NEGATIVE Final    Comment:  The GeneXpert MRSA Assay (FDA approved for NASAL specimens only), is one component of a comprehensive MRSA colonization surveillance program. It is not intended to diagnose MRSA infection nor to guide or monitor treatment for MRSA infections. Performed at [facility], [place] [address] [place], [place] [address]      Scheduled Meds: . enoxaparin (LOVENOX) injection  40 mg Subcutaneous Q24H   Continuous Infusions: . dextrose 5% lactated ringers 75 mL/hr at 06/14/18 0629     LOS: 1 day   Cherene Altes, MD [facility] Office  [phone] Pager - Text Page per Shea Evans  If 7PM-7AM, please contact night-coverage per Amion 06/14/2018, 11:54 AM

## 2018-06-15 LAB — COMPREHENSIVE METABOLIC PANEL
ALBUMIN: 3.2 g/dL — AB (ref 3.5–5.0)
ALT: 12 U/L (ref 0–44)
AST: 17 U/L (ref 15–41)
Alkaline Phosphatase: 40 U/L (ref 38–126)
Anion gap: 5 (ref 5–15)
BILIRUBIN TOTAL: 1.2 mg/dL (ref 0.3–1.2)
CHLORIDE: 107 mmol/L (ref 98–111)
CO2: 31 mmol/L (ref 22–32)
Calcium: 9.3 mg/dL (ref 8.9–10.3)
Creatinine, Ser: 0.97 mg/dL (ref 0.61–1.24)
GFR calc Af Amer: >60 mL/min (ref >60)
GFR calc non Af Amer: >60 mL/min (ref >60)
GLUCOSE: 101 mg/dL — AB (ref 70–99)
POTASSIUM: 3.5 mmol/L (ref 3.5–5.1)
Sodium: 143 mmol/L (ref 135–145)
Total Protein: 5.8 g/dL — ABNORMAL LOW (ref 6.5–8.1)

## 2018-06-15 LAB — CBC
HEMATOCRIT: 43.6 % (ref 39.0–52.0)
Hemoglobin: 14.2 g/dL (ref 13.0–17.0)
MCH: 29.6 pg (ref 26.0–34.0)
MCHC: 32.6 g/dL (ref 30.0–36.0)
MCV: 91 fL (ref 80.0–100.0)
Platelets: 185 10*3/uL (ref 150–400)
RBC: 4.79 MIL/uL (ref 4.22–5.81)
RDW: 12.1 % (ref 11.5–15.5)
WBC: 11.9 10*3/uL — ABNORMAL HIGH (ref 4.0–10.5)

## 2018-06-15 LAB — PHOSPHORUS: PHOSPHORUS: 3.6 mg/dL (ref 2.5–4.6)

## 2018-06-15 LAB — MAGNESIUM: Magnesium: 2 mg/dL (ref 1.7–2.4)

## 2018-06-15 MED ORDER — HYDROCODONE-ACETAMINOPHEN 10-325 MG PO TABS
1.0000 | ORAL_TABLET | Freq: Four times a day (QID) | ORAL | Status: DC | PRN
  Administered 2018-06-15 – 2018-06-16 (×2): 2 via ORAL
  Filled 2018-06-15 (×2): qty 2

## 2018-06-15 MED ORDER — HYDROCODONE-ACETAMINOPHEN 10-325 MG PO TABS: 0.5000 | ORAL_TABLET | Freq: Four times a day (QID) | ORAL | Status: DC | PRN

## 2018-06-15 MED ORDER — PROMETHAZINE HCL 25 MG/ML IJ SOLN: 12.5000 mg | Freq: Four times a day (QID) | INTRAMUSCULAR | Status: DC | PRN

## 2018-06-15 MED ORDER — MORPHINE SULFATE (PF) 4 MG/ML IV SOLN: 4.0000 mg | INTRAVENOUS | Status: DC | PRN

## 2018-06-15 MED ORDER — TRAMADOL HCL 50 MG PO TABS: 50.0000 mg | ORAL_TABLET | Freq: Four times a day (QID) | ORAL | Status: DC | PRN

## 2018-06-15 MED ORDER — MORPHINE SULFATE (PF) 4 MG/ML IV SOLN: 4.0000 mg | Freq: Once | INTRAVENOUS | Status: DC

## 2018-06-15 NOTE — Progress Notes (Signed)
Patient placed on involuntary commitment 06/16/2018- expires 06/21/2018. CSW called in referral for Behavior Health ([place] [facility] [place])- CSW was informed no beds are available at this time and will be placed on waiting list. CSW will fax put other referrals for placement.   Thurmond Butts, [place] Social Worker [phone]

## 2018-06-15 NOTE — Progress Notes (Addendum)
[name]  [name]  JOI:[id number] DOB: 08-Apr-1971 DOA: 06/13/2018 PCP: Christain Sacramento, MD    Brief Narrative:  47 year old male w/ a history of schizophrenia, bipolar disease, and prior suicide gestures who presented with a drug overdose as a suicide attempt. He was found by his family with a message written on his L hand that he overdosed on clonazepam 110 x 2 mg and possibly 25 mg of alprazolam. On his R hand he had written that he wanted to die.  Subjective: Pt is much more alert today, but speech remains somewhat garbled. He denies cp, n/v, abdom pain, or sob. He has a single low grade temp elevation yesterday, but is not currently reporting sx to suggest an acute infection. He tells me he hears disturbing voices and that none of the medications he has ever taken have been successful in making them stop. He agrees to placement in [facility] once medically stable.   Assessment & Plan:  Acute toxic encephalopathy - Benzo overdose  Slowly improving - anticipate he will be ready for psych placement as early as 06/16/18  Suicidal attempt Psych has seen him and feels inpatient tx will be required once medically cleared - he should not be allowed to leave AMA, and will need to be committed if he decides not to comply w/ placement   Bipolar disorder Resume usual outpt meds   Recent PNA Further hx revealed a recent outpt diagnosis of PNA, for which he was prescrbed abx that he never took - a f/u CXR yesterday suggested a possible L basilar infiltrate, but clinically the picture is not convincing for a true PNA - follow fever curve - follow clinically - no abx for now   Prolonged QTc QTc 510 at presentation - keep on tele for now - repeat EKG to re-assess  DVT prophylaxis: lovenox  Code Status: FULL CODE Family Communication: no family present at time of exam  Disposition Plan: stable for tele bed - anticipate fitness for Psych dispo 06/16/18  Consultants:    Psychiatry   Antimicrobials:  none  Objective: Blood pressure 109/75, pulse 65, temperature 98 F (36.7 C), resp. rate 19, height 6\' 1"  (1.854 m), weight 90 kg, SpO2 98 %.  Intake/Output Summary (Last 24 hours) at 06/15/2018 0842 Last data filed at 06/14/2018 1915 Gross per 24 hour  Intake 555 ml  Output 1450 ml  Net -895 ml   Filed Weights   06/13/18 1115  Weight: 90 kg    Examination: General: No acute respiratory distress - more awake - speech still somewhat garbled Lungs: very mild bibasilar crackles  Cardiovascular: Regular rate and rhythm without murmur  Abdomen: Nondistended, soft, bowel sounds positive, no rebound, no ascites, no appreciable mass Extremities: No C/C/E B LE   CBC: Recent Labs  Lab 06/13/18 0132 06/14/18 0606 06/15/18 0201  WBC 8.7 10.8* 11.9*  NEUTROABS 4.9  --   --   HGB 17.5* 16.4 14.2  HCT 54.1* 51.0 43.6  MCV 91.4 91.9 91.0  PLT 214 190 470   Basic Metabolic Panel: Recent Labs  Lab 06/13/18 0132 06/14/18 0606 06/15/18 0201  NA 143 141 143  K 4.3 3.3* 3.5  CL 110 105 107  CO2 27 30 31   GLUCOSE 92 102* 101*  BUN <5* <5* <5*  CREATININE 0.95 0.95 0.97  CALCIUM 10.3 9.3 9.3  MG  --   --  2.0  PHOS  --   --  3.6  GFR: Estimated Creatinine Clearance: 105.3 mL/min (by C-G formula based on SCr of 0.97 mg/dL).  Liver Function Tests: Recent Labs  Lab 06/13/18 0132 06/15/18 0201  AST 25 17  ALT 19 12  ALKPHOS 54 40  BILITOT 0.9 1.2  PROT 7.1 5.8*  ALBUMIN 4.1 3.2*    Cardiac Enzymes: Recent Labs  Lab 06/13/18 0132  CKTOTAL 129    HbA1C: Hgb A1c MFr Bld  Date/Time Value Ref Range Status  06/25/2017 06:41 AM 5.5 4.8 - 5.6 % Final    Comment:    (NOTE) Pre diabetes:          5.7%-6.4% Diabetes:              >6.4% Glycemic control for   <7.0% adults with diabetes      Recent Results (from the past 240 hour(s))  MRSA PCR Screening     Status: None   Collection Time: 06/13/18  9:03 AM  Result Value Ref Range  Status   MRSA by PCR NEGATIVE NEGATIVE Final    Comment:        The GeneXpert MRSA Assay (FDA approved for NASAL specimens only), is one component of a comprehensive MRSA colonization surveillance program. It is not intended to diagnose MRSA infection nor to guide or monitor treatment for MRSA infections. Performed at [facility], [place] [address], [place], [place] [address]      Scheduled Meds: . enoxaparin (LOVENOX) injection  40 mg Subcutaneous Q24H  . OLANZapine  5 mg Oral QHS     LOS: 2 days   Cherene Altes, MD [facility] Office  [phone] Pager - Text Page per Amion  If 7PM-7AM, please contact night-coverage per Amion 06/15/2018, 8:42 AM

## 2018-06-15 NOTE — Care Management Note (Signed)
Case Management Note  Patient Details  Name: [name] MRN: [id number] Date of Birth: 14-Apr-1971  Subjective/Objective:   From home with family, presents with drug OD, sucide attempt. Psych consulted.  Psych following, rec inpatient psych.                                  Action/Plan: CSW following for psych placement.  Expected Discharge Date:                  Expected Discharge Plan:  Psychiatric Hospital  In-House Referral:  Clinical Social Work  Discharge planning Services  CM Consult  Post Acute Care Choice:    Choice offered to:     DME Arranged:    DME Agency:     HH Arranged:    [place] Agency:     Status of Service:  In process, will continue to follow  If discussed at Long Length of Stay Meetings, dates discussed:    Additional Comments:  Zenon Mayo, RN 06/15/2018, 3:00 PM

## 2018-06-16 MED ORDER — ONDANSETRON HCL 4 MG/2ML IJ SOLN: 4.0000 mg | Freq: Four times a day (QID) | INTRAMUSCULAR | Status: DC | PRN

## 2018-06-16 MED ORDER — INFLUENZA VAC SPLIT QUAD 0.5 ML IM SUSY
0.5000 mL | PREFILLED_SYRINGE | INTRAMUSCULAR | Status: DC
  Filled 2018-06-16: qty 0.5

## 2018-06-16 MED ORDER — HYDROCODONE-ACETAMINOPHEN 10-325 MG PO TABS: 0.5000 | ORAL_TABLET | Freq: Four times a day (QID) | ORAL | Status: DC | PRN

## 2018-06-16 NOTE — Progress Notes (Signed)
Pt will DC to: [facility] date: 06/16/18 Family notified: Mardene Celeste (mother) Transport by: Brandon Melnick   RN, patient, and facility notified of DC. Discharge Summary sent to facility. RN given number for report  [phone], [employer] (attending Dr. Bethanne Ginger). DC packet on chart. Sheriff  transport requested for patient. Patient involuntary committed on 06/15/18.  CSW signing off.  Thurmond Butts, [place] Social Worker [phone]

## 2018-06-16 NOTE — Care Management Important Message (Signed)
Important Message  Patient Details  Name: [name] MRN: [id number] Date of Birth: May 01, 1971   Medicare Important Message Given:  Yes    Doshia Dalia 06/16/2018, 1:08 PM

## 2018-06-16 NOTE — Progress Notes (Signed)
Report given to [place] at [facility].

## 2018-06-16 NOTE — Discharge Summary (Signed)
[facility] Discharge Summary   Patient: [name] OBS:[id number]   PCP: Christain Sacramento, MD DOB: 03/01/1971   Date of admission: 06/13/2018   Date of discharge:  06/16/2018    Discharge Diagnoses:  Principal Problem:   Suicide attempt [facility]) Active Problems:   Schizoaffective disorder, bipolar type ([place])   Benzodiazepine dependence ([place])   Schizophrenia, paranoid type ([place])   Benzodiazepine (tranquilizer) overdose, intentional self-harm, initial encounter [facility])   Admitted From: home Disposition:  [facility]  Recommendations for Outpatient Follow-up:  1. Please follow up with PCP in 1 week  Follow-up Information    Christain Sacramento, MD. Schedule an appointment as soon as possible for a visit in 1 week(s).   Specialty:  Family Medicine Contact information: [address] [place] [address] [phone]          Diet recommendation: regular diet  Activity: The patient is advised to gradually reintroduce usual activities.  Discharge Condition: good  Code Status: full code  History of present illness: As per the H and P dictated on admission, " [name] Rathbone is a 47 y.o. male with medical history significant of Schizophrenia schizoaffective, BPD, prior suicide attempts, on chronic benzos.  Patient found at home by family, had message written on left hand that he had overdosed on 110 x 2mg  clonazepam and possibly 25 unknown mg of what we now know to be alprazolam.  On the opposite side of his hand he writes "Living will: I want to die".  EMS was called, patient brought in to ED.  Narcan given by EMS had no effect."  Hospital Course:  Summary of his active problems in the hospital is as following. Acute toxic encephalopathy - Benzo overdose  Slowly improving  AAOx4, eating and walking okay.   Suicidal attempt Psych has seen him and feels inpatient tx will be required  Medically cleared for admission.    Bipolar disorder Resume usual outpt meds   Recent  PNA Further hx revealed a recent outpt diagnosis of PNA, for which he was prescrbed abx that he never took - a f/u CXR yesterday suggested a possible L basilar infiltrate, but clinically the picture is not convincing for a true PNA  - follow fever curve  - follow clinically  - no abx for now   Prolonged QTc resolved  QTc 510 at presentation  - repeat EKG QTc 425  Multiple somatic complains Pt C/o having head surgery in past, having cyst in the back, having injury to right leg 6 months ago and left leg 1 month ago as well as concerned for having bronchitis. He wants pan-CT or MRI.  Clinical examination of the concerned areas does not raise any suspicion for any active pathology.  Recommended outpatient follow up with PCP.   All other chronic medical condition were stable during the hospitalization.  Patient was ambulatory without any assistance. On the day of the discharge the patient's vitals were stable , and no other acute medical condition were reported by patient. the patient was felt safe to be discharge at [facility] with pschiatry.  Consultants: psychiatry  Procedures: none  DISCHARGE MEDICATION: Allergies as of 06/16/2018      Reactions   Amoxicillin Diarrhea   Has patient had a PCN reaction causing immediate rash, facial/tongue/throat swelling, SOB or lightheadedness with hypotension: No Has patient had a PCN reaction causing severe rash involving mucus membranes or skin necrosis: No Has patient had a PCN reaction that required hospitalization: No Has  patient had a PCN reaction occurring within the last 10 years: No If all of the above answers are "NO", then may proceed with Cephalosporin use.   Dextromethorphan-guaifenesin Other (See Comments)   unknown   Haloperidol Other (See Comments)   unspecified   Meloxicam Other (See Comments)   unspecified   Nsaids Other (See Comments)   Rectal bleeding   Prednisone Other (See Comments)   unspecified   Quetiapine Other (See  Comments)   Psychosis with high dose (600mg )   Sudafed [pseudoephedrine Hcl] Other (See Comments)   Hallucinations   Ziprasidone Other (See Comments)   unspecified      Medication List    STOP taking these medications   clonazePAM 2 MG tablet Commonly known as:  KLONOPIN     TAKE these medications   NORCO 10-325 MG tablet Generic drug:  HYDROcodone-acetaminophen Take 0.5-1 tablets by mouth 2 (two) times daily as needed for pain.   OLANZapine 5 MG tablet Commonly known as:  ZYPREXA Take 5 mg by mouth at bedtime.      Allergies  Allergen Reactions  . Amoxicillin Diarrhea    Has patient had a PCN reaction causing immediate rash, facial/tongue/throat swelling, SOB or lightheadedness with hypotension: No Has patient had a PCN reaction causing severe rash involving mucus membranes or skin necrosis: No Has patient had a PCN reaction that required hospitalization: No Has patient had a PCN reaction occurring within the last 10 years: No If all of the above answers are "NO", then may proceed with Cephalosporin use.   Marland Kitchen Dextromethorphan-Guaifenesin Other (See Comments)    unknown  . Haloperidol Other (See Comments)    unspecified  . Meloxicam Other (See Comments)    unspecified  . Nsaids Other (See Comments)    Rectal bleeding  . Prednisone Other (See Comments)    unspecified  . Quetiapine Other (See Comments)    Psychosis with high dose (600mg )  . Sudafed [Pseudoephedrine Hcl] Other (See Comments)    Hallucinations  . Ziprasidone Other (See Comments)    unspecified   Discharge Instructions    Diet - low sodium heart healthy   Complete by:  As directed    Increase activity slowly   Complete by:  As directed      Discharge Exam: Filed Weights   06/13/18 1115  Weight: 90 kg   Vitals:   06/15/18 1602 06/16/18 0802  BP: 122/81 118/87  Pulse: 72 (!) 57  Resp: 18   Temp: 98.1 F (36.7 C) 97.7 F (36.5 C)  SpO2:  95%   General: Appear in no distress, no Rash;  Oral Mucosa moist. Cardiovascular: S1 and S2 Present, no Murmur, no JVD Respiratory: Bilateral Air entry present and Clear to Auscultation, no Crackles, no wheezes Abdomen: Bowel Sound present, Soft and no tenderness Extremities: non Pedal edema, no calf tenderness Neurology: Grossly no focal neuro deficit.  The results of significant diagnostics from this hospitalization (including imaging, microbiology, ancillary and laboratory) are listed below for reference.    Significant Diagnostic Studies: Dg Chest Port 1 View  Result Date: 06/14/2018 CLINICAL DATA:  Pneumonia EXAM: PORTABLE CHEST 1 VIEW COMPARISON:  None. FINDINGS: Left basilar consolidation and atelectasis consistent with pneumonia. Heart size is top-normal. Nonaneurysmal thoracic aorta. No pulmonary edema, effusion or pneumothorax. No acute osseous abnormality. IMPRESSION: Left basilar consolidation suspicious for pneumonia superimposed upon atelectatic change. Electronically Signed   By: Ashley Royalty M.D.   On: 06/14/2018 21:35   Dg Chest [facility] [address]  Result Date: 06/13/2018 CLINICAL DATA:  Overdose.  Altered consciousness. EXAM: PORTABLE CHEST 1 VIEW COMPARISON:  March 26, 2016 FINDINGS: The heart size and mediastinal contours are within normal limits. Both lungs are clear. The visualized skeletal structures are unremarkable. IMPRESSION: No active disease. Electronically Signed   By: Dorise Bullion III M.D   On: 06/13/2018 02:05    Microbiology: Recent Results (from the past 240 hour(s))  MRSA PCR Screening     Status: None   Collection Time: 06/13/18  9:03 AM  Result Value Ref Range Status   MRSA by PCR NEGATIVE NEGATIVE Final    Comment:        The GeneXpert MRSA Assay (FDA approved for NASAL specimens only), is one component of a comprehensive MRSA colonization surveillance program. It is not intended to diagnose MRSA infection nor to guide or monitor treatment for MRSA infections. Performed at [facility], [place] [address], [place], [place] [address]      Labs: CBC: Recent Labs  Lab 06/13/18 0132 06/14/18 0606 06/15/18 0201  WBC 8.7 10.8* 11.9*  NEUTROABS 4.9  --   --   HGB 17.5* 16.4 14.2  HCT 54.1* 51.0 43.6  MCV 91.4 91.9 91.0  PLT 214 190 754   Basic Metabolic Panel: Recent Labs  Lab 06/13/18 0132 06/14/18 0606 06/15/18 0201  NA 143 141 143  K 4.3 3.3* 3.5  CL 110 105 107  CO2 27 30 31   GLUCOSE 92 102* 101*  BUN <5* <5* <5*  CREATININE 0.95 0.95 0.97  CALCIUM 10.3 9.3 9.3  MG  --   --  2.0  PHOS  --   --  3.6   Liver Function Tests: Recent Labs  Lab 06/13/18 0132 06/15/18 0201  AST 25 17  ALT 19 12  ALKPHOS 54 40  BILITOT 0.9 1.2  PROT 7.1 5.8*  ALBUMIN 4.1 3.2*   No results for input(s): LIPASE, AMYLASE in the last 168 hours. No results for input(s): AMMONIA in the last 168 hours. Cardiac Enzymes: Recent Labs  Lab 06/13/18 0132  CKTOTAL 129   BNP (last 3 results) No results for input(s): BNP in the last 8760 hours. CBG: No results for input(s): GLUCAP in the last 168 hours. Time spent: 35 minutes  Signed:  Berle Mull  [facility]  06/16/2018  , 12:59 PM

## 2018-06-25 ENCOUNTER — Encounter (HOSPITAL_COMMUNITY): Payer: Self-pay | Admitting: *Deleted

## 2018-06-25 ENCOUNTER — Emergency Department (HOSPITAL_COMMUNITY)
Admission: EM | Admit: 2018-06-25 | Discharge: 2018-06-27 | Disposition: A | Discharge status: Home / Self Care | Payer: Medicare Other | Attending: Emergency Medicine | Admitting: Emergency Medicine

## 2018-06-25 ENCOUNTER — Other Ambulatory Visit: Payer: Self-pay

## 2018-06-25 DIAGNOSIS — Z79899 Other long term (current) drug therapy: Secondary | ICD-10-CM | POA: Insufficient documentation

## 2018-06-25 DIAGNOSIS — J449 Chronic obstructive pulmonary disease, unspecified: Secondary | ICD-10-CM | POA: Insufficient documentation

## 2018-06-25 DIAGNOSIS — F250 Schizoaffective disorder, bipolar type: Secondary | ICD-10-CM | POA: Insufficient documentation

## 2018-06-25 DIAGNOSIS — F17210 Nicotine dependence, cigarettes, uncomplicated: Secondary | ICD-10-CM | POA: Insufficient documentation

## 2018-06-25 DIAGNOSIS — J45909 Unspecified asthma, uncomplicated: Secondary | ICD-10-CM | POA: Insufficient documentation

## 2018-06-25 DIAGNOSIS — F419 Anxiety disorder, unspecified: Secondary | ICD-10-CM | POA: Diagnosis not present

## 2018-06-25 DIAGNOSIS — Z915 Personal history of self-harm: Secondary | ICD-10-CM | POA: Diagnosis not present

## 2018-06-25 DIAGNOSIS — F200 Paranoid schizophrenia: Secondary | ICD-10-CM | POA: Diagnosis present

## 2018-06-25 DIAGNOSIS — I509 Heart failure, unspecified: Secondary | ICD-10-CM | POA: Diagnosis not present

## 2018-06-25 DIAGNOSIS — G4700 Insomnia, unspecified: Secondary | ICD-10-CM | POA: Diagnosis present

## 2018-06-25 LAB — RAPID URINE DRUG SCREEN, HOSP PERFORMED
Amphetamines: NOT DETECTED
BARBITURATES: NOT DETECTED
BENZODIAZEPINES: NOT DETECTED
COCAINE: NOT DETECTED
Opiates: POSITIVE — AB
Tetrahydrocannabinol: NOT DETECTED

## 2018-06-25 LAB — COMPREHENSIVE METABOLIC PANEL
ALBUMIN: 4.2 g/dL (ref 3.5–5.0)
ALT: 22 U/L (ref 0–44)
ANION GAP: 10 (ref 5–15)
AST: 24 U/L (ref 15–41)
Alkaline Phosphatase: 53 U/L (ref 38–126)
BUN: 6 mg/dL (ref 6–20)
CALCIUM: 9.8 mg/dL (ref 8.9–10.3)
CHLORIDE: 106 mmol/L (ref 98–111)
CO2: 24 mmol/L (ref 22–32)
Creatinine, Ser: 0.83 mg/dL (ref 0.61–1.24)
GFR calc non Af Amer: >60 mL/min (ref >60)
Glucose, Bld: 143 mg/dL — ABNORMAL HIGH (ref 70–99)
POTASSIUM: 3.5 mmol/L (ref 3.5–5.1)
Sodium: 140 mmol/L (ref 135–145)
Total Bilirubin: 0.7 mg/dL (ref 0.3–1.2)
Total Protein: 7.4 g/dL (ref 6.5–8.1)

## 2018-06-25 LAB — CBC
HEMATOCRIT: 48.2 % (ref 39.0–52.0)
HEMOGLOBIN: 15.8 g/dL (ref 13.0–17.0)
MCH: 29 pg (ref 26.0–34.0)
MCHC: 32.8 g/dL (ref 30.0–36.0)
MCV: 88.6 fL (ref 80.0–100.0)
NRBC: 0 % (ref 0.0–0.2)
Platelets: 208 10*3/uL (ref 150–400)
RBC: 5.44 MIL/uL (ref 4.22–5.81)
RDW: 12.3 % (ref 11.5–15.5)
WBC: 8.2 10*3/uL (ref 4.0–10.5)

## 2018-06-25 LAB — ETHANOL: Alcohol, Ethyl (B): <10 mg/dL (ref <10)

## 2018-06-25 MED ORDER — OLANZAPINE 5 MG PO TABS
5.0000 mg | ORAL_TABLET | Freq: Every day | ORAL | Status: DC
  Administered 2018-06-25 – 2018-06-26 (×2): 5 mg via ORAL
  Filled 2018-06-25 (×2): qty 1

## 2018-06-25 MED ORDER — DOXEPIN HCL 50 MG PO CAPS
50.0000 mg | ORAL_CAPSULE | Freq: Every day | ORAL | Status: DC
  Administered 2018-06-25 – 2018-06-26 (×2): 50 mg via ORAL
  Filled 2018-06-25 (×2): qty 1

## 2018-06-25 MED ORDER — OLANZAPINE 5 MG PO TABS
2.5000 mg | ORAL_TABLET | Freq: Once | ORAL | Status: AC
  Administered 2018-06-25: 2.5 mg via ORAL
  Filled 2018-06-25: qty 1

## 2018-06-25 NOTE — ED Notes (Signed)
TTS in process 

## 2018-06-25 NOTE — ED Notes (Signed)
Pt pacing back and forth. This emt asked Pt to go back into his room.

## 2018-06-25 NOTE — ED Notes (Signed)
Pt mumbling under his breath, and laughing hysterically.

## 2018-06-25 NOTE — ED Notes (Signed)
Pt. Denies SI/HI. Pt. Reports overdosing 14 days ago. Pt. Speaking in rapid sentences.

## 2018-06-25 NOTE — ED Triage Notes (Signed)
The pt arrived by [facility] from home ambulatory.  He reports that he ran out of meds and has not slept for  4 days.  He has anxiety and he feels like ativan works better for him  He is talking to people in the room that are not present

## 2018-06-25 NOTE — ED Notes (Signed)
Notified [facility] for additional TTS for worsening hallucinations by pt. Pt is pacing in hall looking at floor, mumbling under his breath. Speaking to things that are not there and turning the lights in the room off and on and opening and closing the door looking around it. Pt is easily redirected to his room.

## 2018-06-25 NOTE — ED Provider Notes (Signed)
[facility] EMERGENCY DEPARTMENT Provider Note   CSN: [id number] Arrival date & time: 06/25/18  0155     History   Chief Complaint Chief Complaint  Patient presents with  . Psychiatric Evaluation    HPI [name] Enns is a 47 y.o. male.  Patient with a history of COPD, schizophrenia, migraine, CHF, recent overdose presents with complaint of insomnia for the past 3 days. He reports he usually takes doxepin at bedtime but has been taking 2 instead of the prescribed 1 tablet without relief. He does not feel he has slept in the last 4 days. He denies hallucinations but is observed to be speaking when no one is present with him. He denies SI/HI, self injury.   The history is provided by the patient and the EMS personnel. No language interpreter was used.    Past Medical History:  Diagnosis Date  . Asthma   . CHF (congestive heart failure) ([place])   . Chronic back pain   . COPD (chronic obstructive pulmonary disease) ([place])   . Knee pain, chronic   . Migraines   . Schizophrenia, schizo-affective [facility])     Patient Active Problem List   Diagnosis Date Noted  . Suicide attempt ([place])   . Benzodiazepine (tranquilizer) overdose, intentional self-harm, initial encounter ([place]) 06/13/2018  . Overdose of benzodiazepine, intentional self-harm, initial encounter ([place])   . Somnolence   . Schizophrenia, paranoid type ([place]) 08/19/2017  . Hypokalemia 04/28/2014  . Drug overdose 04/27/2014  . Schizoaffective disorder, bipolar type ([place]) 03/02/2014  . Anxiety state 03/02/2014  . Benzodiazepine dependence ([place]) 03/02/2014  . Chest pain 10/11/2013  . EKG abnormalities 10/11/2013  . Chronic back pain     Past Surgical History:  Procedure Laterality Date  . extraction of wisdom teeth    . KNEE SURGERY     four times        Home Medications    Prior to Admission medications   Medication Sig Start Date End Date Taking? Authorizing Provider  doxepin (SINEQUAN) 50 MG  capsule Take 50 mg by mouth at bedtime. 06/22/18  Yes [provider]  NORCO 10-325 MG tablet Take 0.5-1 tablets by mouth 2 (two) times daily as needed for pain. 05/20/18  Yes [provider]  OLANZapine (ZYPREXA) 5 MG tablet Take 5 mg by mouth at bedtime.   Yes [provider]    Family History Family History  Problem Relation Age of Onset  . Hypertension Father     Social History Social History   Tobacco Use  . Smoking status: Current Every Day Smoker    Packs/day: 2.00    Years: 10.00    Pack years: 20.00    Types: Cigarettes  . Smokeless tobacco: Never Used  Substance Use Topics  . Alcohol use: No    Comment: occ  . Drug use: No     Allergies   Amoxicillin; Dextromethorphan-guaifenesin; Haloperidol; Meloxicam; Nsaids; Prednisone; Quetiapine; Sudafed [pseudoephedrine hcl]; and Ziprasidone   Review of Systems Review of Systems  Constitutional: Negative for chills and fever.  HENT: Negative.   Respiratory: Negative.   Cardiovascular: Negative.   Gastrointestinal: Negative.   Musculoskeletal: Negative.   Skin: Negative.   Neurological: Negative.   Psychiatric/Behavioral: Positive for sleep disturbance. Negative for hallucinations (See HPI.), self-injury and suicidal ideas.     Physical Exam Updated Vital Signs BP (!) 134/108   Pulse 88   Temp 98.4 F (36.9 C)   Resp 18   Ht 6'  1" (1.854 m)   Wt 89.8 kg   SpO2 98%   BMI 26.12 kg/m   Physical Exam  Constitutional: He is oriented to person, place, and time. He appears well-developed and well-nourished.  Neck: Normal range of motion.  Pulmonary/Chest: Effort normal.  Musculoskeletal: Normal range of motion.  Neurological: He is alert and oriented to person, place, and time.  Skin: Skin is warm and dry.  Psychiatric: His affect is blunt. His speech is rapid and/or pressured. He expresses no homicidal and no suicidal ideation.     ED Treatments / Results  Labs (all labs  ordered are listed, but only abnormal results are displayed) Labs Reviewed  COMPREHENSIVE METABOLIC PANEL - Abnormal; Notable for the following components:      Result Value   Glucose, Bld 143 (*)    All other components within normal limits  ETHANOL  CBC  RAPID URINE DRUG SCREEN, HOSP PERFORMED    EKG None  Radiology No results found.  Procedures Procedures (including critical care time)  Medications Ordered in ED Medications - No data to display   Initial Impression / Assessment and Plan / ED Course  I have reviewed the triage vital signs and the nursing notes.  Pertinent labs & imaging results that were available during my care of the patient were reviewed by me and considered in my medical decision making (see chart for details).     Patient with history of substance abuse, recent overdose, schizo-affective schizophrenia presents with insomnia. He states he has used his prescribed medication more than as directed but has been without sleep for the past 4 days. He denies hallucinations, SI, HI.   The patient is felt to possibly be hallucinating, speaking while no one is present. TTS consultation requested to determine patient stability for outpatient follow up vs inpatient treatment.   Per TTS consultation notes, the patient reported he was hallucinating, hearing voices and seeing spirits. No HI/SI. He is recommended for inpatient treatment.   Final Clinical Impressions(s) / ED Diagnoses   Final diagnoses:  None   1. Insomnia 2. AV Hallucinations  ED Discharge Orders    None       Charlann Lange, PA-C 06/28/18 8329    Veryl Speak, MD 06/28/18 [phone]

## 2018-06-25 NOTE — BH Assessment (Signed)
Tele Assessment Note   Patient Name: [name] MRN: [id number] Referring Physician: Dr. Stark Jock Location of Patient: [facility]  [name] Sellers is an 47 y.o., widowed male. Pt presented to [facility] voluntarily and alone due to complaints of not having his mental health medications in 4 days. Pt reports that he was recently discharged from [facility], which he was there for a overdose suicide attempt. Pt denies current SI/HI. Pt is actively responding to stimuli during assessment. Pt stated that he is hearing voices, seeing spirits and other objects that are not present. Pt stated that he has a history of suicide attempts, about 107 in his lifetime. Pt stated, "I just need to see a psychiatrist for my medication and then I can go home." Pt denied current substance use. Pt reports that he went to [facility] for MM services today, but has not yet received services. Pt denied having a current therapist. PT denied depression and anxiety symptoms currently. Pt reported no appetite and sleeping 8-10 hours.   Pt reports that he lives alone. Pt reports receiving SSI. Pt reports being widowed. Pt reports that his father, brother and sister are supportive. Pt reports having pending court dates and legal charges, but refused to discuss what the charges are related to.   Pt oriented to person, place, time and situation. Pt presented alert, dressed appropriately in scrubs and groomed. Pt spoke in a slow, barely audible tone. Pt was actively responding to his reported auditory hallucinations, which made understanding some responses difficult. When pt was redirected he spoke coherently and did not seem to be under the influence of any substances. Pt made good eye contact and answered questions. Pt presented euthymic, calm and open to the assessment process. Pt presented with no impairments of remote or recent memory.    Diagnosis: F25.0 Schizoaffective disorder, Bipolar  type  Past Medical History:  Past Medical History:  Diagnosis Date  . Asthma   . CHF (congestive heart failure) ([place])   . Chronic back pain   . COPD (chronic obstructive pulmonary disease) ([place])   . Knee pain, chronic   . Migraines   . Schizophrenia, schizo-affective ([place])     Past Surgical History:  Procedure Laterality Date  . extraction of wisdom teeth    . KNEE SURGERY     four times    Family History:  Family History  Problem Relation Age of Onset  . Hypertension Father     Social History:  reports that he has been smoking cigarettes. He has a 20.00 pack-year smoking history. He has never used smokeless tobacco. He reports that he does not drink alcohol or use drugs.  Additional Social History:  Alcohol / Drug Use Pain Medications: Pt reports being prescribed [place].  Prescriptions: Pt reports being prescribed Zyprexa, Doxepine.  Over the Counter: See MAR.  History of alcohol / drug use?: Yes Longest period of sobriety (when/how long): 10 Years reported with no alcohol of marijuana use.  Substance #1 Name of Substance 1: THC 1 - Age of First Use: 70 1 - Amount (size/oz): Pt stated that he is unsure.  1 - Frequency: Pt stated that he has not used since 2009. 1 - Duration: 6 Months 1 - Last Use / Amount: 2009 Substance #2 Name of Substance 2: Alcohol  2 - Age of First Use: 13 2 - Amount (size/oz): Pt reports social beer drinking.  2 - Frequency: Pt reports social drinking.  2 -  Duration: Pt denies heavy use.  2 - Last Use / Amount: 06/10/2018  CIWA: CIWA-Ar BP: (!) 134/108 Pulse Rate: 88 COWS:    Allergies:  Allergies  Allergen Reactions  . Amoxicillin Diarrhea    Has patient had a PCN reaction causing immediate rash, facial/tongue/throat swelling, SOB or lightheadedness with hypotension: No Has patient had a PCN reaction causing severe rash involving mucus membranes or skin necrosis: No Has patient had a PCN reaction that required hospitalization:  No Has patient had a PCN reaction occurring within the last 10 years: No If all of the above answers are "NO", then may proceed with Cephalosporin use.   Marland Kitchen Dextromethorphan-Guaifenesin Other (See Comments)    unknown  . Haloperidol Other (See Comments)    unspecified  . Meloxicam Other (See Comments)    unspecified  . Nsaids Other (See Comments)    Rectal bleeding  . Prednisone Other (See Comments)    unspecified  . Quetiapine Other (See Comments)    Psychosis with high dose (600mg )  . Sudafed [Pseudoephedrine Hcl] Other (See Comments)    Hallucinations  . Ziprasidone Other (See Comments)    unspecified    Home Medications:  (Not in a hospital admission)  OB/GYN Status:  No LMP for male patient.  General Assessment Data Location of Assessment: [facility] ED TTS Assessment: In system Is this a Tele or Face-to-Face Assessment?: Tele Assessment Is this an Initial Assessment or a Re-assessment for this encounter?: Initial Assessment Patient Accompanied by:: Other(Pt alone. ) Language Other than English: No Living Arrangements: (Own Home) What gender do you identify as?: Male Marital status: Widowed(Pt reports that his wife died in 2004-12-07.) [place] name: N/A Pregnancy Status: No Living Arrangements: Alone Can pt return to current living arrangement?: No Admission Status: Voluntary Is patient capable of signing voluntary admission?: Yes Referral Source: Self/Family/Friend Insurance type: Facilities manager Exam ([place]) Medical Exam completed: Yes  Crisis Care Plan Living Arrangements: Alone Legal Guardian: Other:(Self) Name of Psychiatrist: [facility] Name of Therapist: Pt denied.   Education Status Is patient currently in school?: No Is the patient employed, unemployed or receiving disability?: Receiving disability income  Risk to self with the past 6 months Suicidal Ideation: No Has patient been a risk to self within the past 6 months  prior to admission? : Yes Suicidal Intent: No Has patient had any suicidal intent within the past 6 months prior to admission? : Yes Is patient at risk for suicide?: No, but patient needs Medical Clearance Suicidal Plan?: No Has patient had any suicidal plan within the past 6 months prior to admission? : Yes Access to Means: Yes Specify Access to Suicidal Means: Overdose  What has been your use of drugs/alcohol within the last 12 months?: Pt denied recent use.  Previous Attempts/Gestures: Yes How many times?: 40 Other Self Harm Risks: Denied Triggers for Past Attempts: (Pt stated, "Hopelessness. " ) Intentional Self Injurious Behavior: None Family Suicide History: No Recent stressful life event(s): Other (Comment)(Pt stated, "I ran out of medications. I haven't slept in 4 d) Persecutory voices/beliefs?: No Depression: Yes Depression Symptoms: Feeling worthless/self pity, Isolating, Fatigue Substance abuse history and/or treatment for substance abuse?: Yes Suicide prevention information given to non-admitted patients: Not applicable  Risk to Others within the past 6 months Homicidal Ideation: No Does patient have any lifetime risk of violence toward others beyond the six months prior to admission? : No Thoughts of Harm to Others: No Current Homicidal Intent: No Current  Homicidal Plan: No Access to Homicidal Means: No Identified Victim: Denied.  History of harm to others?: No Assessment of Violence: None Noted Violent Behavior Description: Denied Does patient have access to weapons?: No Criminal Charges Pending?: Yes Describe Pending Criminal Charges: Pt stated that he does not want to share. Does patient have a court date: Yes Court Date: (Pt stated that he did not want to share.) Is patient on probation?: No  Psychosis Hallucinations: Auditory(PT stated that he is talking to someone. Pt is responding. ) Delusions: None noted  Mental Status Report Appearance/Hygiene: In  scrubs, Unremarkable Eye Contact: Good Motor Activity: Unremarkable Speech: Slurred, Slow, Soft Level of Consciousness: Alert Mood: Euthymic Affect: Appropriate to circumstance Anxiety Level: None Thought Processes: Coherent, Relevant Judgement: Impaired Orientation: Person, Place, Time, Situation, Appropriate for developmental age Obsessive Compulsive Thoughts/Behaviors: None  Cognitive Functioning Concentration: Normal Memory: Recent Intact, Remote Intact Is patient IDD: No Insight: Fair Impulse Control: Poor Appetite: Poor Have you had any weight changes? : No Change Sleep: Decreased Total Hours of Sleep: 6 Vegetative Symptoms: None  ADLScreening [facility] Assessment Services) Patient's cognitive ability adequate to safely complete daily activities?: Yes Patient able to express need for assistance with ADLs?: Yes Independently performs ADLs?: Yes (appropriate for developmental age)  Prior Inpatient Therapy Prior Inpatient Therapy: Yes Prior Therapy Dates: 2019 Prior Therapy Facilty/Provider(s): [facility] Reason for Treatment: Hallucinations  Prior Outpatient Therapy Prior Outpatient Therapy: No Does patient have an ACCT team?: No Does patient have Intensive In-House Services?  : No Does patient have [facility] services? : No Does patient have P4CC services?: No  ADL Screening (condition at time of admission) Patient's cognitive ability adequate to safely complete daily activities?: Yes Is the patient deaf or have difficulty hearing?: No Does the patient have difficulty seeing, even when wearing glasses/contacts?: No Does the patient have difficulty concentrating, remembering, or making decisions?: No Patient able to express need for assistance with ADLs?: Yes Does the patient have difficulty dressing or bathing?: No Independently performs ADLs?: Yes (appropriate for developmental age) Is this a change from baseline?: Pre-admission baseline Is this a change from  baseline?: Pre-admission baseline Is this a change from baseline?: Pre-admission baseline Is this a change from baseline?: Pre-admission baseline Is this a change from baseline?: Pre-admission baseline Is this a change from baseline?: Pre-admission baseline Is this a change from baseline?: Pre-admission baseline Is this a change from baseline?: Pre-admission baseline Does the patient have difficulty walking or climbing stairs?: No Weakness of Legs: None Weakness of Arms/Hands: None  Home Assistive Devices/Equipment Home Assistive Devices/Equipment: None  Therapy Consults (therapy consults require a physician order) PT Evaluation Needed: No OT Evalulation Needed: No SLP Evaluation Needed: No Abuse/Neglect Assessment (Assessment to be complete while patient is alone) Abuse/Neglect Assessment Can Be Completed: Yes Physical Abuse: Denies Verbal Abuse: Denies Sexual Abuse: Denies Exploitation of patient/patient's resources: Denies Self-Neglect: Denies Values / Beliefs Cultural Requests During Hospitalization: None Spiritual Requests During Hospitalization: None Consults Spiritual Care Consult Needed: No Social Work Consult Needed: No Regulatory affairs officer (For Healthcare) Does Patient Have a Medical Advance Directive?: No Would patient like information on creating a medical advance directive?: No - Patient declined          Disposition: Per Lindon Romp, NP; Pt recommended for inpatient treatment.  Disposition Initial Assessment Completed for this Encounter: Yes  This service was provided via telemedicine using a 2-way, interactive audio and video technology.  Names of all persons participating in this telemedicine service and their role  in this encounter. Name: [name] Krager Role: Patient   Name: Jannet Askew Role: Clinician  Name:  Role:   Name:  Role:   Jannet Askew, M.S., [facility], [place] Triage Specialist [facility] 06/25/2018 6:09 AM

## 2018-06-25 NOTE — ED Notes (Signed)
Meal tray ordered 

## 2018-06-25 NOTE — Consult Note (Signed)
  Tele Assessment   [name], 47 y.o., male patient presented [facility] with complaints about running out of his psychotropic medication for 4 days.  Patient was recently discharged from [facility] where he was treated for suicide attempt via overdose.  Patient seen via telepsych by this provider; chart reviewed and consulted with Dr. Dwyane Dee on 06/25/18.  On evaluation [name] reports he ran out of his medication.  Patient was unable to state when he ran out of his medication.    During evaluation [name] is elevated in bed.  Patient is alert; oriented to name and place.  Patient appears to be responding to internal stimuli; he appears to be talking to someone not present.  Spoke to patient's nurse Roselyn Reef, RN) who states that patient is actively hallucination and paranoia.  "I took him a bottle of water earlier and he came back out and told me to look at what you put in it and wouldn't drink it"; states he is pacing hall.    Recommendations:  Inpatient psychiatric treatment  Disposition: No evidence of imminent risk to self or others at present.   Patient does not meet criteria for psychiatric inpatient admission. Supportive therapy provided about ongoing stressors. Discussed crisis plan, support from social network, calling 911, coming to the Emergency Department, and calling Suicide Hotline.   Earleen Newport, NP

## 2018-06-25 NOTE — Progress Notes (Signed)
Nurse, Beckie Busing informed of pt disposition.

## 2018-06-25 NOTE — ED Notes (Signed)
Pt pacing around unit, redirected to room. Offered TV and books, pt declined. Given graham crackers and water, pt did not eat.

## 2018-06-25 NOTE — ED Notes (Signed)
Pt agitated staring at nothing and saying "let her out of there, let her out of there."

## 2018-06-25 NOTE — ED Notes (Signed)
Pt refusing to eat.  

## 2018-06-25 NOTE — ED Provider Notes (Signed)
Alert and calm. He understands that he is being placed in a psychiatric facility. Home meds ordered.   Daleen Bo, MD 06/25/18 [phone]

## 2018-06-25 NOTE — ED Notes (Signed)
Lunch tray ordered 

## 2018-06-25 NOTE — ED Notes (Signed)
Belongings placed in locker in [place]

## 2018-06-25 NOTE — ED Notes (Signed)
Pt refusing to eat meal, crawling under bed looking for an axe.

## 2018-06-26 DIAGNOSIS — F250 Schizoaffective disorder, bipolar type: Secondary | ICD-10-CM | POA: Diagnosis not present

## 2018-06-26 MED ORDER — ZOLPIDEM TARTRATE 5 MG PO TABS
5.0000 mg | ORAL_TABLET | Freq: Every evening | ORAL | Status: DC | PRN
  Administered 2018-06-26 (×2): 5 mg via ORAL
  Filled 2018-06-26 (×2): qty 1

## 2018-06-26 NOTE — ED Triage Notes (Signed)
Pt sleeping but wakes easily  With voice.

## 2018-06-26 NOTE — ED Notes (Signed)
Sisters number [phone]

## 2018-06-26 NOTE — ED Notes (Signed)
Pt was woke up to take TTS call but fell asleep so the call was dropped. Pt is still sleeping soundly

## 2018-06-26 NOTE — ED Notes (Signed)
Pt walking around the unit with a towel tied on top of his head like a turban. Redirectable to room.

## 2018-06-26 NOTE — ED Triage Notes (Signed)
PT pacing in hall and has to be redirected to his room.

## 2018-06-26 NOTE — ED Triage Notes (Signed)
Pt slept the majority of the day, waking up at 5PM. Pt first started pacing out side of room. He then took a shower and tried to make multiple phone calls. Pt is now pacing and talking out loud to self.

## 2018-06-26 NOTE — ED Triage Notes (Signed)
PT now pacing in hall making phone call and now request to take a shower.

## 2018-06-26 NOTE — ED Triage Notes (Signed)
Pt is now in shower.

## 2018-06-26 NOTE — ED Notes (Signed)
Pacing in the hall at this time with no distress noted.  During reports requested pain medication.  On questioning of where he is hurting states I feel better I don't need anything.

## 2018-06-26 NOTE — ED Notes (Addendum)
Pt stating "I'm ready to burn this house down" over and over. When this RN questioned pt on what house he is referring to, pt pointed to the ground and said, "This house!" Pt proceeded to walk into the hallway and make repeated motions like he was torching the walls and talk about adding kerosene to the "poison" fire. Pt redirected to room and encouraged to rest, at which time he requested we give him "regular old paper" and a match so he can start an "old fashioned fire."  When asked about thoughts of hurting himself or others, pt continues to deny such.

## 2018-06-26 NOTE — ED Triage Notes (Signed)
Pt is sleeping but wakes with voice.

## 2018-06-26 NOTE — ED Triage Notes (Signed)
Pt sleeping but wakes with voice stmuli. Pt reports he feels sleepy from meds. Pt AO x4

## 2018-06-26 NOTE — ED Notes (Signed)
Pt pacing around in room and trying to exit room at times. Seems to be carrying on conversation with external auditory stimuli.

## 2018-06-26 NOTE — ED Notes (Addendum)
Pt has slept approx. 20 minutes the entire night. Called Dr. Roxanne Mins to request sleep aide, will put in orders.

## 2018-06-26 NOTE — ED Notes (Addendum)
Reg food tray called in @ 9:55am for lunch

## 2018-06-26 NOTE — ED Notes (Signed)
Woke pt to eat but he says he doesn't want to wake up to eat yet. Informed him his breakfast was at his bedside on his tray within reach for when he wakes up. Pt stated "OK"

## 2018-06-26 NOTE — ED Notes (Signed)
Pt continuing to pace in room, still conversing with people who are not present.

## 2018-06-26 NOTE — ED Triage Notes (Signed)
Pt at Staff desk asking why he was here. Pt has been sleeping most of the day and now appears to be AO.

## 2018-06-26 NOTE — ED Notes (Signed)
Regular diet tray ordered for breakfast. 

## 2018-06-26 NOTE — ED Triage Notes (Signed)
TTS was done this morning 09-26-17

## 2018-06-26 NOTE — ED Triage Notes (Signed)
Dinner ordered 

## 2018-06-26 NOTE — Progress Notes (Signed)
TTS reassessment: Patient is calm and stated "I haven't woken up yet." He reviewed with clinician that he ran out of his psychiatric medications and so he came to the hospital. Patient denies any SI/HI/AVH. However, this is contradictory to nurse report. The previous evening patient continued to be responding to internal stimuli, threatening to burn down the facility, and experiencing worsening hallucinations. Patient continues to meet in patient criteria.

## 2018-06-27 ENCOUNTER — Other Ambulatory Visit: Payer: Self-pay

## 2018-06-27 NOTE — Consult Note (Signed)
Telepsych Consultation   Reason for Consult: hallucinations and paranoia Referring Physician:  Eulis Foster Location of Patient: [facility] Location of Provider: [place] Department  Patient Identification: [name] MRN:  [id number] Principal Diagnosis: Schizophrenia, paranoid type [facility]) Diagnosis:   Patient Active Problem List   Diagnosis Date Noted  . Suicide attempt ([place]) [T14.91XA]   . Benzodiazepine (tranquilizer) overdose, intentional self-harm, initial encounter ([place]) [T42.4X2A] 06/13/2018  . Overdose of benzodiazepine, intentional self-harm, initial encounter ([place]) [T42.4X2A]   . Somnolence [R40.0]   . Schizophrenia, paranoid type ([place]) [F20.0] 08/19/2017  . Hypokalemia [E87.6] 04/28/2014  . Drug overdose [T50.901A] 04/27/2014  . Schizoaffective disorder, bipolar type ([place]) [F25.0] 03/02/2014  . Anxiety state [F41.1] 03/02/2014  . Benzodiazepine dependence ([place]) [F13.20] 03/02/2014  . Chest pain [R07.9] 10/11/2013  . EKG abnormalities [R94.31] 10/11/2013  . Chronic back pain [M54.9, G89.29]     Total Time spent with patient: 30 minutes  Subjective:   [name] is a 47 y.o. male patient admitted with hallucinations after running out of his medications. Upon assessment today, patient is alert and oriented, although irritable he is able to follow commands.   HPI:  [name], 47 y.o., male patient presented [facility] with complaints about running out of his psychotropic medication for 4 days.  Patient was recently discharged from [facility] where he was treated for suicide attempt via overdose.  Patient seen via telepsych by this provider; chart reviewed and consulted with Dr. Dwyane Dee on 06/25/18.  On evaluation [name] reports he ran out of his medication.  Patient was unable to state when he ran out of his medication.    During evaluation [name] is elevated in bed.  Patient is alert; oriented to name and place.  Patient appears to be responding to internal stimuli;  he appears to be talking to someone not present.  Spoke to patient's nurse Roselyn Reef, RN) who states that patient is actively hallucination and paranoia.  "I took him a bottle of water earlier and he came back out and told me to look at what you put in it and wouldn't drink it"; states he is pacing hall.   Past Psychiatric History: Outpatient: [facility].  IP: most recent [facility] 06/13/18 for bzd overdose. He reports a history of over 40 suicide attempts.   Risk to Self: Suicidal Ideation: No Suicidal Intent: No Is patient at risk for suicide?: No, but patient needs Medical Clearance Suicidal Plan?: No Access to Means: Yes Specify Access to Suicidal Means: Overdose  What has been your use of drugs/alcohol within the last 12 months?: Pt denied recent use.  How many times?: 40 Other Self Harm Risks: Denied Triggers for Past Attempts: (Pt stated, "Hopelessness. " ) Intentional Self Injurious Behavior: None Risk to Others: Homicidal Ideation: No Thoughts of Harm to Others: No Current Homicidal Intent: No Current Homicidal Plan: No Access to Homicidal Means: No Identified Victim: Denied.  History of harm to others?: No Assessment of Violence: None Noted Violent Behavior Description: Denied Does patient have access to weapons?: No Criminal Charges Pending?: Yes Describe Pending Criminal Charges: Pt stated that he does not want to share. Does patient have a court date: Yes Court Date: (Pt stated that he did not want to share.) Prior Inpatient Therapy: Prior Inpatient Therapy: Yes Prior Therapy Dates: 2019 Prior Therapy Facilty/Provider(s): [facility] Reason for Treatment: Hallucinations Prior Outpatient Therapy: Prior Outpatient Therapy: No Does patient have an ACCT team?: No Does patient  have Intensive In-House Services?  : No Does patient have [facility] services? : No Does patient have P4CC services?: No  Past Medical History:  Past Medical History:   Diagnosis Date  . Asthma   . CHF (congestive heart failure) ([place])   . Chronic back pain   . COPD (chronic obstructive pulmonary disease) ([place])   . Knee pain, chronic   . Migraines   . Schizophrenia, schizo-affective ([place])     Past Surgical History:  Procedure Laterality Date  . extraction of wisdom teeth    . KNEE SURGERY     four times   Family History:  Family History  Problem Relation Age of Onset  . Hypertension Father    Family Psychiatric  History: Unable to assess Social History:  Social History   Substance and Sexual Activity  Alcohol Use No   Comment: occ     Social History   Substance and Sexual Activity  Drug Use No    Social History   Socioeconomic History  . Marital status: Single    Spouse name: Not on file  . Number of children: Not on file  . Years of education: Not on file  . Highest education level: Not on file  Occupational History  . Not on file  Social Needs  . Financial resource strain: Not on file  . Food insecurity:    Worry: Not on file    Inability: Not on file  . Transportation needs:    Medical: Not on file    Non-medical: Not on file  Tobacco Use  . Smoking status: Current Every Day Smoker    Packs/day: 2.00    Years: 10.00    Pack years: 20.00    Types: Cigarettes  . Smokeless tobacco: Never Used  Substance and Sexual Activity  . Alcohol use: No    Comment: occ  . Drug use: No  . Sexual activity: Never  Lifestyle  . Physical activity:    Days per week: Not on file    Minutes per session: Not on file  . Stress: Not on file  Relationships  . Social connections:    Talks on phone: Not on file    Gets together: Not on file    Attends religious service: Not on file    Active member of club or organization: Not on file    Attends meetings of clubs or organizations: Not on file    Relationship status: Not on file  Other Topics Concern  . Not on file  Social History Narrative  . Not on file   Additional Social  History:    Allergies:   Allergies  Allergen Reactions  . Amoxicillin Diarrhea    Has patient had a PCN reaction causing immediate rash, facial/tongue/throat swelling, SOB or lightheadedness with hypotension: No Has patient had a PCN reaction causing severe rash involving mucus membranes or skin necrosis: No Has patient had a PCN reaction that required hospitalization: No Has patient had a PCN reaction occurring within the last 10 years: No If all of the above answers are "NO", then may proceed with Cephalosporin use.   Marland Kitchen Dextromethorphan-Guaifenesin Other (See Comments)    unknown  . Haloperidol Other (See Comments)    unspecified  . Meloxicam Other (See Comments)    unspecified  . Nsaids Other (See Comments)    Rectal bleeding  . Prednisone Other (See Comments)    unspecified  . Quetiapine Other (See Comments)    Psychosis with high dose (600mg )  .  Sudafed [Pseudoephedrine Hcl] Other (See Comments)    Hallucinations  . Ziprasidone Other (See Comments)    unspecified    Labs: No results found for this or any previous visit (from the past 48 hour(s)).  Medications:  Current Facility-Administered Medications  Medication Dose Route Frequency Provider Last Rate Last Dose  . doxepin (SINEQUAN) capsule 50 mg  50 mg Oral QHS Daleen Bo, MD   50 mg at 06/26/18 2120  . OLANZapine (ZYPREXA) tablet 5 mg  5 mg Oral QHS Daleen Bo, MD   5 mg at 06/26/18 2121  . zolpidem (AMBIEN) tablet 5 mg  5 mg Oral QHS PRN Delora Fuel, MD   5 mg at 06/26/18 2121   Current Outpatient Medications  Medication Sig Dispense Refill  . doxepin (SINEQUAN) 50 MG capsule Take 50 mg by mouth at bedtime.    . NORCO 10-325 MG tablet Take 0.5-1 tablets by mouth 2 (two) times daily as needed for pain.  0  . OLANZapine (ZYPREXA) 5 MG tablet Take 5 mg by mouth at bedtime.      Musculoskeletal: Strength & Muscle Tone: within normal limits Gait & Station: normal Patient leans: N/A  Psychiatric  Specialty Exam: Physical Exam  ROS  Blood pressure 97/60, pulse (!) 53, temperature 98.7 F (37.1 C), temperature source Oral, resp. rate 16, height 6\' 1"  (1.854 m), weight 89.8 kg, SpO2 98 %.Body mass index is 26.12 kg/m.  General Appearance: Fairly Groomed  Eye Contact:  Good  Speech:  Clear and Coherent and Normal Rate  Volume:  Normal  Mood:  Irritable  Affect:  Congruent  Thought Process:  Coherent, Linear and Descriptions of Associations: Intact  Orientation:  Full (Time, Place, and Person)  Thought Content:  WDL and Hallucinations: None  Suicidal Thoughts:  No  Homicidal Thoughts:  No  Memory:  Immediate;   Fair Recent;   Fair  Judgement:  Intact  Insight:  Lacking  Psychomotor Activity:  Normal  Concentration:  Concentration: Fair and Attention Span: Fair  Recall:  [employer] of Knowledge:  Fair  Language:  Fair  Akathisia:  No  Handed:  Right  AIMS (if indicated):     Assets:  Communication Skills Desire for Improvement Financial Resources/Insurance Leisure Time Physical Health Social Support Vocational/Educational  ADL's:  Intact  Cognition:  WNL  Sleep:        Treatment Plan Summary: Plan Discharge home. patient with chronic mental illness, and would benefit from ongoing care services due to multiple hospitalizations and suicide attempts. Will refer to ACT team, [facility]. Spoke in length to his sister Davy Pique, who is aware of referral to ACT and agrees with these services.She will be able to rpovide him transportation and accompany him to his intake appointment for ACT team. Will continue with current medications, no additional changes at this time.   Disposition: No evidence of imminent risk to self or others at present.   Patient does not meet criteria for psychiatric inpatient admission. Supportive therapy provided about ongoing stressors. Discussed crisis plan, support from social network, calling 911, coming to the Emergency Department, and  calling Suicide Hotline. Refer to ACT team.   This service was provided via telemedicine using a 2-way, interactive audio and video technology.  Names of all persons participating in this telemedicine service and their role in this encounter. Name:Melitta Tigue Henrene Pastor Role: FNP at [facility] [place] [place], [place] 06/27/2018 10:51 AM

## 2018-06-27 NOTE — ED Notes (Signed)
Pt on phone at nurses' desk - stating he has been in ED x 3 weeks and wants to know why he is here. Pt also stating he has court dates pending. Pt returned to room.

## 2018-06-27 NOTE — ED Notes (Signed)
ALL belongings - 1 labeled belongings bag, 1 valuables envelope, and home meds - returned to pt - Pt signed verifying all items present. D/C paperwork discussed and given. Voiced understanding [facility] will be contacting him. Bus pass given.

## 2018-06-27 NOTE — ED Notes (Signed)
Telepsych being performed. 

## 2018-06-27 NOTE — ED Notes (Signed)
Pt attempted to make phone call at nurses' desk. Voiced understanding of phone call use. Pt then returned to room. Pt denies SI/HI. States he does not feel he needs Inpt Tx at this time.

## 2018-06-27 NOTE — ED Notes (Signed)
[name], sister, [phone] - advised she does not drive and is not able to come pick up pt. Suggested to give pt bus pass.

## 2018-07-12 ENCOUNTER — Emergency Department (HOSPITAL_COMMUNITY)
Admission: EM | Admit: 2018-07-12 | Discharge: 2018-07-12 | Discharge status: Against Medical Advice | Payer: Medicare Other | Attending: Emergency Medicine | Admitting: Emergency Medicine

## 2018-07-12 ENCOUNTER — Other Ambulatory Visit: Payer: Self-pay

## 2018-07-12 DIAGNOSIS — Z5329 Procedure and treatment not carried out because of patient's decision for other reasons: Secondary | ICD-10-CM | POA: Insufficient documentation

## 2018-07-12 DIAGNOSIS — M549 Dorsalgia, unspecified: Secondary | ICD-10-CM

## 2018-07-12 DIAGNOSIS — Z79899 Other long term (current) drug therapy: Secondary | ICD-10-CM | POA: Diagnosis not present

## 2018-07-12 DIAGNOSIS — I509 Heart failure, unspecified: Secondary | ICD-10-CM | POA: Insufficient documentation

## 2018-07-12 DIAGNOSIS — F17210 Nicotine dependence, cigarettes, uncomplicated: Secondary | ICD-10-CM | POA: Diagnosis not present

## 2018-07-12 DIAGNOSIS — T50992A Poisoning by other drugs, medicaments and biological substances, intentional self-harm, initial encounter: Secondary | ICD-10-CM | POA: Insufficient documentation

## 2018-07-12 DIAGNOSIS — J45909 Unspecified asthma, uncomplicated: Secondary | ICD-10-CM | POA: Insufficient documentation

## 2018-07-12 DIAGNOSIS — T50902A Poisoning by unspecified drugs, medicaments and biological substances, intentional self-harm, initial encounter: Secondary | ICD-10-CM

## 2018-07-12 LAB — COMPREHENSIVE METABOLIC PANEL
ALBUMIN: 4 g/dL (ref 3.5–5.0)
ALK PHOS: 50 U/L (ref 38–126)
ALT: 18 U/L (ref 0–44)
ANION GAP: 4 — AB (ref 5–15)
AST: 19 U/L (ref 15–41)
BILIRUBIN TOTAL: 0.6 mg/dL (ref 0.3–1.2)
BUN: 5 mg/dL — ABNORMAL LOW (ref 6–20)
CALCIUM: 9.5 mg/dL (ref 8.9–10.3)
CO2: 26 mmol/L (ref 22–32)
Chloride: 111 mmol/L (ref 98–111)
Creatinine, Ser: 0.82 mg/dL (ref 0.61–1.24)
GFR calc Af Amer: >60 mL/min (ref >60)
GFR calc non Af Amer: >60 mL/min (ref >60)
GLUCOSE: 98 mg/dL (ref 70–99)
Potassium: 3.6 mmol/L (ref 3.5–5.1)
Sodium: 141 mmol/L (ref 135–145)
TOTAL PROTEIN: 6.8 g/dL (ref 6.5–8.1)

## 2018-07-12 LAB — CBC
HCT: 47.3 % (ref 39.0–52.0)
HEMOGLOBIN: 15.3 g/dL (ref 13.0–17.0)
MCH: 29.3 pg (ref 26.0–34.0)
MCHC: 32.3 g/dL (ref 30.0–36.0)
MCV: 90.6 fL (ref 80.0–100.0)
Platelets: 213 10*3/uL (ref 150–400)
RBC: 5.22 MIL/uL (ref 4.22–5.81)
RDW: 12.6 % (ref 11.5–15.5)
WBC: 8.9 10*3/uL (ref 4.0–10.5)
nRBC: 0 % (ref 0.0–0.2)

## 2018-07-12 LAB — SALICYLATE LEVEL: Salicylate Lvl: <7 mg/dL (ref 2.8–30.0)

## 2018-07-12 LAB — ETHANOL

## 2018-07-12 LAB — CBG MONITORING, ED: GLUCOSE-CAPILLARY: 79 mg/dL (ref 70–99)

## 2018-07-12 LAB — ACETAMINOPHEN LEVEL

## 2018-07-12 NOTE — ED Triage Notes (Signed)
Patient c/o back pain "every time I walk somewhere". Patient speech is profoundly slurred; states that he took 10mg  zyprexa and 3 clonazepam. Also states that he is hearing voices over the air conditioner.

## 2018-07-12 NOTE — ED Notes (Signed)
Pt stated "im fucking leaving" and continued to walk out of hospital AMA without belongings. Security is retrieving belongings.

## 2018-07-12 NOTE — ED Notes (Addendum)
This RN was sticking this pt for blood work. While this RN was getting blood, pt ripped needle out of their arm and tossed it across the room. The pt continued to rip all cardiac leads off and well as his BP cuff. Pt stormed out of room to the waiting room. This RN encouraged opt to stay because his care was not completed. MD [name] Nine notified of pt leaving.

## 2018-07-12 NOTE — ED Provider Notes (Signed)
[facility] EMERG[facility] DEPARTMENT Provider Note   CSN: [id number] Arrival date & time: 07/12/18  0347     History   Chief Complaint Chief Complaint  Patient presents with  . Back Pain    HPI [name] is a 47 y.o. male.  47 yo M with a chief complaint of back pain.  This is what was reported from triage.  By my evaluation the patient is very sleepy.  Hard to get a history from him.  He was recently in the ED with an intentional benzodiazepine overdose.  He did not claim this in triage so it sounded like he took multiple medications just prior to arrival.  Level 5 caveat AMS.   The history is provided by the patient.  Back Pain   Pertinent negatives include no chest pain, no fever, no headaches and no abdominal pain.  Illness  This is a new problem. The current episode started 2 days ago. The problem occurs constantly. The problem has been gradually worsening. Pertinent negatives include no chest pain, no abdominal pain, no headaches and no shortness of breath. Nothing aggravates the symptoms. Nothing relieves the symptoms. He has tried nothing for the symptoms. The treatment provided no relief.    Past Medical History:  Diagnosis Date  . Asthma   . CHF (congestive heart failure) ([place])   . Chronic back pain   . COPD (chronic obstructive pulmonary disease) ([place])   . Knee pain, chronic   . Migraines   . Schizophrenia, schizo-affective [facility])     Patient Active Problem List   Diagnosis Date Noted  . Suicide attempt ([place])   . Benzodiazepine (tranquilizer) overdose, intentional self-harm, initial encounter ([place]) 06/13/2018  . Overdose of benzodiazepine, intentional self-harm, initial encounter ([place])   . Somnolence   . Schizophrenia, paranoid type ([place]) 08/19/2017  . Hypokalemia 04/28/2014  . Drug overdose 04/27/2014  . Schizoaffective disorder, bipolar type ([place]) 03/02/2014  . Anxiety state 03/02/2014  . Benzodiazepine dependence ([place]) 03/02/2014  .  Chest pain 10/11/2013  . EKG abnormalities 10/11/2013  . Chronic back pain     Past Surgical History:  Procedure Laterality Date  . extraction of wisdom teeth    . KNEE SURGERY     four times        Home Medications    Prior to Admission medications   Medication Sig Start Date End Date Taking? Authorizing Provider  doxepin (SINEQUAN) 50 MG capsule Take 50 mg by mouth at bedtime. 06/22/18   [provider]  NORCO 10-325 MG tablet Take 0.5-1 tablets by mouth 2 (two) times daily as needed for pain. 05/20/18   [provider]  OLANZapine (ZYPREXA) 5 MG tablet Take 5 mg by mouth at bedtime.    [provider]    Family History Family History  Problem Relation Age of Onset  . Hypertension Father     Social History Social History   Tobacco Use  . Smoking status: Current Every Day Smoker    Packs/day: 2.00    Years: 10.00    Pack years: 20.00    Types: Cigarettes  . Smokeless tobacco: Never Used  Substance Use Topics  . Alcohol use: No    Comment: occ  . Drug use: No     Allergies   Amoxicillin; Dextromethorphan-guaifenesin; Haloperidol; Meloxicam; Nsaids; Prednisone; Quetiapine; Sudafed [pseudoephedrine hcl]; and Ziprasidone   Review of Systems Review of Systems  Unable to perform ROS: Mental status change  Constitutional: Negative for chills  and fever.  HENT: Negative for congestion and facial swelling.   Eyes: Negative for discharge and visual disturbance.  Respiratory: Negative for shortness of breath.   Cardiovascular: Negative for chest pain and palpitations.  Gastrointestinal: Negative for abdominal pain, diarrhea and vomiting.  Musculoskeletal: Positive for back pain. Negative for arthralgias and myalgias.  Skin: Negative for color change and rash.  Neurological: Negative for tremors, syncope and headaches.  Psychiatric/Behavioral: Negative for confusion and dysphoric mood.     Physical Exam Updated Vital Signs BP 101/74    Pulse (!) 52   Temp 98.1 F (36.7 C) (Oral)   Resp 20   SpO2 100%   Physical Exam  Constitutional: He appears well-developed and well-nourished.  HENT:  Head: Normocephalic and atraumatic.  Eyes: Pupils are equal, round, and reactive to light. EOM are normal.  Neck: Normal range of motion. Neck supple. No JVD present.  Cardiovascular: Normal rate and regular rhythm. Exam reveals no gallop and no friction rub.  No murmur heard. Pulmonary/Chest: No respiratory distress. He has no wheezes.  Abdominal: He exhibits no distension. There is no rebound and no guarding.  Musculoskeletal: Normal range of motion.  Neurological:  Hard to arouse, when awoke speaks in garbled language.  Skin: No rash noted. No pallor.  Nursing note and vitals reviewed.    ED Treatments / Results  Labs (all labs ordered are listed, but only abnormal results are displayed) Labs Reviewed  COMPREHENSIVE METABOLIC PANEL - Abnormal; Notable for the following components:      Result Value   BUN 5 (*)    Anion gap 4 (*)    All other components within normal limits  ACETAMINOPHEN LEVEL - Abnormal; Notable for the following components:   Acetaminophen (Tylenol), Serum <10 (*)    All other components within normal limits  ETHANOL  SALICYLATE LEVEL  CBC  RAPID URINE DRUG SCREEN, HOSP PERFORMED  URINALYSIS, ROUTINE W REFLEX MICROSCOPIC  CBG MONITORING, ED  I-STAT VENOUS BLOOD GAS, ED    EKG EKG Interpretation  Date/Time:  Monday July 12 2018 08:09:24 EST Ventricular Rate:  55 PR Interval:    QRS Duration: 102 QT Interval:  472 QTC Calculation: 452 R Axis:   92 Text Interpretation:  Sinus rhythm Consider right ventricular hypertrophy No significant change since last tracing Confirmed by Deno Etienne [phone]) on 07/12/2018 8:30:12 AM   Radiology No results found.  Procedures Procedures (including critical care time)  Medications Ordered in ED Medications - No data to display   Initial  Impression / Assessment and Plan / ED Course  I have reviewed the triage vital signs and the nursing notes.  Pertinent labs & imaging results that were available during my care of the patient were reviewed by me and considered in my medical decision making (see chart for details).     47 yo M with a chief complaint of back pain.  I was unable to assess him for his back pain because he had intentionally taken medications prior to arrival that made him very sleepy.  No palpable back pain on my exam.  Will check labs continue the obs in the ED.  The patient was reassessed by the nurse.  The patient ripped out his IV and then threw it at the nursing staff and then eloped.  I do not feel that the patient was a risk to himself or others.  I do not feel that please need to be called to bring him back to the ED.  He does not appear to have significant back pain as he was able to ambulate without difficulty.  The patients results and plan were reviewed and discussed.   Any x-rays performed were independently reviewed by myself.   Differential diagnosis were considered with the presenting HPI.  Medications - No data to display  Vitals:   07/12/18 0628 07/12/18 0722 07/12/18 0730  BP: 119/80 106/79 101/74  Pulse: 67 (!) 52 (!) 52  Resp: 20    Temp: 98.1 F (36.7 C)    TempSrc: Oral    SpO2: 100% 100% 100%    Final diagnoses:  Back pain, unspecified back location, unspecified back pain laterality, unspecified chronicity  Intentional drug overdose, initial encounter [facility])      Final Clinical Impressions(s) / ED Diagnoses   Final diagnoses:  Back pain, unspecified back location, unspecified back pain laterality, unspecified chronicity  Intentional drug overdose, initial encounter [facility])    ED Discharge Orders    None       Deno Etienne, DO 07/12/18 [phone]

## 2018-07-12 NOTE — ED Notes (Signed)
Pt has left belongings in room.

## 2018-07-12 NOTE — ED Notes (Signed)
Pt seen exiting ED, ambulatory without distress, staff encouraged patient to stay

## 2018-07-16 ENCOUNTER — Emergency Department (HOSPITAL_COMMUNITY)
Admission: EM | Admit: 2018-07-16 | Discharge: 2018-07-17 | Disposition: A | Discharge status: Home / Self Care | Payer: Medicare Other | Attending: Emergency Medicine | Admitting: Emergency Medicine

## 2018-07-16 ENCOUNTER — Encounter (HOSPITAL_COMMUNITY): Payer: Self-pay | Admitting: Emergency Medicine

## 2018-07-16 DIAGNOSIS — M545 Low back pain, unspecified: Secondary | ICD-10-CM

## 2018-07-16 DIAGNOSIS — J45909 Unspecified asthma, uncomplicated: Secondary | ICD-10-CM | POA: Diagnosis not present

## 2018-07-16 DIAGNOSIS — R400 Somnolence: Secondary | ICD-10-CM | POA: Diagnosis not present

## 2018-07-16 DIAGNOSIS — G8929 Other chronic pain: Secondary | ICD-10-CM

## 2018-07-16 DIAGNOSIS — Z79899 Other long term (current) drug therapy: Secondary | ICD-10-CM | POA: Insufficient documentation

## 2018-07-16 DIAGNOSIS — F17210 Nicotine dependence, cigarettes, uncomplicated: Secondary | ICD-10-CM | POA: Diagnosis not present

## 2018-07-16 DIAGNOSIS — R4781 Slurred speech: Secondary | ICD-10-CM | POA: Insufficient documentation

## 2018-07-16 DIAGNOSIS — I509 Heart failure, unspecified: Secondary | ICD-10-CM | POA: Insufficient documentation

## 2018-07-16 NOTE — Discharge Instructions (Addendum)
Please return for any problem. Follow up with your regular physician in 2-3 days.

## 2018-07-16 NOTE — ED Triage Notes (Signed)
Pt transported from home by EMS with c/o low back pain. Pt reports he has not had Hydrocodone (norco) in over a month.  Pt falling asleep during triage interview.  Pt reports taking 4-5 Klonopin 2mg  in last 24 hours, Zyprexa and just PTA 1/8 of a bottle of "non alcohol" Nyquil. VSS.  Pt has requested more pain meds from his PCP but states he is unable to d/t no recent appointments.  Pt reports slurred speech is due to lack of dentures.

## 2018-07-16 NOTE — ED Notes (Signed)
MD at bedside, pt reports he is having no pain at this time.

## 2018-07-16 NOTE — ED Notes (Signed)
Pt moved to hallway, pt requesting beverage and bathroom.

## 2018-07-16 NOTE — ED Provider Notes (Signed)
[facility]ERGENCY DEPARTMENT Provider Note   CSN: [id number] Arrival date & time: 07/16/18  2002     History   Chief Complaint Chief Complaint  Patient presents with  . Back Pain    HPI [name] is a 47 y.o. male.  47 year old male with prior medical history as detailed below her eyes with complaint of back pain.  EMS is providing transport for the patient.  EMS reports the patient admits to taking an extra dose of benzodiazepines just prior to their arrival.  During the transport patient began to slur his speech slightly.  Upon my initial evaluation the patient is mildly somnolent.  He is without complaint of current back pain.  He denies other complaint.  He denies intentional overdose.  He reports that he was taking the extra dose of benzodiazepines in order to get some sleep.    He requests that he get a soda and be allowed to take a nap.  Patient currently denies current back pain, fever, nausea, vomiting, chest pain, shortness of breath, or other acute complaint.  The history is provided by medical records and the patient.  Illness  This is a chronic problem. Episode onset: uncertain. The problem occurs constantly. The problem has not changed since onset.Pertinent negatives include no chest pain, no abdominal pain, no headaches and no shortness of breath. Nothing aggravates the symptoms. Nothing relieves the symptoms.    Past Medical History:  Diagnosis Date  . Asthma   . CHF (congestive heart failure) ([place])   . Chronic back pain   . COPD (chronic obstructive pulmonary disease) ([place])   . Knee pain, chronic   . Migraines   . Schizophrenia, schizo-affective [facility])     Patient Active Problem List   Diagnosis Date Noted  . Suicide attempt ([place])   . Benzodiazepine (tranquilizer) overdose, intentional self-harm, initial encounter ([place]) 06/13/2018  . Overdose of benzodiazepine, intentional self-harm, initial encounter ([place])   . Somnolence   .  Schizophrenia, paranoid type ([place]) 08/19/2017  . Hypokalemia 04/28/2014  . Drug overdose 04/27/2014  . Schizoaffective disorder, bipolar type ([place]) 03/02/2014  . Anxiety state 03/02/2014  . Benzodiazepine dependence ([place]) 03/02/2014  . Chest pain 10/11/2013  . EKG abnormalities 10/11/2013  . Chronic back pain     Past Surgical History:  Procedure Laterality Date  . extraction of wisdom teeth    . KNEE SURGERY     four times        Home Medications    Prior to Admission medications   Medication Sig Start Date End Date Taking? Authorizing Provider  doxepin (SINEQUAN) 50 MG capsule Take 50 mg by mouth at bedtime. 06/22/18   [provider]  NORCO 10-325 MG tablet Take 0.5-1 tablets by mouth 2 (two) times daily as needed for pain. 05/20/18   [provider]  OLANZapine (ZYPREXA) 5 MG tablet Take 5 mg by mouth at bedtime.    [provider]    Family History Family History  Problem Relation Age of Onset  . Hypertension Father     Social History Social History   Tobacco Use  . Smoking status: Current Every Day Smoker    Packs/day: 2.00    Years: 10.00    Pack years: 20.00    Types: Cigarettes  . Smokeless tobacco: Never Used  Substance Use Topics  . Alcohol use: No    Comment: occ  . Drug use: No     Allergies   Amoxicillin; Dextromethorphan-guaifenesin;  Haloperidol; Meloxicam; Nsaids; Prednisone; Quetiapine; Sudafed [pseudoephedrine hcl]; and Ziprasidone   Review of Systems Review of Systems  Respiratory: Negative for shortness of breath.   Cardiovascular: Negative for chest pain.  Gastrointestinal: Negative for abdominal pain.  Neurological: Negative for headaches.  All other systems reviewed and are negative.    Physical Exam Updated Vital Signs BP 121/88 (BP Location: Left Arm)   Pulse 74   Temp 98.3 F (36.8 C) (Oral)   Resp 16   Ht 6\' 1"  (1.854 m)   Wt 89 kg   SpO2 97%   BMI 25.89 kg/m   Physical Exam    Constitutional: He is oriented to person, place, and time. He appears well-developed and well-nourished. No distress.  HENT:  Head: Normocephalic and atraumatic.  Mouth/Throat: Oropharynx is clear and moist.  Eyes: Pupils are equal, round, and reactive to light. Conjunctivae and EOM are normal.  Neck: Normal range of motion. Neck supple.  Cardiovascular: Normal rate, regular rhythm and normal heart sounds.  Pulmonary/Chest: Effort normal and breath sounds normal. No respiratory distress.  Abdominal: Soft. He exhibits no distension. There is no tenderness.  Musculoskeletal: Normal range of motion. He exhibits no edema or deformity.  Neurological: He is alert and oriented to person, place, and time.  Skin: Skin is warm and dry.  Psychiatric: He has a normal mood and affect.  Nursing note and vitals reviewed.    ED Treatments / Results  Labs (all labs ordered are listed, but only abnormal results are displayed) Labs Reviewed - No data to display  EKG EKG Interpretation  Date/Time:  Friday July 16 2018 20:12:24 EST Ventricular Rate:  75 PR Interval:    QRS Duration: 102 QT Interval:  404 QTC Calculation: 452 R Axis:   88 Text Interpretation:  Sinus rhythm RSR' in V1 or V2, right VCD or RVH Confirmed by Dene Gentry [phone]) on 07/16/2018 8:27:09 PM   Radiology No results found.  Procedures Procedures (including critical care time)  Medications Ordered in ED Medications - No data to display   Initial Impression / Assessment and Plan / ED Course  I have reviewed the triage vital signs and the nursing notes.  Pertinent labs & imaging results that were available during my care of the patient were reviewed by me and considered in my medical decision making (see chart for details).     MDM  Screen complete  Patient is presenting allegedly for evaluation of chronic back pain.  Upon my evaluation he appears to be mildly somnolent.  This is most likely secondary to  recent use of benzodiazepines.  Patient is not endorsing suicidal ideation.  He does not appear to be acutely uncomfortable from his reported chronic back pain.  Of note the patient had a very similar presentation earlier this month with a negative work-up.  Following a period of observation in the ED he appears comfortable.  He appears to be safe for discharge.    He understands the need for close follow-up.  Strict return precautions given and understood.   Final Clinical Impressions(s) / ED Diagnoses   Final diagnoses:  Chronic low back pain, unspecified back pain laterality, unspecified whether sciatica present    ED Discharge Orders    None       Valarie Merino, MD 07/16/18 2232

## 2018-07-16 NOTE — ED Notes (Signed)
Pt's mother agreed to come get pt. Pt continues to be difficult to arouse. Pt does open eyes to loud verbal stimuli.

## 2018-07-17 NOTE — ED Notes (Signed)
Pt verbalized understanding of d/c instructions. Pts mother updated on d/c instructions upon arrival.

## 2018-08-28 ENCOUNTER — Encounter (HOSPITAL_COMMUNITY): Payer: Self-pay | Admitting: Emergency Medicine

## 2018-08-28 ENCOUNTER — Emergency Department (HOSPITAL_COMMUNITY): Payer: Medicare Other

## 2018-08-28 ENCOUNTER — Other Ambulatory Visit: Payer: Self-pay

## 2018-08-28 ENCOUNTER — Inpatient Hospital Stay (HOSPITAL_COMMUNITY)
Admission: EM | Admit: 2018-08-28 | Discharge: 2018-09-06 | DRG: 917 | Payer: Medicare Other | Attending: Internal Medicine | Admitting: Internal Medicine

## 2018-08-28 DIAGNOSIS — S86021A Laceration of right Achilles tendon, initial encounter: Secondary | ICD-10-CM

## 2018-08-28 DIAGNOSIS — M549 Dorsalgia, unspecified: Secondary | ICD-10-CM | POA: Diagnosis present

## 2018-08-28 DIAGNOSIS — M25569 Pain in unspecified knee: Secondary | ICD-10-CM | POA: Diagnosis present

## 2018-08-28 DIAGNOSIS — R001 Bradycardia, unspecified: Secondary | ICD-10-CM | POA: Diagnosis present

## 2018-08-28 DIAGNOSIS — T402X2A Poisoning by other opioids, intentional self-harm, initial encounter: Secondary | ICD-10-CM | POA: Diagnosis present

## 2018-08-28 DIAGNOSIS — T50901A Poisoning by unspecified drugs, medicaments and biological substances, accidental (unintentional), initial encounter: Secondary | ICD-10-CM | POA: Diagnosis present

## 2018-08-28 DIAGNOSIS — R4182 Altered mental status, unspecified: Secondary | ICD-10-CM | POA: Diagnosis present

## 2018-08-28 DIAGNOSIS — T40604A Poisoning by unspecified narcotics, undetermined, initial encounter: Secondary | ICD-10-CM | POA: Diagnosis not present

## 2018-08-28 DIAGNOSIS — Z915 Personal history of self-harm: Secondary | ICD-10-CM

## 2018-08-28 DIAGNOSIS — J9601 Acute respiratory failure with hypoxia: Secondary | ICD-10-CM | POA: Diagnosis not present

## 2018-08-28 DIAGNOSIS — I4519 Other right bundle-branch block: Secondary | ICD-10-CM | POA: Diagnosis present

## 2018-08-28 DIAGNOSIS — R45851 Suicidal ideations: Secondary | ICD-10-CM | POA: Diagnosis present

## 2018-08-28 DIAGNOSIS — Z8249 Family history of ischemic heart disease and other diseases of the circulatory system: Secondary | ICD-10-CM

## 2018-08-28 DIAGNOSIS — I4581 Long QT syndrome: Secondary | ICD-10-CM | POA: Diagnosis present

## 2018-08-28 DIAGNOSIS — I509 Heart failure, unspecified: Secondary | ICD-10-CM | POA: Diagnosis present

## 2018-08-28 DIAGNOSIS — G8929 Other chronic pain: Secondary | ICD-10-CM | POA: Diagnosis present

## 2018-08-28 DIAGNOSIS — F411 Generalized anxiety disorder: Secondary | ICD-10-CM | POA: Diagnosis present

## 2018-08-28 DIAGNOSIS — Y92007 Garden or yard of unspecified non-institutional (private) residence as the place of occurrence of the external cause: Secondary | ICD-10-CM | POA: Diagnosis not present

## 2018-08-28 DIAGNOSIS — R579 Shock, unspecified: Secondary | ICD-10-CM | POA: Diagnosis not present

## 2018-08-28 DIAGNOSIS — G92 Toxic encephalopathy: Secondary | ICD-10-CM | POA: Diagnosis present

## 2018-08-28 DIAGNOSIS — I952 Hypotension due to drugs: Secondary | ICD-10-CM | POA: Diagnosis present

## 2018-08-28 DIAGNOSIS — T50904A Poisoning by unspecified drugs, medicaments and biological substances, undetermined, initial encounter: Secondary | ICD-10-CM | POA: Diagnosis not present

## 2018-08-28 DIAGNOSIS — M47812 Spondylosis without myelopathy or radiculopathy, cervical region: Secondary | ICD-10-CM | POA: Diagnosis present

## 2018-08-28 DIAGNOSIS — J449 Chronic obstructive pulmonary disease, unspecified: Secondary | ICD-10-CM | POA: Diagnosis present

## 2018-08-28 DIAGNOSIS — G4700 Insomnia, unspecified: Secondary | ICD-10-CM | POA: Diagnosis present

## 2018-08-28 DIAGNOSIS — S0081XA Abrasion of other part of head, initial encounter: Secondary | ICD-10-CM | POA: Diagnosis present

## 2018-08-28 DIAGNOSIS — Z9114 Patient's other noncompliance with medication regimen: Secondary | ICD-10-CM

## 2018-08-28 DIAGNOSIS — F17210 Nicotine dependence, cigarettes, uncomplicated: Secondary | ICD-10-CM | POA: Diagnosis present

## 2018-08-28 DIAGNOSIS — F250 Schizoaffective disorder, bipolar type: Secondary | ICD-10-CM | POA: Diagnosis not present

## 2018-08-28 DIAGNOSIS — F200 Paranoid schizophrenia: Secondary | ICD-10-CM | POA: Diagnosis present

## 2018-08-28 DIAGNOSIS — T50904D Poisoning by unspecified drugs, medicaments and biological substances, undetermined, subsequent encounter: Secondary | ICD-10-CM | POA: Diagnosis not present

## 2018-08-28 DIAGNOSIS — T782XXA Anaphylactic shock, unspecified, initial encounter: Secondary | ICD-10-CM | POA: Diagnosis present

## 2018-08-28 DIAGNOSIS — T424X2A Poisoning by benzodiazepines, intentional self-harm, initial encounter: Principal | ICD-10-CM | POA: Diagnosis present

## 2018-08-28 DIAGNOSIS — R402422 Glasgow coma scale score 9-12, at arrival to emergency department: Secondary | ICD-10-CM | POA: Diagnosis present

## 2018-08-28 DIAGNOSIS — W2209XA Striking against other stationary object, initial encounter: Secondary | ICD-10-CM | POA: Diagnosis present

## 2018-08-28 DIAGNOSIS — Z79899 Other long term (current) drug therapy: Secondary | ICD-10-CM

## 2018-08-28 DIAGNOSIS — J9602 Acute respiratory failure with hypercapnia: Secondary | ICD-10-CM | POA: Diagnosis present

## 2018-08-28 DIAGNOSIS — T391X2A Poisoning by 4-Aminophenol derivatives, intentional self-harm, initial encounter: Secondary | ICD-10-CM | POA: Diagnosis not present

## 2018-08-28 DIAGNOSIS — E872 Acidosis: Secondary | ICD-10-CM | POA: Diagnosis present

## 2018-08-28 DIAGNOSIS — F05 Delirium due to known physiological condition: Secondary | ICD-10-CM | POA: Diagnosis not present

## 2018-08-28 DIAGNOSIS — J9600 Acute respiratory failure, unspecified whether with hypoxia or hypercapnia: Secondary | ICD-10-CM

## 2018-08-28 DIAGNOSIS — Z888 Allergy status to other drugs, medicaments and biological substances status: Secondary | ICD-10-CM

## 2018-08-28 DIAGNOSIS — T1491XA Suicide attempt, initial encounter: Secondary | ICD-10-CM | POA: Diagnosis not present

## 2018-08-28 DIAGNOSIS — Z781 Physical restraint status: Secondary | ICD-10-CM

## 2018-08-28 DIAGNOSIS — Z880 Allergy status to penicillin: Secondary | ICD-10-CM

## 2018-08-28 DIAGNOSIS — Z886 Allergy status to analgesic agent status: Secondary | ICD-10-CM

## 2018-08-28 LAB — GLUCOSE, CAPILLARY: Glucose-Capillary: 102 mg/dL — ABNORMAL HIGH (ref 70–99)

## 2018-08-28 LAB — I-STAT CHEM 8, ED
BUN: 4 mg/dL — ABNORMAL LOW (ref 6–20)
Calcium, Ion: 1.22 mmol/L (ref 1.15–1.40)
Chloride: 103 mmol/L (ref 98–111)
Creatinine, Ser: 0.8 mg/dL (ref 0.61–1.24)
Glucose, Bld: 129 mg/dL — ABNORMAL HIGH (ref 70–99)
HCT: 49 % (ref 39.0–52.0)
Hemoglobin: 16.7 g/dL (ref 13.0–17.0)
Potassium: 3.6 mmol/L (ref 3.5–5.1)
Sodium: 141 mmol/L (ref 135–145)
TCO2: 29 mmol/L (ref 22–32)

## 2018-08-28 LAB — CREATININE, SERUM
Creatinine, Ser: 0.92 mg/dL (ref 0.61–1.24)
GFR calc Af Amer: >60 mL/min (ref >60)
GFR calc non Af Amer: >60 mL/min (ref >60)

## 2018-08-28 LAB — CBC
HCT: 48.7 % (ref 39.0–52.0)
HEMATOCRIT: 49.1 % (ref 39.0–52.0)
Hemoglobin: 15.7 g/dL (ref 13.0–17.0)
Hemoglobin: 16 g/dL (ref 13.0–17.0)
MCH: 28.7 pg (ref 26.0–34.0)
MCH: 29.6 pg (ref 26.0–34.0)
MCHC: 32 g/dL (ref 30.0–36.0)
MCHC: 32.9 g/dL (ref 30.0–36.0)
MCV: 89.8 fL (ref 80.0–100.0)
MCV: 90 fL (ref 80.0–100.0)
Platelets: 197 10*3/uL (ref 150–400)
Platelets: 210 10*3/uL (ref 150–400)
RBC: 5.41 MIL/uL (ref 4.22–5.81)
RBC: 5.47 MIL/uL (ref 4.22–5.81)
RDW: 12.1 % (ref 11.5–15.5)
RDW: 12.2 % (ref 11.5–15.5)
WBC: 11.2 10*3/uL — AB (ref 4.0–10.5)
WBC: 11.2 10*3/uL — ABNORMAL HIGH (ref 4.0–10.5)
nRBC: 0 % (ref 0.0–0.2)
nRBC: 0 % (ref 0.0–0.2)

## 2018-08-28 LAB — CK: CK TOTAL: 233 U/L (ref 49–397)

## 2018-08-28 LAB — I-STAT VENOUS BLOOD GAS, ED
Acid-base deficit: 1 mmol/L (ref 0.0–2.0)
Bicarbonate: 30 mmol/L — ABNORMAL HIGH (ref 20.0–28.0)
O2 Saturation: 30 %
PO2 VEN: 24 mmHg — AB (ref 32.0–45.0)
TCO2: 32 mmol/L (ref 22–32)
pCO2, Ven: 75.9 mmHg (ref 44.0–60.0)
pH, Ven: 7.205 — ABNORMAL LOW (ref 7.250–7.430)

## 2018-08-28 LAB — I-STAT ARTERIAL BLOOD GAS, ED
ACID-BASE EXCESS: 1 mmol/L (ref 0.0–2.0)
Bicarbonate: 28 mmol/L (ref 20.0–28.0)
O2 SAT: 90 %
PCO2 ART: 54.4 mmHg — AB (ref 32.0–48.0)
PO2 ART: 66 mmHg — AB (ref 83.0–108.0)
TCO2: 30 mmol/L (ref 22–32)
pH, Arterial: 7.319 — ABNORMAL LOW (ref 7.350–7.450)

## 2018-08-28 LAB — URINALYSIS, ROUTINE W REFLEX MICROSCOPIC
Bilirubin Urine: NEGATIVE
Glucose, UA: NEGATIVE mg/dL
Hgb urine dipstick: NEGATIVE
Ketones, ur: NEGATIVE mg/dL
Leukocytes, UA: NEGATIVE
Nitrite: NEGATIVE
Protein, ur: NEGATIVE mg/dL
Specific Gravity, Urine: 1.015 (ref 1.005–1.030)
pH: 5.5 (ref 5.0–8.0)

## 2018-08-28 LAB — COMPREHENSIVE METABOLIC PANEL
ALT: 20 U/L (ref 0–44)
AST: 26 U/L (ref 15–41)
Albumin: 4.1 g/dL (ref 3.5–5.0)
Alkaline Phosphatase: 45 U/L (ref 38–126)
Anion gap: 11 (ref 5–15)
BUN: <5 mg/dL — ABNORMAL LOW (ref 6–20)
CO2: 27 mmol/L (ref 22–32)
Calcium: 9.5 mg/dL (ref 8.9–10.3)
Chloride: 103 mmol/L (ref 98–111)
Creatinine, Ser: 1 mg/dL (ref 0.61–1.24)
GFR calc Af Amer: >60 mL/min (ref >60)
GFR calc non Af Amer: >60 mL/min (ref >60)
Glucose, Bld: 127 mg/dL — ABNORMAL HIGH (ref 70–99)
Potassium: 3.7 mmol/L (ref 3.5–5.1)
SODIUM: 141 mmol/L (ref 135–145)
Total Bilirubin: 0.7 mg/dL (ref 0.3–1.2)
Total Protein: 7.1 g/dL (ref 6.5–8.1)

## 2018-08-28 LAB — LACTIC ACID, PLASMA
Lactic Acid, Venous: 1.1 mmol/L (ref 0.5–1.9)
Lactic Acid, Venous: 1.4 mmol/L (ref 0.5–1.9)

## 2018-08-28 LAB — CBC WITH DIFFERENTIAL/PLATELET
ABS IMMATURE GRANULOCYTES: 0.05 10*3/uL (ref 0.00–0.07)
Basophils Absolute: 0.1 10*3/uL (ref 0.0–0.1)
Basophils Relative: 1 %
Eosinophils Absolute: 0 10*3/uL (ref 0.0–0.5)
Eosinophils Relative: 0 %
HCT: 50 % (ref 39.0–52.0)
Hemoglobin: 16.1 g/dL (ref 13.0–17.0)
Immature Granulocytes: 0 %
LYMPHS ABS: 1.1 10*3/uL (ref 0.7–4.0)
LYMPHS PCT: 10 %
MCH: 29.5 pg (ref 26.0–34.0)
MCHC: 32.2 g/dL (ref 30.0–36.0)
MCV: 91.7 fL (ref 80.0–100.0)
Monocytes Absolute: 0.5 10*3/uL (ref 0.1–1.0)
Monocytes Relative: 4 %
NEUTROS ABS: 10.1 10*3/uL — AB (ref 1.7–7.7)
Neutrophils Relative %: 85 %
Platelets: 220 10*3/uL (ref 150–400)
RBC: 5.45 MIL/uL (ref 4.22–5.81)
RDW: 12.2 % (ref 11.5–15.5)
WBC: 11.8 10*3/uL — ABNORMAL HIGH (ref 4.0–10.5)
nRBC: 0 % (ref 0.0–0.2)

## 2018-08-28 LAB — PHOSPHORUS: Phosphorus: 2.8 mg/dL (ref 2.5–4.6)

## 2018-08-28 LAB — CBG MONITORING, ED
Glucose-Capillary: 114 mg/dL — ABNORMAL HIGH (ref 70–99)
Glucose-Capillary: 106 mg/dL — ABNORMAL HIGH (ref 70–99)

## 2018-08-28 LAB — RAPID URINE DRUG SCREEN, HOSP PERFORMED
Amphetamines: NOT DETECTED
Barbiturates: NOT DETECTED
Benzodiazepines: POSITIVE — AB
Cocaine: NOT DETECTED
Opiates: POSITIVE — AB
Tetrahydrocannabinol: NOT DETECTED

## 2018-08-28 LAB — ETHANOL: Alcohol, Ethyl (B): <10 mg/dL (ref <10)

## 2018-08-28 LAB — MRSA PCR SCREENING: MRSA by PCR: NEGATIVE

## 2018-08-28 LAB — MAGNESIUM: Magnesium: 2.2 mg/dL (ref 1.7–2.4)

## 2018-08-28 LAB — I-STAT CG4 LACTIC ACID, ED: Lactic Acid, Venous: 2.29 mmol/L (ref 0.5–1.9)

## 2018-08-28 LAB — ACETAMINOPHEN LEVEL: Acetaminophen (Tylenol), Serum: <10 ug/mL — ABNORMAL LOW (ref 10–30)

## 2018-08-28 LAB — PROCALCITONIN: Procalcitonin: <0.1 ng/mL

## 2018-08-28 MED ORDER — LACTATED RINGERS IV SOLN
INTRAVENOUS | Status: DC
  Administered 2018-08-28 – 2018-08-30 (×4): via INTRAVENOUS

## 2018-08-28 MED ORDER — SODIUM CHLORIDE 0.9 % IV SOLN
Freq: Once | INTRAVENOUS | Status: AC
  Administered 2018-08-28: 09:00:00 via INTRAVENOUS

## 2018-08-28 MED ORDER — NALOXONE HCL 0.4 MG/ML IJ SOLN
0.4000 mg | Freq: Once | INTRAMUSCULAR | Status: AC
  Administered 2018-08-28: 0.4 mg via INTRAVENOUS
  Filled 2018-08-28: qty 1

## 2018-08-28 MED ORDER — NALOXONE HCL 0.4 MG/ML IJ SOLN
0.4000 mg | Freq: Once | INTRAMUSCULAR | Status: AC
  Administered 2018-08-28: 0.4 mg via INTRAVENOUS

## 2018-08-28 MED ORDER — NALOXONE HCL 0.4 MG/ML IJ SOLN
0.1000 mg | INTRAMUSCULAR | Status: DC | PRN
  Administered 2018-08-28: 0.3 mg via INTRAVENOUS
  Filled 2018-08-28 (×2): qty 1

## 2018-08-28 MED ORDER — SODIUM CHLORIDE 0.9 % IV BOLUS
1000.0000 mL | Freq: Once | INTRAVENOUS | Status: AC
  Administered 2018-08-28: 1000 mL via INTRAVENOUS

## 2018-08-28 MED ORDER — DOPAMINE-DEXTROSE 3.2-5 MG/ML-% IV SOLN
0.0000 ug/kg/min | INTRAVENOUS | Status: DC
  Administered 2018-08-28: 5 ug/kg/min via INTRAVENOUS
  Administered 2018-08-29: 7.5 ug/kg/min via INTRAVENOUS
  Filled 2018-08-28 (×2): qty 250

## 2018-08-28 MED ORDER — ENOXAPARIN SODIUM 40 MG/0.4ML ~~LOC~~ SOLN
40.0000 mg | SUBCUTANEOUS | Status: DC
  Administered 2018-08-28: 40 mg via SUBCUTANEOUS
  Filled 2018-08-28 (×5): qty 0.4

## 2018-08-28 MED ORDER — ONDANSETRON HCL 4 MG/2ML IJ SOLN
4.0000 mg | Freq: Once | INTRAMUSCULAR | Status: AC
  Administered 2018-08-28: 4 mg via INTRAVENOUS
  Filled 2018-08-28: qty 2

## 2018-08-28 MED ORDER — SODIUM CHLORIDE 0.9 % IV SOLN: INTRAVENOUS | Status: DC | PRN

## 2018-08-28 NOTE — Progress Notes (Signed)
CSW received consult for advanced directive. CSW does not prepare advanced directives as that is chaplain services. CSW recommends chaplain consult. CSW will continue to follow at this time if social work needs arise.   Lamonte Richer, LCSW, [place] Worker II [phone]

## 2018-08-28 NOTE — Progress Notes (Signed)
Patient ID: [name], male   DOB: 10-27-1970, 47 y.o.   MRN: [id number] I was able to speak with the ED team and find out about there assessment and treatment for this patient's right leg injury.  There is an appropriate dressing and splint on the leg and the wound was appropriately cleaned and the soft-tissue re-approximated.  No further surgery needs to be done in this setting urgently.  However, it does sound like he will need close follow-up.  His compliance following a repair of a ruptured Achilles will be questionable however.

## 2018-08-28 NOTE — ED Notes (Signed)
Patient transported to X-ray 

## 2018-08-28 NOTE — ED Notes (Signed)
Level 2 canceled verbally by EDP [name] Nine).

## 2018-08-28 NOTE — ED Notes (Signed)
Pt mumbling unknown words then fell asleep  bp low  Dopamine increased to 56mcg

## 2018-08-28 NOTE — ED Provider Notes (Signed)
I assumed care of this patient from Dr. [name] Nine at 7 am.  Please see their note for further details of Hx, PE.  Briefly patient is a 47 y.o. male who presented with AMS, right ankle laceration and concern for achilles injury. Suspect polysubstance abuse, hx of same. Alcohol wnl. Patient with pinpoint pupils, decreased respiratory rate and concern for opiod OD. Patient maintaining own airway. F/u head and neck imaging and metabolize. Patient to get plantarflexion splint and f/u with ortho who was consulted/will give keflex/crutches.   Current plan is to MTF, follow up imaging.   Patient with unremarkable head CT and neck CT.  Patient continued to be hypotensive, bradycardic.  0.3 mg of Narcan was slowly titrated to mild effect.  Patient had improvement in vitals following Narcan.  Patient continued to have hypotensive and bradycardic again, additional saline bolus was ordered.  Additional 0.4 of Narcan was given that had good effect on vitals.   Patient became somnolent again, hypotensive, bradycardic.  At that time 0.8 of Narcan was given without relief.  Talked with poison control after finding out that patient likely overdosed with Klonopin which she has done before in the past.  Patient states when he is arousable that he did take some Ativan.  However totally unreliable.  Given that Narcan has not had a fact poison control recommends dopamine infusion and fluids.  Recommend against Narcan drip as he has not had a effect from it.  Likely benzodiazepine overdose.  Requiring IV pressors.  Will contact ICU for admission.  By the time ICU was contacted and evaluated the patient he appears to have had neurologic improvement, hemodynamic improvement.  Weaning dopamine. Will try to get off pressors for SDU admission.  Upon re-evaluation after dopamine was stopped patient became hypotensive again.  Dopamine was restarted patient to be admitted to ICU for further care.  Suspect symptoms largely from benzodiazepine  overdose.  Repeat blood sugars within normal system.  Will repeat lactic acid and CBC and ICU doctor to follow-up.  .Critical Care Performed by: Lennice Sites, DO Authorized by: Lennice Sites, DO   Critical care provider statement:    Critical care time (minutes):  70   Critical care time was exclusive of:  Separately billable procedures and treating other patients and teaching time   Critical care was necessary to treat or prevent imminent or life-threatening deterioration of the following conditions:  CNS failure or compromise   Critical care was time spent personally by me on the following activities:  Development of treatment plan with patient or surrogate, discussions with primary provider, discussions with consultants, evaluation of patient's response to treatment, examination of patient, obtaining history from patient or surrogate, ordering and performing treatments and interventions, ordering and review of laboratory studies, ordering and review of radiographic studies, pulse oximetry, re-evaluation of patient's condition and review of old charts   I assumed direction of critical care for this patient from another provider in my specialty: yes         Lennice Sites, DO 08/28/18 1554

## 2018-08-28 NOTE — ED Provider Notes (Signed)
[facility] EMERGENCY DEPARTMENT Provider Note   CSN: [id number] Arrival date & time: 08/28/18  0555     History   Chief Complaint Chief Complaint  Patient presents with  . Altered Mental Status    HPI [name] is a 47 y.o. male.  47 yo M with a chief complaint of unresponsiveness.  The patient was found slumped down in her yard.  Police estimate that he was down for about 3 hours at least.  Found to be extremely cold but would respond to pain and would have limited discussions before falling back to sleep by EMS.  Noted to have a very large laceration to the right posterior lower leg.  Small abrasion to the forehead.  No other noted injuries by EMS.  Level 5 caveat altered mental status.  The history is provided by the EMS personnel and the patient.  Altered Mental Status   This is a recurrent problem. The current episode started 3 to 5 hours ago. The problem has not changed since onset.Associated symptoms include confusion and somnolence. Risk factors include alcohol intake and illicit drug use. His past medical history is significant for psychotropic medication treatment.    Past Medical History:  Diagnosis Date  . Asthma   . CHF (congestive heart failure) ([place])   . Chronic back pain   . COPD (chronic obstructive pulmonary disease) ([place])   . Knee pain, chronic   . Migraines   . Schizophrenia, schizo-affective [facility])     Patient Active Problem List   Diagnosis Date Noted  . Suicide attempt ([place])   . Benzodiazepine (tranquilizer) overdose, intentional self-harm, initial encounter ([place]) 06/13/2018  . Overdose of benzodiazepine, intentional self-harm, initial encounter ([place])   . Somnolence   . Schizophrenia, paranoid type ([place]) 08/19/2017  . Hypokalemia 04/28/2014  . Drug overdose 04/27/2014  . Schizoaffective disorder, bipolar type ([place]) 03/02/2014  . Anxiety state 03/02/2014  . Benzodiazepine dependence ([place]) 03/02/2014  . Chest pain 10/11/2013    . EKG abnormalities 10/11/2013  . Chronic back pain     Past Surgical History:  Procedure Laterality Date  . extraction of wisdom teeth    . KNEE SURGERY     four times        Home Medications    Prior to Admission medications   Medication Sig Start Date End Date Taking? Authorizing Provider  doxepin (SINEQUAN) 50 MG capsule Take 50 mg by mouth at bedtime. 06/22/18   [provider]  NORCO 10-325 MG tablet Take 0.5-1 tablets by mouth 2 (two) times daily as needed for pain. 05/20/18   [provider]  OLANZapine (ZYPREXA) 5 MG tablet Take 5 mg by mouth at bedtime.    [provider]    Family History Family History  Problem Relation Age of Onset  . Hypertension Father     Social History Social History   Tobacco Use  . Smoking status: Current Every Day Smoker    Packs/day: 2.00    Years: 10.00    Pack years: 20.00    Types: Cigarettes  . Smokeless tobacco: Never Used  Substance Use Topics  . Alcohol use: No    Comment: occ  . Drug use: No     Allergies   Amoxicillin; Dextromethorphan-guaifenesin; Haloperidol; Meloxicam; Nsaids; Prednisone; Quetiapine; Sudafed [pseudoephedrine hcl]; and Ziprasidone   Review of Systems Review of Systems  Unable to perform ROS: Mental status change  Constitutional: Negative for chills and fever.  HENT: Negative for congestion  and facial swelling.   Eyes: Negative for discharge and visual disturbance.  Respiratory: Negative for shortness of breath.   Cardiovascular: Negative for chest pain and palpitations.  Gastrointestinal: Negative for abdominal pain, diarrhea and vomiting.  Musculoskeletal: Negative for arthralgias and myalgias.  Skin: Negative for color change and rash.  Neurological: Negative for tremors, syncope and headaches.  Psychiatric/Behavioral: Positive for confusion. Negative for dysphoric mood.     Physical Exam Updated Vital Signs BP (!) 80/56   Pulse (!) 56   Temp (!) 97.4  F (36.3 C) (Oral)   Resp 10   Ht 6\' 1"  (1.854 m)   Wt 89 kg   SpO2 98%   BMI 25.89 kg/m   Physical Exam Vitals signs and nursing note reviewed.  Constitutional:      Appearance: He is well-developed.  HENT:     Head: Normocephalic.   Eyes:     Pupils: Pupils are equal, round, and reactive to light.     Comments: Pinpoint pupils  Neck:     Musculoskeletal: Normal range of motion and neck supple.     Vascular: No JVD.  Cardiovascular:     Rate and Rhythm: Normal rate and regular rhythm.     Heart sounds: No murmur. No friction rub. No gallop.   Pulmonary:     Effort: Bradypnea present. No respiratory distress.     Breath sounds: No wheezing.  Abdominal:     General: There is no distension.     Tenderness: There is no guarding or rebound.  Musculoskeletal: Normal range of motion.       Legs:     Comments: No signs of trauma to the back, palpated along the midline without deformity or step-offs  Skin:    Coloration: Skin is not pale.     Findings: No rash.     Comments: Bilateral lower extremities very cold to touch  Neurological:     Mental Status: He is alert and oriented to person, place, and time.  Psychiatric:        Behavior: Behavior normal.      ED Treatments / Results  Labs (all labs ordered are listed, but only abnormal results are displayed) Labs Reviewed  CBC WITH DIFFERENTIAL/PLATELET - Abnormal; Notable for the following components:      Result Value   WBC 11.8 (*)    Neutro Abs 10.1 (*)    All other components within normal limits  CBG MONITORING, ED - Abnormal; Notable for the following components:   Glucose-Capillary 114 (*)    All other components within normal limits  I-STAT CHEM 8, ED - Abnormal; Notable for the following components:   BUN 4 (*)    Glucose, Bld 129 (*)    All other components within normal limits  I-STAT CG4 LACTIC ACID, ED - Abnormal; Notable for the following components:   Lactic Acid, Venous 2.29 (*)    All other  components within normal limits  I-STAT VENOUS BLOOD GAS, ED - Abnormal; Notable for the following components:   pH, Ven 7.205 (*)    pCO2, Ven 75.9 (*)    pO2, Ven 24.0 (*)    Bicarbonate 30.0 (*)    All other components within normal limits  URINE CULTURE  CK  ETHANOL  URINALYSIS, ROUTINE W REFLEX MICROSCOPIC  COMPREHENSIVE METABOLIC PANEL    EKG EKG Interpretation  Date/Time:  Saturday August 28 2018 06:00:05 EST Ventricular Rate:  82 PR Interval:    QRS Duration: 118 QT  Interval:  409 QTC Calculation: 478 R Axis:   81 Text Interpretation:  Sinus rhythm Incomplete right bundle branch block Probable left ventricular hypertrophy Borderline prolonged QT interval No significant change since last tracing Confirmed by Deno Etienne [phone]) on 08/28/2018 6:08:27 AM   Radiology No results found.  Procedures .Marland KitchenLaceration Repair Date/Time: 08/28/2018 6:55 AM Performed by: Deno Etienne, DO Authorized by: Deno Etienne, DO   Consent:    Consent obtained:  Emergent situation   Consent given by:  Patient   Risks discussed:  Infection, pain, poor cosmetic result and poor wound healing   Alternatives discussed:  No treatment Anesthesia (see MAR for exact dosages):    Anesthesia method:  None Laceration details:    Location:  Leg   Leg location:  R lower leg   Length (cm):  8 Repair type:    Repair type:  Simple Pre-procedure details:    Preparation:  Patient was prepped and draped in usual sterile fashion Exploration:    Hemostasis achieved with:  Direct pressure   Wound exploration: entire depth of wound probed and visualized     Wound extent: tendon damage     Tendon damage location:  Lower extremity   Lower extremity tendon damage location:  Right achilles   Tendon damage extent:  Complete transection   Tendon repair plan:  Refer for evaluation   Contaminated: no   Treatment:    Area cleansed with:  Betadine and saline   Amount of cleaning:  Extensive   Irrigation  solution:  Sterile saline   Irrigation volume:  1500   Irrigation method:  Pressure wash   Visualized foreign bodies/material removed: no   Skin repair:    Repair method:  Sutures   Suture size:  3-0   Suture material:  Nylon   Suture technique:  Simple interrupted   Number of sutures:  6 Approximation:    Approximation:  Close Post-procedure details:    Dressing:  Bulky dressing   Patient tolerance of procedure:  Tolerated well, no immediate complications   (including critical care time)  Medications Ordered in ED Medications  sodium chloride 0.9 % bolus 1,000 mL (1,000 mLs Intravenous New Bag/Given 08/28/18 0617)     Initial Impression / Assessment and Plan / ED Course  I have reviewed the triage vital signs and the nursing notes.  Pertinent labs & imaging results that were available during my care of the patient were reviewed by me and considered in my medical decision making (see chart for details).     47 yo M with a chief complaint of altered mental status.  Patient has been seen in the ED multiple times with intentional benzodiazepine overdose.  This may be the cause of his symptoms though seems more likely to be an opiate overdose with pinpoint pupils, and decreased respiratory rate.  He has likely Achilles transection.  Will CT the head and C-spine as the patient is altered.  No other signs of trauma.  I discussed the case with Dr. Erlinda Hong, orthopedics he recommended thoroughly cleaning out the wound and suturing just through the skin and placing the patient in a Lanter flexion splint and given crutches a week of Keflex and follow-up with him in the office.  Patient found to be hypercarbic likely secondary to hypoventilation due to opiate overdose.  Urine is negative for infection.  Lactate was mildly elevated 2.2.  Mild leukocytosis 11.8.  CK is normal.  Awaiting head CT and chest x-ray.  Will likely need  to evaluate until the patient is sober and then will have him follow-up  with his PCP orthopedics.  Case was discussed with Dr. Ronnald Nian, please see his note for further details of care in the ED.  CRITICAL CARE Performed by: Cecilio Asper   Total critical care time: 35 minutes  Critical care time was exclusive of separately billable procedures and treating other patients.  Critical care was necessary to treat or prevent imminent or life-threatening deterioration.  Critical care was time spent personally by me on the following activities: development of treatment plan with patient and/or surrogate as well as nursing, discussions with consultants, evaluation of patient's response to treatment, examination of patient, obtaining history from patient or surrogate, ordering and performing treatments and interventions, ordering and review of laboratory studies, ordering and review of radiographic studies, pulse oximetry and re-evaluation of patient's condition.  The patients results and plan were reviewed and discussed.   Any x-rays performed were independently reviewed by myself.   Differential diagnosis were considered with the presenting HPI.  Medications  sodium chloride 0.9 % bolus 1,000 mL (1,000 mLs Intravenous New Bag/Given 08/28/18 0617)    Vitals:   08/28/18 0609 08/28/18 0615 08/28/18 0630 08/28/18 0704  BP:  94/70 (!) 83/63 (!) 80/56  Pulse:  (!) 59 (!) 57 (!) 56  Resp:  (!) 8 (!) 6 10  Temp:      TempSrc:      SpO2:  95% 90% 98%  Weight: 89 kg     Height: 6\' 1"  (1.854 m)       Final diagnoses:  Drug overdose, undetermined intent, initial encounter  Opiate overdose, undetermined intent, initial encounter ([place])  Laceration of Achilles tendon, right, initial encounter    Admission/ observation were discussed with the admitting physician, patient and/or family and they are comfortable with the plan.    Final Clinical Impressions(s) / ED Diagnoses   Final diagnoses:  Drug overdose, undetermined intent, initial encounter  Opiate  overdose, undetermined intent, initial encounter [facility])  Laceration of Achilles tendon, right, initial encounter    ED Discharge Orders    None       Deno Etienne, DO 08/28/18 0719

## 2018-08-28 NOTE — H&P (Signed)
NAME:  [name], MRN:  [id number], DOB:  1971-08-29, LOS: 0 ADMISSION DATE:  08/28/2018, CONSULTATION DATE:  08/28/18 REFERRING MD:  ER, CHIEF COMPLAINT:  HYpotension   Brief History   47 y.o. male admitted today through the er for hypotension and bradycardia. Toxicology screen positive for benzodiazepine/narcotic  History of present illness   History is difficult to obtain due to patient's altered mental status. Further history obtained from the ER record: [name] Gaertner is a 47 y.o. male.  47 yo M with a chief complaint of unresponsiveness.  The patient was found slumped down in her yard.  Police estimate that he was down for about 3 hours at least.  Found to be extremely cold but would respond to pain and would have limited discussions before falling back to sleep by EMS.  Noted to have a very large laceration to the right posterior lower leg.  Small abrasion to the forehead.  No other noted injuries by EMS.  Level 5 caveat altered mental status.  The history is provided by the EMS personnel and the patient.  Altered Mental Status   This is a recurrent problem. The current episode started 3 to 5 hours ago. The problem has not changed since onset.Associated symptoms include confusion and somnolence. Risk factors include alcohol intake and illicit drug use. His past medical history is significant for psychotropic medication treatment.   Patient placed on dopamine after the ER doctor contacted poison control due to suspected drug overdose. No further information is available.  Past Medical History   . Asthma   . CHF (congestive heart failure) ([place])   . Chronic back pain   . COPD (chronic obstructive pulmonary disease) ([place])   . Knee pain, chronic   . Migraines   . Schizophrenia, schizo-affective ([place])      Significant Hospital Events   IV dopamine started. CT HEAD WO CONTRAST  Final Result    CT Cervical Spine Wo Contrast  Final Result    DG Chest 2 View  Final  Result     ECG: nsr, possible lvh, possible incomplete RBBB, QTc 478 msec  Consults:  Orthopedics  Procedures:  CT Head/C spine CXR  Significant Diagnostic Tests:  Urine toxicology screen + for benzodiazepines, opioids  Micro Data:  None  Antimicrobials:  None  Interim history/subjective:    Objective   Blood pressure (!) 74/56, pulse 70, temperature 98.4 F (36.9 C), resp. rate (!) 0, height 6\' 1"  (1.854 m), weight 89 kg, SpO2 98 %.        Intake/Output Summary (Last 24 hours) at 08/28/2018 1542 Last data filed at 08/28/2018 1149 Gross per 24 hour  Intake 2000.02 ml  Output -  Net 2000.02 ml   Filed Weights   08/28/18 0609  Weight: 89 kg    Examination: General: Awake, mumbling incomprehensible words. Sitting upright in bed. HENT: PERL, no icterus or JVD. Lungs: clear to auscultation. No wheezes, crackles or rhonchi. Cardiovascular: current pulse is 82, regular. Monitor reveals sinus rhythm. Normal s1s2. No murmur, gallop or rub. Abdomen: soft, non tender. No organomegaly/mass.   Extremities: warm to touch. +2 pulses peripherally. Right calf at the distal end but above the termination of the tendon to the foot reveals an approximate 4-5 inch laceration to the deep tissue with some oozing of blood. Approximately 4 superficial sutures noted. Neuro: GCS 11 GU: foley catheter in place.  Resolved Hospital Problem list     Assessment & Plan:  1. Acute drug  toxicity secondary to benzodiazepine/opioid. -supportive care. May still require intubation, mechanical ventilation  2. Acute respiratory failure with hypercapnia, most likely due to drug ingestion. - follow up abg's  3. Lactic acidosis likely due to acute hypercapnia - follow up lactate  4. Schizophrenia  5. Encephalopathy secondary to drug ingestion/hypercapnia - see above  6. Calf laceration - Dr. Erlinda Hong contacted earlier. Spoke with on call orthopedic surgeon, Dr. Kate Sable who will provide follow  up care and recommendations today.  Best practice:  Diet: npo until encephalopathy resolves Pain/Anxiety/Delirium protocol (if indicated):  VAP protocol (if indicated):  DVT prophylaxis: lmwh GI prophylaxis:  Glucose control: q4h blood glucose Mobility: bedrest Code Status: full Family Communication: Mother at the bedside. Does not know much about his health as he lives independently. Disposition: admit to the icu  Labs   CBC: Recent Labs  Lab 08/28/18 0610 08/28/18 0614  WBC 11.8*  --   NEUTROABS 10.1*  --   HGB 16.1 16.7  HCT 50.0 49.0  MCV 91.7  --   PLT 220  --     Basic Metabolic Panel: Recent Labs  Lab 08/28/18 0610 08/28/18 0614  NA 141 141  K 3.7 3.6  CL 103 103  CO2 27  --   GLUCOSE 127* 129*  BUN <5* 4*  CREATININE 1.00 0.80  CALCIUM 9.5  --    GFR: Estimated Creatinine Clearance: 126.2 mL/min (by C-G formula based on SCr of 0.8 mg/dL). Recent Labs  Lab 08/28/18 0610 08/28/18 0613  WBC 11.8*  --   LATICACIDVEN  --  2.29*    Liver Function Tests: Recent Labs  Lab 08/28/18 0610  AST 26  ALT 20  ALKPHOS 45  BILITOT 0.7  PROT 7.1  ALBUMIN 4.1   No results for input(s): LIPASE, AMYLASE in the last 168 hours. No results for input(s): AMMONIA in the last 168 hours.  ABG    Component Value Date/Time   PHART 7.391 01/02/2011 1934   PCO2ART 43.7 01/02/2011 1934   PO2ART 89.0 01/02/2011 1934   HCO3 30.0 (H) 08/28/2018 0619   TCO2 32 08/28/2018 0619   ACIDBASEDEF 1.0 08/28/2018 0619   O2SAT 30.0 08/28/2018 0619     Coagulation Profile: No results for input(s): INR, PROTIME in the last 168 hours.  Cardiac Enzymes: Recent Labs  Lab 08/28/18 0603  CKTOTAL 233    HbA1C: Hgb A1c MFr Bld  Date/Time Value Ref Range Status  06/25/2017 06:41 AM 5.5 4.8 - 5.6 % Final    Comment:    (NOTE) Pre diabetes:          5.7%-6.4% Diabetes:              >6.4% Glycemic control for   <7.0% adults with diabetes     CBG: Recent Labs  Lab  08/28/18 0614 08/28/18 0848  GLUCAP 114* 106*    Review of Systems:   n/a  Past Medical History  He,  has a past medical history of Asthma, CHF (congestive heart failure) ([facility]), Chronic back pain, COPD (chronic obstructive pulmonary disease) ([place]), Knee pain, chronic, Migraines, and Schizophrenia, schizo-affective ([place]).   Surgical History    Past Surgical History:  Procedure Laterality Date  . extraction of wisdom teeth    . KNEE SURGERY     four times     Social History   reports that he has been smoking cigarettes. He has a 20.00 pack-year smoking history. He has never used smokeless tobacco. He reports that he does  not drink alcohol or use drugs.   Family History   His family history includes Hypertension in his father.   Allergies Allergies  Allergen Reactions  . Amoxicillin Diarrhea    Has patient had a PCN reaction causing immediate rash, facial/tongue/throat swelling, SOB or lightheadedness with hypotension: No Has patient had a PCN reaction causing severe rash involving mucus membranes or skin necrosis: No Has patient had a PCN reaction that required hospitalization: No Has patient had a PCN reaction occurring within the last 10 years: No If all of the above answers are "NO", then may proceed with Cephalosporin use.   Marland Kitchen Dextromethorphan-Guaifenesin Other (See Comments)    unknown  . Haloperidol Other (See Comments)    unspecified  . Meloxicam Other (See Comments)    unspecified  . Nsaids Other (See Comments)    Rectal bleeding  . Prednisone Other (See Comments)    unspecified  . Quetiapine Other (See Comments)    Psychosis with high dose (600mg )  . Sudafed [Pseudoephedrine Hcl] Other (See Comments)    Hallucinations  . Ziprasidone Other (See Comments)    unspecified     Home Medications  Prior to Admission medications   Medication Sig Start Date End Date Taking? Authorizing Provider  ALPRAZolam [name]) 1 MG tablet Take 1 mg by mouth 4 (four) times  daily. 07/30/18  Yes [provider]  doxepin (SINEQUAN) 50 MG capsule Take 50 mg by mouth at bedtime. 06/22/18  Yes [provider]  NORCO 10-325 MG tablet Take 0.5-1 tablets by mouth 2 (two) times daily as needed for pain. 05/20/18  Yes [provider]  OLANZapine (ZYPREXA) 5 MG tablet Take 5 mg by mouth at bedtime.   Yes [provider]     Critical care time: CRITICAL CARE Performed by: Jesus Genera   Total critical care time: 50 minutes  Critical care time was exclusive of separately billable procedures and treating other patients.  Critical care was necessary to treat or prevent imminent or life-threatening deterioration.  Critical care was time spent personally by me on the following activities: development of treatment plan with patient and/or surrogate as well as nursing, discussions with consultants, evaluation of patient's response to treatment, examination of patient, obtaining history from patient or surrogate, ordering and performing treatments and interventions, ordering and review of laboratory studies, ordering and review of radiographic studies, pulse oximetry and re-evaluation of patient's condition.      Salley Scarlet, MD [place]

## 2018-08-28 NOTE — ED Triage Notes (Signed)
BIB [facility] after being found outside of assumed to be home. Pt unresponsive initially but alert on EMS arrival. Pt responsive to voice but incomprehensible speech on arrival. Large laceration noted to R leg. Both legs cool to touch but pulses present.

## 2018-08-28 NOTE — Progress Notes (Signed)
Orthopedic Tech Progress Note Patient Details:  [name] 05/31/1971 [id number]  Ortho Devices Type of Ortho Device: Ace wrap, Short leg splint Ortho Device/Splint Interventions: Application   Post Interventions Patient Tolerated: Well Instructions Provided: Care of device   [place] Pink 08/28/2018, 8:45 AM

## 2018-08-29 DIAGNOSIS — S86021A Laceration of right Achilles tendon, initial encounter: Secondary | ICD-10-CM

## 2018-08-29 DIAGNOSIS — R579 Shock, unspecified: Secondary | ICD-10-CM

## 2018-08-29 LAB — BASIC METABOLIC PANEL
Anion gap: 7 (ref 5–15)
BUN: <5 mg/dL — ABNORMAL LOW (ref 6–20)
CO2: 28 mmol/L (ref 22–32)
Calcium: 8.7 mg/dL — ABNORMAL LOW (ref 8.9–10.3)
Chloride: 108 mmol/L (ref 98–111)
Creatinine, Ser: 0.91 mg/dL (ref 0.61–1.24)
GFR calc Af Amer: >60 mL/min (ref >60)
GFR calc non Af Amer: >60 mL/min (ref >60)
Glucose, Bld: 101 mg/dL — ABNORMAL HIGH (ref 70–99)
Potassium: 3.9 mmol/L (ref 3.5–5.1)
Sodium: 143 mmol/L (ref 135–145)

## 2018-08-29 LAB — URINE CULTURE: Culture: NO GROWTH

## 2018-08-29 LAB — CBC
HCT: 42.7 % (ref 39.0–52.0)
Hemoglobin: 14 g/dL (ref 13.0–17.0)
MCH: 29.6 pg (ref 26.0–34.0)
MCHC: 32.8 g/dL (ref 30.0–36.0)
MCV: 90.3 fL (ref 80.0–100.0)
Platelets: 200 10*3/uL (ref 150–400)
RBC: 4.73 MIL/uL (ref 4.22–5.81)
RDW: 12.1 % (ref 11.5–15.5)
WBC: 9.1 10*3/uL (ref 4.0–10.5)
nRBC: 0 % (ref 0.0–0.2)

## 2018-08-29 LAB — URINALYSIS, ROUTINE W REFLEX MICROSCOPIC
Bilirubin Urine: NEGATIVE
Bilirubin Urine: NEGATIVE
Glucose, UA: NEGATIVE mg/dL
Glucose, UA: NEGATIVE mg/dL
Hgb urine dipstick: NEGATIVE
Hgb urine dipstick: NEGATIVE
Ketones, ur: 40 mg/dL — AB
Ketones, ur: 40 mg/dL — AB
Leukocytes, UA: NEGATIVE
Leukocytes, UA: NEGATIVE
Nitrite: NEGATIVE
Nitrite: NEGATIVE
PH: 8 (ref 5.0–8.0)
Protein, ur: NEGATIVE mg/dL
Protein, ur: NEGATIVE mg/dL
SPECIFIC GRAVITY, URINE: 1.015 (ref 1.005–1.030)
Specific Gravity, Urine: 1.01 (ref 1.005–1.030)
pH: 6.5 (ref 5.0–8.0)

## 2018-08-29 LAB — BLOOD GAS, ARTERIAL
Acid-Base Excess: 3.6 mmol/L — ABNORMAL HIGH (ref 0.0–2.0)
BICARBONATE: 28.1 mmol/L — AB (ref 20.0–28.0)
Drawn by: 365271
FIO2: 21
O2 Saturation: 89.7 %
Patient temperature: 101.8
pCO2 arterial: 50.5 mmHg — ABNORMAL HIGH (ref 32.0–48.0)
pH, Arterial: 7.374 (ref 7.350–7.450)
pO2, Arterial: 59.5 mmHg — ABNORMAL LOW (ref 83.0–108.0)

## 2018-08-29 LAB — GLUCOSE, CAPILLARY
Glucose-Capillary: 97 mg/dL (ref 70–99)
Glucose-Capillary: 77 mg/dL (ref 70–99)
Glucose-Capillary: 71 mg/dL (ref 70–99)

## 2018-08-29 LAB — HIV ANTIBODY (ROUTINE TESTING W REFLEX): HIV Screen 4th Generation wRfx: NONREACTIVE

## 2018-08-29 LAB — PROCALCITONIN: Procalcitonin: <0.1 ng/mL

## 2018-08-29 LAB — TROPONIN I: Troponin I: <0.03 ng/mL (ref <0.03)

## 2018-08-29 MED ORDER — ORAL CARE MOUTH RINSE
15.0000 mL | Freq: Two times a day (BID) | OROMUCOSAL | Status: DC
  Administered 2018-08-29 (×2): 15 mL via OROMUCOSAL

## 2018-08-29 MED ORDER — ACETAMINOPHEN 650 MG RE SUPP
325.0000 mg | RECTAL | Status: DC | PRN
  Administered 2018-08-29: 650 mg via RECTAL
  Filled 2018-08-29: qty 1

## 2018-08-29 MED ORDER — CEFAZOLIN SODIUM-DEXTROSE 2-4 GM/100ML-% IV SOLN
2.0000 g | Freq: Three times a day (TID) | INTRAVENOUS | Status: AC
  Administered 2018-08-29 – 2018-09-01 (×9): 2 g via INTRAVENOUS
  Filled 2018-08-29 (×10): qty 100

## 2018-08-29 NOTE — Progress Notes (Signed)
[facility] Progress Note Patient Name: [name] DOB: 14-Feb-1971 MRN: [id number]   Date of Service  08/29/2018  HPI/Events of Note  Patient's encephalopathy has improved. Awake, alert, oriented X 3 and able to handle sips of water per bedside nurse. Request for diet.   eICU Interventions  Advance diet to Clear liquid diet.      Intervention Category Intermediate Interventions: Other:  Lysle Dingwall 08/29/2018, 9:10 PM

## 2018-08-29 NOTE — Progress Notes (Addendum)
   NAME:  [name], MRN:  [id number], DOB:  1970/09/19, LOS: 1 ADMISSION DATE:  08/28/2018, CONSULTATION DATE:  08/28/18 REFERRING MD:  ER, CHIEF COMPLAINT:  HYpotension   Brief History   47 y.o. male admitted today through the ER  for unresponsiveness, hypotension, hypothermia and bradycardia, right lower extremity laceration. Found down in yard and estimated to have been there for atleast 3 hrs in cold. Toxicology screen positive for benzodiazepine/narcotic.   Placed on dopamine due to suspected drug overdose.  Poison control consulted and PCCM called for admission.  Past Medical History  Alcohol, drug use, asthma, CHF, COPD, migraines, schizophrenia  Significant Hospital Events   12/22- Admit. Started on dopamine drip  Consults:  Orthopedics  Procedures:    Significant Diagnostic Tests:  Urine toxicology screen 12/21>  + for benzodiazepines, opioids  CT head 08/28/2018> no acute brain injury or spine injury.  Mild spondylosis of cervical spine.  ECG 12/21>NSR, possible lvh, possible incomplete RBBB, QTc 478 msec  Micro Data:  None  Antimicrobials:  None  Interim history/subjective:  Continues on dopamine drip Mental status is unchanged.  Objective   Blood pressure 108/62, pulse 72, temperature (!) 102.2 F (39 C), resp. rate (!) 24, height 6\' 1"  (1.854 m), weight 78.4 kg, SpO2 92 %.        Intake/Output Summary (Last 24 hours) at 08/29/2018 0812 Last data filed at 08/29/2018 0700 Gross per 24 hour  Intake 2270.66 ml  Output 1025 ml  Net 1245.66 ml   Filed Weights   08/28/18 0609 08/29/18 0406  Weight: 89 kg 78.4 kg   Examination: Gen:      No acute distress HEENT:  EOMI, sclera anicteric Neck:     No masses; no thyromegaly Lungs:    Clear to auscultation bilaterally; normal respiratory effort CV:         Regular rate and rhythm; no murmurs Abd:      + bowel sounds; soft, non-tender; no palpable masses, no distension Ext:    Right lower extremity  bandaged.  No edema. Skin:      Warm and dry; no rash Neuro: Minimally responsive to pain.  Focal deficits.  Resolved Hospital Problem list   Lactic acidosis  Assessment & Plan:  Suspected drug toxicity secondary to benzo/opiates Continue supportive care Keep in ICU as mental status still is poor.  Bradycardia Wean off dopamine Follow EKG, troponins  Fevers Observe off antibiotics Check cultures, procalcitonin.  Hypercarbic respiratory failure Follow ABGs. Protecting airway. No need for intubation at present  Schizophrenia Holding psychiatric medications.  Calf laceration Supportive care per orthopedics.  Best practice:  Diet: npo until encephalopathy resolves Pain/Anxiety/Delirium protocol (if indicated):  VAP protocol (if indicated):  DVT prophylaxis: lmwh GI prophylaxis:  Glucose control: q4h blood glucose Mobility: bedrest Code Status: full Family Communication: Mother at the bedside. Does not know much about his health as he lives independently. Disposition: ICU  The patient is critically ill with multiple organ system failure and requires high complexity decision making for assessment and support, frequent evaluation and titration of therapies, advanced monitoring, review of radiographic studies and interpretation of complex data.   Critical Care Time devoted to patient care services, exclusive of separately billable procedures, described in this note is 35 minutes.   Marshell Garfinkel MD [facility] Pager [phone] If no answer or after 3pm call: [phone] 08/29/2018, 8:12 AM

## 2018-08-29 NOTE — Consult Note (Signed)
ORTHOPAEDIC CONSULTATION  REQUESTING PHYSICIAN: Jesus Genera, MD  Chief Complaint: right calf and heel laceration  HPI: [name] is a 47 y.o. male who presents with laceration to posterior ankle overlying achilles Friday night.  He was found down in a yard and estimated to be there for at least 3 hrs.  tox screen positive for benzo and narcotics.  Laceration was irrigated and skin approximated by EDP upon admission.  He is minimally responsive to painful stimulation this morning.  He is still in ICU.  Past Medical History:  Diagnosis Date  . Asthma   . CHF (congestive heart failure) ([place])   . Chronic back pain   . COPD (chronic obstructive pulmonary disease) ([place])   . Knee pain, chronic   . Migraines   . Schizophrenia, schizo-affective ([place])    Past Surgical History:  Procedure Laterality Date  . extraction of wisdom teeth    . KNEE SURGERY     four times   Social History   Socioeconomic History  . Marital status: Single    Spouse name: Not on file  . Number of children: Not on file  . Years of education: Not on file  . Highest education level: Not on file  Occupational History  . Not on file  Social Needs  . Financial resource strain: Not on file  . Food insecurity:    Worry: Not on file    Inability: Not on file  . Transportation needs:    Medical: Not on file    Non-medical: Not on file  Tobacco Use  . Smoking status: Current Every Day Smoker    Packs/day: 2.00    Years: 10.00    Pack years: 20.00    Types: Cigarettes  . Smokeless tobacco: Never Used  Substance and Sexual Activity  . Alcohol use: No    Comment: occ  . Drug use: No  . Sexual activity: Never  Lifestyle  . Physical activity:    Days per week: Not on file    Minutes per session: Not on file  . Stress: Not on file  Relationships  . Social connections:    Talks on phone: Not on file    Gets together: Not on file    Attends religious service: Not on file    Active member of  club or organization: Not on file    Attends meetings of clubs or organizations: Not on file    Relationship status: Not on file  Other Topics Concern  . Not on file  Social History Narrative  . Not on file   Family History  Problem Relation Age of Onset  . Hypertension Father    - negative except otherwise stated in the family history section Allergies  Allergen Reactions  . Amoxicillin Diarrhea    Has patient had a PCN reaction causing immediate rash, facial/tongue/throat swelling, SOB or lightheadedness with hypotension: No Has patient had a PCN reaction causing severe rash involving mucus membranes or skin necrosis: No Has patient had a PCN reaction that required hospitalization: No Has patient had a PCN reaction occurring within the last 10 years: No If all of the above answers are "NO", then may proceed with Cephalosporin use.   Marland Kitchen Dextromethorphan-Guaifenesin Other (See Comments)    unknown  . Haloperidol Other (See Comments)    unspecified  . Meloxicam Other (See Comments)    unspecified  . Nsaids Other (See Comments)    Rectal bleeding  . Prednisone Other (See  Comments)    unspecified  . Quetiapine Other (See Comments)    Psychosis with high dose (600mg )  . Sudafed [Pseudoephedrine Hcl] Other (See Comments)    Hallucinations  . Ziprasidone Other (See Comments)    unspecified   Prior to Admission medications   Medication Sig Start Date End Date Taking? Authorizing Provider  ALPRAZolam [name]) 1 MG tablet Take 1 mg by mouth 4 (four) times daily. 07/30/18  Yes [provider]  doxepin (SINEQUAN) 50 MG capsule Take 50 mg by mouth at bedtime. 06/22/18  Yes [provider]  NORCO 10-325 MG tablet Take 0.5-1 tablets by mouth 2 (two) times daily as needed for pain. 05/20/18  Yes [provider]  OLANZapine (ZYPREXA) 5 MG tablet Take 5 mg by mouth at bedtime.   Yes [provider]   Dg Chest 2 View  Result Date: 08/28/2018 CLINICAL  DATA:  Found unresponsive. EXAM: CHEST - 2 VIEW COMPARISON:  06/14/2018 FINDINGS: Patient slightly rotated to the left. Lungs are somewhat hypoinflated with mild hazy patchy density over the left base without significant change and may be due to atelectasis versus chronic infection/inflammatory process. Cardiomediastinal silhouette and remainder of the exam is unchanged. IMPRESSION: Stable hazy patchy density over the left base which may be due to atelectasis versus chronic inflammatory/infectious process. Electronically Signed   By: Marin Olp M.D.   On: 08/28/2018 07:38   Ct Head Wo Contrast  Result Date: 08/28/2018 CLINICAL DATA:  Found unresponsive laying in yard outside of home. Abrasion over forehead. EXAM: CT HEAD WITHOUT CONTRAST CT CERVICAL SPINE WITHOUT CONTRAST TECHNIQUE: Multidetector CT imaging of the head and cervical spine was performed following the standard protocol without intravenous contrast. Multiplanar CT image reconstructions of the cervical spine were also generated. COMPARISON:  Head CT 01/15/2018 and head/cervical spine CT 01/22/2011 FINDINGS: CT HEAD FINDINGS Brain: Ventricles, cisterns and other CSF spaces are normal. There is no mass, mass effect, shift of midline structures or acute hemorrhage. No evidence of acute infarction. Vascular: No hyperdense vessel or unexpected calcification. Skull: Normal. Negative for fracture or focal lesion. Sinuses/Orbits: Orbits are normal. Paranasal sinuses are clear without air-fluid level or significant opacification. Mastoid air cells are clear. Other: None. CT CERVICAL SPINE FINDINGS Alignment: Normal. Skull base and vertebrae: Vertebral body heights are normal. There is mild spondylosis throughout the cervical spine. Atlantoaxial articulation is normal. Minimal uncovertebral joint spurring. No acute fracture or subluxation. No significant neural foraminal narrowing. Soft tissues and spinal canal: No prevertebral fluid or swelling. No  visible canal hematoma. Disc levels: Minimal disc space narrowing at the C5-6 level. Broad-based disc bulge at the C3-4, C4-5 and C5-6 levels. Upper chest: Negative. Other: None. IMPRESSION: No acute brain injury. No acute cervical spine injury. 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 Electronically Signed   By: Marin Olp M.D.   On: 08/28/2018 08:03   Ct Cervical Spine Wo Contrast  Result Date: 08/28/2018 CLINICAL DATA:  Found unresponsive laying in yard outside of home. Abrasion over forehead. EXAM: CT HEAD WITHOUT CONTRAST CT CERVICAL SPINE WITHOUT CONTRAST TECHNIQUE: Multidetector CT imaging of the head and cervical spine was performed following the standard protocol without intravenous contrast. Multiplanar CT image reconstructions of the cervical spine were also generated. COMPARISON:  Head CT 01/15/2018 and head/cervical spine CT 01/22/2011 FINDINGS: CT HEAD FINDINGS Brain: Ventricles, cisterns and other CSF spaces are normal. There is no mass, mass effect, shift of midline structures or acute hemorrhage. No  evidence of acute infarction. Vascular: No hyperdense vessel or unexpected calcification. Skull: Normal. Negative for fracture or focal lesion. Sinuses/Orbits: Orbits are normal. Paranasal sinuses are clear without air-fluid level or significant opacification. Mastoid air cells are clear. Other: None. CT CERVICAL SPINE FINDINGS Alignment: Normal. Skull base and vertebrae: Vertebral body heights are normal. There is mild spondylosis throughout the cervical spine. Atlantoaxial articulation is normal. Minimal uncovertebral joint spurring. No acute fracture or subluxation. No significant neural foraminal narrowing. Soft tissues and spinal canal: No prevertebral fluid or swelling. No visible canal hematoma. Disc levels: Minimal disc space narrowing at the C5-6 level. Broad-based disc bulge at the C3-4, C4-5 and C5-6 levels. Upper chest: Negative. Other: None.  IMPRESSION: No acute brain injury. No acute cervical spine injury. 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 Electronically Signed   By: Marin Olp M.D.   On: 08/28/2018 08:03   - pertinent xrays, CT, MRI studies were reviewed and independently interpreted  Positive ROS: All other systems have been reviewed and were otherwise negative with the exception of those mentioned in the HPI and as above.  Physical Exam: General: no acute distress Cardiovascular: No pedal edema Respiratory: No cyanosis, no use of accessory musculature GI: No organomegaly, abdomen is soft and non-tender Skin: No lesions in the area of chief complaint Neurologic: unable to assess Psychiatric: Patient is minimally responsive to painful stimulation Lymphatic: No axillary or cervical lymphadenopathy  MUSCULOSKELETAL:  - 7 cm transverse laceration overlying the achilles tendon - 2 small skin tears proximal to lac - unable to plantarflex on command - abnormal thompson test - he is able to dorsiflex - 2+ pulses  Assessment: Traumatic laceration of achilles tendon AMS  Plan: - reapplied soft dressing and will have ortho tech reapply splint - patient cannot tolerate prophylactic po keflex, therefore recommend IV ancef for at least 72 hrs - will reassess once patient is more alert  Thank you for the consult and the opportunity to see Mr. [name]. Eduard Roux, MD [place] 10:05 AM

## 2018-08-30 ENCOUNTER — Inpatient Hospital Stay (HOSPITAL_COMMUNITY): Payer: Medicare Other

## 2018-08-30 DIAGNOSIS — T50904D Poisoning by unspecified drugs, medicaments and biological substances, undetermined, subsequent encounter: Secondary | ICD-10-CM

## 2018-08-30 DIAGNOSIS — F250 Schizoaffective disorder, bipolar type: Secondary | ICD-10-CM

## 2018-08-30 DIAGNOSIS — T424X2A Poisoning by benzodiazepines, intentional self-harm, initial encounter: Principal | ICD-10-CM

## 2018-08-30 DIAGNOSIS — T1491XA Suicide attempt, initial encounter: Secondary | ICD-10-CM

## 2018-08-30 DIAGNOSIS — T391X2A Poisoning by 4-Aminophenol derivatives, intentional self-harm, initial encounter: Secondary | ICD-10-CM

## 2018-08-30 LAB — CBC
HCT: 40.1 % (ref 39.0–52.0)
Hemoglobin: 12.8 g/dL — ABNORMAL LOW (ref 13.0–17.0)
MCH: 28.6 pg (ref 26.0–34.0)
MCHC: 31.9 g/dL (ref 30.0–36.0)
MCV: 89.7 fL (ref 80.0–100.0)
Platelets: 142 10*3/uL — ABNORMAL LOW (ref 150–400)
RBC: 4.47 MIL/uL (ref 4.22–5.81)
RDW: 11.9 % (ref 11.5–15.5)
WBC: 6.2 10*3/uL (ref 4.0–10.5)
nRBC: 0 % (ref 0.0–0.2)

## 2018-08-30 LAB — BASIC METABOLIC PANEL
Anion gap: 6 (ref 5–15)
BUN: 5 mg/dL — ABNORMAL LOW (ref 6–20)
CO2: 30 mmol/L (ref 22–32)
Calcium: 8.8 mg/dL — ABNORMAL LOW (ref 8.9–10.3)
Chloride: 105 mmol/L (ref 98–111)
Creatinine, Ser: 0.89 mg/dL (ref 0.61–1.24)
GFR calc Af Amer: >60 mL/min (ref >60)
GFR calc non Af Amer: >60 mL/min (ref >60)
Glucose, Bld: 99 mg/dL (ref 70–99)
POTASSIUM: 4.1 mmol/L (ref 3.5–5.1)
Sodium: 141 mmol/L (ref 135–145)

## 2018-08-30 LAB — MAGNESIUM: MAGNESIUM: 2.1 mg/dL (ref 1.7–2.4)

## 2018-08-30 LAB — TROPONIN I: Troponin I: <0.03 ng/mL (ref <0.03)

## 2018-08-30 LAB — PROCALCITONIN: Procalcitonin: <0.1 ng/mL

## 2018-08-30 LAB — PHOSPHORUS: Phosphorus: 2.4 mg/dL — ABNORMAL LOW (ref 2.5–4.6)

## 2018-08-30 MED ORDER — OLANZAPINE 5 MG PO TABS
5.0000 mg | ORAL_TABLET | Freq: Every day | ORAL | Status: DC
  Administered 2018-08-31: 5 mg via ORAL
  Filled 2018-08-30 (×2): qty 1

## 2018-08-30 MED ORDER — DEXMEDETOMIDINE HCL IN NACL 400 MCG/100ML IV SOLN
0.4000 ug/kg/h | INTRAVENOUS | Status: DC
  Administered 2018-08-30: 0.4 ug/kg/h via INTRAVENOUS
  Filled 2018-08-30: qty 100

## 2018-08-30 MED ORDER — STERILE WATER FOR INJECTION IJ SOLN
INTRAMUSCULAR | Status: AC
  Administered 2018-08-30: 04:00:00
  Filled 2018-08-30: qty 10

## 2018-08-30 MED ORDER — HYDROCODONE-ACETAMINOPHEN 5-325 MG PO TABS
1.0000 | ORAL_TABLET | ORAL | Status: DC | PRN
  Administered 2018-08-30 – 2018-08-31 (×2): 1 via ORAL
  Filled 2018-08-30 (×2): qty 1

## 2018-08-30 MED ORDER — ALPRAZOLAM 0.5 MG PO TABS
0.5000 mg | ORAL_TABLET | Freq: Two times a day (BID) | ORAL | Status: DC
  Administered 2018-08-30 – 2018-09-02 (×5): 0.5 mg via ORAL
  Filled 2018-08-30 (×6): qty 1

## 2018-08-30 MED ORDER — DOXEPIN HCL 50 MG PO CAPS
50.0000 mg | ORAL_CAPSULE | Freq: Every day | ORAL | Status: DC
  Administered 2018-08-31 – 2018-09-05 (×7): 50 mg via ORAL
  Filled 2018-08-30 (×9): qty 1

## 2018-08-30 MED ORDER — OLANZAPINE 10 MG IM SOLR
5.0000 mg | Freq: Once | INTRAMUSCULAR | Status: AC
  Administered 2018-08-30: 5 mg via INTRAMUSCULAR
  Filled 2018-08-30: qty 10

## 2018-08-30 NOTE — Plan of Care (Addendum)
  Problem: Education: Goal: Knowledge of General Education information will improve Description Including pain rating scale, medication(s)/side effects and non-pharmacologic comfort measures Outcome: Progressing   Problem: Health Behavior/Discharge Planning: Goal: Ability to manage health-related needs will improve Outcome: Progressing   Problem: Clinical Measurements: Goal: Ability to maintain clinical measurements within normal limits will improve Outcome: Progressing Goal: Will remain free from infection Outcome: Progressing Goal: Diagnostic test results will improve Outcome: Progressing Goal: Respiratory complications will improve Outcome: Progressing Goal: Cardiovascular complication will be avoided Outcome: Progressing   Problem: Activity: Goal: Risk for activity intolerance will decrease Outcome: Progressing   Problem: Nutrition: Goal: Adequate nutrition will be maintained Outcome: Progressing   Problem: Coping: Goal: Level of anxiety will decrease Outcome: Not Progressing   Problem: Elimination: Goal: Will not experience complications related to bowel motility Outcome: Progressing Goal: Will not experience complications related to urinary retention Outcome: Progressing   Problem: Pain Managment: Goal: General experience of comfort will improve Outcome: Progressing   Problem: Safety: Goal: Ability to remain free from injury will improve Outcome: Progressing   Problem: Skin Integrity: Goal: Risk for impaired skin integrity will decrease Outcome: Progressing      Although pateint explicity denies SI, the patient does continue to intermittently scream "Kill me!" "Kill me!" and "Let me burn in hell!'. Continues to exhibit aggressive body language and incoherent thoughts. Refused scheduled Xanax. Psych consult in place. No longer requiring supplemental O2 or dopamine infusion.

## 2018-08-30 NOTE — Progress Notes (Signed)
Pt became increasingly agitated about not having a "hot meal". While this RN was talking to the MD the pt got out of bed cursing and saying he would fight the sitter. Pt pulled out his IV's and removed all cords attached to him. MD Oletta Darter ordered Olanzapine 5mg  and precedex once on IV was inserted. Pt resting now. Will continue to assess.

## 2018-08-30 NOTE — Progress Notes (Signed)
[facility] Progress Note Patient Name: [name] DOB: [birth year] MRN: [id number]   Date of Service  08/30/2018  HPI/Events of Note  Did not tolerate Precedex d/t bradycardia down to 50 and hypotension with BP = 84/84.  eICU Interventions  Will order: 1. Zyprexa 5 mg IM now.  2. D/C Precedex.     Intervention Category Major Interventions: Delirium, psychosis, severe agitation - evaluation and management  Sommer,Steven Eugene 08/30/2018, 3:58 AM

## 2018-08-30 NOTE — Progress Notes (Signed)
[facility] Progress Note Patient Name: [name] DOB: 01-May-1971 MRN: [id number]   Date of Service  08/30/2018  HPI/Events of Note  Agitation - Allergic to Haldol. Reaction not specified. Tolerated clear liquids.   eICU Interventions  Will order: 1. Advance diet to Heart Healthy. 2. Precedex IV infusion. Titrate to RASS = 0.      Intervention Category Major Interventions: Delirium, psychosis, severe agitation - evaluation and management  [name] 08/30/2018, 1:36 AM

## 2018-08-30 NOTE — Progress Notes (Addendum)
   NAME:  [name], MRN:  [id number], DOB:  Dec 23, 1970, LOS: 2 ADMISSION DATE:  08/28/2018, CONSULTATION DATE:  08/28/18 REFERRING MD:  ER, CHIEF COMPLAINT:  HYpotension   Brief History   47 y.o. male admitted today through the ER  for unresponsiveness, hypotension, hypothermia and bradycardia, right lower extremity laceration. Found down in yard and estimated to have been there for atleast 3 hrs in cold. Toxicology screen positive for benzodiazepine/narcotic.   Placed on dopamine due to suspected drug overdose.  Poison control consulted and PCCM called for admission.  Past Medical History  Alcohol, drug use, asthma, CHF, COPD, migraines, schizophrenia  Significant Hospital Events   12/22- Admit. Started on dopamine drip 12/23- Off dopamine, agitated. Precedex started but stopped due to bradycardia, hypotension  Consults:  Orthopedics  Procedures:    Significant Diagnostic Tests:  Urine toxicology screen 12/21>  + for benzodiazepines, opioids  CT head 08/28/2018> no acute brain injury or spine injury.  Mild spondylosis of cervical spine. Chest x-ray 12/23-basilar atelectasis, interval clearing of left base infiltrate.  I have reviewed the images personally.  ECG 12/21>NSR, possible lvh, possible incomplete RBBB, QTc 478 msec  Micro Data:  Bcx 12/22 > negative  Antimicrobials:  Ancef 12/22 >   Interim history/subjective:  Off dopamine drip Had episodes of agitation last night.  Precedex initiated but stopped due to hypotension, bradycardia Given Zyprexa  Objective   Blood pressure 109/81, pulse 61, temperature 98.1 F (36.7 C), temperature source Oral, resp. rate 17, height 6\' 1"  (1.854 m), weight 78.6 kg, SpO2 100 %.        Intake/Output Summary (Last 24 hours) at 08/30/2018 0918 Last data filed at 08/30/2018 0852 Gross per 24 hour  Intake 2999.92 ml  Output 2465 ml  Net 534.92 ml   Filed Weights   08/28/18 0609 08/29/18 0406 08/30/18 0250  Weight: 89 kg 78.4  kg 78.6 kg   Examination: Gen:      No acute distress HEENT:  EOMI, sclera anicteric Neck:     No masses; no thyromegaly Lungs:    Clear to auscultation bilaterally; normal respiratory effort CV:         Regular rate and rhythm; no murmurs Abd:      + bowel sounds; soft, non-tender; no palpable masses, no distension Ext:    Right lower extremity dressing. Skin:      Warm and dry; no rash Neuro: Somnolent, arousable.  Resolved Hospital Problem list   Lactic acidosis Hypercarbic respiratory failure  Assessment & Plan:  Suspected drug toxicity secondary to benzo/opiates Past history of suicide attempt Ok to transfer out of ICU Supportive care Continue sitter Psychiatry consulted  Bradycardia Off dopamine Tele montioring  Schizophrenia On zyprexa now Resume home psych meds Low dose xanax and PRN norco  Calf, achilles tendon laceration Supportive care per orthopedics. IV ancef  Best practice:  Diet: npo until encephalopathy resolves Pain/Anxiety/Delirium protocol (if indicated): NA VAP protocol (if indicated): NA DVT prophylaxis: lmwh GI prophylaxis: NA Glucose control: q4h blood glucose Mobility: bedrest Code Status: full Family Communication: No family at bedside 12/23 Disposition: PCU  Marshell Garfinkel MD [facility] 08/30/2018, 9:34 AM
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
## 2018-08-30 NOTE — Progress Notes (Addendum)
[facility] Progress Note Patient Name: [name] DOB: 1971/02/25 MRN: [id number]   Date of Service  08/30/2018  HPI/Events of Note  Agitation - Patient pulled out IV's. Allergies documented to Haldol and Geodon. QTc interval = 0.4 seconds.   eICU Interventions  Will order: 1. Zyprexa 5 mg IV X 1 now.      Intervention Category Major Interventions: Delirium, psychosis, severe agitation - evaluation and management  [name] 08/30/2018, 1:51 AM

## 2018-08-31 ENCOUNTER — Encounter (HOSPITAL_COMMUNITY): Payer: Self-pay | Admitting: General Practice

## 2018-08-31 ENCOUNTER — Inpatient Hospital Stay (HOSPITAL_COMMUNITY): Payer: Medicare Other

## 2018-08-31 ENCOUNTER — Other Ambulatory Visit: Payer: Self-pay

## 2018-08-31 DIAGNOSIS — T40604A Poisoning by unspecified narcotics, undetermined, initial encounter: Secondary | ICD-10-CM

## 2018-08-31 LAB — CBC
HCT: 39.2 % (ref 39.0–52.0)
Hemoglobin: 12.8 g/dL — ABNORMAL LOW (ref 13.0–17.0)
MCH: 29.1 pg (ref 26.0–34.0)
MCHC: 32.7 g/dL (ref 30.0–36.0)
MCV: 89.1 fL (ref 80.0–100.0)
PLATELETS: 163 10*3/uL (ref 150–400)
RBC: 4.4 MIL/uL (ref 4.22–5.81)
RDW: 11.9 % (ref 11.5–15.5)
WBC: 5.1 10*3/uL (ref 4.0–10.5)
nRBC: 0 % (ref 0.0–0.2)

## 2018-08-31 LAB — BASIC METABOLIC PANEL
Anion gap: 9 (ref 5–15)
BUN: <5 mg/dL — ABNORMAL LOW (ref 6–20)
CO2: 27 mmol/L (ref 22–32)
Calcium: 8.9 mg/dL (ref 8.9–10.3)
Chloride: 109 mmol/L (ref 98–111)
Creatinine, Ser: 0.74 mg/dL (ref 0.61–1.24)
GFR calc Af Amer: >60 mL/min (ref >60)
GLUCOSE: 100 mg/dL — AB (ref 70–99)
Potassium: 3.6 mmol/L (ref 3.5–5.1)
Sodium: 145 mmol/L (ref 135–145)

## 2018-08-31 LAB — PHOSPHORUS: Phosphorus: 2.8 mg/dL (ref 2.5–4.6)

## 2018-08-31 LAB — URINE CULTURE: Culture: 20000 — AB

## 2018-08-31 LAB — MAGNESIUM: Magnesium: 2 mg/dL (ref 1.7–2.4)

## 2018-08-31 LAB — PROCALCITONIN: Procalcitonin: <0.1 ng/mL

## 2018-08-31 MED ORDER — PERPHENAZINE 4 MG PO TABS
4.0000 mg | ORAL_TABLET | Freq: Two times a day (BID) | ORAL | Status: DC
  Administered 2018-08-31 – 2018-09-06 (×12): 4 mg via ORAL
  Filled 2018-08-31 (×13): qty 1

## 2018-08-31 MED ORDER — HYDROCODONE-ACETAMINOPHEN 5-325 MG PO TABS: 1.0000 | ORAL_TABLET | Freq: Two times a day (BID) | ORAL | Status: DC | PRN

## 2018-08-31 NOTE — Care Management Important Message (Signed)
Important Message  Patient Details  Name: [name] MRN: [id number] Date of Birth: 1970-09-20   Medicare Important Message Given:  Yes  Due to illness patient did not sign.  Unsigned copy left  Shirly Bartosiewicz 08/31/2018, 3:25 PM

## 2018-08-31 NOTE — Plan of Care (Signed)

## 2018-08-31 NOTE — Progress Notes (Signed)
   08/31/18 1100  Clinical Encounter Type  Visited With Patient  Visit Type Initial  Referral From Nurse  Consult/Referral To Chaplain  Spiritual Encounters  Spiritual Needs Emotional  Stress Factors  Patient Stress Factors Exhausted;Other (Comment)   Responded to spiritual care consult. PT was alert and very slow talking. Also, PT asked me how do I make sense of the voices he hears in his head all time. I was a little concerned about his question and offered spiritual care with words of encouragement, ministry of presence and silent prayer. Chaplain available upon request.  Chaplain Fidel Levy [phone]
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
## 2018-08-31 NOTE — Progress Notes (Signed)
Orthopedic Tech Progress Note Patient Details:  [name] May 25, 1971 [id number] Applied with Sanda Linger Devices Type of Ortho Device: Short leg splint Ortho Device/Splint Location: LRE Ortho Device/Splint Interventions: Application, Ordered   Post Interventions Patient Tolerated: Well Instructions Provided: Care of device   [name] 08/31/2018, 11:41 AM
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
## 2018-08-31 NOTE — Progress Notes (Signed)
  Pt orientation to unit, room and routine. Information packet given to patient/family and safety video watched.  Admission INP armband ID verified with patient/family, and in place. SR up x 2, fall risk assessment complete with Patient and family verbalizing understanding of risks associated with falls. Pt verbalizes an understanding of how to use the call bell and to call for help before getting out of bed.  Skin assessment completed.  Will cont to monitor and assist as needed.  Hosie Spangle, [place] 08/31/2018

## 2018-09-01 DIAGNOSIS — T50904A Poisoning by unspecified drugs, medicaments and biological substances, undetermined, initial encounter: Secondary | ICD-10-CM
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
## 2018-09-02 MED ORDER — ALPRAZOLAM 0.25 MG PO TABS
0.2500 mg | ORAL_TABLET | Freq: Two times a day (BID) | ORAL | Status: DC
  Administered 2018-09-02 – 2018-09-06 (×8): 0.25 mg via ORAL
  Filled 2018-09-02 (×8): qty 1
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
## 2018-09-02 NOTE — Progress Notes (Addendum)
5pm-IVC paperwork placed on chart to be served.   2pm-CSW will initiate IVC paperwork to prepare for transfer to inpatient psych.   8:30am-CSW received referral regarding inpatient psych placement. CSW will send out referrals.   [name] Lyrica Mcclarty LCSW [phone]

## 2018-09-02 NOTE — Progress Notes (Signed)
Patient mother [name] will like to be updated when a facility is choose and wants to speak with psychiatrist.

## 2018-09-03 LAB — CULTURE, BLOOD (ROUTINE X 2)
Culture: NO GROWTH
Culture: NO GROWTH
Special Requests: ADEQUATE
Special Requests: ADEQUATE

## 2018-09-03 NOTE — Progress Notes (Addendum)
[facility] is reviewing referral. If [facility] contacts patient's RN and is able to accept patient today after daytime CSW is gone, due to patient being IVC'd, RN please Building surveyor for transport: [phone].Marland Kitchen   [facility] does not have beds available today.   Percell Locus Rahmel Nedved LCSW [phone]

## 2018-09-03 NOTE — Progress Notes (Signed)
Pt Refused his lovenox stated he doesn't need it.
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
## 2018-09-03 NOTE — Care Management Important Message (Signed)
Important Message  Patient Details  Name: [name] MRN: [id number] Date of Birth: 01/03/1971   Medicare Important Message Given:  Yes    Elora Wolter P Jerrianne Hartin 09/03/2018, 4:50 PM

## 2018-09-03 NOTE — Progress Notes (Signed)
Pt anxious and irritated most of the night. Pt did not sleep or rest. Pt pace and talk to himself. When asked if he was hearing voice, pt denied. Pt mother call and stated that pt need to be watched closely because he heard IVC and was reading to leave. Educated pt mother that pt have a Actuary.

## 2018-09-04 LAB — CREATININE, SERUM
Creatinine, Ser: 1 mg/dL (ref 0.61–1.24)
GFR calc Af Amer: >60 mL/min (ref >60)
GFR calc non Af Amer: >60 mL/min (ref >60)
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
## 2018-09-05 MED ORDER — ACETAMINOPHEN 325 MG PO TABS
650.0000 mg | ORAL_TABLET | Freq: Four times a day (QID) | ORAL | Status: DC | PRN
  Administered 2018-09-05: 650 mg via ORAL
  Filled 2018-09-05: qty 2
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
## 2018-09-06 DIAGNOSIS — J9601 Acute respiratory failure with hypoxia: Secondary | ICD-10-CM

## 2018-09-06 DIAGNOSIS — F200 Paranoid schizophrenia: Secondary | ICD-10-CM

## 2018-09-06 MED ORDER — PERPHENAZINE 4 MG PO TABS: 4.0000 mg | ORAL_TABLET | Freq: Two times a day (BID) | ORAL | Status: DC

## 2018-09-06 MED ORDER — ACETAMINOPHEN 325 MG PO TABS: 650.0000 mg | ORAL_TABLET | Freq: Four times a day (QID) | ORAL | Status: DC | PRN
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
## 2018-09-06 NOTE — Progress Notes (Signed)
Patient removed his dressing to the left leg.  Redressed the wound.  Educated the patient on the importance of keeping the dressing intact.  Will continue to monitor the patient and notify MD as needed
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
## 2018-09-06 NOTE — Progress Notes (Signed)
Patient will DC to: [place] Anticipated DC date: 09/06/18 Family notified: Mother by phone Transport by: [place] [facility]nick due to [facility]   Per MD patient ready for DC to [facility]. RN, patient, patient's family, and facility notified of DC. Discharge Summary sent to facility. RN to call report prior to discharge [phone]). DC packet on chart, please send IVC paperwork with patient. Sheriff transport arranged for next available.   CSW will sign off for now as social work intervention is no longer needed. Please consult [place] again if new needs arise.  Cedric Fishman, LCSW Clinical Social Worker [phone]

## 2018-09-06 NOTE — Progress Notes (Signed)
Splint is to remain on until follow up in office in 1 week. NWB.

## 2018-09-06 NOTE — Progress Notes (Signed)
Patient has been accepted at [facility]. Will alert MD. Patient's mother updated.   [name] Locus [name] LCSW [phone]

## 2018-09-06 NOTE — Progress Notes (Addendum)
CSW followed up with [facility]. They will give me a call back to see if they have beds available.   No beds available at [facility]. CSW faxed referral to [facility] for review.   Percell Locus Milan Perkins LCSW [phone]

## 2018-10-05 IMAGING — MR MR HEAD W/O CM
8 of 10 series · 38 of 48 positions shown · non-contrast
Comparison: Head CT 04/28/2014

CLINICAL DATA: Altered level of consciousness.  Overdose.

EXAM:
MRI HEAD WITHOUT CONTRAST
TECHNIQUE: Multiplanar, multiecho pulse sequences of the brain and surrounding
structures were obtained without intravenous contrast.

[Series 3: DWI · axial · 3.0mm · 1.09mm/px · z∈[-67,+90]mm · 11 of 108 slices shown (1 of 4)]
[im 1/108]
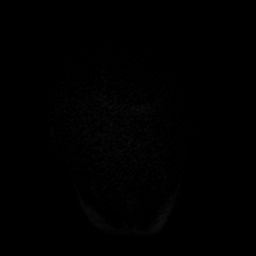
[im 11/108]
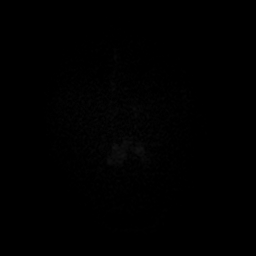
[im 22/108]
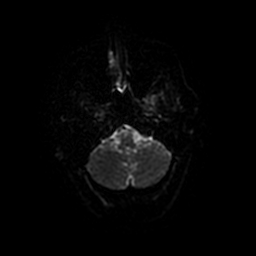
[im 33/108]
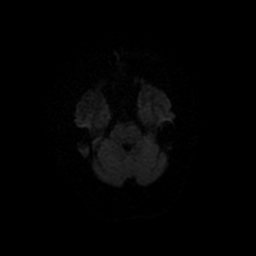
[im 43/108]
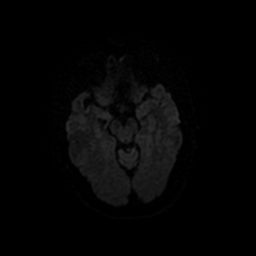
[im 54/108]
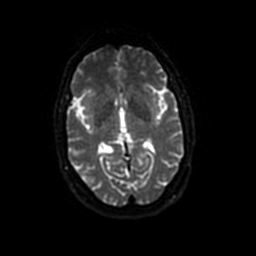
[im 65/108]
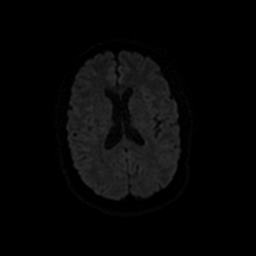
[im 75/108]
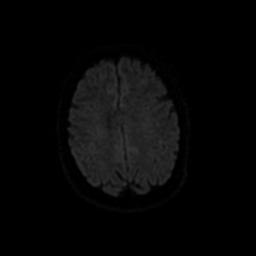
[im 86/108]
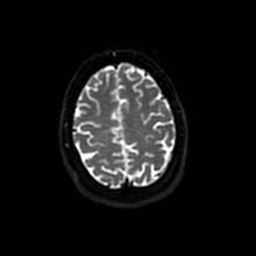
[im 97/108]
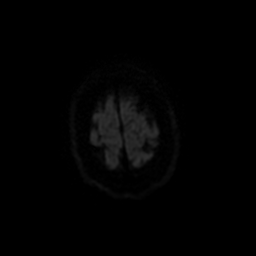
[im 108/108]
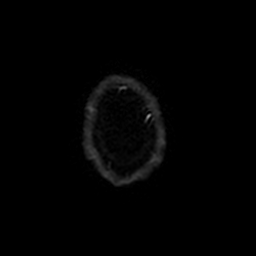

[Series 4: T1 · sagittal · 5.0mm · 0.47mm/px · 2 of 24 slices shown]
[im 1/24]
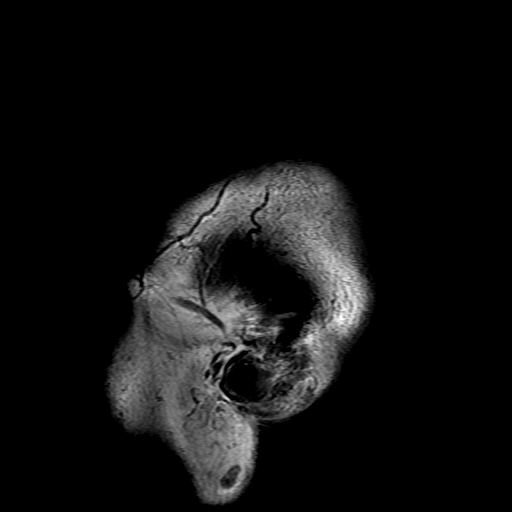
[im 24/24]
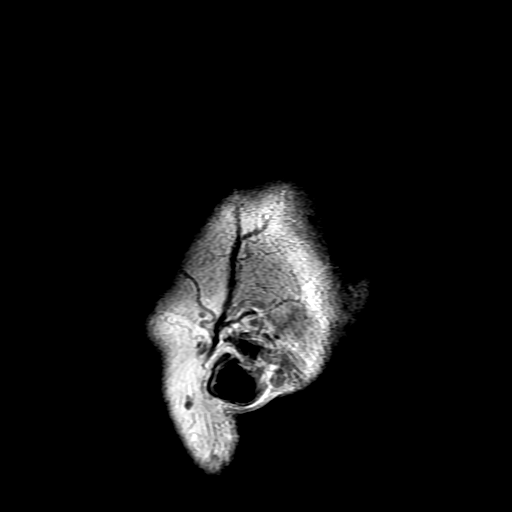

[Series 5: DWI · coronal · 5.0mm · 1.09mm/px · 7 of 62 slices shown (2 of 4)]
[im 1/62]
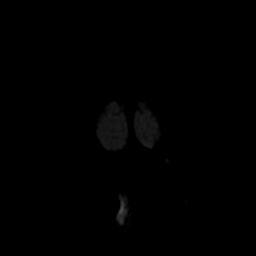
[im 11/62]
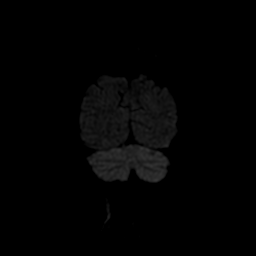
[im 21/62]
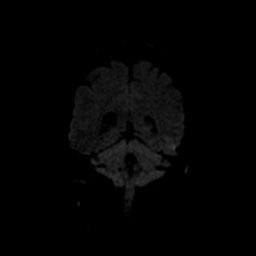
[im 31/62]
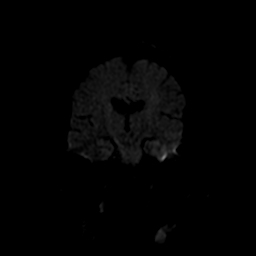
[im 41/62]
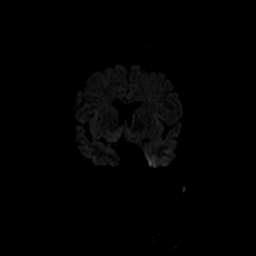
[im 51/62]
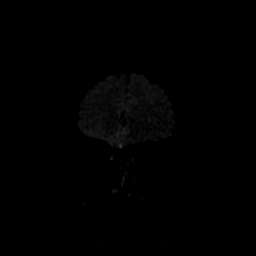
[im 62/62]
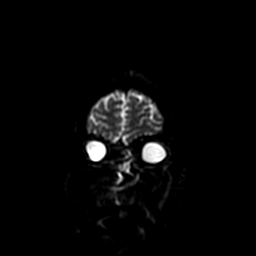

[Series 7: T2 · axial · 5.0mm · 0.43mm/px · z∈[-82,+78]mm · 3 of 28 slices shown (1 of 2)]
[im 1/28]
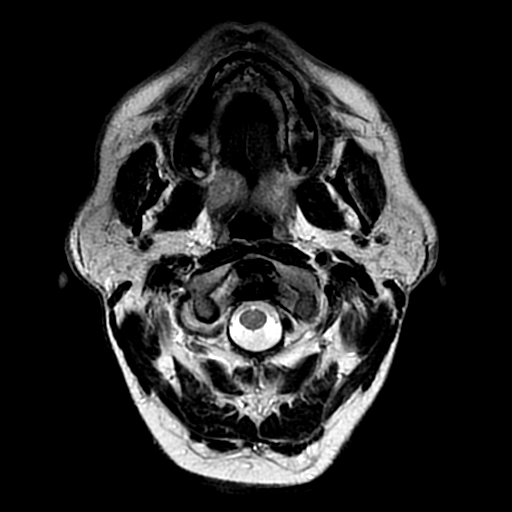
[im 14/28]
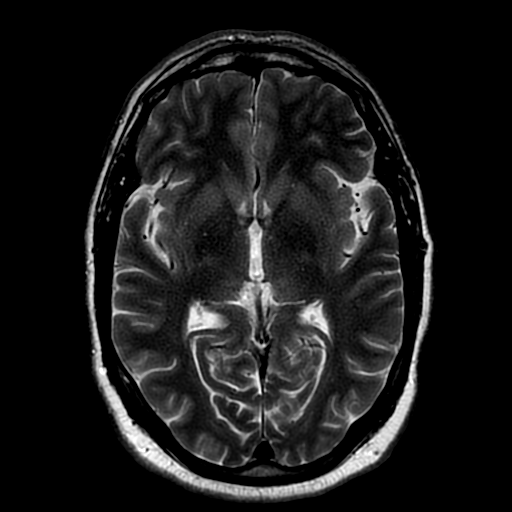
[im 28/28]
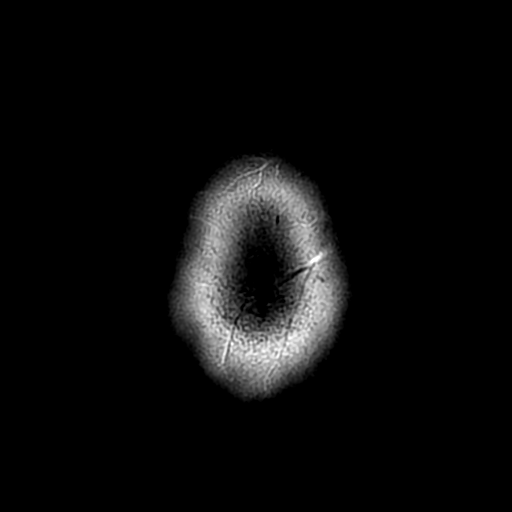

[Series 8: FLAIR · axial · 5.0mm · 0.43mm/px · z∈[-82,+78]mm · 3 of 28 slices shown]
[im 1/28]
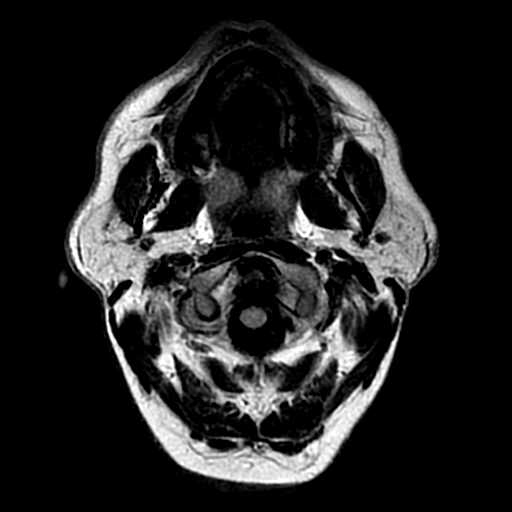
[im 14/28]
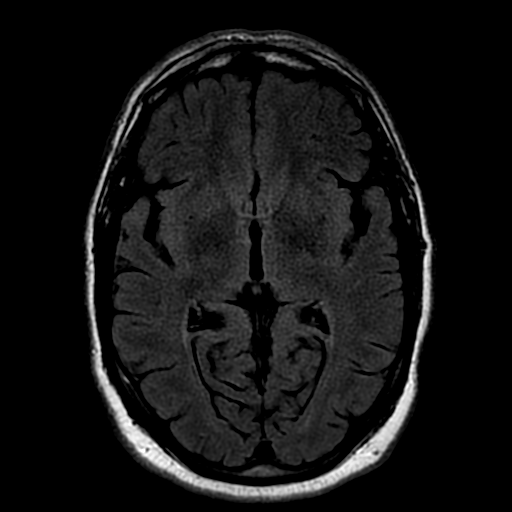
[im 28/28]
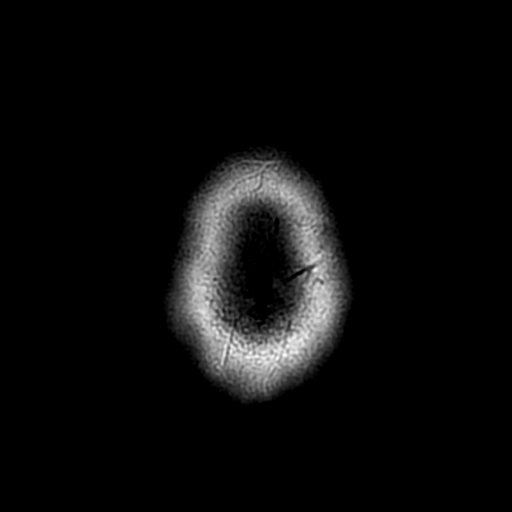

[Series 10: T2 · coronal · 5.0mm · 0.47mm/px · 3 of 24 slices shown (2 of 2)]
[im 1/24]
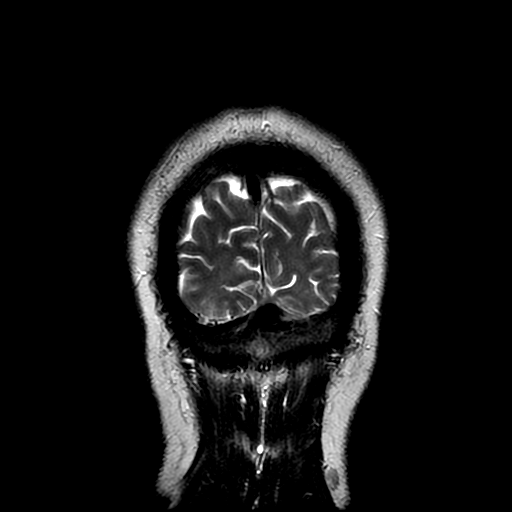
[im 12/24]
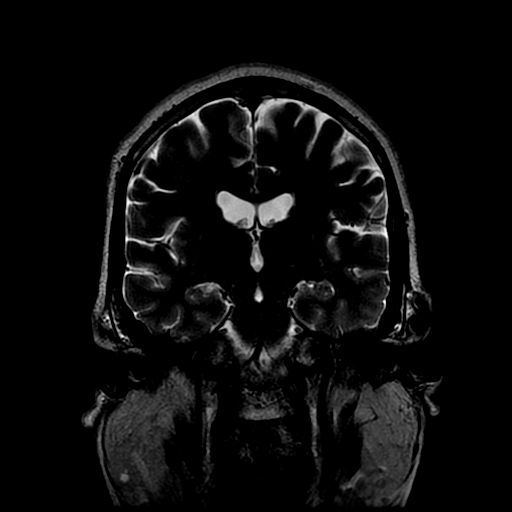
[im 24/24]
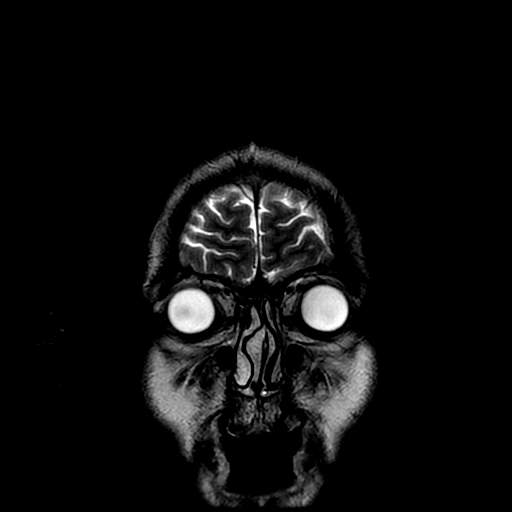

[Series 300: DWI · axial · 3.0mm · 1.09mm/px · z∈[-67,+90]mm · 6 of 54 slices shown (3 of 4)]
[im 1/54]
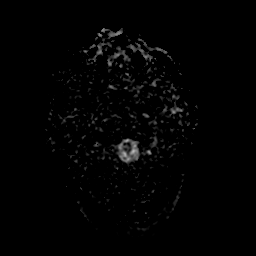
[im 11/54]
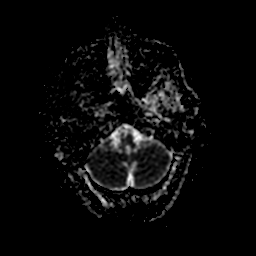
[im 22/54]
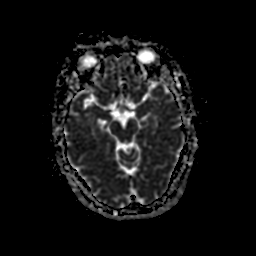
[im 32/54]
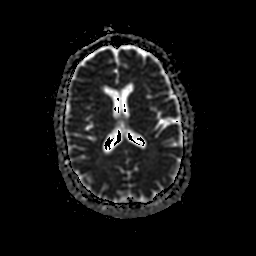
[im 43/54]
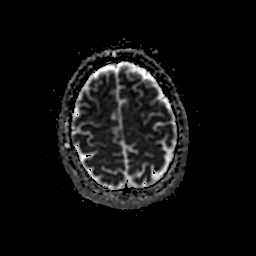
[im 54/54]
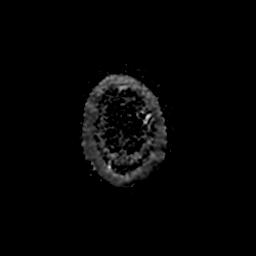

[Series 500: DWI · coronal · 5.0mm · 1.09mm/px · 3 of 31 slices shown (4 of 4)]
[im 1/31]
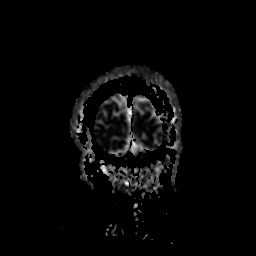
[im 16/31]
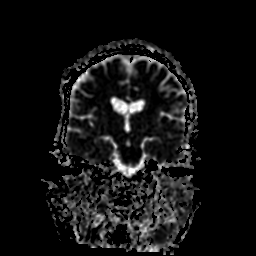
[im 31/31]
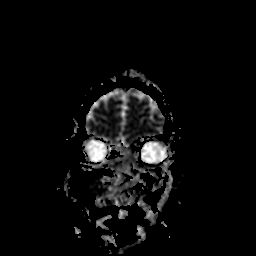

[38 of 48 positions shown; findings below may reference images not displayed]

FINDINGS: Brain: There is no evidence of acute infarct, intracranial
hemorrhage, mass, midline shift, or extra-axial fluid collection.
The ventricles and sulci are normal. There is a chronic lacunar
infarct in the right lentiform nucleus, either new or poorly
demonstrated on the prior CT. The brain is otherwise normal in
signal.

Vascular: Major intracranial vascular flow voids are preserved.

Skull and upper cervical spine: Unremarkable bone marrow signal.

Sinuses/Orbits:  No significant sinus disease.  Unremarkable orbits.

Other: None.
IMPRESSION: 1. No acute intracranial abnormality.
2. Chronic right basal ganglia lacunar infarct.

## 2018-10-06 ENCOUNTER — Ambulatory Visit (INDEPENDENT_AMBULATORY_CARE_PROVIDER_SITE_OTHER): Payer: Medicare Other | Admitting: Orthopaedic Surgery

## 2018-10-22 ENCOUNTER — Ambulatory Visit (INDEPENDENT_AMBULATORY_CARE_PROVIDER_SITE_OTHER): Payer: Medicare Other | Admitting: Family Medicine

## 2018-10-22 ENCOUNTER — Encounter (INDEPENDENT_AMBULATORY_CARE_PROVIDER_SITE_OTHER): Payer: Self-pay | Admitting: Family Medicine

## 2018-10-22 ENCOUNTER — Ambulatory Visit (INDEPENDENT_AMBULATORY_CARE_PROVIDER_SITE_OTHER): Payer: Medicare Other

## 2018-10-22 DIAGNOSIS — M25572 Pain in left ankle and joints of left foot: Secondary | ICD-10-CM

## 2018-10-22 MED ORDER — MUPIROCIN 2 % EX OINT: 1.0000 "application " | TOPICAL_OINTMENT | Freq: Two times a day (BID) | CUTANEOUS | 3 refills | Status: DC

## 2018-10-22 MED ORDER — DICLOFENAC SODIUM 1 % TD GEL: 4.0000 g | Freq: Four times a day (QID) | TRANSDERMAL | 6 refills | Status: DC | PRN

## 2018-10-22 NOTE — Progress Notes (Signed)
Office Visit Note   Patient: [name]           Date of Birth: 1971/01/08           MRN: [id number] Visit Date: 10/22/2018 Requested by: Christain Sacramento, MD [address] [place],  [address] PCP: Christain Sacramento, MD  Subjective: Chief Complaint  Patient presents with  . Right Ankle - Edema  . Ankle Pain    started notice swelling in 09/2018, no using any thing on , sore to touch, numbness, tingling in pinky toe     HPI: He is here with right ankle pain.  Apparently in December he was intoxicated and kicked something, sustained a laceration to the posterior aspect of his right lower leg.  He went to the ER and was treated with laceration repair and antibiotics.  His wound is slowly healing but he has had some pain on the posterior medial aspect of his ankle in the last week or so.  He is asking me to call his pain medicine physician to authorize extra medication.  He also states that he has chronic low back pain whenever he walks more than about 30 feet.  He requests more pain medicine for this.               ROS: Noncontributory  Objective: Vital Signs: There were no vitals taken for this visit.  Physical Exam:  Right ankle: His wound is healing with good scab formation and no drainage, no surrounding erythema.  There is a little bit of tenderness posterior to the medial malleolus.  Good range of motion of his ankle.  Imaging: X-rays right ankle: Moderate arthritis with old osteochondral lesion medial talus and posterior loose bodies.   Assessment & Plan: 1.  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  Clinically healing posterior lower leg wound. -Trial of Voltaren gel topically.  Could inject the ankle with cortisone if symptoms persist.  I do not think he needs more pain medications.   Follow-Up Instructions: No follow-ups on file.      Procedures: No procedures performed  No notes on file    PMFS History: Patient Active Problem List   Diagnosis Date Noted  . Laceration of Achilles tendon, right, initial encounter   . Shock ([place]) 08/28/2018  . Suicide attempt ([place])   . Benzodiazepine (tranquilizer) overdose, intentional self-harm, initial encounter ([place]) 06/13/2018  . Overdose of benzodiazepine, intentional self-harm, initial encounter ([place])   . Somnolence   . Schizophrenia, paranoid type ([place]) 08/19/2017  . Hypokalemia 04/28/2014  . Drug overdose 04/27/2014  . Schizoaffective disorder, bipolar type ([place]) 03/02/2014  . Anxiety state 03/02/2014  . Benzodiazepine dependence ([place]) 03/02/2014  . Chest pain 10/11/2013  . EKG abnormalities 10/11/2013  . Chronic back pain    Past Medical History:  Diagnosis Date  . Asthma   . CHF (congestive heart failure) ([place])   . Chronic back pain   . COPD (chronic obstructive pulmonary disease) ([place])   . Knee pain, chronic   . Migraines   . Schizophrenia, schizo-affective ([place])     Family History  Problem Relation Age of Onset  . Hypertension Father     Past Surgical History:  Procedure Laterality Date  . KNEE SURGERY     four times  . WISDOM TOOTH EXTRACTION     Social History   Occupational History  . Not on file  Tobacco Use  . Smoking status: Current Every Day  Smoker    Packs/day: 2.00    Years: 33.00    Pack years: 66.00    Types: Cigarettes  . Smokeless tobacco: Never Used  Substance and Sexual Activity  . Alcohol use: No    Comment: occ  . Drug use: No  . Sexual activity: Never

## 2018-11-27 ENCOUNTER — Emergency Department (HOSPITAL_COMMUNITY)
Admission: EM | Admit: 2018-11-27 | Discharge: 2018-11-29 | Disposition: A | Discharge status: Other Acute Inpatient Hospital | Payer: Medicare Other | Attending: Emergency Medicine | Admitting: Emergency Medicine

## 2018-11-27 DIAGNOSIS — X838XXA Intentional self-harm by other specified means, initial encounter: Secondary | ICD-10-CM | POA: Diagnosis not present

## 2018-11-27 DIAGNOSIS — I509 Heart failure, unspecified: Secondary | ICD-10-CM | POA: Diagnosis not present

## 2018-11-27 DIAGNOSIS — Y92039 Unspecified place in apartment as the place of occurrence of the external cause: Secondary | ICD-10-CM | POA: Insufficient documentation

## 2018-11-27 DIAGNOSIS — T1491XA Suicide attempt, initial encounter: Secondary | ICD-10-CM | POA: Diagnosis not present

## 2018-11-27 DIAGNOSIS — J45909 Unspecified asthma, uncomplicated: Secondary | ICD-10-CM | POA: Insufficient documentation

## 2018-11-27 DIAGNOSIS — T50902A Poisoning by unspecified drugs, medicaments and biological substances, intentional self-harm, initial encounter: Secondary | ICD-10-CM | POA: Diagnosis not present

## 2018-11-27 DIAGNOSIS — Z046 Encounter for general psychiatric examination, requested by authority: Secondary | ICD-10-CM | POA: Diagnosis not present

## 2018-11-27 DIAGNOSIS — Y9389 Activity, other specified: Secondary | ICD-10-CM | POA: Insufficient documentation

## 2018-11-27 DIAGNOSIS — R400 Somnolence: Secondary | ICD-10-CM | POA: Diagnosis present

## 2018-11-27 DIAGNOSIS — F17210 Nicotine dependence, cigarettes, uncomplicated: Secondary | ICD-10-CM | POA: Insufficient documentation

## 2018-11-27 DIAGNOSIS — R45851 Suicidal ideations: Secondary | ICD-10-CM | POA: Diagnosis not present

## 2018-11-27 DIAGNOSIS — F329 Major depressive disorder, single episode, unspecified: Secondary | ICD-10-CM | POA: Diagnosis not present

## 2018-11-27 DIAGNOSIS — Y999 Unspecified external cause status: Secondary | ICD-10-CM | POA: Diagnosis not present

## 2018-11-27 LAB — COMPREHENSIVE METABOLIC PANEL
ALBUMIN: 3.9 g/dL (ref 3.5–5.0)
ALT: 33 U/L (ref 0–44)
AST: 54 U/L — ABNORMAL HIGH (ref 15–41)
Alkaline Phosphatase: 60 U/L (ref 38–126)
Anion gap: 9 (ref 5–15)
CO2: 25 mmol/L (ref 22–32)
Calcium: 9.9 mg/dL (ref 8.9–10.3)
Chloride: 106 mmol/L (ref 98–111)
Creatinine, Ser: 0.97 mg/dL (ref 0.61–1.24)
GFR calc Af Amer: >60 mL/min (ref >60)
GFR calc non Af Amer: >60 mL/min (ref >60)
GLUCOSE: 136 mg/dL — AB (ref 70–99)
Potassium: 3.6 mmol/L (ref 3.5–5.1)
Sodium: 140 mmol/L (ref 135–145)
Total Bilirubin: 0.9 mg/dL (ref 0.3–1.2)
Total Protein: 7 g/dL (ref 6.5–8.1)

## 2018-11-27 LAB — LIPASE, BLOOD: Lipase: 29 U/L (ref 11–51)

## 2018-11-27 LAB — CBC WITH DIFFERENTIAL/PLATELET
ABS IMMATURE GRANULOCYTES: 0.05 10*3/uL (ref 0.00–0.07)
Basophils Absolute: 0.1 10*3/uL (ref 0.0–0.1)
Basophils Relative: 1 %
Eosinophils Absolute: 0 10*3/uL (ref 0.0–0.5)
Eosinophils Relative: 0 %
HCT: 53.3 % — ABNORMAL HIGH (ref 39.0–52.0)
Hemoglobin: 17.1 g/dL — ABNORMAL HIGH (ref 13.0–17.0)
Immature Granulocytes: 0 %
Lymphocytes Relative: 12 %
Lymphs Abs: 1.4 10*3/uL (ref 0.7–4.0)
MCH: 28.6 pg (ref 26.0–34.0)
MCHC: 32.1 g/dL (ref 30.0–36.0)
MCV: 89.3 fL (ref 80.0–100.0)
Monocytes Absolute: 0.9 10*3/uL (ref 0.1–1.0)
Monocytes Relative: 8 %
NEUTROS ABS: 9 10*3/uL — AB (ref 1.7–7.7)
NEUTROS PCT: 79 %
Platelets: 257 10*3/uL (ref 150–400)
RBC: 5.97 MIL/uL — AB (ref 4.22–5.81)
RDW: 12.4 % (ref 11.5–15.5)
WBC: 11.4 10*3/uL — ABNORMAL HIGH (ref 4.0–10.5)
nRBC: 0 % (ref 0.0–0.2)

## 2018-11-27 LAB — MAGNESIUM: MAGNESIUM: 2 mg/dL (ref 1.7–2.4)

## 2018-11-27 LAB — SALICYLATE LEVEL: Salicylate Lvl: <7 mg/dL (ref 2.8–30.0)

## 2018-11-27 LAB — ETHANOL: Alcohol, Ethyl (B): <10 mg/dL (ref <10)

## 2018-11-27 LAB — ACETAMINOPHEN LEVEL: Acetaminophen (Tylenol), Serum: <10 ug/mL — ABNORMAL LOW (ref 10–30)

## 2018-11-27 MED ORDER — SODIUM CHLORIDE 0.9 % IV BOLUS
1000.0000 mL | Freq: Once | INTRAVENOUS | Status: AC
  Administered 2018-11-27: 1000 mL via INTRAVENOUS

## 2018-11-27 NOTE — ED Triage Notes (Addendum)
Patient arrived with EMS from home found lethargic / somnolent this evening , possible overdose - unknown substance , EMS found empty pill bottles of Olanzapine 20 mg and Perphenazine 10 mg , respirations unlabored at arrival but lethargic/sleepy . CBG= 124 .

## 2018-11-27 NOTE — ED Provider Notes (Signed)
Emergency Department Provider Note   I have reviewed the triage vital signs and the nursing notes.   HISTORY  Chief Complaint Possible Overdose   HPI [name] is a 48 y.o. male with PMH of Asthma, CHF, COPD, and Schizophrenia presents to the ED with intentional overdose.  Patient arrives by EMS.  He reportedly overdosed on Olanzapine, Perphenazine, and possibly other pills at an unknown time today.  Family arrived to check on him and could not get him to come to the door.  They called out for a wellness check and made entry into the apartment.  They found the patient somnolent and confused.  He had multiple empty bottles in his immediate vicinity including Perphenazine 4 mg tabs 60 count (empty) and two bottles of Olanzapine 5 mg tabs 30 count (nearly empty.  Patient will not tell me the time of overdose but does tell me this was intentional.  He will not comment on if he took this with alcohol or other prescription medication.  EMS did not see any benzodiazepine in his immediate vicinity.  They did see some hydrocodone.   Level 5 caveat: AMS   Past Medical History:  Diagnosis Date   Asthma    CHF (congestive heart failure) (HCC)    Chronic back pain    COPD (chronic obstructive pulmonary disease) (HCC)    Knee pain, chronic    Migraines    Schizophrenia, schizo-affective [facility])     Patient Active Problem List   Diagnosis Date Noted   Laceration of Achilles tendon, right, initial encounter    Shock ([place]) 08/28/2018   Suicide attempt ([place])    Benzodiazepine (tranquilizer) overdose, intentional self-harm, initial encounter ([place]) 06/13/2018   Overdose of benzodiazepine, intentional self-harm, initial encounter ([place])    Somnolence    Schizophrenia, paranoid type ([place]) 08/19/2017   Hypokalemia 04/28/2014   Drug overdose 04/27/2014   Schizoaffective disorder, bipolar type ([place]) 03/02/2014   Anxiety state 03/02/2014   Benzodiazepine dependence ([place])  03/02/2014   Chest pain 10/11/2013   EKG abnormalities 10/11/2013   Chronic back pain     Past Surgical History:  Procedure Laterality Date   KNEE SURGERY     four times   WISDOM TOOTH EXTRACTION     Allergies Amoxicillin; Dextromethorphan-guaifenesin; Haloperidol; Meloxicam; Nsaids; Prednisone; Quetiapine; Sudafed [pseudoephedrine hcl]; and Ziprasidone  Family History  Problem Relation Age of Onset   Hypertension Father     Social History Social History   Tobacco Use   Smoking status: Current Every Day Smoker    Packs/day: 2.00    Years: 33.00    Pack years: 66.00    Types: Cigarettes   Smokeless tobacco: Never Used  Substance Use Topics   Alcohol use: No    Comment: occ   Drug use: No    Review of Systems  Level 5 caveat: AMS   ____________________________________________   PHYSICAL EXAM:  VITAL SIGNS: Vitals:   11/27/18 2215 11/27/18 2230  BP: 103/77 102/78  Pulse: 78 78  Resp: 15 16  Temp:    SpO2: 95% 95%    Constitutional: Somnolent but following commands and speaking in short statements.  Eyes: Conjunctivae are normal. 2 mm and sluggish.  Head: Atraumatic. Nose: No congestion/rhinnorhea. Mouth/Throat: Mucous membranes are moist. Neck: No stridor.   Cardiovascular: Normal rate, regular rhythm. Good peripheral circulation. Grossly normal heart sounds.   Respiratory: Normal respiratory effort.  No retractions. Lungs CTAB. Gastrointestinal: Soft and nontender. No distention.  Musculoskeletal: No lower  extremity tenderness nor edema. No gross deformities of extremities. Neurologic: Slurred speech. No gross focal neurologic deficits are appreciated.  Skin:  Skin is warm, dry and intact. No rash noted.  ____________________________________________   LABS (all labs ordered are listed, but only abnormal results are displayed)  Labs Reviewed  ACETAMINOPHEN LEVEL - Abnormal; Notable for the following components:      Result Value    Acetaminophen (Tylenol), Serum <10 (*)    All other components within normal limits  COMPREHENSIVE METABOLIC PANEL - Abnormal; Notable for the following components:   Glucose, Bld 136 (*)    BUN <5 (*)    AST 54 (*)    All other components within normal limits  CBC WITH DIFFERENTIAL/PLATELET - Abnormal; Notable for the following components:   WBC 11.4 (*)    RBC 5.97 (*)    Hemoglobin 17.1 (*)    HCT 53.3 (*)    Neutro Abs 9.0 (*)    All other components within normal limits  ETHANOL  LIPASE, BLOOD  SALICYLATE LEVEL  MAGNESIUM  RAPID URINE DRUG SCREEN, HOSP PERFORMED   ____________________________________________  EKG   EKG Interpretation  Date/Time:  Saturday November 27 2018 21:19:50 EDT Ventricular Rate:  91 PR Interval:    QRS Duration: 92 QT Interval:  367 QTC Calculation: 452 R Axis:   81 Text Interpretation:  Sinus rhythm Consider left ventricular hypertrophy No STEMI.  Confirmed by Nanda Quinton [id number] on 11/27/2018 9:25:34 PM       ____________________________________________  RADIOLOGY  None  ____________________________________________   PROCEDURES  Procedure(s) performed:   Procedures  CRITICAL CARE Performed by: Margette Fast Total critical care time: 35 minutes Critical care time was exclusive of separately billable procedures and treating other patients. Critical care was necessary to treat or prevent imminent or life-threatening deterioration. Critical care was time spent personally by me on the following activities: development of treatment plan with patient and/or surrogate as well as nursing, discussions with consultants, evaluation of patient's response to treatment, examination of patient, obtaining history from patient or surrogate, ordering and performing treatments and interventions, ordering and review of laboratory studies, ordering and review of radiographic studies, pulse oximetry and re-evaluation of patient's condition.  Nanda Quinton, MD Emergency Medicine  ____________________________________________   INITIAL IMPRESSION / ASSESSMENT AND PLAN / ED COURSE  Pertinent labs & imaging results that were available during my care of the patient were reviewed by me and considered in my medical decision making (see chart for details).  Patient presents to the emergency department after intentional overdose.  He is somnolent but following commands.  He is able to open his airway wide secretions.  I called to speak with poison control.  EMS has started a case already.  They do not recommend charcoal at this time with unknown time of ingestion.  They recommend 8-hour observation, labs, EKG, cardiac monitoring.  Patient at risk for CNS depression, intermittent agitation, torsades, V. Tach, and hypotension.  Plan for IV fluids and screening labs are obtained.   09:50 PM  Patient remains somnolent.  He awakens to loud voice and gentle physical stimulation.  He is able to give me a thumbs up and follow commands.  No need for airway protection at this time.  Continue to follow.   10:55 PM  No change in mental status. Continues to awake and give a thumbs up when asked. No arrhythmia on monitor. Labs reviewed. Will be medically clear and ready for TTS evaluation at 5  AM.  ____________________________________________  FINAL CLINICAL IMPRESSION(S) / ED DIAGNOSES  Final diagnoses:  Intentional overdose of drug in tablet form (HCC)  Somnolence     MEDICATIONS GIVEN DURING THIS VISIT:  Medications  sodium chloride 0.9 % bolus 1,000 mL (0 mLs Intravenous Stopped 11/27/18 2231)  sodium chloride 0.9 % bolus 1,000 mL (1,000 mLs Intravenous New Bag/Given 11/27/18 2237)     Note:  This document was prepared using Dragon voice recognition software and may include unintentional dictation errors.  Nanda Quinton, MD Emergency Medicine   Doralene Glanz, [facility], MD 11/27/18 [phone]

## 2018-11-28 ENCOUNTER — Encounter (HOSPITAL_COMMUNITY): Payer: Self-pay | Admitting: Behavioral Health

## 2018-11-28 ENCOUNTER — Other Ambulatory Visit: Payer: Self-pay

## 2018-11-28 DIAGNOSIS — T50902A Poisoning by unspecified drugs, medicaments and biological substances, intentional self-harm, initial encounter: Secondary | ICD-10-CM | POA: Diagnosis not present

## 2018-11-28 LAB — RAPID URINE DRUG SCREEN, HOSP PERFORMED
Amphetamines: NOT DETECTED
Barbiturates: NOT DETECTED
Benzodiazepines: POSITIVE — AB
Cocaine: NOT DETECTED
Opiates: POSITIVE — AB
Tetrahydrocannabinol: NOT DETECTED

## 2018-11-28 MED ORDER — HALOPERIDOL LACTATE 5 MG/ML IJ SOLN: 5.0000 mg | Freq: Once | INTRAMUSCULAR | Status: DC

## 2018-11-28 MED ORDER — LORAZEPAM 2 MG/ML IJ SOLN
2.0000 mg | Freq: Once | INTRAMUSCULAR | Status: AC
  Administered 2018-11-28: 2 mg via INTRAMUSCULAR
  Filled 2018-11-28: qty 1

## 2018-11-28 MED ORDER — OLANZAPINE 10 MG PO TABS
20.0000 mg | ORAL_TABLET | Freq: Every day | ORAL | Status: DC
  Administered 2018-11-28: 20 mg via ORAL
  Filled 2018-11-28: qty 4

## 2018-11-28 MED ORDER — LORAZEPAM 2 MG/ML IJ SOLN
2.0000 mg | Freq: Once | INTRAMUSCULAR | Status: AC
  Administered 2018-11-28: 2 mg via INTRAVENOUS
  Filled 2018-11-28: qty 1

## 2018-11-28 MED ORDER — LORAZEPAM 1 MG PO TABS: 1.0000 mg | ORAL_TABLET | ORAL | Status: DC | PRN

## 2018-11-28 MED ORDER — ACETAMINOPHEN 325 MG PO TABS
650.0000 mg | ORAL_TABLET | ORAL | Status: DC | PRN
  Administered 2018-11-29: 650 mg via ORAL
  Filled 2018-11-28: qty 2

## 2018-11-28 NOTE — ED Notes (Signed)
Pt mom Merrilyn Puma would like an update when possible.

## 2018-11-28 NOTE — ED Notes (Signed)
Called pt's mother, Fraser Din, as pt requested and advised he is at [facility]. Mother advised she has spoken w/pt x 2 today and is aware - also stated she appreciated the call. She advised she is unable to come visit w/pt as p had requested d/t fear of Coronavirus as she is in her 90's. Pt ambulatory to nurses' desk attempting to "answer phone" d/t call bell ringing. Pt returned to room after much encouragement from staff.

## 2018-11-28 NOTE — ED Notes (Signed)
Sitter has arrived to bedside.  

## 2018-11-28 NOTE — ED Notes (Signed)
Pt arrived to Rm 50 - via stretcher - wearing burgundy scrubs. Staffing Office aware of need for Sitter.

## 2018-11-28 NOTE — ED Notes (Signed)
Gave pt Sprite, per Jacqlyn Larsen - RN.

## 2018-11-28 NOTE — BH Assessment (Signed)
Attempted tele-assessment but Pt appears drowsy, agitated and his speech is so slurred it is almost unintelligible. Pt is unable to participate in assessment at this time. TTS will complete assessment when Pt is less drowsy.   Evelena Peat, [facility], [facility], [facility] Triage Specialist [phone]

## 2018-11-28 NOTE — ED Notes (Signed)
Pt used bathroom. Pt was given a cup of water and tech cleaned pt dentures. Pt is back in bed with both eyes closed.

## 2018-11-28 NOTE — ED Notes (Signed)
Lunch tray ordered 

## 2018-11-28 NOTE — ED Provider Notes (Signed)
Patient present in the ER s/p intentional overdose. He has been evaluated by [facility] w/ recommendation for inpatient admission, pending placement. I briefly saw patient with attending physician Dr. Laverta Baltimore last night therefore I am familiar with patient case.    15: 20: RN caring for patient informed me that he is escalating, becoming agitated, threatening to leave. IVC paperwork filled out given patient here s/p intentional overdose as suicide attempt last evening, RN requested medication to assist w/ agitation, ativan ordered.    Leafy Kindle 11/28/18 2243    Pattricia Boss, MD 12/04/18 [phone]

## 2018-11-28 NOTE — BH Assessment (Deleted)
Attempted tele-assessment but Pt appears drowsy, agitated and his speech is so slurred it is almost unintelligible. Pt is unable to participate in assessment at this time. TTS will complete assessment when Pt is less drowsy.   Evelena Peat, [facility], [facility], [facility] Triage Specialist [phone]

## 2018-11-28 NOTE — ED Notes (Signed)
Pt attempted to ambulate to desk to use phone - advised pt he has already placed his 2 phone calls for the day. Pt returned to room as directed.

## 2018-11-28 NOTE — ED Notes (Addendum)
Pt. wearing maroon paper scrubs , clothes bagged and stored at [employer] [address] . Patient's valuables( keys/bracelet/watch) stored at security safe.

## 2018-11-28 NOTE — ED Notes (Signed)
TTS attempted however the pt was too groggy to speak.

## 2018-11-28 NOTE — BH Assessment (Signed)
Tele Assessment Note   Patient Name: [name] MRN: [id number] Referring Physician: [facility] Location of Patient: [facility] Location of Provider: Behavioral Health TTS Department  [name] Hargan is a 48 y.o. male who presented to [facility] on voluntary basis (transported by EMS) due to intentional overdose.  Pt was found by family members in his own apartment, and they could not rouse him.  Pt lives alone in [place].  He is on disability.  Pt indicated that he was previously followed by [facility], but he has not been there in several months.  Pt was last assessed by TTS in October 2019.    Per hospital note:  PMH of Asthma, CHF, COPD, and Schizophrenia presents to the ED with intentional overdose.  Patient arrives by EMS.  He reportedly overdosed on Olanzapine, Perphenazine, and possibly other pills at an unknown time today.  Family arrived to check on him and could not get him to come to the door.  They called out for a wellness check and made entry into the apartment.  They found the patient somnolent and confused.  He had multiple empty bottles in his immediate vicinity including Perphenazine 4 mg tabs 60 count (empty) and two bottles of Olanzapine 5 mg tabs 30 count (nearly empty.  Patient will not tell me the time of overdose but does tell me this was intentional.  He will not comment on if he took this with alcohol or other prescription medication.  EMS did not see any benzodiazepine in his immediate vicinity.  They did see some hydrocodone.   Pt was very drowsy and slurred of speech.  Pt reported that he intentionally overdosed on the aforementioned medication, but he cannot recall why.  Pt acknowledged that he is suicidal, and he stated that he has attempted suicide at least 60 times before, with last attempt being in December.  Pt was treated inpatient at [facility].  Pt endorsed persistent and unremitting despondency, fatigue, isolation, and a sense of hopelessness and worthlessness.  Pt also  endorsed hallucination (visual and auditory).  Pt denied substance use, homicidal ideation, and self-injurious behavior.    Author spoke with Pt's mother who reported that Pt told her today:  ''I want to die.  Why can't you understand?"  Mother stated that family could not reach Pt for several days and found him yesterday in a state of unconsciousness.  They could not rouse him.  During assessment, Pt presented as alert and oriented.  He had poor eye contact and was not oriented to situation.  Pt endorsed ongoing suicidal ideation, despondency, and other symptoms.  Speech was soft and slurred.  Thought processes were slowed.  Thought content was logical.  There was no evidence of delusion.  Pt's memory and concentration were poor.  Impulse control, judgment, and insight were deemed poor.  Consulted with T. Money, NP, who determined that Pt meets inpatient criteria.   Diagnosis: Schizophrenia, Schizo-affective disorder   Past Medical History:  Past Medical History:  Diagnosis Date  . Asthma   . CHF (congestive heart failure) ([place])   . Chronic back pain   . COPD (chronic obstructive pulmonary disease) ([place])   . Knee pain, chronic   . Migraines   . Schizophrenia, schizo-affective ([place])     Past Surgical History:  Procedure Laterality Date  . KNEE SURGERY     four times  . WISDOM TOOTH EXTRACTION      Family History:  Family History  Problem Relation Age of Onset  .  Hypertension Father     Social History:  reports that he has been smoking cigarettes. He has a 66.00 pack-year smoking history. He has never used smokeless tobacco. He reports that he does not drink alcohol or use drugs.  Additional Social History:  Alcohol / Drug Use Pain Medications: See MAR Prescriptions: See MAR Over the Counter: See MAR History of alcohol / drug use?: No history of alcohol / drug abuse  CIWA: CIWA-Ar BP: (!) 123/91 Pulse Rate: 76 COWS:    Allergies:  Allergies  Allergen Reactions  .  Amoxicillin Diarrhea    Has patient had a PCN reaction causing immediate rash, facial/tongue/throat swelling, SOB or lightheadedness with hypotension: No Has patient had a PCN reaction causing severe rash involving mucus membranes or skin necrosis: No Has patient had a PCN reaction that required hospitalization: No Has patient had a PCN reaction occurring within the last 10 years: No If all of the above answers are "NO", then may proceed with Cephalosporin use.   Marland Kitchen Dextromethorphan-Guaifenesin Other (See Comments)    unknown  . Haloperidol Other (See Comments)    unspecified  . Meloxicam Other (See Comments)    unspecified  . Nsaids Other (See Comments)    Rectal bleeding  . Prednisone Other (See Comments)    unspecified  . Quetiapine Other (See Comments)    Psychosis with high dose (600mg )  . Sudafed [Pseudoephedrine Hcl] Other (See Comments)    Hallucinations  . Ziprasidone Other (See Comments)    unspecified    Home Medications: (Not in a hospital admission)   OB/GYN Status:  No LMP for male patient.  General Assessment Data Assessment unable to be completed: Yes Reason for not completing assessment: Pt too drowsy to answer questions. Location of Assessment: [facility] ED TTS Assessment: In system Is this a Tele or Face-to-Face Assessment?: Tele Assessment Is this an Initial Assessment or a Re-assessment for this encounter?: Initial Assessment Patient Accompanied by:: N/A Language Other than English: No Living Arrangements: Other (Comment)(Lives by self) What gender do you identify as?: Male Marital status: Widowed Pregnancy Status: No Living Arrangements: Alone Can pt return to current living arrangement?: Yes Admission Status: Voluntary Is patient capable of signing voluntary admission?: Yes Referral Source: Self/Family/Friend Insurance type: [facility]     Crisis Care Plan Living Arrangements: Alone Name of Psychiatrist: Dr. Genella Rife not been in a long  time)  Education Status Is patient currently in school?: No Is the patient employed, unemployed or receiving disability?: Receiving disability income  Risk to self with the past 6 months Suicidal Ideation: Yes-Currently Present Has patient been a risk to self within the past 6 months prior to admission? : Yes Suicidal Intent: Yes-Currently Present Has patient had any suicidal intent within the past 6 months prior to admission? : Yes Is patient at risk for suicide?: Yes Suicidal Plan?: Yes-Currently Present Has patient had any suicidal plan within the past 6 months prior to admission? : Yes Specify Current Suicidal Plan: Pt intentionally overdosed on numerous meds Access to Means: Yes Specify Access to Suicidal Means: Prescribed and OTC meds What has been your use of drugs/alcohol within the last 12 months?: Denied Previous Attempts/Gestures: Yes How many times?: (''About 60 times'') Triggers for Past Attempts: Unknown Intentional Self Injurious Behavior: None Family Suicide History: Unknown Recent stressful life event(s): Other (Comment)(''Unknown'' -- Pt could not recall why he ODd) Persecutory voices/beliefs?: No Depression: Yes Depression Symptoms: Despondent, Feeling worthless/self pity, Loss of interest in usual pleasures, Isolating Substance abuse history  and/or treatment for substance abuse?: No Suicide prevention information given to non-admitted patients: Not applicable  Risk to Others within the past 6 months Homicidal Ideation: No Does patient have any lifetime risk of violence toward others beyond the six months prior to admission? : No Thoughts of Harm to Others: No Current Homicidal Intent: No Current Homicidal Plan: No Access to Homicidal Means: No History of harm to others?: No Assessment of Violence: None Noted Does patient have access to weapons?: No Criminal Charges Pending?: Yes Describe Pending Criminal Charges: DWI, mis. assault on male Does patient  have a court date: Yes Court Date: 12/03/18 Is patient on probation?: No  Psychosis Hallucinations: Auditory, Visual(''I see and hear stuff coming at me'') Delusions: None noted  Mental Status Report Appearance/Hygiene: In scrubs, Disheveled Eye Contact: Poor Motor Activity: Freedom of movement, Unremarkable Speech: Slurred, Slow Level of Consciousness: Drowsy Mood: Ambivalent, Sad Affect: Appropriate to circumstance Anxiety Level: None Thought Processes: Tangential Judgement: Impaired Orientation: Person, Place, Time Obsessive Compulsive Thoughts/Behaviors: None  Cognitive Functioning Concentration: Poor Memory: Remote Impaired, Recent Impaired Is patient IDD: No Insight: Poor Impulse Control: Poor  ADLScreening [facility] Assessment Services) Patient's cognitive ability adequate to safely complete daily activities?: Yes Patient able to express need for assistance with ADLs?: Yes Independently performs ADLs?: Yes (appropriate for developmental age)  Prior Inpatient Therapy Prior Inpatient Therapy: Yes Prior Therapy Dates: 2019 and other Prior Therapy Facilty/Provider(s): [facility] and other Reason for Treatment: Schizoaffective Disorder, Bipolar Type  Prior Outpatient Therapy Prior Outpatient Therapy: Yes Prior Therapy Dates: 2019 Prior Therapy Facilty/Provider(s): Dr. Darleene Cleaver Reason for Treatment: Schizoaffective Disorder Does patient have an ACCT team?: No Does patient have Intensive In-House Services?  : No Does patient have [facility] services? : No Does patient have P4CC services?: No  ADL Screening (condition at time of admission) Patient's cognitive ability adequate to safely complete daily activities?: Yes Is the patient deaf or have difficulty hearing?: No Does the patient have difficulty seeing, even when wearing glasses/contacts?: No Does the patient have difficulty concentrating, remembering, or making decisions?: No Patient able to express need for assistance  with ADLs?: Yes Does the patient have difficulty dressing or bathing?: No Independently performs ADLs?: Yes (appropriate for developmental age) Does the patient have difficulty walking or climbing stairs?: No Weakness of Legs: None Weakness of Arms/Hands: None  Home Assistive Devices/Equipment Home Assistive Devices/Equipment: None  Therapy Consults (therapy consults require a physician order) PT Evaluation Needed: No OT Evalulation Needed: No SLP Evaluation Needed: No Abuse/Neglect Assessment (Assessment to be complete while patient is alone) Abuse/Neglect Assessment Can Be Completed: Yes Physical Abuse: Denies Verbal Abuse: Denies Sexual Abuse: Denies Exploitation of patient/patient's resources: Denies Self-Neglect: Denies Values / Beliefs Cultural Requests During Hospitalization: None Spiritual Requests During Hospitalization: None Consults Spiritual Care Consult Needed: No Social Work Consult Needed: No Regulatory affairs officer (For Healthcare) Does Patient Have a Medical Advance Directive?: No          Disposition:  Disposition Initial Assessment Completed for this Encounter: Yes Disposition of Patient: Admit Type of inpatient treatment program: Adult(Per T. Money, NP, PT meets inpt criteria)  This service was provided via telemedicine using a 2-way, interactive audio and Radiographer, therapeutic.  Names of all persons participating in this telemedicine service and their role in this encounter. Name: Weight, [name] Role: Pt  Name: Judd Lien Role: Pt's mother          Marlowe Aschoff 11/28/2018 12:58 PM

## 2018-11-28 NOTE — ED Notes (Signed)
Pt now awake and drinking po fluids. [facility] SW aware pt is ready for TTS.

## 2018-11-28 NOTE — ED Notes (Signed)
Unable to complete TTS evaluation  , pt. is somnolent /sleepy during interview will attempt again in the morning when pt. is more alert/awake.

## 2018-11-28 NOTE — ED Notes (Signed)
TTS video interview in progress .  

## 2018-11-28 NOTE — ED Notes (Signed)
Pt ambulated to bathroom and on his way back to room - stated "I want to voluntarily commit myself". Pt noted w/upper dentures in his mouth - loosely fitting.

## 2018-11-28 NOTE — [facility] Counselor (Signed)
Attempted assessment.  Pt still groggy.

## 2018-11-28 NOTE — ED Notes (Signed)
Per Jerel Shepherd, [place] - pt has been accepted - Accepting MD - Dr Kenn File for Dr Renard Hamper - Room 365-C - Call report to [phone]. Aware pt is under IVC and will be transported tomorrow (11/29/2018). RN called and left message for [facility] Deputy notifying of need for transport.

## 2018-11-28 NOTE — ED Notes (Signed)
Patient assisted to use a urinal but unable to give urine specimen .

## 2018-11-28 NOTE — Progress Notes (Signed)
Patient meets criteria for inpatient treatment. No appropriate or available beds at [facility]. CSW faxed referrals to the following facilities for review:  [facility]  [facility]  [facility]  [facility]  [facility]  [facility]  [facility]   TTS will continue to seek bed placement.  Chalmers Guest. Guerry Bruin, MSW, [place] Work/Disposition Phone: [phone] Fax: [phone]

## 2018-11-28 NOTE — ED Notes (Signed)
Dinner tray ordered.

## 2018-11-28 NOTE — ED Notes (Signed)
Pt completed his TTS. Pt endorsing SI via medication overdose to this RN.

## 2018-11-28 NOTE — ED Notes (Signed)
Pt ambulatory to nurses' desk - cursing and stating loudly that he is going to leave and hit fist on counter - no injury noted. Pt returned to room as directed. Order received for Ativan - given - pt tolerated well - laid on bed. Sitter at bedside.

## 2018-11-28 NOTE — ED Notes (Signed)
Gave pt Sprite, per [employer] - RN.

## 2018-11-28 NOTE — ED Notes (Signed)
Pt has made his 2 phone calls for the day. Pt talks to himself continuously.

## 2018-11-28 NOTE — ED Notes (Addendum)
EDP notified that pt. can not give urine specimen at this time . No new order received , TTS video monitor set up at bedside .

## 2018-11-28 NOTE — ED Notes (Signed)
Pt began escalating verbally.  Dr. Darl Householder contacted for orders.  [employer] to bedside to ensure safety as injection was given.  Will continue to monitor.

## 2018-11-28 NOTE — ED Notes (Signed)
IVC papers served - copy faxed to [facility], copy sent to Medical Records, original placed in folder for Magistrate, and all 3 sets on clipboard.

## 2018-11-29 NOTE — ED Notes (Signed)
Report called to [facility], Cephus Richer RN taking report

## 2018-11-29 NOTE — ED Notes (Signed)
Pt gave verbal permission to update mother of transfer

## 2019-02-26 ENCOUNTER — Emergency Department (HOSPITAL_COMMUNITY): Payer: Medicare Other

## 2019-02-26 ENCOUNTER — Encounter (HOSPITAL_COMMUNITY): Payer: Self-pay | Admitting: Emergency Medicine

## 2019-02-26 ENCOUNTER — Inpatient Hospital Stay (HOSPITAL_COMMUNITY)
Admission: EM | Admit: 2019-02-26 | Discharge: 2019-03-04 | DRG: 917 | Disposition: A | Discharge status: Other Acute Inpatient Hospital | Payer: Medicare Other | Attending: Internal Medicine | Admitting: Internal Medicine

## 2019-02-26 DIAGNOSIS — J690 Pneumonitis due to inhalation of food and vomit: Secondary | ICD-10-CM | POA: Diagnosis present

## 2019-02-26 DIAGNOSIS — Z1159 Encounter for screening for other viral diseases: Secondary | ICD-10-CM | POA: Diagnosis not present

## 2019-02-26 DIAGNOSIS — J449 Chronic obstructive pulmonary disease, unspecified: Secondary | ICD-10-CM | POA: Diagnosis present

## 2019-02-26 DIAGNOSIS — T424X2A Poisoning by benzodiazepines, intentional self-harm, initial encounter: Principal | ICD-10-CM | POA: Diagnosis present

## 2019-02-26 DIAGNOSIS — F411 Generalized anxiety disorder: Secondary | ICD-10-CM | POA: Diagnosis present

## 2019-02-26 DIAGNOSIS — T50902D Poisoning by unspecified drugs, medicaments and biological substances, intentional self-harm, subsequent encounter: Secondary | ICD-10-CM | POA: Diagnosis not present

## 2019-02-26 DIAGNOSIS — F200 Paranoid schizophrenia: Secondary | ICD-10-CM | POA: Diagnosis present

## 2019-02-26 DIAGNOSIS — T50902S Poisoning by unspecified drugs, medicaments and biological substances, intentional self-harm, sequela: Secondary | ICD-10-CM

## 2019-02-26 DIAGNOSIS — J9600 Acute respiratory failure, unspecified whether with hypoxia or hypercapnia: Secondary | ICD-10-CM

## 2019-02-26 DIAGNOSIS — F19188 Other psychoactive substance abuse with other psychoactive substance-induced disorder: Secondary | ICD-10-CM | POA: Diagnosis not present

## 2019-02-26 DIAGNOSIS — Z79899 Other long term (current) drug therapy: Secondary | ICD-10-CM

## 2019-02-26 DIAGNOSIS — T50902A Poisoning by unspecified drugs, medicaments and biological substances, intentional self-harm, initial encounter: Secondary | ICD-10-CM | POA: Diagnosis not present

## 2019-02-26 DIAGNOSIS — R402112 Coma scale, eyes open, never, at arrival to emergency department: Secondary | ICD-10-CM | POA: Diagnosis present

## 2019-02-26 DIAGNOSIS — F1910 Other psychoactive substance abuse, uncomplicated: Secondary | ICD-10-CM | POA: Diagnosis not present

## 2019-02-26 DIAGNOSIS — R402212 Coma scale, best verbal response, none, at arrival to emergency department: Secondary | ICD-10-CM | POA: Diagnosis present

## 2019-02-26 DIAGNOSIS — J189 Pneumonia, unspecified organism: Secondary | ICD-10-CM

## 2019-02-26 DIAGNOSIS — R402342 Coma scale, best motor response, flexion withdrawal, at arrival to emergency department: Secondary | ICD-10-CM | POA: Diagnosis present

## 2019-02-26 DIAGNOSIS — T1491XA Suicide attempt, initial encounter: Secondary | ICD-10-CM | POA: Diagnosis not present

## 2019-02-26 DIAGNOSIS — J9601 Acute respiratory failure with hypoxia: Secondary | ICD-10-CM

## 2019-02-26 DIAGNOSIS — Z915 Personal history of self-harm: Secondary | ICD-10-CM

## 2019-02-26 DIAGNOSIS — F17210 Nicotine dependence, cigarettes, uncomplicated: Secondary | ICD-10-CM | POA: Diagnosis present

## 2019-02-26 DIAGNOSIS — G92 Toxic encephalopathy: Secondary | ICD-10-CM | POA: Diagnosis present

## 2019-02-26 DIAGNOSIS — R4182 Altered mental status, unspecified: Secondary | ICD-10-CM | POA: Diagnosis present

## 2019-02-26 DIAGNOSIS — R4020 Unspecified coma: Secondary | ICD-10-CM | POA: Diagnosis present

## 2019-02-26 DIAGNOSIS — F1920 Other psychoactive substance dependence, uncomplicated: Secondary | ICD-10-CM | POA: Diagnosis present

## 2019-02-26 DIAGNOSIS — T40602A Poisoning by unspecified narcotics, intentional self-harm, initial encounter: Secondary | ICD-10-CM | POA: Diagnosis present

## 2019-02-26 DIAGNOSIS — T50901A Poisoning by unspecified drugs, medicaments and biological substances, accidental (unintentional), initial encounter: Secondary | ICD-10-CM | POA: Diagnosis present

## 2019-02-26 DIAGNOSIS — R451 Restlessness and agitation: Secondary | ICD-10-CM | POA: Diagnosis not present

## 2019-02-26 DIAGNOSIS — R440 Auditory hallucinations: Secondary | ICD-10-CM | POA: Diagnosis not present

## 2019-02-26 LAB — COMPREHENSIVE METABOLIC PANEL
ALT: 37 U/L (ref 0–44)
AST: 38 U/L (ref 15–41)
Albumin: 3.9 g/dL (ref 3.5–5.0)
Alkaline Phosphatase: 53 U/L (ref 38–126)
Anion gap: 8 (ref 5–15)
BUN: 6 mg/dL (ref 6–20)
CO2: 25 mmol/L (ref 22–32)
Calcium: 9.2 mg/dL (ref 8.9–10.3)
Chloride: 108 mmol/L (ref 98–111)
Creatinine, Ser: 0.87 mg/dL (ref 0.61–1.24)
GFR calc Af Amer: >60 mL/min (ref >60)
GFR calc non Af Amer: >60 mL/min (ref >60)
Glucose, Bld: 111 mg/dL — ABNORMAL HIGH (ref 70–99)
Potassium: 4.8 mmol/L (ref 3.5–5.1)
Sodium: 141 mmol/L (ref 135–145)
Total Bilirubin: 1.4 mg/dL — ABNORMAL HIGH (ref 0.3–1.2)
Total Protein: 6.6 g/dL (ref 6.5–8.1)

## 2019-02-26 LAB — POCT I-STAT 7, (LYTES, BLD GAS, ICA,H+H)
Bicarbonate: 26.6 mmol/L (ref 20.0–28.0)
Calcium, Ion: 1.27 mmol/L (ref 1.15–1.40)
HCT: 47 % (ref 39.0–52.0)
Hemoglobin: 16 g/dL (ref 13.0–17.0)
O2 Saturation: 96 %
Patient temperature: 96
Potassium: 3.5 mmol/L (ref 3.5–5.1)
Sodium: 143 mmol/L (ref 135–145)
TCO2: 28 mmol/L (ref 22–32)
pCO2 arterial: 47.1 mmHg (ref 32.0–48.0)
pH, Arterial: 7.354 (ref 7.350–7.450)
pO2, Arterial: 81 mmHg — ABNORMAL LOW (ref 83.0–108.0)

## 2019-02-26 LAB — RAPID URINE DRUG SCREEN, HOSP PERFORMED
Amphetamines: NOT DETECTED
Barbiturates: NOT DETECTED
Benzodiazepines: POSITIVE — AB
Cocaine: NOT DETECTED
Opiates: POSITIVE — AB
Tetrahydrocannabinol: NOT DETECTED

## 2019-02-26 LAB — CBC
HCT: 52 % (ref 39.0–52.0)
Hemoglobin: 16.9 g/dL (ref 13.0–17.0)
MCH: 29.9 pg (ref 26.0–34.0)
MCHC: 32.5 g/dL (ref 30.0–36.0)
MCV: 92 fL (ref 80.0–100.0)
Platelets: 173 10*3/uL (ref 150–400)
RBC: 5.65 MIL/uL (ref 4.22–5.81)
RDW: 13.1 % (ref 11.5–15.5)
WBC: 5.9 10*3/uL (ref 4.0–10.5)
nRBC: 0 % (ref 0.0–0.2)

## 2019-02-26 LAB — URINALYSIS, ROUTINE W REFLEX MICROSCOPIC
Bilirubin Urine: NEGATIVE
Glucose, UA: NEGATIVE mg/dL
Hgb urine dipstick: NEGATIVE
Ketones, ur: NEGATIVE mg/dL
Leukocytes,Ua: NEGATIVE
Nitrite: NEGATIVE
Protein, ur: NEGATIVE mg/dL
Specific Gravity, Urine: 1.012 (ref 1.005–1.030)
pH: 6 (ref 5.0–8.0)

## 2019-02-26 LAB — SALICYLATE LEVEL: Salicylate Lvl: <7 mg/dL (ref 2.8–30.0)

## 2019-02-26 LAB — CBG MONITORING, ED: Glucose-Capillary: 90 mg/dL (ref 70–99)

## 2019-02-26 LAB — ACETAMINOPHEN LEVEL
Acetaminophen (Tylenol), Serum: <10 ug/mL — ABNORMAL LOW (ref 10–30)
Acetaminophen (Tylenol), Serum: <10 ug/mL — ABNORMAL LOW (ref 10–30)

## 2019-02-26 LAB — SARS CORONAVIRUS 2 BY RT PCR (HOSPITAL ORDER, PERFORMED IN ~~LOC~~ HOSPITAL LAB): SARS Coronavirus 2: NEGATIVE

## 2019-02-26 LAB — ETHANOL: Alcohol, Ethyl (B): <10 mg/dL (ref <10)

## 2019-02-26 MED ORDER — NALOXONE HCL 2 MG/2ML IJ SOSY
2.0000 mg | PREFILLED_SYRINGE | Freq: Once | INTRAMUSCULAR | Status: AC
  Administered 2019-02-26: 2 mg via INTRAVENOUS

## 2019-02-26 MED ORDER — ENOXAPARIN SODIUM 40 MG/0.4ML ~~LOC~~ SOLN: 40.0000 mg | SUBCUTANEOUS | Status: DC

## 2019-02-26 MED ORDER — ROCURONIUM BROMIDE 50 MG/5ML IV SOLN
INTRAVENOUS | Status: AC | PRN
  Administered 2019-02-26: 100 mg via INTRAVENOUS

## 2019-02-26 MED ORDER — PANTOPRAZOLE SODIUM 40 MG IV SOLR
40.0000 mg | Freq: Every day | INTRAVENOUS | Status: DC
  Administered 2019-02-26 – 2019-02-27 (×2): 40 mg via INTRAVENOUS
  Filled 2019-02-26 (×2): qty 40

## 2019-02-26 MED ORDER — NALOXONE HCL 0.4 MG/ML IJ SOLN
INTRAMUSCULAR | Status: AC
  Filled 2019-02-26: qty 1

## 2019-02-26 MED ORDER — NALOXONE HCL 2 MG/2ML IJ SOSY
PREFILLED_SYRINGE | INTRAMUSCULAR | Status: AC
  Filled 2019-02-26: qty 10

## 2019-02-26 MED ORDER — NALOXONE HCL 0.4 MG/ML IJ SOLN
0.4000 mg | Freq: Once | INTRAMUSCULAR | Status: AC
  Administered 2019-02-26: 0.4 mg via INTRAVENOUS

## 2019-02-26 MED ORDER — IPRATROPIUM BROMIDE 0.02 % IN SOLN: 0.5000 mg | Freq: Four times a day (QID) | RESPIRATORY_TRACT | Status: DC | PRN

## 2019-02-26 MED ORDER — PROPOFOL 1000 MG/100ML IV EMUL: 5.0000 ug/kg/min | INTRAVENOUS | Status: DC

## 2019-02-26 MED ORDER — SODIUM CHLORIDE 0.9 % IV SOLN
INTRAVENOUS | Status: AC | PRN
  Administered 2019-02-26: 1000 mL via INTRAVENOUS

## 2019-02-26 MED ORDER — PROPOFOL 1000 MG/100ML IV EMUL
INTRAVENOUS | Status: AC
  Filled 2019-02-26: qty 100

## 2019-02-26 MED ORDER — LACTATED RINGERS IV SOLN
INTRAVENOUS | Status: DC
  Administered 2019-02-26 – 2019-03-01 (×4): via INTRAVENOUS

## 2019-02-26 MED ORDER — CHLORHEXIDINE GLUCONATE CLOTH 2 % EX PADS
6.0000 | MEDICATED_PAD | Freq: Every day | CUTANEOUS | Status: DC
  Administered 2019-02-28: 6 via TOPICAL

## 2019-02-26 MED ORDER — ETOMIDATE 2 MG/ML IV SOLN
INTRAVENOUS | Status: AC | PRN
  Administered 2019-02-26: 10 mg via INTRAVENOUS

## 2019-02-26 NOTE — ED Notes (Signed)
Pt brought back to room from CT by this RN and RT. Tolerated well.

## 2019-02-26 NOTE — ED Notes (Signed)
Dr Ronnald Nian ordered to give 2mg  narcan at a time up to 10mg  to see if pt becomes responsive.

## 2019-02-26 NOTE — ED Notes (Signed)
Portable in room.  

## 2019-02-26 NOTE — ED Notes (Signed)
Pt taken to CT by this RN and CT.

## 2019-02-26 NOTE — ED Notes (Signed)
Pt remains asleep

## 2019-02-26 NOTE — ED Provider Notes (Signed)
I have personally seen and examined the patient. I have reviewed the documentation on PMH/FH/Soc Hx. I have discussed the plan of care with the resident and patient.  I have reviewed and agree with the resident's documentation. Please see associated encounter note.  Briefly, the patient is a 48 y.o. male here with history of multiple overdoses.  Presents the ED after attempted suicide by ingestion.  EMS states empty hydrocodone and benzodiazepine bottles.  Patient has history of the same.  Patient had CPR briefly by police as it cannot palpate a pulse but upon arrival EMS had pulses and patient had shallow breathing.  He was placed on a nonrebreather.  Patient has GCS of 3 upon arrival.  He was given point milligrams of Narcan IM by police with not much effect.  Patient arrives with pinpoint pupils.  GCS is 3.  Patient was given multiple rounds of Narcan and started to move some extremities but continued to not be able to protect his airway.  Patient never opened his eyes, never spoke.  Therefore patient was intubated by my resident.  I was there for observation and for assistance.  CT scan of the head was unremarkable.  Chest x-ray showed adequate ET tube placement.  No pneumothorax.  Drug screen is positive for opioids and benzos.  Of note patient recently filled a large prescription of benzodiazepines.  Alcohol level negative.  Salicylate and Tylenol level negative.  Patient with normal hemodynamics after intubation.  Blood gas after intubation is reassuring.  Patient admitted for polysubstance overdose from opioids and benzos.  EKG showed normal intervals.  This chart was dictated using voice recognition software.  Despite best efforts to proofread,  errors can occur which can change the documentation meaning.     EKG Interpretation  Date/Time:  Saturday February 26 2019 17:57:18 EDT Ventricular Rate:  76 PR Interval:    QRS Duration: 91 QT Interval:  376 QTC Calculation: 423 R Axis:   87 Text  Interpretation:  Sinus rhythm Borderline repolarization abnormality Confirmed by Lennice Sites [phone]) on 02/26/2019 8:01:33 PM       .Critical Care Performed by: Lennice Sites, DO Authorized by: Lennice Sites, DO   Critical care provider statement:    Critical care time (minutes):  40   Critical care time was exclusive of:  Separately billable procedures and treating other patients and teaching time   Critical care was necessary to treat or prevent imminent or life-threatening deterioration of the following conditions:  Respiratory failure and CNS failure or compromise   Critical care was time spent personally by me on the following activities:  Blood draw for specimens, development of treatment plan with patient or surrogate, discussions with primary provider, evaluation of patient's response to treatment, examination of patient, obtaining history from patient or surrogate, ordering and performing treatments and interventions, ordering and review of laboratory studies, ordering and review of radiographic studies, pulse oximetry, re-evaluation of patient's condition and review of old charts   I assumed direction of critical care for this patient from another provider in my specialty: no        Lennice Sites, DO 02/26/19 2001

## 2019-02-26 NOTE — ED Notes (Signed)
The ptis on a vent he has not moved or spoken since his arrival  He is a medicine overdose  One is xanax and other meds

## 2019-02-26 NOTE — ED Notes (Signed)
No response ver\bal or otherwise  Responds to painful stimuli only  t 96.3 temporal

## 2019-02-26 NOTE — ED Notes (Signed)
Pt's son provided update.

## 2019-02-26 NOTE — ED Notes (Signed)
Mittens placed on the pts hands in case he attempts to pull out his lines  No movement anywhere at  present

## 2019-02-26 NOTE — Progress Notes (Signed)
RT note: RT and Rn transported patient on vent to CT and back to ED. Vital signs stable through out.

## 2019-02-26 NOTE — H&P (Addendum)
..   NAME:  [name], MRN:  [id number], DOB:  Nov 10, 1970, LOS: 0 ADMISSION DATE:  02/26/2019, CONSULTATION DATE:  02/26/2019 REFERRING MD:  Ronnald Nian MD, CHIEF COMPLAINT:  Altered mental status   Brief History   48 yo M w/ schizoaffective disorder presents to [facility] with Altered mental status post an intentional overdose. Endotracheally intubated when pt did not respond to Narcan. PCCM asked to admit  History of present illness   ( History obtained from EMR and acct of other providers)  48 yo M w/ PMHx COPD, Asthma, Schizoaffective w/ past suicide attempts the most recent documented in EMR was n March 2020. Per EMS they were called to this patient's home one week ago for suicidal ideation.  Per Alta Corning note: Pt's son found him lying on the floor at 1715 with an empty bottle of hydrocodone and other pill bottles in the vicinity. Last known normal was before 1615. The ingestion was not witnessed. Family thought pt was initially in cardiac arrest and started CPR but on arrival of EMD pt was found to have shallow breathing and a weak pulse.  There was a note written on the skin of the patient's chest that said "please DNR." EMS administered intranasal narcan which improved pt's resp rate.  Per EDP note: Patient was prescribed Norco 10 mg and Xanax 1 mg, also prescribed Zyprexa, and Seroquel. It can not be confirmed at this time how much or what pt took exactly.  Third party report: "Patient last had more than 100 Xanax tablets prescribed, dispensed a few weeks ago, had around 75 Norco's filled a few weeks ago, all pill bottles empty per EMS and family. Attempting to actually obtain pill bottles from family"  Past Medical History  .Marland Kitchen Active Ambulatory Problems    Diagnosis Date Noted  . Chest pain 10/11/2013  . Chronic back pain   . EKG abnormalities 10/11/2013  . Schizoaffective disorder, bipolar type ([place]) 03/02/2014  . Anxiety state 03/02/2014  . Benzodiazepine dependence ([place]) 03/02/2014  .  Drug overdose 04/27/2014  . Hypokalemia 04/28/2014  . Schizophrenia, paranoid type ([place]) 08/19/2017  . Overdose of benzodiazepine, intentional self-harm, initial encounter ([place])   . Somnolence   . Benzodiazepine (tranquilizer) overdose, intentional self-harm, initial encounter ([place]) 06/13/2018  . Suicide attempt ([place])   . Shock ([place]) 08/28/2018  . Laceration of Achilles tendon, right, initial encounter    Resolved Ambulatory Problems    Diagnosis Date Noted  . Diarrhea 07/13/2009  . Schizophrenia, schizo-affective ([place])   . Schizoaffective disorder ([place]) 02/26/2014  . Psychosis ([place]) 02/27/2014  . Schizoaffective disorder-chronic with exacerbation ([place]) 07/29/2014  . Hallucinations 07/29/2014  . Overdose 08/28/2018   Past Medical History:  Diagnosis Date  . Asthma   . CHF (congestive heart failure) ([place])   . COPD (chronic obstructive pulmonary disease) ([place])   . Knee pain, chronic   . Migraines      Significant Hospital Events   Endotracheally intubated >>>>>02/26/2019  Consults:  PCCM>>>>>>>>02/26/2019  Procedures:  Endotracheally intubated >>>>>>02/26/2019  Significant Diagnostic Tests:  UDS: + opiates and benzodiazepines ABG: 7.354/47/81/26  CT Head w/o contrast:  FINDINGS: Brain: No intracranial hemorrhage, mass effect, or midline shift. No hydrocephalus. The basilar cisterns are patent. No evidence of territorial infarct or acute ischemia. Tiny remote lacunar infarct in right basal ganglia. No extra-axial or intracranial fluid collection. Vascular: No hyperdense vessel. Skull: No fracture or focal lesion. Sinuses/Orbits: Scattered paranasal sinus mucosal thickening without fluid level. Mastoid air cells are clear. Endotracheal and  orogastric tubes partially visualized. The visualized orbits are unremarkable.  CXR FINDINGS: Status post intubation. The endotracheal tube in satisfactory position. Enteric catheter overlies the stomach. Cardiomediastinal  silhouette is normal. Mediastinal contours appear intact. There is no evidence of focal airspace consolidation, pleural effusion or pneumothorax. Osseous structures are without acute abnormality. Soft tissues are grossly normal.  EKG: Sinus rhythm  QTc 423  Micro Data:  SARSCOV2>>>>>>>negative 02/26/2019  Antimicrobials:  none    Objective   Blood pressure (!) 132/96, pulse 85, temperature (!) 96 F (35.6 C), temperature source Temporal, resp. rate 17, height 6' (1.829 m), weight 81.6 kg, SpO2 100 %.    Vent Mode: PRVC FiO2 (%):  [60 %-75 %] 60 % Set Rate:  [16 bmp] 16 bmp Vt Set:  [620 mL] 620 mL PEEP:  [5 cmH20] 5 cmH20 Plateau Pressure:  [15 cmH20] 15 cmH20   Intake/Output Summary (Last 24 hours) at 02/26/2019 2041 Last data filed at 02/26/2019 2008 Gross per 24 hour  Intake 1000 ml  Output -  Net 1000 ml   Filed Weights   02/26/19 1805  Weight: 81.6 kg    Examination: General: thin male sedated intubated HENT: normocephalic atraumatic ETT in oropharynx, some thick white oral secretions noted Lungs: clear to ausculation bilaterally, no appreciable wheezing Cardiovascular: S1 and S2 noted w/o murmur rub or gallop Abdomen: soft scaphoid abdomen non distended non tender hypoactive BS. Permanent marker/ ink writing on belly Extremities: no lower ext edema Neuro: withdraws from painful stimuli, +gag, moves all 4 ext, pupils equal sluggishly reactive pinpoint GU: foley catheter   Assessment & Plan:  1. Intentional Overdose UDS positive for opiates and benzodiazipienes >> pt is a Schizophrenic w/ mood instability with a history of previous attempts He was found with a note stating "DNR" Code status remains Full as this is an intentional suicide attempt ? Polypharmacy >>third party reporting of empty bottles of Norco, and Xanax. Pt was also Rx Zyprexa, and Seroquel.  Received 4.4mg  Narcan in total and responded with change in pupil size and withdrawal of  extremities to noxious stimuli but remained non verbal per EDP documentation>> lack of response to Narcan goes against pure opioid overdose. Pt  received RSI for intubation (etomidate and rocuronium) and was placed on Propofol gtt for sedation, now stopped. Plan: Stop all sedative medications Neurochecks Q 1 Continue on Cardiac monitoring w/ QTc Once extubated will need Psychiatric evaluation and 1:1 sitter   2. Endotracheally intubated for Airway protection in the setting of Acute encephalopathy Has a PMHx COPD per EMR Negative for any acute intrapulmonary process ABG shows resp acidosis w/ compensated metabolic alkalosis PO2 81 on FiO2  Plan: TV 8cc/kg PRVC Bronchodilators Q 6 PRN for wheezing VAPrecautions Once pt is more awake SBT>> if RSBI <105 and pt meets parameters will consider extubation  3. Acute encephalopathy CTHead w/o contrast negative for any acute procees UDS + opiates and benzodiazipienes Plan: Avoid sedative medications until neurological response improves Neurochecks Q 1 If pt doesn't show signs of improvement w/o sedation consider EEG to r/o subclinical seizures and possible neuro consult  4. Hgb 17 Hct 53 >>CBC appears hemo-concentrated Plan: Continue on IVF maintenance while NPO CBC in AM.  Best practice:  Diet: NPO for now. Cont w/ OGT if pt remains intubated will assess TF Pain/Anxiety/Delirium protocol (if indicated): hold all sedative medications in order to assess neurological status VAP protocol (if indicated): yes DVT prophylaxis: Lovenox [place] w/ SCDs GI prophylaxis: PPI Glucose control: ISS if  BG exceeds 180mg /dl Mobility: bedrest Code Status: Full Family Communication: Mother, son and sister listed on Facesheet spoke to son who states that his Dad texted him on 6/18. Pt's sister spoke to him this morning over the phone.  Patient lives alone and family is not aware of behavioral health follow up.   Disposition: ICU  Labs   CBC: Recent Labs   Lab 02/26/19 1815 02/26/19 1951  WBC 5.9  --   HGB 16.9 16.0  HCT 52.0 47.0  MCV 92.0  --   PLT 173  --     Basic Metabolic Panel: Recent Labs  Lab 02/26/19 1815 02/26/19 1951  NA 141 143  K 4.8 3.5  CL 108  --   CO2 25  --   GLUCOSE 111*  --   BUN 6  --   CREATININE 0.87  --   CALCIUM 9.2  --    GFR: Estimated Creatinine Clearance: 112.7 mL/min (by C-G formula based on SCr of 0.87 mg/dL). Recent Labs  Lab 02/26/19 1815  WBC 5.9    Liver Function Tests: Recent Labs  Lab 02/26/19 1815  AST 38  ALT 37  ALKPHOS 53  BILITOT 1.4*  PROT 6.6  ALBUMIN 3.9   No results for input(s): LIPASE, AMYLASE in the last 168 hours. No results for input(s): AMMONIA in the last 168 hours.  ABG    Component Value Date/Time   PHART 7.354 02/26/2019 1951   PCO2ART 47.1 02/26/2019 1951   PO2ART 81.0 (L) 02/26/2019 1951   HCO3 26.6 02/26/2019 1951   TCO2 28 02/26/2019 1951   ACIDBASEDEF 1.0 08/28/2018 0619   O2SAT 96.0 02/26/2019 1951     Coagulation Profile: No results for input(s): INR, PROTIME in the last 168 hours.  Cardiac Enzymes: No results for input(s): CKTOTAL, CKMB, CKMBINDEX, TROPONINI in the last 168 hours.  HbA1C: Hgb A1c MFr Bld  Date/Time Value Ref Range Status  06/25/2017 06:41 AM 5.5 4.8 - 5.6 % Final    Comment:    (NOTE) Pre diabetes:          5.7%-6.4% Diabetes:              >6.4% Glycemic control for   <7.0% adults with diabetes     CBG: Recent Labs  Lab 02/26/19 1758  GLUCAP 90    Review of Systems:   Marland KitchenMarland KitchenReview of Systems  Unable to perform ROS: Intubated     Past Medical History  He,  has a past medical history of Asthma, CHF (congestive heart failure) ([place]), Chronic back pain, COPD (chronic obstructive pulmonary disease) ([place]), Knee pain, chronic, Migraines, and Schizophrenia, schizo-affective ([place]).   Surgical History    Past Surgical History:  Procedure Laterality Date  . KNEE SURGERY     four times  . WISDOM TOOTH  EXTRACTION       Social History   reports that he has been smoking cigarettes. He has a 66.00 pack-year smoking history. He has never used smokeless tobacco. He reports that he does not drink alcohol or use drugs.   Family History   His family history includes Hypertension in his father.   Allergies Allergies  Allergen Reactions  . Amoxicillin Diarrhea    Has patient had a PCN reaction causing immediate rash, facial/tongue/throat swelling, SOB or lightheadedness with hypotension: No Has patient had a PCN reaction causing severe rash involving mucus membranes or skin necrosis: No Has patient had a PCN reaction that required hospitalization: No Has patient had a  PCN reaction occurring within the last 10 years: No If all of the above answers are "NO", then may proceed with Cephalosporin use.   Marland Kitchen Dextromethorphan-Guaifenesin Other (See Comments)    unknown  . Haloperidol Other (See Comments)    unspecified  . Meloxicam Other (See Comments)    unspecified  . Nsaids Other (See Comments)    Rectal bleeding  . Prednisone Other (See Comments)    Psychotic episodes   . Quetiapine Other (See Comments)    Psychosis with high dose (600mg )  . Sudafed [Pseudoephedrine Hcl] Other (See Comments)    Hallucinations  . Ziprasidone Other (See Comments)    unspecified     Home Medications  Prior to Admission medications   Medication Sig Start Date End Date Taking? Authorizing Provider  ALPRAZolam [name]) 1 MG tablet Take 1 mg by mouth 4 (four) times daily as needed for anxiety. 10/03/18  Yes [provider]  HYDROcodone-acetaminophen (NORCO) 10-325 MG tablet Take 1 tablet by mouth 2 (two) times a day.   Yes [provider]  acetaminophen (TYLENOL) 325 MG tablet Take 2 tablets (650 mg total) by mouth every 6 (six) hours as needed for mild pain, fever or headache. Patient not taking: Reported on 11/28/2018 09/06/18   Debbe Odea, MD  diclofenac sodium (VOLTAREN) 1 % GEL Apply 4  g topically 4 (four) times daily as needed. Patient not taking: Reported on 11/28/2018 10/22/18   Hilts, Legrand Como, MD  doxepin (SINEQUAN) 50 MG capsule Take 50 mg by mouth at bedtime. 06/22/18   [provider]  mupirocin ointment (BACTROBAN) 2 % Place 1 application into the nose 2 (two) times daily. Patient not taking: Reported on 11/28/2018 10/22/18   Hilts, Legrand Como, MD  OLANZapine (ZYPREXA) 10 MG tablet Take 20 mg by mouth at bedtime.  07/30/18   [provider]  Oxycodone HCl 10 MG TABS Take 10 mg by mouth every 6 (six) hours as needed (pain).  10/01/18   [provider]  perphenazine (TRILAFON) 4 MG tablet Take 1 tablet (4 mg total) by mouth 2 (two) times daily. Patient not taking: Reported on 02/26/2019 09/06/18   Debbe Odea, MD  QUEtiapine (SEROQUEL) 100 MG tablet Take 100 mg by mouth 2 (two) times daily.    [provider]  QUEtiapine (SEROQUEL) 25 MG tablet Take 25 mg by mouth every morning.    [provider]      I, Dr Seward Carol have personally reviewed patient's available data, including medical history, events of note, physical examination and test results as part of my evaluation. I have discussed with NP and other care providers such as pharmacist, RN and Elink.  In addition,  I personally evaluated patient  The patient is critically ill with multiple organ systems failure and requires high complexity decision making for assessment and support, frequent evaluation and titration of therapies, application of advanced monitoring technologies and extensive interpretation of multiple databases.   Critical Care Time devoted to patient care services described in this note is 48 Minutes. This time reflects time of care of this signee Dr Seward Carol. This critical care time does not reflect procedure time, or teaching time or supervisory time of NP but could involve care discussion time    Dr. Seward Carol Pulmonary Critical Care  Medicine  02/26/2019 9:03 PM   Critical care time: 48 mins

## 2019-02-26 NOTE — ED Provider Notes (Signed)
[facility] EMERGENCY DEPARTMENT Provider Note   CSN: [id number] Arrival date & time: 02/26/19  1752     History   Chief Complaint Chief Complaint  Patient presents with  . Drug Overdose    HPI [name] is a 48 y.o. male.     The history is provided by the patient, the EMS personnel, medical records, a relative and a parent.  Drug Overdose This is a new problem. The current episode started less than 1 hour ago. The problem occurs constantly. The problem has not changed since onset.Associated symptoms comments: Unable to specify, patient obtunded. Nothing aggravates the symptoms. Nothing relieves the symptoms. He has tried rest (Narcan intranasal) for the symptoms. The treatment provided no relief.    Past Medical History:  Diagnosis Date  . Asthma   . CHF (congestive heart failure) ([place])   . Chronic back pain   . COPD (chronic obstructive pulmonary disease) ([place])   . Knee pain, chronic   . Migraines   . Schizophrenia, schizo-affective [facility])     Patient Active Problem List   Diagnosis Date Noted  . Laceration of Achilles tendon, right, initial encounter   . Shock ([place]) 08/28/2018  . Suicide attempt ([place])   . Benzodiazepine (tranquilizer) overdose, intentional self-harm, initial encounter ([place]) 06/13/2018  . Overdose of benzodiazepine, intentional self-harm, initial encounter ([place])   . Somnolence   . Schizophrenia, paranoid type ([place]) 08/19/2017  . Hypokalemia 04/28/2014  . Drug overdose 04/27/2014  . Schizoaffective disorder, bipolar type ([place]) 03/02/2014  . Anxiety state 03/02/2014  . Benzodiazepine dependence ([place]) 03/02/2014  . Chest pain 10/11/2013  . EKG abnormalities 10/11/2013  . Chronic back pain     Past Surgical History:  Procedure Laterality Date  . KNEE SURGERY     four times  . WISDOM TOOTH EXTRACTION          Home Medications    Prior to Admission medications   Medication Sig Start Date End Date Taking? Authorizing  Provider  acetaminophen (TYLENOL) 325 MG tablet Take 2 tablets (650 mg total) by mouth every 6 (six) hours as needed for mild pain, fever or headache. Patient not taking: Reported on 11/28/2018 09/06/18   Debbe Odea, MD  ALPRAZolam [name]) 1 MG tablet Take 1 mg by mouth 4 (four) times daily as needed for anxiety. 10/03/18   [provider]  diclofenac sodium (VOLTAREN) 1 % GEL Apply 4 g topically 4 (four) times daily as needed. Patient not taking: Reported on 11/28/2018 10/22/18   Hilts, Legrand Como, MD  doxepin (SINEQUAN) 50 MG capsule Take 50 mg by mouth at bedtime. 06/22/18   [provider]  mupirocin ointment (BACTROBAN) 2 % Place 1 application into the nose 2 (two) times daily. Patient not taking: Reported on 11/28/2018 10/22/18   Hilts, Legrand Como, MD  OLANZapine (ZYPREXA) 10 MG tablet Take 20 mg by mouth at bedtime.  07/30/18   [provider]  Oxycodone HCl 10 MG TABS Take 10 mg by mouth every 6 (six) hours as needed (pain).  10/01/18   [provider]  perphenazine (TRILAFON) 4 MG tablet Take 1 tablet (4 mg total) by mouth 2 (two) times daily. 09/06/18   Debbe Odea, MD    Family History Family History  Problem Relation Age of Onset  . Hypertension Father     Social History Social History   Tobacco Use  . Smoking status: Current Every Day Smoker    Packs/day: 2.00    Years: 33.00  Pack years: 66.00    Types: Cigarettes  . Smokeless tobacco: Never Used  Substance Use Topics  . Alcohol use: No    Comment: occ  . Drug use: No    Comment: Pt denied     Allergies   Amoxicillin, Dextromethorphan-guaifenesin, Haloperidol, Meloxicam, Nsaids, Prednisone, Quetiapine, Sudafed [pseudoephedrine hcl], and Ziprasidone   Review of Systems Review of Systems  All other systems reviewed and are negative.    Physical Exam Updated Vital Signs BP (!) 147/105   Pulse 86   Temp (!) 96 F (35.6 C) (Temporal)   Resp 18   Ht 6' (1.829 m)   Wt 81.6  kg   SpO2 100%   BMI 24.41 kg/m   Physical Exam Vitals signs and nursing note reviewed.  Constitutional:      General: He is in acute distress.     Appearance: He is well-developed. He is ill-appearing and toxic-appearing.  HENT:     Head: Normocephalic and atraumatic.     Comments: No obvious palpable deformity skull concerning for skull fracture  Nasal trumpet in place  Nonrebreather mask in place    Mouth/Throat:     Mouth: Mucous membranes are dry.  Eyes:     Conjunctiva/sclera: Conjunctivae normal.     Comments: Pupils 1 mm, bilaterally  Neck:     Musculoskeletal: Neck supple. No neck rigidity.  Cardiovascular:     Rate and Rhythm: Normal rate.  Pulmonary:     Breath sounds: No stridor.     Comments: Respiratory rate 8 breaths/min, rhonchorous breath sounds throughout Abdominal:     General: There is no distension.     Palpations: Abdomen is soft.  Musculoskeletal:     Right lower leg: No edema.     Left lower leg: No edema.  Skin:    General: Skin is warm and dry.     Findings: No erythema or rash.     Comments: No obvious flushing or skin changes appreciated  No obvious clonus bilateral lower extremities  Neurological:     Comments: Patient obtunded, GCS 1, 1, 4      ED Treatments / Results  Labs (all labs ordered are listed, but only abnormal results are displayed) Labs Reviewed  SARS CORONAVIRUS 2 (HOSPITAL ORDER, [facility])  CBC  COMPREHENSIVE METABOLIC PANEL  ETHANOL  SALICYLATE LEVEL  ACETAMINOPHEN LEVEL  RAPID URINE DRUG SCREEN, HOSP PERFORMED  URINALYSIS, ROUTINE W REFLEX MICROSCOPIC  BLOOD GAS, ARTERIAL  CBG MONITORING, ED  CBG MONITORING, ED    EKG    Radiology No results found.  Procedures Procedure Name: Intubation Date/Time: 02/26/2019 6:24 PM Performed by: Lonzo Candy, MD Pre-anesthesia Checklist: Patient identified, Emergency Drugs available, Suction available, Patient being monitored and  Timeout performed Oxygen Delivery Method: Non-rebreather mask Preoxygenation: Pre-oxygenation with 100% oxygen Induction Type: Rapid sequence Laryngoscope Size: Mac and 3 Grade View: Grade I Tube size: 8.0 mm Number of attempts: 1 Airway Equipment and Method: Stylet Placement Confirmation: ETT inserted through vocal cords under direct vision and Breath sounds checked- equal and bilateral Secured at: 23 cm Tube secured with: ETT holder      (including critical care time)  Medications Ordered in ED Medications  propofol (DIPRIVAN) 1000 MG/100ML infusion (has no administration in time range)  naloxone [facility]) injection 0.4 mg (0.4 mg Intravenous Given 02/26/19 1755)  naloxone [facility]) injection 2 mg (2 mg Intravenous Given 02/26/19 1757)  naloxone (NARCAN) injection 2 mg (2 mg Intravenous Given 02/26/19  1800)  etomidate (AMIDATE) injection (10 mg Intravenous Given 02/26/19 1810)  rocuronium (ZEMURON) injection (100 mg Intravenous Given 02/26/19 1810)  0.9 %  sodium chloride infusion (1,000 mLs Intravenous New Bag/Given 02/26/19 1811)     Initial Impression / Assessment and Plan / ED Course  I have reviewed the triage vital signs and the nursing notes.  Pertinent labs & imaging results that were available during my care of the patient were reviewed by me and considered in my medical decision making (see chart for details).        Medical Decision Making: [name] is a 48 y.o. male who presented to the ED today with status post overdose.  Past medical history significant for migraines, schizophrenia, COPD Reviewed and confirmed nursing documentation for past medical history, family history, social history.  On my initial exam, the pt was toxic appearing, in acute distress, GCS 1, 1, 4, nonrebreather in place, respiratory rate less than 10, not tachycardic, pinpoint pupils, not hypotensive. Patient presents status post overdose, found by son, son could not appreciate pulses so  started CPR, pulses present when EMS arrived, agonal respirations 0.5 intranasal Narcan administered, patient started breathing spontaneously without any other response, pinpoint pupils Patient was prescribed Norco 10 mg and Xanax 1 mg, also prescribed Zyprexa, Seroquel, will have pharmacy investigate to see when prescriptions were last filled  0.4 IV Narcan administered.  No response 2 IV Narcan administered, pupils improved to threes bilaterally, reactive but no improvement in mental status or GCS 2 IV Narcan administered, minimal response patient withdraws extremities to pain  Patient still has no verbal or ocular response, will intubate patient for airway protection, CNS failure Patient last had more than 100 Xanax tablets prescribed, dispensed a few weeks ago, had around 59 Norco's filled a few weeks ago, all pill bottles empty per EMS and family Attempting to actually obtain pill bottles from family  UDS positive for opioids and benzos Chest x-ray shows ET tube in appropriate position pH 7.35 status post intubation Tylenol and salicylate levels negative, given reported time of ingestion will redraw Tylenol level at 4 hours Sodium 141, potassium 4.8, CO2 25, creatinine 0.7, white blood cells 5.9, hemoglobin 16.9 All radiology and laboratory studies reviewed independently and with my attending physician, agree with reading provided by radiologist unless otherwise noted.  Upon reassessing patient, patient was intubated, sedated, mechanically ventilated, will admit patient to ICU for further evaluation and care Based on the above findings, I believe patient requires admission. Patient admitted. The above care was discussed with and agreed upon by my attending physician. Emergency Department Medication Summary:  Medications  propofol (DIPRIVAN) 1000 MG/100ML infusion (has no administration in time range)  naloxone [facility]) injection 0.4 mg (0.4 mg Intravenous Given 02/26/19 1755)  naloxone  [facility]) injection 2 mg (2 mg Intravenous Given 02/26/19 1757)  naloxone [facility]) injection 2 mg (2 mg Intravenous Given 02/26/19 1800)  etomidate (AMIDATE) injection (10 mg Intravenous Given 02/26/19 1810)  rocuronium (ZEMURON) injection (100 mg Intravenous Given 02/26/19 1810)  0.9 %  sodium chloride infusion (1,000 mLs Intravenous New Bag/Given 02/26/19 1811)   Final Clinical Impressions(s) / ED Diagnoses   Final diagnoses:  None    ED Discharge Orders    None       Lonzo Candy, MD 02/26/19 2014    Lennice Sites, DO 02/26/19 2351

## 2019-02-26 NOTE — ED Notes (Signed)
Pt yawned and began pulling arm back to resistance after most recent narcan injection.

## 2019-02-26 NOTE — ED Triage Notes (Addendum)
Pt found by son laying in the floor with empty hydrocodone pill bottle close by and a lot of other pill bottles. Unknown amount. PD on scene first, pt was apneic and they could not palpate a pulse, they started CPR. EMS arrived on scene and pt had Shallow RR's and weak pulses. Left a note written on the skin of his chest that said "please DNR" EMS was called out for suicidal ideation x 1 week ago. Placed on NRB. GCS 3 throughout but after being given 0.5mg  narcan intranasal RR's improved. Pt's son states pt has hx of this. Son had been trying to get a hold of pt since 1615 and then went to the house at 1715 and found pt like this.

## 2019-02-26 NOTE — ED Notes (Signed)
Report gven to rn on 2h

## 2019-02-26 NOTE — ED Notes (Signed)
Pt is not responding excep;t to painful stimuli

## 2019-02-27 ENCOUNTER — Other Ambulatory Visit: Payer: Self-pay

## 2019-02-27 DIAGNOSIS — T50902D Poisoning by unspecified drugs, medicaments and biological substances, intentional self-harm, subsequent encounter: Secondary | ICD-10-CM

## 2019-02-27 DIAGNOSIS — J9601 Acute respiratory failure with hypoxia: Secondary | ICD-10-CM

## 2019-02-27 LAB — BASIC METABOLIC PANEL
Anion gap: 8 (ref 5–15)
BUN: 5 mg/dL — ABNORMAL LOW (ref 6–20)
CO2: 29 mmol/L (ref 22–32)
Calcium: 9.4 mg/dL (ref 8.9–10.3)
Chloride: 108 mmol/L (ref 98–111)
Creatinine, Ser: 0.83 mg/dL (ref 0.61–1.24)
GFR calc Af Amer: >60 mL/min (ref >60)
GFR calc non Af Amer: >60 mL/min (ref >60)
Glucose, Bld: 90 mg/dL (ref 70–99)
Potassium: 4.4 mmol/L (ref 3.5–5.1)
Sodium: 145 mmol/L (ref 135–145)

## 2019-02-27 LAB — GLUCOSE, CAPILLARY
Glucose-Capillary: 103 mg/dL — ABNORMAL HIGH (ref 70–99)
Glucose-Capillary: 113 mg/dL — ABNORMAL HIGH (ref 70–99)
Glucose-Capillary: 128 mg/dL — ABNORMAL HIGH (ref 70–99)

## 2019-02-27 LAB — CBC
HCT: 51.8 % (ref 39.0–52.0)
Hemoglobin: 16.7 g/dL (ref 13.0–17.0)
MCH: 29.2 pg (ref 26.0–34.0)
MCHC: 32.2 g/dL (ref 30.0–36.0)
MCV: 90.6 fL (ref 80.0–100.0)
Platelets: 146 10*3/uL — ABNORMAL LOW (ref 150–400)
RBC: 5.72 MIL/uL (ref 4.22–5.81)
RDW: 13.1 % (ref 11.5–15.5)
WBC: 8.6 10*3/uL (ref 4.0–10.5)
nRBC: 0 % (ref 0.0–0.2)

## 2019-02-27 LAB — PHOSPHORUS: Phosphorus: 2.6 mg/dL (ref 2.5–4.6)

## 2019-02-27 LAB — MAGNESIUM: Magnesium: 2.2 mg/dL (ref 1.7–2.4)

## 2019-02-27 LAB — MRSA PCR SCREENING: MRSA by PCR: POSITIVE — AB

## 2019-02-27 MED ORDER — MUPIROCIN 2 % EX OINT
1.0000 "application " | TOPICAL_OINTMENT | Freq: Two times a day (BID) | CUTANEOUS | Status: AC
  Administered 2019-02-27 – 2019-03-03 (×6): 1 via NASAL
  Filled 2019-02-27 (×3): qty 22

## 2019-02-27 MED ORDER — CHLORHEXIDINE GLUCONATE 0.12% ORAL RINSE (MEDLINE KIT)
15.0000 mL | Freq: Two times a day (BID) | OROMUCOSAL | Status: DC
  Administered 2019-02-27 – 2019-02-28 (×5): 15 mL via OROMUCOSAL

## 2019-02-27 MED ORDER — CHLORHEXIDINE GLUCONATE CLOTH 2 % EX PADS
6.0000 | MEDICATED_PAD | Freq: Every day | CUTANEOUS | Status: AC
  Administered 2019-02-28: 6 via TOPICAL

## 2019-02-27 MED ORDER — CHLORHEXIDINE GLUCONATE CLOTH 2 % EX PADS
6.0000 | MEDICATED_PAD | Freq: Every day | CUTANEOUS | Status: DC
  Administered 2019-02-27: 6 via TOPICAL

## 2019-02-27 MED ORDER — VANCOMYCIN HCL 10 G IV SOLR
1500.0000 mg | Freq: Two times a day (BID) | INTRAVENOUS | Status: DC
  Administered 2019-02-28: 1500 mg via INTRAVENOUS
  Filled 2019-02-27 (×2): qty 1500

## 2019-02-27 MED ORDER — VANCOMYCIN HCL 10 G IV SOLR
1750.0000 mg | Freq: Once | INTRAVENOUS | Status: AC
  Administered 2019-02-27: 1750 mg via INTRAVENOUS
  Filled 2019-02-27: qty 1750

## 2019-02-27 MED ORDER — PIPERACILLIN-TAZOBACTAM 3.375 G IVPB 30 MIN: 3.3750 g | Freq: Three times a day (TID) | INTRAVENOUS | Status: DC

## 2019-02-27 MED ORDER — PRO-STAT SUGAR FREE PO LIQD
30.0000 mL | Freq: Two times a day (BID) | ORAL | Status: DC
  Administered 2019-02-27 – 2019-02-28 (×2): 30 mL
  Filled 2019-02-27 (×2): qty 30

## 2019-02-27 MED ORDER — PIPERACILLIN-TAZOBACTAM 3.375 G IVPB
3.3750 g | Freq: Three times a day (TID) | INTRAVENOUS | Status: DC
  Administered 2019-02-28 (×2): 3.375 g via INTRAVENOUS
  Filled 2019-02-27 (×2): qty 50

## 2019-02-27 MED ORDER — ORAL CARE MOUTH RINSE
15.0000 mL | OROMUCOSAL | Status: DC
  Administered 2019-02-27 – 2019-02-28 (×15): 15 mL via OROMUCOSAL

## 2019-02-27 MED ORDER — VITAL HIGH PROTEIN PO LIQD
1000.0000 mL | ORAL | Status: DC
  Administered 2019-02-27: 1000 mL

## 2019-02-27 MED ORDER — DEXMEDETOMIDINE HCL IN NACL 200 MCG/50ML IV SOLN
0.4000 ug/kg/h | INTRAVENOUS | Status: DC
  Administered 2019-02-27 (×2): 1 ug/kg/h via INTRAVENOUS
  Filled 2019-02-27 (×3): qty 50

## 2019-02-27 NOTE — Progress Notes (Signed)
Pharmacy Antibiotic Note  [name] is a 48 y.o. male admitted on 02/26/2019 with sepsis.  Pharmacy has been consulted for vancomycin dosing.  Presenting after attempted overdose. WBC 8.6, Scr 0.83 (CrCl >100 mL/min), temp 102.5. CXR done with no acute infiltrates.   Plan: Zosyn 3.375g IV q8h (4 hour infusion). Vancomycin 1750 mg IV then 1500 mg IV every 12 hours  Monitor renal fx, cx results, clinical pic, and vanc levels as appropriate  Height: 6' (182.9 cm) Weight: 188 lb 0.8 oz (85.3 kg) IBW/kg (Calculated) : 77.6  Temp (24hrs), Avg:99.5 F (37.5 C), Min:98.4 F (36.9 C), Max:102.5 F (39.2 C)  Recent Labs  Lab 02/26/19 1815 02/27/19 0315  WBC 5.9 8.6  CREATININE 0.87 0.83    Estimated Creatinine Clearance: 118.2 mL/min (by C-G formula based on SCr of 0.83 mg/dL).    Allergies  Allergen Reactions  . Amoxicillin Diarrhea    Has patient had a PCN reaction causing immediate rash, facial/tongue/throat swelling, SOB or lightheadedness with hypotension: No Has patient had a PCN reaction causing severe rash involving mucus membranes or skin necrosis: No Has patient had a PCN reaction that required hospitalization: No Has patient had a PCN reaction occurring within the last 10 years: No If all of the above answers are "NO", then may proceed with Cephalosporin use.   Marland Kitchen Dextromethorphan-Guaifenesin Other (See Comments)    unknown  . Haloperidol Other (See Comments)    unspecified  . Meloxicam Other (See Comments)    unspecified  . Nsaids Other (See Comments)    Rectal bleeding  . Prednisone Other (See Comments)    Psychotic episodes   . Quetiapine Other (See Comments)    Psychosis with high dose (600mg )  . Sudafed [Pseudoephedrine Hcl] Other (See Comments)    Hallucinations  . Ziprasidone Other (See Comments)    unspecified    Antimicrobials this admission: Vanc 6/21 >>  Zosyn 6/21 >>   Dose adjustments this admission: N/A  Microbiology results: 6/21 BCx:  sent 6/20 MRSA PCR: pos 6/20 COVID: neg  Thank you for allowing pharmacy to be a part of this patient's care.  Antonietta Jewel, PharmD, [place] Clinical Pharmacist  Pager: [phone] Phone: [phone] 02/27/2019 8:51 PM

## 2019-02-27 NOTE — Progress Notes (Addendum)
[facility] Progress Note Patient Name: [name] DOB: 12-14-1970 MRN: [id number]   Date of Service  02/27/2019  HPI/Events of Note  Notified of fever Tmax 102.65F. CXR done on admission with no acute infiltrates. UA clear.  Pt is also more alert per RN.  eICU Interventions  Obtain blood cultures.  Give empiric antibiotics for possible aspiration or bacteremia give hx of drug use.  Vanc and Zosyn ordered.  Pt is MRSA positive. Start on precedex if needed.      Intervention Category Intermediate Interventions: Other:  Elsie Lincoln 02/27/2019, 8:46 PM

## 2019-02-27 NOTE — Plan of Care (Signed)
Unable to met the following due to patient unable to respond at this time.  Patient is intubated, family not available to assist.  Problem: Education: Goal: Knowledge of General Education information will improve Description: Including pain rating scale, medication(s)/side effects and non-pharmacologic comfort measures Outcome: Not Met (add Reason)   Problem: Health Behavior/Discharge Planning: Goal: Ability to manage health-related needs will improve Outcome: Not Met (add Reason)   Problem: Activity: Goal: Risk for activity intolerance will decrease Outcome: Not Met (add Reason)   Problem: Nutrition: Goal: Adequate nutrition will be maintained Outcome: Not Met (add Reason)   Problem: Coping: Goal: Level of anxiety will decrease Outcome: Not Met (add Reason)   Problem: Coping: Goal: Ability to disclose and discuss thoughts of suicide and self-harm will improve Outcome: Not Met (add Reason)   Problem: Self Esteem: Goal: Ability to verbalize positive feeling about self will improve Outcome: Not Met (add Reason)

## 2019-02-27 NOTE — Progress Notes (Signed)
..   NAME:  [name], MRN:  [id number], DOB:  06-28-1971, LOS: 1 ADMISSION DATE:  02/26/2019, CONSULTATION DATE:  02/26/2019 REFERRING MD:  Ronnald Nian MD, CHIEF COMPLAINT:  Altered mental status   Brief History   48 yo M w/ PMHx COPD, Asthma, Schizoaffective w/ past suicide attempts the most recent documented in EMR was n March 2020. Per EMS they were called to this patient's home one week ago for suicidal ideation.   Per Alta Corning note: Pt's son found him lying on the floor at 1715 with an empty bottle of hydrocodone and other pill bottles in the vicinity. Last known normal was before 1615. The ingestion was not witnessed. Family thought pt was initially in cardiac arrest and started CPR but on arrival of EMD pt was found to have shallow breathing and a weak pulse.  There was a note written on the skin of the patient's chest that said "please DNR." EMS administered intranasal narcan which improved pt's resp rate.   Per EDP note: Patient was prescribed Norco 10 mg and Xanax 1 mg, also prescribed Zyprexa, and Seroquel. It can not be confirmed at this time how much or what pt took exactly.  Third party report: "Patient last had more than 100 Xanax tablets prescribed, dispensed a few weeks ago, had around 75 Norco's filled a few weeks ago, all pill bottles empty per EMS and family. Attempting to actually obtain pill bottles from family"   Significant Hospital Events   Endotracheally intubated >>>>>02/26/2019  Consults:  PCCM>>>>>>>>02/26/2019  Procedures:  Endotracheally intubated >>>>>>02/26/2019  Significant Diagnostic Tests:  UDS: + opiates and benzodiazepines ABG: 7.354/47/81/26  CT Head w/o contrast:  FINDINGS: Brain: No intracranial hemorrhage, mass effect, or midline shift. No hydrocephalus. The basilar cisterns are patent. No evidence of territorial infarct or acute ischemia. Tiny remote lacunar infarct in right basal ganglia. No extra-axial or intracranial fluid collection. Vascular:  No hyperdense vessel. Skull: No fracture or focal lesion. Sinuses/Orbits: Scattered paranasal sinus mucosal thickening without fluid level. Mastoid air cells are clear. Endotracheal and orogastric tubes partially visualized. The visualized orbits are unremarkable.  CXR FINDINGS: Status post intubation. The endotracheal tube in satisfactory position. Enteric catheter overlies the stomach. Cardiomediastinal silhouette is normal. Mediastinal contours appear intact. There is no evidence of focal airspace consolidation, pleural effusion or pneumothorax. Osseous structures are without acute abnormality. Soft tissues are grossly normal.  EKG: Sinus rhythm  QTc 423  Micro Data:  SARSCOV2>>>>>>>negative 02/26/2019  Antimicrobials:  none     SUBJECTIVE/OVERNIGHT/INTERVAL HX    02/27/2019  - remains comatose on vent. Not on sedation. Not on pressors. LR gtt +   Objective   Blood pressure (!) 146/105, pulse 87, temperature 98.8 F (37.1 C), temperature source Oral, resp. rate 16, height 6' (1.829 m), weight 85.3 kg, SpO2 100 %.    Vent Mode: PRVC FiO2 (%):  [40 %-75 %] 40 % Set Rate:  [16 bmp] 16 bmp Vt Set:  [620 mL] 620 mL PEEP:  [5 cmH20] 5 cmH20 Plateau Pressure:  [10 cmH20-38 cmH20] 15 cmH20   Intake/Output Summary (Last 24 hours) at 02/27/2019 0848 Last data filed at 02/27/2019 0800 Gross per 24 hour  Intake 1668.15 ml  Output 1205 ml  Net 463.15 ml   Filed Weights   02/26/19 1805 02/26/19 2345 02/27/19 0500  Weight: 81.6 kg 85.3 kg 85.3 kg   General Appearance:  Looks criticall ill  Head:  Normocephalic, without obvious abnormality, atraumatic Eyes:  PERRL - pin point. Left  sluggish, conjunctiva/corneas - clear     Ears:  Normal external ear canals, both ears Nose:  G tube - no Throat:  ETT TUBE - yes , OG tube - yes Neck:  Supple,  No enlargement/tenderness/nodules Lungs: Clear to auscultation bilaterally, Ventilator   Synchrony - yes, 40% Heart:  S1 and S2  normal, no murmur, CVP - no.  Pressors - no Abdomen:  Soft, no masses, no organomegaly Genitalia / Rectal:  Not done Extremities:  Extremities- intact Skin:  ntact in exposed areas . Sacral area - not examined Neurologic:  Sedation - none -> RASS - -4      LABS    PULMONARY Recent Labs  Lab 02/26/19 1951  PHART 7.354  PCO2ART 47.1  PO2ART 81.0*  HCO3 26.6  TCO2 28  O2SAT 96.0    CBC Recent Labs  Lab 02/26/19 1815 02/26/19 1951 02/27/19 0315  HGB 16.9 16.0 16.7  HCT 52.0 47.0 51.8  WBC 5.9  --  8.6  PLT 173  --  146*    COAGULATION No results for input(s): INR in the last 168 hours.  CARDIAC  No results for input(s): TROPONINI in the last 168 hours. No results for input(s): PROBNP in the last 168 hours.   CHEMISTRY Recent Labs  Lab 02/26/19 1815 02/26/19 1951 02/27/19 0315  NA 141 143 145  K 4.8 3.5 4.4  CL 108  --  108  CO2 25  --  29  GLUCOSE 111*  --  90  BUN 6  --  5*  CREATININE 0.87  --  0.83  CALCIUM 9.2  --  9.4  MG  --   --  2.2  PHOS  --   --  2.6   Estimated Creatinine Clearance: 118.2 mL/min (by C-G formula based on SCr of 0.83 mg/dL).   LIVER Recent Labs  Lab 02/26/19 1815  AST 38  ALT 37  ALKPHOS 53  BILITOT 1.4*  PROT 6.6  ALBUMIN 3.9     INFECTIOUS No results for input(s): LATICACIDVEN, PROCALCITON in the last 168 hours.   ENDOCRINE CBG (last 3)  Recent Labs    02/26/19 1758  GLUCAP 90         IMAGING x48h  - image(s) personally visualized  -   highlighted in bold Ct Head Wo Contrast  Result Date: 02/26/2019 CLINICAL DATA:  Altered level of consciousness (LOC), unexplained EXAM: CT HEAD WITHOUT CONTRAST TECHNIQUE: Contiguous axial images were obtained from the base of the skull through the vertex without intravenous contrast. COMPARISON:  Head CT 08/28/2018 FINDINGS: Brain: No intracranial hemorrhage, mass effect, or midline shift. No hydrocephalus. The basilar cisterns are patent. No evidence of  territorial infarct or acute ischemia. Tiny remote lacunar infarct in right basal ganglia. No extra-axial or intracranial fluid collection. Vascular: No hyperdense vessel. Skull: No fracture or focal lesion. Sinuses/Orbits: Scattered paranasal sinus mucosal thickening without fluid level. Mastoid air cells are clear. Endotracheal and orogastric tubes partially visualized. The visualized orbits are unremarkable. Other: None. IMPRESSION: No acute intracranial abnormality. Electronically Signed   By: Keith Rake M.D.   On: 02/26/2019 19:23   Dg Chest Portable 1 View  Result Date: 02/26/2019 CLINICAL DATA:  Status post intubation. EXAM: PORTABLE CHEST 1 VIEW COMPARISON:  08/31/2018 FINDINGS: Status post intubation. The endotracheal tube in satisfactory position. Enteric catheter overlies the stomach. Cardiomediastinal silhouette is normal. Mediastinal contours appear intact. There is no evidence of focal airspace consolidation, pleural effusion or pneumothorax. Osseous structures are without  acute abnormality. Soft tissues are grossly normal. IMPRESSION: No active disease. Electronically Signed   By: Fidela Salisbury M.D.   On: 02/26/2019 19:02     Assessment & Plan:  ASSESSMENT / PLAN:  RESPIRATORY A:  Acute respiratory failure due to intentional OD on 02/26/2019 - neeiding vent  02/27/2019 -> remain on vent. Does not meet SBT/extubation criteria  P:   PRVC HOb > 30 degres    PSYCHIATRY A:   Baseline: pt is a Schizophrenic w/ mood instability with a history of previous attempts -  Pt was also Rx Zyprexa, and Seroquel.   Admit 02/26/2019 - interntional OD with note "DNR" on him. U Tox positive for benzo and opioids. >third party reporting of empty bottles of Norco, and Xanax.  P:   1:1 sitter Pysch consult when aware   NEUROLOGIC A:   Comatose following OD P:   Fluids Supportive care   VASCULAR A:   Normal bp/hr  P:  Map goal > 65  CARDIAC STRUCTURAL A: Last Echo  2105 - result not avail  P: Check trop  CARDIAC ELECTRICAL A: Admit EKG with sinus, normal QRS and QTc  P: Check EKG 02/28/2019  INFECTIOUS A:   No evidence of inection P:   monitor  RENAL A:  At risk AKI but normal P:  fluids  ELECTROLYTES A:  At risk electrolyte imbalance P: monito4   GASTROINTESTINAL A:   NPO  P:   ppi  Start TF  HEMATOLOGIC A:  At risk anemia of ICU   P:  - PRBC for hgb </= 6.9gm%    - exceptions are   -  if ACS susepcted/confirmed then transfuse for hgb </= 8.0gm%,  or    -  active bleeding with hemodynamic instability, then transfuse regardless of hemoglobin value   At at all times try to transfuse 1 unit prbc as possible with exception of active hemorrhage     ENDOCRINE A:   At risk hypo and hyperglycemia   P:   ssi  MSK/DERM x     Best practice:  Diet: NPO for now. Start TF Pain/Anxiety/Delirium protocol (if indicated): hold all sedative medications in order to assess neurological status VAP protocol (if indicated): yes DVT prophylaxis: Lovenox [place] w/ SCDs GI prophylaxis: PPI Glucose control: ISS if BG exceeds 180mg /dl Mobility: bedrest Code Status: Full Family Communication: 6/20 - Mother, son and sister listed on Facesheet spoke to son who states that his Dad texted him on 6/18. Pt's sister spoke to him this morning over the phone.  Patient lives alone and family is not aware of behavioral health follow up.     Disposition: ICU      ATTESTATION & SIGNATURE   The patient [name] Puello is critically ill with multiple organ systems failure and requires high complexity decision making for assessment and support, frequent evaluation and titration of therapies, application of advanced monitoring technologies and extensive interpretation of multiple databases.   Critical Care Time devoted to patient care services described in this note is  30  Minutes. This time reflects time of care of this signee Dr Brand Males. This critical care time does not reflect procedure time, or teaching time or supervisory time of PA/NP/Med student/Med Resident etc but could involve care discussion time     Dr. Brand Males, M.D., [facility].C.P Pulmonary and Critical Care Medicine Staff Physician [place] Pager: [phone], If no answer or between  15:00h -  7:00h: call [phone]  [phone]  02/27/2019 8:49 AM   .

## 2019-02-27 NOTE — Progress Notes (Signed)
Brief Nutrition Note RD working remotely.  Consult received for enteral/tube feeding initiation and management.  Enteral Access: OGT placed 6/20; 54 cm at corner of mouth; terminates in stomach per chest x-ray 6/20  Adult Enteral Nutrition Protocol initiated. Full assessment to follow.  Admitting Dx: Polysubstance abuse ([place]) [F19.10] Acute respiratory failure with hypoxia ([place]) [J96.01] Intentional drug overdose, initial encounter ([place]) [T50.902A]  Body mass index is 25.5 kg/m. Pt meets criteria for overweight based on current BMI.  Labs:  Recent Labs  Lab 02/26/19 1815 02/26/19 1951 02/27/19 0315  NA 141 143 145  K 4.8 3.5 4.4  CL 108  --  108  CO2 25  --  29  BUN 6  --  5*  CREATININE 0.87  --  0.83  CALCIUM 9.2  --  9.4  MG  --   --  2.2  PHOS  --   --  2.6  GLUCOSE 111*  --  90   Willey Blade, MS, RD, LDN Pager: [phone] After Hours/Weekend Pager: [phone]

## 2019-02-27 NOTE — Plan of Care (Signed)
  Problem: Education: Goal: Knowledge of General Education information will improve Description: Including pain rating scale, medication(s)/side effects and non-pharmacologic comfort measures Outcome: Not Progressing   Problem: Health Behavior/Discharge Planning: Goal: Ability to manage health-related needs will improve Outcome: Not Progressing   Problem: Clinical Measurements: Goal: Ability to maintain clinical measurements within normal limits will improve Outcome: Not Progressing Goal: Will remain free from infection Outcome: Not Progressing Goal: Diagnostic test results will improve Outcome: Not Progressing Goal: Respiratory complications will improve Outcome: Not Progressing Goal: Cardiovascular complication will be avoided Outcome: Not Progressing   Problem: Activity: Goal: Risk for activity intolerance will decrease Outcome: Not Progressing   Problem: Nutrition: Goal: Adequate nutrition will be maintained Outcome: Not Progressing   Problem: Coping: Goal: Level of anxiety will decrease Outcome: Not Progressing   Problem: Elimination: Goal: Will not experience complications related to bowel motility Outcome: Not Progressing Goal: Will not experience complications related to urinary retention Outcome: Not Progressing   Problem: Pain Managment: Goal: General experience of comfort will improve Outcome: Not Progressing   Problem: Safety: Goal: Ability to remain free from injury will improve Outcome: Not Progressing   Problem: Skin Integrity: Goal: Risk for impaired skin integrity will decrease Outcome: Not Progressing   Problem: Clinical Measurements: Goal: Remain free from any harm during hospitalization Outcome: Not Progressing   Problem: Nutrition: Goal: Adequate fluids and nutrition will be maintained Outcome: Not Progressing   Problem: Coping: Goal: Ability to disclose and discuss thoughts of suicide and self-harm will improve Outcome: Not  Progressing   Problem: Medication Management: Goal: Adhere to prescribed medication regimen Outcome: Not Progressing   Problem: Sleep Hygiene: Goal: Ability to obtain adequate restful sleep will improve Outcome: Not Progressing   Problem: Self Esteem: Goal: Ability to verbalize positive feeling about self will improve Outcome: Not Progressing

## 2019-02-28 ENCOUNTER — Inpatient Hospital Stay (HOSPITAL_COMMUNITY): Payer: Medicare Other

## 2019-02-28 DIAGNOSIS — T50902A Poisoning by unspecified drugs, medicaments and biological substances, intentional self-harm, initial encounter: Secondary | ICD-10-CM

## 2019-02-28 LAB — CBC WITH DIFFERENTIAL/PLATELET
Abs Immature Granulocytes: 0.02 10*3/uL (ref 0.00–0.07)
Basophils Absolute: 0 10*3/uL (ref 0.0–0.1)
Basophils Relative: 0 %
Eosinophils Absolute: 0 10*3/uL (ref 0.0–0.5)
Eosinophils Relative: 0 %
HCT: 48.1 % (ref 39.0–52.0)
Hemoglobin: 15.7 g/dL (ref 13.0–17.0)
Immature Granulocytes: 0 %
Lymphocytes Relative: 11 %
Lymphs Abs: 1 10*3/uL (ref 0.7–4.0)
MCH: 29.2 pg (ref 26.0–34.0)
MCHC: 32.6 g/dL (ref 30.0–36.0)
MCV: 89.4 fL (ref 80.0–100.0)
Monocytes Absolute: 1.2 10*3/uL — ABNORMAL HIGH (ref 0.1–1.0)
Monocytes Relative: 13 %
Neutro Abs: 7.1 10*3/uL (ref 1.7–7.7)
Neutrophils Relative %: 76 %
Platelets: 149 10*3/uL — ABNORMAL LOW (ref 150–400)
RBC: 5.38 MIL/uL (ref 4.22–5.81)
RDW: 12.7 % (ref 11.5–15.5)
WBC: 9.3 10*3/uL (ref 4.0–10.5)
nRBC: 0 % (ref 0.0–0.2)

## 2019-02-28 LAB — BASIC METABOLIC PANEL
Anion gap: 7 (ref 5–15)
BUN: 8 mg/dL (ref 6–20)
CO2: 23 mmol/L (ref 22–32)
Calcium: 9.3 mg/dL (ref 8.9–10.3)
Chloride: 109 mmol/L (ref 98–111)
Creatinine, Ser: 0.89 mg/dL (ref 0.61–1.24)
GFR calc Af Amer: >60 mL/min (ref >60)
GFR calc non Af Amer: >60 mL/min (ref >60)
Glucose, Bld: 140 mg/dL — ABNORMAL HIGH (ref 70–99)
Potassium: 4.1 mmol/L (ref 3.5–5.1)
Sodium: 139 mmol/L (ref 135–145)

## 2019-02-28 LAB — PROTIME-INR
INR: 1.1 (ref 0.8–1.2)
Prothrombin Time: 14.1 seconds (ref 11.4–15.2)

## 2019-02-28 LAB — GLUCOSE, CAPILLARY
Glucose-Capillary: 132 mg/dL — ABNORMAL HIGH (ref 70–99)
Glucose-Capillary: 135 mg/dL — ABNORMAL HIGH (ref 70–99)
Glucose-Capillary: 166 mg/dL — ABNORMAL HIGH (ref 70–99)
Glucose-Capillary: 120 mg/dL — ABNORMAL HIGH (ref 70–99)
Glucose-Capillary: 112 mg/dL — ABNORMAL HIGH (ref 70–99)
Glucose-Capillary: 107 mg/dL — ABNORMAL HIGH (ref 70–99)
Glucose-Capillary: 97 mg/dL (ref 70–99)

## 2019-02-28 LAB — TROPONIN I: Troponin I: <0.03 ng/mL (ref <0.03)

## 2019-02-28 LAB — MAGNESIUM: Magnesium: 2 mg/dL (ref 1.7–2.4)

## 2019-02-28 LAB — PHOSPHORUS: Phosphorus: 2.8 mg/dL (ref 2.5–4.6)

## 2019-02-28 LAB — LACTIC ACID, PLASMA: Lactic Acid, Venous: 1.2 mmol/L (ref 0.5–1.9)

## 2019-02-28 MED ORDER — ORAL CARE MOUTH RINSE
15.0000 mL | Freq: Two times a day (BID) | OROMUCOSAL | Status: DC
  Administered 2019-03-04: 15 mL via OROMUCOSAL

## 2019-02-28 MED ORDER — DEXMEDETOMIDINE HCL IN NACL 400 MCG/100ML IV SOLN
0.4000 ug/kg/h | INTRAVENOUS | Status: DC
  Administered 2019-02-28: 0.9 ug/kg/h via INTRAVENOUS
  Administered 2019-02-28: 0.938 ug/kg/h via INTRAVENOUS
  Filled 2019-02-28 (×2): qty 100

## 2019-02-28 MED ORDER — ENOXAPARIN SODIUM 30 MG/0.3ML ~~LOC~~ SOLN
30.0000 mg | SUBCUTANEOUS | Status: DC
  Administered 2019-02-28: 30 mg via SUBCUTANEOUS
  Filled 2019-02-28: qty 0.3

## 2019-02-28 MED ORDER — ENOXAPARIN SODIUM 40 MG/0.4ML ~~LOC~~ SOLN
40.0000 mg | SUBCUTANEOUS | Status: DC
  Filled 2019-02-28 (×2): qty 0.4

## 2019-02-28 MED ORDER — SODIUM CHLORIDE 0.9 % IV SOLN
1.5000 g | Freq: Three times a day (TID) | INTRAVENOUS | Status: DC
  Administered 2019-02-28 – 2019-03-01 (×4): 1.5 g via INTRAVENOUS
  Filled 2019-02-28 (×8): qty 1.5

## 2019-02-28 NOTE — Progress Notes (Signed)
Pharmacy Antibiotic Note  [name] is a 48 y.o. male admitted on 02/26/2019 for sepsis; now de-escalating to PNA coverage only with unasyn.   Presenting after attempted overdose. WBC 9.3, Scr 0.89 (CrCl >100 mL/min), tMax 98.7. Chest x-ray 6/22 obtained and reviewed which shows mild right basal infiltrate  Plan: Start Unasyn 1.5g IV every 8 hours Monitor renal fx, cx results, clinical pic, and transition to oral options when able.  Height: 6' (182.9 cm) Weight: 187 lb 13.3 oz (85.2 kg) IBW/kg (Calculated) : 77.6  Temp (24hrs), Avg:99.8 F (37.7 C), Min:98.7 F (37.1 C), Max:102.5 F (39.2 C)  Recent Labs  Lab 02/26/19 1815 02/27/19 0315 02/28/19 0320  WBC 5.9 8.6 9.3  CREATININE 0.87 0.83 0.89  LATICACIDVEN  --   --  1.2    Estimated Creatinine Clearance: 110.2 mL/min (by C-G formula based on SCr of 0.89 mg/dL).    Allergies  Allergen Reactions  . Amoxicillin Diarrhea    Has patient had a PCN reaction causing immediate rash, facial/tongue/throat swelling, SOB or lightheadedness with hypotension: No Has patient had a PCN reaction causing severe rash involving mucus membranes or skin necrosis: No Has patient had a PCN reaction that required hospitalization: No Has patient had a PCN reaction occurring within the last 10 years: No If all of the above answers are "NO", then may proceed with Cephalosporin use.   Marland Kitchen Dextromethorphan-Guaifenesin Other (See Comments)    unknown  . Haloperidol Other (See Comments)    unspecified  . Meloxicam Other (See Comments)    unspecified  . Nsaids Other (See Comments)    Rectal bleeding  . Prednisone Other (See Comments)    Psychotic episodes   . Quetiapine Other (See Comments)    Psychosis with high dose (600mg )  . Sudafed [Pseudoephedrine Hcl] Other (See Comments)    Hallucinations  . Ziprasidone Other (See Comments)    unspecified    Antimicrobials this admission: Vanc 6/21 >> 6/22 Zosyn 6/21 >> 6/22  Dose adjustments this  admission: N/A  Microbiology results: 6/21 BCx: NGTD 6/20 MRSA PCR: pos 6/20 COVID: neg  Thank you for allowing pharmacy to be a part of this patient's care.  Tamela Gammon, PharmD 02/28/2019 12:24 PM PGY-1 Pharmacy Resident Direct Phone: [phone] Please check [email or web address] for unit-specific pharmacist phone numbers

## 2019-02-28 NOTE — Procedures (Signed)
Extubation Procedure Note  Patient Details:   Name: [name] DOB: 1971-03-30 MRN: [id number]   Airway Documentation:    Vent end date: 02/28/19 Vent end time: 1140   Evaluation  O2 sats: stable throughout Complications: No apparent complications Patient did tolerate procedure well. Bilateral Breath Sounds: Rhonchi, Diminished   Pt extubated per MD order.  Pt had +cuff leak & able to voice. Placed on 2L [place] tolerating well   Ciro Backer 02/28/2019, 11:54 AM

## 2019-02-28 NOTE — Consult Note (Signed)
Attempted to assess patient today, however patient is still very sedated.  Patient was unable to answer questions effectively and could not stay awake long enough to have any type of conversation.  Patient did state that he does not know why he was here and he does not remember what happened.  He was informed that he had overdosed and when asked what he had taken there was no response and patient was back asleep.  Suspect that this may be due to the medications he has been getting for being intubated.  However based on patient's chart review suspect the patient will need inpatient hospitalization, but will have psychiatry follow-up with patient tomorrow to see if he is more alert and oriented.

## 2019-02-28 NOTE — Progress Notes (Signed)
Initial Nutrition Assessment  DOCUMENTATION CODES:   Not applicable  INTERVENTION:    Ensure Enlive po BID, each supplement provides 350 kcal and 20 grams of protein once diet advanced   NUTRITION DIAGNOSIS:   Inadequate oral intake related to inability to eat as evidenced by NPO status.  GOAL:   Patient will meet greater than or equal to 90% of their needs  MONITOR:   PO intake, Supplement acceptance, Diet advancement, Skin, Weight trends, Labs, I & O's  REASON FOR ASSESSMENT:   Consult Enteral/tube feeding initiation and management  ASSESSMENT:   Patient with PMH significant for COPD and schizoaffective with past suicide attempts. Presents this admission after intentional overdose. UDS positive for opiates and benzos.   6/22- extubated   RD working remotely.  Vital HP started at 40 ml/hr + 30 ml Prostat BID yesterday. Pt extubated this am and diet has not been advanced. RD to provide supplements once able. Will attempt to speak with pt to obtain nutrition/weight history if possible.   Per chart records, pt weighed 89.8 kg on 06/25/18 and 85.2 kg this admission (5.1% wt loss in 8 months, insignificant for time frame).   I/O: +2,949 ml since admit UOP: 570 ml x 24 hrs    Drips: LR @ 75 ml/hr  Labs: CBG 120-166  Diet Order:   Diet Order            Diet NPO time specified  Diet effective now              EDUCATION NEEDS:   Not appropriate for education at this time  Skin:  Skin Assessment: Skin Integrity Issues: Skin Integrity Issues:: Other (Comment) Other: R elbow wound  Last BM:  6/20  Height:   Ht Readings from Last 1 Encounters:  02/26/19 6' (1.829 m)    Weight:   Wt Readings from Last 1 Encounters:  02/28/19 85.2 kg    Ideal Body Weight:  80.9 kg  BMI:  Body mass index is 25.47 kg/m.  Estimated Nutritional Needs:   Kcal:  2200-2400 kcal  Protein:  105-120 grams  Fluid:  >/= 2.2 L/day   Mariana Single RD, LDN Clinical  Nutrition Pager # - [phone]

## 2019-02-28 NOTE — Progress Notes (Addendum)
..   NAME:  [name], MRN:  [id number], DOB:  1970/11/11, LOS: 2 ADMISSION DATE:  02/26/2019, CONSULTATION DATE:  02/26/2019 REFERRING MD:  Ronnald Nian MD, CHIEF COMPLAINT:  Altered mental status   Brief History   48 yo M w/ PMHx COPD, Asthma, Schizoaffective w/ past suicide attempts the most recent documented in EMR was n March 2020. Per EMS they were called to this patient's home one week ago for suicidal ideation.   Per Alta Corning note: Pt's son found him lying on the floor at 1715 with an empty bottle of hydrocodone and other pill bottles in the vicinity. Last known normal was before 1615. The ingestion was not witnessed. Family thought pt was initially in cardiac arrest and started CPR but on arrival of EMD pt was found to have shallow breathing and a weak pulse.  There was a note written on the skin of the patient's chest that said "please DNR." EMS administered intranasal narcan which improved pt's resp rate.   Per EDP note: Patient was prescribed Norco 10 mg and Xanax 1 mg, also prescribed Zyprexa, and Seroquel. It can not be confirmed at this time how much or what pt took exactly.  Third party report: "Patient last had more than 100 Xanax tablets prescribed, dispensed a few weeks ago, had around 75 Norco's filled a few weeks ago, all pill bottles empty per EMS and family. Attempting to actually obtain pill bottles from family"   Significant Hospital Events   Endotracheally intubated >>>>>02/26/2019  Consults:  PCCM>>>>>>>>02/26/2019  Procedures:  ETT 6/20 >> 6/22  Significant Diagnostic Tests:  UDS: + opiates and benzodiazepines ABG: 7.354/47/81/26  CT Head w/o contrast:  Tiny remote lacunar infarct in right basal ganglia.    EKG: Sinus rhythm  QTc 423  Micro Data:  SARSCOV2>>>>>>>negative 02/26/2019  Antimicrobials:  none     SUBJECTIVE/OVERNIGHT/INTERVAL HX   Afebrile Critically ill, intubated Mild agitation on Precedex drip Low urine output   Objective   Blood  pressure 117/74, pulse (!) 57, temperature 98.7 F (37.1 C), temperature source Oral, resp. rate (!) 30, height 6' (1.829 m), weight 85.2 kg, SpO2 95 %.    Vent Mode: PSV;CPAP FiO2 (%):  [30 %-40 %] 30 % Set Rate:  [16 bmp] 16 bmp Vt Set:  [620 mL] 620 mL PEEP:  [5 cmH20] 5 cmH20 Pressure Support:  [5 cmH20] 5 cmH20 Plateau Pressure:  [15 cmH20-18 cmH20] 18 cmH20   Intake/Output Summary (Last 24 hours) at 02/28/2019 1147 Last data filed at 02/28/2019 0900 Gross per 24 hour  Intake 2513.41 ml  Output 420 ml  Net 2093.41 ml   Filed Weights   02/26/19 2345 02/27/19 0500 02/28/19 0329  Weight: 85.3 kg 85.3 kg 85.2 kg     Middle-aged, acutely ill, intubated Mild pallor, no icterus Somnolent but arouses easily with mild agitation, pupils 3 mm bilaterally equally reactive to light, no meningeal signs, follows commands S1-S2 regular, no murmur Clear breath sounds bilateral, no rhonchi Soft nontender abdomen No pedal edema   Assessment & Plan:  ASSESSMENT / PLAN:  RESPIRATORY A:  Acute respiratory failure due to intentional OD   P:   Continue spontaneous breathing trials, he was able to tolerate pressure support weaning and will be extubated    PSYCHIATRY A:   Baseline: pt is a Schizophrenic w/ mood instability with a history of previous attempts -  Pt was also Rx Zyprexa, and Seroquel.   Admit 02/26/2019 - interntional OD with note "DNR" on him.  U Tox positive for benzo and opioids. >third party reporting of empty bottles of Norco, and Xanax.  P:   1:1 sitter Pysch consult when more awake   NEUROLOGIC A:   Acute encephalopathy due to overdose, resolving P:   Fluids Supportive care   Fever-started on empiric Vanco and Zosyn overnight for aspiration  Chest x-ray 6/22 obtained and reviewed which shows mild right basal infiltrate, will change to Unasyn empiric and obtain respiratory culture if able     Best practice:  Diet: NPO for now. Start TF  Pain/Anxiety/Delirium protocol (if indicated): hold all sedative medications in order to assess neurological status VAP protocol (if indicated): yes DVT prophylaxis: Lovenox  w/ SCDs GI prophylaxis: PPI Glucose control: ISS if BG exceeds 180mg /dl Mobility: bedrest Code Status: Full Family Communication: 6/20 - Mother, son and sister listed on Facesheet spoke to son who states that his Dad texted him on 6/18. .  Patient lives alone and family is not aware of behavioral health follow up.     Disposition: ICU   The patient is critically ill with multiple organ systems failure and requires high complexity decision making for assessment and support, frequent evaluation and titration of therapies, application of advanced monitoring technologies and extensive interpretation of multiple databases. Critical Care Time devoted to patient care services described in this note independent of APP/resident  time is 33 minutes.   Kara Mead MD. Shade Flood. [facility] Pager [phone] If no response call [phone]     02/28/2019 11:47 AM   .

## 2019-03-01 ENCOUNTER — Inpatient Hospital Stay (HOSPITAL_COMMUNITY): Payer: Medicare Other

## 2019-03-01 DIAGNOSIS — T1491XA Suicide attempt, initial encounter: Secondary | ICD-10-CM

## 2019-03-01 DIAGNOSIS — R451 Restlessness and agitation: Secondary | ICD-10-CM

## 2019-03-01 DIAGNOSIS — F19188 Other psychoactive substance abuse with other psychoactive substance-induced disorder: Secondary | ICD-10-CM

## 2019-03-01 DIAGNOSIS — F200 Paranoid schizophrenia: Secondary | ICD-10-CM

## 2019-03-01 LAB — CBC WITH DIFFERENTIAL/PLATELET
Abs Immature Granulocytes: 0.03 10*3/uL (ref 0.00–0.07)
Basophils Absolute: 0.1 10*3/uL (ref 0.0–0.1)
Basophils Relative: 1 %
Eosinophils Absolute: 0 10*3/uL (ref 0.0–0.5)
Eosinophils Relative: 0 %
HCT: 44.1 % (ref 39.0–52.0)
Hemoglobin: 14.6 g/dL (ref 13.0–17.0)
Immature Granulocytes: 0 %
Lymphocytes Relative: 18 %
Lymphs Abs: 1.7 10*3/uL (ref 0.7–4.0)
MCH: 29.3 pg (ref 26.0–34.0)
MCHC: 33.1 g/dL (ref 30.0–36.0)
MCV: 88.6 fL (ref 80.0–100.0)
Monocytes Absolute: 1.1 10*3/uL — ABNORMAL HIGH (ref 0.1–1.0)
Monocytes Relative: 11 %
Neutro Abs: 6.5 10*3/uL (ref 1.7–7.7)
Neutrophils Relative %: 70 %
Platelets: 165 10*3/uL (ref 150–400)
RBC: 4.98 MIL/uL (ref 4.22–5.81)
RDW: 12.5 % (ref 11.5–15.5)
WBC: 9.4 10*3/uL (ref 4.0–10.5)
nRBC: 0 % (ref 0.0–0.2)

## 2019-03-01 LAB — BASIC METABOLIC PANEL
Anion gap: 9 (ref 5–15)
BUN: 8 mg/dL (ref 6–20)
CO2: 23 mmol/L (ref 22–32)
Calcium: 9.1 mg/dL (ref 8.9–10.3)
Chloride: 110 mmol/L (ref 98–111)
Creatinine, Ser: 1.02 mg/dL (ref 0.61–1.24)
GFR calc Af Amer: >60 mL/min (ref >60)
GFR calc non Af Amer: >60 mL/min (ref >60)
Glucose, Bld: 108 mg/dL — ABNORMAL HIGH (ref 70–99)
Potassium: 3.9 mmol/L (ref 3.5–5.1)
Sodium: 142 mmol/L (ref 135–145)

## 2019-03-01 LAB — PHOSPHORUS: Phosphorus: 3.1 mg/dL (ref 2.5–4.6)

## 2019-03-01 LAB — GLUCOSE, CAPILLARY
Glucose-Capillary: 101 mg/dL — ABNORMAL HIGH (ref 70–99)
Glucose-Capillary: 93 mg/dL (ref 70–99)
Glucose-Capillary: 107 mg/dL — ABNORMAL HIGH (ref 70–99)
Glucose-Capillary: 79 mg/dL (ref 70–99)
Glucose-Capillary: 151 mg/dL — ABNORMAL HIGH (ref 70–99)

## 2019-03-01 LAB — MAGNESIUM: Magnesium: 2 mg/dL (ref 1.7–2.4)

## 2019-03-01 MED ORDER — LORAZEPAM 1 MG PO TABS
1.0000 mg | ORAL_TABLET | ORAL | Status: DC | PRN
  Administered 2019-03-01 – 2019-03-02 (×4): 1 mg via ORAL
  Filled 2019-03-01 (×4): qty 1

## 2019-03-01 MED ORDER — LORAZEPAM 2 MG/ML IJ SOLN
2.0000 mg | Freq: Once | INTRAMUSCULAR | Status: AC | PRN
  Administered 2019-03-02: 2 mg via INTRAMUSCULAR
  Filled 2019-03-01: qty 1

## 2019-03-01 MED ORDER — AMOXICILLIN-POT CLAVULANATE 875-125 MG PO TABS
1.0000 | ORAL_TABLET | Freq: Two times a day (BID) | ORAL | Status: DC
  Filled 2019-03-01 (×2): qty 1

## 2019-03-01 MED ORDER — HALOPERIDOL LACTATE 5 MG/ML IJ SOLN
5.0000 mg | Freq: Once | INTRAMUSCULAR | Status: AC
  Administered 2019-03-01: 5 mg via INTRAVENOUS
  Filled 2019-03-01: qty 1

## 2019-03-01 MED ORDER — OLANZAPINE 10 MG PO TABS
10.0000 mg | ORAL_TABLET | Freq: Every day | ORAL | Status: DC
  Filled 2019-03-01: qty 1

## 2019-03-01 MED ORDER — QUETIAPINE FUMARATE 25 MG PO TABS
100.0000 mg | ORAL_TABLET | Freq: Two times a day (BID) | ORAL | Status: DC
  Administered 2019-03-01 – 2019-03-04 (×6): 100 mg via ORAL
  Filled 2019-03-01: qty 4
  Filled 2019-03-01 (×4): qty 1
  Filled 2019-03-01: qty 4
  Filled 2019-03-01: qty 1

## 2019-03-01 MED ORDER — OLANZAPINE 10 MG IM SOLR
10.0000 mg | Freq: Once | INTRAMUSCULAR | Status: AC | PRN
  Administered 2019-03-02: 10 mg via INTRAMUSCULAR
  Filled 2019-03-01: qty 10

## 2019-03-01 MED ORDER — HALOPERIDOL LACTATE 5 MG/ML IJ SOLN: 2.5000 mg | INTRAMUSCULAR | Status: DC | PRN

## 2019-03-01 MED ORDER — OLANZAPINE 5 MG PO TABS
15.0000 mg | ORAL_TABLET | Freq: Every day | ORAL | Status: DC
  Administered 2019-03-01: 10 mg via ORAL
  Filled 2019-03-01: qty 2
  Filled 2019-03-01: qty 3

## 2019-03-01 MED ORDER — LORAZEPAM 2 MG/ML IJ SOLN: 2.0000 mg | INTRAMUSCULAR | Status: DC | PRN

## 2019-03-01 MED ORDER — DEXMEDETOMIDINE HCL IN NACL 200 MCG/50ML IV SOLN
0.4000 ug/kg/h | INTRAVENOUS | Status: DC
  Administered 2019-03-01 (×2): 0.4 ug/kg/h via INTRAVENOUS
  Administered 2019-03-01: 0.8 ug/kg/h via INTRAVENOUS
  Filled 2019-03-01 (×3): qty 50

## 2019-03-01 MED ORDER — ALPRAZOLAM 0.5 MG PO TABS: 0.5000 mg | ORAL_TABLET | Freq: Three times a day (TID) | ORAL | Status: DC | PRN

## 2019-03-01 NOTE — Progress Notes (Addendum)
CSW received a call from pt's Unit CN requesting ?IVC paperwoek as directed by the provider.  CSW filled out demographic data, gave instruction on use to CN and faxed back to Unit CN at [facility]: [phone].  7:00 PM CN received IVC Affidavit and 1st Exam and will bring to provider to sign in front of [place] on duty at [facility].  8:02 PM CSW received a call from CN who is taking documents to provider now.  CSW will continue to follow for D/C needs.  Alphonse Guild. Drinda Belgard, LCSW, LCAS, CSI Transitions of Care Clinical Social Worker Care Coordination Department Ph: [phone]

## 2019-03-01 NOTE — Progress Notes (Signed)
Pt verbally aggressive and refusing medications this AM. CCM notified. Will continue to monitor pt.

## 2019-03-01 NOTE — Progress Notes (Signed)
Patient admitted by [facility].  Tried hospitalist were called yesterday to take over patient starting today however when I went to see patient in the unit, I was informed by [facility] nurse practitioner and the nurses that patient still is on Precedex.  I had a discussion with PCCM and they had agreed to see the patient today with transition to tried hospitalist tomorrow with the hope that he will be off of Precedex.

## 2019-03-01 NOTE — Consult Note (Signed)
Telepsych Consultation   Reason for Consult:  Psychotic disorder Referring Physician:  IM service Location of Patient:  Location of Provider: [facility]  Patient Identification: [name] MRN:  [id number] Principal Diagnosis: <principal problem not specified> Diagnosis:  Active Problems:   Polysubstance overdose   Acute respiratory failure with hypoxia ([place])   Total Time spent with patient: 20 minutes  Subjective:   [name] is a 48 y.o. male patient admitted with a PPHx significant for schizoaffective disorder and polysubstance dependence.  HPI:  Patient is seen and examined. Patient is a 48 YO male with the above stated PPHx who was admitted on 02/26/19 after a suicide attempt with intentional overdose of benzodiazepines and opiates. Attempts to evaluate the patient on 6/22 were limited secondary to oversedation. Patient is currently moderately agitated, paranoid. Still believes that family is plotting against him and that he "needs a PET scan to tell me what is wrong with me"  Past Psychiatric History: Patient has previously been diagnosed with schizophrenia as well as schizoaffective disorder. His last psychiatric admission was to [facility] on 11/29/18. At that time he had been transferred for [facility] after an intentional overdose of zyprexa, perphenazine, and other meds. He was discharged on seroquel and zyprexa for psychosis.  Risk to Self:   Risk to Others:   Prior Inpatient Therapy:   Prior Outpatient Therapy:    Past Medical History:  Past Medical History:  Diagnosis Date  . Asthma   . CHF (congestive heart failure) ([place])   . Chronic back pain   . COPD (chronic obstructive pulmonary disease) ([place])   . Knee pain, chronic   . Migraines   . Schizophrenia, schizo-affective ([place])     Past Surgical History:  Procedure Laterality Date  . KNEE SURGERY     four times  . WISDOM TOOTH EXTRACTION     Family History:  Family History  Problem Relation Age  of Onset  . Hypertension Father    Family Psychiatric  History: non-contributory Social History:  Social History   Substance and Sexual Activity  Alcohol Use No   Comment: occ     Social History   Substance and Sexual Activity  Drug Use No   Comment: Pt denied    Social History   Socioeconomic History  . Marital status: Widowed    Spouse name: Not on file  . Number of children: Not on file  . Years of education: Not on file  . Highest education level: Not on file  Occupational History  . Occupation: on disability  Social Needs  . Financial resource strain: Not on file  . Food insecurity    Worry: Not on file    Inability: Not on file  . Transportation needs    Medical: Not on file    Non-medical: Not on file  Tobacco Use  . Smoking status: Current Every Day Smoker    Packs/day: 2.00    Years: 33.00    Pack years: 66.00    Types: Cigarettes  . Smokeless tobacco: Never Used  Substance and Sexual Activity  . Alcohol use: No    Comment: occ  . Drug use: No    Comment: Pt denied  . Sexual activity: Not Currently  Lifestyle  . Physical activity    Days per week: Not on file    Minutes per session: Not on file  . Stress: Not on file  Relationships  . Social Herbalist on phone:  Not on file    Gets together: Not on file    Attends religious service: Not on file    Active member of club or organization: Not on file    Attends meetings of clubs or organizations: Not on file    Relationship status: Not on file  Other Topics Concern  . Not on file  Social History Narrative   Pt is a widower.  He lives alone, and he receives SSI Disability.  Pt was followed by Dr. Darleene Cleaver, but he has not seen Dr. Darleene Cleaver in several months.   Additional Social History:    Allergies:   Allergies  Allergen Reactions  . Amoxicillin Diarrhea    Has patient had a PCN reaction causing immediate rash, facial/tongue/throat swelling, SOB or lightheadedness with  hypotension: No Has patient had a PCN reaction causing severe rash involving mucus membranes or skin necrosis: No Has patient had a PCN reaction that required hospitalization: No Has patient had a PCN reaction occurring within the last 10 years: No If all of the above answers are "NO", then may proceed with Cephalosporin use.   Marland Kitchen Dextromethorphan-Guaifenesin Other (See Comments)    unknown  . Haloperidol Other (See Comments)    unspecified  . Meloxicam Other (See Comments)    unspecified  . Nsaids Other (See Comments)    Rectal bleeding  . Prednisone Other (See Comments)    Psychotic episodes   . Quetiapine Other (See Comments)    Psychosis with high dose (600mg )  . Sudafed [Pseudoephedrine Hcl] Other (See Comments)    Hallucinations  . Ziprasidone Other (See Comments)    unspecified    Labs:  Results for orders placed or performed during the hospital encounter of 02/26/19 (from the past 48 hour(s))  Glucose, capillary     Status: Abnormal   Collection Time: 02/27/19  2:51 PM  Result Value Ref Range   Glucose-Capillary 113 (H) 70 - 99 mg/dL  Glucose, capillary     Status: Abnormal   Collection Time: 02/27/19  7:40 PM  Result Value Ref Range   Glucose-Capillary 128 (H) 70 - 99 mg/dL  Culture, blood (routine x 2)     Status: None (Preliminary result)   Collection Time: 02/27/19  8:56 PM   Specimen: BLOOD LEFT HAND  Result Value Ref Range   Specimen Description BLOOD LEFT HAND    Special Requests      BOTTLES DRAWN AEROBIC AND ANAEROBIC Blood Culture adequate volume   Culture      NO GROWTH 2 DAYS Performed at [facility], [place] [address], [place], [place] [address]    Report Status PENDING   Culture, blood (routine x 2)     Status: None (Preliminary result)   Collection Time: 02/27/19  9:00 PM   Specimen: BLOOD RIGHT HAND  Result Value Ref Range   Specimen Description BLOOD RIGHT HAND    Special Requests      BOTTLES DRAWN AEROBIC AND ANAEROBIC Blood Culture  adequate volume   Culture      NO GROWTH 2 DAYS Performed at [facility], [place] [address], [place], [place] [address]    Report Status PENDING   Glucose, capillary     Status: Abnormal   Collection Time: 02/27/19 11:26 PM  Result Value Ref Range   Glucose-Capillary 132 (H) 70 - 99 mg/dL  Glucose, capillary     Status: Abnormal   Collection Time: 02/28/19  3:10 AM  Result Value Ref Range  Glucose-Capillary 135 (H) 70 - 99 mg/dL  Basic metabolic panel     Status: Abnormal   Collection Time: 02/28/19  3:20 AM  Result Value Ref Range   Sodium 139 135 - 145 mmol/L   Potassium 4.1 3.5 - 5.1 mmol/L   Chloride 109 98 - 111 mmol/L   CO2 23 22 - 32 mmol/L   Glucose, Bld 140 (H) 70 - 99 mg/dL   BUN 8 6 - 20 mg/dL   Creatinine, Ser 0.89 0.61 - 1.24 mg/dL   Calcium 9.3 8.9 - 10.3 mg/dL   GFR calc non Af Amer >60 >60 mL/min   GFR calc Af Amer >60 >60 mL/min   Anion gap 7 5 - 15    Comment: Performed at [facility], [place] [address] [place], [place] [address]  Lactic acid, plasma     Status: None   Collection Time: 02/28/19  3:20 AM  Result Value Ref Range   Lactic Acid, Venous 1.2 0.5 - 1.9 mmol/L    Comment: Performed at [facility] [address], [place], [place] [address]  CBC with Differential/Platelet     Status: Abnormal   Collection Time: 02/28/19  3:20 AM  Result Value Ref Range   WBC 9.3 4.0 - 10.5 K/uL   RBC 5.38 4.22 - 5.81 MIL/uL   Hemoglobin 15.7 13.0 - 17.0 g/dL   HCT 48.1 39.0 - 52.0 %   MCV 89.4 80.0 - 100.0 fL   MCH 29.2 26.0 - 34.0 pg   MCHC 32.6 30.0 - 36.0 g/dL   RDW 12.7 11.5 - 15.5 %   Platelets 149 (L) 150 - 400 K/uL   nRBC 0.0 0.0 - 0.2 %   Neutrophils Relative % 76 %   Neutro Abs 7.1 1.7 - 7.7 K/uL   Lymphocytes Relative 11 %   Lymphs Abs 1.0 0.7 - 4.0 K/uL   Monocytes Relative 13 %   Monocytes Absolute 1.2 (H) 0.1 - 1.0 K/uL   Eosinophils Relative 0 %   Eosinophils Absolute 0.0 0.0 - 0.5 K/uL   Basophils Relative 0 %    Basophils Absolute 0.0 0.0 - 0.1 K/uL   Immature Granulocytes 0 %   Abs Immature Granulocytes 0.02 0.00 - 0.07 K/uL    Comment: Performed at [facility] [address], [place], [place] [address]  Magnesium     Status: None   Collection Time: 02/28/19  3:20 AM  Result Value Ref Range   Magnesium 2.0 1.7 - 2.4 mg/dL    Comment: Performed at [facility] [address] [place], [place] [address]  Phosphorus     Status: None   Collection Time: 02/28/19  3:20 AM  Result Value Ref Range   Phosphorus 2.8 2.5 - 4.6 mg/dL    Comment: Performed at [facility] [address] [place], [place] [address]  Protime-INR     Status: None   Collection Time: 02/28/19  3:20 AM  Result Value Ref Range   Prothrombin Time 14.1 11.4 - 15.2 seconds   INR 1.1 0.8 - 1.2    Comment: (NOTE) INR goal varies based on device and disease states. Performed at [facility], [place] [address] [place], [name] [address]   Troponin I - Tomorrow AM 0500     Status: None   Collection Time: 02/28/19  3:20 AM  Result Value Ref Range   Troponin I <0.03 <0.03 ng/mL    Comment:  Performed at [facility], [place] [address] [place], [place] [address]  Glucose, capillary     Status: Abnormal   Collection Time: 02/28/19  8:21 AM  Result Value Ref Range   Glucose-Capillary 166 (H) 70 - 99 mg/dL   Comment 1 Document in Chart   Glucose, capillary     Status: Abnormal   Collection Time: 02/28/19 11:53 AM  Result Value Ref Range   Glucose-Capillary 120 (H) 70 - 99 mg/dL   Comment 1 Document in Chart   Glucose, capillary     Status: Abnormal   Collection Time: 02/28/19  5:16 PM  Result Value Ref Range   Glucose-Capillary 112 (H) 70 - 99 mg/dL  Glucose, capillary     Status: Abnormal   Collection Time: 02/28/19  7:22 PM  Result Value Ref Range   Glucose-Capillary 107 (H) 70 - 99 mg/dL  Glucose, capillary     Status: None   Collection Time: 02/28/19 11:17 PM  Result Value Ref Range    Glucose-Capillary 97 70 - 99 mg/dL  Glucose, capillary     Status: Abnormal   Collection Time: 03/01/19  3:23 AM  Result Value Ref Range   Glucose-Capillary 101 (H) 70 - 99 mg/dL  CBC with Differential/Platelet     Status: Abnormal   Collection Time: 03/01/19  6:28 AM  Result Value Ref Range   WBC 9.4 4.0 - 10.5 K/uL   RBC 4.98 4.22 - 5.81 MIL/uL   Hemoglobin 14.6 13.0 - 17.0 g/dL   HCT 44.1 39.0 - 52.0 %   MCV 88.6 80.0 - 100.0 fL   MCH 29.3 26.0 - 34.0 pg   MCHC 33.1 30.0 - 36.0 g/dL   RDW 12.5 11.5 - 15.5 %   Platelets 165 150 - 400 K/uL   nRBC 0.0 0.0 - 0.2 %   Neutrophils Relative % 70 %   Neutro Abs 6.5 1.7 - 7.7 K/uL   Lymphocytes Relative 18 %   Lymphs Abs 1.7 0.7 - 4.0 K/uL   Monocytes Relative 11 %   Monocytes Absolute 1.1 (H) 0.1 - 1.0 K/uL   Eosinophils Relative 0 %   Eosinophils Absolute 0.0 0.0 - 0.5 K/uL   Basophils Relative 1 %   Basophils Absolute 0.1 0.0 - 0.1 K/uL   Immature Granulocytes 0 %   Abs Immature Granulocytes 0.03 0.00 - 0.07 K/uL    Comment: Performed at [facility], [address], [place], [place] [address]  Magnesium     Status: None   Collection Time: 03/01/19  6:28 AM  Result Value Ref Range   Magnesium 2.0 1.7 - 2.4 mg/dL    Comment: Performed at [facility] [address] [place], [place] [address]  Phosphorus     Status: None   Collection Time: 03/01/19  6:28 AM  Result Value Ref Range   Phosphorus 3.1 2.5 - 4.6 mg/dL    Comment: Performed at [facility] [address] [place], [place] [address]  Basic metabolic panel     Status: Abnormal   Collection Time: 03/01/19  6:28 AM  Result Value Ref Range   Sodium 142 135 - 145 mmol/L   Potassium 3.9 3.5 - 5.1 mmol/L   Chloride 110 98 - 111 mmol/L   CO2 23 22 - 32 mmol/L   Glucose, Bld 108 (H) 70 - 99 mg/dL   BUN 8 6 - 20 mg/dL   Creatinine, Ser 1.02 0.61 - 1.24 mg/dL  Calcium 9.1 8.9 - 10.3 mg/dL   GFR calc non Af Amer >60 >60 mL/min   GFR calc Af Amer >60  >60 mL/min   Anion gap 9 5 - 15    Comment: Performed at [facility] [address], [place], [place] [address]  Glucose, capillary     Status: None   Collection Time: 03/01/19  7:18 AM  Result Value Ref Range   Glucose-Capillary 93 70 - 99 mg/dL  Glucose, capillary     Status: Abnormal   Collection Time: 03/01/19 11:34 AM  Result Value Ref Range   Glucose-Capillary 107 (H) 70 - 99 mg/dL   Comment 1 Notify RN     Medications:  Current Facility-Administered Medications  Medication Dose Route Frequency Provider Last Rate Last Dose  . ALPRAZolam (XANAX) tablet 0.5 mg  0.5 mg Oral TID PRN Minor, Grace Bushy, NP      . ampicillin-sulbactam (UNASYN) 1.5 g in sodium chloride 0.9 % 100 mL IVPB  1.5 g Intravenous Q8H Rigoberto Noel, MD   Stopped at 03/01/19 0445  . Chlorhexidine Gluconate Cloth 2 % PADS 6 each  6 each Topical Daily Scatliffe, Rise Paganini, MD   6 each at 02/28/19 2010  . Chlorhexidine Gluconate Cloth 2 % PADS 6 each  6 each Topical Q0600 Scatliffe, Rise Paganini, MD   6 each at 02/28/19 0334  . enoxaparin (LOVENOX) injection 40 mg  40 mg Subcutaneous Q24H Kara Mead V, MD      . haloperidol lactate (HALDOL) injection 2.5-5 mg  2.5-5 mg Intravenous Q3H PRN Kara Mead V, MD      . ipratropium (ATROVENT) nebulizer solution 0.5 mg  0.5 mg Nebulization Q6H PRN Scatliffe, Cyril Mourning D, MD      . lactated ringers infusion   Intravenous Continuous Minor, Grace Bushy, NP 50 mL/hr at 03/01/19 0931    . MEDLINE mouth rinse  15 mL Mouth Rinse BID Scatliffe, Kristen D, MD      . mupirocin ointment (BACTROBAN) 2 % 1 application  1 application Nasal BID Scatliffe, Rise Paganini, MD   1 application at 25/05/39 0932  . OLANZapine (ZYPREXA) tablet 10 mg  10 mg Oral QHS Minor, Grace Bushy, NP      . QUEtiapine (SEROQUEL) tablet 100 mg  100 mg Oral BID Minor, Grace Bushy, NP        Musculoskeletal: Strength & Muscle Tone: within normal limits Gait & Station: lying in bed Patient leans: N/A  Psychiatric  Specialty Exam: Physical Exam  ROS  Blood pressure 101/65, pulse (!) 56, temperature 99.8 F (37.7 C), temperature source Oral, resp. rate (!) 25, height 6' (1.829 m), weight 85.2 kg, SpO2 97 %.Body mass index is 25.47 kg/m.  General Appearance: Disheveled  Eye Contact:  Fair  Speech:  Pressured  Volume:  Increased  Mood:  Anxious, Euphoric and Irritable  Affect:  Congruent  Thought Process:  Disorganized and Descriptions of Associations: Loose  Orientation:  Full (Time, Place, and Person)  Thought Content:  Delusions and Paranoid Ideation  Suicidal Thoughts:  No  Homicidal Thoughts:  No  Memory:  Immediate;   Poor Recent;   Poor Remote;   Poor  Judgement:  Impaired  Insight:  Lacking  Psychomotor Activity:  Restlessness  Concentration:  Concentration: Poor and Attention Span: Poor  Recall:  Poor  Fund of Knowledge:  Poor  Language:  Fair  Akathisia:  Negative  Handed:  Right  AIMS (if indicated):  Assets:  Desire for Improvement Resilience  ADL's:  Impaired  Cognition:  Impaired,  Moderate  Sleep:        Treatment Plan Summary: Daily contact with patient to assess and evaluate symptoms and progress in treatment, Medication management and Plan Patient is seen and examined. Patient is a 48 YO male with the above stated PPHx seen in consultation after an intentional polydrug OD.  Patient is seen and examined. 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 Once he is medically cleared, able to eat and drink with minimal assistance, ambulate and off IV fluids and antibiotics we will take the patient in transfer. I have made some changes to his medications including a IM prn of zyprexa, increased his po Zyprexa to 15 mg, and changed his xanax to lorazepam. It would also be beneficial to obtain a repeat chest x-ray from admission. Otherwise we will continue to follow the patient with you.  Disposition: Recommend  psychiatric Inpatient admission when medically cleared.   1) increase Zyprexa to 15 mg po q hs 2) prn zyprexa IM dosage available for significant agitation 3) continue seroquel 100 mg po BID 4) consider switch xanax to lorazepam 1-2 mg po/IM q 4 hours prn agtiation 5) once off IV antibiotics and meds we will consider transfer to inpatient psychiatric service 6) repeat CXR  This service was provided via telemedicine using a 2-way, interactive audio and video technology.  Names of all persons participating in this telemedicine service and their role in this encounter. Name: Myles Lipps MD Role: Psychiatrist/consultant  Name:            Sharma Covert, MD 03/01/2019 12:01 PM

## 2019-03-01 NOTE — Progress Notes (Signed)
PT Cancellation Note  Patient Details Name: [name] MRN: [id number] DOB: [birth year]   Cancelled Treatment:    Reason Eval/Treat Not Completed: Patient not medically ready. Pt verbally and physically aggressive trying to hurt himself and threatening to hurt others. RN reports they are awaiting pysch to consult today as they are very familiar with him. PT to return as able, if/when appropriate to complete PT eval.  Kittie Plater, PT, DPT Acute Rehabilitation Services Pager #: [phone] Office #: [phone]    [name] 03/01/2019, 10:49 AM

## 2019-03-01 NOTE — Progress Notes (Addendum)
CSW received a call from pt's CN stating pt's IVC paperwork is almost done.  CSW provided CN with the afterhours maistrate's number if paperwork is not completed before the CSW is off-duty:  Phone: [phone] Fax:      [phone]   Once paperwork is signed and complete it can be faxed to Fax:   [phone].  At that point the magistrate will fax the paperwork back to the fax# listed on the cover-sheet from where the paperwork was faxed from on [employer].  Non-emergency dispatch can then be called at: [phone] option 1 for english and then option 3 for dispatch and police can be requested to come "serve" the pt with the paperwork faxed back to the unit from the magistrate.  Once the paperwork is then served by police then 4 copies of everything will be made and placed in the patient's chart.  Please call the magistrate at ph: [phone] with any questions or concerns.  CSW will continue to follow for D/C needs.  [name]. [name] Standre, LCSW, LCAS, CSI Transitions of Care Clinical Social Worker Care Coordination Department Ph: [phone]

## 2019-03-01 NOTE — Progress Notes (Addendum)
[facility] Progress Note Patient Name: [name] DOB: 1970/12/23 MRN: [id number]   Date of Service  03/01/2019  HPI/Events of Note  Pt with schizoaffective disorder is agitated in the room.  He is trying to get out bed, and is speaking in a very loud voice, asking for antibiotics and the rest I could not comprehend. Sitter is at the bedside.  eICU Interventions  QTc is 422 on his EKG.  He listed allergy to ziprasidone, haldol and seroquel but reactions were unspecified. We will trial haldol 5mg  IV now.      Intervention Category Major Interventions: Delirium, psychosis, severe agitation - evaluation and management  Elsie Lincoln 03/01/2019, 1:27 AM   2:29 AM Pt was given haldol and was somewhat sleepy but is now back up in bed, very agitated.  Plan> Start on precedex gtt.  Psych consult in the AM.

## 2019-03-01 NOTE — Progress Notes (Signed)
Pt agitated & aggresive, hitting self against bed, yelling verbal threats, pulled out one IV, attempted to pull out foley, & trying to get OOB. Elink notified and order for 5 mg Haldol administered at 0139, however no improvement. Precedex gtt started per Dr. James Ivanoff.    Will continue to monitor.

## 2019-03-01 NOTE — Progress Notes (Signed)
..   NAME:  [name], MRN:  [id number], DOB:  Jul 14, 1971, LOS: 3 ADMISSION DATE:  02/26/2019, CONSULTATION DATE:  02/26/2019 REFERRING MD:  Ronnald Nian MD, CHIEF COMPLAINT:  Altered mental status   Brief History   48 yo M w/ PMHx COPD, Asthma, Schizoaffective w/ past suicide attempts the most recent documented in EMR was n March 2020. Per EMS they were called to this patient's home one week ago for suicidal ideation.   Per Alta Corning note: Pt's son found him lying on the floor at 1715 with an empty bottle of hydrocodone and other pill bottles in the vicinity. Last known normal was before 1615. The ingestion was not witnessed. Family thought pt was initially in cardiac arrest and started CPR but on arrival of EMD pt was found to have shallow breathing and a weak pulse.  There was a note written on the skin of the patient's chest that said "please DNR." EMS administered intranasal narcan which improved pt's resp rate.   Per EDP note: Patient was prescribed Norco 10 mg and Xanax 1 mg, also prescribed Zyprexa, and Seroquel. It can not be confirmed at this time how much or what pt took exactly.  Third party report: "Patient last had more than 100 Xanax tablets prescribed, dispensed a few weeks ago, had around 75 Norco's filled a few weeks ago, all pill bottles empty per EMS and family. Attempting to actually obtain pill bottles from family"   Significant Hospital Events   Endotracheally intubated >>>>>02/26/2019>> 22 2020  Consults:  PCCM>>>>>>>>02/26/2019  Procedures:  ETT 6/20 >> 6/22  Significant Diagnostic Tests:  UDS: + opiates and benzodiazepines ABG: 7.354/47/81/26  CT Head w/o contrast:  Tiny remote lacunar infarct in right basal ganglia.    EKG: Sinus rhythm  QTc 423  Micro Data:  SARSCOV2>>>>>>>negative 02/26/2019  Antimicrobials:  02/28/2019 Unasyn for 3 to 5 days>>    SUBJECTIVE/OVERNIGHT/INTERVAL HX   No acute distress. Intermittently acts out therefore was restarted on  home medications   Objective   Blood pressure 103/69, pulse 60, temperature 99.8 F (37.7 C), temperature source Oral, resp. rate 20, height 6' (1.829 m), weight 85.2 kg, SpO2 96 %.    Vent Mode: PSV;CPAP FiO2 (%):  [30 %] 30 % PEEP:  [5 cmH20] 5 cmH20 Pressure Support:  [5 cmH20] 5 cmH20   Intake/Output Summary (Last 24 hours) at 03/01/2019 0848 Last data filed at 03/01/2019 0500 Gross per 24 hour  Intake 1052.46 ml  Output 3310 ml  Net -2257.54 ml   Filed Weights   02/27/19 0500 02/28/19 0329 03/01/19 0242  Weight: 85.3 kg 85.2 kg 85.2 kg     General: Well-nourished well-developed male no acute distress HEENT: Loose fitting dentures in place making speech difficult to understand Neuro: Moves all extremities follows commands able to communicate CV: s1s2 rrr, no m/r/g PULM: even/non-labored, lungs bilaterally decreased breath sounds in the bases OF:BPZW, non-tender, bsx4 active  Extremities: warm/dry, 2+ edema  Skin: no rashes or lesions    Assessment & Plan:  ASSESSMENT / PLAN:  RESPIRATORY A:  Acute respiratory failure due to intentional OD   P:   Extubated 02/28/2019 No further respiratory distress Continue pulmonary toilet Transition to triads care    PSYCHIATRY A:   Baseline: pt is a Schizophrenic w/ mood instability with a history of previous attempts -  Pt was also Rx Zyprexa, and Seroquel.   Admit 02/26/2019 - interntional OD with note "DNR" on him. U Tox positive for benzo and opioids. >  third party reporting of empty bottles of Norco, and Xanax.  P:   Currently has a sitter He needs a psychiatric consult Stop Precedex Resume home antipsychotic medications Intensify care   NEUROLOGIC A:   Acute encephalopathy due to overdose, resolving P:   Continue fluid Supportive care   Fever-st Chest x-ray 6/22 obtained and reviewed which shows mild right basal infiltrate, Plan Started on Unasyn 02/28/2019 Continue to monitor for signs and symptoms  of infection     Best practice:  Diet: Clear liquid diet and advance as tolerated Pain/Anxiety/Delirium protocol (if indicated): Resume home antipsychotic VAP protocol (if indicated): yes DVT prophylaxis: Lovenox [place] w/ SCDs GI prophylaxis: PPI Glucose control: ISS if BG exceeds 180mg /dl Mobility: bedrest Code Status: Full Family Communication: 6/20 - Mother, son and sister listed on Facesheet spoke to son who states that his Dad texted him on 6/18. .  Patient lives alone and family is not aware of behavioral health follow up.     Disposition: ICU   Richardson Landry  ACNP Maryanna Shape PCCM Pager [phone] till 1 pm If no answer page [phone] 03/01/2019, 8:49 AM   .

## 2019-03-02 DIAGNOSIS — F1910 Other psychoactive substance abuse, uncomplicated: Secondary | ICD-10-CM

## 2019-03-02 MED ORDER — AMOXICILLIN-POT CLAVULANATE 875-125 MG PO TABS
1.0000 | ORAL_TABLET | Freq: Two times a day (BID) | ORAL | Status: DC
  Administered 2019-03-02 – 2019-03-04 (×5): 1 via ORAL
  Filled 2019-03-02 (×5): qty 1

## 2019-03-02 MED ORDER — LORAZEPAM 1 MG PO TABS
1.0000 mg | ORAL_TABLET | Freq: Four times a day (QID) | ORAL | Status: DC | PRN
  Administered 2019-03-02 – 2019-03-04 (×5): 1 mg via ORAL
  Filled 2019-03-02 (×5): qty 1

## 2019-03-02 MED ORDER — HALOPERIDOL LACTATE 5 MG/ML IJ SOLN
INTRAMUSCULAR | Status: AC
  Filled 2019-03-02: qty 1

## 2019-03-02 MED ORDER — ENSURE ENLIVE PO LIQD: 237.0000 mL | Freq: Two times a day (BID) | ORAL | Status: DC

## 2019-03-02 MED ORDER — OLANZAPINE 2.5 MG PO TABS
2.5000 mg | ORAL_TABLET | Freq: Every day | ORAL | Status: DC
  Administered 2019-03-02 – 2019-03-04 (×3): 2.5 mg via ORAL
  Filled 2019-03-02 (×3): qty 1

## 2019-03-02 MED ORDER — OLANZAPINE 10 MG PO TABS
20.0000 mg | ORAL_TABLET | Freq: Every day | ORAL | Status: DC
  Administered 2019-03-02 – 2019-03-03 (×2): 20 mg via ORAL
  Filled 2019-03-02 (×2): qty 2

## 2019-03-02 MED ORDER — OLANZAPINE 10 MG IM SOLR
10.0000 mg | Freq: Two times a day (BID) | INTRAMUSCULAR | Status: AC | PRN
  Administered 2019-03-03: 10 mg via INTRAMUSCULAR
  Filled 2019-03-02: qty 10

## 2019-03-02 NOTE — TOC Initial Note (Signed)
Transition of Care [facility]) - Initial/Assessment Note    Patient Details  Name: [name] MRN: [id number] Date of Birth: 03-03-1971  Transition of Care [facility]) CM/SW Contact:    Eileen Stanford, LCSW Phone Number: 03/02/2019, 11:41 AM  Clinical Narrative:  Pt is violent therefore CSW did not meet with pt at bedside. Pt needs [facility] Placement. Pt is IVC'd. CSW has sent referral to 1) [facility], 2) [facility], 3) [facility], awaiting responses.                Expected Discharge Plan: Psychiatric Hospital Barriers to Discharge: Psych Bed not available   Patient Goals and CMS Choice        Expected Discharge Plan and Services Expected Discharge Plan: [facility] In-house Referral: NA     Living arrangements for the past 2 months: Apartment                           HH Arranged: NA          Prior Living Arrangements/Services Living arrangements for the past 2 months: Apartment Lives with:: Self Patient language and need for interpreter reviewed:: Yes Do you feel safe going back to the place where you live?: No      Need for Family Participation in Patient Care: Yes (Comment) Care giver support system in place?: Yes (comment)   Criminal Activity/Legal Involvement Pertinent to Current Situation/Hospitalization: No - Comment as needed  Activities of Daily Living Home Assistive Devices/Equipment: None ADL Screening (condition at time of admission) Patient's cognitive ability adequate to safely complete daily activities?: Yes Is the patient deaf or have difficulty hearing?: No Does the patient have difficulty seeing, even when wearing glasses/contacts?: No Does the patient have difficulty concentrating, remembering, or making decisions?: No Patient able to express need for assistance with ADLs?: Yes Does the patient have difficulty dressing or bathing?: No Independently performs ADLs?: Yes (appropriate for developmental age) Does the patient have  difficulty walking or climbing stairs?: No Weakness of Legs: None Weakness of Arms/Hands: None  Permission Sought/Granted Permission sought to share information with : Psychiatrist                Emotional Assessment Appearance:: Appears stated age Attitude/Demeanor/Rapport: Unable to Assess Affect (typically observed): Unable to Assess Orientation: : Oriented to Self, Oriented to Place, Oriented to  Time, Oriented to Situation Alcohol / Substance Use: Illicit Drugs Psych Involvement: Yes (comment)(seeking for [facility] placement)  Admission diagnosis:  Polysubstance abuse ([place]) [F19.10] Acute respiratory failure with hypoxia ([place]) [J96.01] Intentional drug overdose, initial encounter [facility]) [T50.902A] Patient Active Problem List   Diagnosis Date Noted  . Acute respiratory failure with hypoxia ([place])   . Polysubstance overdose 02/26/2019  . Laceration of Achilles tendon, right, initial encounter   . Shock ([place]) 08/28/2018  . Suicide attempt ([place])   . Benzodiazepine (tranquilizer) overdose, intentional self-harm, initial encounter ([place]) 06/13/2018  . Overdose of benzodiazepine, intentional self-harm, initial encounter ([place])   . Somnolence   . Schizophrenia, paranoid type ([place]) 08/19/2017  . Hypokalemia 04/28/2014  . Drug overdose 04/27/2014  . Schizoaffective disorder, bipolar type ([place]) 03/02/2014  . Anxiety state 03/02/2014  . Benzodiazepine dependence ([place]) 03/02/2014  . Chest pain 10/11/2013  . EKG abnormalities 10/11/2013  . Chronic back pain    PCP:  Christain Sacramento, MD Pharmacy:   [id number] - [place], [place]  [place] [address] Phone: [phone] Fax: [phone]     Social Determinants of Health (SDOH) Interventions    Readmission Risk Interventions No flowsheet data found.

## 2019-03-02 NOTE — Progress Notes (Signed)
Patient threw telebox across the room and removed himself from the monitor. Patient declined to be placed back on the monitor. All cords and cords were removed from the room. Will continue to monitor and attempt to verbally deescalate.

## 2019-03-02 NOTE — Progress Notes (Signed)
PROGRESS NOTE  [name] SHF:[id number] DOB: December 06, 1970 DOA: 02/26/2019 PCP: Christain Sacramento, MD   LOS: 4 days   Brief Narrative / Interim history: 48 year old male with history of COPD, asthma, schizoaffective disorder with past suicide attempts most recent one in March 2020 was admitted to the hospital on 02/26/2019 for overdose.  He was found down with an empty bottle of hydrocodone and other pill bottles in the vicinity.  The ingestion was not witnessed, family started CPR when EMS arrived he had a pulse and shallow breathing.  Apparently, there was a note written on the skin of the patient's chest that said please DNR.  He recovered with Narcan and was brought to the ED.  He was intubated on arrival and successfully extubated on 02/28/2019.  He was severely agitated requiring Precedex drip which is now been discontinued since 6/23.  Subjective: Patient verbally abusive and threatening this morning, tells me that he needs psychiatric treatment however there are no medications for what he has.  He is telling me that he has been hallucinating due to severe pain but cannot elaborate further pain is.  He also being threatening stating towards me that he can "arrange that I am in similar amounts of pain"  Assessment & Plan: Active Problems:   Polysubstance overdose   Acute respiratory failure with hypoxia (HCC)  Principal Problem Schizophrenia with mood instability with history of prior suicidal attempts -Psychiatry consulted, recommended inpatient psychiatric hospitalization, he is medically cleared.  Consulted Education officer, museum.  Keep on high level of care due to nursing needs -Continue Zyprexa 15 mg nightly, PRN Ativan, Seroquel 100 twice daily as well as as needed Zyprexa IM for significant agitation  Active Problems Acute toxic encephalopathy due to overdose -Resolved  Fever, concern for aspiration pneumonia -Mild right basal infiltrates on chest x-ray 3/22, started on Unasyn on 6/22.   Transition to Augmentin and continue for 3 more days to complete a 5-day course   Scheduled Meds: . Chlorhexidine Gluconate Cloth  6 each Topical Daily  . Chlorhexidine Gluconate Cloth  6 each Topical Q0600  . enoxaparin (LOVENOX) injection  40 mg Subcutaneous Q24H  . mouth rinse  15 mL Mouth Rinse BID  . mupirocin ointment  1 application Nasal BID  . OLANZapine  15 mg Oral QHS  . QUEtiapine  100 mg Oral BID   Continuous Infusions: . lactated ringers 50 mL/hr at 03/01/19 0931   PRN Meds:.ipratropium, LORazepam  DVT prophylaxis: Lovenox Code Status: Full code Family Communication: no family at bedside  Disposition Plan: [facility]   Consultants:   Psychiatry   PCCM  Procedures:   None   Antimicrobials:  Unasyn 6/22 >>6/24  Augmentin 6/24 >>    Objective: Vitals:   03/01/19 2000 03/01/19 2027 03/01/19 2349 03/02/19 0736  BP:  (!) 152/96 (!) 142/97 140/63  Pulse:    85  Resp:   19   Temp:  99.9 F (37.7 C) 99 F (37.2 C) 98.7 F (37.1 C)  TempSrc: Axillary Oral Oral Oral  SpO2:    95%  Weight:      Height:       No intake or output data in the 24 hours ending 03/02/19 1020 Filed Weights   02/27/19 0500 02/28/19 0329 03/01/19 0242  Weight: 85.3 kg 85.2 kg 85.2 kg    Examination:  Constitutional: NAD Eyes: PERRL, lids and conjunctivae normal ENMT: Mucous membranes are moist. Neck: normal, supple  Deferred rest of exam due to  threatening verbal and body language   Data Reviewed: I have independently reviewed following labs and imaging studies   CBC: Recent Labs  Lab 02/26/19 1815 02/26/19 1951 02/27/19 0315 02/28/19 0320 03/01/19 0628  WBC 5.9  --  8.6 9.3 9.4  NEUTROABS  --   --   --  7.1 6.5  HGB 16.9 16.0 16.7 15.7 14.6  HCT 52.0 47.0 51.8 48.1 44.1  MCV 92.0  --  90.6 89.4 88.6  PLT 173  --  146* 149* 409   Basic Metabolic Panel: Recent Labs  Lab 02/26/19 1815 02/26/19 1951 02/27/19 0315 02/28/19 0320 03/01/19 0628   NA 141 143 145 139 142  K 4.8 3.5 4.4 4.1 3.9  CL 108  --  108 109 110  CO2 25  --  29 23 23   GLUCOSE 111*  --  90 140* 108*  BUN 6  --  5* 8 8  CREATININE 0.87  --  0.83 0.89 1.02  CALCIUM 9.2  --  9.4 9.3 9.1  MG  --   --  2.2 2.0 2.0  PHOS  --   --  2.6 2.8 3.1   GFR: Estimated Creatinine Clearance: 96.2 mL/min (by C-G formula based on SCr of 1.02 mg/dL). Liver Function Tests: Recent Labs  Lab 02/26/19 1815  AST 38  ALT 37  ALKPHOS 53  BILITOT 1.4*  PROT 6.6  ALBUMIN 3.9   No results for input(s): LIPASE, AMYLASE in the last 168 hours. No results for input(s): AMMONIA in the last 168 hours. Coagulation Profile: Recent Labs  Lab 02/28/19 0320  INR 1.1   Cardiac Enzymes: Recent Labs  Lab 02/28/19 0320  TROPONINI <0.03   BNP (last 3 results) No results for input(s): PROBNP in the last 8760 hours. HbA1C: No results for input(s): HGBA1C in the last 72 hours. CBG: Recent Labs  Lab 03/01/19 0323 03/01/19 0718 03/01/19 1134 03/01/19 1615 03/01/19 2122  GLUCAP 101* 93 107* 79 151*   Lipid Profile: No results for input(s): CHOL, HDL, LDLCALC, TRIG, CHOLHDL, LDLDIRECT in the last 72 hours. Thyroid Function Tests: No results for input(s): TSH, T4TOTAL, FREET4, T3FREE, THYROIDAB in the last 72 hours. Anemia Panel: No results for input(s): VITAMINB12, FOLATE, FERRITIN, TIBC, IRON, RETICCTPCT in the last 72 hours. Urine analysis:    Component Value Date/Time   COLORURINE YELLOW 02/26/2019 1831   APPEARANCEUR CLEAR 02/26/2019 1831   LABSPEC 1.012 02/26/2019 1831   PHURINE 6.0 02/26/2019 1831   GLUCOSEU NEGATIVE 02/26/2019 1831   HGBUR NEGATIVE 02/26/2019 1831   BILIRUBINUR NEGATIVE 02/26/2019 1831   KETONESUR NEGATIVE 02/26/2019 1831   PROTEINUR NEGATIVE 02/26/2019 1831   UROBILINOGEN 0.2 01/22/2011 0350   NITRITE NEGATIVE 02/26/2019 1831   LEUKOCYTESUR NEGATIVE 02/26/2019 1831   Sepsis Labs: Invalid input(s): PROCALCITONIN, LACTICIDVEN  Recent  Results (from the past 240 hour(s))  SARS Coronavirus 2 (CEPHEID - Performed in [facility] hospital lab), Hosp Order     Status: None   Collection Time: 02/26/19  6:09 PM   Specimen: Nasopharyngeal Swab  Result Value Ref Range Status   SARS Coronavirus 2 NEGATIVE NEGATIVE Final    Comment: (NOTE) If result is NEGATIVE SARS-CoV-2 target nucleic acids are NOT DETECTED. The SARS-CoV-2 RNA is generally detectable in upper and lower  respiratory specimens during the acute phase of infection. The lowest  concentration of SARS-CoV-2 viral copies this assay can detect is 250  copies / mL. A negative result does not preclude SARS-CoV-2 infection  and should  not be used as the sole basis for treatment or other  patient management decisions.  A negative result may occur with  improper specimen collection / handling, submission of specimen other  than nasopharyngeal swab, presence of viral mutation(s) within the  areas targeted by this assay, and inadequate number of viral copies  (<250 copies / mL). A negative result must be combined with clinical  observations, patient history, and epidemiological information. If result is POSITIVE SARS-CoV-2 target nucleic acids are DETECTED. The SARS-CoV-2 RNA is generally detectable in upper and lower  respiratory specimens dur ing the acute phase of infection.  Positive  results are indicative of active infection with SARS-CoV-2.  Clinical  correlation with patient history and other diagnostic information is  necessary to determine patient infection status.  Positive results do  not rule out bacterial infection or co-infection with other viruses. If result is PRESUMPTIVE POSTIVE SARS-CoV-2 nucleic acids MAY BE PRESENT.   A presumptive positive result was obtained on the submitted specimen  and confirmed on repeat testing.  While 2019 novel coronavirus  (SARS-CoV-2) nucleic acids may be present in the submitted sample  additional confirmatory testing may  be necessary for epidemiological  and / or clinical management purposes  to differentiate between  SARS-CoV-2 and other Sarbecovirus currently known to infect humans.  If clinically indicated additional testing with an alternate test  methodology [phone]) is advised. The SARS-CoV-2 RNA is generally  detectable in upper and lower respiratory sp ecimens during the acute  phase of infection. The expected result is Negative. Fact Sheet for Patients:  StrictlyIdeas.no Fact Sheet for Healthcare Providers: BankingDealers.co.za This test is not yet approved or cleared by the [place] FDA and has been authorized for detection and/or diagnosis of SARS-CoV-2 by FDA under an Emergency Use Authorization (EUA).  This EUA will remain in effect (meaning this test can be used) for the duration of the COVID-19 declaration under Section 564(b)(1) of the Act, 21 U.S.C. section 360bbb-3(b)(1), unless the authorization is terminated or revoked sooner. Performed at [facility], [place] [address], [place], [place] [address]   MRSA PCR Screening     Status: Abnormal   Collection Time: 02/26/19 11:34 PM   Specimen: Nasal Mucosa; Nasopharyngeal  Result Value Ref Range Status   MRSA by PCR POSITIVE (A) NEGATIVE Final    Comment: CRITICAL RESULT CALLED TO, READ BACK BY AND VERIFIED WITH: RNBethann Humble [id number] @0547  THANEY Performed at [facility] [address] [place], [place] [address]   Culture, blood (routine x 2)     Status: None (Preliminary result)   Collection Time: 02/27/19  8:56 PM   Specimen: BLOOD LEFT HAND  Result Value Ref Range Status   Specimen Description BLOOD LEFT HAND  Final   Special Requests   Final    BOTTLES DRAWN AEROBIC AND ANAEROBIC Blood Culture adequate volume   Culture   Final    NO GROWTH 2 DAYS Performed at [facility], [place] [address] [place], [place] [address]    Report Status PENDING  Incomplete   Culture, blood (routine x 2)     Status: None (Preliminary result)   Collection Time: 02/27/19  9:00 PM   Specimen: BLOOD RIGHT HAND  Result Value Ref Range Status   Specimen Description BLOOD RIGHT HAND  Final   Special Requests   Final    BOTTLES DRAWN AEROBIC AND ANAEROBIC Blood Culture adequate volume   Culture   Final  NO GROWTH 2 DAYS Performed at [facility], [place] [address], [place], [place] [address]    Report Status PENDING  Incomplete      Radiology Studies: Dg Chest Port 1 View  Result Date: 03/01/2019 CLINICAL DATA:  Pneumonitis, history of overdose EXAM: PORTABLE CHEST 1 VIEW COMPARISON:  02/28/2019 FINDINGS: Interval endotracheal and esophagogastric extubation. There is bandlike atelectasis of the left midlung. No other acute appearing airspace opacity. The heart and mediastinum are unremarkable. IMPRESSION: Interval endotracheal and esophagogastric extubation. There is bandlike atelectasis of the left midlung. No other acute appearing airspace opacity. The heart and mediastinum are unremarkable. Electronically Signed   By: Eddie Candle M.D.   On: 03/01/2019 20:52   Dg Chest Port 1 View  Result Date: 02/28/2019 CLINICAL DATA:  Respiratory distress. EXAM: PORTABLE CHEST 1 VIEW COMPARISON:  Radiograph of February 26, 2019. FINDINGS: The heart size and mediastinal contours are within normal limits. No pneumothorax is noted. Endotracheal and nasogastric tubes are unchanged in position. Left lung is clear. Mild right basilar subsegmental atelectasis or infiltrate is noted with small pleural effusion. The visualized skeletal structures are unremarkable. IMPRESSION: Stable support apparatus. Mild right basilar subsegmental atelectasis or infiltrate is noted with small pleural effusion. Electronically Signed   By: Marijo Conception M.D.   On: 02/28/2019 10:35    Marzetta Board, MD, PhD [facility]  Contact via  [email or web address]  [place] P: [phone]  F:  [phone]

## 2019-03-02 NOTE — Consult Note (Signed)
Patient is seen and examined. Patient is a 48 YO male with a reported past psychiatric history schizoaffective disorder as well as polysubstance use disorder who is seen in follow up. Apparently patient was irritable and aggressive this am. Serach for a bed continues. His zyprexa was increased yesterday to 15 mg po q hs. He denies SI this am but continues to have auditory hallucinations. His BP is slightly elevated but afebrile and pulse is stable. CMP this am is basically WNL.CBC is as well WNL. Blood cultures remain no growth at this point.  MSE positive for +AH.  A/P 1) schizoaffective disorder, 2) Intentional OD, 3) BZP dependence  Patient continues to have behavioral issues as well as +AH. He denies SI. Serach for psych bed continues. We will add a daytime dose of zyprexa to decrease any daytime issues. Patient stated "I didn't sleep last night", but no nursing records in chart. We will also increase zyprexa to 20 mg po q hs. We will also change his lorazepam to 1 mg po q 6 hors as we wean him off that medication.  1) increase zyprexa to 20 mg po q hs and add daytime 2.5 mg po q day with prn also for agitation 2) change lorazepam to q 6 hours prn. 3) we will continue to follow with you, hopefully a bed will become available soon.

## 2019-03-02 NOTE — Progress Notes (Signed)
eLink Physician-Brief Progress Note Patient Name: Mali A Isensee DOB: 1971/06/02 MRN: [id number]   Date of Service  03/02/2019  HPI/Events of Note  Decompensated schizophrenia with overt psychosis and extreme agitation  eICU Interventions  Zyprexa 10 mg im x 1        Ivylynn Hoppes U Maddalyn Lutze 03/02/2019, 3:00 AM

## 2019-03-02 NOTE — Progress Notes (Signed)
PT Cancellation Note  Patient Details Name: Mali A Lovvorn MRN: [id number] DOB: 06/09/1971   Cancelled Treatment:    Reason Eval/Treat Not Completed: Other (comment)(Nurse states pt not appropriate for PT. Will sign off. )   Denice Paradise 03/02/2019, 1:50 PM Tiwanna Tuch,PT Acute Rehabilitation Services Pager:  [phone]  Office:  [phone]

## 2019-03-02 NOTE — Progress Notes (Signed)
Agitation and aggression from patient have continued to increase during shift. Patient continues to attempt to leave his room. Patient has stated multiple threats to harm or kill staff, people of the general public, and himself. IVC paperwork has been filed and the patient has been served via [employer]. Social work will attempt to find placement tomorrow.

## 2019-03-03 DIAGNOSIS — R440 Auditory hallucinations: Secondary | ICD-10-CM

## 2019-03-03 LAB — GLUCOSE, CAPILLARY
Glucose-Capillary: 114 mg/dL — ABNORMAL HIGH (ref 70–99)
Glucose-Capillary: 94 mg/dL (ref 70–99)

## 2019-03-03 MED ORDER — ACETAMINOPHEN 325 MG PO TABS
650.0000 mg | ORAL_TABLET | Freq: Once | ORAL | Status: AC
  Administered 2019-03-03: 650 mg via ORAL
  Filled 2019-03-03: qty 2

## 2019-03-03 MED ORDER — LORAZEPAM 2 MG/ML IJ SOLN: 1.0000 mg | INTRAMUSCULAR | Status: DC | PRN

## 2019-03-03 MED ORDER — ZIPRASIDONE MESYLATE 20 MG IM SOLR
20.0000 mg | Freq: Once | INTRAMUSCULAR | Status: DC | PRN
  Filled 2019-03-03: qty 20

## 2019-03-03 NOTE — Progress Notes (Signed)
Pt continues to exhibit auditory and visual hallucinations.  Pt with intermittent restless / agitation earlier in the shift.  PRN lorazepam and scheduled night time meds  given with relief. To continue to  closely monitor pt.

## 2019-03-03 NOTE — Progress Notes (Signed)
[facility] called to say they will look over referral and put pt on wait list. Once bed is available they will place pt. 

## 2019-03-03 NOTE — Progress Notes (Signed)
Pt restless, increased auditory and visual hallucinations verbalized as shift progressed. At approx 0425, pt stated he saw his wife and went around the sitter and exited the room. When this RN asked him to re-enter the room, pt began to run down hall and entered another patient room. Secondary to the risk posed to the other patient, this RN applied sufficient physical force to remove the pt and escorted him back to 2h14.

## 2019-03-03 NOTE — Consult Note (Signed)
  Patient is seen and examined. Patient is a 48 YO male with PPHx significant for schizoaffective disorder, polysubstance abuse and recent intentional OD. He continues to have intermittant auditory hallucinations although somewhat improved. Medically cleared.  He remains on zyprexa 2.5 mg po q day and 20 mg po q hs. VSS and afebrile. We will arrange for transfer when psych bed is available.

## 2019-03-03 NOTE — Progress Notes (Signed)
[name] is a 48 y.o. male patient admitted from [facility] awake, alert - oriented  X 4 - no acute distress noted.  Orientation to room, and floor completed with information packet given to patient/family.  Patient declined safety video at this time.  Admission INP armband ID verified with patient/family, and in place. Safety sitter at bedside. Outlets taped. Trash cans, gloves and telephone removed from room.      Will cont to eval and treat per MD orders.  Tama High, RN 03/03/2019 5:47 PM

## 2019-03-03 NOTE — Progress Notes (Signed)
No changes today, discussed with the nurse, vital signs are stable and he has recently refused blood work however most recent labs are unremarkable.  He is medically stable and ready for discharge to psychiatric facility.  He still has active psychiatric issues including auditory hallucinations and intermittent aggressive behavior.  Overnight try to elope and ran out of the room and was found in another patient's room  Brayli Klingbeil M. Cruzita Lederer, MD, PhD [facility]  Contact via  [email or web address]  [place] P: [phone]  F: [phone]

## 2019-03-03 NOTE — Progress Notes (Signed)
Pt's  Mother, Kela Millin patricia, was updated on pt's progress . All questions were answered

## 2019-03-03 NOTE — TOC Progression Note (Signed)
Transition of Care [facility]) - Progression Note    Patient Details  Name: [name] MRN: [id number] Date of Birth: Mar 27, 1971  Transition of Care [facility]) CM/SW [place], LCSW Phone Number: 03/03/2019, 4:35 PM  Clinical Narrative:   Pt has been accepted at [facility]. Pt will transfer tomorrow. CSW to follow up with RN on unit in AM.    Expected Discharge Plan: Psychiatric Hospital Barriers to Discharge: Psych Bed not available  Expected Discharge Plan and Services Expected Discharge Plan: [facility] In-house Referral: NA     Living arrangements for the past 2 months: Apartment                           HH Arranged: NA           Social Determinants of Health (SDOH) Interventions    Readmission Risk Interventions No flowsheet data found.

## 2019-03-04 LAB — CULTURE, BLOOD (ROUTINE X 2)
Culture: NO GROWTH
Culture: NO GROWTH
Special Requests: ADEQUATE
Special Requests: ADEQUATE

## 2019-03-04 MED ORDER — TRAMADOL HCL 50 MG PO TABS
100.0000 mg | ORAL_TABLET | Freq: Once | ORAL | Status: DC
  Filled 2019-03-04: qty 2

## 2019-03-04 NOTE — TOC Transition Note (Signed)
Transition of Care [facility]) - CM/SW Discharge Note   Patient Details  Name: [name] MRN: [id number] Date of Birth: 02-Jun-1971  Transition of Care [facility]) CM/SW Contact:  Benard Halsted, LCSW Phone Number: 03/04/2019, 9:21 AM   Clinical Narrative:    Patient will DC to: [facility] Anticipated DC date: 03/04/19 Family notified: Mother Transport by: Brandon Melnick due to [facility]   Per MD patient ready for DC to [facility]. RN, patient, patient's family, and facility notified of DC. Discharge Summary sent to facility. RN to call report prior to discharge([phone]). Please send IVC paperwork with patient. Sheriff transport arranged for next available.   CSW will sign off for now as social work intervention is no longer needed. Please consult [place] again if new needs arise.  Cedric Fishman, LCSW Clinical Social Worker [phone]   Final next level of care: Psychiatric Hospital Barriers to Discharge: No Barriers Identified   Patient Goals and CMS Choice        Discharge Placement                Patient to be transferred to facility by: [facility] due to IVC   Patient and family notified of of transfer: 03/04/19  Discharge Plan and Services In-house Referral: NA                        HH Arranged: NA          Social Determinants of Health (SDOH) Interventions     Readmission Risk Interventions No flowsheet data found.

## 2019-03-04 NOTE — Progress Notes (Signed)
[name] to be D/C'd to [facility] per MD order. Discussed with the patient and all questions fully answered.  Pt had abrasion to right elbow and rash to right hip otherwise skin clean dry and intact.  An After Visit Summary was printed and given  Patient escorted via [place], and D/C to [facility]. Report called. Jordan Hawks A Florentine Diekman  03/04/2019 11:00 AM

## 2019-03-04 NOTE — Discharge Summary (Signed)
Physician Discharge Summary  [name] KNL:[id number] DOB: 1970/10/26 DOA: 02/26/2019  PCP: Christain [place], MD  Admit date: 02/26/2019 Discharge date: 03/04/2019  Admitted From: home Disposition:  Psychiatric facility   Recommendations for Outpatient Follow-up:  1. Follow up with PCP in 1-2 weeks following discharge from psychiatric facility   Home Health: none Equipment/Devices: none  Discharge Condition: stable CODE STATUS: Full code Diet recommendation: regular  HPI: Per admitting MD, 48 yo M w/ PMHx COPD, Asthma, Schizoaffective w/ past suicide attempts the most recent documented in EMR was n March 2020. Per EMS they were called to this patient's home one week ago for suicidal ideation. Per Alta Corning note: Pt's son found him lying on the floor at 1715 with an empty bottle of hydrocodone and other pill bottles in the vicinity. Last known normal was before 1615. The ingestion was not witnessed. Family thought pt was initially in cardiac arrest and started CPR but on arrival of EMD pt was found to have shallow breathing and a weak pulse.  There was a note written on the skin of the patient's chest that said "please DNR." EMS administered intranasal narcan which improved pt's resp rate.  Per EDP note: Patient was prescribed Norco 10 mg and Xanax 1 mg, also prescribed Zyprexa, and Seroquel. It can not be confirmed at this time how much or what pt took exactly.  Third party report: "Patient last had more than 100 Xanax tablets prescribed, dispensed a few weeks ago, had around 75 Norco's filled a few weeks ago, all pill bottles empty per EMS and family. Attempting to actually obtain pill bottles from family"  Hospital Course: Schizophrenia with mood instability with history of prior suicidal attempts -on admission he was intubated on arrival and successfully extubated on 02/28/2019.  He was severely agitated requiring Precedex drip which is now been discontinued since 6/23. Psychiatry consulted,  recommended inpatient psychiatric hospitalization, he is medically cleared.  Consulted Education officer, museum.  -Continue Zyprexa, Seroquel 100 twice daily as well as as needed Zyprexa IM for significant agitation  Active Problems Acute toxic encephalopathy due to overdose -Resolved  Fever, concern for aspiration pneumonia -Mild right basal infiltrates on chest x-ray 3/22, started on Unasyn on 6/22. Completed course while hospitalized. Afebrile.   Discharge Diagnoses:  Active Problems:   Polysubstance overdose   Acute respiratory failure with hypoxia [facility])     Discharge Instructions   Allergies as of 03/04/2019      Reactions   Amoxicillin Diarrhea   Has patient had a PCN reaction causing immediate rash, facial/tongue/throat swelling, SOB or lightheadedness with hypotension: No Has patient had a PCN reaction causing severe rash involving mucus membranes or skin necrosis: No Has patient had a PCN reaction that required hospitalization: No Has patient had a PCN reaction occurring within the last 10 years: No If all of the above answers are "NO", then may proceed with Cephalosporin use.   Dextromethorphan-guaifenesin Other (See Comments)   unknown   Haloperidol Other (See Comments)   unspecified   Meloxicam Other (See Comments)   unspecified   Nsaids Other (See Comments)   Rectal bleeding   Prednisone Other (See Comments)   Psychotic episodes    Quetiapine Other (See Comments)   Psychosis with high dose (600mg )   Sudafed [pseudoephedrine Hcl] Other (See Comments)   Hallucinations   Ziprasidone Other (See Comments)   unspecified      Medication List    STOP taking these medications   ALPRAZolam 1 MG  tablet Commonly known as: XANAX   HYDROcodone-acetaminophen 10-325 MG tablet Commonly known as: NORCO   Oxycodone HCl 10 MG Tabs     TAKE these medications   acetaminophen 325 MG tablet Commonly known as: TYLENOL Take 2 tablets (650 mg total) by mouth every 6 (six) hours  as needed for mild pain, fever or headache.   diclofenac sodium 1 % Gel Commonly known as: VOLTAREN Apply 4 g topically 4 (four) times daily as needed.   doxepin 50 MG capsule Commonly known as: SINEQUAN Take 50 mg by mouth at bedtime.   mupirocin ointment 2 % Commonly known as: BACTROBAN Place 1 application into the nose 2 (two) times daily.   OLANZapine 10 MG tablet Commonly known as: ZYPREXA Take 20 mg by mouth at bedtime.   perphenazine 4 MG tablet Commonly known as: TRILAFON Take 1 tablet (4 mg total) by mouth 2 (two) times daily.   QUEtiapine 100 MG tablet Commonly known as: SEROQUEL Take 100 mg by mouth 2 (two) times daily. What changed: Another medication with the same name was removed. Continue taking this medication, and follow the directions you see here.        Consultations:  Psychiatry   Critical care  Procedures/Studies:  Ct Head Wo Contrast  Result Date: 02/26/2019 CLINICAL DATA:  Altered level of consciousness (LOC), unexplained EXAM: CT HEAD WITHOUT CONTRAST TECHNIQUE: Contiguous axial images were obtained from the base of the skull through the vertex without intravenous contrast. COMPARISON:  Head CT 08/28/2018 FINDINGS: Brain: No intracranial hemorrhage, mass effect, or midline shift. No hydrocephalus. The basilar cisterns are patent. No evidence of territorial infarct or acute ischemia. Tiny remote lacunar infarct in right basal ganglia. No extra-axial or intracranial fluid collection. Vascular: No hyperdense vessel. Skull: No fracture or focal lesion. Sinuses/Orbits: Scattered paranasal sinus mucosal thickening without fluid level. Mastoid air cells are clear. Endotracheal and orogastric tubes partially visualized. The visualized orbits are unremarkable. Other: None. IMPRESSION: No acute intracranial abnormality. Electronically Signed   By: [place] Rake M.D.   On: 02/26/2019 19:23   Dg Chest Port 1 View  Result Date: 03/01/2019 CLINICAL DATA:   Pneumonitis, history of overdose EXAM: PORTABLE CHEST 1 VIEW COMPARISON:  02/28/2019 FINDINGS: Interval endotracheal and esophagogastric extubation. There is bandlike atelectasis of the left midlung. No other acute appearing airspace opacity. The heart and mediastinum are unremarkable. IMPRESSION: Interval endotracheal and esophagogastric extubation. There is bandlike atelectasis of the left midlung. No other acute appearing airspace opacity. The heart and mediastinum are unremarkable. Electronically Signed   By: Eddie Candle M.D.   On: 03/01/2019 20:52   Dg Chest Port 1 View  Result Date: 02/28/2019 CLINICAL DATA:  Respiratory distress. EXAM: PORTABLE CHEST 1 VIEW COMPARISON:  Radiograph of February 26, 2019. FINDINGS: The heart size and mediastinal contours are within normal limits. No pneumothorax is noted. Endotracheal and nasogastric tubes are unchanged in position. Left lung is clear. Mild right basilar subsegmental atelectasis or infiltrate is noted with small pleural effusion. The visualized skeletal structures are unremarkable. IMPRESSION: Stable support apparatus. Mild right basilar subsegmental atelectasis or infiltrate is noted with small pleural effusion. Electronically Signed   By: Marijo Conception M.D.   On: 02/28/2019 10:35   Dg Chest Portable 1 View  Result Date: 02/26/2019 CLINICAL DATA:  Status post intubation. EXAM: PORTABLE CHEST 1 VIEW COMPARISON:  08/31/2018 FINDINGS: Status post intubation. The endotracheal tube in satisfactory position. Enteric catheter overlies the stomach. Cardiomediastinal silhouette is normal. Mediastinal contours appear  intact. There is no evidence of focal airspace consolidation, pleural effusion or pneumothorax. Osseous structures are without acute abnormality. Soft tissues are grossly normal. IMPRESSION: No active disease. Electronically Signed   By: Fidela Salisbury M.D.   On: 02/26/2019 19:02      Subjective: No distress, sleeping  Discharge Exam: BP  (!) 138/103 (BP Location: Right Arm)    Pulse 72    Temp 98.4 F (36.9 C) (Oral)    Resp 18    Ht 6' (1.829 m)    Wt 85.2 kg    SpO2 97%    BMI 25.47 kg/m   General:  not in acute distress    The results of significant diagnostics from this hospitalization (including imaging, microbiology, ancillary and laboratory) are listed below for reference.     Microbiology: Recent Results (from the past 240 hour(s))  SARS Coronavirus 2 (CEPHEID - Performed in [place] hospital lab), Hosp Order     Status: None   Collection Time: 02/26/19  6:09 PM   Specimen: Nasopharyngeal Swab  Result Value Ref Range Status   SARS Coronavirus 2 NEGATIVE NEGATIVE Final    Comment: (NOTE) If result is NEGATIVE SARS-CoV-2 target nucleic acids are NOT DETECTED. The SARS-CoV-2 RNA is generally detectable in upper and lower  respiratory specimens during the acute phase of infection. The lowest  concentration of SARS-CoV-2 viral copies this assay can detect is 250  copies / mL. A negative result does not preclude SARS-CoV-2 infection  and should not be used as the sole basis for treatment or other  patient management decisions.  A negative result may occur with  improper specimen collection / handling, submission of specimen other  than nasopharyngeal swab, presence of viral mutation(s) within the  areas targeted by this assay, and inadequate number of viral copies  (<250 copies / mL). A negative result must be combined with clinical  observations, patient history, and epidemiological information. If result is POSITIVE SARS-CoV-2 target nucleic acids are DETECTED. The SARS-CoV-2 RNA is generally detectable in upper and lower  respiratory specimens dur ing the acute phase of infection.  Positive  results are indicative of active infection with SARS-CoV-2.  Clinical  correlation with patient history and other diagnostic information is  necessary to determine patient infection status.  Positive results do   not rule out bacterial infection or co-infection with other viruses. If result is PRESUMPTIVE POSTIVE SARS-CoV-2 nucleic acids MAY BE PRESENT.   A presumptive positive result was obtained on the submitted specimen  and confirmed on repeat testing.  While 2019 novel coronavirus  (SARS-CoV-2) nucleic acids may be present in the submitted sample  additional confirmatory testing may be necessary for epidemiological  and / or clinical management purposes  to differentiate between  SARS-CoV-2 and other Sarbecovirus currently known to infect humans.  If clinically indicated additional testing with an alternate test  methodology [phone]) is advised. The SARS-CoV-2 RNA is generally  detectable in upper and lower respiratory sp ecimens during the acute  phase of infection. The expected result is Negative. Fact Sheet for Patients:  StrictlyIdeas.no Fact Sheet for Healthcare Providers: BankingDealers.co.za This test is not yet approved or cleared by the [place] FDA and has been authorized for detection and/or diagnosis of SARS-CoV-2 by FDA under an Emergency Use Authorization (EUA).  This EUA will remain in effect (meaning this test can be used) for the duration of the COVID-19 declaration under Section 564(b)(1) of the Act, 21 U.S.C. section 360bbb-3(b)(1), unless the authorization  is terminated or revoked sooner. Performed at [facility], [place] [address], [place], [place] [address]   MRSA PCR Screening     Status: Abnormal   Collection Time: 02/26/19 11:34 PM   Specimen: Nasal Mucosa; Nasopharyngeal  Result Value Ref Range Status   MRSA by PCR POSITIVE (A) NEGATIVE Final    Comment: CRITICAL RESULT CALLED TO, READ BACK BY AND VERIFIED WITH: RNBethann Humble [id number] @0547  THANEY Performed at [facility] [address] [place], [place] [address]   Culture, blood (routine x 2)     Status: None (Preliminary result)    Collection Time: 02/27/19  8:56 PM   Specimen: BLOOD LEFT HAND  Result Value Ref Range Status   Specimen Description BLOOD LEFT HAND  Final   Special Requests   Final    BOTTLES DRAWN AEROBIC AND ANAEROBIC Blood Culture adequate volume   Culture   Final    NO GROWTH 4 DAYS Performed at [facility], [place] [address], [place], [place] [address]    Report Status PENDING  Incomplete  Culture, blood (routine x 2)     Status: None (Preliminary result)   Collection Time: 02/27/19  9:00 PM   Specimen: BLOOD RIGHT HAND  Result Value Ref Range Status   Specimen Description BLOOD RIGHT HAND  Final   Special Requests   Final    BOTTLES DRAWN AEROBIC AND ANAEROBIC Blood Culture adequate volume   Culture   Final    NO GROWTH 4 DAYS Performed at [facility], [place] [address] [place], [place] [address]    Report Status PENDING  Incomplete     Labs: BNP (last 3 results) No results for input(s): BNP in the last 8760 hours. Basic Metabolic Panel: Recent Labs  Lab 02/26/19 1815 02/26/19 1951 02/27/19 0315 02/28/19 0320 03/01/19 0628  NA 141 143 145 139 142  K 4.8 3.5 4.4 4.1 3.9  CL 108  --  108 109 110  CO2 25  --  29 23 23   GLUCOSE 111*  --  90 140* 108*  BUN 6  --  5* 8 8  CREATININE 0.87  --  0.83 0.89 1.02  CALCIUM 9.2  --  9.4 9.3 9.1  MG  --   --  2.2 2.0 2.0  PHOS  --   --  2.6 2.8 3.1   Liver Function Tests: Recent Labs  Lab 02/26/19 1815  AST 38  ALT 37  ALKPHOS 53  BILITOT 1.4*  PROT 6.6  ALBUMIN 3.9   No results for input(s): LIPASE, AMYLASE in the last 168 hours. No results for input(s): AMMONIA in the last 168 hours. CBC: Recent Labs  Lab 02/26/19 1815 02/26/19 1951 02/27/19 0315 02/28/19 0320 03/01/19 0628  WBC 5.9  --  8.6 9.3 9.4  NEUTROABS  --   --   --  7.1 6.5  HGB 16.9 16.0 16.7 15.7 14.6  HCT 52.0 47.0 51.8 48.1 44.1  MCV 92.0  --  90.6 89.4 88.6  PLT 173  --  146* 149* 165   Cardiac Enzymes: Recent Labs  Lab 02/28/19 0320   TROPONINI <0.03   BNP: Invalid input(s): POCBNP CBG: Recent Labs  Lab 03/01/19 1134 03/01/19 1615 03/01/19 2122 03/03/19 0720 03/03/19 1149  GLUCAP 107* 79 151* 114* 94   D-Dimer No results for input(s): DDIMER in the last 72 hours. Hgb A1c No results for input(s): HGBA1C in the last 72 hours. Lipid Profile  No results for input(s): CHOL, HDL, LDLCALC, TRIG, CHOLHDL, LDLDIRECT in the last 72 hours. Thyroid function studies No results for input(s): TSH, T4TOTAL, T3FREE, THYROIDAB in the last 72 hours.  Invalid input(s): FREET3 Anemia work up No results for input(s): VITAMINB12, FOLATE, FERRITIN, TIBC, IRON, RETICCTPCT in the last 72 hours. Urinalysis    Component Value Date/Time   COLORURINE YELLOW 02/26/2019 1831   APPEARANCEUR CLEAR 02/26/2019 1831   LABSPEC 1.012 02/26/2019 1831   PHURINE 6.0 02/26/2019 1831   GLUCOSEU NEGATIVE 02/26/2019 1831   HGBUR NEGATIVE 02/26/2019 1831   BILIRUBINUR NEGATIVE 02/26/2019 1831   KETONESUR NEGATIVE 02/26/2019 1831   PROTEINUR NEGATIVE 02/26/2019 1831   UROBILINOGEN 0.2 01/22/2011 0350   NITRITE NEGATIVE 02/26/2019 1831   LEUKOCYTESUR NEGATIVE 02/26/2019 1831   Sepsis Labs Invalid input(s): PROCALCITONIN,  WBC,  LACTICIDVEN  FURTHER DISCHARGE INSTRUCTIONS:   Get Medicines reviewed and adjusted: Please take all your medications with you for your next visit with your Primary MD   Laboratory/radiological data: Please request your Primary MD to go over all hospital tests and procedure/radiological results at the follow up, please ask your Primary MD to get all Hospital records sent to his/her office.   In some cases, they will be blood work, cultures and biopsy results pending at the time of your discharge. Please request that your primary care M.D. goes through all the records of your hospital data and follows up on these results.   Also Note the following: If you experience worsening of your admission symptoms, develop  shortness of breath, life threatening emergency, suicidal or homicidal thoughts you must seek medical attention immediately by calling 911 or calling your MD immediately  if symptoms less severe.   You must read complete instructions/literature along with all the possible adverse reactions/side effects for all the Medicines you take and that have been prescribed to you. Take any new Medicines after you have completely understood and accpet all the possible adverse reactions/side effects.    Do not drive when taking Pain medications or sleeping medications (Benzodaizepines)   Do not take more than prescribed Pain, Sleep and Anxiety Medications. It is not advisable to combine anxiety,sleep and pain medications without talking with your primary care practitioner   Special Instructions: If you have smoked or chewed Tobacco  in the last 2 yrs please stop smoking, stop any regular Alcohol  and or any Recreational drug use.   Wear Seat belts while driving.   Please note: You were cared for by a hospitalist during your hospital stay. Once you are discharged, your primary care physician will handle any further medical issues. Please note that NO REFILLS for any discharge medications will be authorized once you are discharged, as it is imperative that you return to your primary care physician (or establish a relationship with a primary care physician if you do not have one) for your post hospital discharge needs so that they can reassess your need for medications and monitor your lab values.  Time coordinating discharge: 15 minutes  SIGNED:  Marzetta Board, MD, PhD 03/04/2019, 6:49 AM

## 2019-06-23 ENCOUNTER — Ambulatory Visit: Payer: Self-pay

## 2019-06-23 ENCOUNTER — Ambulatory Visit (INDEPENDENT_AMBULATORY_CARE_PROVIDER_SITE_OTHER): Payer: Medicare Other | Admitting: Orthopaedic Surgery

## 2019-06-23 DIAGNOSIS — M545 Low back pain: Secondary | ICD-10-CM

## 2019-06-23 DIAGNOSIS — M542 Cervicalgia: Secondary | ICD-10-CM | POA: Diagnosis not present

## 2019-06-23 DIAGNOSIS — G8929 Other chronic pain: Secondary | ICD-10-CM
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
## 2019-07-20 ENCOUNTER — Telehealth: Payer: Self-pay | Admitting: Orthopaedic Surgery

## 2019-07-20 NOTE — Telephone Encounter (Signed)
Neck and back pain

## 2019-07-20 NOTE — Telephone Encounter (Signed)
Patient called asked what were his diag when he came to see Dr Erlinda Hong. The number to contact patient is [phone] or [phone]

## 2019-07-20 NOTE — Telephone Encounter (Signed)
See message.

## 2019-07-21 NOTE — Telephone Encounter (Signed)
Patient would like a copy of the last OV note. Ready for pick up. Patient aware.

## 2019-08-08 ENCOUNTER — Ambulatory Visit: Payer: Medicare Other | Admitting: Physical Therapy

## 2019-08-22 ENCOUNTER — Ambulatory Visit: Payer: Medicare Other | Admitting: Physical Therapy

## 2019-09-02 ENCOUNTER — Encounter (HOSPITAL_COMMUNITY): Payer: Self-pay | Admitting: Emergency Medicine

## 2019-09-02 ENCOUNTER — Other Ambulatory Visit: Payer: Self-pay

## 2019-09-02 ENCOUNTER — Emergency Department (HOSPITAL_COMMUNITY): Payer: Medicare Other

## 2019-09-02 ENCOUNTER — Emergency Department (HOSPITAL_COMMUNITY)
Admission: EM | Admit: 2019-09-02 | Discharge: 2019-09-02 | Disposition: A | Discharge status: Home / Self Care | Payer: Medicare Other | Attending: Emergency Medicine | Admitting: Emergency Medicine

## 2019-09-02 DIAGNOSIS — F17210 Nicotine dependence, cigarettes, uncomplicated: Secondary | ICD-10-CM | POA: Insufficient documentation

## 2019-09-02 DIAGNOSIS — J449 Chronic obstructive pulmonary disease, unspecified: Secondary | ICD-10-CM | POA: Insufficient documentation

## 2019-09-02 DIAGNOSIS — U071 COVID-19: Secondary | ICD-10-CM | POA: Insufficient documentation

## 2019-09-02 DIAGNOSIS — Z79899 Other long term (current) drug therapy: Secondary | ICD-10-CM | POA: Diagnosis not present

## 2019-09-02 DIAGNOSIS — R0602 Shortness of breath: Secondary | ICD-10-CM | POA: Diagnosis present

## 2019-09-02 DIAGNOSIS — Z20822 Contact with and (suspected) exposure to covid-19: Secondary | ICD-10-CM

## 2019-09-02 LAB — POC SARS CORONAVIRUS 2 AG -  ED: SARS Coronavirus 2 Ag: POSITIVE — AB

## 2019-09-02 LAB — SARS CORONAVIRUS 2 (TAT 6-24 HRS): SARS Coronavirus 2: POSITIVE — AB

## 2019-09-02 MED ORDER — DEXAMETHASONE SODIUM PHOSPHATE 10 MG/ML IJ SOLN
10.0000 mg | Freq: Once | INTRAMUSCULAR | Status: DC
  Filled 2019-09-02: qty 1

## 2019-09-02 MED ORDER — AZITHROMYCIN 250 MG PO TABS: 250.0000 mg | ORAL_TABLET | Freq: Every day | ORAL | 0 refills | Status: DC

## 2019-09-02 MED ORDER — ALBUTEROL SULFATE HFA 108 (90 BASE) MCG/ACT IN AERS: 2.0000 | INHALATION_SPRAY | RESPIRATORY_TRACT | 1 refills | Status: DC | PRN

## 2019-09-02 MED ORDER — ALBUTEROL SULFATE HFA 108 (90 BASE) MCG/ACT IN AERS
6.0000 | INHALATION_SPRAY | RESPIRATORY_TRACT | Status: DC
  Administered 2019-09-02: 6 via RESPIRATORY_TRACT
  Filled 2019-09-02: qty 6.7

## 2019-09-02 NOTE — ED Provider Notes (Signed)
[facility] DEPT Provider Note   CSN: [id number] Arrival date & time: 09/02/19  0116     History Chief Complaint  Patient presents with  . Shortness of Breath    [name] is a 48 y.o. male.  HPI    48 year old male comes in a chief complaint of shortness of breath.  His history of COPD, CHF.  He reports that he has been around his mother and his son who have both had COVID-19, however both of them have overcome the quarantine time.  He comes in with acute episode of shortness of breath and he feels like he is having difficulty getting air in.  He is denying any cough, chest pain, nausea, vomiting, abdominal pain.  Patient denies any substance abuse.  He is sleepy, reports that he took his sedation medication and pain medication prior to coming to the ER.  Past Medical History:  Diagnosis Date  . Asthma   . CHF (congestive heart failure) ([place])   . Chronic back pain   . COPD (chronic obstructive pulmonary disease) ([place])   . Knee pain, chronic   . Migraines   . Schizophrenia, schizo-affective [facility])     Patient Active Problem List   Diagnosis Date Noted  . Acute respiratory failure with hypoxia ([place])   . Polysubstance overdose 02/26/2019  . Laceration of Achilles tendon, right, initial encounter   . Shock ([place]) 08/28/2018  . Suicide attempt ([place])   . Benzodiazepine (tranquilizer) overdose, intentional self-harm, initial encounter ([place]) 06/13/2018  . Overdose of benzodiazepine, intentional self-harm, initial encounter ([place])   . Somnolence   . Schizophrenia, paranoid type ([place]) 08/19/2017  . Hypokalemia 04/28/2014  . Drug overdose 04/27/2014  . Schizoaffective disorder, bipolar type ([place]) 03/02/2014  . Anxiety state 03/02/2014  . Benzodiazepine dependence ([place]) 03/02/2014  . Chest pain 10/11/2013  . EKG abnormalities 10/11/2013  . Chronic back pain     Past Surgical History:  Procedure Laterality Date  . KNEE SURGERY     four times   . WISDOM TOOTH EXTRACTION         Family History  Problem Relation Age of Onset  . Hypertension Father     Social History   Tobacco Use  . Smoking status: Current Every Day Smoker    Packs/day: 2.00    Years: 33.00    Pack years: 66.00    Types: Cigarettes  . Smokeless tobacco: Never Used  Substance Use Topics  . Alcohol use: No    Comment: occ  . Drug use: No    Comment: Pt denied    Home Medications Prior to Admission medications   Medication Sig Start Date End Date Taking? Authorizing Provider  ALPRAZolam [name]) 1 MG tablet Take 1 mg by mouth 4 (four) times daily.   Yes [provider]  fluPHENAZine (PROLIXIN) 10 MG tablet Take 10 mg by mouth at bedtime. 08/30/19  Yes [provider]  HYDROcodone-acetaminophen (NORCO) 10-325 MG tablet Take 1-2 tablets by mouth every 6 (six) hours as needed for moderate pain.   Yes [provider]  OLANZapine (ZYPREXA) 10 MG tablet Take 40 mg by mouth at bedtime.  07/30/18  Yes [provider]  acetaminophen (TYLENOL) 325 MG tablet Take 2 tablets (650 mg total) by mouth every 6 (six) hours as needed for mild pain, fever or headache. Patient not taking: Reported on 11/28/2018 09/06/18   Debbe Odea, MD  albuterol (VENTOLIN HFA) 108 (90 Base) MCG/ACT inhaler Inhale 2 puffs  into the lungs every 4 (four) hours as needed for wheezing or shortness of breath. 09/02/19   Varney Biles, MD  azithromycin (ZITHROMAX) 250 MG tablet Take 1 tablet (250 mg total) by mouth daily. Take first 2 tablets together, then 1 every day until finished. 09/02/19   Varney Biles, MD  diclofenac sodium (VOLTAREN) 1 % GEL Apply 4 g topically 4 (four) times daily as needed. Patient not taking: Reported on 11/28/2018 10/22/18   Hilts, Legrand Como, MD  mupirocin ointment (BACTROBAN) 2 % Place 1 application into the nose 2 (two) times daily. Patient not taking: Reported on 11/28/2018 10/22/18   Hilts, Legrand Como, MD  perphenazine (TRILAFON)  4 MG tablet Take 1 tablet (4 mg total) by mouth 2 (two) times daily. Patient not taking: Reported on 02/26/2019 09/06/18   Debbe Odea, MD    Allergies    Amoxicillin, Dextromethorphan-guaifenesin, Haloperidol, Haloperidol lactate, Meloxicam, Nsaids, Quetiapine, Sudafed [pseudoephedrine hcl], Ziprasidone, and Prednisone  Review of Systems   Review of Systems  Constitutional: Positive for activity change.  Respiratory: Positive for shortness of breath. Negative for cough.   Cardiovascular: Negative for chest pain.  Gastrointestinal: Negative for nausea and vomiting.  Allergic/Immunologic: Negative for immunocompromised state.  Hematological: Does not bruise/bleed easily.  All other systems reviewed and are negative.   Physical Exam Updated Vital Signs BP 115/80   Pulse 68   Temp 99.3 F (37.4 C) (Oral)   Resp 15   Ht 6\' 1"  (1.854 m)   Wt 89.8 kg   SpO2 99%   BMI 26.12 kg/m   Physical Exam Vitals and nursing note reviewed.  Constitutional:      Appearance: He is well-developed.  HENT:     Head: Atraumatic.  Cardiovascular:     Rate and Rhythm: Normal rate.  Pulmonary:     Effort: Pulmonary effort is normal.     Breath sounds: No decreased breath sounds, wheezing, rhonchi or rales.  Musculoskeletal:     Cervical back: Neck supple.     Right lower leg: No tenderness.     Left lower leg: No tenderness.  Skin:    General: Skin is warm.  Neurological:     Mental Status: He is oriented to person, place, and time.     ED Results / Procedures / Treatments   Labs (all labs ordered are listed, but only abnormal results are displayed) Labs Reviewed  POC SARS CORONAVIRUS 2 AG -  ED - Abnormal; Notable for the following components:      Result Value   SARS Coronavirus 2 Ag POSITIVE (*)    All other components within normal limits  SARS CORONAVIRUS 2 (TAT 6-24 HRS)    EKG EKG Interpretation  Date/Time:  Friday September 02 2019 01:56:32 EST Ventricular Rate:   74 PR Interval:    QRS Duration: 120 QT Interval:  386 QTC Calculation: 429 R Axis:   78 Text Interpretation: Sinus rhythm Multiple ventricular premature complexes Nonspecific intraventricular conduction delay Borderline repolarization abnormality No acute changes [id number] is new Confirmed by Varney Biles [phone]) on 09/02/2019 3:47:26 AM   Radiology DG Chest Port 1 View  Result Date: 09/02/2019 CLINICAL DATA:  COVID exposure shortness of breath EXAM: PORTABLE CHEST 1 VIEW COMPARISON:  None. FINDINGS: The heart size and mediastinal contours are within normal limits. Both lungs are clear. The visualized skeletal structures are unremarkable. IMPRESSION: No active disease. Electronically Signed   By: Prudencio Pair M.D.   On: 09/02/2019 02:35    Procedures Procedures (including  critical care time)  Medications Ordered in ED Medications  albuterol (VENTOLIN HFA) 108 (90 Base) MCG/ACT inhaler 6 puff (6 puffs Inhalation Given 09/02/19 0348)  dexamethasone (DECADRON) injection 10 mg (10 mg Intramuscular Refused 09/02/19 0349)    ED Course  I have reviewed the triage vital signs and the nursing notes.  Pertinent labs & imaging results that were available during my care of the patient were reviewed by me and considered in my medical decision making (see chart for details).  Clinical Course as of Sep 01 400  Fri Sep 02, 2019  0358 Patient requesting to be discharged with Z-Pak.  He has been monitored in the ED for close to 2 hours and has not had any decompensation.  Strict ER return precautions have been discussed, and patient is agreeing with the plan and is comfortable with the workup done and the recommendations from the ER.    [AN]    Clinical Course User Index [AN] Varney Biles, MD   MDM Rules/Calculators/A&P                      [name] was evaluated in Emergency Department on 09/02/2019 for the symptoms described in the history of present illness. He was evaluated in  the context of the global COVID-19 pandemic, which necessitated consideration that the patient might be at risk for infection with the SARS-CoV-2 virus that causes COVID-19. Institutional protocols and algorithms that pertain to the evaluation of patients at risk for COVID-19 are in a state of rapid change based on information released by regulatory bodies including the CDC and federal and state organizations. These policies and algorithms were followed during the patient's care in the ED.  48 year old comes in a chief complaint of shortness of breath.  His exam is pretty benign however his Covid test did come back positive.  He has had exposures. He has been ambulated in his room and he did not have any desaturations.  We monitored patient in the ED -it was noted that he had no hypoxia.  No labs ordered as he has no nausea, vomiting and overall is healthy.  Smoking cessation instruction/counseling given:  counseled patient on the dangers of tobacco use, advised patient to stop smoking, and reviewed strategies to maximize success.  Discussion for 2-3 min.   Final Clinical Impression(s) / ED Diagnoses Final diagnoses:  COVID-19 virus infection    Rx / DC Orders ED Discharge Orders         Ordered    azithromycin (ZITHROMAX) 250 MG tablet  Daily     09/02/19 0356    albuterol (VENTOLIN HFA) 108 (90 Base) MCG/ACT inhaler  Every 4 hours PRN     09/02/19 0356           Varney Biles, MD 09/02/19 0401

## 2019-09-02 NOTE — ED Notes (Signed)
[facility] sent downstairs with two sets of blood cultures and urine.

## 2019-09-02 NOTE — ED Triage Notes (Signed)
Patient states that he was exposed to covid 14 days ago. Patient states that he is having sob. Patient is speaking with no difficulty and O2 Sat - 100%. Patient states he started having sob yesterday. Patient smokes.

## 2019-09-02 NOTE — ED Notes (Signed)
Ambulated pt in room while monitoring oxygen level.  Pt's oxygen maintained at 95% or above throughout the duration.

## 2019-09-02 NOTE — Discharge Instructions (Signed)
You are diagnosed with COVID-19 during the stay.  Given that you have history of COPD, there is a possibility that your symptoms can get worse.  Take medication as prescribed. Return to the ER if your symptoms get worse.

## 2019-09-04 ENCOUNTER — Encounter (HOSPITAL_COMMUNITY): Payer: Self-pay

## 2019-09-04 ENCOUNTER — Other Ambulatory Visit: Payer: Self-pay

## 2019-09-04 ENCOUNTER — Emergency Department (HOSPITAL_COMMUNITY)
Admission: EM | Admit: 2019-09-04 | Discharge: 2019-09-04 | Disposition: A | Discharge status: Home / Self Care | Payer: Medicare Other | Attending: Emergency Medicine | Admitting: Emergency Medicine

## 2019-09-04 ENCOUNTER — Emergency Department (HOSPITAL_COMMUNITY): Payer: Medicare Other

## 2019-09-04 DIAGNOSIS — Y929 Unspecified place or not applicable: Secondary | ICD-10-CM | POA: Insufficient documentation

## 2019-09-04 DIAGNOSIS — Y9389 Activity, other specified: Secondary | ICD-10-CM | POA: Insufficient documentation

## 2019-09-04 DIAGNOSIS — J45909 Unspecified asthma, uncomplicated: Secondary | ICD-10-CM | POA: Diagnosis not present

## 2019-09-04 DIAGNOSIS — S29011A Strain of muscle and tendon of front wall of thorax, initial encounter: Secondary | ICD-10-CM | POA: Insufficient documentation

## 2019-09-04 DIAGNOSIS — X503XXA Overexertion from repetitive movements, initial encounter: Secondary | ICD-10-CM | POA: Insufficient documentation

## 2019-09-04 DIAGNOSIS — R7309 Other abnormal glucose: Secondary | ICD-10-CM | POA: Insufficient documentation

## 2019-09-04 DIAGNOSIS — R1012 Left upper quadrant pain: Secondary | ICD-10-CM | POA: Diagnosis present

## 2019-09-04 DIAGNOSIS — U071 COVID-19: Secondary | ICD-10-CM | POA: Diagnosis not present

## 2019-09-04 DIAGNOSIS — Y999 Unspecified external cause status: Secondary | ICD-10-CM | POA: Diagnosis not present

## 2019-09-04 DIAGNOSIS — J449 Chronic obstructive pulmonary disease, unspecified: Secondary | ICD-10-CM | POA: Diagnosis not present

## 2019-09-04 DIAGNOSIS — E876 Hypokalemia: Secondary | ICD-10-CM | POA: Diagnosis not present

## 2019-09-04 DIAGNOSIS — F17210 Nicotine dependence, cigarettes, uncomplicated: Secondary | ICD-10-CM | POA: Insufficient documentation

## 2019-09-04 DIAGNOSIS — I509 Heart failure, unspecified: Secondary | ICD-10-CM | POA: Diagnosis not present

## 2019-09-04 LAB — COMPREHENSIVE METABOLIC PANEL
ALT: 22 U/L (ref 0–44)
AST: 21 U/L (ref 15–41)
Albumin: 3.7 g/dL (ref 3.5–5.0)
Alkaline Phosphatase: 49 U/L (ref 38–126)
Anion gap: 8 (ref 5–15)
BUN: <5 mg/dL — ABNORMAL LOW (ref 6–20)
CO2: 25 mmol/L (ref 22–32)
Calcium: 8.8 mg/dL — ABNORMAL LOW (ref 8.9–10.3)
Chloride: 105 mmol/L (ref 98–111)
Creatinine, Ser: 0.88 mg/dL (ref 0.61–1.24)
GFR calc Af Amer: >60 mL/min (ref >60)
GFR calc non Af Amer: >60 mL/min (ref >60)
Glucose, Bld: 165 mg/dL — ABNORMAL HIGH (ref 70–99)
Potassium: 2.9 mmol/L — ABNORMAL LOW (ref 3.5–5.1)
Sodium: 138 mmol/L (ref 135–145)
Total Bilirubin: 0.4 mg/dL (ref 0.3–1.2)
Total Protein: 6.7 g/dL (ref 6.5–8.1)

## 2019-09-04 LAB — URINALYSIS, ROUTINE W REFLEX MICROSCOPIC
Bilirubin Urine: NEGATIVE
Glucose, UA: NEGATIVE mg/dL
Hgb urine dipstick: NEGATIVE
Ketones, ur: NEGATIVE mg/dL
Leukocytes,Ua: NEGATIVE
Nitrite: NEGATIVE
Protein, ur: NEGATIVE mg/dL
Specific Gravity, Urine: 1.008 (ref 1.005–1.030)
pH: 6 (ref 5.0–8.0)

## 2019-09-04 LAB — CBC
HCT: 44.4 % (ref 39.0–52.0)
Hemoglobin: 14.3 g/dL (ref 13.0–17.0)
MCH: 29.8 pg (ref 26.0–34.0)
MCHC: 32.2 g/dL (ref 30.0–36.0)
MCV: 92.5 fL (ref 80.0–100.0)
Platelets: 142 10*3/uL — ABNORMAL LOW (ref 150–400)
RBC: 4.8 MIL/uL (ref 4.22–5.81)
RDW: 12.5 % (ref 11.5–15.5)
WBC: 4.2 10*3/uL (ref 4.0–10.5)
nRBC: 0 % (ref 0.0–0.2)

## 2019-09-04 LAB — LIPASE, BLOOD: Lipase: 25 U/L (ref 11–51)

## 2019-09-04 MED ORDER — ONDANSETRON 4 MG PO TBDP: 4.0000 mg | ORAL_TABLET | Freq: Once | ORAL | Status: DC | PRN

## 2019-09-04 MED ORDER — POTASSIUM CHLORIDE CRYS ER 20 MEQ PO TBCR
40.0000 meq | EXTENDED_RELEASE_TABLET | Freq: Once | ORAL | Status: AC
  Administered 2019-09-04: 40 meq via ORAL
  Filled 2019-09-04: qty 2

## 2019-09-04 MED ORDER — POTASSIUM CHLORIDE CRYS ER 20 MEQ PO TBCR: 20.0000 meq | EXTENDED_RELEASE_TABLET | Freq: Two times a day (BID) | ORAL | 0 refills | Status: DC

## 2019-09-04 NOTE — ED Notes (Signed)
Pt encouraged to give urine sample. Pt denies needing to void right now, but pt given urinal. Pt has call bell within reach.

## 2019-09-04 NOTE — ED Triage Notes (Signed)
Patient coming from home with complaints of left sided abdominal pain that started yesterday at 0230. Patient has tenderness on the left side as well. Patient endorses nausea and vomiting, but denies diarrhea.

## 2019-09-04 NOTE — ED Provider Notes (Signed)
[facility] DEPT Provider Note   CSN: [id number] Arrival date & time: 09/04/19  0118   History Chief Complaint  Patient presents with  . COVID +  . Abdominal Pain    [name] is a 48 y.o. male.  The history is provided by the patient.  Abdominal Pain He has history of asthma, COPD, heart failure, schizophrenia and tested positive for COVID-19 2 days ago.  He comes in complaining of left upper abdominal pain for the last 24 hours.  Pain is worse with palpation, better with laying still.  He denies fever or chills.  There has been no nausea or vomiting and there has been no diarrhea.  Pain is rated at 0 when he lays still, will get as bad as 5/10 with movement.  Pain is also somewhat worse if he takes a deep breath.  He wonders if he might have broken a rib while coughing.  Past Medical History:  Diagnosis Date  . Asthma   . CHF (congestive heart failure) ([place])   . Chronic back pain   . COPD (chronic obstructive pulmonary disease) ([place])   . Knee pain, chronic   . Migraines   . Schizophrenia, schizo-affective [facility])     Patient Active Problem List   Diagnosis Date Noted  . Acute respiratory failure with hypoxia ([place])   . Polysubstance overdose 02/26/2019  . Laceration of Achilles tendon, right, initial encounter   . Shock ([place]) 08/28/2018  . Suicide attempt ([place])   . Benzodiazepine (tranquilizer) overdose, intentional self-harm, initial encounter ([place]) 06/13/2018  . Overdose of benzodiazepine, intentional self-harm, initial encounter ([place])   . Somnolence   . Schizophrenia, paranoid type ([place]) 08/19/2017  . Hypokalemia 04/28/2014  . Drug overdose 04/27/2014  . Schizoaffective disorder, bipolar type ([place]) 03/02/2014  . Anxiety state 03/02/2014  . Benzodiazepine dependence ([place]) 03/02/2014  . Chest pain 10/11/2013  . EKG abnormalities 10/11/2013  . Chronic back pain     Past Surgical History:  Procedure Laterality Date  . KNEE SURGERY      four times  . WISDOM TOOTH EXTRACTION         Family History  Problem Relation Age of Onset  . Hypertension Father     Social History   Tobacco Use  . Smoking status: Current Every Day Smoker    Packs/day: 1.00    Years: 33.00    Pack years: 33.00    Types: Cigarettes  . Smokeless tobacco: Never Used  Substance Use Topics  . Alcohol use: No    Comment: occ  . Drug use: No    Comment: Pt denied    Home Medications Prior to Admission medications   Medication Sig Start Date End Date Taking? Authorizing Provider  acetaminophen (TYLENOL) 500 MG tablet Take 1,000 mg by mouth every 6 (six) hours as needed for moderate pain.   Yes [provider]  albuterol (VENTOLIN HFA) 108 (90 Base) MCG/ACT inhaler Inhale 2 puffs into the lungs every 4 (four) hours as needed for wheezing or shortness of breath. 09/02/19  Yes Varney Biles, MD  ALPRAZolam [name]) 1 MG tablet Take 1 mg by mouth 4 (four) times daily.   Yes [provider]  azithromycin (ZITHROMAX) 250 MG tablet Take 1 tablet (250 mg total) by mouth daily. Take first 2 tablets together, then 1 every day until finished. 09/02/19  Yes Varney Biles, MD  fluPHENAZine (PROLIXIN) 10 MG tablet Take 10 mg by mouth at bedtime. 08/30/19  Yes [provider]  HYDROcodone-acetaminophen (NORCO) 10-325 MG tablet Take 1-2 tablets by mouth every 6 (six) hours as needed for moderate pain.   Yes [provider]  OLANZapine (ZYPREXA) 10 MG tablet Take 40 mg by mouth at bedtime.  07/30/18  Yes [provider]  acetaminophen (TYLENOL) 325 MG tablet Take 2 tablets (650 mg total) by mouth every 6 (six) hours as needed for mild pain, fever or headache. Patient not taking: Reported on 11/28/2018 09/06/18   Debbe Odea, MD  diclofenac sodium (VOLTAREN) 1 % GEL Apply 4 g topically 4 (four) times daily as needed. Patient not taking: Reported on 11/28/2018 10/22/18   Hilts, Legrand Como, MD  mupirocin ointment  (BACTROBAN) 2 % Place 1 application into the nose 2 (two) times daily. Patient not taking: Reported on 11/28/2018 10/22/18   Hilts, Legrand Como, MD  perphenazine (TRILAFON) 4 MG tablet Take 1 tablet (4 mg total) by mouth 2 (two) times daily. Patient not taking: Reported on 02/26/2019 09/06/18   Debbe Odea, MD    Allergies    Amoxicillin, Dextromethorphan-guaifenesin, Haloperidol, Haloperidol lactate, Meloxicam, Nsaids, Quetiapine, Sudafed [pseudoephedrine hcl], Ziprasidone, and Prednisone  Review of Systems   Review of Systems  Gastrointestinal: Positive for abdominal pain.  All other systems reviewed and are negative.   Physical Exam Updated Vital Signs BP (!) 147/77 (BP Location: Right Arm)   Pulse 77   Temp 98.5 F (36.9 C) (Oral)   Resp 16   Ht 6\' 1"  (1.854 m)   Wt 89.8 kg   SpO2 99%   BMI 26.12 kg/m   Physical Exam Vitals and nursing note reviewed.   48 year old male, resting comfortably and in no acute distress. Vital signs are significant for mildly elevated blood pressure. Oxygen saturation is 99%, which is normal. Head is normocephalic and atraumatic. PERRLA, EOMI. Oropharynx is clear. Neck is nontender and supple without adenopathy or JVD. Back is nontender and there is no CVA tenderness. Lungs are clear without rales, wheezes, or rhonchi. Chest is nontender. Heart has regular rate and rhythm without murmur. Abdomen is soft, flat, with point tenderness in the left upper quadrant at the costal margin.  There is no crepitus.  There are no masses or hepatosplenomegaly and peristalsis is normoactive. Extremities have no cyanosis or edema, full range of motion is present. Skin is warm and dry without rash. Neurologic: Mental status is normal, cranial nerves are intact, there are no motor or sensory deficits.  ED Results / Procedures / Treatments   Labs (all labs ordered are listed, but only abnormal results are displayed) Labs Reviewed  COMPREHENSIVE METABOLIC PANEL -  Abnormal; Notable for the following components:      Result Value   Potassium 2.9 (*)    Glucose, Bld 165 (*)    BUN <5 (*)    Calcium 8.8 (*)    All other components within normal limits  CBC - Abnormal; Notable for the following components:   Platelets 142 (*)    All other components within normal limits  LIPASE, BLOOD  URINALYSIS, ROUTINE W REFLEX MICROSCOPIC    EKG EKG Interpretation  Date/Time:  Sunday September 04 2019 01:55:42 EST Ventricular Rate:  69 PR Interval:    QRS Duration: 99 QT Interval:  379 QTC Calculation: 406 R Axis:   82 Text Interpretation: Sinus rhythm Ventricular premature complex Borderline repolarization abnormality When compared with ECG of 09/02/2019, No significant change was found Confirmed by Delora Fuel ([id number]) on 09/04/2019 2:06:26 AM   Radiology  No results found.  Procedures Procedures  Medications Ordered in ED Medications  ondansetron (ZOFRAN-ODT) disintegrating tablet 4 mg (has no administration in time range)    ED Course  I have reviewed the triage vital signs and the nursing notes.  Pertinent labs & imaging results that were available during my care of the patient were reviewed by me and considered in my medical decision making (see chart for details).  MDM Rules/Calculators/A&P Left upper quadrant pain which seems likely to be either rib fracture or intercostal muscle strain given physical exam findings of point tenderness and recent history of cough and Covid positive status.  Old records reviewed confirming ED visit 2 days ago with positive COVID-19 testing.  Labs obtained here are significant for hypokalemia and he is given oral potassium.  Elevated random glucose is also noted and is felt to be secondary to physiologic stress related to COVID-19 infection.  He is being sent for rib x-rays.  X-rays showed no clear fracture.  However, I suspect either occult fracture or intercostal muscle strain.  Patient is advised of these  findings.  He is discharged with prescription for K. Dur, advised to use ice for his rib cage pain.  He does have hydrocodone-acetaminophen at home when he is told that he may use that but preference would be to use plain acetaminophen.  Return precautions discussed.  [name] Moroney was evaluated in Emergency Department on 09/04/2019 for the symptoms described in the history of present illness. He was evaluated in the context of the global COVID-19 pandemic, which necessitated consideration that the patient might be at risk for infection with the SARS-CoV-2 virus that causes COVID-19. Institutional protocols and algorithms that pertain to the evaluation of patients at risk for COVID-19 are in a state of rapid change based on information released by regulatory bodies including the CDC and federal and state organizations. These policies and algorithms were followed during the patient's care in the ED.  Final Clinical Impression(s) / ED Diagnoses Final diagnoses:  Intercostal muscle strain, initial encounter  COVID-19 virus infection  Hypokalemia  Elevated random blood glucose level    Rx / DC Orders ED Discharge Orders         Ordered    potassium chloride SA (KLOR-CON) 20 MEQ tablet  2 times daily     09/04/19 [id number]           Delora Fuel, MD [id number]

## 2019-09-04 NOTE — ED Notes (Signed)
Pt came out of room requesting to leave AMA, requesting to speak with his doctor. Lovena Le, RN notified Roxanne Mins, MD

## 2019-09-04 NOTE — ED Notes (Signed)
Pt encouraged to not drink anything until the doctor speaks with him; pt refuses and is drinking water at bedside.

## 2019-09-04 NOTE — ED Notes (Signed)
Pt given new urinal, warm blankets and has call bell and personal phones within reach.

## 2019-09-04 NOTE — ED Notes (Signed)
Patient transported to x-ray. ?

## 2019-09-04 NOTE — ED Notes (Signed)
Pt attempting to come out of the room. Pt made aware that he is COVID+ and needs to stay in the room and use the call bell if assistance is needed.

## 2019-09-04 NOTE — ED Notes (Signed)
Roxanne Mins, MD at bedside

## 2019-09-04 NOTE — ED Notes (Signed)
Patient made aware that urine sample is needed. Urinal at bedside.  

## 2019-09-04 NOTE — Discharge Instructions (Addendum)
Apply ice as needed.   Take pain medication as needed.  Return if you are having trouble breathing.

## 2019-09-23 ENCOUNTER — Ambulatory Visit (INDEPENDENT_AMBULATORY_CARE_PROVIDER_SITE_OTHER): Payer: Medicare Other | Admitting: Orthopaedic Surgery

## 2019-09-23 ENCOUNTER — Other Ambulatory Visit: Payer: Self-pay

## 2019-09-23 ENCOUNTER — Encounter: Payer: Self-pay | Admitting: Orthopaedic Surgery

## 2019-09-23 VITALS — Ht 73.0 in | Wt 189.0 lb

## 2019-09-23 DIAGNOSIS — S86021A Laceration of right Achilles tendon, initial encounter: Secondary | ICD-10-CM | POA: Diagnosis not present

## 2019-09-23 NOTE — Progress Notes (Signed)
Office Visit Note   Patient: [name]           Date of Birth: 05-Dec-1970           MRN: [id number] Visit Date: 09/23/2019              Requested by: Christain Sacramento, MD [place],  [place] [address] PCP: Christain Sacramento, MD   Assessment & Plan: Visit Diagnoses:  1. Laceration of Achilles tendon, right, initial encounter     Plan: My impression is chronic laceration to the calf muscle and suspected tibial nerve laceration as well.  We will obtain nerve conduction studies for this as well as an MRI of this region to fully assess the extent of injury and to determine whether there are any surgical options.  He will follow-up after the studies.  Follow-Up Instructions: Return if symptoms worsen or fail to improve.   Orders:  No orders of the defined types were placed in this encounter.  No orders of the defined types were placed in this encounter.     Procedures: No procedures performed   Clinical Data: No additional findings.   Subjective: Chief Complaint  Patient presents with  . Right Ankle - Pain    [name] is a 49 year old gentleman who comes in for evaluation of chronic right ankle and calf pain and foot numbness.  I saw him in 2019 when he kicked a glass window and sustained a traumatic laceration.  He was lost to follow-up.  The patient was a drug addict at the time.  He comes in today with persistent calf pain and numbness to the bottom of his foot.  He is requesting surgery.   Review of Systems  Constitutional: Negative.   All other systems reviewed and are negative.    Objective: Vital Signs: Ht 6\' 1"  (1.854 m)   Wt 189 lb (85.7 kg)   BMI 24.94 kg/m   Physical Exam Vitals and nursing note reviewed.  Constitutional:      Appearance: He is well-developed.  Pulmonary:     [place]: Pulmonary effort is normal.  Abdominal:     Palpations: Abdomen is soft.  Skin:    General: Skin is warm.  Neurological:     Mental Status: He is  alert and oriented to person, place, and time.  Psychiatric:        Behavior: Behavior normal.        Thought Content: Thought content normal.        Judgment: Judgment normal.     Ortho Exam Right lower extremity exam demonstrates a healed traumatic laceration over the myotendinous region of the calf muscle.  He is able to plantarflex.  Abnormal Thompson's.  He has decreased sensation to the plantar aspect of his foot.  He does have clawing of his toes. Specialty Comments:  No specialty comments available.  Imaging: No results found.   PMFS History: Patient Active Problem List   Diagnosis Date Noted  . Acute respiratory failure with hypoxia ([place])   . Polysubstance overdose 02/26/2019  . Laceration of Achilles tendon, right, initial encounter   . Shock ([place]) 08/28/2018  . Suicide attempt ([place])   . Benzodiazepine (tranquilizer) overdose, intentional self-harm, initial encounter ([place]) 06/13/2018  . Overdose of benzodiazepine, intentional self-harm, initial encounter ([place])   . Somnolence   . Schizophrenia, paranoid type ([place]) 08/19/2017  . Hypokalemia 04/28/2014  . Drug overdose 04/27/2014  . Schizoaffective disorder, bipolar type ([place]) 03/02/2014  .  Anxiety state 03/02/2014  . Benzodiazepine dependence ([place]) 03/02/2014  . Chest pain 10/11/2013  . EKG abnormalities 10/11/2013  . Chronic back pain    Past Medical History:  Diagnosis Date  . Asthma   . CHF (congestive heart failure) ([place])   . Chronic back pain   . COPD (chronic obstructive pulmonary disease) ([place])   . Knee pain, chronic   . Migraines   . Schizophrenia, schizo-affective ([place])     Family History  Problem Relation Age of Onset  . Hypertension Father     Past Surgical History:  Procedure Laterality Date  . KNEE SURGERY     four times  . WISDOM TOOTH EXTRACTION     Social History   Occupational History  . Occupation: on disability  Tobacco Use  . Smoking status: Current Every Day Smoker     Packs/day: 1.00    Years: 33.00    Pack years: 33.00    Types: Cigarettes  . Smokeless tobacco: Never Used  Substance and Sexual Activity  . Alcohol use: No    Comment: occ  . Drug use: No    Comment: Pt denied  . Sexual activity: Not Currently

## 2019-09-23 NOTE — Addendum Note (Signed)
Addended by: Precious Bard on: 09/23/2019 11:42 AM   Modules accepted: Orders

## 2019-10-12 ENCOUNTER — Encounter: Payer: Medicare Other | Admitting: Physical Medicine and Rehabilitation

## 2019-10-25 ENCOUNTER — Other Ambulatory Visit: Payer: Self-pay

## 2019-10-25 ENCOUNTER — Ambulatory Visit (INDEPENDENT_AMBULATORY_CARE_PROVIDER_SITE_OTHER): Payer: Medicare Other | Admitting: Physical Medicine and Rehabilitation

## 2019-10-25 DIAGNOSIS — R202 Paresthesia of skin: Secondary | ICD-10-CM | POA: Diagnosis not present

## 2019-10-25 DIAGNOSIS — R531 Weakness: Secondary | ICD-10-CM

## 2019-10-25 NOTE — Progress Notes (Signed)
.  Numeric Pain Rating Scale and Functional Assessment Average Pain 10   In the last MONTH (on 0-10 scale) has pain interfered with the following?  1. General activity like being  able to carry out your everyday physical activities such as walking, climbing stairs, carrying groceries, or moving a chair?  Rating(10)     

## 2019-10-27 ENCOUNTER — Ambulatory Visit
Admission: RE | Admit: 2019-10-27 | Discharge: 2019-10-27 | Disposition: A | Discharge status: Home / Self Care | Payer: Medicare Other | Source: Ambulatory Visit | Attending: Orthopaedic Surgery | Admitting: Orthopaedic Surgery

## 2019-10-27 ENCOUNTER — Other Ambulatory Visit: Payer: Self-pay

## 2019-10-27 DIAGNOSIS — S86021A Laceration of right Achilles tendon, initial encounter: Secondary | ICD-10-CM

## 2019-10-31 NOTE — Progress Notes (Signed)
[name] Sanderson - 49 y.o. male MRN [id number]  Date of birth: 05/10/1971  Office Visit Note: Visit Date: 10/25/2019 PCP: Christain Sacramento, MD Referred by: Christain Sacramento, MD  Subjective: Chief Complaint  Patient presents with  . Right Leg - Injury   HPI: [name] is a 49 y.o. male who comes in today At the request of Dr. Eduard Roux for electrodiagnostic study of the right lower limb.  Patient is a very poor historian but tells me today that he suffers from significant mental health issues and is well is on chronic pain medications.  He reports many attempts to overdose on psychiatric medications.  He reports what he says a year ago having delusions and during that episode jumping through a glass window and lacerating his right lower leg.  Since that time he has had pain numbness and weakness.  He reports that his toes want to curl on the right.  He reports the ability to dorsiflex and straighten his big toe.  He denies any symptoms on the left.  He denies any specific radicular pain down the legs.  He has had a long history of back pain.  He has not had prior electrodiagnostic studies.  According to the notes from Dr. Wyline Copas this injury was actually in 2019.  Since that time the patient reports improvement in his ability to move the lower limb.  He was evidently lost to follow-up because of a lot of issues going on in his life.  ROS Otherwise per HPI.  Assessment & Plan: Visit Diagnoses:  1. Paresthesia of skin   2. 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  The nerve conductions actually show good continuity of the tibial nerve without much in the way of severe findings but the needle EMG does show denervation pattern in the abductor hallucis.  That needle EMG finding does however show active motor unit action potentials and signs of reinnervation.    There is no significant electrodiagnostic evidence of  any other focal nerve entrapment, lumbosacral plexopathy or lumbar radiculopathy.   Recommendations: 1.  Follow-up with referring physician. 2.  Continue current management of symptoms.   Meds & Orders: No orders of the defined types were placed in this encounter.   Orders Placed This Encounter  Procedures  . NCV with EMG (electromyography)    Follow-up: Return in about 2 weeks (around 11/08/2019) for  Eduard Roux, M.D..   Procedures: No procedures performed  EMG & NCV Findings: Evaluation of the right fibular motor and the right tibial motor nerves showed reduced amplitude (R2.3, R1.5 mV).  The right sural sensory nerve showed prolonged distal peak latency (5.6 ms) and decreased conduction velocity (Calf-Lat Mall, 25 m/s).  All remaining nerves (as indicated in the following tables) were within normal limits.    Needle evaluation of the right abductor hallucis muscle showed increased spontaneous activity and diminished recruitment.  All remaining muscles (as indicated in the following table) showed no evidence of electrical instability.    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  The nerve conductions actually show good continuity of the tibial nerve without much in the way of severe findings but the needle EMG does show denervation pattern in the abductor hallucis.  That needle EMG finding does however show active motor unit action potentials and signs of reinnervation.    There is no  significant electrodiagnostic evidence of any other focal nerve entrapment, lumbosacral plexopathy or lumbar radiculopathy.   Recommendations: 1.  Follow-up with referring physician. 2.  Continue current management of symptoms.  ___________________________ Laurence Spates FAAPMR Board Certified, American Board of Physical Medicine and Rehabilitation    Nerve Conduction Studies Anti Sensory Summary Table   Stim Site NR Peak (ms)  Norm Peak (ms) P-T Amp (V) Norm P-T Amp Site1 Site2 Delta-P (ms) Dist (cm) Vel (m/s) Norm Vel (m/s)  Right Sup Fibular Anti Sensory (Ant Lat Mall)  26.9C  14 cm    4.1 <4.4 8.3 >5.0 14 cm Ant Lat Mall 4.1 14.0 34 >32  Right Sural Anti Sensory (Lat Mall)  26.2C  Calf    *5.6 <4.0 12.0 >5.0 Calf Lat Mall 5.6 14.0 *25 >35  Site 2    7.2  20.9          Motor Summary Table   Stim Site NR Onset (ms) Norm Onset (ms) O-P Amp (mV) Norm O-P Amp Site1 Site2 Delta-0 (ms) Dist (cm) Vel (m/s) Norm Vel (m/s)  Right Fibular Motor (Ext Dig Brev)  25.9C  Ankle    5.9 <6.1 *2.3 >2.5 B Fib Ankle 8.9 34.0 38 >38  B Fib    14.8  3.9  Poplt B Fib 2.5 10.0 40 >40  Poplt    17.3  3.8         Right Tibial Motor (Abd Hall Brev)  26.4C  Ankle    5.6 <6.1 *1.5 >3.0 Knee Ankle 11.7 41.0 35 >35  Knee    17.3  1.3          EMG   Side Muscle Nerve Root Ins Act Fibs Psw Amp Dur Poly Recrt Int Fraser Din Comment  Right AntTibialis Dp Br Peron L4-5 Nml Nml Nml Nml Nml 0 Nml Nml   Right Fibularis Longus  Sup Br Peron L5-S1 Nml Nml Nml Nml Nml 0 Nml Nml   Right MedGastroc Tibial S1-2 Nml Nml Nml Nml Nml 0 Nml Nml   Right VastusMed Femoral L2-4 Nml Nml Nml Nml Nml 0 Nml Nml   Right BicepsFemS Sciatic L5-S1 Nml Nml Nml Nml Nml 0 Nml Nml   Right PostTibialis Tibial L5, S1 Nml Nml Nml Nml Nml 0 Nml Nml   Right Soleus Tibial L5-S2 Nml Nml Nml Nml Nml 0 Nml Nml   Right AbdHallucis MedPlantar S1-2 Nml *3+ *3+ Nml Nml 0 *Reduced Nml     Nerve Conduction Studies Anti Sensory Left/Right Comparison   Stim Site L Lat (ms) R Lat (ms) L-R Lat (ms) L Amp (V) R Amp (V) L-R Amp (%) Site1 Site2 L Vel (m/s) R Vel (m/s) L-R Vel (m/s)  Sup Fibular Anti Sensory (Ant Lat Mall)  26.9C  14 cm  4.1   8.3  14 cm Ant Lat Mall  34   Sural Anti Sensory (Lat Mall)  26.2C  Calf  *5.6   12.0  Calf Lat Mall  *25   Site 2  7.2   20.9         Motor Left/Right Comparison   Stim Site L Lat (ms) R Lat (ms) L-R Lat (ms) L Amp (mV) R Amp (mV) L-R  Amp (%) Site1 Site2 L Vel (m/s) R Vel (m/s) L-R Vel (m/s)  Fibular Motor (Ext Dig Brev)  25.9C  Ankle  5.9   *2.3  B Fib Ankle  38   B Fib  14.8   3.9  Poplt B Fib  40   Poplt  17.3   3.8        Tibial Motor (Abd Hall Brev)  26.4C  Ankle  5.6   *1.5  Knee Ankle  35   Knee  17.3   1.3           Waveforms:            Clinical History: No specialty comments available.   He reports that he has been smoking cigarettes. He has a 33.00 pack-year smoking history. He has never used smokeless tobacco. No results for input(s): HGBA1C, LABURIC in the last 8760 hours.  Objective:  VS:  HT:    WT:   BMI:     BP:   HR: bpm  TEMP: ( )  RESP:  Physical Exam Musculoskeletal:        General: No tenderness.     Comments: Inspection reveals right lower healed scar at the level of the Achilles tendon and gastrocnemius.  There is a visible tissue deficit.  Inspecting both feet shows decreased foot intrinsic musculature bilaterally.  There appears to be be good calf muscle bulk bilaterally.  Patient can dorsiflex and fire EHL on both sides equally.  He has some difficulty but does fire plantar flexors on the right.  He has some weakness with toe flexion and abduction.  Sensation is intact.  There is no allodynia or swelling.  Skin:    General: Skin is warm and dry.     Findings: No erythema or rash.  Neurological:     General: No focal deficit present.     Mental Status: He is alert and oriented to person, place, and time.     Sensory: No sensory deficit.     Motor: Weakness present. No abnormal muscle tone.     Coordination: Coordination normal.     Gait: Gait abnormal.  Psychiatric:        Mood and Affect: Mood normal.        Behavior: Behavior normal.        Thought Content: Thought content normal.     Ortho Exam Imaging: No results found.  Past Medical/Family/Surgical/Social History: Medications & Allergies reviewed per EMR, new medications updated. Patient Active Problem List     Diagnosis Date Noted  . Acute respiratory failure with hypoxia ([place])   . Polysubstance overdose 02/26/2019  . Laceration of Achilles tendon, right, initial encounter   . Shock ([place]) 08/28/2018  . Suicide attempt ([place])   . Benzodiazepine (tranquilizer) overdose, intentional self-harm, initial encounter ([place]) 06/13/2018  . Overdose of benzodiazepine, intentional self-harm, initial encounter ([place])   . Somnolence   . Schizophrenia, paranoid type ([place]) 08/19/2017  . Hypokalemia 04/28/2014  . Drug overdose 04/27/2014  . Schizoaffective disorder, bipolar type ([place]) 03/02/2014  . Anxiety state 03/02/2014  . Benzodiazepine dependence ([place]) 03/02/2014  . Chest pain 10/11/2013  . EKG abnormalities 10/11/2013  . Chronic back pain    Past Medical History:  Diagnosis Date  . Asthma   . CHF (congestive heart failure) ([place])   . Chronic back pain   . COPD (chronic obstructive pulmonary disease) ([place])   . Knee pain, chronic   . Migraines   . Schizophrenia, schizo-affective ([place])    Family History  Problem Relation Age of Onset  . Hypertension Father    Past Surgical History:  Procedure Laterality Date  . KNEE SURGERY     four times  . [place] EXTRACTION     Social History  Occupational History  . Occupation: on disability  Tobacco Use  . Smoking status: Current Every Day Smoker    Packs/day: 1.00    Years: 33.00    Pack years: 33.00    Types: Cigarettes  . Smokeless tobacco: Never Used  Substance and Sexual Activity  . Alcohol use: No    Comment: occ  . Drug use: No    Comment: Pt denied  . Sexual activity: Not Currently

## 2019-10-31 NOTE — Procedures (Signed)
EMG & NCV Findings: Evaluation of the right fibular motor and the right tibial motor nerves showed reduced amplitude (R2.3, R1.5 mV).  The right sural sensory nerve showed prolonged distal peak latency (5.6 ms) and decreased conduction velocity (Calf-Lat Mall, 25 m/s).  All remaining nerves (as indicated in the following tables) were within normal limits.    Needle evaluation of the right abductor hallucis muscle showed increased spontaneous activity and diminished recruitment.  All remaining muscles (as indicated in the following table) showed no evidence of electrical instability.    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  The nerve conductions actually show good continuity of the tibial nerve without much in the way of severe findings but the needle EMG does show denervation pattern in the abductor hallucis.  That needle EMG finding does however show active motor unit action potentials and signs of reinnervation.    There is no significant electrodiagnostic evidence of any other focal nerve entrapment, lumbosacral plexopathy or lumbar radiculopathy.   Recommendations: 1.  Follow-up with referring physician. 2.  Continue current management of symptoms.  ___________________________ Laurence Spates FAAPMR Board Certified, American Board of Physical Medicine and Rehabilitation    Nerve Conduction Studies Anti Sensory Summary Table   Stim Site NR Peak (ms) Norm Peak (ms) P-T Amp (V) Norm P-T Amp Site1 Site2 Delta-P (ms) Dist (cm) Vel (m/s) Norm Vel (m/s)  Right Sup Fibular Anti Sensory (Ant Lat Mall)  26.9C  14 cm    4.1 <4.4 8.3 >5.0 14 cm Ant Lat Mall 4.1 14.0 34 >32  Right Sural Anti Sensory (Lat Mall)  26.2C  Calf    *5.6 <4.0 12.0 >5.0 Calf Lat Mall 5.6 14.0 *25 >35  Site 2    7.2  20.9          Motor Summary Table   Stim Site NR Onset (ms) Norm Onset (ms) O-P Amp (mV) Norm O-P Amp Site1 Site2 Delta-0 (ms)  Dist (cm) Vel (m/s) Norm Vel (m/s)  Right Fibular Motor (Ext Dig Brev)  25.9C  Ankle    5.9 <6.1 *2.3 >2.5 B Fib Ankle 8.9 34.0 38 >38  B Fib    14.8  3.9  Poplt B Fib 2.5 10.0 40 >40  Poplt    17.3  3.8         Right Tibial Motor (Abd Hall Brev)  26.4C  Ankle    5.6 <6.1 *1.5 >3.0 Knee Ankle 11.7 41.0 35 >35  Knee    17.3  1.3          EMG   Side Muscle Nerve Root Ins Act Fibs Psw Amp Dur Poly Recrt Int Fraser Din Comment  Right AntTibialis Dp Br Peron L4-5 Nml Nml Nml Nml Nml 0 Nml Nml   Right Fibularis Longus  Sup Br Peron L5-S1 Nml Nml Nml Nml Nml 0 Nml Nml   Right MedGastroc Tibial S1-2 Nml Nml Nml Nml Nml 0 Nml Nml   Right VastusMed Femoral L2-4 Nml Nml Nml Nml Nml 0 Nml Nml   Right BicepsFemS Sciatic L5-S1 Nml Nml Nml Nml Nml 0 Nml Nml   Right PostTibialis Tibial L5, S1 Nml Nml Nml Nml Nml 0 Nml Nml   Right Soleus Tibial L5-S2 Nml Nml Nml Nml Nml 0 Nml Nml   Right AbdHallucis MedPlantar S1-2 Nml *3+ *3+ Nml Nml 0 *Reduced Nml     Nerve Conduction Studies Anti Sensory Left/Right Comparison   Stim Site L Lat (ms)  R Lat (ms) L-R Lat (ms) L Amp (V) R Amp (V) L-R Amp (%) Site1 Site2 L Vel (m/s) R Vel (m/s) L-R Vel (m/s)  Sup Fibular Anti Sensory (Ant Lat Mall)  26.9C  14 cm  4.1   8.3  14 cm Ant Lat Mall  34   Sural Anti Sensory (Lat Mall)  26.2C  Calf  *5.6   12.0  Calf Lat Mall  *25   Site 2  7.2   20.9         Motor Left/Right Comparison   Stim Site L Lat (ms) R Lat (ms) L-R Lat (ms) L Amp (mV) R Amp (mV) L-R Amp (%) Site1 Site2 L Vel (m/s) R Vel (m/s) L-R Vel (m/s)  Fibular Motor (Ext Dig Brev)  25.9C  Ankle  5.9   *2.3  B Fib Ankle  38   B Fib  14.8   3.9  Poplt B Fib  40   Poplt  17.3   3.8        Tibial Motor (Abd Hall Brev)  26.4C  Ankle  5.6   *1.5  Knee Ankle  35   Knee  17.3   1.3           Waveforms:

## 2019-11-01 ENCOUNTER — Ambulatory Visit (INDEPENDENT_AMBULATORY_CARE_PROVIDER_SITE_OTHER): Payer: Medicare Other | Admitting: Orthopaedic Surgery

## 2019-11-01 ENCOUNTER — Other Ambulatory Visit: Payer: Self-pay

## 2019-11-01 ENCOUNTER — Encounter: Payer: Self-pay | Admitting: Orthopaedic Surgery

## 2019-11-01 DIAGNOSIS — M899 Disorder of bone, unspecified: Secondary | ICD-10-CM | POA: Diagnosis not present

## 2019-11-01 DIAGNOSIS — M949 Disorder of cartilage, unspecified: Secondary | ICD-10-CM

## 2019-11-01 NOTE — Progress Notes (Signed)
Office Visit Note   Patient: [name]           Date of Birth: [birth year]           MRN: [id number] Visit Date: 11/01/2019              Requested by: Christain Sacramento, MD [address] [place],  [place] [address] PCP: Christain Sacramento, MD   Assessment & Plan: Visit Diagnoses:  1. Osteochondral talar dome lesion     Plan: The nerve conduction studies were reviewed with the patient in detail.  We will continue to give this time to heal as the needle EMG demonstrate signs of reinnervation.  In terms of the MRI he has a OCD lesion of the medial talar dome with surrounding edema.  This was also reviewed with the patient in detail and given lack of improvement with conservative treatment and based on our discussion of risk benefits alternatives to arthroscopic ankle surgery with microfracture patient has elected to proceed with surgery.  Questions encouraged and answered.  Follow-Up Instructions: Return for 1 week postop visit.   Orders:  No orders of the defined types were placed in this encounter.  No orders of the defined types were placed in this encounter.     Procedures: No procedures performed   Clinical Data: No additional findings.   Subjective: Chief Complaint  Patient presents with  . Right Ankle - Follow-up, Pain    [name] is following up today for MRI review and nerve conduction study review.  He continues to endorse pain and instability with standing and walking and frequent falling.  Nerve conduction study shows a chronic severe distal tibial nerve injury with continuity.   Review of Systems  Constitutional: Negative.   All other systems reviewed and are negative.    Objective: Vital Signs: There were no vitals taken for this visit.  Physical Exam Vitals and nursing note reviewed.  Constitutional:      Appearance: He is well-developed.  Pulmonary:     Effort: Pulmonary effort is normal.  Abdominal:     Palpations: Abdomen is soft.  Skin:  General: Skin is warm.  Neurological:     Mental Status: He is alert and oriented to person, place, and time.  Psychiatric:        Behavior: Behavior normal.        Thought Content: Thought content normal.        Judgment: Judgment normal.     Ortho Exam Right ankle exam shows no swelling.  Fully healed posttraumatic scar.  Moderate pain with ankle motion. Specialty Comments:  No specialty comments available.  Imaging: No results found.   PMFS History: Patient Active Problem List   Diagnosis Date Noted  . 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 Acute respiratory failure with hypoxia ([place])   . Polysubstance overdose 02/26/2019  . Laceration of Achilles tendon, right, initial encounter   . Shock ([place]) 08/28/2018  . Suicide attempt ([place])   . Benzodiazepine (tranquilizer) overdose, intentional self-harm, initial encounter ([place]) 06/13/2018  . Overdose of benzodiazepine, intentional self-harm, initial encounter ([place])   . Somnolence   . Schizophrenia, paranoid type ([place]) 08/19/2017  . Hypokalemia 04/28/2014  . Drug overdose 04/27/2014  . Schizoaffective disorder, bipolar type ([place]) 03/02/2014  . Anxiety state 03/02/2014  . Benzodiazepine dependence ([place]) 03/02/2014  . Chest pain 10/11/2013  . EKG abnormalities 10/11/2013  . Chronic back pain    Past Medical History:  Diagnosis Date  .  Asthma   . CHF (congestive heart failure) ([place])   . Chronic back pain   . COPD (chronic obstructive pulmonary disease) ([place])   . Knee pain, chronic   . Migraines   . Schizophrenia, schizo-affective ([place])     Family History  Problem Relation Age of Onset  . Hypertension Father     Past Surgical History:  Procedure Laterality Date  . KNEE SURGERY     four times  . WISDOM TOOTH EXTRACTION     Social History   Occupational History  . Occupation: on disability  Tobacco Use  . Smoking status: Current Every Day Smoker    Packs/day: 1.00    Years: 33.00    Pack years: 33.00     Types: Cigarettes  . Smokeless tobacco: Never Used  Substance and Sexual Activity  . Alcohol use: No    Comment: occ  . Drug use: No    Comment: Pt denied  . Sexual activity: Not Currently

## 2019-12-08 ENCOUNTER — Ambulatory Visit: Payer: Medicare Other | Attending: Internal Medicine

## 2019-12-08 DIAGNOSIS — Z23 Encounter for immunization: Secondary | ICD-10-CM

## 2019-12-08 NOTE — Progress Notes (Signed)
   Covid-19 Vaccination Clinic  Name:  [name]    MRN: [id number] DOB: [birth year]  12/08/2019  [name] was observed post Covid-19 immunization for 15 minutes without incident. He was provided with Vaccine Information Sheet and instruction to access the V-Safe system.   [name] was instructed to call 911 with any severe reactions post vaccine: Marland Kitchen Difficulty breathing  . Swelling of face and throat  . A fast heartbeat  . A bad rash all over body  . Dizziness and weakness   Immunizations Administered    Name Date Dose VIS Date Route   Pfizer COVID-19 Vaccine 12/08/2019 12:08 PM 0.3 mL 08/19/2019 Intramuscular   Manufacturer: [place]   Lot: [id number]   [place]: [id number]

## 2020-01-04 ENCOUNTER — Ambulatory Visit: Payer: Medicare Other | Attending: Internal Medicine

## 2020-01-04 DIAGNOSIS — Z23 Encounter for immunization: Secondary | ICD-10-CM

## 2020-01-04 NOTE — Progress Notes (Signed)
   Covid-19 Vaccination Clinic  Name:  [name]    MRN: [id number] DOB: 23-Nov-1970  01/04/2020  [name] was observed post Covid-19 immunization for 15 minutes without incident. He was provided with Vaccine Information Sheet and instruction to access the V-Safe system.   [name] was instructed to call 911 with any severe reactions post vaccine: Marland Kitchen Difficulty breathing  . Swelling of face and throat  . A fast heartbeat  . A bad rash all over body  . Dizziness and weakness   Immunizations Administered    Name Date Dose VIS Date Route   Pfizer COVID-19 Vaccine 01/04/2020  3:54 PM 0.3 mL 11/02/2018 Intramuscular   Manufacturer: [place]   Lot: [id number]   [place]: [id number]

## 2020-01-08 ENCOUNTER — Other Ambulatory Visit: Payer: Self-pay

## 2020-01-08 ENCOUNTER — Emergency Department (HOSPITAL_COMMUNITY)
Admission: EM | Admit: 2020-01-08 | Discharge: 2020-01-08 | Disposition: A | Discharge status: Home / Self Care | Payer: Medicare Other | Attending: Emergency Medicine | Admitting: Emergency Medicine

## 2020-01-08 ENCOUNTER — Encounter (HOSPITAL_COMMUNITY): Payer: Self-pay | Admitting: Emergency Medicine

## 2020-01-08 DIAGNOSIS — Z5321 Procedure and treatment not carried out due to patient leaving prior to being seen by health care provider: Secondary | ICD-10-CM | POA: Diagnosis not present

## 2020-01-08 DIAGNOSIS — M545 Low back pain: Secondary | ICD-10-CM | POA: Diagnosis present

## 2020-01-08 DIAGNOSIS — R1110 Vomiting, unspecified: Secondary | ICD-10-CM | POA: Diagnosis not present

## 2020-01-08 DIAGNOSIS — R1030 Lower abdominal pain, unspecified: Secondary | ICD-10-CM | POA: Insufficient documentation

## 2020-01-08 LAB — CBC
HCT: 45.7 % (ref 39.0–52.0)
Hemoglobin: 14.9 g/dL (ref 13.0–17.0)
MCH: 29.3 pg (ref 26.0–34.0)
MCHC: 32.6 g/dL (ref 30.0–36.0)
MCV: 89.8 fL (ref 80.0–100.0)
Platelets: 192 10*3/uL (ref 150–400)
RBC: 5.09 MIL/uL (ref 4.22–5.81)
RDW: 12.6 % (ref 11.5–15.5)
WBC: 7.2 10*3/uL (ref 4.0–10.5)
nRBC: 0 % (ref 0.0–0.2)

## 2020-01-08 LAB — COMPREHENSIVE METABOLIC PANEL
ALT: 32 U/L (ref 0–44)
AST: 39 U/L (ref 15–41)
Albumin: 3.9 g/dL (ref 3.5–5.0)
Alkaline Phosphatase: 53 U/L (ref 38–126)
Anion gap: 10 (ref 5–15)
BUN: 9 mg/dL (ref 6–20)
CO2: 24 mmol/L (ref 22–32)
Calcium: 9.4 mg/dL (ref 8.9–10.3)
Chloride: 105 mmol/L (ref 98–111)
Creatinine, Ser: 0.77 mg/dL (ref 0.61–1.24)
GFR calc Af Amer: >60 mL/min (ref >60)
GFR calc non Af Amer: >60 mL/min (ref >60)
Glucose, Bld: 111 mg/dL — ABNORMAL HIGH (ref 70–99)
Potassium: 4 mmol/L (ref 3.5–5.1)
Sodium: 139 mmol/L (ref 135–145)
Total Bilirubin: 0.7 mg/dL (ref 0.3–1.2)
Total Protein: 6.8 g/dL (ref 6.5–8.1)

## 2020-01-08 LAB — LIPASE, BLOOD: Lipase: 29 U/L (ref 11–51)

## 2020-01-08 MED ORDER — SODIUM CHLORIDE 0.9% FLUSH: 3.0000 mL | Freq: Once | INTRAVENOUS | Status: DC

## 2020-01-08 NOTE — ED Triage Notes (Signed)
Pt c/o chronic back pain and lower abdominal pain with one episode of vomiting.

## 2020-01-08 NOTE — ED Notes (Signed)
Pt LWBS. Pt was tired of waiting.

## 2020-01-15 ENCOUNTER — Other Ambulatory Visit: Payer: Self-pay

## 2020-01-15 ENCOUNTER — Encounter (HOSPITAL_COMMUNITY): Payer: Self-pay | Admitting: Emergency Medicine

## 2020-01-15 ENCOUNTER — Emergency Department (HOSPITAL_COMMUNITY)
Admission: EM | Admit: 2020-01-15 | Discharge: 2020-01-16 | Disposition: A | Discharge status: Other Acute Inpatient Hospital | Payer: Medicare Other | Source: Home / Self Care | Attending: Emergency Medicine | Admitting: Emergency Medicine

## 2020-01-15 DIAGNOSIS — J449 Chronic obstructive pulmonary disease, unspecified: Secondary | ICD-10-CM | POA: Insufficient documentation

## 2020-01-15 DIAGNOSIS — F331 Major depressive disorder, recurrent, moderate: Secondary | ICD-10-CM | POA: Insufficient documentation

## 2020-01-15 DIAGNOSIS — F17210 Nicotine dependence, cigarettes, uncomplicated: Secondary | ICD-10-CM | POA: Insufficient documentation

## 2020-01-15 DIAGNOSIS — Z20822 Contact with and (suspected) exposure to covid-19: Secondary | ICD-10-CM | POA: Insufficient documentation

## 2020-01-15 DIAGNOSIS — Z79899 Other long term (current) drug therapy: Secondary | ICD-10-CM | POA: Insufficient documentation

## 2020-01-15 DIAGNOSIS — R45851 Suicidal ideations: Secondary | ICD-10-CM | POA: Insufficient documentation

## 2020-01-15 DIAGNOSIS — F200 Paranoid schizophrenia: Secondary | ICD-10-CM | POA: Diagnosis not present

## 2020-01-15 DIAGNOSIS — Z046 Encounter for general psychiatric examination, requested by authority: Secondary | ICD-10-CM

## 2020-01-15 DIAGNOSIS — R45850 Homicidal ideations: Secondary | ICD-10-CM | POA: Insufficient documentation

## 2020-01-15 NOTE — ED Triage Notes (Signed)
Pt brought to ED by [employer] for been IVC by family, pt flush all his medication, having SI and HI.

## 2020-01-15 NOTE — ED Notes (Signed)
Pt changed into purple scrubs and has put all his belongings in a personal belongings bag. [facility] [employer] has been called to wand pt.

## 2020-01-15 NOTE — ED Notes (Signed)
Pt has been wanded by [employer].

## 2020-01-16 ENCOUNTER — Encounter (HOSPITAL_COMMUNITY): Payer: Self-pay | Admitting: Behavioral Health

## 2020-01-16 ENCOUNTER — Inpatient Hospital Stay (HOSPITAL_COMMUNITY)
Admission: AD | Admit: 2020-01-16 | Discharge: 2020-02-02 | DRG: 885 | Disposition: A | Discharge status: Home / Self Care | Payer: Medicare Other | Attending: Psychiatry | Admitting: Psychiatry

## 2020-01-16 DIAGNOSIS — F17200 Nicotine dependence, unspecified, uncomplicated: Secondary | ICD-10-CM | POA: Diagnosis present

## 2020-01-16 DIAGNOSIS — J449 Chronic obstructive pulmonary disease, unspecified: Secondary | ICD-10-CM | POA: Diagnosis present

## 2020-01-16 DIAGNOSIS — R45851 Suicidal ideations: Secondary | ICD-10-CM | POA: Diagnosis present

## 2020-01-16 DIAGNOSIS — Z915 Personal history of self-harm: Secondary | ICD-10-CM

## 2020-01-16 DIAGNOSIS — F332 Major depressive disorder, recurrent severe without psychotic features: Secondary | ICD-10-CM | POA: Insufficient documentation

## 2020-01-16 DIAGNOSIS — Z20822 Contact with and (suspected) exposure to covid-19: Secondary | ICD-10-CM | POA: Diagnosis present

## 2020-01-16 DIAGNOSIS — Z8249 Family history of ischemic heart disease and other diseases of the circulatory system: Secondary | ICD-10-CM

## 2020-01-16 DIAGNOSIS — I509 Heart failure, unspecified: Secondary | ICD-10-CM | POA: Diagnosis present

## 2020-01-16 DIAGNOSIS — F200 Paranoid schizophrenia: Principal | ICD-10-CM | POA: Diagnosis present

## 2020-01-16 DIAGNOSIS — Z79899 Other long term (current) drug therapy: Secondary | ICD-10-CM

## 2020-01-16 DIAGNOSIS — F411 Generalized anxiety disorder: Secondary | ICD-10-CM | POA: Diagnosis present

## 2020-01-16 DIAGNOSIS — G8929 Other chronic pain: Secondary | ICD-10-CM | POA: Diagnosis present

## 2020-01-16 DIAGNOSIS — R45850 Homicidal ideations: Secondary | ICD-10-CM | POA: Diagnosis present

## 2020-01-16 DIAGNOSIS — Z9114 Patient's other noncompliance with medication regimen: Secondary | ICD-10-CM | POA: Diagnosis not present

## 2020-01-16 LAB — COMPREHENSIVE METABOLIC PANEL
ALT: 28 U/L (ref 0–44)
AST: 22 U/L (ref 15–41)
Albumin: 4 g/dL (ref 3.5–5.0)
Alkaline Phosphatase: 59 U/L (ref 38–126)
Anion gap: 11 (ref 5–15)
BUN: 11 mg/dL (ref 6–20)
CO2: 26 mmol/L (ref 22–32)
Calcium: 9.7 mg/dL (ref 8.9–10.3)
Chloride: 106 mmol/L (ref 98–111)
Creatinine, Ser: 1.1 mg/dL (ref 0.61–1.24)
GFR calc Af Amer: >60 mL/min (ref >60)
GFR calc non Af Amer: >60 mL/min (ref >60)
Glucose, Bld: 112 mg/dL — ABNORMAL HIGH (ref 70–99)
Potassium: 3.6 mmol/L (ref 3.5–5.1)
Sodium: 143 mmol/L (ref 135–145)
Total Bilirubin: 0.7 mg/dL (ref 0.3–1.2)
Total Protein: 6.8 g/dL (ref 6.5–8.1)

## 2020-01-16 LAB — CBC
HCT: 47.5 % (ref 39.0–52.0)
Hemoglobin: 15.2 g/dL (ref 13.0–17.0)
MCH: 29.2 pg (ref 26.0–34.0)
MCHC: 32 g/dL (ref 30.0–36.0)
MCV: 91.2 fL (ref 80.0–100.0)
Platelets: 274 10*3/uL (ref 150–400)
RBC: 5.21 MIL/uL (ref 4.22–5.81)
RDW: 12.8 % (ref 11.5–15.5)
WBC: 9.5 10*3/uL (ref 4.0–10.5)
nRBC: 0 % (ref 0.0–0.2)

## 2020-01-16 LAB — RESPIRATORY PANEL BY RT PCR (FLU A&B, COVID)
Influenza A by PCR: NEGATIVE
Influenza B by PCR: NEGATIVE
SARS Coronavirus 2 by RT PCR: NEGATIVE

## 2020-01-16 LAB — RAPID URINE DRUG SCREEN, HOSP PERFORMED
Amphetamines: NOT DETECTED
Barbiturates: NOT DETECTED
Benzodiazepines: POSITIVE — AB
Cocaine: NOT DETECTED
Opiates: NOT DETECTED
Tetrahydrocannabinol: POSITIVE — AB

## 2020-01-16 LAB — ETHANOL: Alcohol, Ethyl (B): <10 mg/dL (ref <10)

## 2020-01-16 LAB — SALICYLATE LEVEL: Salicylate Lvl: <7 mg/dL — ABNORMAL LOW (ref 7.0–30.0)

## 2020-01-16 LAB — ACETAMINOPHEN LEVEL: Acetaminophen (Tylenol), Serum: 27 ug/mL (ref 10–30)

## 2020-01-16 MED ORDER — GABAPENTIN 100 MG PO CAPS
100.0000 mg | ORAL_CAPSULE | Freq: Three times a day (TID) | ORAL | Status: DC
  Administered 2020-01-16 – 2020-01-17 (×2): 100 mg via ORAL
  Filled 2020-01-16 (×6): qty 1

## 2020-01-16 MED ORDER — ALBUTEROL SULFATE HFA 108 (90 BASE) MCG/ACT IN AERS: 2.0000 | INHALATION_SPRAY | RESPIRATORY_TRACT | Status: DC | PRN

## 2020-01-16 MED ORDER — ALPRAZOLAM 0.5 MG PO TABS
1.0000 mg | ORAL_TABLET | Freq: Three times a day (TID) | ORAL | Status: DC | PRN
  Administered 2020-01-17: 1 mg via ORAL
  Filled 2020-01-16 (×2): qty 2

## 2020-01-16 MED ORDER — OLANZAPINE 10 MG PO TABS
40.0000 mg | ORAL_TABLET | Freq: Every day | ORAL | Status: DC
  Administered 2020-01-16: 40 mg via ORAL
  Filled 2020-01-16 (×3): qty 4

## 2020-01-16 MED ORDER — ALPRAZOLAM 0.5 MG PO TABS
1.0000 mg | ORAL_TABLET | Freq: Three times a day (TID) | ORAL | Status: DC | PRN
  Administered 2020-01-16: 1 mg via ORAL
  Filled 2020-01-16: qty 2

## 2020-01-16 MED ORDER — OLANZAPINE 10 MG PO TABS: 40.0000 mg | ORAL_TABLET | Freq: Every day | ORAL | Status: DC

## 2020-01-16 MED ORDER — PERPHENAZINE 4 MG PO TABS: 4.0000 mg | ORAL_TABLET | Freq: Two times a day (BID) | ORAL | Status: DC

## 2020-01-16 MED ORDER — FLUPHENAZINE HCL 10 MG PO TABS
10.0000 mg | ORAL_TABLET | Freq: Every day | ORAL | Status: DC
  Administered 2020-01-16: 10 mg via ORAL
  Filled 2020-01-16: qty 2
  Filled 2020-01-16 (×2): qty 1

## 2020-01-16 MED ORDER — PERPHENAZINE 4 MG PO TABS
4.0000 mg | ORAL_TABLET | Freq: Two times a day (BID) | ORAL | Status: DC
  Administered 2020-01-16 – 2020-01-17 (×2): 4 mg via ORAL
  Filled 2020-01-16 (×5): qty 1

## 2020-01-16 MED ORDER — ACETAMINOPHEN 325 MG PO TABS
650.0000 mg | ORAL_TABLET | Freq: Once | ORAL | Status: AC
  Administered 2020-01-16: 15:00:00 650 mg via ORAL
  Filled 2020-01-16: qty 2

## 2020-01-16 MED ORDER — FLUPHENAZINE HCL 10 MG PO TABS
10.0000 mg | ORAL_TABLET | Freq: Every day | ORAL | Status: DC
  Filled 2020-01-16: qty 1

## 2020-01-16 NOTE — ED Notes (Signed)
Pharm Tech to perform Med Rec when pt awakens. Will hold Trilafon at this time d/t pt has not had filled in a while so need to verify first.

## 2020-01-16 NOTE — Progress Notes (Signed)
Admission Note: Patient is a 49 year old male admitted to the unit for symptoms of psychosis, depression and medication noncompliance. Denies suicidal ideation, auditory and visual hallucination.   Patient is alert and oriented to person, time and place.  Presents with irritable affect and mood.  Refused to participate with admission process.  States son and his mother lied on him to get him into the hospital.  Patient demanding and asking for his Norco and Xanax. Skin assessment and personal belongings completed.  Patient oriented to the unit, staff and room.  Routine safety checks initiated.  Patient observed talking to himself and responding while in his room.  Patient is safe on the unit.

## 2020-01-16 NOTE — ED Notes (Addendum)
Pt asking for CT's and MRI's of his whole body "because I need to be medically cleared". Re-attempted to call report.
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
## 2020-01-16 NOTE — Progress Notes (Signed)
[name] Romp, NP recommends continued observation for safety and stabilization and to be reassessed in the AM by psych. EDP Muthersbaugh, Gwenlyn Perking and Barry Brunner, RN have been advised.

## 2020-01-16 NOTE — ED Notes (Signed)
Staffing contacted for sitter, no sitter available at this time. [employer] at the bedside.

## 2020-01-16 NOTE — ED Notes (Signed)
[employer] relieved by staff

## 2020-01-16 NOTE — ED Notes (Signed)
Pt called for nurse. This RN resonded Pt states he was given "release orders by [facility]. This RN explained he is not ready to be discharged at this time. Pt agitated and wanting to go outside to smoke. This RN offered a nicotine patch but pt refused stating he is allergic to nicotine. When asked how he smokes if allergic to nicotine, pt respond, my smokes don't have nicotine in them. Pt also states his son has put him here and his son is a Advertising account planner. Pt has eaten and provided warm blankets for comfort.  Sitter at bedside.

## 2020-01-16 NOTE — ED Provider Notes (Signed)
[place] [facility] EMERGENCY DEPARTMENT Provider Note   CSN: [id number] Arrival date & time: 01/15/20  2318     History Chief Complaint  Patient presents with  . Medical Clearance    IVC    [name] is a 49 y.o. male with a hx of asthma, CHF, COPD, migraines, schizophrenia, previous overdose and suicide attempt presents to the Emergency Department via [facility] under IVC for suicidal and homicidal ideation.  On my exam patient is without complaint and denies homicidal or suicidal ideation.  Patient denies auditory or visual hallucinations.  He does report he has not been taking his medications.  No specific aggravating or alleviating factors.   IVC completed by patient's son.  It states" " Respondent has been diagnosed with schizophrenia. -He flushed his meds down the toilet. -Today, the respondent stated he was contemplating suicide. -Respondent is very aggressive.  He threatened his family and neighbors because the voices in his head told him they were out to get him. -He also threatened to bomb the act team. -He claims to hear voices and constantly he talks/yells back at them."  Records reviewed.  Patient did have a drug overdose in December 2019.  No behavioral health admissions in epic.  The history is provided by the patient and medical records. No language interpreter was used.       Past Medical History:  Diagnosis Date  . Asthma   . CHF (congestive heart failure) ([place])   . Chronic back pain   . COPD (chronic obstructive pulmonary disease) ([place])   . Knee pain, chronic   . Migraines   . Schizophrenia, schizo-affective [facility])     Patient Active Problem List   Diagnosis Date Noted  . 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 Acute respiratory failure with hypoxia ([place])   . Polysubstance overdose 02/26/2019  . Laceration of Achilles tendon, right, initial encounter   . Shock ([place]) 08/28/2018  . Suicide attempt ([place])   . Benzodiazepine (tranquilizer)  overdose, intentional self-harm, initial encounter ([place]) 06/13/2018  . Overdose of benzodiazepine, intentional self-harm, initial encounter ([place])   . Somnolence   . Schizophrenia, paranoid type ([place]) 08/19/2017  . Hypokalemia 04/28/2014  . Drug overdose 04/27/2014  . Schizoaffective disorder, bipolar type ([place]) 03/02/2014  . Anxiety state 03/02/2014  . Benzodiazepine dependence ([place]) 03/02/2014  . Chest pain 10/11/2013  . EKG abnormalities 10/11/2013  . Chronic back pain     Past Surgical History:  Procedure Laterality Date  . KNEE SURGERY     four times  . WISDOM TOOTH EXTRACTION         Family History  Problem Relation Age of Onset  . Hypertension Father     Social History   Tobacco Use  . Smoking status: Current Every Day Smoker    Packs/day: 1.00    Years: 33.00    Pack years: 33.00    Types: Cigarettes  . Smokeless tobacco: Never Used  Substance Use Topics  . Alcohol use: No    Comment: occ  . Drug use: No    Comment: Pt denied    Home Medications Prior to Admission medications   Medication Sig Start Date End Date Taking? Authorizing Provider  acetaminophen (TYLENOL) 325 MG tablet Take 2 tablets (650 mg total) by mouth every 6 (six) hours as needed for mild pain, fever or headache. 09/06/18   Debbe Odea, MD  acetaminophen (TYLENOL) 500 MG tablet Take 1,000 mg by mouth every 6 (six) hours as needed  for moderate pain.    [provider]  albuterol (VENTOLIN HFA) 108 (90 Base) MCG/ACT inhaler Inhale 2 puffs into the lungs every 4 (four) hours as needed for wheezing or shortness of breath. 09/02/19   Varney Biles, MD  ALPRAZolam [name]) 1 MG tablet Take 1 mg by mouth 4 (four) times daily.    [provider]  azithromycin (ZITHROMAX) 250 MG tablet Take 1 tablet (250 mg total) by mouth daily. Take first 2 tablets together, then 1 every day until finished. 09/02/19   Varney Biles, MD  diclofenac sodium (VOLTAREN) 1 % GEL Apply 4 g topically  4 (four) times daily as needed. 10/22/18   Hilts, Legrand Como, MD  fluPHENAZine (PROLIXIN) 10 MG tablet Take 10 mg by mouth at bedtime. 08/30/19   [provider]  HYDROcodone-acetaminophen (NORCO) 10-325 MG tablet Take 1-2 tablets by mouth every 6 (six) hours as needed for moderate pain.    [provider]  mupirocin ointment (BACTROBAN) 2 % Place 1 application into the nose 2 (two) times daily. 10/22/18   Hilts, Legrand Como, MD  OLANZapine (ZYPREXA) 10 MG tablet Take 40 mg by mouth at bedtime.  07/30/18   [provider]  perphenazine (TRILAFON) 4 MG tablet Take 1 tablet (4 mg total) by mouth 2 (two) times daily. 09/06/18   Debbe Odea, MD  potassium chloride SA (KLOR-CON) 20 MEQ tablet Take 1 tablet (20 mEq total) by mouth 2 (two) times daily. [id number]   Delora Fuel, MD    Allergies    Amoxicillin, Dextromethorphan-guaifenesin, Haloperidol, Haloperidol lactate, Meloxicam, Nsaids, Quetiapine, Sudafed [pseudoephedrine hcl], Ziprasidone, and Prednisone  Review of Systems   Review of Systems  Constitutional: Negative for appetite change, diaphoresis, fatigue, fever and unexpected weight change.  HENT: Negative for mouth sores.   Eyes: Negative for visual disturbance.  Respiratory: Negative for cough, chest tightness, shortness of breath and wheezing.   Cardiovascular: Negative for chest pain.  Gastrointestinal: Negative for abdominal pain, constipation, diarrhea, nausea and vomiting.  Endocrine: Negative for polydipsia, polyphagia and polyuria.  Genitourinary: Negative for dysuria, frequency, hematuria and urgency.  Musculoskeletal: Negative for back pain and neck stiffness.  Skin: Negative for rash.  Allergic/Immunologic: Negative for immunocompromised state.  Neurological: Negative for syncope, light-headedness and headaches.  Hematological: Does not bruise/bleed easily.  Psychiatric/Behavioral: Negative for sleep disturbance. The patient is not nervous/anxious.      Physical Exam Updated Vital Signs BP 109/72 (BP Location: Right Arm)   Pulse 88   Temp 98.8 F (37.1 C) (Oral)   Resp 18   Ht 6\' 1"  (1.854 m)   Wt 86.6 kg   SpO2 97%   BMI 25.19 kg/m   Physical Exam Vitals and nursing note reviewed.  Constitutional:      General: He is not in acute distress.    Appearance: He is well-developed.  HENT:     Head: Normocephalic.     Mouth/Throat:     Mouth: Mucous membranes are moist.  Eyes:     General: No scleral icterus.    Conjunctiva/sclera: Conjunctivae normal.  Cardiovascular:     Rate and Rhythm: Normal rate and regular rhythm.  Pulmonary:     Effort: Pulmonary effort is normal.     Breath sounds: Normal breath sounds.  Abdominal:     General: Abdomen is flat. There is no distension.     Palpations: Abdomen is soft.     Tenderness: There is no abdominal tenderness.  Musculoskeletal:  General: Normal range of motion.     Cervical back: Normal range of motion.  Skin:    General: Skin is warm and dry.     Capillary Refill: Capillary refill takes less than 2 seconds.  Neurological:     Mental Status: He is alert.  Psychiatric:        Attention and Perception: He does not perceive auditory or visual hallucinations.        Thought Content: Thought content does not include homicidal or suicidal ideation. Thought content does not include homicidal or suicidal plan.     Comments: Patient denies SI, HI, AVH -he is not actively responding to external stimuli here in the emergency department.     ED Results / Procedures / Treatments   Labs (all labs ordered are listed, but only abnormal results are displayed) Labs Reviewed  COMPREHENSIVE METABOLIC PANEL - Abnormal; Notable for the following components:      Result Value   Glucose, Bld 112 (*)    All other components within normal limits  SALICYLATE LEVEL - Abnormal; Notable for the following components:   Salicylate Lvl [id number] (*)    All other components within normal  limits  RESPIRATORY PANEL BY RT PCR (FLU A&B, COVID)  ETHANOL  ACETAMINOPHEN LEVEL  CBC  RAPID URINE DRUG SCREEN, HOSP PERFORMED    EKG None  Radiology No results found.  Procedures Procedures (including critical care time)  Medications Ordered in ED Medications - No data to display  ED Course  I have reviewed the triage vital signs and the nursing notes.  Pertinent labs & imaging results that were available during my care of the patient were reviewed by me and considered in my medical decision making (see chart for details).    MDM Rules/Calculators/A&P                      Patient presents emergency department under IVC for suicidal ideation, homicidal ideation, auditory and visual hallucinations.  He denies all these things to me.  Labs reassuring.  Vital signs within normal limits.  At this time there is no medical emergency which requires emergent intervention.  Will be evaluated by TTS.  Will hold on first exam until TTS evaluation as patient is denying all parts of IVC statements.  5:43 AM Pt recommended for AM re-evaluation by Psyc. First exam completed.   Final Clinical Impression(s) / ED Diagnoses Final diagnoses:  Involuntary commitment    Rx / DC Orders ED Discharge Orders    None       Michel Hendon, Gwenlyn Perking 01/16/20 [id number]    Ezequiel Essex, MD 01/16/20 [phone]

## 2020-01-16 NOTE — BH Assessment (Signed)
[facility] Assessment Progress Note  Per Mordecai Maes, NP, this pt requires psychiatric hospitalization.  [facility] has assigned pt to [facility]; [facility] will be ready to receive pt at 15:00.  Pt presents under IVC.   Pt's nurse, Loletta Parish, has been notified, and agrees to call report to [phone].  Pt is to be transported via Event [facility].  Jalene Mullet, [place] Coordinator [phone]

## 2020-01-16 NOTE — ED Notes (Addendum)
Pt's son - Altamese Cabal - aware pt has been accepted to [facility] and is being transported at this time. ALL Belongings - 2 labeled bags - [employer] - Verified No Valuables w/Security. Pt aware.

## 2020-01-16 NOTE — ED Notes (Signed)
Attempted to call report to [facility] - RN to return call. Pt now awake - c/o chronic back pain - states he goes to pain clinic and missed his appt this AM d/t was IVC'd by his son. States takes Hydrocodone 10mg . Pt voiced agreement to take Tylenol. Pt eating lunch .

## 2020-01-16 NOTE — Tx Team (Signed)
Initial Treatment Plan 01/16/2020 5:47 PM [name] [id number]    PATIENT STRESSORS: Financial difficulties Health problems Legal issue Medication change or noncompliance Substance abuse   PATIENT STRENGTHS: Supportive family/friends   PATIENT IDENTIFIED PROBLEMS: Depression  Psychosis  Medication noncompliance                 DISCHARGE CRITERIA:  Adequate post-discharge living arrangements Medical problems require only outpatient monitoring  PRELIMINARY DISCHARGE PLAN: Attend aftercare/continuing care group Outpatient therapy Return to previous living arrangement  PATIENT/FAMILY INVOLVEMENT: This treatment plan has been presented to and reviewed with the patient, [name], and/or family member.  The patient and family have been given the opportunity to ask questions and make suggestions.  Vela Prose, RN 01/16/2020, 5:47 PM

## 2020-01-16 NOTE — ED Notes (Signed)
[facility] contacted this RN regarding TTS for pt. [facility] advised that pt currently in hallway awaiting room placement, advised that staff will call [facility] when pt in room to be assessed.
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
## 2020-01-16 NOTE — ED Notes (Signed)
Pt arrived to [facility] - ambulatory - wearing burgundy scrubs - Pt noted to be irritable then laid down on stretcher and noted to be sleeping. Sitter w/pt.
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
## 2020-01-16 NOTE — ED Notes (Signed)
Pt on phone at nurses' desk. 

## 2020-01-17 DIAGNOSIS — F200 Paranoid schizophrenia: Principal | ICD-10-CM

## 2020-01-17 LAB — LIPID PANEL
Cholesterol: 154 mg/dL (ref 0–200)
HDL: 33 mg/dL — ABNORMAL LOW (ref >40)
LDL Cholesterol: 84 mg/dL (ref 0–99)
Total CHOL/HDL Ratio: 4.7 RATIO
Triglycerides: 186 mg/dL — ABNORMAL HIGH (ref <150)
VLDL: 37 mg/dL (ref 0–40)

## 2020-01-17 LAB — TSH: TSH: 1.301 u[IU]/mL (ref 0.350–4.500)

## 2020-01-17 MED ORDER — OLANZAPINE 10 MG PO TABS
20.0000 mg | ORAL_TABLET | Freq: Every day | ORAL | Status: DC
  Administered 2020-01-17: 20 mg via ORAL
  Filled 2020-01-17 (×2): qty 2

## 2020-01-17 MED ORDER — ZIPRASIDONE MESYLATE 20 MG IM SOLR
20.0000 mg | INTRAMUSCULAR | Status: AC | PRN
  Administered 2020-01-18: 20 mg via INTRAMUSCULAR
  Filled 2020-01-17: qty 20

## 2020-01-17 MED ORDER — QUETIAPINE FUMARATE 25 MG PO TABS
25.0000 mg | ORAL_TABLET | Freq: Two times a day (BID) | ORAL | Status: DC
  Administered 2020-01-17 – 2020-01-18 (×3): 25 mg via ORAL
  Filled 2020-01-17 (×8): qty 1

## 2020-01-17 MED ORDER — LIDOCAINE 5 % EX PTCH
1.0000 | MEDICATED_PATCH | CUTANEOUS | Status: DC
  Administered 2020-01-17 – 2020-02-01 (×13): 1 via TRANSDERMAL
  Filled 2020-01-17 (×22): qty 1

## 2020-01-17 MED ORDER — ALPRAZOLAM 0.5 MG PO TABS
1.0000 mg | ORAL_TABLET | Freq: Two times a day (BID) | ORAL | Status: DC
  Administered 2020-01-17 – 2020-01-18 (×2): 1 mg via ORAL
  Filled 2020-01-17 (×2): qty 2

## 2020-01-17 MED ORDER — OLANZAPINE 10 MG PO TBDP
10.0000 mg | ORAL_TABLET | Freq: Three times a day (TID) | ORAL | Status: DC | PRN
  Administered 2020-01-18 (×2): 10 mg via ORAL
  Filled 2020-01-17 (×3): qty 1

## 2020-01-17 MED ORDER — ALPRAZOLAM 0.5 MG PO TABS
0.5000 mg | ORAL_TABLET | Freq: Two times a day (BID) | ORAL | Status: DC | PRN
  Administered 2020-01-17: 0.5 mg via ORAL
  Filled 2020-01-17: qty 1

## 2020-01-17 MED ORDER — ACETAMINOPHEN 500 MG PO TABS
1000.0000 mg | ORAL_TABLET | Freq: Four times a day (QID) | ORAL | Status: DC | PRN
  Administered 2020-01-17 – 2020-01-18 (×3): 1000 mg via ORAL
  Filled 2020-01-17 (×3): qty 2

## 2020-01-17 MED ORDER — LORAZEPAM 1 MG PO TABS
1.0000 mg | ORAL_TABLET | ORAL | Status: AC | PRN
  Administered 2020-01-17: 1 mg via ORAL
  Filled 2020-01-17: qty 1

## 2020-01-17 NOTE — Progress Notes (Signed)
Pt in room with room closed yelling that they are in pain. Pt yelling obscenities and yelling to staff that they will not keep quiet and that they do not need to be here. Pt in room yelling "Help!!!". Pt has taken tylenol and has received heat packs from their nurse.

## 2020-01-17 NOTE — Progress Notes (Signed)
   01/17/20 0815  Psych Admission Type (Psych Patients Only)  Admission Status Involuntary  Psychosocial Assessment  Patient Complaints Agitation;Anxiety;Irritability;Restlessness  Eye Contact Brief  Facial Expression Anxious  Affect Anxious;Irritable  Speech Loud  Interaction Assertive;Demanding  Motor Activity Slow  Appearance/Hygiene Unremarkable  Behavior Characteristics Agitated;Anxious  Mood Anxious;Labile  Thought Process  Coherency Disorganized;Tangential;Loose associations  Content Blaming others  Delusions Paranoid  Perception Hallucinations  Hallucination Auditory  Judgment Impaired  Confusion UTA  Danger to Self  Current suicidal ideation? Denies  Danger to Others  Danger to Others None reported or observed

## 2020-01-17 NOTE — BHH Suicide Risk Assessment (Addendum)
[facility] Admission Suicide Risk Assessment   Nursing information obtained from:  Patient Demographic factors:  Low socioeconomic status Current Mental Status:  NA Loss Factors:  Decline in physical health, Financial problems / change in socioeconomic status Historical Factors:  NA Risk Reduction Factors:  Living with another person, especially a relative  Total Time spent with patient: 45 minutes Principal Problem:  Schizoaffective Disorder by history  Diagnosis:  Active Problems:   MDD (major depressive disorder), recurrent severe, without psychosis ([place])  Subjective Data:   Continued Clinical Symptoms:  Alcohol Use Disorder Identification Test Final Score (AUDIT): 0 The "Alcohol Use Disorders Identification Test", Guidelines for Use in Primary Care, Second Edition.  World Pharmacologist [facility]). Score between 0-7:  no or low risk or alcohol related problems. Score between 8-15:  moderate risk of alcohol related problems. Score between 16-19:  high risk of alcohol related problems. Score 20 or above:  warrants further diagnostic evaluation for alcohol dependence and treatment.   CLINICAL FACTORS:  49 year old male, has an 85 year old son, lives alone, on [place].  He presented to ED under IVC . As per IVC ""has been diagnosed with schizophrenia. He flushed his meds down the toilet. Today, the respondent stated his was contemplating suicide. Respondent is very aggressive. He threatened his family and neighbors because the voices in his head told him they were out to get him. He also threatened to bomb the ACT team. He claims to hear voices and constantly he talks/yells back at them."  Patient presents as a fair historian and provides limited information at this time.  Thought process is disorganized and tangential . He acknowledges he has not been taking his psychiatric medications, and  has been experiencing auditory hallucinations. He presents irritable and states " I am tired of being  imprisoned ". Tends to focus on chronic pain, and describes chronic back and neck pain.   He reports he has been diagnosed with Schizoaffective Disorder in the past . History of prior psychiatric admissions , most recently about a year and a half ago. At the time he was diagnosed with Schizoaffective Disorder and discharged on [place],  Reports history of multiple suicide attempts , most recently 11/2018 He is followed by an ACT team . He is known to [facility] from a prior admission in 2018 for psychosis, . Denies alcohol or drug abuse . Admission UDS  (+) for BZDs and Cannabis, negative for opiates. BAL negative. Medical History- reports chronic lower back and neck pain.  Home medications-Xanax 1 mgr TID, Hydrocodone 10 mgrs Q 6 hours , Zyprexa 40 mgrs QHS, Trilafon 4 mgrs BID, Seroquel 75 mgrs QDAY. As noted, report is that he has been non compliant with medications .  Dx- Schizoaffective Disorder  Plan- inpatient admission .  Upon review, on patient's most recent psychiatric admission in 11/2018 he was stabilized on Seroquel/Zyprexa Trilafon was discontinued at the time as felt to be less effective . Will resume Zyprexa at 20 mgrs QDAY initially/ Seroquel 25 mgrs BID. He has tolerated this combination well in the past.  Prior to admission was prescribed Xanax 1 mgr TID. Will initiate taper. At this time decrease dose to 1 mgr BID. ( and 0.5 mgrs PRN as needed for anxiety)  *Currently he is not presenting with symptoms of WDL, vitals are stable. Agitation Protocol for acute agitation as needed . Of note, patient denies being allergic to either Seroquel or Geodon, and states he has been on these before .  States that he says " I am allergic to things when I don't want to take them , but I have no allergies to psychiatric medications".  Routine labs , including EKG to monitor QTc     Musculoskeletal: Strength & Muscle Tone: within normal limits Gait & Station: normal Patient leans: N/A  Psychiatric  Specialty Exam: Physical Exam  Review of Systems no headache, no chest pain, no shortness of breath, no vomiting , reports chronic lower back and neck pain  Blood pressure 128/90, pulse 95, temperature 98.8 F (37.1 C), temperature source Oral, resp. rate 16, height 6\' 1"  (1.854 m), weight 85.7 kg, SpO2 98 %.Body mass index is 24.94 kg/m.  General Appearance: Fairly Groomed  Eye Contact:  Fair  Speech:  Normal Rate  Volume:  Normal  Mood:  Irritable  Affect:  Congruent  Thought Process:  Disorganized and Descriptions of Associations: Tangential  Orientation:  Other:  fully alert and attentive  Thought Content:  (+) auditory hallucinations  Suicidal Thoughts:  No denies suicidal ideations at this time  Homicidal Thoughts:  No  Memory:  recent and remote fair   Judgement:  Impaired  Insight:  Lacking  Psychomotor Activity:  Normal- no psychomotor agitation or restlessness noted at this time  Concentration:  Concentration: Fair and Attention Span: Fair  Recall:  [employer] of Knowledge:  Fair  Language:  Fair  Akathisia:  Negative  Handed:  Right  AIMS (if indicated):     Assets:  Communication Skills Desire for Improvement Resilience  ADL's:  Fair   Cognition:  WNL  Sleep:  Number of Hours: 5.75      COGNITIVE FEATURES THAT CONTRIBUTE TO RISK:  Closed-mindedness and Loss of executive function    SUICIDE RISK:   Moderate:  Frequent suicidal ideation with limited intensity, and duration, some specificity in terms of plans, no associated intent, good self-control, limited dysphoria/symptomatology, some risk factors present, and identifiable protective factors, including available and accessible social support.  PLAN OF CARE: Patient will be admitted to inpatient psychiatric unit for stabilization and safety. Will provide and encourage milieu participation. Provide medication management and maked adjustments as needed.  Will follow daily.    I certify that inpatient services  furnished can reasonably be expected to improve the patient's condition.   Jenne Campus, MD 01/17/2020, 9:46 AM

## 2020-01-17 NOTE — Progress Notes (Signed)
Recreation Therapy Notes  Date: 5.11.21 Time: 10:00 Location: [address] Dayroom   Group Topic: Leisure Education  Goal Area(s) Addresses:  Patient will identify positive leisure activities.  Patient will identify one positive benefit of participation in leisure activities.   Behavioral Response: Engaged  Intervention: Leisure [employer], Music  Activity: Keep It Chartered certified accountant.  Patients were seated in a circle.  Patients were to keep the ball in motion for as long as possible.  LRT would keep count of how many times the ball was hit within that time frame.  The ball could be bounced or rolled to the next person.  If the ball came to a complete stop, the count would start over.   Education:  Leisure Education, Dentist  Education Outcome: Acknowledges education/In group clarification offered/Needs additional education  Clinical Observations/Feedback: Pt actively participated in group activity.  Pt was also talking to himself throughout the group.  Pt was making some of his peers uncomfortable with his comments about the music that was playing in the background.  Pt was able to remain active during the activity.  Pt did stop to get water when he needed to.     Victorino Sparrow, LRT/CTRS    Victorino Sparrow A 01/17/2020 10:53 AM

## 2020-01-17 NOTE — Progress Notes (Signed)
Pt up at nurse's station c/o pain in his back. Provider notified. Given order for Tylenol.

## 2020-01-17 NOTE — H&P (Addendum)
Psychiatric Admission Assessment Adult  Patient Identification: [name] MRN:  [id number] Date of Evaluation:  01/17/2020 Chief Complaint:  MDD (major depressive disorder), recurrent severe, without psychosis ([place]) [F33.2] Principal Diagnosis: Schizophrenia, paranoid type ([place]) Diagnosis:   Patient Active Problem List   Diagnosis Date Noted  . MDD (major depressive disorder), recurrent severe, without psychosis ([place]) [F33.2] 01/16/2020  . 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 Acute respiratory failure with hypoxia ([place]) [J96.01]   . Polysubstance overdose [T50.901A] 02/26/2019  . Laceration of Achilles tendon, right, initial encounter [S50.539J]   . Shock ([place]) [R57.9] 08/28/2018  . Suicide attempt ([place]) [T14.91XA]   . Benzodiazepine (tranquilizer) overdose, intentional self-harm, initial encounter ([place]) [T42.4X2A] 06/13/2018  . Overdose of benzodiazepine, intentional self-harm, initial encounter ([place]) [T42.4X2A]   . Somnolence [R40.0]   . Schizophrenia, paranoid type ([place]) [F20.0] 08/19/2017  . Hypokalemia [E87.6] 04/28/2014  . Drug overdose [T50.901A] 04/27/2014  . Schizoaffective disorder, bipolar type ([place]) [F25.0] 03/02/2014  . Anxiety state [F41.1] 03/02/2014  . Benzodiazepine dependence ([place]) [F13.20] 03/02/2014  . Chest pain [R07.9] 10/11/2013  . EKG abnormalities [R94.31] 10/11/2013  . Chronic back pain [M54.9, G89.59]    ID: 49 year old male who lives alone, and is receiving disability benefits. He has 1 adult male son.   CC: I went to the ER for a workup and they brought me in here.   History of Present Illness: [name] is an 49 y.o. male who presents to the ED under IVC initiated by his son. Per IVC, respondent "has been diagnosed with schizophrenia. He flushed his meds down the toilet. Today, the respondent stated his was contemplating suicide. Respondent is very aggressive. He threatened his family and neighbors because the voices in his  head told him they were out to get him. He also threatened to bomb the ACT team. He claims to hear voices and constantly he talks/yells back at them." TTS attempted to contact the petitioner of the IVC [phone] in order to obtain collateral information but did not receive an answer. There was no option to leave a V/M.  Pt states he was angry and endorsed SI but denies any plan or intent. Pt states he has attempted suicide "at least 50 times before." Pt states he does not like the medication that he is prescribed and often tries to OD on the meds that he is given. Pt admits he did flush his medication in the toilet three weeks ago as stated on the IVC but he reports he did this because he does not like the way the medication makes him feel. Pt denies HI and denies AVH at present. Pt does admit to previous VH but denies any during the assessment. Pt has been admitted to inpt facilities in the past due to Paranoid Schizophrenia. Pt states he meets with his ACT team regularly.  Pt is alert and oriented x3 during the assessment. Pt speech is slurred. Pt eye contact is fair. Pt mood is ambivalent and despair. Pt affect is flat. Pt thought process is coherent and relevant. Pt does not present as anxious during the assessment. Pt responds to questions appropriately.   During the evaluation patient was calm and cooperative, alert and oriented. Patient presented under IVC due to history of schizophrenia and suicidal thoughts. He reports having inaudible voices. He states he is noncompliant with his medications, and flushed them down the toilet. He declines wanting help for his schizophrenia, and is requesting medications that will help "  benzodiazapine's and Roxi's." He presents with a flight of ideas and loose associations. Patient is a poor historian with bizarre thought processes who continues to ramble and difficult to redirect at times, he reports that he has lymphoma, pain is the driving force for his anger.  He is demanding his medications, PDMP confirmed active prescription of Alprazolam 47m po TID, and Hydrocodone/ASAP 10/325 po TID, UDS positive for BZD and THC. He denies suicidal ideations, homicidal ideations, and hallucinations.   Associated Signs/Symptoms: Depression Symptoms:  anxiety, disturbed sleep, (Hypo) Manic Symptoms:  Delusions, Distractibility, Grandiosity, Hallucinations, Impulsivity, Irritable Mood, Anxiety Symptoms:  Excessive Worry, Psychotic Symptoms:  Delusions, Hallucinations: Auditory Ideas of Reference, Paranoia, PTSD Symptoms: NA Total Time spent with patient: 1 hour  Past Psychiatric History: Schziophrenia with history of multiple inpatient admissions. Most recent attmept was 11/2018 were he overdosed on his psychotropic medications and [facility] Reports over 60 suicide attempts. He has tried and failed multiple antipsychotics as noted on previous admissions.  Is the patient at risk to self? Yes.    Has the patient been a risk to self in the past 6 months? Yes.    Has the patient been a risk to self within the distant past? Yes.    Is the patient a risk to others? Yes.    Has the patient been a risk to others in the past 6 months? Yes.    Has the patient been a risk to others within the distant past? Yes.     Prior Inpatient Therapy:   Prior Outpatient Therapy:    Alcohol Screening: 1. How often do you have a drink containing alcohol?: Never 2. How many drinks containing alcohol do you have on a typical day when you are drinking?: 1 or 2 3. How often do you have six or more drinks on one occasion?: Never AUDIT-C Score: 0 4. How often during the last year have you found that you were not able to stop drinking once you had started?: Never 5. How often during the last year have you failed to do what was normally expected from you becasue of drinking?: Never 6. How often during the last year have you needed a first drink in the  morning to get yourself going after a heavy drinking session?: Never 7. How often during the last year have you had a feeling of guilt of remorse after drinking?: Never 8. How often during the last year have you been unable to remember what happened the night before because you had been drinking?: Never 9. Have you or someone else been injured as a result of your drinking?: No 10. Has a relative or friend or a doctor or another health worker been concerned about your drinking or suggested you cut down?: No Alcohol Use Disorder Identification Test Final Score (AUDIT): 0 Substance Abuse History in the last 12 months:  Yes.   Consequences of Substance Abuse: Medical Consequences:  worsening psychosis Previous Psychotropic Medications: Yes  Psychological Evaluations: Yes  Past Medical History:  Past Medical History:  Diagnosis Date  . Asthma   . CHF (congestive heart failure) ([place]   . Chronic back pain   . COPD (chronic obstructive pulmonary disease) ([place]   . Knee pain, chronic   . Migraines   . Schizophrenia, schizo-affective ([place]     Past Surgical History:  Procedure Laterality Date  . KNEE SURGERY     four times  . WISDOM TOOTH EXTRACTION     Family  History:  Family History  Problem Relation Age of Onset  . Hypertension Father    Family Psychiatric  History: denies family psychiatric history Tobacco Screening:   Social History:  Social History   Substance and Sexual Activity  Alcohol Use No   Comment: occ     Social History   Substance and Sexual Activity  Drug Use No   Comment: Pt denied    Additional Social History:   Allergies:   Allergies  Allergen Reactions  . Amoxicillin Diarrhea    Has patient had a PCN reaction causing immediate rash, facial/tongue/throat swelling, SOB or lightheadedness with hypotension: No Has patient had a PCN reaction causing severe rash involving mucus membranes or skin necrosis: No Has patient had a PCN reaction that required  hospitalization: No Has patient had a PCN reaction occurring within the last 10 years: No If all of the above answers are "NO", then may proceed with Cephalosporin use.  Pt did not elaborate  . Dextromethorphan-Guaifenesin Other (See Comments)    unknown  . Haloperidol Other (See Comments)    unspecified  . Haloperidol Lactate   . Meloxicam Other (See Comments)    unspecified  . Nsaids Other (See Comments)    Rectal bleeding  . Quetiapine Other (See Comments)    Psychosis with high dose (646m)  . Sudafed [Pseudoephedrine Hcl] Other (See Comments)    Hallucinations  . Ziprasidone Other (See Comments)    unspecified  . Prednisone Other (See Comments) and Anxiety    Psychotic episodes  Patient states it makes me psychotic   Lab Results:  Results for orders placed or performed during the hospital encounter of 01/15/20 (from the past 48 hour(s))  Comprehensive metabolic panel     Status: Abnormal   Collection Time: 01/15/20 11:30 PM  Result Value Ref Range   Sodium 143 135 - 145 mmol/L   Potassium 3.6 3.5 - 5.1 mmol/L   Chloride 106 98 - 111 mmol/L   CO2 26 22 - 32 mmol/L   Glucose, Bld 112 (H) 70 - 99 mg/dL    Comment: Glucose reference range applies only to samples taken after fasting for at least 8 hours.   BUN 11 6 - 20 mg/dL   Creatinine, Ser 1.10 0.61 - 1.24 mg/dL   Calcium 9.7 8.9 - 10.3 mg/dL   Total Protein 6.8 6.5 - 8.1 g/dL   Albumin 4.0 3.5 - 5.0 g/dL   AST 22 15 - 41 U/L   ALT 28 0 - 44 U/L   Alkaline Phosphatase 59 38 - 126 U/L   Total Bilirubin 0.7 0.3 - 1.2 mg/dL   GFR calc non Af Amer >60 >60 mL/min   GFR calc Af Amer >60 >60 mL/min   Anion gap 11 5 - 15    Comment: Performed at [facility] [address], [place] [address] Ethanol     Status: None   Collection Time: 01/15/20 11:30 PM  Result Value Ref Range   Alcohol, Ethyl (B) <10 <10 mg/dL    Comment: (NOTE) Lowest detectable limit for serum alcohol is 10 mg/dL. For medical  purposes only. Performed at [facility] [address] [id number]  Salicylate level     Status: Abnormal   Collection Time: 01/15/20 11:30 PM  Result Value Ref Range   Salicylate Lvl <<1.6(L) 7.0 - 30.0 mg/dL    Comment: Performed at [address],  [place], [place] [address]  Acetaminophen level     Status: None   Collection Time: 01/15/20 11:30 PM  Result Value Ref Range   Acetaminophen (Tylenol), Serum 27 10 - 30 ug/mL    Comment: (NOTE) Therapeutic concentrations vary significantly. A range of 10-30 ug/mL  may be an effective concentration for many patients. However, some  are best treated at concentrations outside of this range. Acetaminophen concentrations >150 ug/mL at 4 hours after ingestion  and >50 ug/mL at 12 hours after ingestion are often associated with  toxic reactions. Performed at [facility], [place] [address], [place], [place] [address]   cbc     Status: None   Collection Time: 01/15/20 11:30 PM  Result Value Ref Range   WBC 9.5 4.0 - 10.5 K/uL   RBC 5.21 4.22 - 5.81 MIL/uL   Hemoglobin 15.2 13.0 - 17.0 g/dL   HCT 47.5 39.0 - 52.0 %   MCV 91.2 80.0 - 100.0 fL   MCH 29.2 26.0 - 34.0 pg   MCHC 32.0 30.0 - 36.0 g/dL   RDW 12.8 11.5 - 15.5 %   Platelets 274 150 - 400 K/uL   nRBC 0.0 0.0 - 0.2 %    Comment: Performed at [facility], [place] [address], [place], [place] [address]  Respiratory Panel by RT PCR (Flu A&B, Covid) - Nasopharyngeal Swab     Status: None   Collection Time: 01/16/20  2:45 AM   Specimen: Nasopharyngeal Swab  Result Value Ref Range   SARS Coronavirus 2 by RT PCR NEGATIVE NEGATIVE    Comment: (NOTE) SARS-CoV-2 target nucleic acids are NOT DETECTED. The SARS-CoV-2 RNA is generally detectable in upper respiratoy specimens during the acute phase of infection. The lowest concentration of SARS-CoV-2 viral copies this assay can detect is 131 copies/mL. A negative result does not preclude  SARS-Cov-2 infection and should not be used as the sole basis for treatment or other patient management decisions. A negative result may occur with  improper specimen collection/handling, submission of specimen other than nasopharyngeal swab, presence of viral mutation(s) within the areas targeted by this assay, and inadequate number of viral copies (<131 copies/mL). A negative result must be combined with clinical observations, patient history, and epidemiological information. The expected result is Negative. Fact Sheet for Patients:  PinkCheek.be Fact Sheet for Healthcare Providers:  GravelBags.it This test is not yet ap proved or cleared by the [place] FDA and  has been authorized for detection and/or diagnosis of SARS-CoV-2 by FDA under an Emergency Use Authorization (EUA). This EUA will remain  in effect (meaning this test can be used) for the duration of the COVID-19 declaration under Section 564(b)(1) of the Act, 21 U.S.C. section 360bbb-3(b)(1), unless the authorization is terminated or revoked sooner.    Influenza A by PCR NEGATIVE NEGATIVE   Influenza B by PCR NEGATIVE NEGATIVE    Comment: (NOTE) The Xpert Xpress SARS-CoV-2/FLU/RSV assay is intended as an aid in  the diagnosis of influenza from Nasopharyngeal swab specimens and  should not be used as a sole basis for treatment. Nasal washings and  aspirates are unacceptable for Xpert Xpress SARS-CoV-2/FLU/RSV  testing. Fact Sheet for Patients: PinkCheek.be Fact Sheet for Healthcare Providers: GravelBags.it This test is not yet approved or cleared by the [place] FDA and  has been authorized for detection and/or diagnosis of SARS-CoV-2 by  FDA under an Emergency Use Authorization (EUA). This EUA will remain  in effect (meaning this test can be  used) for the duration of the  Covid-19 declaration  under Section 564(b)(1) of the Act, 21  U.S.C. section 360bbb-3(b)(1), unless the authorization is  terminated or revoked. Performed at [facility], [place] [address], [place], [place] [address]   Rapid urine drug screen (hospital performed)     Status: Abnormal   Collection Time: 01/16/20  6:11 AM  Result Value Ref Range   Opiates NONE DETECTED NONE DETECTED   Cocaine NONE DETECTED NONE DETECTED   Benzodiazepines POSITIVE (A) NONE DETECTED   Amphetamines NONE DETECTED NONE DETECTED   Tetrahydrocannabinol POSITIVE (A) NONE DETECTED   Barbiturates NONE DETECTED NONE DETECTED    Comment: (NOTE) DRUG SCREEN FOR MEDICAL PURPOSES ONLY.  IF CONFIRMATION IS NEEDED FOR ANY PURPOSE, NOTIFY LAB WITHIN 5 DAYS. LOWEST DETECTABLE LIMITS FOR URINE DRUG SCREEN Drug Class                     Cutoff (ng/mL) Amphetamine and metabolites    1000 Barbiturate and metabolites    200 Benzodiazepine                 762 Tricyclics and metabolites     300 Opiates and metabolites        300 Cocaine and metabolites        300 THC                            50 Performed at [facility], [place] [address] [place], [place] [address]     Blood Alcohol level:  Lab Results  Component Value Date   [facility] <10 01/15/2020   ETH <10 54/56/2563    Metabolic Disorder Labs:  Lab Results  Component Value Date   HGBA1C 5.5 06/25/2017   MPG 111.15 06/25/2017   Lab Results  Component Value Date   PROLACTIN 42.9 (H) 08/19/2017   PROLACTIN 27.2 (H) 06/25/2017   Lab Results  Component Value Date   CHOL 198 06/25/2017   TRIG 183 (H) 06/25/2017   HDL 35 (L) 06/25/2017   CHOLHDL 5.7 06/25/2017   VLDL 37 06/25/2017   LDLCALC 126 (H) 06/25/2017    Current Medications: Current Facility-Administered Medications  Medication Dose Route Frequency Provider Last Rate Last Admin  . acetaminophen (TYLENOL) tablet 1,000 mg  1,000 mg Oral Q6H PRN Lindon Romp A, NP   1,000 mg at 01/17/20 0422  . albuterol  (VENTOLIN HFA) 108 (90 Base) MCG/ACT inhaler 2 puff  2 puff Inhalation Q4H PRN Mordecai Maes, NP      . ALPRAZolam [name]) tablet 1 mg  1 mg Oral TID PRN Mordecai Maes, NP   1 mg at 01/17/20 0450  . fluPHENAZine (PROLIXIN) tablet 10 mg  10 mg Oral QHS Mordecai Maes, NP   10 mg at 01/16/20 2004  . gabapentin (NEURONTIN) capsule 100 mg  100 mg Oral TID Rankin, Shuvon B, NP   100 mg at 01/17/20 0803  . OLANZapine (ZYPREXA) tablet 40 mg  40 mg Oral QHS Mordecai Maes, NP   40 mg at 01/16/20 2003  . perphenazine (TRILAFON) tablet 4 mg  4 mg Oral BID Mordecai Maes, NP   4 mg at 01/17/20 0803   PTA Medications: Medications Prior to Admission  Medication Sig Dispense Refill Last Dose  . acetaminophen (TYLENOL) 325 MG tablet Take 2 tablets (650 mg total) by mouth every 6 (six) hours as needed for mild pain, fever or headache.     Marland Kitchen  acetaminophen (TYLENOL) 500 MG tablet Take 1,000 mg by mouth every 6 (six) hours as needed for moderate pain.     Marland Kitchen albuterol (VENTOLIN HFA) 108 (90 Base) MCG/ACT inhaler Inhale 2 puffs into the lungs every 4 (four) hours as needed for wheezing or shortness of breath. 18 g 1   . ALPRAZolam (XANAX) 1 MG tablet Take 1 mg by mouth 3 (three) times daily.      Marland Kitchen azithromycin (ZITHROMAX) 250 MG tablet Take 1 tablet (250 mg total) by mouth daily. Take first 2 tablets together, then 1 every day until finished. (Patient not taking: Reported on 01/16/2020) 6 tablet 0 Completed Course at Unknown time  . diclofenac sodium (VOLTAREN) 1 % GEL Apply 4 g topically 4 (four) times daily as needed. 500 g 6   . fluPHENAZine decanoate (PROLIXIN) 25 MG/ML injection Inject into the muscle.     Marland Kitchen HYDROcodone-acetaminophen (NORCO) 10-325 MG tablet Take 1-2 tablets by mouth every 6 (six) hours as needed for moderate pain.     . mupirocin ointment (BACTROBAN) 2 % Place 1 application into the nose 2 (two) times daily. 22 g 3   . OLANZapine (ZYPREXA) 10 MG tablet Take 40 mg by mouth at bedtime.       Marland Kitchen perphenazine (TRILAFON) 4 MG tablet Take 1 tablet (4 mg total) by mouth 2 (two) times daily.     . potassium chloride SA (KLOR-CON) 20 MEQ tablet Take 1 tablet (20 mEq total) by mouth 2 (two) times daily. 20 tablet 0   . QUEtiapine (SEROQUEL) 25 MG tablet Take 75 mg by mouth daily.       Musculoskeletal: Strength & Muscle Tone: within normal limits Gait & Station: normal Patient leans: N/A  Psychiatric Specialty Exam: Physical Exam  Nursing note and vitals reviewed. Neck:  Chronic dystonia of the neck    Review of Systems  Constitutional: Negative for chills and fever.  Respiratory: Negative for cough.   Cardiovascular: Negative for chest pain.  Musculoskeletal: Positive for back pain and neck stiffness.  Psychiatric/Behavioral: Positive for hallucinations and substance abuse. Negative for depression and suicidal ideas. The patient is nervous/anxious.     Blood pressure 128/90, pulse 95, temperature 98.8 F (37.1 C), temperature source Oral, resp. rate 16, height 6' 1"  (1.854 m), weight 85.7 kg, SpO2 98 %.Body mass index is 24.94 kg/m.  General Appearance: Casual and Fairly Groomed  Eye Contact:  Good  Speech:  Clear and Coherent and Normal Rate  Volume:  Normal  Mood:  Anxious and Irritable  Affect:  Blunt, Congruent and Labile  Thought Process:  Coherent, Disorganized, Irrelevant and Descriptions of Associations: Loose  Orientation:  Full (Time, Place, and Person)  Thought Content:  Delusions, Hallucinations: Auditory, Ideas of Reference:   Paranoia, Paranoid Ideation, Rumination and Tangential  Suicidal Thoughts:  Yes.  without intent/plan  Homicidal Thoughts:  No  Memory:  Immediate;   Fair Recent;   Fair Remote;   Fair  Judgement:  Fair  Insight:  Lacking  Psychomotor Activity:  Normal  Concentration:  Concentration: Good  Recall:  [employer] of Knowledge:  Fair  Language:  Fair  Akathisia:  No  Handed:    AIMS (if indicated):     Assets:  Communication  Skills Resilience Social Support Talents/Skills  ADL's:  Intact  Cognition:  WNL  Sleep:  Number of Hours: 5.75    Treatment Plan Summary: Daily contact with patient to assess and evaluate symptoms and progress in treatment and  Medication management  Observation Level/Precautions:  15 minute checks  Laboratory:  CBC Chemistry Profile HbAIC UDS  Psychotherapy:  Encourage participation in groups and therapeutic milieu  Medications:  Restart Zyprexa 60m po qhs and Seroquel 547mpo BID. Xanax 48m54mo BID.   Consultations:    Discharge Concerns:    Estimated LOS: 5-7 days  Other:     Physician Treatment Plan for Primary Diagnosis: Schizophrenia, paranoid type (HCCKendletonong Term Goal(s): Improvement in symptoms so as ready for discharge  Short Term Goals: Ability to identify changes in lifestyle to reduce recurrence of condition will improve and Ability to identify and develop effective coping behaviors will improve  Physician Treatment Plan for Secondary Diagnosis: Principal Problem:   Schizophrenia, paranoid type (HCCNiobraraLong Term Goal(s): Improvement in symptoms so as ready for discharge  Short Term Goals: Ability to demonstrate self-control will improve and Ability to identify triggers associated with substance abuse/mental health issues will improve  I certify that inpatient services furnished can reasonably be expected to improve the patient's condition.    TakSuella BroadNP 5/11/20219:54 AM  Review of Systems  Constitutional: Negative for chills and fever.  Respiratory: Negative for cough.   Cardiovascular: Negative for chest pain.  Musculoskeletal: Positive for back pain and neck stiffness.  Psychiatric/Behavioral: Positive for hallucinations and substance abuse. Negative for depression and suicidal ideas. The patient is nervous/anxious.    I have discussed case with NP and have met with patient  Agree with NP note and assessment  [age]ar old male, has an 18 60ear old son, lives alone, [place] under [place]C . As per IVC ""has been diagnosed with schizophrenia. He flushed his meds down the toilet. Today, the respondent stated his was contemplating suicide. Respondent is very aggressive. He threatened his family and neighbors because the voices in his head told him they were out to get him. He also threatened to bomb the ACT team. He claims to hear voices and constantly he talks/yells back at them."  Patient presents as a fair historian and provides limited information at this time.  Thought process is disorganized and tangential . He acknowledges he has not been taking his psychiatric medications, and  has been experiencing auditory hallucinations. He presents irritable and states " I am tired of being imprisoned ". Tends to focus on chronic pain, and describes chronic back and neck pain.   He reports he has been diagnosed with Schizoaffective Disorder in the past . History of prior psychiatric admissions , most recently about a year and a half ago. At the time he was diagnosed with Schizoaffective Disorder and discharged on Invega,  Reports history of multiple suicide attempts , most recently 11/2018 He is followed by an ACT team . [facility], . Denies alcohol or drug abuse . Admission UDS  (+) for BZDs and Cannabis, negative for opiates. BAL negative. Medical History- reports chronic lower back and neck pain.  Home medications-Xanax 1 mgr TID, Hydrocodone 10 mgrs Q 6 hours , Zyprexa 40 mgrs QHS, Trilafon 4 mgrs BID, Seroquel 75 mgrs QDAY. As noted, report is that he has been non compliant with medications .  Dx- Schizoaffective Disorder  Plan- inpatient admission .  Upon review, on patient's most recent psychiatric admission in 11/2018 he was stabilized on Seroquel/Zyprexa Trilafon was discontinued at the time as felt to be less effective . Will resume Zyprexa at  20 mgrs QDAY initially/  Seroquel 25 mgrs BID. He has tolerated this combination well in the past.  Prior to admission was prescribed Xanax 1 mgr TID. Will initiate taper. At this time decrease dose to 1 mgr BID. ( and 0.5 mgrs PRN as needed for anxiety)  *Currently he is not presenting with symptoms of WDL, vitals are stable. Agitation Protocol for acute agitation as needed . Of note, patient denies being allergic to either Seroquel or Geodon, and states he has been on these before . States that he says " I am allergic to things when I don't want to take them , but I have no allergies to psychiatric medications".  Routine labs , including EKG to monitor QTc

## 2020-01-17 NOTE — Progress Notes (Signed)
Recreation Therapy Notes  INPATIENT RECREATION THERAPY ASSESSMENT  Patient Details Name: [name] MRN: [id number] DOB: 08-28-1971 Today's Date: 01/17/2020       Information Obtained From: Patient  Able to Participate in Assessment/Interview: Yes  Patient Presentation: Alert  Reason for Admission (Per Patient): Other (Comments)(Pt stated he was IVC'd)  Patient Stressors: Family, Death, School  Coping Skills:   Isolation, Journal, Arguments, Aggression, Music, Meditate, Deep Breathing, Prayer, Avoidance  Leisure Interests (2+):  Individual - Other (Comment)(Watch music videos; Watch studies on DNA and genetics)  Frequency of Recreation/Participation: Other (Comment)(Daily)  Awareness of Community Resources:  No  Expressed Interest in [employer] Information: No  County of Residence:  [place]  Patient Main Form of Transportation: Diplomatic Services operational officer  Patient Strengths:  God; Physicist, medical; My people; Knowledge; Wisdom; My charity; Hope; and Faithfulness  Patient Identified Areas of Improvement:  Personality; Anger Management Techniques  Patient Goal for Hospitalization:  "to achieve international peace, be a mixed Wellsite geologist, boxer or welterweight UFC champion"  Current SI (including self-harm):  No  Current HI:  No  Current AVH: Yes("hearing a man's voice- not saying anything specific")  Staff Intervention Plan: Group Attendance, Collaborate with Interdisciplinary Treatment Team  Consent to Intern Participation: N/A    Victorino Sparrow, LRT/CTRS  Victorino Sparrow A 01/17/2020, 11:31 AM

## 2020-01-17 NOTE — Progress Notes (Signed)
Pt continues to come out of his room and walk the hall talking loudly. "My medicine is Seroquel 25 mg." Pt informed that Seroquel is on his list of allergies. "I lied!" Pt informed to tell provider in morning that he isn't allergic to this med.

## 2020-01-17 NOTE — BHH Counselor (Signed)
Adult Comprehensive Assessment  Patient ID: [name], male   DOB: February 20, 1971, 49 y.o.   MRN: [id number]  Information Source: Information source: Patient(Chart review)  Current Stressors:  Patient states their primary concerns and needs for treatment are:: "I'm withdrawing from pain medication, I did not tell the doctor that. 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 "I tried calling them today and they did not answer. They must all be dead or replaced. 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 Social: "I have no had sex with a woman in years. I have not been hugged by someone in 7 years. I masturbate about 20 times." Lonely.  Living/Environment/Situation:  Living Arrangements: Alone Living conditions (as described by patient or guardian): Pt lives alone in [place] in an apartment.  How long has patient lived in current situation?:14years What is atmosphere in current home: It's hell."  Family History:  Marital status: Widowed Widowed, when?: 2005 Does patient have children?: Yes How many children?: 1 How is patient's relationship with their children?: 22 year old son. Patient's mother had custody of son and now patient has a strained relationship with his child.  Childhood History:  By whom was/is the patient raised?: Mother/father and step-parent;Mother Additional childhood history information: Pt reports being raised by mother and step father and reports step father was  a "pot head". Pt describes his childhood as good.  Description of patient's relationship with caregiver when they were a child: Pt reports getting along well with parents growing up.  Patient's description of current relationship with people who raised him/her: Pt reports having a"so-so"relationship with mother-"we arguea lot." Father is out of prison-relationship is weird-"I hear his voicea lot" Does patient have siblings?: Yes Number of Siblings: 2 Description of patient's current relationship with siblings: reports having a strained relationship with brother, decent relationship with sister Did patient suffer any verbal/emotional/physical/sexual abuse as a child?: No Did patient suffer from severe childhood neglect?: No Has patient ever been sexually abused/assaulted/raped as an adolescent or adult?: No Was the patient ever a victim of a crime or a disaster?: No Witnessed domestic violence?: No Has patient been effected by domestic violence as an adult?: No  Education:  Highest grade of school patient has completed: some college Currently a Ship broker?: No Learning disability?: No  Employment/Work Situation:  Employment situation: On disability Why is patient on disability: mental health How long has patient been on disability: since 14 Patient's job has been impacted by current illness: No What is the longest time patient has a held a job?: 10 years 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 I like women." Has patient ever served in combat?: No  Financial Resources:  Museum/gallery curator resources: Insurance claims handler;Food stamps Does patient have a representative payee or guardian?: No  Alcohol/Substance Abuse:  What has been your use of drugs/alcohol within the last 12 months?: Endorses taking pain medication, denies other substance use If attempted  suicide, did drugs/alcohol  play a role in this?: No Alcohol/Substance Abuse Treatment Hx: Denies past history If yes, describe treatment: N/A Has alcohol/substance abuse ever caused legal problems?: No  Social Support System: Heritage manager System:Poor Describe Community Support System:Reports no support Type of faith/religion: Christian How does patient's faith help to cope with current illness?: prayer, occasional church attendance  Leisure/Recreation:  Leisure and Hobbies: play video games, work on Land:  What things does the patient do well?: make people laugh, rebuild computers  Discharge Plan:  Does patient have access to transportation?: Yes Will patient be returning to same living situation after discharge?: Yes Currently receiving community mental health services: Is established with [facility] but no longer wishes to receive their services.  If no, would patient like referral for services when discharged?: Yes, unsure of what follow up he wants at this time. Knows he does not want [facility] or [facility] outpatient. CSW will follow up closer to discharge. Does patient have financial barriers related to discharge medications?: No, has Medicare.   Summary/Recommendations:   Summary and Recommendations (to be completed by the evaluator): [name] is a 49 year old male who presents under IVC initiated by his son. Per IVC, respondent "has been diagnosed with schizophrenia. He flushed his meds down the toilet." Patient endorses that he stopped taking his medications about 3 weeks and states he has been abusing pain medications. Patient reports he has been established with [facility], but no longer wishes to receive their services. While here, [name] can benefit from crisis stabilization, medication management, therapeutic milieu, and referrals for services.  Joellen Jersey. 01/17/2020

## 2020-01-17 NOTE — Progress Notes (Addendum)
   01/17/20 2230  Psych Admission Type (Psych Patients Only)  Admission Status Involuntary  Psychosocial Assessment  Patient Complaints Anxiety;Agitation;Irritability;Restlessness  Eye Contact Brief  Facial Expression Anxious  Affect Anxious;Irritable  Speech Loud;Abusive  Interaction Assertive;Demanding  Motor Activity Slow;Shuffling  Appearance/Hygiene Unremarkable  Behavior Characteristics Agitated;Anxious;Irritable  Thought Process  Coherency Disorganized;Tangential;Loose associations  Content Blaming others  Delusions Paranoid  Perception Hallucinations  Hallucination Auditory  Judgment Impaired  Confusion Mild  Danger to Self  Current suicidal ideation? Denies  Danger to Others  Danger to Others None reported or observed   Pt seen at nurse's station. "I want my anxiety medicine." This writer was getting report from day shift and informed pt that he would have to wait. Pt walked away towards dayroom cursing and calling staff "Nazis." In the dayroom pt was saying "Heil Hitler!" and mimicking the salute. Pt difficult to redirect. Pt rates his anxiety 10/10 and endorses AH stating, "I hear a girl's voice from behind the chair." Denies that the voices are commanding.

## 2020-01-17 NOTE — Progress Notes (Addendum)
Pt banging on the wall, cursing and screaming. When RNs go to his room, he says he hurt his hand hitting the wall. Pt informed to let provider know in the morning. Pt continues to be hard to redirect.

## 2020-01-17 NOTE — BHH Suicide Risk Assessment (Signed)
[facility] INPATIENT:  Family/Significant Other Suicide Prevention Education  Suicide Prevention Education:  Patient Refusal for Family/Significant Other Suicide Prevention Education: The patient [name] has refused to provide written consent for family/significant other to be provided Family/Significant Other Suicide Prevention Education during admission and/or prior to discharge.  Physician notified.  Joellen Jersey 01/17/2020, 10:11 AM

## 2020-01-17 NOTE — Progress Notes (Addendum)
Pt continues to yell, "Kill me quick! They're killing me! I'm in pain! I don't belong here!" When RNs go to pts room to ask him to quiet down, pt says "You're gonna wake up dead! You are keeping me here against my will!"  Pt given Xanax 1 mg for his agitation and yelling. Pt back in room. Continues to yell. Hard to redirect.

## 2020-01-18 LAB — HEMOGLOBIN A1C
Hgb A1c MFr Bld: 5.6 % (ref 4.8–5.6)
Mean Plasma Glucose: 114.02 mg/dL

## 2020-01-18 LAB — PROLACTIN: Prolactin: 16.1 ng/mL — ABNORMAL HIGH (ref 4.0–15.2)

## 2020-01-18 MED ORDER — LORAZEPAM 2 MG/ML IJ SOLN
INTRAMUSCULAR | Status: AC
  Administered 2020-01-18: 2 mg via INTRAMUSCULAR
  Filled 2020-01-18: qty 1

## 2020-01-18 MED ORDER — ZIPRASIDONE MESYLATE 20 MG IM SOLR
INTRAMUSCULAR | Status: AC
  Administered 2020-01-18: 20 mg via INTRAMUSCULAR
  Filled 2020-01-18: qty 20

## 2020-01-18 MED ORDER — RISPERIDONE 2 MG PO TABS
2.0000 mg | ORAL_TABLET | Freq: Two times a day (BID) | ORAL | Status: DC
  Administered 2020-01-18 – 2020-01-22 (×9): 2 mg via ORAL
  Filled 2020-01-18 (×14): qty 1

## 2020-01-18 MED ORDER — LORAZEPAM 2 MG/ML IJ SOLN: 2.0000 mg | Freq: Four times a day (QID) | INTRAMUSCULAR | Status: DC | PRN

## 2020-01-18 MED ORDER — ZIPRASIDONE MESYLATE 20 MG IM SOLR
20.0000 mg | Freq: Two times a day (BID) | INTRAMUSCULAR | Status: DC | PRN
  Administered 2020-01-20 – 2020-01-28 (×6): 20 mg via INTRAMUSCULAR
  Filled 2020-01-18 (×7): qty 20

## 2020-01-18 MED ORDER — DIPHENHYDRAMINE HCL 50 MG/ML IJ SOLN: 50.0000 mg | Freq: Four times a day (QID) | INTRAMUSCULAR | Status: DC | PRN

## 2020-01-18 MED ORDER — LORAZEPAM 1 MG PO TABS
2.0000 mg | ORAL_TABLET | Freq: Four times a day (QID) | ORAL | Status: DC | PRN
  Administered 2020-01-19 – 2020-02-01 (×26): 2 mg via ORAL
  Filled 2020-01-18 (×27): qty 2

## 2020-01-18 MED ORDER — ALPRAZOLAM 0.5 MG PO TABS
1.0000 mg | ORAL_TABLET | Freq: Three times a day (TID) | ORAL | Status: DC
  Administered 2020-01-18 – 2020-01-22 (×12): 1 mg via ORAL
  Filled 2020-01-18 (×12): qty 2

## 2020-01-18 MED ORDER — DIPHENHYDRAMINE HCL 25 MG PO CAPS
50.0000 mg | ORAL_CAPSULE | Freq: Four times a day (QID) | ORAL | Status: DC | PRN
  Administered 2020-01-20 – 2020-02-01 (×12): 50 mg via ORAL
  Filled 2020-01-18 (×15): qty 2

## 2020-01-18 MED ORDER — HYDROCODONE-ACETAMINOPHEN 10-325 MG PO TABS
1.0000 | ORAL_TABLET | Freq: Three times a day (TID) | ORAL | Status: DC
  Administered 2020-01-18: 1 via ORAL

## 2020-01-18 MED ORDER — HYDROCODONE-ACETAMINOPHEN 5-325 MG PO TABS
1.0000 | ORAL_TABLET | Freq: Three times a day (TID) | ORAL | Status: DC | PRN
  Administered 2020-01-18 – 2020-01-24 (×15): 1 via ORAL
  Filled 2020-01-18 (×15): qty 1

## 2020-01-18 NOTE — Progress Notes (Signed)
Heard a loud noise coming from the dayroom. This Probation officer and MHT went to see what was the problem. Pt had kicked a hole in the wall in the dayroom. As he was walking out of the dayroom, pt said, "That's what happens when I don't get the medicine that I need. Weak ass shit." Pt told that he was not allowed to go back into the dayroom. Pt still pacing the halls and talking to himself.

## 2020-01-18 NOTE — BHH Group Notes (Signed)
LCSW Group Note  01/18/2020   Participation Level:  Construction in the dayroom prevented CSW from facilitating group. There is not an alterative group space on the unit.    Joellen Jersey, [place]  01/18/2020 2:57 PM

## 2020-01-18 NOTE — Progress Notes (Signed)
Patient received geodon IM early this morning.  Patient slept and was appropriate this morning.  After lunch, patient became upset again, hitting walls, yelling, etc.  Patient was given prn medications per MD order.  Patient cursed and threatened to hurt staff prior to receiving this medication.  Patient returned to his room.  Patient has been calm taking his since prn medications.

## 2020-01-18 NOTE — Progress Notes (Signed)
Progress note  Pt has been ramping up since this AM, using racial slurs and inappropriate language towards staff and other patients. Pt continued to pace and hit walls. Pt also has verbally harassed others, threatening them. Pt was provided PRN medication with little impact to agitation. Other patients on the unit left for rec and this pt became agitated because they were on unit restriction. Pt continued to ramp up, seeming to be responding to internal stimuli. Pt was asked to quiet their voice as others were resting and the pt began screaming and cursing staff. This writer went into the medication room and this pt started hitting the door trying to enter. Medication was ordered by the MD and the pt was compliant taking the medication, though stating that staff were poisoning them while being administered. Pt continued to pace and use profanity/racial slurs. Pt seeming to calm down now. Pt resting now. Pt safe on the unit. q46m safety checks implemented and continued. Will continue to monitor.

## 2020-01-18 NOTE — Progress Notes (Signed)
[facility] MD Progress Note  01/18/2020 10:06 AM [name]  MRN:  [id number]   Subjective: Follow-up for this 49 year old male diagnosed with paranoid type schizophrenia.  Patient reports today that he started taking the medications that he was taking at home.  He continues to ramble about the medication causing his psychosis and that was why he flushed it down the toilet.  Patient also reporting that people are out to get him and that they are following him.  Patient continues to report that he is functioning imprisoned and that he is going to sue the hospital.  He also makes statements about the nurses over running the hospital and giving medications that should not.  Patient reports that he is not going to take Zyprexa anymore but does agree to take Risperdal and is focused on getting his Xanax as well as his pain medication which she reports as being oxycodone's.  Principal Problem: Schizophrenia, paranoid type ([place]) Diagnosis: Principal Problem:   Schizophrenia, paranoid type ([place])  Total Time spent with patient: 30 minutes  Past Psychiatric History: Schziophrenia with history of multiple inpatient admissions. Most recent attmept was 11/2018 were he overdosed on his psychotropic medications and spent 10 days at [facility]. Reports over 60 suicide attempts. He has tried and failed multiple antipsychotics as noted on previous admissions.  Past Medical History:  Past Medical History:  Diagnosis Date  . Asthma   . CHF (congestive heart failure) ([place])   . Chronic back pain   . COPD (chronic obstructive pulmonary disease) ([place])   . Knee pain, chronic   . Migraines   . Schizophrenia, schizo-affective ([place])     Past Surgical History:  Procedure Laterality Date  . KNEE SURGERY     four times  . WISDOM TOOTH EXTRACTION     Family History:  Family History  Problem Relation Age of Onset  . Hypertension Father    Family Psychiatric  History: Denies Social History:  Social History    Substance and Sexual Activity  Alcohol Use No   Comment: occ     Social History   Substance and Sexual Activity  Drug Use No   Comment: Pt denied    Social History   Socioeconomic History  . Marital status: Widowed    Spouse name: Not on file  . Number of children: Not on file  . Years of education: Not on file  . Highest education level: Not on file  Occupational History  . Occupation: on disability  Tobacco Use  . Smoking status: Current Every Day Smoker    Packs/day: 1.00    Years: 33.00    Pack years: 33.00    Types: Cigarettes  . Smokeless tobacco: Never Used  Substance and Sexual Activity  . Alcohol use: No    Comment: occ  . Drug use: No    Comment: Pt denied  . Sexual activity: Not Currently  Other Topics Concern  . Not on file  Social History Narrative   Pt is a widower.  He lives alone, and he receives SSI Disability.  Pt was followed by Dr. Darleene Cleaver, but he has not seen Dr. Darleene Cleaver in several months.   Social Determinants of Health   Financial Resource Strain:   . Difficulty of Paying Living Expenses:   Food Insecurity:   . Worried About Charity fundraiser in the Last Year:   . Arboriculturist in the Last Year:   Transportation Needs:   . Lack of Transportation (  Medical):   Marland Kitchen Lack of Transportation (Non-Medical):   Physical Activity:   . Days of Exercise per Week:   . Minutes of Exercise per Session:   Stress:   . Feeling of Stress :   Social Connections:   . Frequency of Communication with Friends and Family:   . Frequency of Social Gatherings with Friends and Family:   . Attends Religious Services:   . Active Member of Clubs or Organizations:   . Attends Archivist Meetings:   Marland Kitchen Marital Status:    Additional Social History:                         Sleep: Poor  Appetite:  Fair  Current Medications: Current Facility-Administered Medications  Medication Dose Route Frequency Provider Last Rate Last Admin  .  acetaminophen (TYLENOL) tablet 1,000 mg  1,000 mg Oral Q6H PRN Lindon Romp A, NP   1,000 mg at 01/18/20 0307  . albuterol (VENTOLIN HFA) 108 (90 Base) MCG/ACT inhaler 2 puff  2 puff Inhalation Q4H PRN Mordecai Maes, NP      . ALPRAZolam [name]) tablet 0.5 mg  0.5 mg Oral BID PRN Cobos, Myer Peer, MD   0.5 mg at 01/17/20 2027  . ALPRAZolam [name]) tablet 1 mg  1 mg Oral BID Cobos, Myer Peer, MD   1 mg at 01/18/20 0906  . lidocaine (LIDODERM) 5 % 1 patch  1 patch Transdermal Q24H Cobos, Myer Peer, MD   1 patch at 01/17/20 1221  . OLANZapine (ZYPREXA) tablet 20 mg  20 mg Oral QHS Cobos, Myer Peer, MD   20 mg at 01/17/20 2026  . OLANZapine zydis (ZYPREXA) disintegrating tablet 10 mg  10 mg Oral Q8H PRN Cobos, Myer Peer, MD   10 mg at 01/18/20 0307  . QUEtiapine (SEROQUEL) tablet 25 mg  25 mg Oral BID Cobos, Myer Peer, MD   25 mg at 01/18/20 [id number]    Lab Results:  Results for orders placed or performed during the hospital encounter of 01/16/20 (from the past 48 hour(s))  Hemoglobin A1c     Status: None   Collection Time: 01/17/20  6:29 PM  Result Value Ref Range   Hgb A1c MFr Bld 5.6 4.8 - 5.6 %    Comment: (NOTE) Pre diabetes:          5.7%-6.4% Diabetes:              >6.4% Glycemic control for   <7.0% adults with diabetes    Mean Plasma Glucose 114.02 mg/dL    Comment: Performed at [facility] [address], [place], [place] [address]  Lipid panel     Status: Abnormal   Collection Time: 01/17/20  6:29 PM  Result Value Ref Range   Cholesterol 154 0 - 200 mg/dL   Triglycerides 186 (H) <150 mg/dL   HDL 33 (L) >40 mg/dL   Total CHOL/HDL Ratio 4.7 RATIO   VLDL 37 0 - 40 mg/dL   LDL Cholesterol 84 0 - 99 mg/dL    Comment:        Total Cholesterol/HDL:CHD Risk Coronary Heart Disease Risk Table                     Men   Women  1/2 Average Risk   3.4   3.3  Average Risk       5.0   4.4  2 X Average Risk   9.6  7.1  3 X Average Risk  23.4   11.0        Use the  calculated Patient Ratio above and the CHD Risk Table to determine the patient's CHD Risk.        ATP III CLASSIFICATION (LDL):  <100     mg/dL   Optimal  100-129  mg/dL   Near or Above                    Optimal  130-159  mg/dL   Borderline  160-189  mg/dL   High  >190     mg/dL   Very High Performed at [facility] [address] [place], [place] [address]   Prolactin     Status: Abnormal   Collection Time: 01/17/20  6:29 PM  Result Value Ref Range   Prolactin 16.1 (H) 4.0 - 15.2 ng/mL    Comment: (NOTE) Performed At: [facility] [place], [place] [id number] Rush Farmer MD [id number]   TSH     Status: None   Collection Time: 01/17/20  6:29 PM  Result Value Ref Range   TSH 1.301 0.350 - 4.500 uIU/mL    Comment: Performed by a 3rd Generation assay with a functional sensitivity of <=0.01 uIU/mL. Performed at [facility], [place] [address] [place], [place] [address]     Blood Alcohol level:  Lab Results  Component Value Date   [facility] <10 01/15/2020   ETH <10 [id number]    Metabolic Disorder Labs: Lab Results  Component Value Date   HGBA1C 5.6 01/17/2020   MPG 114.02 01/17/2020   MPG 111.15 06/25/2017   Lab Results  Component Value Date   PROLACTIN 16.1 (H) 01/17/2020   PROLACTIN 42.9 (H) 08/19/2017   Lab Results  Component Value Date   CHOL 154 01/17/2020   TRIG 186 (H) 01/17/2020   HDL 33 (L) 01/17/2020   CHOLHDL 4.7 01/17/2020   VLDL 37 01/17/2020   LDLCALC 84 01/17/2020   LDLCALC 126 (H) 06/25/2017    Physical Findings: AIMS:  , ,  ,  ,    CIWA:    COWS:     Musculoskeletal: Strength & Muscle Tone: within normal limits Gait & Station: normal Patient leans: N/A  Psychiatric Specialty Exam: Physical Exam  Nursing note and vitals reviewed. Constitutional: He is oriented to person, place, and time. He appears well-developed and well-nourished.  Cardiovascular: Normal rate.   Respiratory: Effort normal.  Musculoskeletal:        General: Normal range of motion.  Neurological: He is alert and oriented to person, place, and time.  Skin: Skin is warm.  Psychiatric: His mood appears anxious. His speech is rapid and/or pressured. He is agitated. Thought content is paranoid and delusional. He expresses impulsivity and inappropriate judgment.    Review of Systems  Constitutional: Negative.   HENT: Negative.   Eyes: Negative.   Respiratory: Negative.   Cardiovascular: Negative.   Gastrointestinal: Negative.   Genitourinary: Negative.   Skin: Negative.   Neurological: Negative.     Blood pressure (!) 143/97, pulse 68, temperature 98 F (36.7 C), temperature source Oral, resp. rate 18, height 6\' 1"  (1.854 m), weight 85.7 kg, SpO2 98 %.Body mass index is 24.94 kg/m.  General Appearance: Disheveled  Eye Contact:  Minimal  Speech:  Pressured  Volume:  Increased  Mood:  Anxious and Irritable  Affect:  Constricted  Thought Process:  Disorganized, Goal Directed and Descriptions of  Associations: Tangential  Orientation:  Full (Time, Place, and Person)  Thought Content:  Illogical, Delusions, Paranoid Ideation, Rumination and Tangential  Suicidal Thoughts:  No  Homicidal Thoughts:  No  Memory:  Immediate;   Fair Recent;   Fair Remote;   Fair  Judgement:  Impaired  Insight:  Lacking  Psychomotor Activity:  Normal  Concentration:  Concentration: Poor  Recall:  [place] of Knowledge:  Poor  Language:  Fair  Akathisia:  No  Handed:  Right  AIMS (if indicated):     Assets:  Financial Resources/Insurance Housing Resilience Social Support  ADL's:  Impaired  Cognition:  WNL  Sleep:  Number of Hours: 1.25   Assessment: Patient presents in the milieu is been seen interacting with peers and staff appropriately.  However it was reported that the patient became very aggressive this morning and was kicking holes in the walls.  Patient did receive the IM injection  this morning.  Patient continues to be paranoid, disorganized, irritable, and has delusions.  He does admit to flushing his medications down the toilet but does agree to take Risperdal and states that that did help him in the past.  Restarting risperidone the patient may be able to be started on a LAI to assist with continued stabilization after discharge from the hospital.  He reported that the patient only received 1.25 hours of sleep last night.  Patient did take his medications as prescribed.  PMP D does show that patient takes Xanax 1 mg p.o. 3 times daily as well as hydrocodone-acetaminophen 10-325 mg p.o. 3 times daily with last filled on 12/17/2019.  Consulted with Dr. Parke Poisson and will start patient on Risperdal 2 mg p.o. twice daily and discontinue Zyprexa.  We will also put patient back on Xanax 1 mg p.o. 3 times daily as well as his hydrocodone-acetaminophen 10-3 25 3  times daily with plan to taper down as patient stabilizes.  Current moment appears that patient is very irritable and unstable with potential band due to not having his pain medications since arriving at the hospital as well as no psychiatric medications for the last few days per patient report.  Reviewed labs patient is positive for benzodiazepines and THC.  TSH is normal.  Prolactin is slightly elevated at 16.1 and lipid panel is mostly normal with HDL at 33 and triglycerides at 186.  Treatment Plan Summary: Daily contact with patient to assess and evaluate symptoms and progress in treatment and Medication management Increase Xanax 1 mg p.o. 3 times daily Discontinue as needed Xanax Start hydrocodone-acetaminophen 10-325 mg p.o. 3 times daily for chronic pain Discontinue Zyprexa Start Risperdal 2 mg p.o. twice daily for schizophrenia Continue agitation protocol of Zyprexa Zydis, Ativan, and Geodon Continue Seroquel 25 mg p.o. twice daily for mood stability Encourage group therapy participation Continue every 15 minute safety  checks  Lowry Ram Shirlette Scarber, FNP 01/18/2020, 10:06 AM

## 2020-01-18 NOTE — Progress Notes (Signed)
Pt seen in hall cursing and threatened this Probation officer when I didn't give him his Alprazolam because it was not time for his dose. Pt also kicked holes in the wall in his room. This Probation officer drew up Geodon 20 mg. Pt stated that "You are practicing medicine without a license. You've been giving me medicine that my doctor didn't order! Who wants to be the first one to get hit?" This writer pushed her duress button and additional staff came.  Pt given option to take injection willingly or to be placed in the chair for his safety and that of the staff. Pt continued to curse and yell and threaten staff. Pt finally agreed to have the injection administered. Injection given in left deltoid.

## 2020-01-18 NOTE — Progress Notes (Addendum)
Pt back at nurse's station. Says he can't sleep because he is in pain and has a "cracked spine." Pt states that he has what he needs for the pain at home but is not getting it here. Patient back in room, yelling and screaming, cursing  and talking to himself.

## 2020-01-18 NOTE — Progress Notes (Signed)
Recreation Therapy Notes  Date: 5.12.21 Time: 1000 Location: 500 Hall  Group Topic: Communication, Team Building, Problem Solving  Goal Area(s) Addresses:  Patient will effectively work with peer towards shared goal.  Patient will identify skill used to make activity successful.  Patient will identify how skills used during activity can be used to reach post d/c goals.   Intervention: Hydrologist  Activity: Sharks in [employer].  Each person was given a rubber disc plus one extra disc for the group.  As a group, patients were to use the rubber discs to [facility] the group from one end of the hall to the other and back.  If anyone stepped off the disc, the group start over.  Education: Education officer, community, Dentist.   Education Outcome: Acknowledges education/In group clarification offered/Needs additional education.   Clinical Observations/Feedback: Pt did not attend group.     Victorino Sparrow, LRT/CTRS     Victorino Sparrow A 01/18/2020 11:26 AM

## 2020-01-18 NOTE — Progress Notes (Signed)
Pt seen at nurse's station. Says he his head feels hot on the left side of his forehead and wants his temperature checked. Pt temperature assessed and is 98.7 degrees F.

## 2020-01-18 NOTE — Progress Notes (Signed)
Pt seen at nurse's station. Pt c/o back pain and wanting hydrocodone "the order that the doctor wrote for me. I know he wrote it cause he said he would." Pt once again told that there is no order for hydrocodone. Pt told that he can take Tylenol. Pt has been in and out of his room several times tonight. Pt given Tylenol for back pain and Zyprexa 10 mg for his agitation. Will continue to monitor.

## 2020-01-18 NOTE — Progress Notes (Signed)
   01/18/20 2030  Psych Admission Type (Psych Patients Only)  Admission Status Involuntary  Psychosocial Assessment  Patient Complaints Agitation;Anxiety  Eye Contact Brief  Facial Expression Anxious  Affect Sad;Irritable;Anxious  Speech Slow;Pressured  Interaction Assertive;Demanding  Motor Activity Slow;Shuffling  Appearance/Hygiene Unremarkable  Behavior Characteristics Anxious  Mood Anxious;Labile  Thought Process  Coherency Disorganized;Tangential;Loose associations  Content Blaming others  Delusions Paranoid  Perception WDL  Hallucination None reported or observed  Judgment Impaired  Confusion Mild  Danger to Self  Current suicidal ideation? Denies  Danger to Others  Danger to Others None reported or observed   Pt seen in hall. Dayroom closed d/t pt kicking holes in wall and they were being repaired. Pt pacing but easier to redirect this evening. Pt c/o pain 7/10. Given Norco for pain and xanax. Pt refused Risperdal stating, "that's what they gave me earlier that made me crazy. I don't want that."

## 2020-01-18 NOTE — Tx Team (Signed)
Interdisciplinary Treatment and Diagnostic Plan Update  01/18/2020 Time of Session: 9:00am [name] Renovato MRN: [id number]  Principal Diagnosis: Schizophrenia, paranoid type [facility])  Secondary Diagnoses: Principal Problem:   Schizophrenia, paranoid type ([place])   Current Medications:  Current Facility-Administered Medications  Medication Dose Route Frequency Provider Last Rate Last Admin  . acetaminophen (TYLENOL) tablet 1,000 mg  1,000 mg Oral Q6H PRN Lindon Romp A, NP   1,000 mg at 01/18/20 0307  . albuterol (VENTOLIN HFA) 108 (90 Base) MCG/ACT inhaler 2 puff  2 puff Inhalation Q4H PRN Mordecai Maes, NP      . ALPRAZolam [name]) tablet 0.5 mg  0.5 mg Oral BID PRN Cobos, Myer Peer, MD   0.5 mg at 01/17/20 2027  . ALPRAZolam [name]) tablet 1 mg  1 mg Oral BID Cobos, Myer Peer, MD   1 mg at 01/18/20 0906  . lidocaine (LIDODERM) 5 % 1 patch  1 patch Transdermal Q24H Cobos, Myer Peer, MD   1 patch at 01/17/20 1221  . OLANZapine (ZYPREXA) tablet 20 mg  20 mg Oral QHS Cobos, Myer Peer, MD   20 mg at 01/17/20 2026  . OLANZapine zydis (ZYPREXA) disintegrating tablet 10 mg  10 mg Oral Q8H PRN Cobos, Myer Peer, MD   10 mg at 01/18/20 0307  . QUEtiapine (SEROQUEL) tablet 25 mg  25 mg Oral BID Cobos, Myer Peer, MD   25 mg at 01/18/20 0907   PTA Medications: Medications Prior to Admission  Medication Sig Dispense Refill Last Dose  . acetaminophen (TYLENOL) 500 MG tablet Take 1,000 mg by mouth every 6 (six) hours as needed for moderate pain.   Past Month at Unknown time  . albuterol (VENTOLIN HFA) 108 (90 Base) MCG/ACT inhaler Inhale 2 puffs into the lungs every 4 (four) hours as needed for wheezing or shortness of breath. 18 g 1 Past Month at Unknown time  . ALPRAZolam (XANAX) 1 MG tablet Take 1 mg by mouth 3 (three) times daily.    Past Month at Unknown time  . diclofenac sodium (VOLTAREN) 1 % GEL Apply 4 g topically 4 (four) times daily as needed. 500 g 6 Past Month at Unknown time  .  HYDROcodone-acetaminophen (NORCO) 10-325 MG tablet Take 1-2 tablets by mouth every 6 (six) hours as needed for moderate pain.   Past Month at Unknown time  . mupirocin ointment (BACTROBAN) 2 % Place 1 application into the nose 2 (two) times daily. 22 g 3 Past Month at Unknown time  . OLANZapine (ZYPREXA) 10 MG tablet Take 40 mg by mouth at bedtime.    Past Month at Unknown time  . perphenazine (TRILAFON) 4 MG tablet Take 1 tablet (4 mg total) by mouth 2 (two) times daily.   Past Month at Unknown time  . potassium chloride SA (KLOR-CON) 20 MEQ tablet Take 1 tablet (20 mEq total) by mouth 2 (two) times daily. 20 tablet 0 Past Month at Unknown time  . QUEtiapine (SEROQUEL) 25 MG tablet Take 75 mg by mouth daily.   Past Month at Unknown time  . acetaminophen (TYLENOL) 325 MG tablet Take 2 tablets (650 mg total) by mouth every 6 (six) hours as needed for mild pain, fever or headache. (Patient not taking: Reported on 01/17/2020)   Completed Course at Unknown time  . azithromycin (ZITHROMAX) 250 MG tablet Take 1 tablet (250 mg total) by mouth daily. Take first 2 tablets together, then 1 every day until finished. (Patient not taking: Reported on 01/16/2020) 6 tablet  0 Completed Course at Unknown time  . fluPHENAZine decanoate (PROLIXIN) 25 MG/ML injection Inject into the muscle.       Patient Stressors: Financial difficulties Health problems Legal issue Medication change or noncompliance Substance abuse  Patient Strengths: Supportive family/friends  Treatment Modalities: Medication Management, Group therapy, Case management,  1 to 1 session with clinician, Psychoeducation, Recreational therapy.   Physician Treatment Plan for Primary Diagnosis: Schizophrenia, paranoid type ([place]) Long Term Goal(s): Improvement in symptoms so as ready for discharge Improvement in symptoms so as ready for discharge   Short Term Goals: Ability to identify changes in lifestyle to reduce recurrence of condition will  improve Ability to identify and develop effective coping behaviors will improve Ability to demonstrate self-control will improve Ability to identify triggers associated with substance abuse/mental health issues will improve  Medication Management: Evaluate patient's response, side effects, and tolerance of medication regimen.  Therapeutic Interventions: 1 to 1 sessions, Unit Group sessions and Medication administration.  Evaluation of Outcomes: Not Met  Physician Treatment Plan for Secondary Diagnosis: Principal Problem:   Schizophrenia, paranoid type ([place])  Long Term Goal(s): Improvement in symptoms so as ready for discharge Improvement in symptoms so as ready for discharge   Short Term Goals: Ability to identify changes in lifestyle to reduce recurrence of condition will improve Ability to identify and develop effective coping behaviors will improve Ability to demonstrate self-control will improve Ability to identify triggers associated with substance abuse/mental health issues will improve     Medication Management: Evaluate patient's response, side effects, and tolerance of medication regimen.  Therapeutic Interventions: 1 to 1 sessions, Unit Group sessions and Medication administration.  Evaluation of Outcomes: Not Met   RN Treatment Plan for Primary Diagnosis: Schizophrenia, paranoid type ([place]) Long Term Goal(s): Knowledge of disease and therapeutic regimen to maintain health will improve  Short Term Goals: Ability to verbalize frustration and anger appropriately will improve, Ability to verbalize feelings will improve, Ability to identify and develop effective coping behaviors will improve and Compliance with prescribed medications will improve  Medication Management: RN will administer medications as ordered by provider, will assess and evaluate patient's response and provide education to patient for prescribed medication. RN will report any adverse and/or side effects to  prescribing provider.  Therapeutic Interventions: 1 on 1 counseling sessions, Psychoeducation, Medication administration, Evaluate responses to treatment, Monitor vital signs and CBGs as ordered, Perform/monitor CIWA, COWS, AIMS and Fall Risk screenings as ordered, Perform wound care treatments as ordered.  Evaluation of Outcomes: Not Met   LCSW Treatment Plan for Primary Diagnosis: Schizophrenia, paranoid type ([place]) Long Term Goal(s): Safe transition to appropriate next level of care at discharge, Engage patient in therapeutic group addressing interpersonal concerns.  Short Term Goals: Engage patient in aftercare planning with referrals and resources, Increase social support, Increase emotional regulation, Identify triggers associated with mental health/substance abuse issues and Increase skills for wellness and recovery  Therapeutic Interventions: Assess for all discharge needs, 1 to 1 time with Social worker, Explore available resources and support systems, Assess for adequacy in community support network, Educate family and significant other(s) on suicide prevention, Complete Psychosocial Assessment, Interpersonal group therapy.  Evaluation of Outcomes: Not Met  Progress in Treatment: Attending groups: Yes. Participating in groups: Yes. Taking medication as prescribed: Yes. Med seeking.  Toleration medication: Yes. Family/Significant other contact made: No, will contact:  declined collateral consents. Patient understands diagnosis: No. Discussing patient identified problems/goals with staff: Yes. Medical problems stabilized or resolved: Yes. Denies suicidal/homicidal ideation: Yes.  Issues/concerns per patient self-inventory: Yes.  New problem(s) identified: Yes, Describe:  limited social supports, medication non-compliance.  New Short Term/Long Term Goal(s): medication management for mood stabilization; elimination of SI thoughts; development of comprehensive mental  wellness/sobriety plan.  Patient Goals:  "Discharge."  Discharge Plan or Barriers: Patient has his own apartment in [place]. Is established with [facility], but declines their services at this time. CSW assessing for appropriate referrals.   Reason for Continuation of Hospitalization: Aggression Delusions  Hallucinations Medication stabilization  Estimated Length of Stay: 5-7 days  Attendees: Patient: [name] 01/18/2020 9:21 AM  Physician:  01/18/2020 9:21 AM  Nursing:  01/18/2020 9:21 AM  RN Care Manager: 01/18/2020 9:21 AM  Social Worker: Stephanie Acre, [place] 01/18/2020 9:21 AM  Recreational Therapist:  01/18/2020 9:21 AM  Other: Marvia Pickles, NP 01/18/2020 9:21 AM  Other: Darletta Moll, [place] 01/18/2020 9:21 AM  Other: 01/18/2020 9:21 AM    Scribe for Treatment Team: Joellen Jersey, [place] 01/18/2020 9:21 AM

## 2020-01-18 NOTE — Plan of Care (Signed)
Nurse discussed patient's behavior with him this morning and this afternoon.  Nurse asked patient not to scream, yell at other patients and staff.

## 2020-01-19 MED ORDER — NICOTINE POLACRILEX 2 MG MT GUM
2.0000 mg | CHEWING_GUM | OROMUCOSAL | Status: DC | PRN
  Administered 2020-01-19 – 2020-01-31 (×4): 2 mg via ORAL
  Filled 2020-01-19: qty 1
  Filled 2020-01-19: qty 10

## 2020-01-19 MED ORDER — PALIPERIDONE PALMITATE ER 234 MG/1.5ML IM SUSY
234.0000 mg | PREFILLED_SYRINGE | INTRAMUSCULAR | Status: DC
  Administered 2020-01-19: 234 mg via INTRAMUSCULAR
  Filled 2020-01-19: qty 1.5

## 2020-01-19 NOTE — Progress Notes (Signed)
Pt says he is having a panic attack and can't find his clothes in his room. Pt given medication for anxiety.

## 2020-01-19 NOTE — BHH Counselor (Signed)
CSW established contact with [facility]. ACT Team is aware of patient's inpatient admission and agree to follow up with patient at discharge. [facility] will need to be updated of discharge plans to meet with patient in the community within 24 hours of discharge.  Stephanie Acre, MSW, LCSW-A Clinical Social Worker [facility]

## 2020-01-19 NOTE — Progress Notes (Signed)
Pt at nurse's station asking "you think my doctor will change my prescription to [place]?" Pt told that he must inform his provider about any changes he wishes to be made. Told pt that writer would leave this note as well.

## 2020-01-19 NOTE — Progress Notes (Signed)
[facility] MD Progress Note  01/19/2020 10:24 AM [name]  MRN:  [id number]   Subjective: Follow-up for this 49 year old male diagnosed with schizophrenia.  Patient reports that he is doing better today.  He feels that the medications are working quite well.  He does request to be restarted on [place] because he has been in the past and he stated that it worked well for him.  He also reports that he plans to follow back up with [facility] and they will continue giving him his medications.  Currently he is denying any suicidal or homicidal ideations and denies any hallucinations he also denies any agitation today.  Patient has been compliant with medications but still has some paranoid thoughts about the nurses change in the medications without the providers knowing about it.  Principal Problem: Schizophrenia, paranoid type ([place]) Diagnosis: Principal Problem:   Schizophrenia, paranoid type ([place])  Total Time spent with patient: 30 minutes  Past Psychiatric History: Schziophrenia with history of multiple inpatient admissions. Most recent attmept was 11/2018 were he overdosed on his psychotropic medications and spent 10 days at [facility] Reports over 60 suicide attempts. He has tried and failed multiple antipsychotics as noted on previous admissions.  Past Medical History:  Past Medical History:  Diagnosis Date  . Asthma   . CHF (congestive heart failure) ([place])   . Chronic back pain   . COPD (chronic obstructive pulmonary disease) ([place])   . Knee pain, chronic   . Migraines   . Schizophrenia, schizo-affective ([place])     Past Surgical History:  Procedure Laterality Date  . KNEE SURGERY     four times  . WISDOM TOOTH EXTRACTION     Family History:  Family History  Problem Relation Age of Onset  . Hypertension Father    Family Psychiatric  History: Denies Social History:  Social History   Substance and Sexual Activity  Alcohol Use No   Comment: occ      Social History   Substance and Sexual Activity  Drug Use No   Comment: Pt denied    Social History   Socioeconomic History  . Marital status: Widowed    Spouse name: Not on file  . Number of children: Not on file  . Years of education: Not on file  . Highest education level: Not on file  Occupational History  . Occupation: on disability  Tobacco Use  . Smoking status: Current Every Day Smoker    Packs/day: 1.00    Years: 33.00    Pack years: 33.00    Types: Cigarettes  . Smokeless tobacco: Never Used  Substance and Sexual Activity  . Alcohol use: No    Comment: occ  . Drug use: No    Comment: Pt denied  . Sexual activity: Not Currently  Other Topics Concern  . Not on file  Social History Narrative   Pt is a widower.  He lives alone, and he receives SSI Disability.  Pt was followed by Dr. Darleene Cleaver, but he has not seen Dr. Darleene Cleaver in several months.   Social Determinants of Health   Financial Resource Strain:   . Difficulty of Paying Living Expenses:   Food Insecurity:   . Worried About Charity fundraiser in the Last Year:   . Arboriculturist in the Last Year:   Transportation Needs:   . Film/video editor (Medical):   Marland Kitchen Lack of Transportation (Non-Medical):   Physical Activity:   .  Days of Exercise per Week:   . Minutes of Exercise per Session:   Stress:   . Feeling of Stress :   Social Connections:   . Frequency of Communication with Friends and Family:   . Frequency of Social Gatherings with Friends and Family:   . Attends Religious Services:   . Active Member of Clubs or Organizations:   . Attends Archivist Meetings:   Marland Kitchen Marital Status:    Additional Social History:                         Sleep: Poor  Appetite:  Good  Current Medications: Current Facility-Administered Medications  Medication Dose Route Frequency Provider Last Rate Last Admin  . albuterol (VENTOLIN HFA) 108 (90 Base) MCG/ACT inhaler 2 puff  2 puff  Inhalation Q4H PRN Mordecai Maes, NP      . ALPRAZolam [name]) tablet 1 mg  1 mg Oral TID Aaditya Letizia, [name] Ram, FNP   1 mg at 01/19/20 [id number]  . diphenhydrAMINE (BENADRYL) capsule 50 mg  50 mg Oral Q6H PRN Kaysan Peixoto, Darnelle Maffucci B, FNP       Or  . diphenhydrAMINE (BENADRYL) injection 50 mg  50 mg Intramuscular Q6H PRN Ricky Doan, [name] Ram, FNP      . HYDROcodone-acetaminophen (NORCO/VICODIN) 5-325 MG per tablet 1 tablet  1 tablet Oral TID PRN Cobos, Myer Peer, MD   1 tablet at 01/19/20 0701  . lidocaine (LIDODERM) 5 % 1 patch  1 patch Transdermal Q24H Cobos, Myer Peer, MD   1 patch at 01/18/20 1114  . LORazepam (ATIVAN) tablet 2 mg  2 mg Oral Q6H PRN Jarrod Bodkins, [name] Ram, FNP   2 mg at 01/19/20 0910   Or  . LORazepam (ATIVAN) injection 2 mg  2 mg Intramuscular Q6H PRN Dardan Shelton, [name] Ram, FNP   2 mg at 01/18/20 1535  . nicotine polacrilex (NICORETTE) gum 2 mg  2 mg Oral PRN Nichael Ehly, [name] Ram, FNP   2 mg at 01/19/20 0910  . paliperidone (INVEGA SUSTENNA) injection 234 mg  234 mg Intramuscular Q28 days Coila Wardell, [name] Ram, [place]   234 mg at 01/19/20 0910  . risperiDONE (RISPERDAL) tablet 2 mg  2 mg Oral BID Taneeka Curtner, [name] Ram, FNP   2 mg at 01/19/20 [id number]  . ziprasidone (GEODON) injection 20 mg  20 mg Intramuscular Q12H PRN Sarahanne Novakowski, [name] Ram, FNP   20 mg at 01/18/20 1535    Lab Results:  Results for orders placed or performed during the hospital encounter of 01/16/20 (from the past 48 hour(s))  Hemoglobin A1c     Status: None   Collection Time: 01/17/20  6:29 PM  Result Value Ref Range   Hgb A1c MFr Bld 5.6 4.8 - 5.6 %    Comment: (NOTE) Pre diabetes:          5.7%-6.4% Diabetes:              >6.4% Glycemic control for   <7.0% adults with diabetes    Mean Plasma Glucose 114.02 mg/dL    Comment: Performed at [facility] [address], [place], [place] [address]  Lipid panel     Status: Abnormal   Collection Time: 01/17/20  6:29 PM  Result Value Ref Range   Cholesterol 154 0 - 200 mg/dL   Triglycerides 186 (H) <150  mg/dL   HDL 33 (L) >40 mg/dL   Total CHOL/HDL Ratio 4.7 RATIO   VLDL 37 0 -  40 mg/dL   LDL Cholesterol 84 0 - 99 mg/dL    Comment:        Total Cholesterol/HDL:CHD Risk Coronary Heart Disease Risk Table                     Men   Women  1/2 Average Risk   3.4   3.3  Average Risk       5.0   4.4  2 X Average Risk   9.6   7.1  3 X Average Risk  23.4   11.0        Use the calculated Patient Ratio above and the CHD Risk Table to determine the patient's CHD Risk.        ATP III CLASSIFICATION (LDL):  <100     mg/dL   Optimal  100-129  mg/dL   Near or Above                    Optimal  130-159  mg/dL   Borderline  160-189  mg/dL   High  >190     mg/dL   Very High Performed at [facility] [address] [place], [place] [address]   Prolactin     Status: Abnormal   Collection Time: 01/17/20  6:29 PM  Result Value Ref Range   Prolactin 16.1 (H) 4.0 - 15.2 ng/mL    Comment: (NOTE) Performed At: [facility] [place], [place] [id number] Rush Farmer MD [id number]   TSH     Status: None   Collection Time: 01/17/20  6:29 PM  Result Value Ref Range   TSH 1.301 0.350 - 4.500 uIU/mL    Comment: Performed by a 3rd Generation assay with a functional sensitivity of <=0.01 uIU/mL. Performed at [facility], [place] [address], [place], [place] [address]     Blood Alcohol level:  Lab Results  Component Value Date   [facility] <10 01/15/2020   ETH <10 [id number]    Metabolic Disorder Labs: Lab Results  Component Value Date   HGBA1C 5.6 01/17/2020   MPG 114.02 01/17/2020   MPG 111.15 06/25/2017   Lab Results  Component Value Date   PROLACTIN 16.1 (H) 01/17/2020   PROLACTIN 42.9 (H) 08/19/2017   Lab Results  Component Value Date   CHOL 154 01/17/2020   TRIG 186 (H) 01/17/2020   HDL 33 (L) 01/17/2020   CHOLHDL 4.7 01/17/2020   VLDL 37 01/17/2020   LDLCALC 84 01/17/2020   LDLCALC 126 (H) 06/25/2017    Physical  Findings: AIMS:  , ,  ,  ,    CIWA:    COWS:     Musculoskeletal: Strength & Muscle Tone: within normal limits Gait & Station: normal Patient leans: N/A  Psychiatric Specialty Exam: Physical Exam  Nursing note and vitals reviewed. Constitutional: He is oriented to person, place, and time. He appears well-developed and well-nourished.  Cardiovascular: Normal rate.  Respiratory: Effort normal.  Musculoskeletal:        General: Normal range of motion.  Neurological: He is alert and oriented to person, place, and time.  Skin: Skin is warm.    Review of Systems  Constitutional: Negative.   HENT: Negative.   Eyes: Negative.   Respiratory: Negative.   Cardiovascular: Negative.   Gastrointestinal: Negative.   Genitourinary: Negative.   Musculoskeletal: Negative.   Skin: Negative.   Neurological: Negative.   Psychiatric/Behavioral: Negative.     Blood pressure Marland Kitchen)  143/97, pulse 68, temperature 98 F (36.7 C), temperature source Oral, resp. rate 18, height 6\' 1"  (1.854 m), weight 85.7 kg, SpO2 98 %.Body mass index is 24.94 kg/m.  General Appearance: Disheveled  Eye Contact:  Fair  Speech:  Clear and Coherent and Normal Rate  Volume:  Normal  Mood:  Anxious  Affect:  Constricted  Thought Process:  Coherent and Descriptions of Associations: Circumstantial  Orientation:  Full (Time, Place, and Person)  Thought Content:  WDL and Paranoid Ideation  Suicidal Thoughts:  No  Homicidal Thoughts:  No  Memory:  Immediate;   Fair Recent;   Fair Remote;   Fair  Judgement:  Fair  Insight:  Fair  Psychomotor Activity:  Normal  Concentration:  Concentration: Fair  Recall:  [employer] of Knowledge:  Fair  Language:  Fair  Akathisia:  No  Handed:  Right  AIMS (if indicated):     Assets:  Communication Skills Desire for Improvement Financial Resources/Insurance Housing Resilience  ADL's:  Intact  Cognition:  WNL  Sleep:  Number of Hours: 2.75   Assessment: Patient presents  interacting with peers and staff in the milieu appropriately.  Based on chart review yesterday patient was having auditory hallucinations and was requiring some IM medications due to his aggression and yelling out.  Patient has been much more calmer today and is actually requesting additional medications that have worked for him in the past.  I am completely in agreement with starting the [place] today and will then him follow-up with his acting when he leaves.  Patient continues to request his unit restriction to be dropped and states he understands that if he shows a good solid day of being cooperative and not having any outbursts that we will consider letting him off the unit restriction.  Patient has remained calm today thus far.Sleep has been poor with a reported 2.75 hours, however the patient slept a lot yesterday due to IM medications  Treatment Plan Summary: Daily contact with patient to assess and evaluate symptoms and progress in treatment and Medication management Continue Risperdal 2 mg p.o. twice daily for schizophrenia Start Invega Sustenna 234 mg IM q. 28 days for schizophrenia Continue Xanax 1 mg p.o. 3 times daily for anxiety Continue hydrocodone 5-3 25 3  times daily as needed for moderate pain Start Nicorette gum for smoking cessation Continue agitation protocol of Benadryl, Ativan, and Geodon Encourage group therapy participation Continue every 15 minute safety checks  [name] Ram Amonda Brillhart, FNP 01/19/2020, 10:24 AM

## 2020-01-19 NOTE — Progress Notes (Signed)
Recreation Therapy Notes  Date: 5.13.21 Time: 1000 Location: 500 Hall  Group Topic: Coping Skills  Goal Area(s) Addresses:  Patient will identify positive coping skills. Patient will identify benefit of using coping skills post d/c.  Intervention: Game  Activity: Scientist, clinical (histocompatibility and immunogenetics).  Patients would play game according to regular rules.  In addition to identifying a coping skill for each piece they move.  Patients could not repeat any coping skill that had been named.  If the [place] tower fell over, the game would start over.  Education: Radiographer, therapeutic, Dentist.   Education Outcome: Acknowledges understanding/In group clarification offered/Needs additional education.   Clinical Observations/Feedback: Pt did not attend group session.    Victorino Sparrow, LRT/CTRS     Ria Comment, Lorrane Mccay A 01/19/2020 11:20 AM

## 2020-01-19 NOTE — Progress Notes (Signed)
Patient states that what he learned today is to remember the coping skills that he was taught in the past. His goal for tomorrow is to talk to someone about his pain.

## 2020-01-19 NOTE — [facility] Group Notes (Signed)
[facility] Mental [facility] Association Group Therapy 01/19/2020 2:33 PM  Type of Therapy: Mental Health Association Presentation  Participation Level: Active  Participation Quality: Attentive  Affect: Appropriate  Cognitive: Oriented  Insight: Developing/Improving  Engagement in Therapy: Engaged  Modes of Intervention: Discussion, Education and Socialization   Summary of Progress/Problems: [place] Speaker came to talk about his personal journey with mental health. The pt processed ways by which to relate to the speaker. [place] speaker provided handouts and educational information pertaining to groups and services offered by the [facility]. Pt was engaged in speaker's presentation and was receptive to resources provided.   Stephanie Acre, MSW, [facility] 01/19/2020 2:33 PM

## 2020-01-19 NOTE — Progress Notes (Signed)
Pt repeatedly returning to nurse's station throughout the night. Pt redirected each time to return to his room. Pt has not slept tonight despite being given scheduled meds and PRN for anxiety.

## 2020-01-20 MED ORDER — DIVALPROEX SODIUM 500 MG PO DR TAB
500.0000 mg | DELAYED_RELEASE_TABLET | Freq: Two times a day (BID) | ORAL | Status: DC
  Filled 2020-01-20 (×6): qty 1

## 2020-01-20 NOTE — Progress Notes (Signed)
   01/20/20 1114  Psych Admission Type (Psych Patients Only)  Admission Status Involuntary  Psychosocial Assessment  Patient Complaints Anxiety;Irritability;Restlessness;Sleep disturbance  Eye Contact Avoids  Facial Expression Anxious  Affect Anxious;Depressed  Speech Pressured;Slurred  Interaction Demanding;Intrusive;Defensive;Dominating  Motor Activity Slow  Appearance/Hygiene Unremarkable  Behavior Characteristics Anxious;Fidgety;Intrusive;Irritable;Pacing  Mood Labile  Thought Process  Coherency Disorganized;Tangential;Loose associations  Content Blaming others  Delusions Paranoid  Perception UTA (pt observed sitting in day room talking to self)  Hallucination None reported or observed (pt talking to self as if responding to internal stimuli)  Judgment Impaired  Confusion Mild  Danger to Self  Current suicidal ideation? Denies  Self-Injurious Behavior No self-injurious ideation or behavior indicators observed or expressed   Agreement Not to Harm Self Yes  Description of Agreement Verbally contracts for safety  Danger to Others  Danger to Others None reported or observed

## 2020-01-20 NOTE — Progress Notes (Signed)
Pt continues to come out of his room and stand at the nurse's station talking all night. Pt redirected to return to his room. Pt argumentative and blaming staff because he has outbursts. Pt wants to sit in the floor in the hallway instead of in his room. Pt told this was not allowed. Pt finally walked back into his room.

## 2020-01-20 NOTE — Progress Notes (Signed)
[facility] MD Progress Note  01/20/2020 1:17 PM [name]  MRN:  [id number]   Subjective: Follow-up for this 49 year old male diagnosed with schizophrenia.  Patient reports today that he is upset and angry because he feels that the medications keep getting changed.  He continues to report that the nurses are trying to overrun this place and they are not giving him the medications that have been prescribed to him.  He continues yelling and screaming that he needs to have 20 mg of Roxicodone twice a day and he will will be the best he is ever been.  He also reports that he has blood cancer and if we would take blood samples we would see that he has blood cancer and that the Roxicodone will fix that problem.  Patient denies any suicidal or homicidal ideations and denies having any paranoia or delusional thoughts.  Principal Problem: Schizophrenia, paranoid type ([place]) Diagnosis: Principal Problem:   Schizophrenia, paranoid type ([place])  Total Time spent with patient: 30 minutes  Past Psychiatric History: Schziophrenia with history of multiple inpatient admissions. Most recent attmept was 11/2018 were he overdosed on his psychotropic medications and spent 10 days at [facility]. Reports over 60 suicide attempts. He has tried and failed multiple antipsychotics as noted on previous admissions.  Past Medical History:  Past Medical History:  Diagnosis Date  . Asthma   . CHF (congestive heart failure) ([place])   . Chronic back pain   . COPD (chronic obstructive pulmonary disease) ([place])   . Knee pain, chronic   . Migraines   . Schizophrenia, schizo-affective ([place])     Past Surgical History:  Procedure Laterality Date  . KNEE SURGERY     four times  . WISDOM TOOTH EXTRACTION     Family History:  Family History  Problem Relation Age of Onset  . Hypertension Father    Family Psychiatric  History: Denies Social History:  Social History   Substance and Sexual Activity  Alcohol Use No   Comment:  occ     Social History   Substance and Sexual Activity  Drug Use No   Comment: Pt denied    Social History   Socioeconomic History  . Marital status: Widowed    Spouse name: Not on file  . Number of children: Not on file  . Years of education: Not on file  . Highest education level: Not on file  Occupational History  . Occupation: on disability  Tobacco Use  . Smoking status: Current Every Day Smoker    Packs/day: 1.00    Years: 33.00    Pack years: 33.00    Types: Cigarettes  . Smokeless tobacco: Never Used  Substance and Sexual Activity  . Alcohol use: No    Comment: occ  . Drug use: No    Comment: Pt denied  . Sexual activity: Not Currently  Other Topics Concern  . Not on file  Social History Narrative   Pt is a widower.  He lives alone, and he receives SSI Disability.  Pt was followed by Dr. Darleene Cleaver, but he has not seen Dr. Darleene Cleaver in several months.   Social Determinants of Health   Financial Resource Strain:   . Difficulty of Paying Living Expenses:   Food Insecurity:   . Worried About Charity fundraiser in the Last Year:   . Arboriculturist in the Last Year:   Transportation Needs:   . Film/video editor (Medical):   Marland Kitchen Lack of  Transportation (Non-Medical):   Physical Activity:   . Days of Exercise per Week:   . Minutes of Exercise per Session:   Stress:   . Feeling of Stress :   Social Connections:   . Frequency of Communication with Friends and Family:   . Frequency of Social Gatherings with Friends and Family:   . Attends Religious Services:   . Active Member of Clubs or Organizations:   . Attends Archivist Meetings:   Marland Kitchen Marital Status:    Additional Social History:                         Sleep: Poor  Appetite:  Fair  Current Medications: Current Facility-Administered Medications  Medication Dose Route Frequency Provider Last Rate Last Admin  . albuterol (VENTOLIN HFA) 108 (90 Base) MCG/ACT inhaler 2 puff  2  puff Inhalation Q4H PRN Mordecai Maes, NP      . ALPRAZolam [name]) tablet 1 mg  1 mg Oral TID Agam Tuohy, Lowry Ram, FNP   1 mg at 01/20/20 1205  . diphenhydrAMINE (BENADRYL) capsule 50 mg  50 mg Oral Q6H PRN Nohlan Burdin, Darnelle Maffucci B, FNP   50 mg at 01/20/20 0736   Or  . diphenhydrAMINE (BENADRYL) injection 50 mg  50 mg Intramuscular Q6H PRN Kiasha Bellin, Darnelle Maffucci B, FNP      . divalproex (DEPAKOTE) DR tablet 500 mg  500 mg Oral Q12H Ryenn Howeth, Lowry Ram, FNP      . HYDROcodone-acetaminophen (NORCO/VICODIN) 5-325 MG per tablet 1 tablet  1 tablet Oral TID PRN Cobos, Myer Peer, MD   1 tablet at 01/20/20 1205  . lidocaine (LIDODERM) 5 % 1 patch  1 patch Transdermal Q24H Cobos, Myer Peer, MD   1 patch at 01/20/20 0958  . LORazepam (ATIVAN) tablet 2 mg  2 mg Oral Q6H PRN Biddie Sebek, Lowry Ram, FNP   2 mg at 01/20/20 0736   Or  . LORazepam (ATIVAN) injection 2 mg  2 mg Intramuscular Q6H PRN Markis Langland, Lowry Ram, FNP   2 mg at 01/18/20 1535  . nicotine polacrilex (NICORETTE) gum 2 mg  2 mg Oral PRN Dam Ashraf, Lowry Ram, FNP   2 mg at 01/19/20 2115  . paliperidone (INVEGA SUSTENNA) injection 234 mg  234 mg Intramuscular Q28 days Deovion Batrez, Lowry Ram, [place]   234 mg at 01/19/20 0910  . risperiDONE (RISPERDAL) tablet 2 mg  2 mg Oral BID Montez Stryker, Lowry Ram, FNP   2 mg at 01/20/20 0736  . ziprasidone (GEODON) injection 20 mg  20 mg Intramuscular Q12H PRN Keydi Giel, Lowry Ram, FNP   20 mg at 01/20/20 0315    Lab Results: No results found for this or any previous visit (from the past 48 hour(s)).  Blood Alcohol level:  Lab Results  Component Value Date   ETH <10 01/15/2020   ETH <10 [id number]    Metabolic Disorder Labs: Lab Results  Component Value Date   HGBA1C 5.6 01/17/2020   MPG 114.02 01/17/2020   MPG 111.15 06/25/2017   Lab Results  Component Value Date   PROLACTIN 16.1 (H) 01/17/2020   PROLACTIN 42.9 (H) 08/19/2017   Lab Results  Component Value Date   CHOL 154 01/17/2020   TRIG 186 (H) 01/17/2020   HDL 33 (L) 01/17/2020   CHOLHDL  4.7 01/17/2020   VLDL 37 01/17/2020   LDLCALC 84 01/17/2020   LDLCALC 126 (H) 06/25/2017    Physical Findings: AIMS:  , ,  ,  ,  CIWA:    COWS:     Musculoskeletal: Strength & Muscle Tone: within normal limits Gait & Station: normal Patient leans: N/A  Psychiatric Specialty Exam: Physical Exam  Nursing note and vitals reviewed. Constitutional: He is oriented to person, place, and time. He appears well-developed and well-nourished.  Cardiovascular: Normal rate.  Respiratory: Effort normal.  Musculoskeletal:        General: Normal range of motion.  Neurological: He is alert and oriented to person, place, and time.  Skin: Skin is warm.  Psychiatric: His affect is angry. His speech is tangential. He is agitated, aggressive and actively hallucinating. Thought content is paranoid and delusional. Cognition and memory are impaired. He expresses impulsivity.    Review of Systems  Constitutional: Negative.   HENT: Negative.   Eyes: Negative.   Respiratory: Negative.   Cardiovascular: Negative.   Gastrointestinal: Negative.   Genitourinary: Negative.   Musculoskeletal: Negative.   Skin: Negative.   Neurological: Negative.   Psychiatric/Behavioral: Positive for behavioral problems and sleep disturbance.    Blood pressure (!) 139/92, pulse 93, temperature 99.1 F (37.3 C), temperature source Oral, resp. rate 18, height 6\' 1"  (1.854 m), weight 85.7 kg, SpO2 99 %.Body mass index is 24.94 kg/m.  General Appearance: Disheveled  Eye Contact:  Fair  Speech:  Clear and Coherent  Volume:  Increased  Mood:  Angry and Irritable  Affect:  Constricted  Thought Process:  Disorganized, Goal Directed and Descriptions of Associations: Circumstantial  Orientation:  Full (Time, Place, and Person)  Thought Content:  Delusions, Paranoid Ideation and Tangential  Suicidal Thoughts:  No  Homicidal Thoughts:  No  Memory:  Immediate;   Fair Recent;   Fair Remote;   Fair  Judgement:  Impaired   Insight:  Shallow  Psychomotor Activity:  Normal  Concentration:  Concentration: Fair  Recall:  [employer] of Knowledge:  Poor  Language:  Fair  Akathisia:  No  Handed:  Right  AIMS (if indicated):     Assets:  Financial Resources/Insurance Housing Resilience  ADL's:  Impaired  Cognition:  WNL  Sleep:  Number of Hours: 2   Assessment: Patient is seen today and he is irritable, angry and has been punching walls again.  Patient was reported not to sleep very well last night and has been up acting aggressive today.  Based off the report patient had received most of his IM injections trying to get him to calm down and is also taking his scheduled medications of Risperdal 2 mg twice daily.  Attempted to discuss with patient about starting a mood stabilizer such as Depakote and the patient continues that he is not interested.  Patient will be placed back on unit restriction due to behavior and aggressiveness.  We will continue to discuss with patient to start on Depakote it has been ordered Depakote DR 500 mg p.o. twice daily will also continue the Risperdal 2 mg p.o. twice daily and will dose plan to give him his second injection Invega Sustenna 156 mg IM on Monday.  Was also reported that the patient has continued to be paranoid, delusional, reporting seeing demons in his room and appears to be internally preoccupied  Treatment Plan Summary: Daily contact with patient to assess and evaluate symptoms and progress in treatment and Medication management Continue Xanax 1 mg p.o. 3 times daily for anxiety Can continue Risperdal 2 mg p.o. twice daily for schizophrenia Continue Invega Sustenna 234 mg IM q. 28 days with last dose on 01/19/2020 Start  Depakote DR 500 mg p.o. every 12 hours and continue to encourage patient to take medication Continue agitation protocol of Geodon, Ativan, and Benadryl, encourage group therapy participation Continue unit restriction order Continue every 15 minute safety  checks  Lewis Shock, FNP 01/20/2020, 1:17 PM

## 2020-01-20 NOTE — Progress Notes (Signed)
Recreation Therapy Notes  Date: 5.14.21 Time: 1000 Location: [address]  Group Topic: Communication, Team Building, Problem Solving  Goal Area(s) Addresses:  Patient will effectively work with peer towards shared goal.  Patient will identify skill used to make activity successful.  Patient will identify how skills used during activity can be used to reach post d/c goals.   Intervention: STEM Activity   Activity:  In groups, patients were asked to build a freestanding tower as tall as they could that could stand on its own using 12 pipe cleaners.  Along the way, the groups will experience budget cuts in which they will lose the use of one hand and the ability to talk to each other.  Patients will regain the use of these functions when money is added back to the budget.  Education: Education officer, community, Dentist.   Education Outcome: Acknowledges education  Clinical Observations/Feedback: Pt did not attend group session.      Victorino Sparrow, LRT/CTRS         Victorino Sparrow A 01/20/2020 11:54 AM

## 2020-01-20 NOTE — Progress Notes (Signed)
The patient is wide awake and has yet to go to sleep.

## 2020-01-20 NOTE — Progress Notes (Signed)
   01/19/20 1940  COVID-19 Daily Checkoff  Have you had a fever (temp > 37.80C/100F)  in the past 24 hours?  No  COVID-19 EXPOSURE  Have you traveled outside the state in the past 14 days? No  Have you been in contact with someone with a confirmed diagnosis of COVID-19 or PUI in the past 14 days without wearing appropriate PPE? No  Have you been living in the same home as a person with confirmed diagnosis of COVID-19 or a PUI (household contact)? No  Have you been diagnosed with COVID-19? No

## 2020-01-20 NOTE — Progress Notes (Signed)
   01/20/20 1114  Psych Admission Type (Psych Patients Only)  Admission Status Involuntary  Psychosocial Assessment  Patient Complaints Anxiety;Irritability;Restlessness;Sleep disturbance  Eye Contact Avoids  Facial Expression Anxious  Affect Anxious;Depressed  Speech Pressured;Slurred  Interaction Assertive  Motor Activity Slow  Appearance/Hygiene Unremarkable  Behavior Characteristics Anxious;Fidgety;Intrusive;Irritable;Pacing  Mood Labile  Thought Process  Coherency Disorganized;Tangential;Loose associations  Content Blaming others  Delusions Paranoid  Perception UTA (pt observed sitting in day room talking to self)  Hallucination None reported or observed (pt talking to self as if responding to internal stimuli)  Judgment Impaired  Confusion Mild  Danger to Self  Current suicidal ideation? Denies  Self-Injurious Behavior No self-injurious ideation or behavior indicators observed or expressed   Agreement Not to Harm Self Yes  Description of Agreement Verbally contracts for safety  Danger to Others  Danger to Others None reported or observed

## 2020-01-20 NOTE — Progress Notes (Signed)
DAR NOTE: Patient presents with anxious affect and depressed mood. Pt observed sitting in the dayroom interacting with peers at the beginning of the shift. Pt stated his day was good during initial  interaction with staff. Pt state he is here because his son IVC him. Pt also noted to be responding to internal stimuli but when asked if he is hearing voices, pt denied. Pt was given ativan PRN before bed due to anxiety, then went to bed but could not sleeping complaining  of back and knee pain.  Hydrocodone 1 tab PO was given as scheduled. Pt still could not sleep, became restless, Benadryl 50 Mg PO was given 30 min later. 3 Hours later pt still could not sleep, became loud and complaining of seeing demons in the room, talking to unseen people. Pt was given 20 mg IM of Geodon which pt took willingly. Pt was the encouraged to go lay down inn bed. Pt is calm at the moment and remains safe. Maintained on routine safety checks.   Support and encouragement offered as needed, will continue to monitor.

## 2020-01-20 NOTE — Progress Notes (Signed)
Pt out of his room again. States "I need something for anxiety. When can I have something for anxiety?" Pt refused Benadryl. "It makes my eyes dry and I'll be blind." Pt told that Benadryl was all he could have. Pt came back and agreed to take Benadryl 50 mg.

## 2020-01-21 MED ORDER — MAGNESIUM HYDROXIDE 400 MG/5ML PO SUSP
30.0000 mL | Freq: Every day | ORAL | Status: DC | PRN
  Administered 2020-01-21 – 2020-01-26 (×2): 30 mL via ORAL

## 2020-01-21 MED ORDER — WHITE PETROLATUM EX OINT
TOPICAL_OINTMENT | CUTANEOUS | Status: AC
  Administered 2020-01-21: 1
  Filled 2020-01-21: qty 5

## 2020-01-21 NOTE — Progress Notes (Signed)
Patient is awake and is attempting to bribe this Pryor Curia with money to buy him a drink.

## 2020-01-21 NOTE — Progress Notes (Signed)
[facility] MD Progress Note  01/21/2020 2:07 PM [name]  MRN:  [id number]   Subjective: Follow-up for this 49 year old male diagnosed with schizophrenia.  Patient reports that he is doing fine today.  He states that he just wants to get out of here and wants to be off the unit restriction.  Patient reports that he has been calm today and that he has been taking his medications like he supposed to except for the Depakote.  He continues to refuse to take Depakote stating it makes his eyes dry.  Agreed with patient that we will discontinue Depakote since he is going to continue refusing it but will continue his Risperdal as well as his Invega and the patient is in agreement with that today.  He denies any suicidal or homicidal ideations and denies any hallucinations today.  Principal Problem: Schizophrenia, paranoid type ([place]) Diagnosis: Principal Problem:   Schizophrenia, paranoid type ([place])  Total Time spent with patient: 20 minutes  Past Psychiatric History: Schziophrenia with history of multiple inpatient admissions. Most recent attmept was 11/2018 were he overdosed on his psychotropic medications and spent 10 days at [facility]. Reports over 60 suicide attempts. He has tried and failed multiple antipsychotics as noted on previous admissions.  Past Medical History:  Past Medical History:  Diagnosis Date  . Asthma   . CHF (congestive heart failure) ([place])   . Chronic back pain   . COPD (chronic obstructive pulmonary disease) ([place])   . Knee pain, chronic   . Migraines   . Schizophrenia, schizo-affective ([place])     Past Surgical History:  Procedure Laterality Date  . KNEE SURGERY     four times  . WISDOM TOOTH EXTRACTION     Family History:  Family History  Problem Relation Age of Onset  . Hypertension Father    Family Psychiatric  History: Denies Social History:  Social History   Substance and Sexual Activity  Alcohol Use No   Comment: occ     Social History   Substance  and Sexual Activity  Drug Use No   Comment: Pt denied    Social History   Socioeconomic History  . Marital status: Widowed    Spouse name: Not on file  . Number of children: Not on file  . Years of education: Not on file  . Highest education level: Not on file  Occupational History  . Occupation: on disability  Tobacco Use  . Smoking status: Current Every Day Smoker    Packs/day: 1.00    Years: 33.00    Pack years: 33.00    Types: Cigarettes  . Smokeless tobacco: Never Used  Substance and Sexual Activity  . Alcohol use: No    Comment: occ  . Drug use: No    Comment: Pt denied  . Sexual activity: Not Currently  Other Topics Concern  . Not on file  Social History Narrative   Pt is a widower.  He lives alone, and he receives SSI Disability.  Pt was followed by Dr. Darleene Cleaver, but he has not seen Dr. Darleene Cleaver in several months.   Social Determinants of Health   Financial Resource Strain:   . Difficulty of Paying Living Expenses:   Food Insecurity:   . Worried About Charity fundraiser in the Last Year:   . Arboriculturist in the Last Year:   Transportation Needs:   . Film/video editor (Medical):   Marland Kitchen Lack of Transportation (Non-Medical):   Physical Activity:   .  Days of Exercise per Week:   . Minutes of Exercise per Session:   Stress:   . Feeling of Stress :   Social Connections:   . Frequency of Communication with Friends and Family:   . Frequency of Social Gatherings with Friends and Family:   . Attends Religious Services:   . Active Member of Clubs or Organizations:   . Attends Archivist Meetings:   Marland Kitchen Marital Status:    Additional Social History:                         Sleep: Good  Appetite:  Good  Current Medications: Current Facility-Administered Medications  Medication Dose Route Frequency Provider Last Rate Last Admin  . albuterol (VENTOLIN HFA) 108 (90 Base) MCG/ACT inhaler 2 puff  2 puff Inhalation Q4H PRN Mordecai Maes,  NP      . ALPRAZolam [name]) tablet 1 mg  1 mg Oral TID Chade Pitner, Lowry Ram, FNP   1 mg at 01/21/20 1223  . diphenhydrAMINE (BENADRYL) capsule 50 mg  50 mg Oral Q6H PRN Latavion Halls, Darnelle Maffucci B, FNP   50 mg at 01/20/20 1709   Or  . diphenhydrAMINE (BENADRYL) injection 50 mg  50 mg Intramuscular Q6H PRN Jadarius Commons, Lowry Ram, FNP      . HYDROcodone-acetaminophen (NORCO/VICODIN) 5-325 MG per tablet 1 tablet  1 tablet Oral TID PRN Cobos, Myer Peer, MD   1 tablet at 01/21/20 0753  . lidocaine (LIDODERM) 5 % 1 patch  1 patch Transdermal Q24H Cobos, Myer Peer, MD   1 patch at 01/21/20 1221  . LORazepam (ATIVAN) tablet 2 mg  2 mg Oral Q6H PRN Oliviarose Punch, Lowry Ram, FNP   2 mg at 01/21/20 0046   Or  . LORazepam (ATIVAN) injection 2 mg  2 mg Intramuscular Q6H PRN Skye Rodarte, Lowry Ram, FNP   2 mg at 01/18/20 1535  . nicotine polacrilex (NICORETTE) gum 2 mg  2 mg Oral PRN Diego Delancey, Lowry Ram, FNP   2 mg at 01/19/20 2115  . paliperidone (INVEGA SUSTENNA) injection 234 mg  234 mg Intramuscular Q28 days Migel Hannis, Lowry Ram, [place]   234 mg at 01/19/20 0910  . risperiDONE (RISPERDAL) tablet 2 mg  2 mg Oral BID Mayah Urquidi, Lowry Ram, FNP   2 mg at 01/21/20 0752  . ziprasidone (GEODON) injection 20 mg  20 mg Intramuscular Q12H PRN Ramie Palladino, Lowry Ram, FNP   20 mg at 01/20/20 0315    Lab Results: No results found for this or any previous visit (from the past 48 hour(s)).  Blood Alcohol level:  Lab Results  Component Value Date   ETH <10 01/15/2020   ETH <10 [id number]    Metabolic Disorder Labs: Lab Results  Component Value Date   HGBA1C 5.6 01/17/2020   MPG 114.02 01/17/2020   MPG 111.15 06/25/2017   Lab Results  Component Value Date   PROLACTIN 16.1 (H) 01/17/2020   PROLACTIN 42.9 (H) 08/19/2017   Lab Results  Component Value Date   CHOL 154 01/17/2020   TRIG 186 (H) 01/17/2020   HDL 33 (L) 01/17/2020   CHOLHDL 4.7 01/17/2020   VLDL 37 01/17/2020   LDLCALC 84 01/17/2020   LDLCALC 126 (H) 06/25/2017    Physical Findings: AIMS:  , ,   ,  ,    CIWA:    COWS:     Musculoskeletal: Strength & Muscle Tone: within normal limits Gait & Station: normal Patient leans: N/A  Psychiatric Specialty  Exam: Physical Exam  Nursing note and vitals reviewed. Constitutional: He is oriented to person, place, and time. He appears well-developed and well-nourished.  Cardiovascular: Normal rate.  Respiratory: Effort normal.  Musculoskeletal:        General: Normal range of motion.  Neurological: He is alert and oriented to person, place, and time.  Skin: Skin is warm.  Psychiatric: He expresses impulsivity.    Review of Systems  Constitutional: Negative.   HENT: Negative.   Eyes: Negative.   Respiratory: Negative.   Cardiovascular: Negative.   Gastrointestinal: Negative.   Genitourinary: Negative.   Musculoskeletal: Negative.   Skin: Negative.   Neurological: Negative.   Psychiatric/Behavioral: Negative.     Blood pressure 122/87, pulse 92, temperature 99.1 F (37.3 C), temperature source Oral, resp. rate 18, height 6\' 1"  (1.854 m), weight 85.7 kg, SpO2 99 %.Body mass index is 24.94 kg/m.  General Appearance: Disheveled  Eye Contact:  Fair  Speech:  Clear and Coherent and Normal Rate  Volume:  Increased  Mood:  Irritable  Affect:  Congruent  Thought Process:  Goal Directed, Linear and Descriptions of Associations: Circumstantial  Orientation:  Full (Time, Place, and Person)  Thought Content:  WDL  Suicidal Thoughts:  No  Homicidal Thoughts:  No  Memory:  Immediate;   Fair Recent;   Fair Remote;   Fair  Judgement:  Fair  Insight:  Fair  Psychomotor Activity:  Normal  Concentration:  Concentration: Fair  Recall:  [employer] of Knowledge:  Fair  Language:  Fair  Akathisia:  No  Handed:  Right  AIMS (if indicated):     Assets:  Desire for Improvement Financial Resources/Insurance Housing Resilience  ADL's:  Intact  Cognition:  WNL  Sleep:  Number of Hours: 2   Assessment: Patient presents walking around  the milieu and is interacting with peers and staff appropriately.  Patient does have some moments of increased speech volume and appears to be irritable but is redirected easily today.  There have been no outbursts and he has not punched any walls or damaged any property today.  Patient will remain on unit restriction he is informed of this.  I did inform staff that if the patient remains calm that we may trial a trip to the cafeteria and will continue to evaluate for removing unit restriction.  Patient does seem to be med seeking at times as he continues requesting phenobarbital to replace his Depakote or to have Roxicodone 20 mg p.o. twice daily.  Treatment Plan Summary: Daily contact with patient to assess and evaluate symptoms and progress in treatment and Medication management Discontinue Depakote Continue Invega Sustenna 234 mg IM q. 28 days Continue Risperdal 2 mg p.o. twice daily for schizophrenia Plan for Invega Sustenna 156 mg booster injection on Monday Continue Xanax 1 mg p.o. 3 times daily for anxiety Continue agitation protocol of Geodon, Ativan, and Benadryl Continue Norco 5-325 mg tablets 3 times daily as needed for moderate pain Encourage group therapy participation Continue every 15 minute safety checks and unit restriction  Lewis Shock, FNP 01/21/2020, 2:07 PM

## 2020-01-21 NOTE — Progress Notes (Signed)
   01/21/20 0025  Psych Admission Type (Psych Patients Only)  Admission Status Involuntary  Psychosocial Assessment  Patient Complaints Irritability;Restlessness  Eye Contact Avoids  Facial Expression Anxious  Affect Anxious;Depressed  Speech Pressured;Slurred  Interaction Demanding;Intrusive;Defensive;Dominating  Motor Activity Slow  Appearance/Hygiene Unremarkable  Behavior Characteristics Irritable;Fidgety  Mood Labile  Thought Process  Coherency Disorganized;Tangential;Loose associations  Content Blaming others  Delusions Paranoid  Perception UTA (pt observed sitting in day room talking to self)  Hallucination None reported or observed (pt talking to self as if responding to internal stimuli)  Judgment Impaired  Confusion Mild  Danger to Self  Current suicidal ideation? Denies  Self-Injurious Behavior No self-injurious ideation or behavior indicators observed or expressed   Agreement Not to Harm Self Yes  Description of Agreement Verbally contracts for safety  Danger to Others  Danger to Others None reported or observed  D: Patient pacing up and down the dayroom. Pt irritable but redirectable.   A: Medications administered as prescribed. Support and encouragement provided as needed.  R: Patient remains safe on the unit. Will continue to monitor for safety and stability.

## 2020-01-21 NOTE — Progress Notes (Signed)
   01/21/20 0557  Vital Signs  Pulse Rate 92  BP 122/87  BP Location Right Arm  BP Method Automatic  Patient Position (if appropriate) Sitting  D: Patient presented with a depressed and anxious affect. Patient refused to take Depakote. Patient complained of indigestion milk of magnesium was given and patient reported that it relieved the symptoms. Patient  Complained of headache pain and other body aches, norco was given.  A   Support and encouragement provided Routine safety checks conducted every 15 minutes. Patient  Informed to notify staff with any concerns.  Safety maintained. R:Safety maintained

## 2020-01-21 NOTE — Progress Notes (Signed)
   01/21/20 2023  Psych Admission Type (Psych Patients Only)  Admission Status Involuntary  Psychosocial Assessment  Patient Complaints None  Eye Contact Brief  Facial Expression Anxious  Affect Anxious  Speech Pressured;Logical/coherent  Interaction Intrusive  Motor Activity Slow  Appearance/Hygiene Unremarkable  Behavior Characteristics Appropriate to situation;Cooperative  Mood Anxious;Pleasant  Thought Process  Coherency WDL  Content Blaming others  Delusions None reported or observed  Perception WDL (pt observed sitting in day room talking to self)  Hallucination None reported or observed (pt talking to self as if responding to internal stimuli)  Judgment Impaired  Confusion None  Danger to Self  Current suicidal ideation? Denies  Self-Injurious Behavior No self-injurious ideation or behavior indicators observed or expressed   Agreement Not to Harm Self Yes  Description of Agreement Verbally contracts for safety  Danger to Others  Danger to Others None reported or observed

## 2020-01-21 NOTE — Progress Notes (Signed)
   01/21/20 2020  COVID-19 Daily Checkoff  Have you had a fever (temp > 37.80C/100F)  in the past 24 hours?  No  If you have had runny nose, nasal congestion, sneezing in the past 24 hours, has it worsened? No  COVID-19 EXPOSURE  Have you traveled outside the state in the past 14 days? No  Have you been in contact with someone with a confirmed diagnosis of COVID-19 or PUI in the past 14 days without wearing appropriate PPE? No  Have you been living in the same home as a person with confirmed diagnosis of COVID-19 or a PUI (household contact)? No  Have you been diagnosed with COVID-19? No

## 2020-01-21 NOTE — [facility] Group Notes (Signed)
  [facility] LCSW Group Therapy Note  Date/Time:  01/21/2020 11:15AM-12:00PM  Type of Therapy and Topic:  Group Therapy:  Feelings About Hospitalization  Participation Level:  Minimal   Description of Group This process group involved patients discussing their feelings related to being hospitalized, as well as the benefits they see to being in the hospital.  These feelings and benefits were itemized.  The group then brainstormed specific ways in which they could seek those same benefits when they discharge and return home.  Therapeutic Goals 1. Patient will identify and describe positive and negative feelings related to hospitalization 2. Patient will verbalize benefits of hospitalization to themselves personally 3. Patients will brainstorm together ways they can obtain similar benefits in the outpatient setting, identify barriers to wellness and possible solutions  Summary of Patient Progress:  The patient actively engaged in introductory check-in, sharing of feeling "good". Pt expressed his primary feelings about being hospitalized are that he is being "wrongfully imprisoned for 34 years in a row." Pt required redirecting to topic of discussion. Pt identified positives of being hospitalized to be "support from staff, and likewise people on the unit". Pt continued to exhibit tangential, slurred speech and disorganized thoughts. Pt reported that the medication he is being given is not his medication and how he only experienced hallucinations after he started receiving medication 20 years ago. Pt reported of not agreeing with his diagnosis of Schizophrenia and that he could not have received such diagnosis without a medical clearance. Pt proved to focus on religious topics surrounding muslim religions and "Black nationalists". Pt required multiple redirections throughout duration of group.    Therapeutic Modalities Cognitive Behavioral Therapy Motivational Interviewing    Katherina Mires 01/21/2020  5:10 PM

## 2020-01-22 MED ORDER — RISPERIDONE 3 MG PO TABS
3.0000 mg | ORAL_TABLET | Freq: Two times a day (BID) | ORAL | Status: DC
  Administered 2020-01-22 – 2020-01-23 (×3): 3 mg via ORAL
  Filled 2020-01-22 (×6): qty 1

## 2020-01-22 MED ORDER — DOXEPIN HCL 25 MG PO CAPS
25.0000 mg | ORAL_CAPSULE | Freq: Every evening | ORAL | Status: DC | PRN
  Administered 2020-01-22 – 2020-01-30 (×7): 25 mg via ORAL
  Filled 2020-01-22 (×7): qty 1

## 2020-01-22 MED ORDER — ALPRAZOLAM 0.5 MG PO TABS
1.0000 mg | ORAL_TABLET | Freq: Three times a day (TID) | ORAL | Status: DC | PRN
  Administered 2020-01-23 – 2020-01-24 (×3): 1 mg via ORAL
  Filled 2020-01-22 (×4): qty 2

## 2020-01-22 MED ORDER — GABAPENTIN 300 MG PO CAPS
300.0000 mg | ORAL_CAPSULE | Freq: Three times a day (TID) | ORAL | Status: DC
  Administered 2020-01-22 – 2020-01-26 (×10): 300 mg via ORAL
  Filled 2020-01-22 (×17): qty 1

## 2020-01-22 NOTE — Progress Notes (Signed)
[facility] MD Progress Note  01/22/2020 9:41 AM [name]  MRN:  [id number] Subjective: Patient is a 49 year old male with a past psychiatric history significant for schizophrenia who was admitted on 5/10 after worsening aggressive behavior, contemplating suicide, and threatening his family.  His father had reported that he flushed his medications several times prior to admission.  Objective: Patient is seen and examined.  Patient is a 49 year old male with the above-stated past psychiatric history who is seen in follow-up.  Nursing reflects that he has been more irritable this morning.  He has used the N word as well as some profanities.  He stated that he was not hearing voices, and that the fact that he said he would kill people that "I am just all mouth".  He is currently on Xanax 1 mg p.o. 3 times daily, hydrocodone 1 tab 3 times daily as needed, Risperdal 2 mg p.o. twice daily review of the electronic medical record revealed a list of medicines from 11/15/2019 but the patient was on a combination of Prolixin, Seroquel, Zyprexa.  He had apparently agreed to take Risperdal on 5/14.  It does appear that he had had the long-acting Invega injection sometime in the past.  The last psychiatric hospitalization I was able to find in the electronic medical record was at [facility] in 2020.  His discharge medications at that time included Seroquel, Zyprexa Xanax and hydrocodone.  His last family medicine visit on 11/15/2019 revealed Prolixin and Seroquel.  His vital signs are stable, he is afebrile.  He only slept 3.25 hours last night.  Review of his admission laboratories showed essentially normal electrolytes, normal lipids, normal CBC, his admission acetaminophen was 27.  Prolactin was 16.  His hemoglobin A1c was 5.6.  TSH was normal.  Drug screen on admission was positive for benzodiazepines and marijuana but no opiates.  His EKG showed an incomplete right bundle branch with a normal QTc interval.  Principal  Problem: Schizophrenia, paranoid type ([place]) Diagnosis: Principal Problem:   Schizophrenia, paranoid type ([place])  Total Time spent with patient: 20 minutes  Past Psychiatric History: See admission H&P  Past Medical History:  Past Medical History:  Diagnosis Date  . Asthma   . CHF (congestive heart failure) ([place])   . Chronic back pain   . COPD (chronic obstructive pulmonary disease) ([place])   . Knee pain, chronic   . Migraines   . Schizophrenia, schizo-affective ([place])     Past Surgical History:  Procedure Laterality Date  . KNEE SURGERY     four times  . WISDOM TOOTH EXTRACTION     Family History:  Family History  Problem Relation Age of Onset  . Hypertension Father    Family Psychiatric  History: See admission H&P Social History:  Social History   Substance and Sexual Activity  Alcohol Use No   Comment: occ     Social History   Substance and Sexual Activity  Drug Use No   Comment: Pt denied    Social History   Socioeconomic History  . Marital status: Widowed    Spouse name: Not on file  . Number of children: Not on file  . Years of education: Not on file  . Highest education level: Not on file  Occupational History  . Occupation: on disability  Tobacco Use  . Smoking status: Current Every Day Smoker    Packs/day: 1.00    Years: 33.00    Pack years: 33.00    Types: Cigarettes  .  Smokeless tobacco: Never Used  Substance and Sexual Activity  . Alcohol use: No    Comment: occ  . Drug use: No    Comment: Pt denied  . Sexual activity: Not Currently  Other Topics Concern  . Not on file  Social History Narrative   Pt is a widower.  He lives alone, and he receives SSI Disability.  Pt was followed by Dr. Darleene Cleaver, but he has not seen Dr. Darleene Cleaver in several months.   Social Determinants of Health   Financial Resource Strain:   . Difficulty of Paying Living Expenses:   Food Insecurity:   . Worried About Charity fundraiser in the Last Year:   . Youth worker in the Last Year:   Transportation Needs:   . Film/video editor (Medical):   Marland Kitchen Lack of Transportation (Non-Medical):   Physical Activity:   . Days of Exercise per Week:   . Minutes of Exercise per Session:   Stress:   . Feeling of Stress :   Social Connections:   . Frequency of Communication with Friends and Family:   . Frequency of Social Gatherings with Friends and Family:   . Attends Religious Services:   . Active Member of Clubs or Organizations:   . Attends Archivist Meetings:   Marland Kitchen Marital Status:    Additional Social History:                         Sleep: Poor  Appetite:  Fair  Current Medications: Current Facility-Administered Medications  Medication Dose Route Frequency Provider Last Rate Last Admin  . albuterol (VENTOLIN HFA) 108 (90 Base) MCG/ACT inhaler 2 puff  2 puff Inhalation Q4H PRN Mordecai Maes, NP      . ALPRAZolam [name]) tablet 1 mg  1 mg Oral TID Money, Lowry Ram, FNP   1 mg at 01/22/20 0746  . diphenhydrAMINE (BENADRYL) capsule 50 mg  50 mg Oral Q6H PRN Money, Darnelle Maffucci B, FNP   50 mg at 01/20/20 1709   Or  . diphenhydrAMINE (BENADRYL) injection 50 mg  50 mg Intramuscular Q6H PRN Money, Lowry Ram, FNP      . HYDROcodone-acetaminophen (NORCO/VICODIN) 5-325 MG per tablet 1 tablet  1 tablet Oral TID PRN Cobos, Myer Peer, MD   1 tablet at 01/22/20 0616  . lidocaine (LIDODERM) 5 % 1 patch  1 patch Transdermal Q24H Cobos, Myer Peer, MD   1 patch at 01/21/20 1221  . LORazepam (ATIVAN) tablet 2 mg  2 mg Oral Q6H PRN Money, Lowry Ram, FNP   2 mg at 01/21/20 2134   Or  . LORazepam (ATIVAN) injection 2 mg  2 mg Intramuscular Q6H PRN Money, Lowry Ram, FNP   2 mg at 01/18/20 1535  . magnesium hydroxide (MILK OF MAGNESIA) suspension 30 mL  30 mL Oral Daily PRN Money, Darnelle Maffucci B, FNP   30 mL at 01/21/20 1413  . nicotine polacrilex (NICORETTE) gum 2 mg  2 mg Oral PRN Money, Lowry Ram, FNP   2 mg at 01/22/20 0751  . paliperidone (INVEGA  SUSTENNA) injection 234 mg  234 mg Intramuscular Q28 days Money, Lowry Ram, [place]   234 mg at 01/19/20 0910  . risperiDONE (RISPERDAL) tablet 2 mg  2 mg Oral BID Money, Lowry Ram, FNP   2 mg at 01/22/20 0746  . ziprasidone (GEODON) injection 20 mg  20 mg Intramuscular Q12H PRN Money, Lowry Ram, FNP   20  mg at 01/20/20 0315    Lab Results: No results found for this or any previous visit (from the past 48 hour(s)).  Blood Alcohol level:  Lab Results  Component Value Date   ETH <10 01/15/2020   ETH <10 [id number]    Metabolic Disorder Labs: Lab Results  Component Value Date   HGBA1C 5.6 01/17/2020   MPG 114.02 01/17/2020   MPG 111.15 06/25/2017   Lab Results  Component Value Date   PROLACTIN 16.1 (H) 01/17/2020   PROLACTIN 42.9 (H) 08/19/2017   Lab Results  Component Value Date   CHOL 154 01/17/2020   TRIG 186 (H) 01/17/2020   HDL 33 (L) 01/17/2020   CHOLHDL 4.7 01/17/2020   VLDL 37 01/17/2020   LDLCALC 84 01/17/2020   LDLCALC 126 (H) 06/25/2017    Physical Findings: AIMS:  , ,  ,  ,    CIWA:    COWS:     Musculoskeletal: Strength & Muscle Tone: within normal limits Gait & Station: unsteady Patient leans: N/A  Psychiatric Specialty Exam: Physical Exam  Nursing note and vitals reviewed. Constitutional: He appears well-developed and well-nourished.  HENT:  Head: Normocephalic and atraumatic.  Respiratory: Effort normal.  Neurological: He is alert.    Review of Systems  Blood pressure 118/80, pulse (!) 104, temperature 98.6 F (37 C), temperature source Oral, resp. rate 18, height 6\' 1"  (1.854 m), weight 85.7 kg, SpO2 99 %.Body mass index is 24.94 kg/m.  General Appearance: Disheveled  Eye Contact:  Minimal  Speech:  Normal Rate  Volume:  Increased  Mood:  Dysphoric and Irritable  Affect:  Labile  Thought Process:  Goal Directed and Descriptions of Associations: Loose  Orientation:  Full (Time, Place, and Person)  Thought Content:  Delusions and  Hallucinations: Auditory  Suicidal Thoughts:  No  Homicidal Thoughts:  No  Memory:  Immediate;   Poor Recent;   Poor Remote;   Poor  Judgement:  Impaired  Insight:  Lacking  Psychomotor Activity:  Increased  Concentration:  Concentration: Fair and Attention Span: Fair  Recall:  [employer] of Knowledge:  Fair  Language:  Fair  Akathisia:  Negative  Handed:  Right  AIMS (if indicated):     Assets:  Desire for Improvement Resilience  ADL's:  Impaired  Cognition:  Impaired,  Mild  Sleep:  Number of Hours: 3.25     Treatment Plan Summary: Daily contact with patient to assess and evaluate symptoms and progress in treatment, Medication management and Plan : Patient is seen and examined.  Patient is a 49 year old male with the above-stated past psychiatric history who is seen in follow-up.   Diagnosis: #1 schizophrenia, #2 migraine headaches, #3 anxiety, #4 hyperlipidemia  Patient is seen in follow-up.  He remains quite irritable.  He denied auditory hallucinations but was seen responding to internal stimuli yesterday.  He is not sleeping.  He is irritable.  He did receive the long-acting Invega injection on 5/13.  I am going to increase his Risperdal to 3 mg p.o. twice daily.  He refuses anything for mood stability.  I am going to add Neurontin 300 mg p.o. 3 times daily for his chronic pain issues as well as some degree of mood stability and anxiety.  Hopefully he will begin to sleep somewhat better and his irritability will decrease.  1.  Continue Xanax 1 mg p.o. 3 times daily for anxiety. 2.  Continue hydrocodone 1 tablet p.o. 3 times daily as needed pain. 3.  Patient received the paliperidone long-acting injection 234 mg on 5/13.  This was for schizophrenia. 4.  Increase Risperdal to 3 mg p.o. twice daily. 5.  Add Neurontin 300 mg p.o. 3 times daily for anxiety, irritability, chronic pain and mood stability. 6.  Continue ziprasidone 20 mg IM every 12 hours as needed agitation.  Sharma Covert, MD 01/22/2020, 9:41 AM

## 2020-01-22 NOTE — [facility] Group Notes (Signed)
[facility] LCSW Group Therapy Note  Date/Time:  01/22/2020 11:15-12:00PM  Type of Therapy and Topic:  Group Therapy:  Healthy and Unhealthy Supports  Participation Level:  Active   Description of Group:  Patients in this group were introduced to the idea of adding a variety of healthy supports to address the various needs in their lives.Patients discussed what additional healthy supports could be helpful in their recovery and wellness after discharge in order to prevent future hospitalizations.   An emphasis was placed on using counselor, doctor, therapy groups, 12-step groups, and problem-specific support groups to expand supports.  They also worked as a group on developing a specific plan for several patients to deal with unhealthy supports through [place], psychoeducation with loved ones, and even termination of relationships.   Therapeutic Goals:   1)  discuss importance of adding supports to stay well once out of the hospital  2)  compare healthy versus unhealthy supports and identify some examples of each  3)  generate ideas and descriptions of healthy supports that can be added  4)  offer mutual support about how to address unhealthy supports  5)  encourage active participation in and adherence to discharge plan    Summary of Patient Progress:  The patient joined group following group initial check-ins, however shared of feeling "8/10" when asked to provide supporting details, pt proved unable to share as to why he rated an 8. Pt became highly irritated when learning of the topic of discussion, attempting to sabotage group in relaying how unsupportive his family is, specifically his mother and son, and stated that "My support system shit on me", further exhibiting tangential speech and scattered, unrelated thought content. Pt continued to express anger and aggression towards family and stated that he has "No support, no life, no lifeline" and wished to obliterate the world.  Pt later identified supports in his life to be "God, my mom, nature", further detailing of being outside to be of support to him. Pt identified supports received in the hospital from "food, being nourished properly, and Dr.'s provide support when they know what they're doing". Pt required multiple redirections throughout duration of group. Pt proved receptive to CSW feedback.   Therapeutic Modalities:   Motivational Interviewing Brief Solution-Focused Therapy  Katherina Mires 01/22/2020  3:28 PM

## 2020-01-22 NOTE — Progress Notes (Signed)
DAR NOTE: Patient presents with anxious affect and irritable  mood.  Denies suicidal thoughts.  Threatening to blow up buildings and dropping bomb on people.  Using the "N" word and banging the phone.  Patient was on the phone calling the [facility] police department.  Ativan 2 mg and Benadryl 50 mg given for agitation with fair effect.  Rates depression at 0, hopelessness at 0, and anxiety at 5".  Maintained on routine safety checks.  Medications given as prescribed.  Support and encouragement offered as needed.  Attended group and participated.  States goal for today is "exercising self control."  Patient visible in milieu talking to himself and rambling.  Patient is safe on and off the unit.

## 2020-01-22 NOTE — Progress Notes (Signed)
   01/22/20 2230  COVID-19 Daily Checkoff  Have you had a fever (temp > 37.80C/100F)  in the past 24 hours?  No  If you have had runny nose, nasal congestion, sneezing in the past 24 hours, has it worsened? No  COVID-19 EXPOSURE  Have you traveled outside the state in the past 14 days? No  Have you been in contact with someone with a confirmed diagnosis of COVID-19 or PUI in the past 14 days without wearing appropriate PPE? No  Have you been living in the same home as a person with confirmed diagnosis of COVID-19 or a PUI (household contact)? No  Have you been diagnosed with COVID-19? No

## 2020-01-22 NOTE — Progress Notes (Signed)
   01/22/20 2230  Psych Admission Type (Psych Patients Only)  Admission Status Involuntary  Psychosocial Assessment  Patient Complaints Anxiety  Eye Contact Brief  Facial Expression Anxious  Affect Anxious  Speech Pressured;Logical/coherent  Interaction Intrusive  Motor Activity Slow  Appearance/Hygiene Unremarkable  Behavior Characteristics Anxious;Fidgety;Wandering risk  Mood Preoccupied;Labile  Thought Process  Coherency WDL  Content Blaming others  Delusions None reported or observed  Perception WDL (pt observed sitting in day room talking to self)  Hallucination None reported or observed (pt talking to self as if responding to internal stimuli)  Judgment Impaired  Confusion None  Danger to Self  Current suicidal ideation? Denies  Self-Injurious Behavior No self-injurious ideation or behavior indicators observed or expressed   Agreement Not to Harm Self Yes  Description of Agreement Verbally contracts for safety  Danger to Others  Danger to Others None reported or observed

## 2020-01-23 MED ORDER — PALIPERIDONE PALMITATE ER 156 MG/ML IM SUSY
156.0000 mg | PREFILLED_SYRINGE | Freq: Once | INTRAMUSCULAR | Status: AC
  Administered 2020-01-23: 156 mg via INTRAMUSCULAR
  Filled 2020-01-23: qty 1

## 2020-01-23 NOTE — Progress Notes (Signed)
Adult Psychoeducational Group Note  Date:  01/23/2020 Time:  8:58 PM  Group Topic/Focus:  Wrap-Up Group:   The focus of this group is to help patients review their daily goal of treatment and discuss progress on daily workbooks.  Participation Level:  Active  Participation Quality:  Intrusive, Redirectable and Resistant  Affect:  Irritable, Labile and Resistant  Cognitive:  Oriented and Lacking  Insight: Limited  Engagement in Group:  Improving  Modes of Intervention:  Discussion, Education, Limit-setting and Support  Additional Comments:  Patient attended and participated in group tonight. He reports having a good day, however, he did have a situation wherein he had a panic attack. His nurse did not give him his medication,therefore, he called her out of her name. Today he did learn some other coping skills to help his manage himself when he has a panic attack. He stated he should lower his voice,walk away, breath in a brown bag and try not to curse people out.  Today her also went outside to play basketball. Some positive things that happened today was that he meet a friend, talked to people and enjoyed going outside.  Salley Scarlet [facility] 01/23/2020, 8:58 PM

## 2020-01-23 NOTE — Progress Notes (Signed)
SPIRITUALITY GROUP NOTE  Spirituality group facilitated by Simone Curia, MDiv, [place].  Group Description:  Group focused on topic of hope.  Patients participated in facilitated discussion around topic, connecting with one another around experiences and definitions for hope.  Group members engaged with visual explorer photos, reflecting on what hope looks like for them today.  Group engaged in discussion around how their definitions of hope are present today in hospital.   Modalities: Psycho-social ed, Adlerian, Narrative, MI Patient Progress  [name] was present throughout group.  He began speaking about his faith and became intrusive - speaking over other patients and difficult to redirect.  Facilitator encouraged [name] toward connecting with sources of hope and support.  [name] was only able to speak abstractly about hope and topics tangential.

## 2020-01-23 NOTE — Progress Notes (Addendum)
[facility] [address] MD Progress Note  01/23/2020 10:31 AM [name]  MRN:  [id number]   Subjective: Follow-up for this 49 year old male diagnosed with schizophrenia.  Patient reports today that he is doing fine except he is trying to get in touch with the bank but he does not tell me why.  He continues stating that he needs to get his phone so that he can change some cards in it.  He is informed that.  Patient then goes to the nurses station demanding to have his anxiety medication.  Patient is getting loud and is asked to calm down and to discuss and to use his coping skills.  Patient continues to get louder.  He then is informed that he is getting his Kirt Boys 156 mg IM injection today and he states "okay that sounds good."  Patient denies any suicidal homicidal ideations today and denies any hallucinations.  Patient reports that reason he did not sleep very good last night was because of the medications he got yesterday he slept too much yesterday during the day.  Principal Problem: Schizophrenia, paranoid type ([place]) Diagnosis: Principal Problem:   Schizophrenia, paranoid type ([place])  Total Time spent with patient: 30 minutes  Past Psychiatric History: Schziophrenia with history of multiple inpatient admissions. Most recent attmept was 11/2018 were he overdosed on his psychotropic medications and spent 10 days at [facility]. Reports over 60 suicide attempts. He has tried and failed multiple antipsychotics as noted on previous admissions.  Past Medical History:  Past Medical History:  Diagnosis Date  . Asthma   . CHF (congestive heart failure) ([place])   . Chronic back pain   . COPD (chronic obstructive pulmonary disease) ([place])   . Knee pain, chronic   . Migraines   . Schizophrenia, schizo-affective ([place])     Past Surgical History:  Procedure Laterality Date  . KNEE SURGERY     four times  . WISDOM TOOTH EXTRACTION     Family History:  Family History  Problem Relation Age of Onset  .  Hypertension Father    Family Psychiatric  History: Denies Social History:  Social History   Substance and Sexual Activity  Alcohol Use No   Comment: occ     Social History   Substance and Sexual Activity  Drug Use No   Comment: Pt denied    Social History   Socioeconomic History  . Marital status: Widowed    Spouse name: Not on file  . Number of children: Not on file  . Years of education: Not on file  . Highest education level: Not on file  Occupational History  . Occupation: on disability  Tobacco Use  . Smoking status: Current Every Day Smoker    Packs/day: 1.00    Years: 33.00    Pack years: 33.00    Types: Cigarettes  . Smokeless tobacco: Never Used  Substance and Sexual Activity  . Alcohol use: No    Comment: occ  . Drug use: No    Comment: Pt denied  . Sexual activity: Not Currently  Other Topics Concern  . Not on file  Social History Narrative   Pt is a widower.  He lives alone, and he receives SSI Disability.  Pt was followed by Dr. Darleene Cleaver, but he has not seen Dr. Darleene Cleaver in several months.   Social Determinants of Health   Financial Resource Strain:   . Difficulty of Paying Living Expenses:   Food Insecurity:   . Worried About Estate manager/land agent  of Food in the Last Year:   . [place] in the Last Year:   Transportation Needs:   . Lack of Transportation (Medical):   Marland Kitchen Lack of Transportation (Non-Medical):   Physical Activity:   . Days of Exercise per Week:   . Minutes of Exercise per Session:   Stress:   . Feeling of Stress :   Social Connections:   . Frequency of Communication with Friends and Family:   . Frequency of Social Gatherings with Friends and Family:   . Attends Religious Services:   . Active Member of Clubs or Organizations:   . Attends Archivist Meetings:   Marland Kitchen Marital Status:    Additional Social History:                         Sleep: Fair  Appetite:  Good  Current Medications: Current  Facility-Administered Medications  Medication Dose Route Frequency Provider Last Rate Last Admin  . albuterol (VENTOLIN HFA) 108 (90 Base) MCG/ACT inhaler 2 puff  2 puff Inhalation Q4H PRN Mordecai Maes, NP      . ALPRAZolam [name]) tablet 1 mg  1 mg Oral TID PRN Sharma Covert, MD   1 mg at 01/23/20 1005  . diphenhydrAMINE (BENADRYL) capsule 50 mg  50 mg Oral Q6H PRN Money, Darnelle Maffucci B, FNP   50 mg at 01/22/20 1327   Or  . diphenhydrAMINE (BENADRYL) injection 50 mg  50 mg Intramuscular Q6H PRN Money, Lowry Ram, FNP      . doxepin (SINEQUAN) capsule 25 mg  25 mg Oral QHS PRN Sharma Covert, MD   25 mg at 01/22/20 2145  . gabapentin (NEURONTIN) capsule 300 mg  300 mg Oral TID Sharma Covert, MD   300 mg at 01/22/20 1705  . HYDROcodone-acetaminophen (NORCO/VICODIN) 5-325 MG per tablet 1 tablet  1 tablet Oral TID PRN Bryleigh Ottaway, Myer Peer, MD   1 tablet at 01/23/20 0737  . lidocaine (LIDODERM) 5 % 1 patch  1 patch Transdermal Q24H Phinley Schall, Myer Peer, MD   1 patch at 01/23/20 1006  . LORazepam (ATIVAN) tablet 2 mg  2 mg Oral Q6H PRN Money, Lowry Ram, FNP   2 mg at 01/22/20 2145   Or  . LORazepam (ATIVAN) injection 2 mg  2 mg Intramuscular Q6H PRN Money, Lowry Ram, FNP   2 mg at 01/18/20 1535  . magnesium hydroxide (MILK OF MAGNESIA) suspension 30 mL  30 mL Oral Daily PRN Money, Darnelle Maffucci B, FNP   30 mL at 01/21/20 1413  . nicotine polacrilex (NICORETTE) gum 2 mg  2 mg Oral PRN Money, Lowry Ram, FNP   2 mg at 01/22/20 0751  . paliperidone (INVEGA SUSTENNA) injection 156 mg  156 mg Intramuscular Once Money, Darnelle Maffucci B, FNP      . paliperidone (INVEGA SUSTENNA) injection 234 mg  234 mg Intramuscular Q28 days Money, Lowry Ram, [place]   234 mg at 01/19/20 0910  . risperiDONE (RISPERDAL) tablet 3 mg  3 mg Oral BID Sharma Covert, MD   3 mg at 01/23/20 0737  . ziprasidone (GEODON) injection 20 mg  20 mg Intramuscular Q12H PRN Money, Lowry Ram, FNP   20 mg at 01/23/20 0145    Lab Results: No results found for  this or any previous visit (from the past 48 hour(s)).  Blood Alcohol level:  Lab Results  Component Value Date   ETH <10 01/15/2020   ETH <  10 [id number]    Metabolic Disorder Labs: Lab Results  Component Value Date   HGBA1C 5.6 01/17/2020   MPG 114.02 01/17/2020   MPG 111.15 06/25/2017   Lab Results  Component Value Date   PROLACTIN 16.1 (H) 01/17/2020   PROLACTIN 42.9 (H) 08/19/2017   Lab Results  Component Value Date   CHOL 154 01/17/2020   TRIG 186 (H) 01/17/2020   HDL 33 (L) 01/17/2020   CHOLHDL 4.7 01/17/2020   VLDL 37 01/17/2020   LDLCALC 84 01/17/2020   LDLCALC 126 (H) 06/25/2017    Physical Findings: AIMS:  , ,  ,  ,    CIWA:    COWS:     Musculoskeletal: Strength & Muscle Tone: within normal limits Gait & Station: normal Patient leans: N/A  Psychiatric Specialty Exam: Physical Exam  Nursing note and vitals reviewed. Constitutional: He is oriented to person, place, and time. He appears well-developed and well-nourished.  Respiratory: Effort normal.  Musculoskeletal:        General: Normal range of motion.  Neurological: He is alert and oriented to person, place, and time.  Skin: Skin is warm.    Review of Systems  Constitutional: Negative.   HENT: Negative.   Eyes: Negative.   Respiratory: Negative.   Cardiovascular: Negative.   Gastrointestinal: Negative.   Genitourinary: Negative.   Musculoskeletal: Negative.   Skin: Negative.   Neurological: Negative.   Psychiatric/Behavioral: The patient is nervous/anxious.     Blood pressure 118/80, pulse (!) 104, temperature 98.6 F (37 C), temperature source Oral, resp. rate 18, height 6\' 1"  (1.854 m), weight 85.7 kg, SpO2 99 %.Body mass index is 24.94 kg/m.  General Appearance: Disheveled  Eye Contact:  Fair  Speech:  Clear and Coherent and Normal Rate  Volume:  Increased  Mood:  Anxious and Irritable  Affect:  Labile  Thought Process:  Goal Directed and Descriptions of Associations: Loose   Orientation:  Full (Time, Place, and Person)  Thought Content:  Delusions and Hallucinations: Auditory  Suicidal Thoughts:  No  Homicidal Thoughts:  No  Memory:  Immediate;   Poor Recent;   Poor Remote;   Poor  Judgement:  Impaired  Insight:  Lacking  Psychomotor Activity:  Normal  Concentration:  Concentration: Fair  Recall:  [employer] of Knowledge:  Fair  Language:  Fair  Akathisia:  No  Handed:  Right  AIMS (if indicated):     Assets:  Desire for Improvement Financial Resources/Insurance Housing Resilience  ADL's:  Impaired  Cognition:  Impaired,  Mild  Sleep:  Number of Hours: 4   Assessment: Patient presents in the milieu is been interacting with peers and staff.  His interaction with peers has been pleasant, however his interaction with staff has been more irritable with yelling and demands for medications which normally pertains to his anxiety medication or his pain medication.  He was reported the patient was at the nurses station frequently last night and was given an IM injection after he said to give it to him.  The patient has shown some improvement from when he first presented but continues to be irritable and not easily redirectable.  He continues to request controlled substances.  He has been compliant with his medications thus far though.  No new labs at this time.  Show some minor tachycardia with a heart rate of 104.  Treatment Plan Summary: Daily contact with patient to assess and evaluate symptoms and progress in treatment and Medication management Continue Xanax  1 mg p.o. 3 times daily as needed for anxiety Continue doxepin 25 mg p.o. nightly as needed for insomnia Continue Neurontin 300 mg p.o. 3 times daily for anxiety and agitation Continue Norco 5-325 mg tablet p.o. 3 times daily as needed for moderate pain Continue Invega Sustenna 234 mg IM injection q. 28 days with last dose on 01/19/2020 Order Lorayne Bender Sustenna 156 mg IM injection 1 time dose for  today Continue Risperdal 3 mg p.o. twice daily for schizophrenia Continue Geodon 20 mg IM injection every 12 hours as needed for agitation or psychosis Encourage group therapy participation Continue every 15 minute safety checks  Lewis Shock, FNP 01/23/2020, 10:31 AM   Attest to NP Note

## 2020-01-23 NOTE — Progress Notes (Signed)
Patient has come to the nursing station numerous times asking about how he can get to his phone because he needs to have his service stopped. Writer informed him of the time and asked that we discuss this matter after wake up call. He returned to his room and came back out a few moments asking about his clothes and if they are ready. MHT went to check and notified him that he had 2 bags ahead of his. He was fine with that but a few moments again he asked that his clothes be brought back because he didn't want them washed. He then returned to ask for towels so he could shower. Patient retuned to his room and writer heard 3-4 loud bangs on the wall. When asked what was the banging noise he replied that he didn't know what we were talking about. Writer informed him that other s were sleeping and if this behavior continued he would possibly need Geodon. He replied just give it to me because it won't do a damn thing.

## 2020-01-23 NOTE — Progress Notes (Signed)
   01/23/20 2215  Psych Admission Type (Psych Patients Only)  Admission Status Involuntary  Psychosocial Assessment  Patient Complaints Agitation  Eye Contact Brief  Facial Expression Anxious;Animated  Affect Anxious  Speech Pressured;Logical/coherent;Tangential  Interaction Intrusive;Dominating;Demanding  Motor Activity Slow;Shuffling  Appearance/Hygiene Unremarkable  Behavior Characteristics Agitated;Anxious;Intrusive  Mood Labile  Thought Process  Coherency WDL  Content Blaming others  Delusions None reported or observed  Perception WDL  Hallucination None reported or observed  Judgment Impaired  Confusion None  Danger to Self  Current suicidal ideation? Denies  Danger to Others  Danger to Others None reported or observed   Pt agitated. Pt seen in milieu in dayroom and in group. Monopolized conversation in group. Pt given Geodon 20 mg IM left deltoid. Pt in room yelling. Pt door closed.

## 2020-01-23 NOTE — Progress Notes (Signed)
Pt given Geodon for severe agitation. Geodon 20 mg IM left deltoid. Pt tolerated injection well.

## 2020-01-23 NOTE — Tx Team (Signed)
Interdisciplinary Treatment and Diagnostic Plan Update  01/23/2020 Time of Session: 9:20am [name] MRN: [id number]  Principal Diagnosis: Schizophrenia, paranoid type [facility])  Secondary Diagnoses: Principal Problem:   Schizophrenia, paranoid type ([place])   Current Medications:  Current Facility-Administered Medications  Medication Dose Route Frequency Provider Last Rate Last Admin  . albuterol (VENTOLIN HFA) 108 (90 Base) MCG/ACT inhaler 2 puff  2 puff Inhalation Q4H PRN Mordecai Maes, NP      . ALPRAZolam [name]) tablet 1 mg  1 mg Oral TID PRN Sharma Covert, MD      . diphenhydrAMINE (BENADRYL) capsule 50 mg  50 mg Oral Q6H PRN Money, Lowry Ram, FNP   50 mg at 01/22/20 1327   Or  . diphenhydrAMINE (BENADRYL) injection 50 mg  50 mg Intramuscular Q6H PRN Money, Lowry Ram, FNP      . doxepin (SINEQUAN) capsule 25 mg  25 mg Oral QHS PRN Sharma Covert, MD   25 mg at 01/22/20 2145  . gabapentin (NEURONTIN) capsule 300 mg  300 mg Oral TID Sharma Covert, MD   300 mg at 01/22/20 1705  . HYDROcodone-acetaminophen (NORCO/VICODIN) 5-325 MG per tablet 1 tablet  1 tablet Oral TID PRN Cobos, Myer Peer, MD   1 tablet at 01/23/20 0737  . lidocaine (LIDODERM) 5 % 1 patch  1 patch Transdermal Q24H Cobos, Myer Peer, MD   1 patch at 01/21/20 1221  . LORazepam (ATIVAN) tablet 2 mg  2 mg Oral Q6H PRN Money, Lowry Ram, FNP   2 mg at 01/22/20 2145   Or  . LORazepam (ATIVAN) injection 2 mg  2 mg Intramuscular Q6H PRN Money, Lowry Ram, FNP   2 mg at 01/18/20 1535  . magnesium hydroxide (MILK OF MAGNESIA) suspension 30 mL  30 mL Oral Daily PRN Money, Darnelle Maffucci B, FNP   30 mL at 01/21/20 1413  . nicotine polacrilex (NICORETTE) gum 2 mg  2 mg Oral PRN Money, Lowry Ram, FNP   2 mg at 01/22/20 0751  . paliperidone (INVEGA SUSTENNA) injection 156 mg  156 mg Intramuscular Once Money, Darnelle Maffucci B, FNP      . paliperidone (INVEGA SUSTENNA) injection 234 mg  234 mg Intramuscular Q28 days Money, Lowry Ram, [place]   234  mg at 01/19/20 0910  . risperiDONE (RISPERDAL) tablet 3 mg  3 mg Oral BID Sharma Covert, MD   3 mg at 01/23/20 0737  . ziprasidone (GEODON) injection 20 mg  20 mg Intramuscular Q12H PRN Money, Lowry Ram, FNP   20 mg at 01/23/20 0145   PTA Medications: Medications Prior to Admission  Medication Sig Dispense Refill Last Dose  . acetaminophen (TYLENOL) 500 MG tablet Take 1,000 mg by mouth every 6 (six) hours as needed for moderate pain.   Past Month at Unknown time  . albuterol (VENTOLIN HFA) 108 (90 Base) MCG/ACT inhaler Inhale 2 puffs into the lungs every 4 (four) hours as needed for wheezing or shortness of breath. 18 g 1 Past Month at Unknown time  . ALPRAZolam (XANAX) 1 MG tablet Take 1 mg by mouth 3 (three) times daily.    Past Month at Unknown time  . diclofenac sodium (VOLTAREN) 1 % GEL Apply 4 g topically 4 (four) times daily as needed. 500 g 6 Past Month at Unknown time  . HYDROcodone-acetaminophen (NORCO) 10-325 MG tablet Take 1-2 tablets by mouth every 6 (six) hours as needed for moderate pain.   Past Month at Unknown time  .  mupirocin ointment (BACTROBAN) 2 % Place 1 application into the nose 2 (two) times daily. 22 g 3 Past Month at Unknown time  . OLANZapine (ZYPREXA) 10 MG tablet Take 40 mg by mouth at bedtime.    Past Month at Unknown time  . perphenazine (TRILAFON) 4 MG tablet Take 1 tablet (4 mg total) by mouth 2 (two) times daily.   Past Month at Unknown time  . potassium chloride SA (KLOR-CON) 20 MEQ tablet Take 1 tablet (20 mEq total) by mouth 2 (two) times daily. 20 tablet 0 Past Month at Unknown time  . QUEtiapine (SEROQUEL) 25 MG tablet Take 75 mg by mouth daily.   Past Month at Unknown time  . acetaminophen (TYLENOL) 325 MG tablet Take 2 tablets (650 mg total) by mouth every 6 (six) hours as needed for mild pain, fever or headache. (Patient not taking: Reported on 01/17/2020)   Completed Course at Unknown time  . azithromycin (ZITHROMAX) 250 MG tablet Take 1 tablet (250 mg  total) by mouth daily. Take first 2 tablets together, then 1 every day until finished. (Patient not taking: Reported on 01/16/2020) 6 tablet 0 Completed Course at Unknown time  . fluPHENAZine decanoate (PROLIXIN) 25 MG/ML injection Inject into the muscle.       Patient Stressors: Financial difficulties Health problems Legal issue Medication change or noncompliance Substance abuse  Patient Strengths: Supportive family/friends  Treatment Modalities: Medication Management, Group therapy, Case management,  1 to 1 session with clinician, Psychoeducation, Recreational therapy.   Physician Treatment Plan for Primary Diagnosis: Schizophrenia, paranoid type ([place]) Long Term Goal(s): Improvement in symptoms so as ready for discharge Improvement in symptoms so as ready for discharge   Short Term Goals: Ability to identify changes in lifestyle to reduce recurrence of condition will improve Ability to identify and develop effective coping behaviors will improve Ability to demonstrate self-control will improve Ability to identify triggers associated with substance abuse/mental health issues will improve  Medication Management: Evaluate patient's response, side effects, and tolerance of medication regimen.  Therapeutic Interventions: 1 to 1 sessions, Unit Group sessions and Medication administration.  Evaluation of Outcomes: Progressing  Physician Treatment Plan for Secondary Diagnosis: Principal Problem:   Schizophrenia, paranoid type ([place])  Long Term Goal(s): Improvement in symptoms so as ready for discharge Improvement in symptoms so as ready for discharge   Short Term Goals: Ability to identify changes in lifestyle to reduce recurrence of condition will improve Ability to identify and develop effective coping behaviors will improve Ability to demonstrate self-control will improve Ability to identify triggers associated with substance abuse/mental health issues will improve     Medication  Management: Evaluate patient's response, side effects, and tolerance of medication regimen.  Therapeutic Interventions: 1 to 1 sessions, Unit Group sessions and Medication administration.  Evaluation of Outcomes: Progressing   RN Treatment Plan for Primary Diagnosis: Schizophrenia, paranoid type ([place]) Long Term Goal(s): Knowledge of disease and therapeutic regimen to maintain health will improve  Short Term Goals: Ability to verbalize frustration and anger appropriately will improve, Ability to identify and develop effective coping behaviors will improve and Compliance with prescribed medications will improve  Medication Management: RN will administer medications as ordered by provider, will assess and evaluate patient's response and provide education to patient for prescribed medication. RN will report any adverse and/or side effects to prescribing provider.  Therapeutic Interventions: 1 on 1 counseling sessions, Psychoeducation, Medication administration, Evaluate responses to treatment, Monitor vital signs and CBGs as ordered, Perform/monitor CIWA, COWS, AIMS  and Fall Risk screenings as ordered, Perform wound care treatments as ordered.  Evaluation of Outcomes: Progressing   LCSW Treatment Plan for Primary Diagnosis: Schizophrenia, paranoid type ([place]) Long Term Goal(s): Safe transition to appropriate next level of care at discharge, Engage patient in therapeutic group addressing interpersonal concerns.  Short Term Goals: Engage patient in aftercare planning with referrals and resources, Increase emotional regulation and Increase skills for wellness and recovery  Therapeutic Interventions: Assess for all discharge needs, 1 to 1 time with Social worker, Explore available resources and support systems, Assess for adequacy in community support network, Educate family and significant other(s) on suicide prevention, Complete Psychosocial Assessment, Interpersonal group therapy.  Evaluation of  Outcomes: Progressing  Progress in Treatment: Attending groups: Yes. Participating in groups: Yes. Taking medication as prescribed: Yes. Med seeking.  Toleration medication: Yes. Family/Significant other contact made: No, will contact:  declined collateral consents. Patient understands diagnosis: Yes. Discussing patient identified problems/goals with staff: Yes. Medical problems stabilized or resolved: Yes. Denies suicidal/homicidal ideation: No. Issues/concerns per patient self-inventory: No.  New problem(s) identified: Yes, Describe:  limited social supports, medication non-compliance.  New Short Term/Long Term Goal(s): medication management for mood stabilization; elimination of SI thoughts; development of comprehensive mental wellness/sobriety plan.  Patient Goals:  "Discharge."  Discharge Plan or Barriers: Patient has his own apartment in [place]. Is established with [facility], but declines their services at this time. CSW assessing for appropriate referrals.   Reason for Continuation of Hospitalization: Aggression Medication stabilization  Estimated Length of Stay: 3-5 days Attendees: Patient:  01/23/2020   Physician:  01/23/2020   Nursing:  01/23/2020   RN Care Manager: 01/23/2020   Social Worker: Darletta Moll, [place] 01/23/2020   Recreational Therapist:  01/23/2020   Other: Marvia Pickles, NP 01/23/2020   Other:  01/23/2020   Other: 01/23/2020     Scribe for Treatment Team: Vassie Moselle, LCSW 01/23/2020 9:52 AM

## 2020-01-23 NOTE — Progress Notes (Signed)
   01/23/20 1400  Psych Admission Type (Psych Patients Only)  Admission Status Involuntary  Psychosocial Assessment  Patient Complaints Agitation  Eye Contact Brief  Facial Expression Anxious  Affect Anxious  Speech Pressured;Logical/coherent  Interaction Intrusive  Motor Activity Slow  Appearance/Hygiene Unremarkable  Behavior Characteristics Agitated  Mood Labile  Thought Process  Coherency WDL  Content Blaming others  Delusions None reported or observed  Perception WDL (pt observed sitting in day room talking to self)  Hallucination None reported or observed (pt talking to self as if responding to internal stimuli)  Judgment Impaired  Confusion None  Danger to Self  Current suicidal ideation? Denies  Self-Injurious Behavior No self-injurious ideation or behavior indicators observed or expressed   Agreement Not to Harm Self Yes  Description of Agreement Verbally contracts for safety  Danger to Others  Danger to Others None reported or observed

## 2020-01-23 NOTE — Progress Notes (Signed)
Recreation Therapy Notes  Date: 5.17.21 Time: 1000 Location: [address] Dayroom  Group Topic: Coping Skills  Goal Area(s) Addresses:  Patients will identify positive coping strategies. Patients will identify benefit of using positive coping strategies over negative coping strategies.  Behavioral Response: Engaged  Intervention: Worksheet, pencils  Activity: Healthy vs. Unhealthy Coping Strategies.  Patients were to identify a current problem they are facing, identify the unhealthy coping strategies they have used to address this problem and consequences of those coping strategies.  Patients then identified healthy coping strategies, benefits and barriers to using these strategies.  Education: Radiographer, therapeutic, Dentist.   Education Outcome: Acknowledges understanding/In group clarification offered/Needs additional education.   Clinical Observations/Feedback: Pt identified current problem as "being wrongfully detained".  Pt stated unhealthy coping strategies were raising hell and threatening.  Pt explained consequences as people not knowing what threats he faces so they don't know why he's threatening.  Pt identified healthy coping strategies as deep breathing, breathing into a paper bag and using calm tones when talking to people.  Pt expressed the outcomes were discussing what is happening and writing it down if needed.  Pt explained "panic and being treated worse than a dog" are barriers to using healthy coping strategies.     Victorino Sparrow, LRT/CTRS    Victorino Sparrow A 01/23/2020 11:23 AM

## 2020-01-23 NOTE — Progress Notes (Addendum)
Pt in dayroom yelling and cursing in front of other pts. Pt took shirt off. Pt asked to put his shirt back on. Pt left dayroom and went to his room. Yelling about the bathroom, "People use my toilet but don't flush it."

## 2020-01-23 NOTE — Progress Notes (Signed)
Patient received Geodon 20 mg IM in right deltoid. Encouraged to lay down and get some rest. Writer feels that the ativan patient recieved is causing patient to become more anxious and agitated than helping him to calm down.

## 2020-01-24 MED ORDER — OLANZAPINE 5 MG PO TBDP: 5.0000 mg | ORAL_TABLET | Freq: Every day | ORAL | Status: DC

## 2020-01-24 MED ORDER — RISPERIDONE 3 MG PO TABS: 3.0000 mg | ORAL_TABLET | Freq: Every day | ORAL | Status: DC

## 2020-01-24 MED ORDER — WHITE PETROLATUM EX OINT
TOPICAL_OINTMENT | CUTANEOUS | Status: AC
  Administered 2020-01-24: 1
  Filled 2020-01-24: qty 5

## 2020-01-24 MED ORDER — RISPERIDONE 2 MG PO TBDP
4.0000 mg | ORAL_TABLET | Freq: Two times a day (BID) | ORAL | Status: DC
  Administered 2020-01-24: 4 mg via ORAL
  Filled 2020-01-24 (×2): qty 2

## 2020-01-24 MED ORDER — OLANZAPINE 5 MG PO TBDP
5.0000 mg | ORAL_TABLET | Freq: Every day | ORAL | Status: DC
  Administered 2020-01-24: 5 mg via ORAL
  Filled 2020-01-24 (×4): qty 1

## 2020-01-24 MED ORDER — RISPERIDONE 2 MG PO TBDP
4.0000 mg | ORAL_TABLET | Freq: Every day | ORAL | Status: DC
  Filled 2020-01-24 (×2): qty 2

## 2020-01-24 MED ORDER — HYDROCODONE-ACETAMINOPHEN 5-325 MG PO TABS
1.0000 | ORAL_TABLET | Freq: Three times a day (TID) | ORAL | Status: DC | PRN
  Administered 2020-01-24 – 2020-01-25 (×2): 1 via ORAL
  Filled 2020-01-24 (×2): qty 1

## 2020-01-24 MED ORDER — ALPRAZOLAM 0.5 MG PO TABS
1.0000 mg | ORAL_TABLET | Freq: Two times a day (BID) | ORAL | Status: DC | PRN
  Administered 2020-01-24 – 2020-01-25 (×2): 1 mg via ORAL
  Filled 2020-01-24 (×2): qty 2

## 2020-01-24 NOTE — Progress Notes (Signed)
[facility] MD Progress Note  01/24/2020 12:15 PM [name]  MRN:  [id number] Subjective:  Patient is a 49 year old male with a past psychiatric history significant for schizophrenia who was admitted on 5/10 after worsening aggressive behavior, contemplating suicide, and threatening his family.  His father had reported that he flushed his medications several times prior to admission.  Objective: Patient is seen and examined.  Patient is a 49 year old male with the above-stated past psychiatric history who is seen in follow-up.  He is essentially unchanged from when I last saw him on 01/22/2020.  He continues to be intrusive, using racial slurs in front of African-American staff, and still quite delusional.  He is also pressured and tangential.  He received the paliperidone long-acting injection of 156 mg on 01/23/2020.  He remains on oral Risperdal 3 mg p.o. nightly.  In our discussion today given his failure of multiple other medications I required whether or not he had been on Clozaril.  He stated that he had been, and that Clozaril "causes AIDS".  He refused to consider going on to Clozaril.  His blood pressure stable, he is mildly tachycardic with a rate of 102.  He slept 6.25 hours last night.  No new laboratories.  Principal Problem: Schizophrenia, paranoid type ([place]) Diagnosis: Principal Problem:   Schizophrenia, paranoid type ([place])  Total Time spent with patient: 20 minutes  Past Psychiatric History: See admission H&P  Past Medical History:  Past Medical History:  Diagnosis Date  . Asthma   . CHF (congestive heart failure) ([place])   . Chronic back pain   . COPD (chronic obstructive pulmonary disease) ([place])   . Knee pain, chronic   . Migraines   . Schizophrenia, schizo-affective ([place])     Past Surgical History:  Procedure Laterality Date  . KNEE SURGERY     four times  . WISDOM TOOTH EXTRACTION     Family History:  Family History  Problem Relation Age of Onset  . Hypertension Father     Family Psychiatric  History: See admission H&P Social History:  Social History   Substance and Sexual Activity  Alcohol Use No   Comment: occ     Social History   Substance and Sexual Activity  Drug Use No   Comment: Pt denied    Social History   Socioeconomic History  . Marital status: Widowed    Spouse name: Not on file  . Number of children: Not on file  . Years of education: Not on file  . Highest education level: Not on file  Occupational History  . Occupation: on disability  Tobacco Use  . Smoking status: Current Every Day Smoker    Packs/day: 1.00    Years: 33.00    Pack years: 33.00    Types: Cigarettes  . Smokeless tobacco: Never Used  Substance and Sexual Activity  . Alcohol use: No    Comment: occ  . Drug use: No    Comment: Pt denied  . Sexual activity: Not Currently  Other Topics Concern  . Not on file  Social History Narrative   Pt is a widower.  He lives alone, and he receives SSI Disability.  Pt was followed by Dr. Darleene Cleaver, but he has not seen Dr. Darleene Cleaver in several months.   Social Determinants of Health   Financial Resource Strain:   . Difficulty of Paying Living Expenses:   Food Insecurity:   . Worried About Charity fundraiser in the Last Year:   .  Ran Out of Food in the Last Year:   Transportation Needs:   . Film/video editor (Medical):   Marland Kitchen Lack of Transportation (Non-Medical):   Physical Activity:   . Days of Exercise per Week:   . Minutes of Exercise per Session:   Stress:   . Feeling of Stress :   Social Connections:   . Frequency of Communication with Friends and Family:   . Frequency of Social Gatherings with Friends and Family:   . Attends Religious Services:   . Active Member of Clubs or Organizations:   . Attends Archivist Meetings:   Marland Kitchen Marital Status:    Additional Social History:                         Sleep: Good  Appetite:  Good  Current Medications: Current Facility-Administered  Medications  Medication Dose Route Frequency Provider Last Rate Last Admin  . albuterol (VENTOLIN HFA) 108 (90 Base) MCG/ACT inhaler 2 puff  2 puff Inhalation Q4H PRN Mordecai Maes, NP      . ALPRAZolam [name]) tablet 1 mg  1 mg Oral BID PRN Sharma Covert, MD      . diphenhydrAMINE (BENADRYL) capsule 50 mg  50 mg Oral Q6H PRN Money, Lowry Ram, FNP   50 mg at 01/22/20 1327   Or  . diphenhydrAMINE (BENADRYL) injection 50 mg  50 mg Intramuscular Q6H PRN Money, Lowry Ram, FNP      . doxepin (SINEQUAN) capsule 25 mg  25 mg Oral QHS PRN Sharma Covert, MD   25 mg at 01/22/20 2145  . gabapentin (NEURONTIN) capsule 300 mg  300 mg Oral TID Sharma Covert, MD   300 mg at 01/24/20 1100  . HYDROcodone-acetaminophen (NORCO/VICODIN) 5-325 MG per tablet 1 tablet  1 tablet Oral Q8H PRN Sharma Covert, MD      . lidocaine (LIDODERM) 5 % 1 patch  1 patch Transdermal Q24H Cobos, Myer Peer, MD   1 patch at 01/24/20 1047  . LORazepam (ATIVAN) tablet 2 mg  2 mg Oral Q6H PRN Money, Lowry Ram, FNP   2 mg at 01/22/20 2145   Or  . LORazepam (ATIVAN) injection 2 mg  2 mg Intramuscular Q6H PRN Money, Lowry Ram, FNP   2 mg at 01/18/20 1535  . magnesium hydroxide (MILK OF MAGNESIA) suspension 30 mL  30 mL Oral Daily PRN Money, Darnelle Maffucci B, FNP   30 mL at 01/21/20 1413  . nicotine polacrilex (NICORETTE) gum 2 mg  2 mg Oral PRN Money, Lowry Ram, FNP   2 mg at 01/22/20 0751  . OLANZapine zydis (ZYPREXA) disintegrating tablet 5 mg  5 mg Oral Daily Sharma Covert, MD   5 mg at 01/24/20 0758  . paliperidone (INVEGA SUSTENNA) injection 234 mg  234 mg Intramuscular Q28 days Money, Lowry Ram, [place]   234 mg at 01/19/20 0910  . risperiDONE (RISPERDAL M-TABS) disintegrating tablet 4 mg  4 mg Oral QHS Sharma Covert, MD      . ziprasidone (GEODON) injection 20 mg  20 mg Intramuscular Q12H PRN Money, Lowry Ram, FNP   20 mg at 01/23/20 2200    Lab Results: No results found for this or any previous visit (from the past 48  hour(s)).  Blood Alcohol level:  Lab Results  Component Value Date   [facility] <10 01/15/2020   ETH <10 [id number]    Metabolic Disorder Labs: Lab Results  Component  Value Date   HGBA1C 5.6 01/17/2020   MPG 114.02 01/17/2020   MPG 111.15 06/25/2017   Lab Results  Component Value Date   PROLACTIN 16.1 (H) 01/17/2020   PROLACTIN 42.9 (H) 08/19/2017   Lab Results  Component Value Date   CHOL 154 01/17/2020   TRIG 186 (H) 01/17/2020   HDL 33 (L) 01/17/2020   CHOLHDL 4.7 01/17/2020   VLDL 37 01/17/2020   LDLCALC 84 01/17/2020   LDLCALC 126 (H) 06/25/2017    Physical Findings: AIMS:  , ,  ,  ,    CIWA:    COWS:     Musculoskeletal: Strength & Muscle Tone: within normal limits Gait & Station: unsteady Patient leans: N/A  Psychiatric Specialty Exam: Physical Exam  Nursing note and vitals reviewed. Constitutional: He is oriented to person, place, and time. He appears well-developed and well-nourished.  HENT:  Head: Normocephalic and atraumatic.  Respiratory: Effort normal.  Neurological: He is alert and oriented to person, place, and time.    Review of Systems  Blood pressure 121/83, pulse (!) 102, temperature 98.5 F (36.9 C), temperature source Oral, resp. rate 18, height 6\' 1"  (1.854 m), weight 85.7 kg, SpO2 98 %.Body mass index is 24.94 kg/m.  General Appearance: Disheveled  Eye Contact:  Minimal  Speech:  Pressured  Volume:  Increased  Mood:  Anxious, Dysphoric and Irritable  Affect:  Labile  Thought Process:  Goal Directed and Descriptions of Associations: Tangential  Orientation:  Negative  Thought Content:  Illogical, Delusions, Hallucinations: Auditory, Paranoid Ideation, Rumination and Tangential  Suicidal Thoughts:  No  Homicidal Thoughts:  No  Memory:  Immediate;   Fair Recent;   Fair Remote;   Fair  Judgement:  Impaired  Insight:  Lacking  Psychomotor Activity:  Increased  Concentration:  Concentration: Poor and Attention Span: Poor  Recall:   Poor  Fund of Knowledge:  Poor  Language:  Fair  Akathisia:  Negative  Handed:  Right  AIMS (if indicated):     Assets:  Desire for Improvement Resilience  ADL's:  Intact  Cognition:  WNL  Sleep:  Number of Hours: 6.25     Treatment Plan Summary: Daily contact with patient to assess and evaluate symptoms and progress in treatment, Medication management and Plan :    Diagnosis: #1 schizophrenia, #2 migraine headaches, #3 anxiety, #4 hyperlipidemia  Patient is seen in follow-up.  He remains unchanged from previous examination on 5/16.  I am going to increase his Risperdal to 4 mg p.o. twice daily.  I will also increase his Neurontin to 400 mg p.o.  3 times a day.  I will decrease his Xanax to 1 mg p.o. twice daily as needed anxiety, and decrease his hydrocodone to 1 tablet p.o. every 8 hours as needed pain.  His speech is impaired times, and I think that the Xanax is causing some frontal lobe disinhibition that leads to his worsening situation.  I have discussed the possibility of Clozaril with the patient, but he is unwilling to consider that at this time.  Hopefully these changes will contribute to improvement.  1.  Decrease Xanax to 1 mg p.o. twice daily as needed anxiety. 2.  Continue doxepin 25 mg p.o. nightly as needed insomnia. 3.  Increase gabapentin 400 mg p.o. 3 times daily for mood stability. 4.  Decrease hydrocodone to 1 tablet p.o. every 8 hours as needed pain. 5.  Continue Lidoderm patch every 12 hours for chronic pain issues. 6.  Continue Zyprexa  Zydis 5 mg, but changed to nightly for psychosis. 7.  Continue Risperdal 4 mg p.o. twice daily for psychosis. 8.  Continue ziprasidone 20 mg IM every 12 hours as needed agitation. 9.  Disposition planning-in progress.  Sharma Covert, MD 01/24/2020, 12:15 PM

## 2020-01-24 NOTE — Progress Notes (Signed)
Patient stated in group that what was positive about his day revolved around the meals and having bright sunshine. His goal for tomorrow is to have a better day than today. He was redirected over and over again for talking out of turn. He is denying pain at this time.

## 2020-01-24 NOTE — Progress Notes (Signed)
   01/24/20 0730  Psych Admission Type (Psych Patients Only)  Admission Status Involuntary  Psychosocial Assessment  Patient Complaints Irritability  Eye Contact Brief  Facial Expression Anxious;Animated  Affect Anxious  Speech Pressured;Tangential  Interaction Intrusive;Dominating;Demanding  Motor Activity Slow;Shuffling  Appearance/Hygiene Unremarkable  Behavior Characteristics Anxious;Irritable;Intrusive  Mood Irritable  Thought Process  Coherency WDL  Content Blaming others  Delusions None reported or observed  Perception WDL  Hallucination None reported or observed  Judgment Impaired  Confusion None  Danger to Self  Current suicidal ideation? Denies  Danger to Others  Danger to Others None reported or observed

## 2020-01-24 NOTE — Progress Notes (Signed)
Pt can be heard yelling in the dayroom. States, "these medicines you give me are killing me. I die every night when you give me these." Pt hard to redirect.

## 2020-01-25 MED ORDER — HYDROCODONE-ACETAMINOPHEN 5-325 MG PO TABS
1.0000 | ORAL_TABLET | Freq: Two times a day (BID) | ORAL | Status: DC | PRN
  Administered 2020-01-25 – 2020-02-02 (×13): 1 via ORAL
  Filled 2020-01-25 (×14): qty 1

## 2020-01-25 MED ORDER — OLANZAPINE 10 MG PO TBDP
20.0000 mg | ORAL_TABLET | Freq: Every day | ORAL | Status: DC
  Administered 2020-01-25: 20 mg via ORAL
  Filled 2020-01-25 (×2): qty 2

## 2020-01-25 MED ORDER — ALPRAZOLAM 0.5 MG PO TABS
1.0000 mg | ORAL_TABLET | Freq: Every day | ORAL | Status: DC | PRN
  Administered 2020-01-26 – 2020-01-29 (×3): 1 mg via ORAL
  Filled 2020-01-25 (×5): qty 2

## 2020-01-25 MED ORDER — ALPRAZOLAM 0.5 MG PO TABS: 1.0000 mg | ORAL_TABLET | ORAL | Status: DC | PRN

## 2020-01-25 MED ORDER — ZIPRASIDONE MESYLATE 20 MG IM SOLR
20.0000 mg | Freq: Once | INTRAMUSCULAR | Status: AC
  Administered 2020-01-25: 20 mg via INTRAMUSCULAR

## 2020-01-25 MED ORDER — OLANZAPINE 10 MG PO TBDP
10.0000 mg | ORAL_TABLET | Freq: Every day | ORAL | Status: DC
  Administered 2020-01-25 – 2020-01-31 (×7): 10 mg via ORAL
  Filled 2020-01-25 (×10): qty 1

## 2020-01-25 NOTE — Progress Notes (Signed)
[facility] MD Progress Note  01/25/2020 1:46 PM [name]  MRN:  [id number] Subjective:  Patient is a 49 year old male with a past psychiatric history significant for schizophrenia who was admitted on 5/10 after worsening aggressive behavior, contemplating suicide, and threatening his family. His father had reported that he flushed his medications several times prior to admission.  Objective: Patient is seen and examined.  Patient is a 49 year old male with the above-stated past psychiatric history who is seen in follow-up.  This morning he had not slept overnight.  He continued to be agitated, intrusive.  He has been on the telephone for hours at a time.  He demands multiple things.  He became so intrusive that he required an intramuscular injection of Geodon this morning.  He has slept thereafter.  He remained quite psychotic, tangential and agitated.  So that we can maximize his antipsychotic medications have continued to decrease his Xanax as well as his hydrocodone.  His vital signs are stable, he is afebrile.  He slept 2.5 hours last night.  No new laboratories.  Principal Problem: Schizophrenia, paranoid type ([place]) Diagnosis: Principal Problem:   Schizophrenia, paranoid type ([place])  Total Time spent with patient: 30 minutes  Past Psychiatric History: See admission H&P  Past Medical History:  Past Medical History:  Diagnosis Date  . Asthma   . CHF (congestive heart failure) ([place])   . Chronic back pain   . COPD (chronic obstructive pulmonary disease) ([place])   . Knee pain, chronic   . Migraines   . Schizophrenia, schizo-affective ([place])     Past Surgical History:  Procedure Laterality Date  . KNEE SURGERY     four times  . WISDOM TOOTH EXTRACTION     Family History:  Family History  Problem Relation Age of Onset  . Hypertension Father    Family Psychiatric  History: See admission H&P Social History:  Social History   Substance and Sexual Activity  Alcohol Use No   Comment: occ      Social History   Substance and Sexual Activity  Drug Use No   Comment: Pt denied    Social History   Socioeconomic History  . Marital status: Widowed    Spouse name: Not on file  . Number of children: Not on file  . Years of education: Not on file  . Highest education level: Not on file  Occupational History  . Occupation: on disability  Tobacco Use  . Smoking status: Current Every Day Smoker    Packs/day: 1.00    Years: 33.00    Pack years: 33.00    Types: Cigarettes  . Smokeless tobacco: Never Used  Substance and Sexual Activity  . Alcohol use: No    Comment: occ  . Drug use: No    Comment: Pt denied  . Sexual activity: Not Currently  Other Topics Concern  . Not on file  Social History Narrative   Pt is a widower.  He lives alone, and he receives SSI Disability.  Pt was followed by Dr. Darleene Cleaver, but he has not seen Dr. Darleene Cleaver in several months.   Social Determinants of Health   Financial Resource Strain:   . Difficulty of Paying Living Expenses:   Food Insecurity:   . Worried About Charity fundraiser in the Last Year:   . Arboriculturist in the Last Year:   Transportation Needs:   . Film/video editor (Medical):   Marland Kitchen Lack of Transportation (Non-Medical):   Physical  Activity:   . Days of Exercise per Week:   . Minutes of Exercise per Session:   Stress:   . Feeling of Stress :   Social Connections:   . Frequency of Communication with Friends and Family:   . Frequency of Social Gatherings with Friends and Family:   . Attends Religious Services:   . Active Member of Clubs or Organizations:   . Attends Archivist Meetings:   Marland Kitchen Marital Status:    Additional Social History:                         Sleep: Poor  Appetite:  Fair  Current Medications: Current Facility-Administered Medications  Medication Dose Route Frequency Provider Last Rate Last Admin  . albuterol (VENTOLIN HFA) 108 (90 Base) MCG/ACT inhaler 2 puff  2 puff  Inhalation Q4H PRN Mordecai Maes, NP      . ALPRAZolam [name]) tablet 1 mg  1 mg Oral Q3 days PRN Sharma Covert, MD      . diphenhydrAMINE (BENADRYL) capsule 50 mg  50 mg Oral Q6H PRN Money, Lowry Ram, FNP   50 mg at 01/25/20 0745   Or  . diphenhydrAMINE (BENADRYL) injection 50 mg  50 mg Intramuscular Q6H PRN Money, Lowry Ram, FNP      . doxepin (SINEQUAN) capsule 25 mg  25 mg Oral QHS PRN Sharma Covert, MD   25 mg at 01/25/20 0136  . gabapentin (NEURONTIN) capsule 300 mg  300 mg Oral TID Sharma Covert, MD   300 mg at 01/25/20 0745  . HYDROcodone-acetaminophen (NORCO/VICODIN) 5-325 MG per tablet 1 tablet  1 tablet Oral Q12H PRN Sharma Covert, MD      . lidocaine (LIDODERM) 5 % 1 patch  1 patch Transdermal Q24H Cobos, Myer Peer, MD   1 patch at 01/24/20 1047  . LORazepam (ATIVAN) tablet 2 mg  2 mg Oral Q6H PRN Money, Lowry Ram, FNP   2 mg at 01/25/20 0745   Or  . LORazepam (ATIVAN) injection 2 mg  2 mg Intramuscular Q6H PRN Money, Lowry Ram, FNP   2 mg at 01/18/20 1535  . magnesium hydroxide (MILK OF MAGNESIA) suspension 30 mL  30 mL Oral Daily PRN Money, Darnelle Maffucci B, FNP   30 mL at 01/21/20 1413  . nicotine polacrilex (NICORETTE) gum 2 mg  2 mg Oral PRN Money, Lowry Ram, FNP   2 mg at 01/22/20 0751  . OLANZapine zydis (ZYPREXA) disintegrating tablet 10 mg  10 mg Oral Daily Sharma Covert, MD      . OLANZapine zydis [facility]) disintegrating tablet 20 mg  20 mg Oral QHS Sharma Covert, MD      . paliperidone [facility] SUSTENNA) injection 234 mg  234 mg Intramuscular Q28 days Money, Lowry Ram, [place]   234 mg at 01/19/20 0910  . ziprasidone (GEODON) injection 20 mg  20 mg Intramuscular Q12H PRN Money, Lowry Ram, FNP   20 mg at 01/24/20 2040    Lab Results: No results found for this or any previous visit (from the past 48 hour(s)).  Blood Alcohol level:  Lab Results  Component Value Date   [facility] <10 01/15/2020   ETH <10 [id number]    Metabolic Disorder Labs: Lab Results   Component Value Date   HGBA1C 5.6 01/17/2020   MPG 114.02 01/17/2020   MPG 111.15 06/25/2017   Lab Results  Component Value Date   PROLACTIN 16.1 (H) 01/17/2020  PROLACTIN 42.9 (H) 08/19/2017   Lab Results  Component Value Date   CHOL 154 01/17/2020   TRIG 186 (H) 01/17/2020   HDL 33 (L) 01/17/2020   CHOLHDL 4.7 01/17/2020   VLDL 37 01/17/2020   LDLCALC 84 01/17/2020   LDLCALC 126 (H) 06/25/2017    Physical Findings: AIMS:  , ,  ,  ,    CIWA:    COWS:     Musculoskeletal: Strength & Muscle Tone: within normal limits Gait & Station: shuffle Patient leans: N/A  Psychiatric Specialty Exam: Physical Exam  Nursing note and vitals reviewed. Constitutional: He appears well-developed and well-nourished.  HENT:  Head: Normocephalic and atraumatic.  Respiratory: Effort normal.  Neurological: He is alert.    Review of Systems  Blood pressure 129/76, pulse 78, temperature 98.4 F (36.9 C), temperature source Oral, resp. rate 18, height 6\' 1"  (1.854 m), weight 85.7 kg, SpO2 98 %.Body mass index is 24.94 kg/m.  General Appearance: Disheveled  Eye Contact:  Fair  Speech:  Pressured  Volume:  Increased  Mood:  Anxious, Dysphoric and Irritable  Affect:  Labile  Thought Process:  Goal Directed and Descriptions of Associations: Tangential  Orientation:  Negative  Thought Content:  Delusions, Hallucinations: Auditory, Paranoid Ideation, Rumination and Tangential  Suicidal Thoughts:  No  Homicidal Thoughts:  No  Memory:  Immediate;   Poor Recent;   Poor Remote;   Poor  Judgement:  Impaired  Insight:  Lacking  Psychomotor Activity:  Increased  Concentration:  Concentration: Poor and Attention Span: Poor  Recall:  Poor  Fund of Knowledge:  Fair  Language:  Good  Akathisia:  Negative  Handed:  Right  AIMS (if indicated):     Assets:  Desire for Improvement Resilience  ADL's:  Intact  Cognition:  WNL  Sleep:  Number of Hours: 2.5     Treatment Plan  Summary: Daily contact with patient to assess and evaluate symptoms and progress in treatment, Medication management and Plan : Patient is seen and examined.  Patient is a 49 year old male with the above-stated past psychiatric history who is seen in follow-up.   Diagnosis: #1 schizophrenia, #2 migraine headaches, #3 anxiety, #4 hyperlipidemia  Patient is seen in follow-up.  This a.m. he was so agitated that he required an intramuscular injection of Geodon.  He calmed later, sleeping.  He did not sleep last night.  I reviewed his electronic medical record and found some from a previous [facility] hospitalization.  Hospitalization in March 2020 was very similar to the current hospitalization.  At that time he was discharged on a combination of Seroquel as well as olanzapine.  His doxepin and perphenazine were both stopped.  Prior to that psychiatric hospitalization he had had an overdose of the Xanax and hydrocodone.  He had received the paliperidone long-acting injection on 5/13.  I am going to change his Zyprexa to 10 mg p.o. daily and 20 mg p.o. nightly.  I will keep the Sinequan at this point at 25 mg p.o. nightly as needed insomnia.  I will reduce his Xanax to 1 mg p.o. daily as needed anxiety.  I will also reduce his hydrocodone to 1 tablet p.o. every 12 hours as needed pain.  He seems to have reacted well with the oral Geodon, and if the Zyprexa does not help tonight and may switch to the oral Geodon.  1.  Decrease Xanax to 1 mg p.o. daily as needed anxiety. 2.  Continue doxepin 25 mg p.o. nightly as  needed insomnia. 3.  Continue gabapentin 300 mg p.o. 3 times daily for chronic pain and mood stability. 4.  Reduce hydrocodone to 1 tablet every 12 hours as needed pain. 5.  Continue lidocaine patch 1 patch every 12 hours every 24 hours chronic pain. 6.  Continue lorazepam 2 mg p.o. or IM every 6 hours as needed severe anxiety or agitation. 7.  Change Zyprexa to 10 mg p.o. daily and 20 mg p.o. nightly for  psychosis. 8.  Patient received the long-acting paliperidone injection on 01/19/2020. 9.  Continue Geodon 20 mg IM every 12 hours as needed agitation. 10.  Disposition planning-in progress.  Sharma Covert, MD 01/25/2020, 1:46 PM

## 2020-01-25 NOTE — Progress Notes (Signed)
Pt back at nurse's station asking for medication to help him sleep. "I need something that will knock me the fuck out." Pt given Xanax 1 mg.

## 2020-01-25 NOTE — Plan of Care (Signed)
Progress note  D: pt found in the hallway knocking on the MD's door and yelling. Pt was noncompliant with giving the MD time. Pt was given IM PRN medication's which they took willingly. Pt continues to voice concerns with allergies towards many medications, but takes the medications and has yet to have a reaction. Pt has resting most of the morning. Pt was pleasant but continues to be delusional in thought. Pt denies si/hi/ah/vh and verbally agrees to approach staff if these become apparent or before harming themself/others while at [facility].  A: Pt provided support and encouragement. Pt given medication per protocol and standing orders. Q75m safety checks implemented and continued.  R: Pt safe on the unit. Will continue to monitor.  Pt progressing in the following metrics  Problem: Education: Goal: Knowledge of [facility] Education information/materials will improve Outcome: Progressing Goal: Verbalization of understanding the information provided will improve Outcome: Progressing   Problem: Activity: Goal: Will verbalize the importance of balancing activity with adequate rest periods Outcome: Progressing   Problem: Health Behavior/Discharge Planning: Goal: Compliance with prescribed medication regimen will improve Outcome: Progressing

## 2020-01-25 NOTE — Progress Notes (Signed)
Adult Psychoeducational Group Note  Date:  01/25/2020 Time:  9:17 PM  Group Topic/Focus:  Wrap-Up Group:   The focus of this group is to help patients review their daily goal of treatment and discuss progress on daily workbooks.  Participation Level:  Active  Participation Quality:  Monopolizing  Affect:  Labile  Cognitive:  Oriented  Insight: Improving  Engagement in Group:  Engaged  Modes of Intervention:  Education and Support  Additional Comments:  Patient attended and participated in group tonight. He reports that the doctor got mad today with him. To day he spoke with his family, talked to staff, played cards, watch television and socialized.  Salley Scarlet [facility] 01/25/2020, 9:17 PM

## 2020-01-25 NOTE — Progress Notes (Signed)
Pt back at nurse's station asking for coffee and a snack. Pt informed that these items can be given at 0600 when pts are awakened for VS assessment and breakfast. Pt has been up and down all night stating, "I have been asleep all night. I just woke up now and wanted a snack." Pt redirected and sent back to his room.

## 2020-01-25 NOTE — Progress Notes (Signed)
   01/24/20 2030  Psych Admission Type (Psych Patients Only)  Admission Status Involuntary  Psychosocial Assessment  Patient Complaints Irritability;Anger  Eye Contact Brief  Facial Expression Anxious;Animated  Affect Anxious;Irritable  Speech Pressured;Tangential;Loud  Interaction Intrusive;Dominating;Demanding  Motor Activity Slow;Shuffling  Appearance/Hygiene Unremarkable  Behavior Characteristics Agitated;Intrusive;Irritable  Mood Irritable  Thought Process  Coherency WDL  Content Blaming others  Delusions None reported or observed  Perception WDL  Hallucination None reported or observed  Judgment Impaired  Confusion None  Danger to Self  Current suicidal ideation? Denies  Danger to Others  Danger to Others None reported or observed   Pt seen in dayroom being loud and irritable. "Someone has been in my bathroom and peed all over the seat and the floor." Pt assured no one had been in his room.

## 2020-01-25 NOTE — Progress Notes (Signed)
Pt given Geodon 20 mg IM in the right deltoid for agitation and anxiety.

## 2020-01-25 NOTE — Progress Notes (Signed)
Pt seen at nurse's station. Asking for pain medication and something to make him sleep. Pt given Norco 1 tablet and Doxepin 25 mg.

## 2020-01-26 MED ORDER — GABAPENTIN 300 MG PO CAPS
600.0000 mg | ORAL_CAPSULE | Freq: Three times a day (TID) | ORAL | Status: DC
  Filled 2020-01-26 (×6): qty 2

## 2020-01-26 MED ORDER — OLANZAPINE 10 MG PO TBDP
30.0000 mg | ORAL_TABLET | Freq: Every day | ORAL | Status: DC
  Administered 2020-01-26 – 2020-01-29 (×4): 30 mg via ORAL
  Filled 2020-01-26 (×6): qty 3

## 2020-01-26 NOTE — Progress Notes (Signed)
Recreation Therapy Notes  Date: 5.20.21 Time: 0955 Location: [place]  Group Topic: Communication, Team Building, Problem Solving  Goal Area(s) Addresses:  Patient will effectively work with peer towards shared goal.  Patient will identify skill used to make activity successful.  Patient will identify how skills used during activity can be used to reach post d/c goals.   Behavioral Response: Engaged  Intervention: STEM Activity   Activity: In team's, patients were given 15 straws and 74ft of masking tape.  Patients were to build [name] free standing bridge that could hold 300 piece puzzle box.  Education: Education officer, community, Dentist.   Education Outcome: Acknowledges education/In group clarification offered/Needs additional education.   Clinical Observations/Feedback: Pt was engaged and worked well with peers.  Pt seemed to take on the role of the leader in the group.  Pt was attentive to the suggestions of his peers.  Pt was also mumbling to himself throughout group.     [name], LRT/CTRS    [name], [name] 01/26/2020 11:30 AM

## 2020-01-26 NOTE — Progress Notes (Signed)
[facility] MD Progress Note  01/26/2020 12:10 PM [name]  MRN:  [id number] Subjective:  Patient is a 49 year old male with a past psychiatric history significant for schizophrenia who was admitted on 5/10 after worsening aggressive behavior, contemplating suicide, and threatening his family. His father had reported that he flushed his medications several times prior to admission.  Objective: Patient is seen and examined.  Patient is a 49 year old male with the above-stated past psychiatric history who is seen in follow-up.  He received Geodon IM yesterday because of the lack of sleep and agitation.  He slept a lot during the day, and only slept approximately 3-1/2 hours last night.  This morning he is slightly better in terms of intrusive behaviors.  His delusional thinking about multiple issues including somatic delusions and paranoid delusions as well as grandiose delusions are still present.  He is just less demanding, less intrusive than he had been.  He was on the phone multiple times yesterday calling [facility] as well as his outpatient primary care doctor requesting multiple test for his multiple ailments.  This morning he does not seem to be as attracted to get to the phone as he had been.  I had considered using another mood stabilizer, but I reviewed the notes from the chart, the patient had earlier during the hospitalization totally refused Depakote.  I had spoken to him yesterday about Clozaril, but he is adamant that he will not take that as well.  His blood pressure is slightly elevated at 132/91.  Pulse is 70.  He is afebrile.  His pulse oximetry on room air is 99%.  No new laboratories.  Principal Problem: Schizophrenia, paranoid type ([place]) Diagnosis: Principal Problem:   Schizophrenia, paranoid type ([place])  Total Time spent with patient: 20 minutes  Past Psychiatric History: See admission H&P  Past Medical History:  Past Medical History:  Diagnosis Date  . Asthma   . CHF  (congestive heart failure) ([place])   . Chronic back pain   . COPD (chronic obstructive pulmonary disease) ([place])   . Knee pain, chronic   . Migraines   . Schizophrenia, schizo-affective ([place])     Past Surgical History:  Procedure Laterality Date  . KNEE SURGERY     four times  . WISDOM TOOTH EXTRACTION     Family History:  Family History  Problem Relation Age of Onset  . Hypertension Father    Family Psychiatric  History: See admission H&P Social History:  Social History   Substance and Sexual Activity  Alcohol Use No   Comment: occ     Social History   Substance and Sexual Activity  Drug Use No   Comment: Pt denied    Social History   Socioeconomic History  . Marital status: Widowed    Spouse name: Not on file  . Number of children: Not on file  . Years of education: Not on file  . Highest education level: Not on file  Occupational History  . Occupation: on disability  Tobacco Use  . Smoking status: Current Every Day Smoker    Packs/day: 1.00    Years: 33.00    Pack years: 33.00    Types: Cigarettes  . Smokeless tobacco: Never Used  Substance and Sexual Activity  . Alcohol use: No    Comment: occ  . Drug use: No    Comment: Pt denied  . Sexual activity: Not Currently  Other Topics Concern  . Not on file  Social History Narrative  Pt is a widower.  He lives alone, and he receives SSI Disability.  Pt was followed by Dr. Darleene Cleaver, but he has not seen Dr. Darleene Cleaver in several months.   Social Determinants of Health   Financial Resource Strain:   . Difficulty of Paying Living Expenses:   Food Insecurity:   . Worried About Charity fundraiser in the Last Year:   . Arboriculturist in the Last Year:   Transportation Needs:   . Film/video editor (Medical):   Marland Kitchen Lack of Transportation (Non-Medical):   Physical Activity:   . Days of Exercise per Week:   . Minutes of Exercise per Session:   Stress:   . Feeling of Stress :   Social Connections:   .  Frequency of Communication with Friends and Family:   . Frequency of Social Gatherings with Friends and Family:   . Attends Religious Services:   . Active Member of Clubs or Organizations:   . Attends Archivist Meetings:   Marland Kitchen Marital Status:    Additional Social History:                         Sleep: Poor  Appetite:  Good  Current Medications: Current Facility-Administered Medications  Medication Dose Route Frequency Provider Last Rate Last Admin  . albuterol (VENTOLIN HFA) 108 (90 Base) MCG/ACT inhaler 2 puff  2 puff Inhalation Q4H PRN Mordecai Maes, NP      . ALPRAZolam [name]) tablet 1 mg  1 mg Oral Daily PRN Sharma Covert, MD   1 mg at 01/26/20 0741  . diphenhydrAMINE (BENADRYL) capsule 50 mg  50 mg Oral Q6H PRN Money, Darnelle Maffucci B, FNP   50 mg at 01/25/20 1547   Or  . diphenhydrAMINE (BENADRYL) injection 50 mg  50 mg Intramuscular Q6H PRN Money, Lowry Ram, FNP      . doxepin (SINEQUAN) capsule 25 mg  25 mg Oral QHS PRN Sharma Covert, MD   25 mg at 01/25/20 2222  . gabapentin (NEURONTIN) capsule 600 mg  600 mg Oral TID Sharma Covert, MD      . HYDROcodone-acetaminophen (NORCO/VICODIN) 5-325 MG per tablet 1 tablet  1 tablet Oral Q12H PRN Sharma Covert, MD   1 tablet at 01/26/20 0457  . lidocaine (LIDODERM) 5 % 1 patch  1 patch Transdermal Q24H Cobos, Myer Peer, MD   1 patch at 01/24/20 1047  . LORazepam (ATIVAN) tablet 2 mg  2 mg Oral Q6H PRN Money, Lowry Ram, FNP   2 mg at 01/26/20 0741   Or  . LORazepam (ATIVAN) injection 2 mg  2 mg Intramuscular Q6H PRN Money, Lowry Ram, FNP   2 mg at 01/18/20 1535  . magnesium hydroxide (MILK OF MAGNESIA) suspension 30 mL  30 mL Oral Daily PRN Money, Darnelle Maffucci B, FNP   30 mL at 01/26/20 1111  . nicotine polacrilex (NICORETTE) gum 2 mg  2 mg Oral PRN Money, Lowry Ram, FNP   2 mg at 01/22/20 0751  . OLANZapine zydis (ZYPREXA) disintegrating tablet 10 mg  10 mg Oral Daily Sharma Covert, MD   10 mg at 01/26/20  0741  . OLANZapine zydis (ZYPREXA) disintegrating tablet 30 mg  30 mg Oral QHS Sharma Covert, MD      . paliperidone [facility] SUSTENNA) injection 234 mg  234 mg Intramuscular Q28 days Money, Lowry Ram, [place]   234 mg at 01/19/20 0910  .  ziprasidone (GEODON) injection 20 mg  20 mg Intramuscular Q12H PRN Money, Lowry Ram, FNP   20 mg at 01/24/20 2040    Lab Results: No results found for this or any previous visit (from the past 48 hour(s)).  Blood Alcohol level:  Lab Results  Component Value Date   ETH <10 01/15/2020   ETH <10 [id number]    Metabolic Disorder Labs: Lab Results  Component Value Date   HGBA1C 5.6 01/17/2020   MPG 114.02 01/17/2020   MPG 111.15 06/25/2017   Lab Results  Component Value Date   PROLACTIN 16.1 (H) 01/17/2020   PROLACTIN 42.9 (H) 08/19/2017   Lab Results  Component Value Date   CHOL 154 01/17/2020   TRIG 186 (H) 01/17/2020   HDL 33 (L) 01/17/2020   CHOLHDL 4.7 01/17/2020   VLDL 37 01/17/2020   LDLCALC 84 01/17/2020   LDLCALC 126 (H) 06/25/2017    Physical Findings: AIMS:  , ,  ,  ,    CIWA:    COWS:     Musculoskeletal: Strength & Muscle Tone: within normal limits Gait & Station: shuffle Patient leans: N/A  Psychiatric Specialty Exam: Physical Exam  Nursing note and vitals reviewed. Constitutional: He is oriented to person, place, and time. He appears well-developed and well-nourished.  HENT:  Head: Normocephalic and atraumatic.  Respiratory: Effort normal.  Neurological: He is alert and oriented to person, place, and time.    Review of Systems  Blood pressure (!) 132/91, pulse 70, temperature 98.4 F (36.9 C), temperature source Oral, resp. rate 18, height 6\' 1"  (1.854 m), weight 85.7 kg, SpO2 99 %.Body mass index is 24.94 kg/m.  General Appearance: Casual  Eye Contact:  Fair  Speech:  Pressured  Volume:  Normal  Mood:  Dysphoric and Irritable  Affect:  Labile  Thought Process:  Goal Directed and Descriptions of  Associations: Loose  Orientation:  Full (Time, Place, and Person)  Thought Content:  Delusions, Paranoid Ideation, Rumination and Tangential  Suicidal Thoughts:  No  Homicidal Thoughts:  No  Memory:  Immediate;   Fair Recent;   Fair Remote;   Fair  Judgement:  Impaired  Insight:  Lacking  Psychomotor Activity:  Increased  Concentration:  Concentration: Fair and Attention Span: Fair  Recall:  [employer] of Knowledge:  Fair  Language:  Fair  Akathisia:  Negative  Handed:  Right  AIMS (if indicated):     Assets:  Desire for Improvement Housing Resilience  ADL's:  Intact  Cognition:  Impaired,  Mild  Sleep:  Number of Hours: 3.5     Treatment Plan Summary: Daily contact with patient to assess and evaluate symptoms and progress in treatment, Medication management and Plan : Patient is seen and examined.  Patient is a 49 year old male with the above-stated past psychiatric history who is seen in follow-up.   Diagnosis: #1 schizophrenia, #2 migraine headaches, #3 anxiety, #4 hyperlipidemia  Patient is seen in follow-up.  He is slightly better with regard to his intrusive behaviors.  His delusions continue.  I was considering adding Tegretol or Depakote, but he is basically refused that in the past.  He has refused multiple other antipsychotic medications.  I do not want to increase the doxepin given the potential for overdose.  He has overdosed on medicines in the past.  I am going to go on and increase his Zyprexa to 10 mg p.o. daily and 30 mg p.o. nightly.  Hopefully as the long-acting paliperidone injection kicks in  we may be able to reduce this.  I am just trying to reduce the complicated medical regimen to something that will be doable by him since he lives independently.  He does have an ACTT team.  Since the amount of Zyprexa I will be giving him is significantly high we will repeat his EKG in the a.m. tomorrow to watch out for QTC prolongation.  In an attempt to control his mood I  will also increase his gabapentin to 600 mg p.o. 3 times daily.  Hopefully he will continue to slowly improve.  1.  Continue Xanax 1 mg p.o. daily as needed anxiety. 2.  Continue doxepin 25 mg p.o. nightly as needed insomnia. 3.  Increase gabapentin to 600 mg p.o. 3 times daily for mood stability and chronic pain. 4.  Continue Lidoderm patch 112 hours as needed pain. 5.  Increase Zyprexa to 10 mg daily and 30 mg p.o. nightly for psychosis, mood stability and sleep. 6.  Patient received the paliperidone long-acting injection 234 mg on 01/19/2020. 7.  EKG in a.m. tomorrow. 8.  Disposition planning-in progress.  Sharma Covert, MD 01/26/2020, 12:10 PM

## 2020-01-26 NOTE — Progress Notes (Signed)
   01/25/20 2300  Psych Admission Type (Psych Patients Only)  Admission Status Involuntary  Psychosocial Assessment  Patient Complaints Anxiety;Irritability  Eye Contact Brief  Facial Expression Animated;Anxious  Affect Anxious;Irritable;Labile  Speech Logical/coherent;Loud;Tangential  Interaction Assertive;Attention-seeking;Demanding;Intrusive  Motor Activity Shuffling;Slow;Unsteady  Appearance/Hygiene Unremarkable  Behavior Characteristics Cooperative;Agitated;Anxious;Intrusive  Mood Anxious;Labile  Thought Process  Coherency WDL  Content Ambivalence;Blaming others  Delusions Controlled;Persecutory  Perception WDL  Hallucination None reported or observed  Judgment Limited  Confusion None  Danger to Self  Current suicidal ideation? Denies  Danger to Others  Danger to Others None reported or observed  Pt. Attended evening group, has been interacting with the staff and his peers.  Pt. Is loud and tangential but mostly redirectable  Pt. Medicated as ordered and q 15 minute checks continue for safety.

## 2020-01-26 NOTE — Progress Notes (Signed)
   01/26/20 2020  Psych Admission Type (Psych Patients Only)  Admission Status Involuntary  Psychosocial Assessment  Patient Complaints None  Eye Contact Fair  Facial Expression Animated;Anxious  Affect Anxious;Irritable;Labile  Speech Logical/coherent;Loud;Tangential  Interaction Assertive;Attention-seeking;Demanding;Intrusive  Motor Activity Shuffling;Slow;Unsteady  Appearance/Hygiene Unremarkable  Behavior Characteristics Cooperative;Agitated  Mood Anxious;Labile  Thought Process  Coherency WDL  Content Blaming others  Delusions Controlled;Persecutory  Perception WDL  Hallucination None reported or observed  Judgment Limited  Confusion None  Danger to Self  Current suicidal ideation? Denies  Danger to Others  Danger to Others None reported or observed

## 2020-01-27 MED ORDER — VALPROIC ACID 250 MG PO CAPS
500.0000 mg | ORAL_CAPSULE | Freq: Two times a day (BID) | ORAL | Status: DC
  Administered 2020-01-27 – 2020-01-28 (×2): 500 mg via ORAL
  Filled 2020-01-27 (×7): qty 2

## 2020-01-27 MED ORDER — OXCARBAZEPINE 300 MG PO TABS
300.0000 mg | ORAL_TABLET | Freq: Two times a day (BID) | ORAL | Status: DC
  Filled 2020-01-27 (×2): qty 1

## 2020-01-27 MED ORDER — GABAPENTIN 400 MG PO CAPS
ORAL_CAPSULE | ORAL | Status: AC
  Filled 2020-01-27: qty 2

## 2020-01-27 MED ORDER — GABAPENTIN 400 MG PO CAPS
800.0000 mg | ORAL_CAPSULE | Freq: Three times a day (TID) | ORAL | Status: DC
  Administered 2020-01-27 – 2020-01-30 (×10): 800 mg via ORAL
  Filled 2020-01-27 (×12): qty 2

## 2020-01-27 NOTE — Progress Notes (Signed)
SPIRITUALITY GROUP NOTE  Spirituality group facilitated by Simone Curia, MDiv, [place].  Group Description:  Group focused on topic of hope.  Patients participated in facilitated discussion around topic, connecting with one another around experiences and definitions for hope.  Group members engaged with visual explorer photos, reflecting on what hope looks like for them today.  Group engaged in discussion around how their definitions of hope are present today in hospital.   Modalities: Psycho-social ed, Adlerian, Narrative, MI Patient Progress: [name] was present throughout group.  He fell asleep 10 minutes into group and did not engage in group process or activity.

## 2020-01-27 NOTE — [facility] Counselor (Signed)
CSW completed verbal screening and faxed referral to [facility] for this pt. Authorization number is [id number] and is good from 01/27/20 to 02/02/20.   CSW will continue to follow to determine if more documentation is required and/or if pt will be put on the [facility] wait list.    Darletta Moll MSW, [place] Worker  [facility]

## 2020-01-27 NOTE — Progress Notes (Addendum)
Pt up requesting medication" to knock me out". Pt informed if he would lay down the medications he took would help him go to sleep , pt stated he flushed his back patch down the toilet, pt informed he needed to return them to [place].

## 2020-01-27 NOTE — Tx Team (Signed)
Interdisciplinary Treatment and Diagnostic Plan Update  01/27/2020 Time of Session: 9:00am [name] MRN: [id number]  Principal Diagnosis: Schizophrenia, paranoid type [facility])  Secondary Diagnoses: Principal Problem:   Schizophrenia, paranoid type ([place])   Current Medications:  Current Facility-Administered Medications  Medication Dose Route Frequency Provider Last Rate Last Admin  . albuterol (VENTOLIN HFA) 108 (90 Base) MCG/ACT inhaler 2 puff  2 puff Inhalation Q4H PRN Mordecai Maes, NP      . ALPRAZolam [name]) tablet 1 mg  1 mg Oral Daily PRN Sharma Covert, MD   1 mg at 01/27/20 0747  . diphenhydrAMINE (BENADRYL) capsule 50 mg  50 mg Oral Q6H PRN Money, Lowry Ram, FNP   50 mg at 01/26/20 2202   Or  . diphenhydrAMINE (BENADRYL) injection 50 mg  50 mg Intramuscular Q6H PRN Money, Lowry Ram, FNP      . doxepin (SINEQUAN) capsule 25 mg  25 mg Oral QHS PRN Sharma Covert, MD   25 mg at 01/25/20 2222  . gabapentin (NEURONTIN) capsule 800 mg  800 mg Oral TID Sharma Covert, MD   800 mg at 01/27/20 0749  . HYDROcodone-acetaminophen (NORCO/VICODIN) 5-325 MG per tablet 1 tablet  1 tablet Oral Q12H PRN Sharma Covert, MD   1 tablet at 01/27/20 0828  . lidocaine (LIDODERM) 5 % 1 patch  1 patch Transdermal Q24H Cobos, Myer Peer, MD   1 patch at 01/24/20 1047  . LORazepam (ATIVAN) tablet 2 mg  2 mg Oral Q6H PRN Money, Lowry Ram, FNP   2 mg at 01/27/20 0747   Or  . LORazepam (ATIVAN) injection 2 mg  2 mg Intramuscular Q6H PRN Money, Lowry Ram, FNP   2 mg at 01/18/20 1535  . magnesium hydroxide (MILK OF MAGNESIA) suspension 30 mL  30 mL Oral Daily PRN Money, Darnelle Maffucci B, FNP   30 mL at 01/26/20 1111  . nicotine polacrilex (NICORETTE) gum 2 mg  2 mg Oral PRN Money, Lowry Ram, FNP   2 mg at 01/22/20 0751  . OLANZapine zydis (ZYPREXA) disintegrating tablet 10 mg  10 mg Oral Daily Sharma Covert, MD   10 mg at 01/27/20 0748  . OLANZapine zydis (ZYPREXA) disintegrating tablet 30 mg  30 mg  Oral QHS Sharma Covert, MD   30 mg at 01/26/20 2033  . Oxcarbazepine (TRILEPTAL) tablet 300 mg  300 mg Oral BID Sharma Covert, MD      . paliperidone [facility] SUSTENNA) injection 234 mg  234 mg Intramuscular Q28 days Money, Lowry Ram, [place]   234 mg at 01/19/20 0910  . ziprasidone (GEODON) injection 20 mg  20 mg Intramuscular Q12H PRN Money, Lowry Ram, FNP   20 mg at 01/27/20 0116   PTA Medications: Medications Prior to Admission  Medication Sig Dispense Refill Last Dose  . acetaminophen (TYLENOL) 500 MG tablet Take 1,000 mg by mouth every 6 (six) hours as needed for moderate pain.   Past Month at Unknown time  . albuterol (VENTOLIN HFA) 108 (90 Base) MCG/ACT inhaler Inhale 2 puffs into the lungs every 4 (four) hours as needed for wheezing or shortness of breath. 18 g 1 Past Month at Unknown time  . ALPRAZolam (XANAX) 1 MG tablet Take 1 mg by mouth 3 (three) times daily.    Past Month at Unknown time  . diclofenac sodium (VOLTAREN) 1 % GEL Apply 4 g topically 4 (four) times daily as needed. 500 g 6 Past Month at Unknown time  .  HYDROcodone-acetaminophen (NORCO) 10-325 MG tablet Take 1-2 tablets by mouth every 6 (six) hours as needed for moderate pain.   Past Month at Unknown time  . mupirocin ointment (BACTROBAN) 2 % Place 1 application into the nose 2 (two) times daily. 22 g 3 Past Month at Unknown time  . OLANZapine (ZYPREXA) 10 MG tablet Take 40 mg by mouth at bedtime.    Past Month at Unknown time  . perphenazine (TRILAFON) 4 MG tablet Take 1 tablet (4 mg total) by mouth 2 (two) times daily.   Past Month at Unknown time  . potassium chloride SA (KLOR-CON) 20 MEQ tablet Take 1 tablet (20 mEq total) by mouth 2 (two) times daily. 20 tablet 0 Past Month at Unknown time  . QUEtiapine (SEROQUEL) 25 MG tablet Take 75 mg by mouth daily.   Past Month at Unknown time  . acetaminophen (TYLENOL) 325 MG tablet Take 2 tablets (650 mg total) by mouth every 6 (six) hours as needed for mild pain, fever or  headache. (Patient not taking: Reported on 01/17/2020)   Completed Course at Unknown time  . azithromycin (ZITHROMAX) 250 MG tablet Take 1 tablet (250 mg total) by mouth daily. Take first 2 tablets together, then 1 every day until finished. (Patient not taking: Reported on 01/16/2020) 6 tablet 0 Completed Course at Unknown time  . fluPHENAZine decanoate (PROLIXIN) 25 MG/ML injection Inject into the muscle.       Patient Stressors: Financial difficulties Health problems Legal issue Medication change or noncompliance Substance abuse  Patient Strengths: Supportive family/friends  Treatment Modalities: Medication Management, Group therapy, Case management,  1 to 1 session with clinician, Psychoeducation, Recreational therapy.   Physician Treatment Plan for Primary Diagnosis: Schizophrenia, paranoid type ([place]) Long Term Goal(s): Improvement in symptoms so as ready for discharge Improvement in symptoms so as ready for discharge   Short Term Goals: Ability to identify changes in lifestyle to reduce recurrence of condition will improve Ability to identify and develop effective coping behaviors will improve Ability to demonstrate self-control will improve Ability to identify triggers associated with substance abuse/mental health issues will improve  Medication Management: Evaluate patient's response, side effects, and tolerance of medication regimen.  Therapeutic Interventions: 1 to 1 sessions, Unit Group sessions and Medication administration.  Evaluation of Outcomes: Not Progressing  Physician Treatment Plan for Secondary Diagnosis: Principal Problem:   Schizophrenia, paranoid type ([place])  Long Term Goal(s): Improvement in symptoms so as ready for discharge Improvement in symptoms so as ready for discharge   Short Term Goals: Ability to identify changes in lifestyle to reduce recurrence of condition will improve Ability to identify and develop effective coping behaviors will  improve Ability to demonstrate self-control will improve Ability to identify triggers associated with substance abuse/mental health issues will improve     Medication Management: Evaluate patient's response, side effects, and tolerance of medication regimen.  Therapeutic Interventions: 1 to 1 sessions, Unit Group sessions and Medication administration.  Evaluation of Outcomes: Not Progressing   RN Treatment Plan for Primary Diagnosis: Schizophrenia, paranoid type ([place]) Long Term Goal(s): Knowledge of disease and therapeutic regimen to maintain health will improve  Short Term Goals: Ability to verbalize frustration and anger appropriately will improve, Ability to participate in decision making will improve, Ability to identify and develop effective coping behaviors will improve and Compliance with prescribed medications will improve  Medication Management: RN will administer medications as ordered by provider, will assess and evaluate patient's response and provide education to patient for prescribed  medication. RN will report any adverse and/or side effects to prescribing provider.  Therapeutic Interventions: 1 on 1 counseling sessions, Psychoeducation, Medication administration, Evaluate responses to treatment, Monitor vital signs and CBGs as ordered, Perform/monitor CIWA, COWS, AIMS and Fall Risk screenings as ordered, Perform wound care treatments as ordered.  Evaluation of Outcomes: Not Progressing   LCSW Treatment Plan for Primary Diagnosis: Schizophrenia, paranoid type ([place]) Long Term Goal(s): Safe transition to appropriate next level of care at discharge, Engage patient in therapeutic group addressing interpersonal concerns.  Short Term Goals: Engage patient in aftercare planning with referrals and resources, Increase emotional regulation and Increase skills for wellness and recovery  Therapeutic Interventions: Assess for all discharge needs, 1 to 1 time with Social worker, Explore  available resources and support systems, Assess for adequacy in community support network, Educate family and significant other(s) on suicide prevention, Complete Psychosocial Assessment, Interpersonal group therapy.  Evaluation of Outcomes: Progressing  Progress in Treatment: Attending groups: Yes. Participating in groups: Yes. Taking medication as prescribed: Yes. Med seeking.  Toleration medication: Yes. Family/Significant other contact made: No, will contact:  declined collateral consents. Patient understands diagnosis: Yes. Discussing patient identified problems/goals with staff: Yes. Medical problems stabilized or resolved: Yes. Denies suicidal/homicidal ideation: Yes. Issues/concerns per patient self-inventory: No.  New problem(s) identified: Yes, Describe:  limited social supports, medication non-compliance.  New Short Term/Long Term Goal(s): medication management for mood stabilization; elimination of SI thoughts; development of comprehensive mental wellness/sobriety plan.  Patient Goals:  "Discharge."  Discharge Plan or Barriers: Patient has his own apartment in [place]. Is established with [facility] and agrees to continue services with them once discharged.   Reason for Continuation of Hospitalization: Aggression Mania Medication stabilization  Estimated Length of Stay: 3-5 days Attendees: Patient:  01/27/2020   Physician:  01/27/2020   Nursing:  01/27/2020   RN Care Manager: 01/27/2020   Social Worker: Darletta Moll, Latanya Presser 01/27/2020   Recreational Therapist:  01/27/2020   Other: Marvia Pickles, NP 01/27/2020   Other:  01/27/2020   Other: 01/27/2020     Scribe for Treatment Team: Vassie Moselle, [place] 01/27/2020 8:58 AM

## 2020-01-27 NOTE — Progress Notes (Signed)
MHT informed this Probation officer that pt not sleeping. Reported that pt yelling in room and screaming. Pt told this Probation officer that "the voices are loud and screaming at me and saying things that aren't nice. They sound like people I know but they are not." Pt given Geodon 20 mg IM to the right deltoid. Will continue to monitor.

## 2020-01-27 NOTE — Progress Notes (Signed)
Pt constantly coming out the room making disrespectful remarks and needing constant redirection

## 2020-01-27 NOTE — Progress Notes (Signed)
Pt continues to need frequent redirection. Pt complains about medications not being right, pt demanding at times

## 2020-01-27 NOTE — Progress Notes (Signed)
[facility] MD Progress Note  01/27/2020 10:57 AM [name]  MRN:  [id number] Subjective:  Patient is a 49 year old male with a past psychiatric history significant for schizophrenia who was admitted on 5/10 after worsening aggressive behavior, contemplating suicide, and threatening his family. His father had reported that he flushed his medications several times prior to admission.  Objective: Patient is seen and examined.  Patient is a 49 year old male with the above-stated past psychiatric history who is seen in follow-up.  Unfortunately he again had a bad night.  He had received Geodon because of agitation.  Despite the Geodon he still only slept 3.5 hours.  He is on the phone multiple times this morning.  He is at times agitated, angry and obscene towards the people on the phone.  We discussed today the potential for having to go to the state hospital for continued improvement, or at least to consider return to Depakote or Clozaril.  He has agreed to take Depakene.  He still is quite delusional.  These delusions are both paranoid, grandiose and at times somatic.  He is still very med seeking towards the Xanax and pain medication.  Review of his admission laboratories revealed his AST and ALT to be normal.  His CBC was normal as well.  He complains of upper respiratory tract symptoms.  His vital signs are stable, he is afebrile.  He stated this morning to nursing staff on the pain scale that he is in 10 out of 10 pain.  Principal Problem: Schizophrenia, paranoid type ([place]) Diagnosis: Principal Problem:   Schizophrenia, paranoid type ([place])  Total Time spent with patient: 20 minutes  Past Psychiatric History: See admission H&P  Past Medical History:  Past Medical History:  Diagnosis Date  . Asthma   . CHF (congestive heart failure) ([place])   . Chronic back pain   . COPD (chronic obstructive pulmonary disease) ([place])   . Knee pain, chronic   . Migraines   . Schizophrenia, schizo-affective ([place])      Past Surgical History:  Procedure Laterality Date  . KNEE SURGERY     four times  . WISDOM TOOTH EXTRACTION     Family History:  Family History  Problem Relation Age of Onset  . Hypertension Father    Family Psychiatric  History: See admission H&P Social History:  Social History   Substance and Sexual Activity  Alcohol Use No   Comment: occ     Social History   Substance and Sexual Activity  Drug Use No   Comment: Pt denied    Social History   Socioeconomic History  . Marital status: Widowed    Spouse name: Not on file  . Number of children: Not on file  . Years of education: Not on file  . Highest education level: Not on file  Occupational History  . Occupation: on disability  Tobacco Use  . Smoking status: Current Every Day Smoker    Packs/day: 1.00    Years: 33.00    Pack years: 33.00    Types: Cigarettes  . Smokeless tobacco: Never Used  Substance and Sexual Activity  . Alcohol use: No    Comment: occ  . Drug use: No    Comment: Pt denied  . Sexual activity: Not Currently  Other Topics Concern  . Not on file  Social History Narrative   Pt is a widower.  He lives alone, and he receives SSI Disability.  Pt was followed by Dr. Darleene Cleaver, but he has not  seen Dr. Darleene Cleaver in several months.   Social Determinants of Health   Financial Resource Strain:   . Difficulty of Paying Living Expenses:   Food Insecurity:   . Worried About Charity fundraiser in the Last Year:   . Arboriculturist in the Last Year:   Transportation Needs:   . Film/video editor (Medical):   Marland Kitchen Lack of Transportation (Non-Medical):   Physical Activity:   . Days of Exercise per Week:   . Minutes of Exercise per Session:   Stress:   . Feeling of Stress :   Social Connections:   . Frequency of Communication with Friends and Family:   . Frequency of Social Gatherings with Friends and Family:   . Attends Religious Services:   . Active Member of Clubs or Organizations:   .  Attends Archivist Meetings:   Marland Kitchen Marital Status:    Additional Social History:                         Sleep: Poor  Appetite:  Fair  Current Medications: Current Facility-Administered Medications  Medication Dose Route Frequency Provider Last Rate Last Admin  . albuterol (VENTOLIN HFA) 108 (90 Base) MCG/ACT inhaler 2 puff  2 puff Inhalation Q4H PRN Mordecai Maes, NP      . ALPRAZolam [name]) tablet 1 mg  1 mg Oral Daily PRN Sharma Covert, MD   1 mg at 01/27/20 0747  . diphenhydrAMINE (BENADRYL) capsule 50 mg  50 mg Oral Q6H PRN Money, Lowry Ram, FNP   50 mg at 01/26/20 2202   Or  . diphenhydrAMINE (BENADRYL) injection 50 mg  50 mg Intramuscular Q6H PRN Money, Lowry Ram, FNP      . doxepin (SINEQUAN) capsule 25 mg  25 mg Oral QHS PRN Sharma Covert, MD   25 mg at 01/25/20 2222  . gabapentin (NEURONTIN) capsule 800 mg  800 mg Oral TID Sharma Covert, MD   800 mg at 01/27/20 0749  . HYDROcodone-acetaminophen (NORCO/VICODIN) 5-325 MG per tablet 1 tablet  1 tablet Oral Q12H PRN Sharma Covert, MD   1 tablet at 01/27/20 0828  . lidocaine (LIDODERM) 5 % 1 patch  1 patch Transdermal Q24H Cobos, Myer Peer, MD   1 patch at 01/24/20 1047  . LORazepam (ATIVAN) tablet 2 mg  2 mg Oral Q6H PRN Money, Lowry Ram, FNP   2 mg at 01/27/20 0747   Or  . LORazepam (ATIVAN) injection 2 mg  2 mg Intramuscular Q6H PRN Money, Lowry Ram, FNP   2 mg at 01/18/20 1535  . magnesium hydroxide (MILK OF MAGNESIA) suspension 30 mL  30 mL Oral Daily PRN Money, Darnelle Maffucci B, FNP   30 mL at 01/26/20 1111  . nicotine polacrilex (NICORETTE) gum 2 mg  2 mg Oral PRN Money, Lowry Ram, FNP   2 mg at 01/22/20 0751  . OLANZapine zydis (ZYPREXA) disintegrating tablet 10 mg  10 mg Oral Daily Sharma Covert, MD   10 mg at 01/27/20 0748  . OLANZapine zydis (ZYPREXA) disintegrating tablet 30 mg  30 mg Oral QHS Sharma Covert, MD   30 mg at 01/26/20 2033  . paliperidone (INVEGA SUSTENNA) injection  234 mg  234 mg Intramuscular Q28 days Money, Lowry Ram, [place]   234 mg at 01/19/20 0910  . valproic acid (DEPAKENE) 250 MG capsule 500 mg  500 mg Oral BID Sharma Covert, MD      .  ziprasidone (GEODON) injection 20 mg  20 mg Intramuscular Q12H PRN Money, Lowry Ram, FNP   20 mg at 01/27/20 0116    Lab Results: No results found for this or any previous visit (from the past 48 hour(s)).  Blood Alcohol level:  Lab Results  Component Value Date   ETH <10 01/15/2020   ETH <10 [id number]    Metabolic Disorder Labs: Lab Results  Component Value Date   HGBA1C 5.6 01/17/2020   MPG 114.02 01/17/2020   MPG 111.15 06/25/2017   Lab Results  Component Value Date   PROLACTIN 16.1 (H) 01/17/2020   PROLACTIN 42.9 (H) 08/19/2017   Lab Results  Component Value Date   CHOL 154 01/17/2020   TRIG 186 (H) 01/17/2020   HDL 33 (L) 01/17/2020   CHOLHDL 4.7 01/17/2020   VLDL 37 01/17/2020   LDLCALC 84 01/17/2020   LDLCALC 126 (H) 06/25/2017    Physical Findings: AIMS:  , ,  ,  ,    CIWA:    COWS:     Musculoskeletal: Strength & Muscle Tone: within normal limits Gait & Station: shuffle Patient leans: N/A  Psychiatric Specialty Exam: Physical Exam  Nursing note and vitals reviewed. Constitutional: He is oriented to person, place, and time. He appears well-developed and well-nourished.  HENT:  Head: Normocephalic and atraumatic.  Respiratory: Effort normal.  Neurological: He is alert and oriented to person, place, and time.    Review of Systems  Blood pressure 112/82, pulse 93, temperature 98.4 F (36.9 C), temperature source Oral, resp. rate 18, height 6\' 1"  (1.854 m), weight 85.7 kg, SpO2 98 %.Body mass index is 24.94 kg/m.  General Appearance: Casual  Eye Contact:  Fair  Speech:  Pressured  Volume:  Increased  Mood:  Dysphoric and Irritable  Affect:  Labile  Thought Process:  Goal Directed and Descriptions of Associations: Tangential  Orientation:  Negative  Thought Content:   Delusions, Paranoid Ideation, Rumination and Tangential  Suicidal Thoughts:  No  Homicidal Thoughts:  No  Memory:  Immediate;   Poor Recent;   Poor Remote;   Poor  Judgement:  Impaired  Insight:  Lacking  Psychomotor Activity:  Increased  Concentration:  Concentration: Poor and Attention Span: Poor  Recall:  Poor  Fund of Knowledge:  Fair  Language:  Fair  Akathisia:  Negative  Handed:  Right  AIMS (if indicated):     Assets:  Desire for Improvement Resilience  ADL's:  Intact  Cognition:  WNL  Sleep:  Number of Hours: 3.5     Treatment Plan Summary: Daily contact with patient to assess and evaluate symptoms and progress in treatment, Medication management and Plan : Patient is seen and examined.  Patient is a 49 year old male with the above-stated past psychiatric history who is seen in follow-up.   Diagnosis: #1 schizophrenia, #2 migraine headaches, #3 anxiety, #4 hyperlipidemia  Patient is seen in follow-up.  He is essentially unchanged.  The gabapentin and Trileptal of had really no benefit on his manic behaviors.  He is really maxed out on the Zyprexa, and has already received the long-acting paliperidone injection.  I gave him the option today of Clozaril versus Depakote.  He stated he is willing to try the Depakene.  We will start that at 500 mg p.o. twice daily.  I will stop the Trileptal today.  I will add Voltaren gel for his arthritic pain.  The Xanax and narcotic pain medication will continue to be as needed for now.  1.  Continue Xanax 1 mg p.o. daily as needed anxiety. 2.  Continue doxepin and 25 mg p.o. nightly as needed insomnia. 3.  Increase gabapentin to 800 mg p.o. 3 times daily for mood stability as well as chronic pain. 4.  Continue hydrocodone 1 tablet p.o. every 12 hours as needed pain. 5.  Continue lidocaine patch 1 patch every 24 hours for chronic pain. 6.  Continue lorazepam 2 mg IM or p.o. every 6 hours as needed severe anxiety or agitation. 7.  Continue  Zyprexa 10 mg p.o. daily and 30 mg p.o. nightly for psychosis and mood stability. 8.  Patient received the long-acting Invega injection to 34 mg on 01/19/20. 9.  Depakene 500 mg p.o. twice daily for mood stability. 10.  Continue Geodon 20 mg IM every 12 hours as needed agitation. 11.  Stop Trileptal. 12.  Disposition planning-I have spoken to social work about a referral to [facility]. Sharma Covert, MD 01/27/2020, 10:57 AM

## 2020-01-27 NOTE — Progress Notes (Signed)
   01/27/20 2100  Psych Admission Type (Psych Patients Only)  Admission Status Involuntary  Psychosocial Assessment  Patient Complaints None  Eye Contact Fair  Facial Expression Animated;Anxious  Affect Anxious;Irritable;Labile  Speech Logical/coherent;Loud;Tangential  Interaction Assertive;Attention-seeking;Demanding;Intrusive  Motor Activity Shuffling;Slow;Unsteady  Appearance/Hygiene Unremarkable  Behavior Characteristics Cooperative  Mood Anxious  Thought Process  Coherency WDL  Content Blaming others  Delusions Controlled;Persecutory  Perception WDL  Hallucination None reported or observed  Judgment Limited  Confusion None  Danger to Self  Current suicidal ideation? Denies  Danger to Others  Danger to Others None reported or observed

## 2020-01-28 MED ORDER — DIVALPROEX SODIUM 500 MG PO DR TAB
500.0000 mg | DELAYED_RELEASE_TABLET | Freq: Two times a day (BID) | ORAL | Status: DC
  Administered 2020-01-29 – 2020-01-30 (×3): 500 mg via ORAL
  Filled 2020-01-28 (×6): qty 1

## 2020-01-28 NOTE — [facility] Group Notes (Signed)
  [facility] LCSW Group Therapy Note  Date/Time:  01/28/2020 11:15AM-12:00PM  Type of Therapy and Topic:  Group Therapy:  Feelings About Hospitalization  Participation Level:  Active   Description of Group This process group involved patients discussing their feelings related to being hospitalized, as well as the benefits they see to being in the hospital.  These feelings and benefits were itemized.  The group then brainstormed specific ways in which they could seek those same benefits when they discharge and return home.  Therapeutic Goals 1. Patient will identify and describe positive and negative feelings related to hospitalization 2. Patient will verbalize benefits of hospitalization to themselves personally 3. Patients will brainstorm together ways they can obtain similar benefits in the outpatient setting, identify barriers to wellness and possible solutions  Summary of Patient Progress:  The patient expressed his primary feelings about being hospitalized are "okay" initially, then said he did not like the fact that people were saying he needed to go to [facility] and expressed considerable paranoia.  He claimed the nurses have change the doctor's orders with the intention of getting him angry.  He said he has made staff angry lately and yesterday when he was confrontational with doctor, he was given 20 mg of Geodon.  He added, "I have to lie and tell them want they want to hear because they will do whatever they want."  When it was pointed out to him that this didn't sound like "okay" feelings of being hospitalized, he was able to agree that he had just said that earlier and did not mean it.  Throughout group the patient verbalized things either to another patient or under his breath as though responding to internal stimuli.  He could be redirected but only momentarily before resuming.  He remained pleasant throughout group but was clear in continued psycohsis.  Therapeutic  Modalities Cognitive Behavioral Therapy Motivational Interviewing    Selmer Dominion, LCSW 01/28/2020, 2:56 PM

## 2020-01-28 NOTE — Progress Notes (Signed)
   01/28/20 1935  COVID-19 Daily Checkoff  Have you had a fever (temp > 37.80C/100F)  in the past 24 hours?  No  If you have had runny nose, nasal congestion, sneezing in the past 24 hours, has it worsened? No  COVID-19 EXPOSURE  Have you traveled outside the state in the past 14 days? No  Have you been in contact with someone with a confirmed diagnosis of COVID-19 or PUI in the past 14 days without wearing appropriate PPE? No  Have you been living in the same home as a person with confirmed diagnosis of COVID-19 or a PUI (household contact)? No  Have you been diagnosed with COVID-19? No

## 2020-01-28 NOTE — Progress Notes (Signed)
Patient has been very fidgety and restless tonight. He has to be redirected from the nursing station constantly. He will come up and mumble about his medications not being right. He has been asking intrusive with staff. He has been on the phone twice tonight using profanity on the hall phone. Each time he was getting more agitated. He called up to the front desk and asked could someone help him get out of here. Patient argues with Probation officer about his medications not being correct. He request medication for psychosis or schizophrenia. He received IM of Geodon and returned to his room rest. Safety maintained with 15 min checks.

## 2020-01-28 NOTE — Progress Notes (Signed)
[facility] MD Progress Note  01/28/2020 11:57 AM [name]  MRN:  [id number] Subjective:  Patient reports " I am average". Reports intermittent auditory hallucinations, which say " you know everything" and also states he hears sister's voice making sexual statements . Denies medication side effects.  Objective: Patient seen, chart reviewed . 49 year old male, admitted under IVC due to medication non compliance, suicidal ideations,hallucinations,  aggressive behaviors/threats . History of chronic mental illness, has been diagnosed with Schizoaffective Disorder in the past .  Patient presents alert, today vaguely irritable but cooperative on approach, without psychomotor agitation at this time. He reports persistent auditory hallucinations, and states he heard his sister's voice earlier telling him to perform a sexual act . He presents with disorganized thought process and becomes tangential , making statements such as :" they just want to change everybody's eyes to brown, I don't want to mess with people's eyes ". Staff reports that patient has improved somewhat compared to his initial presentation but that he continues to present demanding / make disrespectful remarks and to need frequent redirection from staff . Does not endorse medication side effects.   Principal Problem: Schizophrenia, paranoid type (HCC) Diagnosis: Principal Problem:   Schizophrenia, paranoid type (HCC)  Total Time spent with patient: 20 minutes  Past Psychiatric History: See admission H&P  Past Medical History:  Past Medical History:  Diagnosis Date  . Asthma   . CHF (congestive heart failure) (HCC)   . Chronic back pain   . COPD (chronic obstructive pulmonary disease) (HCC)   . Knee pain, chronic   . Migraines   . Schizophrenia, schizo-affective (HCC)     Past Surgical History:  Procedure Laterality Date  . KNEE SURGERY     four times  . WISDOM TOOTH EXTRACTION     Family History:  Family History  Problem  Relation Age of Onset  . Hypertension Father    Family Psychiatric  History: See admission H&P Social History:  Social History   Substance and Sexual Activity  Alcohol Use No   Comment: occ     Social History   Substance and Sexual Activity  Drug Use No   Comment: Pt denied    Social History   Socioeconomic History  . Marital status: Widowed    Spouse name: Not on file  . Number of children: Not on file  . Years of education: Not on file  . Highest education level: Not on file  Occupational History  . Occupation: on disability  Tobacco Use  . Smoking status: Current Every Day Smoker    Packs/day: 1.00    Years: 33.00    Pack years: 33.00    Types: Cigarettes  . Smokeless tobacco: Never Used  Substance and Sexual Activity  . Alcohol use: No    Comment: occ  . Drug use: No    Comment: Pt denied  . Sexual activity: Not Currently  Other Topics Concern  . Not on file  Social History Narrative   Pt is a widower.  He lives alone, and he receives SSI Disability.  Pt was followed by Dr. Jannifer Franklin, but he has not seen Dr. Jannifer Franklin in several months.   Social Determinants of Health   Financial Resource Strain:   . Difficulty of Paying Living Expenses:   Food Insecurity:   . Worried About Programme researcher, broadcasting/film/video in the Last Year:   . Barista in the Last Year:   Transportation Needs:   .  Lack of Transportation (Medical):   Marland Kitchen Lack of Transportation (Non-Medical):   Physical Activity:   . Days of Exercise per Week:   . Minutes of Exercise per Session:   Stress:   . Feeling of Stress :   Social Connections:   . Frequency of Communication with Friends and Family:   . Frequency of Social Gatherings with Friends and Family:   . Attends Religious Services:   . Active Member of Clubs or Organizations:   . Attends Banker Meetings:   Marland Kitchen Marital Status:    Additional Social History:   Sleep: Fair  Appetite:  Fair  Current Medications: Current  Facility-Administered Medications  Medication Dose Route Frequency Provider Last Rate Last Admin  . albuterol (VENTOLIN HFA) 108 (90 Base) MCG/ACT inhaler 2 puff  2 puff Inhalation Q4H PRN Denzil Magnuson, NP      . ALPRAZolam Prudy Feeler) tablet 1 mg  1 mg Oral Daily PRN Antonieta Pert, MD   1 mg at 01/27/20 0747  . diphenhydrAMINE (BENADRYL) capsule 50 mg  50 mg Oral Q6H PRN Money, Gerlene Burdock, FNP   50 mg at 01/27/20 2106   Or  . diphenhydrAMINE (BENADRYL) injection 50 mg  50 mg Intramuscular Q6H PRN Money, Feliz Beam B, FNP      . divalproex (DEPAKOTE) DR tablet 500 mg  500 mg Oral BID Antonieta Pert, MD      . doxepin [facility]) capsule 25 mg  25 mg Oral QHS PRN Antonieta Pert, MD   25 mg at 01/27/20 2107  . gabapentin (NEURONTIN) capsule 800 mg  800 mg Oral TID Antonieta Pert, MD   800 mg at 01/28/20 0758  . HYDROcodone-acetaminophen (NORCO/VICODIN) 5-325 MG per tablet 1 tablet  1 tablet Oral Q12H PRN Antonieta Pert, MD   1 tablet at 01/27/20 2107  . lidocaine (LIDODERM) 5 % 1 patch  1 patch Transdermal Q24H Gem Conkle, Rockey Situ, MD   1 patch at 01/27/20 1200  . LORazepam (ATIVAN) tablet 2 mg  2 mg Oral Q6H PRN Money, Gerlene Burdock, FNP   2 mg at 01/28/20 0850   Or  . LORazepam (ATIVAN) injection 2 mg  2 mg Intramuscular Q6H PRN Money, Gerlene Burdock, FNP   2 mg at 01/18/20 1535  . magnesium hydroxide (MILK OF MAGNESIA) suspension 30 mL  30 mL Oral Daily PRN Money, Feliz Beam B, FNP   30 mL at 01/26/20 1111  . nicotine polacrilex (NICORETTE) gum 2 mg  2 mg Oral PRN Money, Gerlene Burdock, FNP   2 mg at 01/22/20 0751  . OLANZapine zydis (ZYPREXA) disintegrating tablet 10 mg  10 mg Oral Daily Antonieta Pert, MD   10 mg at 01/28/20 0759  . OLANZapine zydis (ZYPREXA) disintegrating tablet 30 mg  30 mg Oral QHS Antonieta Pert, MD   30 mg at 01/27/20 2107  . paliperidone (INVEGA SUSTENNA) injection 234 mg  234 mg Intramuscular Q28 days Money, Gerlene Burdock, [place]   234 mg at 01/19/20 0910  . ziprasidone  (GEODON) injection 20 mg  20 mg Intramuscular Q12H PRN Money, Gerlene Burdock, FNP   20 mg at 01/27/20 0116    Lab Results: No results found for this or any previous visit (from the past 48 hour(s)).  Blood Alcohol level:  Lab Results  Component Value Date   [facility] <10 01/15/2020   ETH <10 02/26/2019    Metabolic Disorder Labs: Lab Results  Component Value Date   HGBA1C 5.6 01/17/2020  MPG 114.02 01/17/2020   MPG 111.15 06/25/2017   Lab Results  Component Value Date   PROLACTIN 16.1 (H) 01/17/2020   PROLACTIN 42.9 (H) 08/19/2017   Lab Results  Component Value Date   CHOL 154 01/17/2020   TRIG 186 (H) 01/17/2020   HDL 33 (L) 01/17/2020   CHOLHDL 4.7 01/17/2020   VLDL 37 01/17/2020   LDLCALC 84 01/17/2020   LDLCALC 126 (H) 06/25/2017    Physical Findings: AIMS:  , ,  ,  ,    CIWA:    COWS:     Musculoskeletal: Strength & Muscle Tone: within normal limits Gait & Station: shuffle Patient leans: N/A  Psychiatric Specialty Exam: Physical Exam  Nursing note and vitals reviewed. Constitutional: He is oriented to person, place, and time. He appears well-developed and well-nourished.  HENT:  Head: Normocephalic and atraumatic.  Respiratory: Effort normal.  Neurological: He is alert and oriented to person, place, and time.    Review of Systems reports back pain, no chest pain, no shortness of breath at room air, no vomiting  Blood pressure (!) 112/92, pulse 90, temperature 98.1 F (36.7 C), temperature source Oral, resp. rate 18, height 6\' 1"  (1.854 m), weight 85.7 kg, SpO2 100 %.Body mass index is 24.94 kg/m.  General Appearance: Casual  Eye Contact:  Fair  Speech:  Pressured and rambling at times  Volume:  Normal  Mood:  Reports mood is "average"  Affect:  Remains labile and irritable at times  Thought Process:  Disorganized and Descriptions of Associations: Tangential  Orientation:  Other:  Alert and attentive  Thought Content: Endorses auditory hallucinations,  currently does not appear internally preoccupied.  Suicidal Thoughts:  No denies suicidal or self-injurious ideations  Homicidal Thoughts:  No  Memory:  Recent and remote fair  Judgement:  Other:  Limited  Insight:  Limited  Psychomotor Activity: Currently presents calm and in no acute distress/no overt psychomotor restlessness at present  Concentration:  Concentration: Fair and Attention Span: Fair  Recall:  [employer] of Knowledge:  Fair  Language:  Fair  Akathisia:  Negative  Handed:  Right  AIMS (if indicated):     Assets:  Desire for Improvement Resilience  ADL's:  Intact  Cognition:  WNL  Sleep:  Number of Hours: 4.5   Assessment:  49 year old male, admitted under IVC due to medication non compliance, suicidal ideations,hallucinations,  aggressive behaviors/threats . History of chronic mental illness, has been diagnosed with Schizoaffective Disorder in the past .  Staff reports some improvement compared to admission although continues to present with lability, irritability disorganized behaviors requiring frequent redirection.  Continues to experience psychotic symptoms.  He is currently on high-dose Zyprexa (10 mg every morning and 30 mg nightly) and received IM Invega Sustenna on 5/13.  Chart notes indicate clozapine has been considered and that patient did not agree. He did agree to Depakote trial and is now  on Depakote currently at 500 mg twice daily-is not currently endorsing medication side effects.  Treatment Plan Summary: Treatment plan reviewed as below today 5/22. Treatment team working on disposition planning options-[facility] referral.   1.  Continue Xanax 1 mg p.o. daily as needed for anxiety. 2.  Continue Doxepin  25 mg p.o. nightly as needed for insomnia. 3.  Continue Gabapentin  800 mg p.o. 3 times daily for mood stability as well as chronic pain. 4.  Continue Hydrocodone 1 tablet p.o. every 12 hours as needed for pain. 5.  Continue lidocaine  patch 1 patch every 24  hours for chronic pain. 6.  Continue lorazepam 2 mg IM or p.o. every 6 hours as needed severe anxiety or agitation. 7.  Continue Zyprexa 10 mg p.o. daily and 30 mg p.o. nightly for psychosis /mood disorder. 8.  Continue Depakene 500 mg p.o. twice daily for mood disorder 10.Continue Geodon 20 mg IM every 12 hours as needed for acute agitation.  Craige Cotta, MD 01/28/2020, 11:57 AM   Patient ID: [name], male   DOB: [birth year], 49 y.o.   MRN: [id number]

## 2020-01-28 NOTE — Progress Notes (Signed)
   01/28/20 1935  Psych Admission Type (Psych Patients Only)  Admission Status Involuntary  Psychosocial Assessment  Patient Complaints Other (Comment)  Eye Contact Fair  Facial Expression Animated;Anxious  Affect Anxious;Irritable;Labile  Speech Logical/coherent;Loud;Tangential  Interaction Assertive;Attention-seeking;Demanding;Intrusive  Motor Activity Shuffling;Slow;Unsteady  Appearance/Hygiene Unremarkable  Behavior Characteristics Anxious;Fidgety;Pacing;Restless  Mood Anxious;Labile  Thought Process  Coherency WDL  Content Blaming others  Delusions Controlled;Persecutory  Perception WDL  Hallucination None reported or observed  Judgment Limited  Confusion None  Danger to Self  Current suicidal ideation? Denies  Self-Injurious Behavior No self-injurious ideation or behavior indicators observed or expressed   Agreement Not to Harm Self Yes  Description of Agreement Verbally contracts for safety  Danger to Others  Danger to Others None reported or observed

## 2020-01-28 NOTE — Progress Notes (Signed)
D. Pt continues to present as somewhat irritable, but remains cooperative and redirectable.. Pt endorses intermittant AH, but does not appear to be internally preoccupied. Pt currently denies self harm thoughts A. Labs and vitals monitored. Pt compliant with medications. Pt supported emotionally and encouraged to express concerns and ask questions.   R. Pt remains safe with 15 minute checks. Will continue POC.

## 2020-01-29 NOTE — Progress Notes (Signed)
Patient up in his room yelling and cursing about not being able to sleep. He was offered benadryl and doxipen earlier but refused. He later came out and requested it. He is still awake in his room yelling and talking loudly about hsi medicines not being right. Will continue to monitor.

## 2020-01-29 NOTE — Progress Notes (Signed)
   01/29/20 2135  Psych Admission Type (Psych Patients Only)  Admission Status Involuntary  Psychosocial Assessment  Patient Complaints Other (Comment) (pt complains his medications aren't right)  Eye Contact Fair  Facial Expression Animated;Anxious  Affect Anxious;Labile  Speech Logical/coherent;Tangential  Interaction Assertive;Attention-seeking;Demanding;Intrusive  Motor Activity Shuffling  Appearance/Hygiene Unremarkable  Behavior Characteristics Appropriate to situation  Mood Preoccupied  Thought Process  Coherency Concrete thinking  Content Blaming others;Confabulation;Delusions;Obsessions  Delusions Controlled;Persecutory  Perception WDL  Hallucination None reported or observed  Judgment Limited  Confusion None  Danger to Self  Current suicidal ideation? Denies  Self-Injurious Behavior No self-injurious ideation or behavior indicators observed or expressed   Agreement Not to Harm Self Yes  Description of Agreement Verbally contracts for safety  Danger to Others  Danger to Others None reported or observed

## 2020-01-29 NOTE — [facility] Group Notes (Signed)
[place] LCSW Group Therapy Note  Date/Time:  01/29/2020  11:00AM-12:00PM  Type of Therapy and Topic:  Group Therapy:  Music and Mood  Participation Level:  Did Not Attend   Description of Group: In this process group, members listened to a variety of genres of music and identified that different types of music evoke different responses.  Patients were encouraged to identify music that was soothing for them and music that was energizing for them.  Patients discussed how this knowledge can help with wellness and recovery in various ways including managing depression and anxiety as well as encouraging healthy sleep habits.    Therapeutic Goals: Patients will explore the impact of different varieties of music on mood Patients will verbalize the thoughts they have when listening to different types of music Patients will identify music that is soothing to them as well as music that is energizing to them Patients will discuss how to use this knowledge to assist in maintaining wellness and recovery Patients will explore the use of music as a coping skill  Summary of Patient Progress:  N/A  Therapeutic Modalities: Solution Focused Brief Therapy Activity   Selmer Dominion, LCSW

## 2020-01-29 NOTE — Progress Notes (Signed)
Adult Psychoeducational Group Note  Date:  01/29/2020 Time:  9:44 PM  Group Topic/Focus:  Wrap-Up Group:   The focus of this group is to help patients review their daily goal of treatment and discuss progress on daily workbooks.  Participation Level:  Minimal  Participation Quality:  Appropriate  Affect:  Anxious, Excited, Irritable and Labile  Cognitive:  Disorganized, Confused and Delusional  Insight: Lacking and Limited  Engagement in Group:  Lacking, Limited, Off Topic and Poor  Modes of Intervention:  Discussion  Additional Comments: Pt stated his goal for today was to focus on his treatment plan. Pt stated he felt he accomplished his goal today. Pt stated he felt his relationship with his family has improved since he was admitted here. Pt stated been able to contact his mother and sister today improved his day. Pt stated he felt better about himself today. Pt rated his over all day on a 1 out of 10. Writer asked pt what made his day a 1. Pt stated he slept all day long.  Pt stated his appetite was pretty good today. Pt stated his slept last night was poor. Pt nurse was made aware of the situation. Pt stated he was in some physical pain. Writer asked him where was the pain, and to rate it. Pt stated he was having pain in his back. Pt stated his pain level was and 6. Pt nurse was made aware of the situation. Pt deny auditory or visual hallucinations. Pt denies thoughts of harming himself or others. Pt stated that he would alert staff if anything changes.   Candy Sledge 01/29/2020, 9:44 PM

## 2020-01-29 NOTE — Plan of Care (Signed)
Progress note  D: pt found in bed; compliant with medication administration. Pt continues to request pain medication, but when this writer assessed their pain, the pt rated this as a 0. Pt continues to med seek and has times memorized as to when PRN's are due. Pt has been more reclusive this morning and less attention seeking. Pt denies si/hi/ah/vh and verbally agrees to approach staff if these become apparent or before harming themself/others while at [place].  A: Pt provided support and encouragement. Pt given medication per protocol and standing orders. Q58m safety checks implemented and continued.  R: Pt safe on the unit. Will continue to monitor.  Pt progressing in the following metrics  Problem: Education: Goal: Emotional status will improve Outcome: Progressing Goal: Mental status will improve Outcome: Progressing   Problem: Nutritional: Goal: Ability to achieve adequate nutritional intake will improve Outcome: Progressing   Problem: Role Relationship: Goal: Ability to interact with others will improve Outcome: Progressing

## 2020-01-29 NOTE — Progress Notes (Signed)
[facility] MD Progress Note  01/29/2020 11:10 AM [name]  MRN:  [id number]  Subjective:  [name] presents deep asleep in his bed. Apparently, yelled & cursed all night for not being able to sleep.  Objective: 49 year old male, admitted under IVC due to medication non-compliance, suicidal ideations, hallucinations, aggressive behaviors/threats . History of chronic mental illness, has been diagnosed with Schizoaffective Disorder in the past . [name] is seen, chart reviewed. The chart findings discussed with the treatment team. Patient is found in his bed in his room deep asleep. Apparently, he was medicated this morning because he did not sleep well last night per staff reports. Staff reports that patient was up on night yelling & cursing for not being able to sleep. [name] has the tendency to be disruptive on th unit. Reports indicated that he continues to present highly demanding/making disrespectful remarks. He continue to need frequent redirection from staff. Staff has asked to allow patient time to sleep to see if that will help soot his mood. He presents at peace sleeping soundly. There are no signs of distress observed.  Principal Problem: Schizophrenia, paranoid type ([place]) Diagnosis: Principal Problem:   Schizophrenia, paranoid type ([place])  Total Time spent with patient: 15 minutes  Past Psychiatric History: See admission H&P  Past Medical History:  Past Medical History:  Diagnosis Date  . Asthma   . CHF (congestive heart failure) ([place])   . Chronic back pain   . COPD (chronic obstructive pulmonary disease) ([place])   . Knee pain, chronic   . Migraines   . Schizophrenia, schizo-affective ([place])     Past Surgical History:  Procedure Laterality Date  . KNEE SURGERY     four times  . WISDOM TOOTH EXTRACTION     Family History:  Family History  Problem Relation Age of Onset  . Hypertension Father    Family Psychiatric  History: See admission H&P Social History:  Social History    Substance and Sexual Activity  Alcohol Use No   Comment: occ     Social History   Substance and Sexual Activity  Drug Use No   Comment: Pt denied    Social History   Socioeconomic History  . Marital status: Widowed    Spouse name: Not on file  . Number of children: Not on file  . Years of education: Not on file  . Highest education level: Not on file  Occupational History  . Occupation: on disability  Tobacco Use  . Smoking status: Current Every Day Smoker    Packs/day: 1.00    Years: 33.00    Pack years: 33.00    Types: Cigarettes  . Smokeless tobacco: Never Used  Substance and Sexual Activity  . Alcohol use: No    Comment: occ  . Drug use: No    Comment: Pt denied  . Sexual activity: Not Currently  Other Topics Concern  . Not on file  Social History Narrative   Pt is a widower.  He lives alone, and he receives SSI Disability.  Pt was followed by Dr. Darleene Cleaver, but he has not seen Dr. Darleene Cleaver in several months.   Social Determinants of Health   Financial Resource Strain:   . Difficulty of Paying Living Expenses:   Food Insecurity:   . Worried About Charity fundraiser in the Last Year:   . Arboriculturist in the Last Year:   Transportation Needs:   . Film/video editor (Medical):   Marland Kitchen Lack  of Transportation (Non-Medical):   Physical Activity:   . Days of Exercise per Week:   . Minutes of Exercise per Session:   Stress:   . Feeling of Stress :   Social Connections:   . Frequency of Communication with Friends and Family:   . Frequency of Social Gatherings with Friends and Family:   . Attends Religious Services:   . Active Member of Clubs or Organizations:   . Attends Archivist Meetings:   Marland Kitchen Marital Status:    Additional Social History:   Sleep: Poor  Appetite:  Fair  Current Medications: Current Facility-Administered Medications  Medication Dose Route Frequency Provider Last Rate Last Admin  . albuterol (VENTOLIN HFA) 108 (90  Base) MCG/ACT inhaler 2 puff  2 puff Inhalation Q4H PRN Mordecai Maes, NP      . ALPRAZolam [name]) tablet 1 mg  1 mg Oral Daily PRN Sharma Covert, MD   1 mg at 01/29/20 0410  . diphenhydrAMINE (BENADRYL) capsule 50 mg  50 mg Oral Q6H PRN Money, Lowry Ram, FNP   50 mg at 01/29/20 0038   Or  . diphenhydrAMINE (BENADRYL) injection 50 mg  50 mg Intramuscular Q6H PRN Money, Darnelle Maffucci B, FNP      . divalproex (DEPAKOTE) DR tablet 500 mg  500 mg Oral BID Sharma Covert, MD   500 mg at 01/29/20 0735  . doxepin (SINEQUAN) capsule 25 mg  25 mg Oral QHS PRN Sharma Covert, MD   25 mg at 01/29/20 0038  . gabapentin (NEURONTIN) capsule 800 mg  800 mg Oral TID Sharma Covert, MD   800 mg at 01/29/20 0735  . HYDROcodone-acetaminophen (NORCO/VICODIN) 5-325 MG per tablet 1 tablet  1 tablet Oral Q12H PRN Sharma Covert, MD   1 tablet at 01/28/20 1932  . lidocaine (LIDODERM) 5 % 1 patch  1 patch Transdermal Q24H Cobos, Myer Peer, MD   1 patch at 01/28/20 1250  . LORazepam (ATIVAN) tablet 2 mg  2 mg Oral Q6H PRN Money, Lowry Ram, FNP   2 mg at 01/29/20 0735   Or  . LORazepam (ATIVAN) injection 2 mg  2 mg Intramuscular Q6H PRN Money, Lowry Ram, FNP   2 mg at 01/18/20 1535  . magnesium hydroxide (MILK OF MAGNESIA) suspension 30 mL  30 mL Oral Daily PRN Money, Darnelle Maffucci B, FNP   30 mL at 01/26/20 1111  . nicotine polacrilex (NICORETTE) gum 2 mg  2 mg Oral PRN Money, Lowry Ram, FNP   2 mg at 01/22/20 0751  . OLANZapine zydis (ZYPREXA) disintegrating tablet 10 mg  10 mg Oral Daily Sharma Covert, MD   10 mg at 01/29/20 0735  . OLANZapine zydis (ZYPREXA) disintegrating tablet 30 mg  30 mg Oral QHS Sharma Covert, MD   30 mg at 01/28/20 2119  . paliperidone (INVEGA SUSTENNA) injection 234 mg  234 mg Intramuscular Q28 days Money, Lowry Ram, [place]   234 mg at 01/19/20 0910  . ziprasidone (GEODON) injection 20 mg  20 mg Intramuscular Q12H PRN Money, Lowry Ram, FNP   20 mg at 01/28/20 2211   Lab Results: No  results found for this or any previous visit (from the past 48 hour(s)).  Blood Alcohol level:  Lab Results  Component Value Date   [facility] <10 01/15/2020   ETH <10 [id number]   Metabolic Disorder Labs: Lab Results  Component Value Date   HGBA1C 5.6 01/17/2020   MPG 114.02 01/17/2020  MPG 111.15 06/25/2017   Lab Results  Component Value Date   PROLACTIN 16.1 (H) 01/17/2020   PROLACTIN 42.9 (H) 08/19/2017   Lab Results  Component Value Date   CHOL 154 01/17/2020   TRIG 186 (H) 01/17/2020   HDL 33 (L) 01/17/2020   CHOLHDL 4.7 01/17/2020   VLDL 37 01/17/2020   LDLCALC 84 01/17/2020   LDLCALC 126 (H) 06/25/2017   Physical Findings: AIMS:  , ,  ,  ,    CIWA:    COWS:     Musculoskeletal: Strength & Muscle Tone: within normal limits Gait & Station: shuffle Patient leans: N/A  Psychiatric Specialty Exam: Physical Exam  Nursing note and vitals reviewed. Constitutional: He is oriented to person, place, and time. He appears well-developed and well-nourished.  HENT:  Head: Normocephalic and atraumatic.  Respiratory: Effort normal.  Genitourinary:    Genitourinary Comments: Deferred   Musculoskeletal:        General: Normal range of motion.     Cervical back: Normal range of motion.  Neurological: He is alert and oriented to person, place, and time.  Skin: Skin is warm.    Review of Systems  Constitutional: Negative for chills, diaphoresis and fever.  HENT: Negative for congestion, rhinorrhea, sneezing and sore throat.   Respiratory: Negative for cough, chest tightness and shortness of breath.   Cardiovascular: Negative for chest pain and palpitations.  Gastrointestinal: Negative for diarrhea, nausea and vomiting.  Endocrine: Negative for cold intolerance.  Genitourinary: Negative for difficulty urinating.  Musculoskeletal: Negative.   Skin: Negative.   Allergic/Immunologic: Negative for environmental allergies and food allergies.       Allergies:  Dextromethorphan-guaifenesin.  Haloperidol   Haloperidol Lactate    Meloxicam    Nsaids   Quetiapine   Sudafed (Pseudoephedrine Hcl)   Ziprasidone    Prednisone           Neurological: Negative for dizziness, tremors, seizures, syncope, facial asymmetry, speech difficulty, weakness, light-headedness, numbness and headaches.  Psychiatric/Behavioral: Positive for agitation ( Staff reports, patient did not sleep well last nighjt. ), decreased concentration (Staff reports, patient did not sleep well last nighjt. ), dysphoric mood, hallucinations and sleep disturbance. Negative for behavioral problems, confusion, self-injury and suicidal ideas. The patient is nervous/anxious and is hyperactive (Restless).    reports back pain, no chest pain, no shortness of breath at room air, no vomiting  Blood pressure 120/90, pulse 88, temperature 98.8 F (37.1 C), temperature source Oral, resp. rate 18, height 6\' 1"  (1.854 m), weight 85.7 kg, SpO2 96 %.Body mass index is 24.94 kg/m.  General Appearance: Casual  Eye Contact:  Fair  Speech:  Pressured and rambling at times  Volume:  Normal  Mood:  Reports mood is "average"  Affect:  Remains labile and irritable at times  Thought Process:  Disorganized and Descriptions of Associations: Tangential  Orientation:  Other:  Alert and attentive  Thought Content: Endorses auditory hallucinations, currently does not appear internally preoccupied.  Suicidal Thoughts:  No denies suicidal or self-injurious ideations  Homicidal Thoughts:  No  Memory:  Recent and remote fair  Judgement:  Other:  Limited  Insight:  Limited  Psychomotor Activity: Currently presents calm and in no acute distress/no overt psychomotor restlessness at present  Concentration:  Concentration: Fair and Attention Span: Fair  Recall:  [employer] of Knowledge:  Fair  Language:  Fair  Akathisia:  Negative  Handed:  Right  AIMS (if indicated):     Assets:  Desire  for Improvement Resilience   ADL's:  Intact  Cognition:  WNL  Sleep:  Number of Hours: 1.75   Assessment:  49 year old male, admitted under IVC due to medication non compliance, suicidal ideations, hallucinations, aggressive behaviors/threats. History of chronic mental illness, has been diagnosed with Schizoaffective Disorder in the past .  Staff reports patient continues to present with lability, irritability disorganized behaviors requiring frequent redirection.  Continues to experience psychotic symptoms.  He is currently on high-dose Zyprexa (10 mg every morning and 30 mg nightly) and received IM Invega Sustenna on 01/19/20.  Chart notes indicate clozapine has been considered and that patient did not agree. He did agree to Depakote trial and is now  on Depakote currently at 500 mg twice daily-is not currently endorsing medication side effects.  Treatment Plan Summary: - Continue inpatient hospitalization. - Will continue today 01/29/2020 plan as below except where it is noted.  Treatment team working on disposition planning options-[facility] referral.   1.  Continue Xanax 1 mg p.o. daily as needed for anxiety. 2.  Continue Doxepin  25 mg p.o. nightly as needed for insomnia. 3.  Continue Gabapentin  800 mg p.o. 3 times daily for mood stability as well as chronic pain. 4.  Continue Hydrocodone 1 tablet p.o. every 12 hours as needed for pain. 5.  Continue lidocaine patch 1 patch every 24 hours for chronic pain. 6.  Continue lorazepam 2 mg IM or p.o. every 6 hours as needed severe anxiety or agitation. 7.  Continue Zyprexa 10 mg p.o. daily and 30 mg p.o. nightly for psychosis /mood disorder. 8.  Continue Depakene 500 mg p.o. twice daily for mood disorder 10. Continue Geodon 20 mg IM every 12 hours as needed for acute agitation.  Lindell Spar, NP, PMHNP, FNP-BC. 01/29/2020, 11:10 AM   Patient ID: [name], male   DOB: 09-09-1970, 49 y.o.   MRN: [id number] Patient ID: [name], male   DOB: 12-Jun-1971, 49 y.o.   MRN:  [id number]

## 2020-01-29 NOTE — Progress Notes (Signed)
   01/29/20 2135  COVID-19 Daily Checkoff  Have you had a fever (temp > 37.80C/100F)  in the past 24 hours?  No  If you have had runny nose, nasal congestion, sneezing in the past 24 hours, has it worsened? No  COVID-19 EXPOSURE  Have you traveled outside the state in the past 14 days? No  Have you been in contact with someone with a confirmed diagnosis of COVID-19 or PUI in the past 14 days without wearing appropriate PPE? No  Have you been living in the same home as a person with confirmed diagnosis of COVID-19 or a PUI (household contact)? No  Have you been diagnosed with COVID-19? No

## 2020-01-30 MED ORDER — DIVALPROEX SODIUM 500 MG PO DR TAB
500.0000 mg | DELAYED_RELEASE_TABLET | Freq: Every day | ORAL | Status: DC
  Administered 2020-01-31: 500 mg via ORAL
  Filled 2020-01-30 (×3): qty 1

## 2020-01-30 MED ORDER — DIVALPROEX SODIUM 250 MG PO DR TAB
750.0000 mg | DELAYED_RELEASE_TABLET | Freq: Every evening | ORAL | Status: DC
  Administered 2020-01-30: 750 mg via ORAL
  Filled 2020-01-30 (×3): qty 3

## 2020-01-30 MED ORDER — GABAPENTIN 300 MG PO CAPS
600.0000 mg | ORAL_CAPSULE | Freq: Three times a day (TID) | ORAL | Status: DC
  Administered 2020-01-30 – 2020-01-31 (×3): 600 mg via ORAL
  Filled 2020-01-30 (×9): qty 2

## 2020-01-30 MED ORDER — OLANZAPINE 10 MG PO TBDP
20.0000 mg | ORAL_TABLET | Freq: Every day | ORAL | Status: DC
  Administered 2020-01-30: 20 mg via ORAL
  Filled 2020-01-30 (×3): qty 2

## 2020-01-30 MED ORDER — DIVALPROEX SODIUM 500 MG PO DR TAB
750.0000 mg | DELAYED_RELEASE_TABLET | Freq: Every evening | ORAL | Status: DC
  Filled 2020-01-30: qty 1

## 2020-01-30 NOTE — BHH Counselor (Signed)
CSW spoke with [facility] Admissions nurse, Amor. Patient has been accepted, but there is not a bed at this time.  CSW continuing to follow.  Stephanie Acre, MSW, LCSW-A Clinical Social Worker [facility] Adult Unit

## 2020-01-30 NOTE — Progress Notes (Signed)
[facility] MD Progress Note  01/30/2020 1:17 PM [name]  MRN:  [id number] Patient is a 49 year old male with a past psychiatric history significant for schizophrenia who was admitted on 5/10 after worsening aggressive behavior, contemplating suicide, and threatening his family. His father had reported that he flushed his medications several times prior to admission.  Objective: Patient is seen and examined.  Patient is a 49 year old male with the above-stated past psychiatric history who is seen in follow-up.  Patient is improved since last night falling on 5/21.  His irritability has decreased a bit, and he is a little bit less intrusive.  He still not sleeping well.  He remains focused on his Xanax and hydrocodone.  He remains paranoid and believes that people are manipulating his medicines.  He was changed to Depakote from Depakene over the weekend.  His current medications include Xanax, Depakote, Sinequan, Neurontin and Zyprexa.  Review of laboratories showed no new laboratories since 5/11.  His vital signs are stable, he is afebrile.  His sleep is still poor at 4.5 hours.  Subjective:   Principal Problem: Schizophrenia, paranoid type ([place]) Diagnosis: Principal Problem:   Schizophrenia, paranoid type ([place])  Total Time spent with patient: 20 minutes  Past Psychiatric History: See admission H&P  Past Medical History:  Past Medical History:  Diagnosis Date  . Asthma   . CHF (congestive heart failure) ([place])   . Chronic back pain   . COPD (chronic obstructive pulmonary disease) ([place])   . Knee pain, chronic   . Migraines   . Schizophrenia, schizo-affective ([place])     Past Surgical History:  Procedure Laterality Date  . KNEE SURGERY     four times  . WISDOM TOOTH EXTRACTION     Family History:  Family History  Problem Relation Age of Onset  . Hypertension Father    Family Psychiatric  History: See admission H&P Social History:  Social History   Substance and Sexual Activity   Alcohol Use No   Comment: occ     Social History   Substance and Sexual Activity  Drug Use No   Comment: Pt denied    Social History   Socioeconomic History  . Marital status: Widowed    Spouse name: Not on file  . Number of children: Not on file  . Years of education: Not on file  . Highest education level: Not on file  Occupational History  . Occupation: on disability  Tobacco Use  . Smoking status: Current Every Day Smoker    Packs/day: 1.00    Years: 33.00    Pack years: 33.00    Types: Cigarettes  . Smokeless tobacco: Never Used  Substance and Sexual Activity  . Alcohol use: No    Comment: occ  . Drug use: No    Comment: Pt denied  . Sexual activity: Not Currently  Other Topics Concern  . Not on file  Social History Narrative   Pt is a widower.  He lives alone, and he receives SSI Disability.  Pt was followed by Dr. Darleene Cleaver, but he has not seen Dr. Darleene Cleaver in several months.   Social Determinants of Health   Financial Resource Strain:   . Difficulty of Paying Living Expenses:   Food Insecurity:   . Worried About Charity fundraiser in the Last Year:   . Arboriculturist in the Last Year:   Transportation Needs:   . Film/video editor (Medical):   Marland Kitchen Lack of Transportation (Non-Medical):  Physical Activity:   . Days of Exercise per Week:   . Minutes of Exercise per Session:   Stress:   . Feeling of Stress :   Social Connections:   . Frequency of Communication with Friends and Family:   . Frequency of Social Gatherings with Friends and Family:   . Attends Religious Services:   . Active Member of Clubs or Organizations:   . Attends Archivist Meetings:   Marland Kitchen Marital Status:    Additional Social History:                         Sleep: Fair  Appetite:  Fair  Current Medications: Current Facility-Administered Medications  Medication Dose Route Frequency Provider Last Rate Last Admin  . albuterol (VENTOLIN HFA) 108 (90 Base)  MCG/ACT inhaler 2 puff  2 puff Inhalation Q4H PRN Mordecai Maes, NP      . ALPRAZolam [name]) tablet 1 mg  1 mg Oral Daily PRN Sharma Covert, MD   1 mg at 01/29/20 0410  . diphenhydrAMINE (BENADRYL) capsule 50 mg  50 mg Oral Q6H PRN Money, Lowry Ram, FNP   50 mg at 01/29/20 2132   Or  . diphenhydrAMINE (BENADRYL) injection 50 mg  50 mg Intramuscular Q6H PRN Money, Lowry Ram, FNP      . [START ON 01/31/2020] divalproex (DEPAKOTE) DR tablet 500 mg  500 mg Oral Daily Sharma Covert, MD      . divalproex (DEPAKOTE) DR tablet 750 mg  750 mg Oral QPM Sharma Covert, MD      . doxepin [facility]) capsule 25 mg  25 mg Oral QHS PRN Sharma Covert, MD   25 mg at 01/29/20 2132  . gabapentin (NEURONTIN) capsule 600 mg  600 mg Oral TID Sharma Covert, MD   600 mg at 01/30/20 1201  . HYDROcodone-acetaminophen (NORCO/VICODIN) 5-325 MG per tablet 1 tablet  1 tablet Oral Q12H PRN Sharma Covert, MD   1 tablet at 01/30/20 [phone]  . lidocaine (LIDODERM) 5 % 1 patch  1 patch Transdermal Q24H Cobos, Myer Peer, MD   1 patch at 01/30/20 1156  . LORazepam (ATIVAN) tablet 2 mg  2 mg Oral Q6H PRN Money, Lowry Ram, FNP   2 mg at 01/30/20 [id number]   Or  . LORazepam (ATIVAN) injection 2 mg  2 mg Intramuscular Q6H PRN Money, Lowry Ram, FNP   2 mg at 01/18/20 1535  . magnesium hydroxide (MILK OF MAGNESIA) suspension 30 mL  30 mL Oral Daily PRN Money, Darnelle Maffucci B, FNP   30 mL at 01/26/20 1111  . nicotine polacrilex (NICORETTE) gum 2 mg  2 mg Oral PRN Money, Lowry Ram, FNP   2 mg at 01/22/20 0751  . OLANZapine zydis (ZYPREXA) disintegrating tablet 10 mg  10 mg Oral Daily Sharma Covert, MD   10 mg at 01/30/20 0753  . OLANZapine zydis (ZYPREXA) disintegrating tablet 30 mg  30 mg Oral QHS Sharma Covert, MD   30 mg at 01/29/20 2228  . paliperidone (INVEGA SUSTENNA) injection 234 mg  234 mg Intramuscular Q28 days Money, Lowry Ram,    234 mg at 01/19/20 0910  . ziprasidone (GEODON) injection 20 mg  20 mg  Intramuscular Q12H PRN Money, Lowry Ram, FNP   20 mg at 01/28/20 2211    Lab Results: No results found for this or any previous visit (from the past 48 hour(s)).  Blood Alcohol level:  Lab Results  Component Value Date   ETH <10 01/15/2020   ETH <10 [id number]    Metabolic Disorder Labs: Lab Results  Component Value Date   HGBA1C 5.6 01/17/2020   MPG 114.02 01/17/2020   MPG 111.15 06/25/2017   Lab Results  Component Value Date   PROLACTIN 16.1 (H) 01/17/2020   PROLACTIN 42.9 (H) 08/19/2017   Lab Results  Component Value Date   CHOL 154 01/17/2020   TRIG 186 (H) 01/17/2020   HDL 33 (L) 01/17/2020   CHOLHDL 4.7 01/17/2020   VLDL 37 01/17/2020   LDLCALC 84 01/17/2020   LDLCALC 126 (H) 06/25/2017    Physical Findings: AIMS:  , ,  ,  ,    CIWA:    COWS:     Musculoskeletal: Strength & Muscle Tone: within normal limits Gait & Station: normal Patient leans: N/A  Psychiatric Specialty Exam: Physical Exam  Nursing note and vitals reviewed. Constitutional: He is oriented to person, place, and time. He appears well-developed and well-nourished.  HENT:  Head: Normocephalic and atraumatic.  Respiratory: Effort normal.  Neurological: He is alert and oriented to person, place, and time.    Review of Systems  Blood pressure 122/89, pulse 87, temperature 97.7 F (36.5 C), temperature source Oral, resp. rate 18, height 6\' 1"  (1.854 m), weight 85.7 kg, SpO2 96 %.Body mass index is 24.94 kg/m.  General Appearance: Casual  Eye Contact:  Minimal  Speech:  Normal Rate  Volume:  Normal  Mood:  Anxious, Dysphoric and Irritable  Affect:  Congruent  Thought Process:  Coherent and Descriptions of Associations: Loose  Orientation:  Full (Time, Place, and Person)  Thought Content:  Delusions, Paranoid Ideation and Rumination  Suicidal Thoughts:  No  Homicidal Thoughts:  No  Memory:  Immediate;   Fair Recent;   Fair Remote;   Fair  Judgement:  Impaired  Insight:  Lacking   Psychomotor Activity:  Normal  Concentration:  Concentration: Fair and Attention Span: Fair  Recall:  [employer] of Knowledge:  Fair  Language:  Good  Akathisia:  Negative  Handed:  Right  AIMS (if indicated):     Assets:  Desire for Improvement Resilience  ADL's:  Intact  Cognition:  WNL  Sleep:  Number of Hours: 4.5     Treatment Plan Summary: Daily contact with patient to assess and evaluate symptoms and progress in treatment, Medication management and Plan : Patient is seen and examined.  Patient is a 49 year old male with the above-stated past psychiatric history who is seen in follow-up.   Diagnosis: #1 schizophrenia versus schizoaffective disorder; bipolar type, #2 migraine headaches, #3 anxiety, #4 hyperlipidemia  Patient is seen in follow-up.  He is little bit better today.  He still remains paranoid.  He is still not sleeping.  His speech is a little bit impaired from oversedation.  But I will begin to decrease his gabapentin, and increase his Depakote.  We will decrease his gabapentin to 600 mg p.o. 3 times daily, and increase his Depakote to 500 mg p.o. daily and 750 mg p.o. every afternoon.  No change in his Zyprexa at this point, but hopefully if the Depakote continues to decrease his irritability and help him sleep we will be able to decrease that dosage.  We will continue with the [facility]  No other changes in his medicines at this time.  1.  Continue Xanax 1 mg p.o. daily as needed anxiety. 2.  Increase  Depakote ER to 500 mg p.o. daily and 750 mg p.o. every afternoon for mood stability. 3.  Stop Sinequan. 4.  Decrease gabapentin to 600 mg p.o. 3 times daily for mood stability of chronic pain. 5.  Continue hydrocodone 1 tablet p.o. every 12 hours as needed pain. 6.  Continue lidocaine patch 1 patch administered over 12 hours every 24 hours for pain. 7.  Continue lorazepam 2 mg p.o. or IM every 6 hours as needed severe anxiety  or agitation. 8.  Decrease Zyprexa to 10 mg p.o. daily and 20 mg p.o. nightly for psychosis. 9.  Patient received the long-acting paliperidone injection on 01/19/2020. 10.  Continue Geodon 20 mg IM every 12 hours as needed agitation. 11.  Disposition planning-in progress, but [facility] is still on the table. Sharma Covert, MD 01/30/2020, 1:17 PM

## 2020-01-30 NOTE — Progress Notes (Signed)
Recreation Therapy Notes  Date: 5.24.21 Time: 1000 Location: [address] Dayroom  Group Topic: Coping Skills  Goal Area(s) Addresses:  Patient will identify positive coping skills. Patient will identify benefit of using coping skills post d/c.  Behavioral Response: Engaged  Intervention: Worksheet, pencils  Activity: Coping A to Z.  Patients were to identify a positive coping skill for each letter of the alphabet.  Patients would then share their top 5 coping skills with the group.  Education: Radiographer, therapeutic, Dentist.   Education Outcome: Acknowledges understanding/In group clarification offered/Needs additional education.   Clinical Observations/Feedback:  Pt was active and engaged during group session.  Pt identified some of his coping skills as asking questions, believe only good things, capitalize on the positive, do all you can to be the best you can be, exemplify being well and have fun.    Victorino Sparrow, LRT/CTRS    Victorino Sparrow A 01/30/2020 11:23 AM

## 2020-01-30 NOTE — BHH Group Notes (Signed)
LCSW Group Therapy Notes 01/30/2020 2:24 PM  Type of Therapy and Topic: Group Therapy: Overcoming Obstacles  Participation Level: None  Description of Group:  In this group patients will be encouraged to explore what they see as obstacles to their own wellness and recovery. They will be guided to discuss their thoughts, feelings, and behaviors related to these obstacles. The group will process together ways to cope with barriers, with attention given to specific choices patients can make. Each patient will be challenged to identify changes they are motivated to make in order to overcome their obstacles. This group will be process-oriented, with patients participating in exploration of their own experiences as well as giving and receiving support and challenge from other group members.  Therapeutic Goals: 1. Patient will identify personal and current obstacles as they relate to admission. 2. Patient will identify barriers that currently interfere with their wellness or overcoming obstacles.  3. Patient will identify feelings, thought process and behaviors related to these barriers. 4. Patient will identify two changes they are willing to make to overcome these obstacles:   Summary of Patient Progress Remained in the dayroom, fell asleep.  Therapeutic Modalities:  Cognitive Behavioral Therapy Solution Focused Therapy Motivational Interviewing Relapse Prevention Therapy  Stephanie Acre, MSW, [facility] 01/30/2020 2:24 PM

## 2020-01-30 NOTE — Progress Notes (Signed)
   01/30/20 0900  Psych Admission Type (Psych Patients Only)  Admission Status Involuntary  Psychosocial Assessment  Patient Complaints Anxiety  Eye Contact Fair  Facial Expression Animated;Anxious  Affect Anxious;Labile  Speech Logical/coherent;Tangential  Interaction Assertive;Attention-seeking;Demanding;Intrusive  Motor Activity Shuffling  Appearance/Hygiene Unremarkable  Behavior Characteristics Anxious  Mood Labile;Preoccupied  Thought Process  Coherency Concrete thinking  Content Blaming others;Confabulation;Delusions;Obsessions  Delusions Controlled;Persecutory  Perception WDL  Hallucination None reported or observed  Judgment Limited  Confusion None  Danger to Self  Current suicidal ideation? Denies  Self-Injurious Behavior No self-injurious ideation or behavior indicators observed or expressed   Agreement Not to Harm Self Yes  Description of Agreement Verbally contracts for safety  Danger to Others  Danger to Others None reported or observed

## 2020-01-30 NOTE — Progress Notes (Signed)
Pt appeared less irritable this evening, pt visible in dayroom interacting appropriately with peers    01/30/20 2000  Psych Admission Type (Psych Patients Only)  Admission Status Involuntary  Psychosocial Assessment  Patient Complaints Anxiety  Eye Contact Fair  Facial Expression Animated;Anxious  Affect Anxious;Labile  Speech Logical/coherent;Tangential  Interaction Assertive;Attention-seeking;Demanding;Intrusive  Motor Activity Shuffling  Appearance/Hygiene Unremarkable  Behavior Characteristics Anxious  Mood Labile;Suspicious  Thought Process  Coherency Concrete thinking  Content Blaming others;Confabulation;Delusions;Obsessions  Delusions Controlled;Persecutory  Perception WDL  Hallucination None reported or observed  Judgment Limited  Confusion None  Danger to Self  Current suicidal ideation? Denies  Self-Injurious Behavior No self-injurious ideation or behavior indicators observed or expressed   Agreement Not to Harm Self Yes  Description of Agreement Verbally contracts for safety  Danger to Others  Danger to Others None reported or observed

## 2020-01-30 NOTE — BHH Counselor (Signed)
CSW received a call from [place]. If patient is not discharged by tomorrow morning, a court letter will be required for the continuation of patient's IVC.  Stephanie Acre, MSW, LCSW-A Clinical Social Worker [facility]

## 2020-01-31 LAB — COMPREHENSIVE METABOLIC PANEL
ALT: 33 U/L (ref 0–44)
AST: 21 U/L (ref 15–41)
Albumin: 3.9 g/dL (ref 3.5–5.0)
Alkaline Phosphatase: 52 U/L (ref 38–126)
Anion gap: 10 (ref 5–15)
BUN: 12 mg/dL (ref 6–20)
CO2: 26 mmol/L (ref 22–32)
Calcium: 9.6 mg/dL (ref 8.9–10.3)
Chloride: 105 mmol/L (ref 98–111)
Creatinine, Ser: 0.68 mg/dL (ref 0.61–1.24)
GFR calc Af Amer: >60 mL/min (ref >60)
GFR calc non Af Amer: >60 mL/min (ref >60)
Glucose, Bld: 95 mg/dL (ref 70–99)
Potassium: 4.1 mmol/L (ref 3.5–5.1)
Sodium: 141 mmol/L (ref 135–145)
Total Bilirubin: 0.6 mg/dL (ref 0.3–1.2)
Total Protein: 6.9 g/dL (ref 6.5–8.1)

## 2020-01-31 LAB — CBC WITH DIFFERENTIAL/PLATELET
Abs Immature Granulocytes: 0.06 10*3/uL (ref 0.00–0.07)
Basophils Absolute: 0.1 10*3/uL (ref 0.0–0.1)
Basophils Relative: 1 %
Eosinophils Absolute: 0.1 10*3/uL (ref 0.0–0.5)
Eosinophils Relative: 1 %
HCT: 49.2 % (ref 39.0–52.0)
Hemoglobin: 15.8 g/dL (ref 13.0–17.0)
Immature Granulocytes: 1 %
Lymphocytes Relative: 34 %
Lymphs Abs: 2.6 10*3/uL (ref 0.7–4.0)
MCH: 29.4 pg (ref 26.0–34.0)
MCHC: 32.1 g/dL (ref 30.0–36.0)
MCV: 91.6 fL (ref 80.0–100.0)
Monocytes Absolute: 0.7 10*3/uL (ref 0.1–1.0)
Monocytes Relative: 9 %
Neutro Abs: 4.3 10*3/uL (ref 1.7–7.7)
Neutrophils Relative %: 54 %
Platelets: 209 10*3/uL (ref 150–400)
RBC: 5.37 MIL/uL (ref 4.22–5.81)
RDW: 13.2 % (ref 11.5–15.5)
WBC: 7.8 10*3/uL (ref 4.0–10.5)
nRBC: 0 % (ref 0.0–0.2)

## 2020-01-31 LAB — VALPROIC ACID LEVEL: Valproic Acid Lvl: 43 ug/mL — ABNORMAL LOW (ref 50.0–100.0)

## 2020-01-31 MED ORDER — OLANZAPINE 10 MG PO TBDP
10.0000 mg | ORAL_TABLET | Freq: Two times a day (BID) | ORAL | Status: DC
  Administered 2020-01-31 – 2020-02-01 (×2): 10 mg via ORAL
  Filled 2020-01-31 (×4): qty 1

## 2020-01-31 MED ORDER — GABAPENTIN 400 MG PO CAPS
400.0000 mg | ORAL_CAPSULE | Freq: Three times a day (TID) | ORAL | Status: DC
  Administered 2020-01-31 – 2020-02-02 (×6): 400 mg via ORAL
  Filled 2020-01-31: qty 4
  Filled 2020-01-31 (×11): qty 1

## 2020-01-31 MED ORDER — DIVALPROEX SODIUM 250 MG PO DR TAB
750.0000 mg | DELAYED_RELEASE_TABLET | Freq: Two times a day (BID) | ORAL | Status: DC
  Administered 2020-01-31 – 2020-02-01 (×2): 750 mg via ORAL
  Filled 2020-01-31 (×4): qty 3

## 2020-01-31 NOTE — Progress Notes (Signed)
Pt coming out his room at [facility], pt stated he fell. Pt stated he hit his R-elbow, R-knee. Pt stated he did not hit his head. Pt stated he woke up and ran into the door . Vitals: 148/98 , P-84, T 98.6, RR-18. Corene Cornea -NP notified and stated continue to monitor. Pt stated he was ok, pt had no new complaint of pain at this time. Pt did not want staff to contact family at this time. Pt continued to ask for Xanax, pt was informed that that would not be a good idea at this time. Pt went back into his room and went to sleep

## 2020-01-31 NOTE — Progress Notes (Signed)
[facility] MD Progress Note  01/31/2020 11:06 AM [name]  MRN:  [id number] Subjective:  Patient is a 49 year old male with a past psychiatric history significant for schizophrenia who was admitted on 5/10 after worsening aggressive behavior, contemplating suicide, and threatening his family. His father had reported that he flushed his medications several times prior to admission.  [name] seen sitting in his room. He appears sedated but irritable on assessment. He remains focused on benzodiazepines, repeatedly asking for Xanax. He states he is "agitated and having a panic attack." Patient was informed this would not be given due to current sedation.   Per nursing report, patient had a fall overnight. Patient states he was having a bad dream, got out of bed, and ran into the wall, hitting his right knee and right elbow. He denies any pain at this time. No swelling or erythema observed. ROM intact. He denies hitting his head. He reports sleeping last night "like the dead," but only 4.5 hours of sleep are recorded. He is fiddling with multiple papers with handwriting, stating he has "multiple bills to pay, and I need to get out of here." He remains irritable regarding medications and requesting benzodiazepines. Denies SI/HI/AVH.  Labs reviewed. CMP, CBC diff WNL. VPA level 43.  Principal Problem: Schizophrenia, paranoid type ([place]) Diagnosis: Principal Problem:   Schizophrenia, paranoid type ([place])  Total Time spent with patient: 20 minutes  Past Psychiatric History: See admission H&P  Past Medical History:  Past Medical History:  Diagnosis Date  . Asthma   . CHF (congestive heart failure) ([place])   . Chronic back pain   . COPD (chronic obstructive pulmonary disease) ([place])   . Knee pain, chronic   . Migraines   . Schizophrenia, schizo-affective ([place])     Past Surgical History:  Procedure Laterality Date  . KNEE SURGERY     four times  . WISDOM TOOTH EXTRACTION     Family History:  Family  History  Problem Relation Age of Onset  . Hypertension Father    Family Psychiatric  History: See admission H&P Social History:  Social History   Substance and Sexual Activity  Alcohol Use No   Comment: occ     Social History   Substance and Sexual Activity  Drug Use No   Comment: Pt denied    Social History   Socioeconomic History  . Marital status: Widowed    Spouse name: Not on file  . Number of children: Not on file  . Years of education: Not on file  . Highest education level: Not on file  Occupational History  . Occupation: on disability  Tobacco Use  . Smoking status: Current Every Day Smoker    Packs/day: 1.00    Years: 33.00    Pack years: 33.00    Types: Cigarettes  . Smokeless tobacco: Never Used  Substance and Sexual Activity  . Alcohol use: No    Comment: occ  . Drug use: No    Comment: Pt denied  . Sexual activity: Not Currently  Other Topics Concern  . Not on file  Social History Narrative   Pt is a widower.  He lives alone, and he receives SSI Disability.  Pt was followed by Dr. Darleene Cleaver, but he has not seen Dr. Darleene Cleaver in several months.   Social Determinants of Health   Financial Resource Strain:   . Difficulty of Paying Living Expenses:   Food Insecurity:   . Worried About Charity fundraiser in the  Last Year:   . [place] in the Last Year:   Transportation Needs:   . Film/video editor (Medical):   Marland Kitchen Lack of Transportation (Non-Medical):   Physical Activity:   . Days of Exercise per Week:   . Minutes of Exercise per Session:   Stress:   . Feeling of Stress :   Social Connections:   . Frequency of Communication with Friends and Family:   . Frequency of Social Gatherings with Friends and Family:   . Attends Religious Services:   . Active Member of Clubs or Organizations:   . Attends Archivist Meetings:   Marland Kitchen Marital Status:    Additional Social History:                         Sleep:  Fair  Appetite:  Good  Current Medications: Current Facility-Administered Medications  Medication Dose Route Frequency Provider Last Rate Last Admin  . albuterol (VENTOLIN HFA) 108 (90 Base) MCG/ACT inhaler 2 puff  2 puff Inhalation Q4H PRN Mordecai Maes, NP      . ALPRAZolam [name]) tablet 1 mg  1 mg Oral Daily PRN Sharma Covert, MD   1 mg at 01/29/20 0410  . diphenhydrAMINE (BENADRYL) capsule 50 mg  50 mg Oral Q6H PRN Money, Lowry Ram, FNP   50 mg at 01/30/20 2100   Or  . diphenhydrAMINE (BENADRYL) injection 50 mg  50 mg Intramuscular Q6H PRN Money, Darnelle Maffucci B, FNP      . divalproex (DEPAKOTE) DR tablet 500 mg  500 mg Oral Daily Sharma Covert, MD   500 mg at 01/31/20 0739  . divalproex (DEPAKOTE) DR tablet 750 mg  750 mg Oral QPM Sharma Covert, MD   750 mg at 01/30/20 1752  . doxepin (SINEQUAN) capsule 25 mg  25 mg Oral QHS PRN Sharma Covert, MD   25 mg at 01/30/20 2058  . gabapentin (NEURONTIN) capsule 600 mg  600 mg Oral TID Sharma Covert, MD   600 mg at 01/31/20 0739  . HYDROcodone-acetaminophen (NORCO/VICODIN) 5-325 MG per tablet 1 tablet  1 tablet Oral Q12H PRN Sharma Covert, MD   1 tablet at 01/30/20 1753  . lidocaine (LIDODERM) 5 % 1 patch  1 patch Transdermal Q24H Cobos, Myer Peer, MD   1 patch at 01/31/20 1031  . LORazepam (ATIVAN) tablet 2 mg  2 mg Oral Q6H PRN Money, Lowry Ram, FNP   2 mg at 01/31/20 1009   Or  . LORazepam (ATIVAN) injection 2 mg  2 mg Intramuscular Q6H PRN Money, Lowry Ram, FNP   2 mg at 01/18/20 1535  . magnesium hydroxide (MILK OF MAGNESIA) suspension 30 mL  30 mL Oral Daily PRN Money, Darnelle Maffucci B, FNP   30 mL at 01/26/20 1111  . nicotine polacrilex (NICORETTE) gum 2 mg  2 mg Oral PRN Money, Lowry Ram, FNP   2 mg at 01/22/20 0751  . OLANZapine zydis (ZYPREXA) disintegrating tablet 10 mg  10 mg Oral Daily Sharma Covert, MD   10 mg at 01/31/20 0739  . OLANZapine zydis (ZYPREXA) disintegrating tablet 20 mg  20 mg Oral QHS Sharma Covert, MD   20 mg at 01/30/20 2058  . paliperidone (INVEGA SUSTENNA) injection 234 mg  234 mg Intramuscular Q28 days Money, Lowry Ram, [place]   234 mg at 01/19/20 0910  . ziprasidone (GEODON) injection 20 mg  20 mg Intramuscular Q12H  PRN Money, Lowry Ram, FNP   20 mg at 01/28/20 2211    Lab Results:  Results for orders placed or performed during the hospital encounter of 01/16/20 (from the past 48 hour(s))  Valproic acid level     Status: Abnormal   Collection Time: 01/31/20  6:50 AM  Result Value Ref Range   Valproic Acid Lvl 43 (L) 50.0 - 100.0 ug/mL    Comment: Performed at [facility], [place] [address], [place], [place] [address]  CBC with Differential/Platelet     Status: None   Collection Time: 01/31/20  6:50 AM  Result Value Ref Range   WBC 7.8 4.0 - 10.5 K/uL   RBC 5.37 4.22 - 5.81 MIL/uL   Hemoglobin 15.8 13.0 - 17.0 g/dL   HCT 49.2 39.0 - 52.0 %   MCV 91.6 80.0 - 100.0 fL   MCH 29.4 26.0 - 34.0 pg   MCHC 32.1 30.0 - 36.0 g/dL   RDW 13.2 11.5 - 15.5 %   Platelets 209 150 - 400 K/uL   nRBC 0.0 0.0 - 0.2 %   Neutrophils Relative % 54 %   Neutro Abs 4.3 1.7 - 7.7 K/uL   Lymphocytes Relative 34 %   Lymphs Abs 2.6 0.7 - 4.0 K/uL   Monocytes Relative 9 %   Monocytes Absolute 0.7 0.1 - 1.0 K/uL   Eosinophils Relative 1 %   Eosinophils Absolute 0.1 0.0 - 0.5 K/uL   Basophils Relative 1 %   Basophils Absolute 0.1 0.0 - 0.1 K/uL   Immature Granulocytes 1 %   Abs Immature Granulocytes 0.06 0.00 - 0.07 K/uL    Comment: Performed at [facility], [place] [address], [place], [place] [address]  Comprehensive metabolic panel     Status: None   Collection Time: 01/31/20  6:50 AM  Result Value Ref Range   Sodium 141 135 - 145 mmol/L   Potassium 4.1 3.5 - 5.1 mmol/L   Chloride 105 98 - 111 mmol/L   CO2 26 22 - 32 mmol/L   Glucose, Bld 95 70 - 99 mg/dL    Comment: Glucose reference range applies only to samples taken after fasting for at least 8 hours.    BUN 12 6 - 20 mg/dL   Creatinine, Ser 0.68 0.61 - 1.24 mg/dL   Calcium 9.6 8.9 - 10.3 mg/dL   Total Protein 6.9 6.5 - 8.1 g/dL   Albumin 3.9 3.5 - 5.0 g/dL   AST 21 15 - 41 U/L   ALT 33 0 - 44 U/L   Alkaline Phosphatase 52 38 - 126 U/L   Total Bilirubin 0.6 0.3 - 1.2 mg/dL   GFR calc non Af Amer >60 >60 mL/min   GFR calc Af Amer >60 >60 mL/min   Anion gap 10 5 - 15    Comment: Performed at [facility], [place] [address], [place], [place] [address]    Blood Alcohol level:  Lab Results  Component Value Date   [facility] <10 01/15/2020   ETH <10 [id number]    Metabolic Disorder Labs: Lab Results  Component Value Date   HGBA1C 5.6 01/17/2020   MPG 114.02 01/17/2020   MPG 111.15 06/25/2017   Lab Results  Component Value Date   PROLACTIN 16.1 (H) 01/17/2020   PROLACTIN 42.9 (H) 08/19/2017   Lab Results  Component Value Date   CHOL 154 01/17/2020   TRIG 186 (H) 01/17/2020   HDL 33 (L) 01/17/2020   CHOLHDL  4.7 01/17/2020   VLDL 37 01/17/2020   LDLCALC 84 01/17/2020   LDLCALC 126 (H) 06/25/2017    Physical Findings: AIMS:  , ,  ,  ,    CIWA:    COWS:     Musculoskeletal: Strength & Muscle Tone: within normal limits Gait & Station: normal Patient leans: N/A  Psychiatric Specialty Exam: Physical Exam  Nursing note and vitals reviewed. Constitutional: He is oriented to person, place, and time. He appears well-developed and well-nourished.  Cardiovascular: Normal rate.  Respiratory: Effort normal.  Neurological: He is alert and oriented to person, place, and time.    Review of Systems  Constitutional: Negative.   Respiratory: Negative for cough and shortness of breath.   Gastrointestinal: Negative for nausea and vomiting.  Musculoskeletal: Negative for arthralgias and myalgias.  Neurological: Negative for dizziness, tremors, speech difficulty and light-headedness.  Psychiatric/Behavioral: Positive for agitation and sleep disturbance. Negative for  behavioral problems, confusion, dysphoric mood, hallucinations, self-injury and suicidal ideas. The patient is not nervous/anxious and is not hyperactive.     Blood pressure (!) 142/92, pulse 88, temperature 97.8 F (36.6 C), temperature source Oral, resp. rate 18, height 6\' 1"  (1.854 m), weight 85.7 kg, SpO2 99 %.Body mass index is 24.94 kg/m.  General Appearance: Fairly Groomed  Eye Contact:  Minimal  Speech:  Mildly slurred  Volume:  Normal  Mood:  Irritable  Affect:  Congruent  Thought Process:  Coherent  Orientation:  Full (Time, Place, and Person)  Thought Content:  Logical  Suicidal Thoughts:  No  Homicidal Thoughts:  No  Memory:  Immediate;   Fair Recent;   Fair  Judgement:  Fair  Insight:  Lacking  Psychomotor Activity:  Normal  Concentration:  Concentration: Fair and Attention Span: Fair  Recall:  [employer] of Knowledge:  Fair  Language:  Fair  Akathisia:  No  Handed:  Right  AIMS (if indicated):     Assets:  Communication Skills Desire for Improvement Resilience Social Support  ADL's:  Intact  Cognition:  WNL  Sleep:  Number of Hours: 4.5     Treatment Plan Summary: Daily contact with patient to assess and evaluate symptoms and progress in treatment and Medication management   Continue inpatient hospitalization.  Patient received Kirt Boys on 01/19/20  Decrease gabapentin to 400 mg PO TID for anxiety/mood instability (oversedated) Decrease Zyprexa to 10 mg PO BID for psychosis (oversedated) Discontinue doxepin (oversedated) Increase Depakote to 750 mg PO BID for mood instability Continue Xanax 1 mg PO daily PRN anxiety Continue Ativan 2 mg PO/IM Q6HR PRN agitation Continue Geodon 20 mg IM Q12HR PRN agitation  Patient will participate in the therapeutic group milieu.  Discharge disposition in progress. Patient is on [facility] wait list  Connye Burkitt, NP 01/31/2020, 11:06 AM

## 2020-01-31 NOTE — Progress Notes (Signed)
The patient described his day as having been fair. First of all, he mentioned that he spoke with the judge about his case today. Secondly, he mentioned that he was able to pay some of his bills today. Finally, he indicated that he went outside for fresh air. His goal for tomorrow is to get more fresh air with his peers.

## 2020-01-31 NOTE — Progress Notes (Signed)
Pt is irritable and agitated upon assessment. Pt reports having a panic attack because he said the "first shift nurse decided she knows better than my doctor when I should take and what I shouldn't?" Pt appears oversedated with a slurred speech and a sluggish gait. Pt was requesting his Xanax which was not administered per nursing judgement and was educated about it. Pt denies any current pain in his right elbow or knee from his fall last night.

## 2020-01-31 NOTE — BHH Counselor (Signed)
CSW assisted pt with attending court hearing to request for additional inpatient treatment under IVC for this patient. An additional 7 days was granted for continued inpatient treatment at [facility] for this pt. The full 14 days that was requested will be granted as long as pt's ACT Team/Care Coordinator can confirm that pt will not lose his housing and/or his bills will be able to be paid while he is in the hospital. Pt's case will be reviewed again in 7 days to determine whether or not the additional 7 days will be granted.    Darletta Moll MSW, [place] Worker  [facility]

## 2020-01-31 NOTE — Progress Notes (Signed)
   01/31/20 0740  Psych Admission Type (Psych Patients Only)  Admission Status Involuntary  Psychosocial Assessment  Patient Complaints Anxiety;Panic attack;Agitation  Eye Contact Fair  Facial Expression Animated;Anxious  Affect Anxious;Labile;Irritable  Speech Logical/coherent;Loud;Tangential;Slurred  Interaction Assertive;Demanding;Intrusive  Motor Activity Shuffling  Appearance/Hygiene Unremarkable  Behavior Characteristics Agitated;Anxious;Irritable  Mood Labile;Preoccupied  Thought Process  Coherency Concrete thinking  Content Blaming others;Confabulation;Delusions;Obsessions  Delusions Controlled;Persecutory  Perception WDL  Hallucination None reported or observed  Judgment Limited  Confusion None  Danger to Self  Current suicidal ideation? Denies  Self-Injurious Behavior No self-injurious ideation or behavior indicators observed or expressed   Danger to Others  Danger to Others None reported or observed

## 2020-01-31 NOTE — Progress Notes (Signed)
Pt was sleeping in his room pt stated he had a dream he was running so he got up and ran into the door. Pt stated he hit his R-elbow, R-knee. Pt stated he did not hit his head Vitals: 148/98 , P-84, T 98.6, RR-18. Corene Cornea -NP notified and stated continue to monitor. Pt stated he was ok, pt had no new complaint of pain at this time. Pt did not want staff to contact family at this time. Pt continued to ask for Xanax, pt was informed that that would not be a good idea at this time. Pt went back into his room and went to sleep    01/31/20 0120  What Happened  Was fall witnessed? No  Was patient injured? No  Patient found other (Comment)  Found by No one-pt stated they fell  Stated prior activity ambulating-unassisted  Follow Up  MD notified yes  Time MD notified 62  Family notified No - patient refusal  Additional tests No  Simple treatment Other (comment) (none)  Progress note created (see row info) Yes  Adult Fall Risk Assessment  Risk Factor Category (scoring not indicated) High fall risk per protocol (document High fall risk)  Patient Fall Risk Level High fall risk  Adult Fall Risk Interventions  Required Bundle Interventions *See Row Information* High fall risk - low, moderate, and high requirements implemented  Additional Interventions Room near nurses station  Screening for Fall Injury Risk (To be completed on HIGH fall risk patients) - Assessing Need for Low Bed  Risk For Fall Injury- Low Bed Criteria None identified - Continue screening  Screening for Fall Injury Risk (To be completed on HIGH fall risk patients who do not meet crieteria for Low Bed) - Assessing Need for Floor Mats Only  Risk For Fall Injury- Criteria for Floor Mats None identified - No additional interventions needed  Pain Assessment  Pain Scale 0-10  Pain Score 6  Pain Type Acute pain  Pain Location Generalized  Pain Intervention(s) Emotional support  PAINAD (Pain Assessment in Advanced Dementia)  Breathing 0   Negative Vocalization 0  Facial Expression 1  Body Language 1  Consolability 0  PAINAD Score 2  Neurological  Neuro (WDL) WDL  Musculoskeletal  Musculoskeletal (WDL) WDL  Integumentary  Integumentary (WDL) WDL

## 2020-02-01 MED ORDER — OLANZAPINE 10 MG PO TBDP
20.0000 mg | ORAL_TABLET | Freq: Every day | ORAL | Status: DC
  Administered 2020-02-01: 20 mg via ORAL
  Filled 2020-02-01 (×2): qty 2

## 2020-02-01 MED ORDER — DIVALPROEX SODIUM 500 MG PO DR TAB
500.0000 mg | DELAYED_RELEASE_TABLET | Freq: Every day | ORAL | Status: DC
  Administered 2020-02-02: 500 mg via ORAL
  Filled 2020-02-01 (×2): qty 1

## 2020-02-01 MED ORDER — DIVALPROEX SODIUM 500 MG PO DR TAB
1000.0000 mg | DELAYED_RELEASE_TABLET | Freq: Every evening | ORAL | Status: DC
  Administered 2020-02-01: 1000 mg via ORAL
  Filled 2020-02-01 (×3): qty 2

## 2020-02-01 NOTE — Progress Notes (Signed)
Pt continues to wake up through the night and ask for any medication he can have    02/01/20 0500  Psych Admission Type (Psych Patients Only)  Admission Status Involuntary  Psychosocial Assessment  Patient Complaints Anxiety;Worrying  Eye Contact Fair  Facial Expression Animated;Anxious  Affect Anxious;Labile;Irritable  Speech Logical/coherent;Loud;Tangential;Slurred  Interaction Assertive;Demanding;Intrusive  Motor Activity Shuffling  Appearance/Hygiene Unremarkable  Behavior Characteristics Agitated;Anxious  Mood Suspicious;Anxious  Thought Process  Coherency Concrete thinking  Content Blaming others;Confabulation;Delusions;Obsessions  Delusions Controlled;Persecutory  Perception WDL  Hallucination None reported or observed  Judgment Limited  Confusion None  Danger to Self  Current suicidal ideation? Denies  Self-Injurious Behavior No self-injurious ideation or behavior indicators observed or expressed   Danger to Others  Danger to Others None reported or observed

## 2020-02-01 NOTE — Tx Team (Signed)
Interdisciplinary Treatment and Diagnostic Plan Update  02/01/2020 Time of Session: 9:00am [name] MRN: [id number]  Principal Diagnosis: Schizophrenia, paranoid type [facility])  Secondary Diagnoses: Principal Problem:   Schizophrenia, paranoid type ([place])   Current Medications:  Current Facility-Administered Medications  Medication Dose Route Frequency Provider Last Rate Last Admin  . albuterol (VENTOLIN HFA) 108 (90 Base) MCG/ACT inhaler 2 puff  2 puff Inhalation Q4H PRN Mordecai Maes, NP      . ALPRAZolam [name]) tablet 1 mg  1 mg Oral Daily PRN Sharma Covert, MD   1 mg at 01/29/20 0410  . diphenhydrAMINE (BENADRYL) capsule 50 mg  50 mg Oral Q6H PRN Money, Lowry Ram, FNP   50 mg at 01/30/20 2100   Or  . diphenhydrAMINE (BENADRYL) injection 50 mg  50 mg Intramuscular Q6H PRN Money, Darnelle Maffucci B, FNP      . divalproex (DEPAKOTE) DR tablet 1,000 mg  1,000 mg Oral QPM Sharma Covert, MD      . Derrill Memo ON 02/02/2020] divalproex (DEPAKOTE) DR tablet 500 mg  500 mg Oral Daily Sharma Covert, MD      . gabapentin (NEURONTIN) capsule 400 mg  400 mg Oral TID Connye Burkitt, NP   400 mg at 02/01/20 0752  . HYDROcodone-acetaminophen (NORCO/VICODIN) 5-325 MG per tablet 1 tablet  1 tablet Oral Q12H PRN Sharma Covert, MD   1 tablet at 02/01/20 0310  . lidocaine (LIDODERM) 5 % 1 patch  1 patch Transdermal Q24H Cobos, Myer Peer, MD   1 patch at 01/31/20 1031  . LORazepam (ATIVAN) tablet 2 mg  2 mg Oral Q6H PRN Money, Lowry Ram, FNP   2 mg at 01/31/20 2103   Or  . LORazepam (ATIVAN) injection 2 mg  2 mg Intramuscular Q6H PRN Money, Lowry Ram, FNP   2 mg at 01/18/20 1535  . magnesium hydroxide (MILK OF MAGNESIA) suspension 30 mL  30 mL Oral Daily PRN Money, Darnelle Maffucci B, FNP   30 mL at 01/26/20 1111  . nicotine polacrilex (NICORETTE) gum 2 mg  2 mg Oral PRN Money, Lowry Ram, FNP   2 mg at 01/31/20 1700  . paliperidone (INVEGA SUSTENNA) injection 234 mg  234 mg Intramuscular Q28 days Money, Lowry Ram, [place]   234 mg at 01/19/20 0910  . ziprasidone (GEODON) injection 20 mg  20 mg Intramuscular Q12H PRN Money, Lowry Ram, FNP   20 mg at 01/28/20 2211   PTA Medications: Medications Prior to Admission  Medication Sig Dispense Refill Last Dose  . acetaminophen (TYLENOL) 500 MG tablet Take 1,000 mg by mouth every 6 (six) hours as needed for moderate pain.   Past Month at Unknown time  . albuterol (VENTOLIN HFA) 108 (90 Base) MCG/ACT inhaler Inhale 2 puffs into the lungs every 4 (four) hours as needed for wheezing or shortness of breath. 18 g 1 Past Month at Unknown time  . ALPRAZolam (XANAX) 1 MG tablet Take 1 mg by mouth 3 (three) times daily.    Past Month at Unknown time  . diclofenac sodium (VOLTAREN) 1 % GEL Apply 4 g topically 4 (four) times daily as needed. 500 g 6 Past Month at Unknown time  . HYDROcodone-acetaminophen (NORCO) 10-325 MG tablet Take 1-2 tablets by mouth every 6 (six) hours as needed for moderate pain.   Past Month at Unknown time  . mupirocin ointment (BACTROBAN) 2 % Place 1 application into the nose 2 (two) times daily. 22 g 3 Past Month  at Unknown time  . OLANZapine (ZYPREXA) 10 MG tablet Take 40 mg by mouth at bedtime.    Past Month at Unknown time  . perphenazine (TRILAFON) 4 MG tablet Take 1 tablet (4 mg total) by mouth 2 (two) times daily.   Past Month at Unknown time  . potassium chloride SA (KLOR-CON) 20 MEQ tablet Take 1 tablet (20 mEq total) by mouth 2 (two) times daily. 20 tablet 0 Past Month at Unknown time  . QUEtiapine (SEROQUEL) 25 MG tablet Take 75 mg by mouth daily.   Past Month at Unknown time  . acetaminophen (TYLENOL) 325 MG tablet Take 2 tablets (650 mg total) by mouth every 6 (six) hours as needed for mild pain, fever or headache. (Patient not taking: Reported on 01/17/2020)   Completed Course at Unknown time  . azithromycin (ZITHROMAX) 250 MG tablet Take 1 tablet (250 mg total) by mouth daily. Take first 2 tablets together, then 1 every day until finished.  (Patient not taking: Reported on 01/16/2020) 6 tablet 0 Completed Course at Unknown time  . fluPHENAZine decanoate (PROLIXIN) 25 MG/ML injection Inject into the muscle.       Patient Stressors: Financial difficulties Health problems Legal issue Medication change or noncompliance Substance abuse  Patient Strengths: Supportive family/friends  Treatment Modalities: Medication Management, Group therapy, Case management,  1 to 1 session with clinician, Psychoeducation, Recreational therapy.   Physician Treatment Plan for Primary Diagnosis: Schizophrenia, paranoid type ([place]) Long Term Goal(s): Improvement in symptoms so as ready for discharge Improvement in symptoms so as ready for discharge   Short Term Goals: Ability to identify changes in lifestyle to reduce recurrence of condition will improve Ability to identify and develop effective coping behaviors will improve Ability to demonstrate self-control will improve Ability to identify triggers associated with substance abuse/mental health issues will improve  Medication Management: Evaluate patient's response, side effects, and tolerance of medication regimen.  Therapeutic Interventions: 1 to 1 sessions, Unit Group sessions and Medication administration.  Evaluation of Outcomes: Progressing  Physician Treatment Plan for Secondary Diagnosis: Principal Problem:   Schizophrenia, paranoid type ([place])  Long Term Goal(s): Improvement in symptoms so as ready for discharge Improvement in symptoms so as ready for discharge   Short Term Goals: Ability to identify changes in lifestyle to reduce recurrence of condition will improve Ability to identify and develop effective coping behaviors will improve Ability to demonstrate self-control will improve Ability to identify triggers associated with substance abuse/mental health issues will improve     Medication Management: Evaluate patient's response, side effects, and tolerance of medication  regimen.  Therapeutic Interventions: 1 to 1 sessions, Unit Group sessions and Medication administration.  Evaluation of Outcomes: Progressing   RN Treatment Plan for Primary Diagnosis: Schizophrenia, paranoid type ([place]) Long Term Goal(s): Knowledge of disease and therapeutic regimen to maintain health will improve  Short Term Goals: Ability to verbalize frustration and anger appropriately will improve, Ability to participate in decision making will improve, Ability to identify and develop effective coping behaviors will improve and Compliance with prescribed medications will improve  Medication Management: RN will administer medications as ordered by provider, will assess and evaluate patient's response and provide education to patient for prescribed medication. RN will report any adverse and/or side effects to prescribing provider.  Therapeutic Interventions: 1 on 1 counseling sessions, Psychoeducation, Medication administration, Evaluate responses to treatment, Monitor vital signs and CBGs as ordered, Perform/monitor CIWA, COWS, AIMS and Fall Risk screenings as ordered, Perform wound care treatments as ordered.  Evaluation of Outcomes: Progressing   LCSW Treatment Plan for Primary Diagnosis: Schizophrenia, paranoid type ([place]) Long Term Goal(s): Safe transition to appropriate next level of care at discharge, Engage patient in therapeutic group addressing interpersonal concerns.  Short Term Goals: Engage patient in aftercare planning with referrals and resources, Increase emotional regulation and Increase skills for wellness and recovery  Therapeutic Interventions: Assess for all discharge needs, 1 to 1 time with Social worker, Explore available resources and support systems, Assess for adequacy in community support network, Educate family and significant other(s) on suicide prevention, Complete Psychosocial Assessment, Interpersonal group therapy.  Evaluation of Outcomes:  Progressing  Progress in Treatment: Attending groups: Yes. Participating in groups: Yes. Taking medication as prescribed: Yes. Med seeking.  Toleration medication: Yes. Family/Significant other contact made: No, will contact:  declined collateral consents. Patient understands diagnosis: Yes. Discussing patient identified problems/goals with staff: Yes. Medical problems stabilized or resolved: Yes. Denies suicidal/homicidal ideation: Yes. Issues/concerns per patient self-inventory: No.  New problem(s) identified: Yes, Describe:  limited social supports, medication non-compliance.  New Short Term/Long Term Goal(s): medication management for mood stabilization; elimination of SI thoughts; development of comprehensive mental wellness/sobriety plan.  Patient Goals:  "Discharge."  Discharge Plan or Barriers: Patient has his own apartment in [place]. Is established with [facility] and agrees to continue services with them once discharged. Has been referred to and accepted by [facility] and remains on the waitlist for a bed.  Reason for Continuation of Hospitalization: Aggression Mania Medication stabilization  Estimated Length of Stay: 5-7 days Attendees: Patient:  02/01/2020   Physician:  02/01/2020   Nursing:  02/01/2020   RN Care Manager: 02/01/2020   Social Worker: Darletta Moll, Latanya Presser 02/01/2020   Recreational Therapist:  02/01/2020   Other: Marvia Pickles, NP 02/01/2020   Other: Stephanie Acre, [place] 02/01/2020   Other: 02/01/2020     Scribe for Treatment Team: Joellen Jersey, [place] 02/01/2020 9:19 AM

## 2020-02-01 NOTE — Progress Notes (Signed)
Pt visible in the dayroom this evening. Pt continues to be focused on his medications    02/01/20 2100  Psych Admission Type (Psych Patients Only)  Admission Status Involuntary  Psychosocial Assessment  Patient Complaints Anxiety  Eye Contact Fair;Brief  Facial Expression Animated;Anxious  Affect Anxious;Labile;Irritable  Speech Logical/coherent;Loud;Tangential;Slurred  Interaction Assertive;Demanding;Intrusive  Motor Activity Shuffling  Appearance/Hygiene Unremarkable  Behavior Characteristics Anxious  Mood Suspicious;Labile  Thought Process  Coherency Concrete thinking  Content Blaming others;Confabulation;Delusions;Obsessions  Delusions Controlled;Persecutory  Perception WDL  Hallucination None reported or observed  Judgment Limited  Confusion None  Danger to Self  Current suicidal ideation? Denies  Self-Injurious Behavior No self-injurious ideation or behavior indicators observed or expressed   Danger to Others  Danger to Others None reported or observed

## 2020-02-01 NOTE — Progress Notes (Signed)
Recreation Therapy Notes  Date: 5.26.21 Time: 1115 Location: 500 Hall Dayroom  Group Topic:  Goal Setting  Goal Area(s) Addresses:  Patient will be able to identify four goals they wish to accomplish Patient will identify obstacles that would prevent reaching goals. Patient will identify what they need to achieve goals.  Behavioral Response:  Engaged  Intervention: Worksheet, pencils  Activity: Goal Planning.  Patients were to identify goals they want to accomplish in a week, month, year and five years.  Patients then identified obstacles they may face, what they will need to be successful and what they can start doing now to work towards goals.   Education:  Discharge Planning, Coping Skills, Goal Planning  Education Outcome: Acknowledges Education/In Group Clarification Provided/Needs Additional Education  Clinical Observations: Pt expressed in a week he wants to "fix google accounts", month- buy a car, in a year- rent to own a house and in five years- buy land in another state.  Pt identified obstacles as "the group of people that put me in here".  Pt expressed needing to "buy low, sell high" to achieve goals and start tomorrow by staying peaceful, get along and not get distracted.    Victorino Sparrow , LRT/CTRS    Victorino Sparrow A 02/01/2020 12:18 PM

## 2020-02-01 NOTE — BHH Counselor (Addendum)
CSW spoke with admissions at [facility]. Patient was accepted on 05/24. He remains on their waitlist for a bed. Admissions confirms receipt of all necessary documentation.  CSW faxed progress notes to [facility].  Stephanie Acre, MSW, LCSW-A Clinical Social Worker [facility]

## 2020-02-01 NOTE — Progress Notes (Signed)
[facility] MD Progress Note  02/01/2020 11:12 AM [name]  MRN:  [id number] Subjective:  Patient is a 49 year old male with a past psychiatric history significant for schizophrenia who was admitted on 5/10 after worsening aggressive behavior, contemplating suicide, and threatening his family. His father had reported that he flushed his medications several times prior to admission.  Objective: Patient is seen and examined.  Patient is a 49 year old male with the above-stated past psychiatric history who is seen in follow-up.  He is essentially unchanged from when I last saw him on 01/30/2020.  He still significantly irritable, still not sleeping.  His Depakote is at 750 mg p.o. twice daily.  He also continues on Zyprexa 10 mg p.o. twice daily.  Laboratories from 5/25 showed normal liver function enzymes, normal creatinine, normal CBC.  His Depakote level was 43.  He continues to be on the telephone constantly.  He continues to attempt to get cell phones, and bank accounts.  He calls his mother repeatedly.  His vital signs are stable, he is afebrile.  He slept 4.5 hours last night.  Principal Problem: Schizophrenia, paranoid type ([place]) Diagnosis: Principal Problem:   Schizophrenia, paranoid type ([place])  Total Time spent with patient: 20 minutes  Past Psychiatric History: See admission H&P  Past Medical History:  Past Medical History:  Diagnosis Date  . Asthma   . CHF (congestive heart failure) ([place])   . Chronic back pain   . COPD (chronic obstructive pulmonary disease) ([place])   . Knee pain, chronic   . Migraines   . Schizophrenia, schizo-affective ([place])     Past Surgical History:  Procedure Laterality Date  . KNEE SURGERY     four times  . WISDOM TOOTH EXTRACTION     Family History:  Family History  Problem Relation Age of Onset  . Hypertension Father    Family Psychiatric  History: See admission H&P Social History:  Social History   Substance and Sexual Activity  Alcohol Use No    Comment: occ     Social History   Substance and Sexual Activity  Drug Use No   Comment: Pt denied    Social History   Socioeconomic History  . Marital status: Widowed    Spouse name: Not on file  . Number of children: Not on file  . Years of education: Not on file  . Highest education level: Not on file  Occupational History  . Occupation: on disability  Tobacco Use  . Smoking status: Current Every Day Smoker    Packs/day: 1.00    Years: 33.00    Pack years: 33.00    Types: Cigarettes  . Smokeless tobacco: Never Used  Substance and Sexual Activity  . Alcohol use: No    Comment: occ  . Drug use: No    Comment: Pt denied  . Sexual activity: Not Currently  Other Topics Concern  . Not on file  Social History Narrative   Pt is a widower.  He lives alone, and he receives SSI Disability.  Pt was followed by Dr. Darleene Cleaver, but he has not seen Dr. Darleene Cleaver in several months.   Social Determinants of Health   Financial Resource Strain:   . Difficulty of Paying Living Expenses:   Food Insecurity:   . Worried About Charity fundraiser in the Last Year:   . Arboriculturist in the Last Year:   Transportation Needs:   . Film/video editor (Medical):   Marland Kitchen Lack of Transportation (  Non-Medical):   Physical Activity:   . Days of Exercise per Week:   . Minutes of Exercise per Session:   Stress:   . Feeling of Stress :   Social Connections:   . Frequency of Communication with Friends and Family:   . Frequency of Social Gatherings with Friends and Family:   . Attends Religious Services:   . Active Member of Clubs or Organizations:   . Attends Archivist Meetings:   Marland Kitchen Marital Status:    Additional Social History:                         Sleep: Fair  Appetite:  Good  Current Medications: Current Facility-Administered Medications  Medication Dose Route Frequency Provider Last Rate Last Admin  . albuterol (VENTOLIN HFA) 108 (90 Base) MCG/ACT inhaler 2  puff  2 puff Inhalation Q4H PRN Mordecai Maes, NP      . ALPRAZolam [name]) tablet 1 mg  1 mg Oral Daily PRN Sharma Covert, MD   1 mg at 01/29/20 0410  . diphenhydrAMINE (BENADRYL) capsule 50 mg  50 mg Oral Q6H PRN Money, Lowry Ram, FNP   50 mg at 01/30/20 2100   Or  . diphenhydrAMINE (BENADRYL) injection 50 mg  50 mg Intramuscular Q6H PRN Money, Darnelle Maffucci B, FNP      . divalproex (DEPAKOTE) DR tablet 1,000 mg  1,000 mg Oral QPM Sharma Covert, MD      . Derrill Memo ON 02/02/2020] divalproex (DEPAKOTE) DR tablet 500 mg  500 mg Oral Daily Sharma Covert, MD      . gabapentin (NEURONTIN) capsule 400 mg  400 mg Oral TID Connye Burkitt, NP   400 mg at 02/01/20 0752  . HYDROcodone-acetaminophen (NORCO/VICODIN) 5-325 MG per tablet 1 tablet  1 tablet Oral Q12H PRN Sharma Covert, MD   1 tablet at 02/01/20 0310  . lidocaine (LIDODERM) 5 % 1 patch  1 patch Transdermal Q24H Cobos, Myer Peer, MD   1 patch at 02/01/20 1033  . LORazepam (ATIVAN) tablet 2 mg  2 mg Oral Q6H PRN Money, Lowry Ram, FNP   2 mg at 01/31/20 2103   Or  . LORazepam (ATIVAN) injection 2 mg  2 mg Intramuscular Q6H PRN Money, Lowry Ram, FNP   2 mg at 01/18/20 1535  . magnesium hydroxide (MILK OF MAGNESIA) suspension 30 mL  30 mL Oral Daily PRN Money, Darnelle Maffucci B, FNP   30 mL at 01/26/20 1111  . nicotine polacrilex (NICORETTE) gum 2 mg  2 mg Oral PRN Money, Lowry Ram, FNP   2 mg at 01/31/20 1700  . paliperidone (INVEGA SUSTENNA) injection 234 mg  234 mg Intramuscular Q28 days Money, Lowry Ram, [place]   234 mg at 01/19/20 0910  . ziprasidone (GEODON) injection 20 mg  20 mg Intramuscular Q12H PRN Money, Lowry Ram, FNP   20 mg at 01/28/20 2211    Lab Results:  Results for orders placed or performed during the hospital encounter of 01/16/20 (from the past 48 hour(s))  Valproic acid level     Status: Abnormal   Collection Time: 01/31/20  6:50 AM  Result Value Ref Range   Valproic Acid Lvl 43 (L) 50.0 - 100.0 ug/mL    Comment: Performed at  [facility], [place] [address], [place], [place] [address]  CBC with Differential/Platelet     Status: None   Collection Time: 01/31/20  6:50 AM  Result Value  Ref Range   WBC 7.8 4.0 - 10.5 K/uL   RBC 5.37 4.22 - 5.81 MIL/uL   Hemoglobin 15.8 13.0 - 17.0 g/dL   HCT 49.2 39.0 - 52.0 %   MCV 91.6 80.0 - 100.0 fL   MCH 29.4 26.0 - 34.0 pg   MCHC 32.1 30.0 - 36.0 g/dL   RDW 13.2 11.5 - 15.5 %   Platelets 209 150 - 400 K/uL   nRBC 0.0 0.0 - 0.2 %   Neutrophils Relative % 54 %   Neutro Abs 4.3 1.7 - 7.7 K/uL   Lymphocytes Relative 34 %   Lymphs Abs 2.6 0.7 - 4.0 K/uL   Monocytes Relative 9 %   Monocytes Absolute 0.7 0.1 - 1.0 K/uL   Eosinophils Relative 1 %   Eosinophils Absolute 0.1 0.0 - 0.5 K/uL   Basophils Relative 1 %   Basophils Absolute 0.1 0.0 - 0.1 K/uL   Immature Granulocytes 1 %   Abs Immature Granulocytes 0.06 0.00 - 0.07 K/uL    Comment: Performed at [facility], [place] [address] [place], [place] [address]  Comprehensive metabolic panel     Status: None   Collection Time: 01/31/20  6:50 AM  Result Value Ref Range   Sodium 141 135 - 145 mmol/L   Potassium 4.1 3.5 - 5.1 mmol/L   Chloride 105 98 - 111 mmol/L   CO2 26 22 - 32 mmol/L   Glucose, Bld 95 70 - 99 mg/dL    Comment: Glucose reference range applies only to samples taken after fasting for at least 8 hours.   BUN 12 6 - 20 mg/dL   Creatinine, Ser 0.68 0.61 - 1.24 mg/dL   Calcium 9.6 8.9 - 10.3 mg/dL   Total Protein 6.9 6.5 - 8.1 g/dL   Albumin 3.9 3.5 - 5.0 g/dL   AST 21 15 - 41 U/L   ALT 33 0 - 44 U/L   Alkaline Phosphatase 52 38 - 126 U/L   Total Bilirubin 0.6 0.3 - 1.2 mg/dL   GFR calc non Af Amer >60 >60 mL/min   GFR calc Af Amer >60 >60 mL/min   Anion gap 10 5 - 15    Comment: Performed at [facility], [place] [address], [place], [place] [address]    Blood Alcohol level:  Lab Results  Component Value Date   [facility] <10 01/15/2020   ETH <10  [id number]    Metabolic Disorder Labs: Lab Results  Component Value Date   HGBA1C 5.6 01/17/2020   MPG 114.02 01/17/2020   MPG 111.15 06/25/2017   Lab Results  Component Value Date   PROLACTIN 16.1 (H) 01/17/2020   PROLACTIN 42.9 (H) 08/19/2017   Lab Results  Component Value Date   CHOL 154 01/17/2020   TRIG 186 (H) 01/17/2020   HDL 33 (L) 01/17/2020   CHOLHDL 4.7 01/17/2020   VLDL 37 01/17/2020   LDLCALC 84 01/17/2020   LDLCALC 126 (H) 06/25/2017    Physical Findings: AIMS:  , ,  ,  ,    CIWA:    COWS:     Musculoskeletal: Strength & Muscle Tone: within normal limits Gait & Station: normal Patient leans: N/A  Psychiatric Specialty Exam: Physical Exam  Nursing note and vitals reviewed. Constitutional: He is oriented to person, place, and time. He appears well-developed.  HENT:  Head: Normocephalic and atraumatic.  Respiratory: Effort normal.  Neurological: He is alert and oriented to person, place, and time.  Review of Systems  Blood pressure (!) 122/93, pulse 69, temperature 98.4 F (36.9 C), temperature source Oral, resp. rate 18, height 6\' 1"  (1.854 m), weight 85.7 kg, SpO2 100 %.Body mass index is 24.94 kg/m.  General Appearance: Casual  Eye Contact:  Fair  Speech:  Normal Rate  Volume:  Decreased  Mood:  Anxious and Dysphoric  Affect:  Congruent  Thought Process:  Coherent and Descriptions of Associations: Loose  Orientation:  Full (Time, Place, and Person)  Thought Content:  Delusions and Paranoid Ideation  Suicidal Thoughts:  No  Homicidal Thoughts:  No  Memory:  Immediate;   Fair Recent;   Fair Remote;   Fair  Judgement:  Impaired  Insight:  Lacking  Psychomotor Activity:  Increased  Concentration:  Concentration: Fair and Attention Span: Fair  Recall:  [employer] of Knowledge:  Fair  Language:  Good  Akathisia:  Negative  Handed:  Right  AIMS (if indicated):     Assets:  Desire for Improvement Resilience  ADL's:  Intact   Cognition:  WNL  Sleep:  Number of Hours: 4.5     Treatment Plan Summary: Daily contact with patient to assess and evaluate symptoms and progress in treatment, Medication management and Plan : Patient is seen and examined.  Patient is a 49 year old male with the above-stated past psychiatric history who is seen in follow-up.  Diagnosis: #1 schizophrenia versus schizoaffective disorder; bipolar type, #2 migraine headaches, #3 anxiety, #4 hyperlipidemia  Patient is seen in follow-up.  He is basically the same from yesterday.  I am going to consolidate his Depakote.  We will change that to 500 mg p.o. daily and the 1000 mg p.o. nightly.  No change in his gabapentin at 400 mg p.o. 3 times daily.  I will also stop the daytime Zyprexa, giving 20 mg p.o. nightly.  Hopefully that will get his sleep in order, and move him closer to discharge.  1.  Continue Xanax 1 mg p.o. daily as needed anxiety. 2.  Change Depakote ER to 500 mg p.o. daily and 1000 mg p.o. every afternoon.  This is for mood stability. 3.  Continue gabapentin 400 mg p.o. 3 times daily for mood stability chronic pain. 4.  Continue hydrocodone 1 tablet every 12 hours as needed pain. 5.  Continue Lidoderm patch 1 patch every 24 hours for chronic pain. 6.  Patient received the long-acting Invega injection on 01/19/2020 for psychosis. 7.  Consolidate Zyprexa to 20 mg p.o. nightly but stop the daytime dose for psychosis and sleep. 8.  Continue Zyprexa 20 mg IM every 12 hours as needed agitation. 9.  Disposition planning-in progress. Sharma Covert, MD 02/01/2020, 11:12 AM

## 2020-02-01 NOTE — Progress Notes (Signed)
Adult Psychoeducational Group Note  Date:  02/01/2020 Time:  10:23 PM  Group Topic/Focus:  Wrap-Up Group:   The focus of this group is to help patients review their daily goal of treatment and discuss progress on daily workbooks.  Participation Level:  Active  Participation Quality:  Intrusive  Affect:  Labile  Cognitive:  Oriented  Insight: Lacking  Engagement in Group:  Limited  Modes of Intervention:  Education and Support  Additional Comments:  Patient attended and participated in group tonight. He reports that today he spoke with his mother and tried to talked to his sister. He talked to his doctor and he is schedule to leave tomorrow. Patient stated that he is glad to still be alive and he is looking forward to sleeping in his bed.  Salley Scarlet [facility] 02/01/2020, 10:23 PM

## 2020-02-01 NOTE — Progress Notes (Signed)
   02/01/20 0558  Vital Signs  Pulse Rate 69  BP (!) 122/93  BP Location Right Arm  BP Method Automatic  Patient Position (if appropriate) Standing  D: Patient presents with an anxious affect. Patient took all medicine with some encouragement. Patient out in open areas.  Patient intrusive with staff and patients demanding food. Patient talking continuously to himself and others. Patient is labile at times swearing and out bursting.   A:  Patient took scheduled medicine.  Support and encouragement provided Routine safety checks conducted every 15 minutes. Patient  Informed to notify staff with any concerns.    R:  Safety maintained.

## 2020-02-02 MED ORDER — DIVALPROEX SODIUM 500 MG PO DR TAB: DELAYED_RELEASE_TABLET | ORAL | 0 refills | Status: DC

## 2020-02-02 MED ORDER — HYDROCODONE-ACETAMINOPHEN 5-325 MG PO TABS: 1.0000 | ORAL_TABLET | Freq: Two times a day (BID) | ORAL | 0 refills | Status: DC | PRN

## 2020-02-02 MED ORDER — ALPRAZOLAM 1 MG PO TABS: 1.0000 mg | ORAL_TABLET | Freq: Every day | ORAL | 0 refills | Status: DC | PRN

## 2020-02-02 MED ORDER — GABAPENTIN 400 MG PO CAPS: 400.0000 mg | ORAL_CAPSULE | Freq: Three times a day (TID) | ORAL | 0 refills | Status: DC

## 2020-02-02 MED ORDER — PALIPERIDONE PALMITATE ER 234 MG/1.5ML IM SUSY: 234.0000 mg | PREFILLED_SYRINGE | INTRAMUSCULAR | 0 refills | Status: DC

## 2020-02-02 MED ORDER — OLANZAPINE 20 MG PO TBDP: 20.0000 mg | ORAL_TABLET | Freq: Every day | ORAL | 0 refills | Status: DC

## 2020-02-02 MED ORDER — NICOTINE POLACRILEX 2 MG MT GUM: 2.0000 mg | CHEWING_GUM | OROMUCOSAL | 0 refills | Status: DC | PRN

## 2020-02-02 NOTE — BHH Suicide Risk Assessment (Signed)
[facility] Discharge Suicide Risk Assessment   Principal Problem: Schizophrenia, paranoid type [facility]) Discharge Diagnoses: Principal Problem:   Schizophrenia, paranoid type ([place])   Total Time spent with patient: 20 minutes  Musculoskeletal: Strength & Muscle Tone: within normal limits Gait & Station: normal Patient leans: N/A  Psychiatric Specialty Exam: Review of Systems  All other systems reviewed and are negative.   Blood pressure 118/75, pulse 78, temperature 98.5 F (36.9 C), temperature source Oral, resp. rate 18, height 6\' 1"  (1.854 m), weight 85.7 kg, SpO2 97 %.Body mass index is 24.94 kg/m.  General Appearance: Casual  Eye Contact::  Fair  Speech:  Normal Rate409  Volume:  Normal  Mood:  Euthymic  Affect:  Congruent  Thought Process:  Coherent and Descriptions of Associations: Loose  Orientation:  Full (Time, Place, and Person)  Thought Content:  Delusions  Suicidal Thoughts:  No  Homicidal Thoughts:  No  Memory:  Immediate;   Fair Recent;   Fair Remote;   Fair  Judgement:  Intact  Insight:  Fair  Psychomotor Activity:  Normal  Concentration:  Good  Recall:  Good  Fund of Knowledge:Good  Language: Good  Akathisia:  Negative  Handed:  Right  AIMS (if indicated):     Assets:  Desire for Improvement Housing Resilience  Sleep:  Number of Hours: 4.75  Cognition: WNL  ADL's:  Intact   Mental Status Per Nursing Assessment::   On Admission:  NA  Demographic Factors:  Male, Caucasian, Low socioeconomic status, Living alone and Unemployed  Loss Factors: NA  Historical Factors: Impulsivity  Risk Reduction Factors:   Positive social support and Positive therapeutic relationship  Continued Clinical Symptoms:  Schizophrenia:   Paranoid or undifferentiated type  Cognitive Features That Contribute To Risk:  Closed-mindedness    Suicide Risk:  Minimal: No identifiable suicidal ideation.  Patients presenting with no risk factors but with morbid ruminations;  may be classified as minimal risk based on the severity of the depressive symptoms  Follow-up Information    [facility] Follow up.   Contact information: [address] [place] [address] [phone]        [facility] Follow up.   Specialty: Behavioral Health Contact information: [place]. [place] [phone]          Plan Of Care/Follow-up recommendations:  Activity:  ad lib  Sharma Covert, MD 02/02/2020, 7:43 AM

## 2020-02-02 NOTE — Plan of Care (Signed)
Pt was able to identify benefits at completion of recreation therapy group sessions.    Victorino Sparrow, LRT/CTRS

## 2020-02-02 NOTE — Plan of Care (Signed)
Discharge note  Patient verbalizes readiness for discharge. Follow up plan explained, AVS, Transition record and SRA given. Prescriptions and teaching provided. Belongings returned and signed for. Suicide safety plan completed and signed. Patient verbalizes understanding. Patient denies SI/HI and assures this Probation officer they will seek assistance should that change. Patient discharged to lobby where mother was waiting.   Problem: Education: Goal: Knowledge of [place] General Education information/materials will improve Outcome: Adequate for Discharge Goal: Emotional status will improve Outcome: Adequate for Discharge Goal: Mental status will improve Outcome: Adequate for Discharge Goal: Verbalization of understanding the information provided will improve Outcome: Adequate for Discharge   Problem: Activity: Goal: Will verbalize the importance of balancing activity with adequate rest periods Outcome: Adequate for Discharge   Problem: Health Behavior/Discharge Planning: Goal: Compliance with prescribed medication regimen will improve Outcome: Adequate for Discharge   Problem: Nutritional: Goal: Ability to achieve adequate nutritional intake will improve Outcome: Adequate for Discharge   Problem: Role Relationship: Goal: Ability to communicate needs accurately will improve Outcome: Adequate for Discharge Goal: Ability to interact with others will improve Outcome: Adequate for Discharge   Problem: Safety: Goal: Ability to redirect hostility and anger into socially appropriate behaviors will improve Outcome: Adequate for Discharge Goal: Ability to remain free from injury will improve Outcome: Adequate for Discharge   Problem: Self-Care: Goal: Ability to participate in self-care as condition permits will improve Outcome: Adequate for Discharge   Problem: Self-Concept: Goal: Will verbalize positive feelings about self Outcome: Adequate for Discharge   Problem: Coping: Goal:  Ability to identify and develop effective coping behavior will improve Outcome: Adequate for Discharge   Problem: Self-Concept: Goal: Ability to identify factors that promote anxiety will improve Outcome: Adequate for Discharge Goal: Level of anxiety will decrease Outcome: Adequate for Discharge Goal: Ability to modify response to factors that promote anxiety will improve Outcome: Adequate for Discharge   Problem: Activity: Goal: Interest or engagement in leisure activities will improve Outcome: Adequate for Discharge Goal: Imbalance in normal sleep/wake cycle will improve Outcome: Adequate for Discharge   Problem: Coping: Goal: Coping ability will improve Outcome: Adequate for Discharge Goal: Will verbalize feelings Outcome: Adequate for Discharge

## 2020-02-02 NOTE — BHH Counselor (Signed)
CSW notified [facility] of discharge.   Stephanie Acre, MSW, LCSW-A Clinical Social Worker  [facility]

## 2020-02-02 NOTE — Discharge Summary (Addendum)
Physician Discharge Summary Note  Patient:  [name] is an 49 y.o., male  MRN:  [id number]  DOB:  04/17/1971  Patient phone:  [phone] (home)   Patient address:   [place] [address],   Total Time spent with patient: Greater than 30 minutes  Date of Admission:  01/16/2020  Date of Discharge: 02-02-20  Reason for Admission: Threats of suicide, aggressive behaviors & threats to burn down his Act team.  Principal Problem: Schizophrenia, paranoid type [facility])  Discharge Diagnoses: Patient Active Problem List   Diagnosis Date Noted  . MDD (major depressive disorder), recurrent severe, without psychosis ([place]) [F33.2] 01/16/2020  . 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 Acute respiratory failure with hypoxia ([place]) [J96.01]   . Polysubstance overdose [T50.901A] 02/26/2019  . Laceration of Achilles tendon, right, initial encounter [id number]   . Shock ([place]) [R57.9] 08/28/2018  . Suicide attempt ([place]) [T14.91XA]   . Benzodiazepine (tranquilizer) overdose, intentional self-harm, initial encounter ([place]) [T42.4X2A] 06/13/2018  . Overdose of benzodiazepine, intentional self-harm, initial encounter ([place]) [T42.4X2A]   . Somnolence [R40.0]   . Schizophrenia, paranoid type ([place]) [F20.0] 08/19/2017  . Hypokalemia [E87.6] 04/28/2014  . Drug overdose [T50.901A] 04/27/2014  . Schizoaffective disorder, bipolar type ([place]) [F25.0] 03/02/2014  . Anxiety state [F41.1] 03/02/2014  . Benzodiazepine dependence ([place]) [F13.20] 03/02/2014  . Chest pain [R07.9] 10/11/2013  . EKG abnormalities [R94.31] 10/11/2013  . Chronic back pain [M54.9, G89.29]    Past Psychiatric History: Schizoaffective disorder, Bipolar-type.  Past Medical History:  Past Medical History:  Diagnosis Date  . Asthma   . CHF (congestive heart failure) ([place])   . Chronic back pain   . COPD (chronic obstructive pulmonary disease) ([place])   . Knee pain, chronic   .  Migraines   . Schizophrenia, schizo-affective ([place])     Past Surgical History:  Procedure Laterality Date  . KNEE SURGERY     four times  . WISDOM TOOTH EXTRACTION     Family History:  Family History  Problem Relation Age of Onset  . Hypertension Father    Family Psychiatric  History: See H&P  Social History:  Social History   Substance and Sexual Activity  Alcohol Use No   Comment: occ     Social History   Substance and Sexual Activity  Drug Use No   Comment: Pt denied    Social History   Socioeconomic History  . Marital status: Widowed    Spouse name: Not on file  . Number of children: Not on file  . Years of education: Not on file  . Highest education level: Not on file  Occupational History  . Occupation: on disability  Tobacco Use  . Smoking status: Current Every Day Smoker    Packs/day: 1.00    Years: 33.00    Pack years: 33.00    Types: Cigarettes  . Smokeless tobacco: Never Used  Substance and Sexual Activity  . Alcohol use: No    Comment: occ  . Drug use: No    Comment: Pt denied  . Sexual activity: Not Currently  Other Topics Concern  . Not on file  Social History Narrative   Pt is a widower.  He lives alone, and he receives SSI Disability.  Pt was followed by Dr. Darleene Cleaver, but he has not seen Dr. Darleene Cleaver in several months.   Social Determinants of Health   Financial Resource Strain:   . Difficulty of Paying Living Expenses:  Food Insecurity:   . Worried About Charity fundraiser in the Last Year:   . Arboriculturist in the Last Year:   Transportation Needs:   . Film/video editor (Medical):   Marland Kitchen Lack of Transportation (Non-Medical):   Physical Activity:   . Days of Exercise per Week:   . Minutes of Exercise per Session:   Stress:   . Feeling of Stress :   Social Connections:   . Frequency of Communication with Friends and Family:   . Frequency of Social Gatherings with Friends and Family:   . Attends Religious Services:   .  Active Member of Clubs or Organizations:   . Attends Archivist Meetings:   Marland Kitchen Marital Status:    Hospital Course: (Per Md's admission evaluation notes): [name] Kotais an 49 y.o.malewho presents to the ED under IVC initiated by his son.Per IVC, respondent "has been diagnosed with schizophrenia. He flushed his meds down the toilet. Today, the respondent stated his was contemplating suicide. Respondent is very aggressive. He threatened his family and neighbors because the voices in his head told him they were out to get him. He also threatened to bomb the ACT team. He claims to hear voices and constantly he talks/yells back at them." TTS attempted to contact the petitioner of the IVC [phone] in order to obtain collateral information but did not receive an answer. There was no option to leave a V/M. Pt states he was angry and endorsed SI but denies any plan or intent. Pt states he has attempted suicide "at least 50 times before." Pt states he does not like the medication that he is prescribed and often tries to OD on the meds that he is given. Pt admits he did flush his medication in the toilet three weeks ago as stated on the IVC but he reports he did this because he does not like the way the medication makes him feel. Pt denies HI and denies AVH at present. Pt does admit to previous VH but denies any during the assessment. Pt has been admitted to inpt facilities in the past due to Paranoid Schizophrenia. Pt states he meets with his ACT team regularly. Pt is alert and oriented x3 during the assessment. Pt speech is slurred. Pt eye contact is fair. Pt mood is ambivalent and despair. Pt affect is flat. Pt thought process is coherent and relevant. Pt does not present as anxious during the assessment. Pt responds to questions appropriately.During the evaluation patient was calm and cooperative, alert and oriented. Patient presented under IVC due to history of schizophrenia and suicidal thoughts. He  reports having inaudible voices. He states he is noncompliant with his medications, and flushed them down the toilet. He declines wanting help for his schizophrenia, and is requesting medications that will help " benzodiazapine's and Roxi's." He presents with a flight of ideas and loose associations. Patient is a poor historian with bizarre thought processes who continues to ramble and difficult to redirect at times, he reports that he has lymphoma, pain is the driving force for his anger. He is demanding his medications, PDMP confirmed active prescription of Alprazolam 1mg  po TID, and Hydrocodone/ASAP 10/325 po TID, UDS positive for BZD and THC. He denies suicidal ideations, homicidal ideations, and hallucinations.   After evaluation of his presenting symptoms, [name] was recommended for mood stabilization treatments. The medication regimen for his presenting symptoms were discussed & with his consent initiated. He received, stabilized & was discharged on the  medications as listed below on his discharge medication lists. He was also enrolled & participated in the group counseling sessions being offered & held on this unit. He learned coping skills. He presented with on this admission, other chronic medical conditions that required treatment & monitoring. He was resumed/discharged on all his pertinent home medications for those health issues. He tolerated his treatment regimen without any adverse effects or reactions reported.   And because of the chronic nature of his psychiatric symptoms & their resistance to the treatment regimen in the past, [name] was treated, stabilized & is being discharged on two separate antipsychotic medications Invega monthly injectable & Olanzapine tablets. This is because he has not been able to achieve symptoms control under an antipsychotic monotherapy. However, on this present admission, the combination therapy of Olanzapine & Invega injectable has proved effective in stabilizing his  symptoms warranting this discharge..   During the course of his hospitalization, the 15-minute checks were adequate to ensure Atthew's safety. Patient did not display some emotional outburst which were managed/stabilized with medivations.  He interacted with patients & staff appropriately, participated appropriately in the group sessions/therapies. His medications were addressed & adjusted to meet his needs. He was recommended for outpatient follow-up care & medication management upon discharge to assure his continuity of care.  At the time of this hospital discharge, patient is not reporting any acute suicidal/homicidal ideations. He feels more confident about his mental state. He currently denies any new issues or concerns. Education and supportive counseling provided throughout his hospital stay & upon discharge.   Today upon his discharge evaluation with the attending psychiatrist, [name] shares he is doing well. He denies any other specific concerns. He is sleeping well. His appetite is good. He denies other physical complaints. He denies AH/VH. He feels that his medications have been helpful & is in agreement to continue his current treatment regimen as recommended. He was able to engage in safety planning including plan to return to [facility] or contact emergency services if he feels unable to maintain his own safety or the safety of others. Pt had no further questions, comments, or concerns. He left [facility] with all personal belongings in no apparent distress. Transportation per family (mother).   Physical Findings: AIMS:  , ,  ,  ,    CIWA:    COWS:     Musculoskeletal: Strength & Muscle Tone: within normal limits Gait & Station: normal Patient leans: N/A  Psychiatric Specialty Exam: Physical Exam  Nursing note and vitals reviewed. Constitutional: He appears well-developed.  HENT:  Head: Normocephalic.  Eyes: Pupils are equal, round, and reactive to light.  Cardiovascular:  Elevated pulse rate   Respiratory: Effort normal.  GI: Soft.  Genitourinary:    Genitourinary Comments: Deferred   Musculoskeletal:        General: Normal range of motion.     Cervical back: Normal range of motion.  Neurological: He is alert.  Skin: Skin is warm and dry.    Review of Systems  Constitutional: Negative.  Negative for chills and fever.  HENT: Negative.  Negative for congestion and sore throat.   Eyes: Negative.   Respiratory: Negative.  Negative for cough, shortness of breath and wheezing.   Cardiovascular: Negative.  Negative for chest pain and palpitations.  Gastrointestinal: Negative.  Negative for diarrhea, heartburn, nausea and vomiting.  Genitourinary: Negative.  Negative for dysuria.  Musculoskeletal: Negative.  Negative for myalgias.  Skin: Negative.   Neurological: Negative.  Negative for dizziness, tremors,  seizures, loss of consciousness and headaches.  Endo/Heme/Allergies: Negative.   Psychiatric/Behavioral: Positive for depression (Stabilized with medication prior to discharge) and hallucinations (Hx. Psychosis (Stabilized with medication prior to discharge)). Negative for memory loss, substance abuse and suicidal ideas. The patient has insomnia (Stabilized with medication prior to discharge). The patient is not nervous/anxious (Stable).     Blood pressure 118/75, pulse 78, temperature 98.5 F (36.9 C), temperature source Oral, resp. rate 18, height 6\' 1"  (1.854 m), weight 85.7 kg, SpO2 97 %.Body mass index is 24.94 kg/m.  See Md's discharge SRA   Has this patient used any form of tobacco in the last 30 days? (Cigarettes, Smokeless Tobacco, Cigars, and/or Pipes): No  Blood Alcohol level:  Lab Results  Component Value Date   ETH <10 01/15/2020   ETH <10 [id number]   Metabolic Disorder Labs:  Lab Results  Component Value Date   HGBA1C 5.6 01/17/2020   MPG 114.02 01/17/2020   MPG 111.15 06/25/2017   Lab Results  Component Value Date   PROLACTIN 16.1 (H) 01/17/2020    PROLACTIN 42.9 (H) 08/19/2017   Lab Results  Component Value Date   CHOL 154 01/17/2020   TRIG 186 (H) 01/17/2020   HDL 33 (L) 01/17/2020   CHOLHDL 4.7 01/17/2020   VLDL 37 01/17/2020   LDLCALC 84 01/17/2020   LDLCALC 126 (H) 06/25/2017   See Psychiatric Specialty Exam and Suicide Risk Assessment completed by Attending Physician prior to discharge.  Discharge destination:  Home  Is patient on multiple antipsychotic therapies at discharge:  Yes,   Do you recommend tapering to monotherapy for antipsychotics?  Yes   Has Patient had three or more failed trials of antipsychotic monotherapy by history:  Yes,   Antipsychotic medications that previously failed include:   1.  Perphenazine 9Trilafon).., 2.  fluphenazine (Prolixin).Marland Kitchen and 3.  Olanzapine.  Recommended Plan for Multiple Antipsychotic Therapies: Additional reason(s) for multiple antispychotic treatment: This patient has not been able to achieve symptoms control under an antipsychotic monotherapy making it necessary to add another antipsychotic therapy. The combination of these two antipsychotic therapies has proved effective in stabilizing patient's symptoms warranting this discharge.  Allergies as of 02/02/2020      Reactions   Amoxicillin Diarrhea   Has patient had a PCN reaction causing immediate rash, facial/tongue/throat swelling, SOB or lightheadedness with hypotension: No Has patient had a PCN reaction causing severe rash involving mucus membranes or skin necrosis: No Has patient had a PCN reaction that required hospitalization: No Has patient had a PCN reaction occurring within the last 10 years: No If all of the above answers are "NO", then may proceed with Cephalosporin use. Pt did not elaborate   Dextromethorphan-guaifenesin Other (See Comments)   unknown   Haloperidol Other (See Comments)   unspecified   Haloperidol Lactate    Meloxicam Other (See Comments)   unspecified   Nsaids Other (See Comments)   Rectal  bleeding   Quetiapine Other (See Comments)   Psychosis with high dose (600mg )   Sudafed [pseudoephedrine Hcl] Other (See Comments)   Hallucinations   Ziprasidone Other (See Comments)   unspecified   Prednisone Other (See Comments), Anxiety   Psychotic episodes  Patient states it makes me psychotic      Medication List    STOP taking these medications   acetaminophen 325 MG tablet Commonly known as: TYLENOL   acetaminophen 500 MG tablet Commonly known as: TYLENOL   azithromycin 250 MG tablet Commonly known as: [employer]  fluPHENAZine decanoate 25 MG/ML injection Commonly known as: PROLIXIN   HYDROcodone-acetaminophen 10-325 MG tablet Commonly known as: NORCO Replaced by: HYDROcodone-acetaminophen 5-325 MG tablet   mupirocin ointment 2 % Commonly known as: BACTROBAN   OLANZapine 10 MG tablet Commonly known as: ZYPREXA Replaced by: OLANZapine zydis 20 MG disintegrating tablet   perphenazine 4 MG tablet Commonly known as: TRILAFON   potassium chloride SA 20 MEQ tablet Commonly known as: KLOR-CON   QUEtiapine 25 MG tablet Commonly known as: SEROQUEL     TAKE these medications     Indication  albuterol 108 (90 Base) MCG/ACT inhaler Commonly known as: VENTOLIN HFA Inhale 2 puffs into the lungs every 4 (four) hours as needed for wheezing or shortness of breath.  Indication: Asthma, Chronic Obstructive Lung Disease   ALPRAZolam 1 MG tablet Commonly known as: XANAX Take 1 tablet (1 mg total) by mouth daily as needed for anxiety. For anxiety What changed:   when to take this  reasons to take this  additional instructions  Indication: Feeling Anxious   diclofenac sodium 1 % Gel Commonly known as: VOLTAREN Apply 4 g topically 4 (four) times daily as needed.  Indication: Pain management   divalproex 500 MG DR tablet Commonly known as: DEPAKOTE Take 1 tablet (500 mg) by mouth in the morning & 2 tablets (1,000 mg) at bedtime: For mood stabilization   Indication: Mood stabilization   gabapentin 400 MG capsule Commonly known as: NEURONTIN Take 1 capsule (400 mg total) by mouth 3 (three) times daily. For agitation  Indication: Agitation   HYDROcodone-acetaminophen 5-325 MG tablet Commonly known as: NORCO/VICODIN Take 1 tablet by mouth every 12 (twelve) hours as needed for moderate pain. Replaces: HYDROcodone-acetaminophen 10-325 MG tablet  Indication: Pain   nicotine polacrilex 2 MG gum Commonly known as: NICORETTE Take 1 each (2 mg total) by mouth as needed for smoking cessation.  Indication: Nicotine Addiction   OLANZapine zydis 20 MG disintegrating tablet Commonly known as: ZYPREXA Take 1 tablet (20 mg total) by mouth at bedtime. For mood control Replaces: OLANZapine 10 MG tablet  Indication: Mood control   paliperidone 234 MG/1.5ML Susy injection Commonly known as: INVEGA SUSTENNA Inject 234 mg into the muscle every 28 (twenty-eight) days. (Due on 02-16-20): For mood control Start taking on: February 16, 2020  Indication: Mood control      Follow-up Information    [facility] Follow up.   Contact information: [address] [place] [address] [phone]        [facility] Follow up.   Specialty: Behavioral Health Contact information: [place]. [place] [phone]         Follow-up recommendations: Activity:  As tolerated Diet: As recommended by your primary care doctor. Keep all scheduled follow-up appointments as recommended.    Comments: Prescriptions given at discharge.  Patient agreeable to plan.  Given opportunity to ask questions.  Appears to feel comfortable with discharge denies any current suicidal or homicidal thought. Patient is also instructed prior to discharge to: Take all medications as prescribed by his/her mental healthcare provider. Report any adverse effects and or reactions from the medicines to his/her outpatient  provider promptly. Patient has been instructed & cautioned: To not engage in alcohol and or illegal drug use while on prescription medicines. In the event of worsening symptoms, patient is instructed to call the crisis hotline, 911 and or go to the nearest ED for appropriate evaluation and treatment of symptoms. To  follow-up with his/her primary care provider for your other medical issues, concerns and or health care needs.   Signed: Lindell Spar, NP, PMHNP, FNP-BC 02/02/2020, 9:08 AM

## 2020-02-02 NOTE — Progress Notes (Signed)
  [facility] Adult Case Management Discharge Plan :  Will you be returning to the same living situation after discharge:  Yes,  pt will be returning home. At discharge, do you have transportation home?: Yes,  mother will be picking pt up. Do you have the ability to pay for your medications: Yes,  has insurance.  Release of information consent forms completed and in the chart;  Patient's signature needed at discharge.  Patient to Follow up at: Follow-up Information    [facility] Follow up.   Contact information: [address] [place] [address] [phone]        [facility] Follow up.   Specialty: Behavioral Health Contact information: [place] [place] [phone]          Next level of care provider has access to [place] and Suicide Prevention discussed: Yes,  with patient.     Has patient been referred to the Quitline?: Patient refused referral  Patient has been referred for addiction treatment: Pt. refused referral  Vassie Moselle, [place] 02/02/2020, 9:09 AM

## 2020-02-02 NOTE — Progress Notes (Signed)
Recreation Therapy Notes  Date: 5.27.21 Time: 1000 Location: 500 Hall Dayroom  Group Topic: Anxiety  Goal Area(s) Addresses:  Patient will identify triggers to anxiety. Patient will identify physical symptoms when anxious. Patient will identify coping skills for anxiety.  Behavioral Response: Engaged  Intervention: Worksheet, pencils  Activity: Introduction to Anxiety.  Patients were to identify triggers to anxiety, physical symptoms, thoughts they have and coping skills for being anxious.  Education: Communication, Discharge Planning  Education Outcome: Acknowledges understanding/In group clarification offered/Needs additional education.   Clinical Observations/Feedback: Pt expressed his triggers were being rushed, thinking faster than needed and being told his time is up and he's being discharged.  Pt identified physical symptoms were twitching, tremors and panic.  Pt expressed he has thoughts of staying calm and "I'm crazy for even thinking someone could ever help me".  Pt identified his coping skills were breathing into a paper bag, deep breathing and relaxation techniques.      Victorino Sparrow, LRT/CTRS    Victorino Sparrow A 02/02/2020 10:48 AM

## 2020-02-02 NOTE — Progress Notes (Signed)
Recreation Therapy Notes  INPATIENT RECREATION TR PLAN  Patient Details Name: [name] MRN: [id number] DOB: 03-Nov-1970 Today's Date: 02/02/2020  Rec Therapy Plan Is patient appropriate for Therapeutic Recreation?: Yes Treatment times per week: about 3 days Estimated Length of Stay: 5-7 days TR Treatment/Interventions: Group participation (Comment)  Discharge Criteria Pt will be discharged from therapy if:: Discharged Treatment plan/goals/alternatives discussed and agreed upon by:: Patient/family  Discharge Summary Short term goals set: See patient care plan Short term goals met: Complete Progress toward goals comments: Groups attended Which groups?: Goal setting, Coping skills, Leisure education, Communication Reason goals not met: None Therapeutic equipment acquired: N/A Reason patient discharged from therapy: Discharge from hospital Pt/family agrees with progress & goals achieved: Yes Date patient discharged from therapy: 02/02/20    Victorino Sparrow, LRT/CTRS  Ria Comment, [place] 02/02/2020, 9:41 AM

## 2020-04-11 ENCOUNTER — Encounter (HOSPITAL_COMMUNITY): Payer: Self-pay | Admitting: Emergency Medicine

## 2020-04-11 ENCOUNTER — Emergency Department (HOSPITAL_COMMUNITY)
Admission: EM | Admit: 2020-04-11 | Discharge: 2020-04-12 | Disposition: A | Discharge status: Home / Self Care | Payer: Medicare Other | Attending: Emergency Medicine | Admitting: Emergency Medicine

## 2020-04-11 ENCOUNTER — Other Ambulatory Visit: Payer: Self-pay

## 2020-04-11 DIAGNOSIS — Z5321 Procedure and treatment not carried out due to patient leaving prior to being seen by health care provider: Secondary | ICD-10-CM | POA: Diagnosis not present

## 2020-04-11 DIAGNOSIS — R1011 Right upper quadrant pain: Secondary | ICD-10-CM | POA: Diagnosis present

## 2020-04-11 LAB — CBC
HCT: 47.6 % (ref 39.0–52.0)
Hemoglobin: 15.9 g/dL (ref 13.0–17.0)
MCH: 29.3 pg (ref 26.0–34.0)
MCHC: 33.4 g/dL (ref 30.0–36.0)
MCV: 87.8 fL (ref 80.0–100.0)
Platelets: 216 10*3/uL (ref 150–400)
RBC: 5.42 MIL/uL (ref 4.22–5.81)
RDW: 12.2 % (ref 11.5–15.5)
WBC: 8.2 10*3/uL (ref 4.0–10.5)
nRBC: 0 % (ref 0.0–0.2)

## 2020-04-11 LAB — COMPREHENSIVE METABOLIC PANEL
ALT: 23 U/L (ref 0–44)
AST: 22 U/L (ref 15–41)
Albumin: 4.8 g/dL (ref 3.5–5.0)
Alkaline Phosphatase: 48 U/L (ref 38–126)
Anion gap: 11 (ref 5–15)
BUN: <5 mg/dL — ABNORMAL LOW (ref 6–20)
CO2: 22 mmol/L (ref 22–32)
Calcium: 9.4 mg/dL (ref 8.9–10.3)
Chloride: 104 mmol/L (ref 98–111)
Creatinine, Ser: 0.76 mg/dL (ref 0.61–1.24)
GFR calc Af Amer: >60 mL/min (ref >60)
GFR calc non Af Amer: >60 mL/min (ref >60)
Glucose, Bld: 96 mg/dL (ref 70–99)
Potassium: 3.5 mmol/L (ref 3.5–5.1)
Sodium: 137 mmol/L (ref 135–145)
Total Bilirubin: 0.6 mg/dL (ref 0.3–1.2)
Total Protein: 8.2 g/dL — ABNORMAL HIGH (ref 6.5–8.1)

## 2020-04-11 LAB — URINALYSIS, ROUTINE W REFLEX MICROSCOPIC
Bilirubin Urine: NEGATIVE
Glucose, UA: NEGATIVE mg/dL
Hgb urine dipstick: NEGATIVE
Ketones, ur: NEGATIVE mg/dL
Leukocytes,Ua: NEGATIVE
Nitrite: NEGATIVE
Protein, ur: NEGATIVE mg/dL
Specific Gravity, Urine: 1.001 — ABNORMAL LOW (ref 1.005–1.030)
pH: 7 (ref 5.0–8.0)

## 2020-04-11 LAB — LIPASE, BLOOD: Lipase: 24 U/L (ref 11–51)

## 2020-04-11 MED ORDER — SODIUM CHLORIDE 0.9% FLUSH: 3.0000 mL | Freq: Once | INTRAVENOUS | Status: DC

## 2020-04-11 NOTE — ED Notes (Signed)
Pt left due to wait time  

## 2020-04-11 NOTE — ED Triage Notes (Signed)
Pt presents to ED POV. Pt c/o intermittent RUQ abd pain xmultiple years that worsened today. Pt denies and bowel or bladder complaints.

## 2020-06-15 DIAGNOSIS — F1120 Opioid dependence, uncomplicated: Secondary | ICD-10-CM | POA: Insufficient documentation

## 2020-06-27 ENCOUNTER — Encounter (HOSPITAL_COMMUNITY): Payer: Self-pay | Admitting: Emergency Medicine

## 2020-06-27 ENCOUNTER — Other Ambulatory Visit: Payer: Self-pay

## 2020-06-27 ENCOUNTER — Emergency Department (HOSPITAL_COMMUNITY)
Admission: EM | Admit: 2020-06-27 | Discharge: 2020-06-28 | Disposition: A | Discharge status: Other Acute Inpatient Hospital | Payer: Medicare Other | Attending: Emergency Medicine | Admitting: Emergency Medicine

## 2020-06-27 DIAGNOSIS — F209 Schizophrenia, unspecified: Secondary | ICD-10-CM | POA: Diagnosis present

## 2020-06-27 DIAGNOSIS — J449 Chronic obstructive pulmonary disease, unspecified: Secondary | ICD-10-CM | POA: Diagnosis not present

## 2020-06-27 DIAGNOSIS — F17210 Nicotine dependence, cigarettes, uncomplicated: Secondary | ICD-10-CM | POA: Insufficient documentation

## 2020-06-27 DIAGNOSIS — J45909 Unspecified asthma, uncomplicated: Secondary | ICD-10-CM | POA: Insufficient documentation

## 2020-06-27 DIAGNOSIS — Z79899 Other long term (current) drug therapy: Secondary | ICD-10-CM | POA: Diagnosis not present

## 2020-06-27 DIAGNOSIS — Z20822 Contact with and (suspected) exposure to covid-19: Secondary | ICD-10-CM | POA: Diagnosis not present

## 2020-06-27 DIAGNOSIS — I509 Heart failure, unspecified: Secondary | ICD-10-CM | POA: Insufficient documentation

## 2020-06-27 DIAGNOSIS — Z139 Encounter for screening, unspecified: Secondary | ICD-10-CM

## 2020-06-27 DIAGNOSIS — F29 Unspecified psychosis not due to a substance or known physiological condition: Secondary | ICD-10-CM

## 2020-06-27 LAB — CBC
HCT: 45.3 % (ref 39.0–52.0)
Hemoglobin: 14.6 g/dL (ref 13.0–17.0)
MCH: 29 pg (ref 26.0–34.0)
MCHC: 32.2 g/dL (ref 30.0–36.0)
MCV: 90.1 fL (ref 80.0–100.0)
Platelets: 233 10*3/uL (ref 150–400)
RBC: 5.03 MIL/uL (ref 4.22–5.81)
RDW: 12.4 % (ref 11.5–15.5)
WBC: 8 10*3/uL (ref 4.0–10.5)
nRBC: 0 % (ref 0.0–0.2)

## 2020-06-27 LAB — RESPIRATORY PANEL BY RT PCR (FLU A&B, COVID)
Influenza A by PCR: NEGATIVE
Influenza B by PCR: NEGATIVE
SARS Coronavirus 2 by RT PCR: NEGATIVE

## 2020-06-27 LAB — RAPID URINE DRUG SCREEN, HOSP PERFORMED
Amphetamines: NOT DETECTED
Barbiturates: NOT DETECTED
Benzodiazepines: NOT DETECTED
Cocaine: NOT DETECTED
Opiates: POSITIVE — AB
Tetrahydrocannabinol: NOT DETECTED

## 2020-06-27 LAB — ETHANOL: Alcohol, Ethyl (B): <10 mg/dL (ref <10)

## 2020-06-27 LAB — COMPREHENSIVE METABOLIC PANEL
ALT: 34 U/L (ref 0–44)
AST: 52 U/L — ABNORMAL HIGH (ref 15–41)
Albumin: 4.3 g/dL (ref 3.5–5.0)
Alkaline Phosphatase: 46 U/L (ref 38–126)
Anion gap: 12 (ref 5–15)
BUN: 9 mg/dL (ref 6–20)
CO2: 21 mmol/L — ABNORMAL LOW (ref 22–32)
Calcium: 9.3 mg/dL (ref 8.9–10.3)
Chloride: 106 mmol/L (ref 98–111)
Creatinine, Ser: 0.88 mg/dL (ref 0.61–1.24)
GFR, Estimated: >60 mL/min (ref >60)
Glucose, Bld: 128 mg/dL — ABNORMAL HIGH (ref 70–99)
Potassium: 3.4 mmol/L — ABNORMAL LOW (ref 3.5–5.1)
Sodium: 139 mmol/L (ref 135–145)
Total Bilirubin: 0.6 mg/dL (ref 0.3–1.2)
Total Protein: 7.1 g/dL (ref 6.5–8.1)

## 2020-06-27 LAB — ACETAMINOPHEN LEVEL: Acetaminophen (Tylenol), Serum: <10 ug/mL — ABNORMAL LOW (ref 10–30)

## 2020-06-27 LAB — SALICYLATE LEVEL: Salicylate Lvl: <7 mg/dL — ABNORMAL LOW (ref 7.0–30.0)

## 2020-06-27 MED ORDER — HYDROCODONE-ACETAMINOPHEN 10-325 MG PO TABS: 1.0000 | ORAL_TABLET | Freq: Two times a day (BID) | ORAL | Status: DC | PRN

## 2020-06-27 MED ORDER — ALPRAZOLAM 0.25 MG PO TABS
0.5000 mg | ORAL_TABLET | Freq: Every day | ORAL | Status: DC | PRN
  Filled 2020-06-27: qty 1

## 2020-06-27 MED ORDER — NICOTINE POLACRILEX 2 MG MT GUM
2.0000 mg | CHEWING_GUM | OROMUCOSAL | Status: DC | PRN
  Filled 2020-06-27: qty 1

## 2020-06-27 MED ORDER — DIVALPROEX SODIUM 250 MG PO DR TAB
500.0000 mg | DELAYED_RELEASE_TABLET | Freq: Every day | ORAL | Status: DC
  Filled 2020-06-27 (×2): qty 2

## 2020-06-27 MED ORDER — OLANZAPINE 5 MG PO TBDP
20.0000 mg | ORAL_TABLET | Freq: Every day | ORAL | Status: DC
  Filled 2020-06-27: qty 4

## 2020-06-27 MED ORDER — GABAPENTIN 300 MG PO CAPS
400.0000 mg | ORAL_CAPSULE | Freq: Three times a day (TID) | ORAL | Status: DC
  Filled 2020-06-27 (×2): qty 1

## 2020-06-27 NOTE — BH Assessment (Signed)
Comprehensive Clinical Assessment (CCA) Screening, Triage and Referral Note  06/27/2020 [place] A Rossie [id number]   Per EDP, " Patient presents to the emergency department under IVC.  He was under IVC and brought in by [employer].  Reportedly, patient has been threatening his family and has "torn up his apartment."  He states that "everyone is out to get me."  He attempted to flee from police and hit his head on the bathtub.  There was no LOC.  He denies any drugs or alcohol use.  It is noted that he is positive for opiates.'   During assessment pt presents with thought blocking and tangential speech and mumbles and talks to himself. Pt provides limited history and information pt is not fully oriented and does not respond to all questions. TTS asked what brought patient to hospital he states, " I scared a lot of people". Pt does not elaborate on this. Pt denies SI, HI, , he does admit to [place] states he does hear and see things, but he then recants statement and says he does not experience hallucinations, so unclear at this time that he does experience them. Pt does admit to prior SI attempt by overdose in the past. Pt reports decreased sleep with a good appetite. Pt reports feeling depressed at times, but does not acknowlege the symptoms. Pt denies alcohol and drug use, states he has not used in the last 24 months, but per UDS pt is positive for opiates.  Pt reports he currently sees Dr. Sheppard Evens but is not taking medications at this time. Per pt chart he last hospitilized in May 2021. Pt reports he currently lives alone has social support from son, states he is currently on disability, reports no other stressors at this time. Pt was IVC by his son.    Per IVC: My father was diagnosed with schizophrenia when he was IVC in the past. He is off his meds and has been behaving in a crazy violent way. When I went to check on him tonight I could hear him yelling to people that were not there. He trashed and busted up his  apartment and furniture. He was covered in a red substance that I believed to be blood but he insisted it was food coloring. He told me if I left him tonight that I would lose some one I cared about, meaning him. He behaves in an aggressive and crazy way toward his mother that is scary. Tonight all his irrational and violent behaviors have esculated. He needs help.    Disposition: Lindon Romp, FNP recommends pt for inpatient treatment. Social Work to seek placement for pt at this per [facility] no appropriate beds for pt at this time.  Diagnosis: Schizophrenia    Comprehensive Clinical Assessment (CCA) Note      Visit Diagnosis:      ICD-10-CM   1. Encounter for medical screening examination  Z13.9       CCA Screening, Triage and Referral (STR)  Patient Reported Information How did you hear about [place]? Other (Comment) ([employer])  Referral name: Son/ Nesanel Aguila Referral phone number: [phone]  Whom do you see for routine medical problems? I don't have a doctor  Practice/Facility Name: No data recorded Practice/Facility Phone Number: No data recorded Name of Contact: No data recorded Contact Number: No data recorded Contact Fax Number: No data recorded Prescriber Name: No data recorded Prescriber Address (if known): No data recorded  What Is the Reason for Your Visit/Call Today? IVC for schizophrenia  How Long Has This Been Causing You Problems? <Week  What Do You Feel Would Help You the Most Today? Assessment Only   Have You Recently Been in Any Inpatient Treatment (Hospital/Detox/Crisis Center/28-Day Program)? No  Name/Location of Program/Hospital:No data recorded How Long Were You There? No data recorded When Were You Discharged? No data recorded  Have You Ever Received Services From [facility] Before? YES Who Do You See at [facility]? NO ONE  Have You Recently Had Any Thoughts About Hurting Yourself? No  Are You Planning to Commit Suicide/Harm Yourself At This  time? No   Have you Recently Had Thoughts About [place]? No  Explanation: No data recorded  Have You Used Any Alcohol or Drugs in the Past 24 Hours? Yes  How Long Ago Did You Use Drugs or Alcohol? Unsure What Did You Use and How Much? opiates and unknown amount (opiates and unknown amount)   Do You Currently Have a Therapist/Psychiatrist? Yes  Name of Therapist/Psychiatrist: Dr Sheppard Evens  Have You Been Recently Discharged From Any Office Practice or Programs? No  Explanation of Discharge From Practice/Program: No data recorded    CCA Screening Triage Referral Assessment Type of Contact: Tele-Assessment  Is this Initial or Reassessment? Initial Assessment  Date Telepsych consult ordered in [facility]:  06/27/20 (06/27/2020)  Time Telepsych consult ordered in [facility]:  No data recorded  Patient Reported Information Reviewed? Yes  Patient Left Without Being Seen? No data recorded Reason for Not Completing Assessment: Pt in hallway   Collateral Involvement: none  Does Patient Have a [place]? Unknown Name and Contact of Legal Guardian: self  If Minor and Not Living with Parent(s), Who has Custody? No data recorded Is CPS involved or ever been involved? Never  Is APS involved or ever been involved? Never   Patient Determined To Be At Risk for Harm To Self or Others Based on Review of Patient Reported Information or Presenting Complaint? No  Method: No data recorded Availability of Means: No data recorded Intent: No data recorded Notification Required: No data recorded Additional Information for Danger to Others Potential: No data recorded Additional Comments for Danger to Others Potential: No data recorded Are There Guns or Other Weapons in Your Home? No data recorded Types of Guns/Weapons: No data recorded Are These Weapons Safely Secured?                            No data recorded Who Could Verify You Are Able To Have These Secured: No  data recorded Do You Have any Outstanding Charges, Pending Court Dates, Parole/Probation? No data recorded Contacted To Inform of Risk of Harm To Self or Others: No data recorded  [facility] Assessment: [facility] ED   Does Patient Present under Involuntary Commitment? Yes  IVC Papers Initial File Date: 06/27/20 (06/27/2020)   County of Residence: [place]   Patient Currently Receiving the Following Services: Not Receiving Services   Determination of Need: Urgent (48 hours)   Options For Referral: Inpatient Treatment    CCA Biopsychosocial  Intake/Chief Complaint:  CCA Intake With Chief Complaint CCA Part Two Date: 06/27/20 (06/27/2020)  Mental Health Symptoms Depression:  Depression: Hopelessness  Mania:  Mania: None  Anxiety:   Anxiety: None  Psychosis:  Psychosis: Grossly disorganized speech, Grossly disorganized or catatonic behavior, Duration of symptoms greater than six months, Hallucinations  Trauma:  Trauma: None  Obsessions:  Obsessions: None  Compulsions:  Compulsions: None  Inattention:  Inattention: None  Hyperactivity/Impulsivity:  Hyperactivity/Impulsivity: N/A  Oppositional/Defiant Behaviors:  Oppositional/Defiant Behaviors: None  Emotional Irregularity:  Emotional Irregularity: None  Other Mood/Personality Symptoms:      Mental Status Exam Appearance and self-care  Stature:  Stature: Average  Weight:  Weight: Average weight  Clothing:  Clothing: Casual  Grooming:  Grooming: Neglected  Cosmetic use:  Cosmetic Use: Age appropriate  Posture/gait:  Posture/Gait: Slumped  Motor activity:  Motor Activity: Restless  Sensorium  Attention:  Attention: Distractible, Inattentive, Confused  Concentration:  Concentration: Focuses on irrelevancies, Preoccupied, Scattered  Orientation:  Orientation: Person, Place  Recall/memory:  Recall/Memory: Normal  Affect and Mood  Affect:  Affect: Full Range  Mood:  Mood: Hypomania, Negative  Relating  Eye contact:  Eye  Contact: Fleeting  Facial expression:  Facial Expression: Constricted  Attitude toward examiner:  Attitude Toward Examiner: Uninterested  Thought and Language  Speech flow: Speech Flow: Flight of Ideas, Garbled  Thought content:  Thought Content: Appropriate to Mood and Circumstances, Ideas of Reference  Preoccupation:  Preoccupations: Other (Comment)  Hallucinations:  Hallucinations: Other (Comment)  Organization:     Transport planner of Knowledge:  Fund of Knowledge: Poor  Intelligence:  Intelligence: Average  Abstraction:  Abstraction: Normal  Judgement:  Judgement: Poor  Reality Testing:  Reality Testing: Unaware  Insight:  Insight: Lacking, Gaps  Decision Making:  Decision Making: Vacilates  Social Functioning  Social Maturity:  Social Maturity: Irresponsible  Social Judgement:  Social Judgement: Normal  Stress  Stressors:  Stressors: Other (Comment)  Coping Ability:     Skill Deficits:  Skill Deficits: None  Supports:  Supports: Support needed     CCA Substance Use  Alcohol/Drug LKG:MWNUUVO ( pt denied during assessment, but opiates found in UDS)            ASAM's:  Six Dimensions of Multidimensional Assessment  Dimension 1:  Acute Intoxication and/or Withdrawal Potential:      Dimension 2:  Biomedical Conditions and Complications:      Dimension 3:  Emotional, Behavioral, or Cognitive Conditions and Complications:     Dimension 4:  Readiness to Change:     Dimension 5:  Relapse, Continued use, or Continued Problem Potential:     Dimension 6:  Recovery/Living Environment:     ASAM Severity Score:    ASAM Recommended Level of Treatment:     Substance use Disorder (SUD)    Recommendations for Services/Supports/Treatments:    DSM5 Diagnoses: Patient Active Problem List   Diagnosis Date Noted  . MDD (major depressive disorder), recurrent severe, without psychosis ([place]) 01/16/2020  . 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 Acute respiratory  failure with hypoxia ([place])   . Polysubstance overdose 02/26/2019  . Laceration of Achilles tendon, right, initial encounter   . Shock ([place]) 08/28/2018  . Suicide attempt ([place])   . Benzodiazepine (tranquilizer) overdose, intentional self-harm, initial encounter ([place]) 06/13/2018  . Overdose of benzodiazepine, intentional self-harm, initial encounter ([place])   . Somnolence   . Schizophrenia, paranoid type ([place]) 08/19/2017  . Hypokalemia 04/28/2014  . Drug overdose 04/27/2014  . Schizoaffective disorder, bipolar type ([place]) 03/02/2014  . Anxiety state 03/02/2014  . Benzodiazepine dependence ([place]) 03/02/2014  . Chest pain 10/11/2013  . EKG abnormalities 10/11/2013  . Chronic back pain     Patient Centered Plan: Patient is on the following Treatment Plan(s):    Referrals to Alternative Service(s): Referred to Alternative Service(s):   Place:   Date:   Time:  Referred to Alternative Service(s):   Place:   Date:   Time:    Referred to Alternative Service(s):   Place:   Date:   Time:    Referred to Alternative Service(s):   Place:   Date:   Time:       Donato Heinz, LCSWA

## 2020-06-27 NOTE — ED Notes (Signed)
Personal items placed in locker number 6 ( 1 bag)

## 2020-06-27 NOTE — ED Notes (Signed)
Patient being TTS.  

## 2020-06-27 NOTE — ED Notes (Signed)
Pt stated that he did not want to take the depakote cause "it makes him forget everything" or the gabapentin "b/c he has seen drug users snort it."

## 2020-06-27 NOTE — ED Notes (Signed)
Valuables locked up and taken to security

## 2020-06-27 NOTE — Progress Notes (Signed)
This NT observed pt resting with both eyes closed at start of shift

## 2020-06-27 NOTE — ED Notes (Signed)
Mother called - said pt is own guardian -- states will not take meds - has been hearing voices.  Fraser Din (Mother)  [phone] home   [phone] cell

## 2020-06-27 NOTE — ED Notes (Signed)
Pt remains calm, but still continues to refuse medication.

## 2020-06-27 NOTE — ED Triage Notes (Signed)
Pt brought in by [employer] under IVC.  According to the paperwork he is off his medications and has been threating his family and has torn up his apartment.    Pt did say he was "off all drugs," and just did not want to be alone b/c "everybody is out to get me."  He had red food dye that he put on his walls and clothing to try to "scare them so I didn't have to be alone."  Pt fell as he was trying to get away from the police hitting the back of his head on the tub according to law enforcement.  Law enforcement stated that he did not have a LOC.

## 2020-06-27 NOTE — Progress Notes (Signed)
[name] Romp, FNP recommends pt for inpatient treatment.  After [name] reviewing patient [name] Wynonia Hazard, RN advised CSW that this patient would need to be referred out to other facilities.  [name] does not have an appropriate bed.  Patient was referred to the following facilities:    [name]        [name]        [name]        [name]        [name]       CSW will continue to follow up.  [place] Disposition [phone] (cell)

## 2020-06-27 NOTE — ED Notes (Signed)
Ordered breakfast 

## 2020-06-27 NOTE — Progress Notes (Addendum)
Pt accepted to   [facility], [place], [place], [place] [address], [address]  Dr. Enzo Bi is the attending provider.    Call report to [place] @ [facility]  ED notified.     Pt is IVC.    Pt may be transported by law enforcment.   Pt scheduled  to arrive at [facility] after 9am, Tomorrow (10/21)  Family has not been notified to the lateness of the exceptant call.   [place] Disposition, CSW [phone] (cell)

## 2020-06-27 NOTE — ED Notes (Signed)
RN spoke to employee at [employer], facility has a bed avalible for patient. Employee advised RN that return phone call will be place once bed is confirmed.

## 2020-06-27 NOTE — ED Provider Notes (Signed)
[facility] EMERGENCY DEPARTMENT Provider Note   CSN: [id number] Arrival date & time: 06/27/20  0153     History Chief Complaint  Patient presents with   IVC    [name] is a 49 y.o. male.  Patient presents to the emergency department under IVC.  He was under IVC and brought in by [employer].  Reportedly, patient has been threatening his family and has "torn up his apartment."  He states that "everyone is out to get me."  He attempted to flee from police and hit his head on the bathtub.  There was no LOC.  He denies any drugs or alcohol use.  It is noted that he is positive for opiates.  The history is provided by the patient. No language interpreter was used.       Past Medical History:  Diagnosis Date   Asthma    CHF (congestive heart failure) (HCC)    Chronic back pain    COPD (chronic obstructive pulmonary disease) (HCC)    Knee pain, chronic    Migraines    Schizophrenia, schizo-affective ([place])     Patient Active Problem List   Diagnosis Date Noted   MDD (major depressive disorder), recurrent severe, without psychosis ([place]) 01/16/2020   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   Acute respiratory failure with hypoxia ([place])    Polysubstance overdose 02/26/2019   Laceration of Achilles tendon, right, initial encounter    Shock ([place]) 08/28/2018   Suicide attempt ([place])    Benzodiazepine (tranquilizer) overdose, intentional self-harm, initial encounter ([place]) 06/13/2018   Overdose of benzodiazepine, intentional self-harm, initial encounter ([place])    Somnolence    Schizophrenia, paranoid type ([place]) 08/19/2017   Hypokalemia 04/28/2014   Drug overdose 04/27/2014   Schizoaffective disorder, bipolar type ([place]) 03/02/2014   Anxiety state 03/02/2014   Benzodiazepine dependence ([place]) 03/02/2014   Chest pain 10/11/2013   EKG abnormalities 10/11/2013   Chronic back pain     Past Surgical History:  Procedure Laterality Date    KNEE SURGERY     four times   WISDOM TOOTH EXTRACTION         Family History  Problem Relation Age of Onset   Hypertension Father     Social History   Tobacco Use   Smoking status: Current Every Day Smoker    Packs/day: 1.00    Years: 33.00    Pack years: 33.00    Types: Cigarettes   Smokeless tobacco: Never Used  Scientific laboratory technician Use: Never used  Substance Use Topics   Alcohol use: No    Comment: occ   Drug use: No    Comment: Pt denied    Home Medications Prior to Admission medications   Medication Sig Start Date End Date Taking? Authorizing Provider  albuterol (VENTOLIN HFA) 108 (90 Base) MCG/ACT inhaler Inhale 2 puffs into the lungs every 4 (four) hours as needed for wheezing or shortness of breath. 09/02/19   Varney Biles, MD  ALPRAZolam [name]) 1 MG tablet Take 1 tablet (1 mg total) by mouth daily as needed for anxiety. For anxiety 02/02/20   Lindell Spar I, NP  diclofenac sodium (VOLTAREN) 1 % GEL Apply 4 g topically 4 (four) times daily as needed. 10/22/18   Hilts, Legrand Como, MD  divalproex (DEPAKOTE) 500 MG DR tablet Take 1 tablet (500 mg) by mouth in the morning & 2 tablets (1,000 mg) at bedtime: For mood stabilization 02/02/20   Lindell Spar I, NP  gabapentin (  NEURONTIN) 400 MG capsule Take 1 capsule (400 mg total) by mouth 3 (three) times daily. For agitation 02/02/20   Lindell Spar I, NP  HYDROcodone-acetaminophen (NORCO/VICODIN) 5-325 MG tablet Take 1 tablet by mouth every 12 (twelve) hours as needed for moderate pain. 02/02/20   Lindell Spar I, NP  nicotine polacrilex (NICORETTE) 2 MG gum Take 1 each (2 mg total) by mouth as needed for smoking cessation. 02/02/20   Lindell Spar I, NP  OLANZapine zydis (ZYPREXA) 20 MG disintegrating tablet Take 1 tablet (20 mg total) by mouth at bedtime. For mood control 02/02/20   Lindell Spar I, NP  paliperidone (INVEGA SUSTENNA) 234 MG/1.5ML SUSY injection Inject 234 mg into the muscle every 28 (twenty-eight) days. (Due  on 02-16-20): For mood control 02/16/20   Lindell Spar I, NP    Allergies    Amoxicillin, Dextromethorphan-guaifenesin, Haloperidol, Haloperidol lactate, Meloxicam, Nsaids, Quetiapine, Sudafed [pseudoephedrine hcl], Ziprasidone, and Prednisone  Review of Systems   Review of Systems  All other systems reviewed and are negative.   Physical Exam Updated Vital Signs BP 140/87 (BP Location: Right Arm)    Pulse 65    Temp 98 F (36.7 C) (Oral)    Resp 16    SpO2 96%   Physical Exam Vitals and nursing note reviewed.  Constitutional:      Appearance: He is well-developed.  HENT:     Head: Normocephalic and atraumatic.  Eyes:     Conjunctiva/sclera: Conjunctivae normal.  Cardiovascular:     Rate and Rhythm: Normal rate and regular rhythm.     Heart sounds: No murmur heard.   Pulmonary:     Effort: Pulmonary effort is normal. No respiratory distress.     Breath sounds: Normal breath sounds.  Abdominal:     Palpations: Abdomen is soft.     Tenderness: There is no abdominal tenderness.  Musculoskeletal:     Cervical back: Neck supple.  Skin:    General: Skin is warm and dry.  Neurological:     Mental Status: He is alert and oriented to person, place, and time.  Psychiatric:        Mood and Affect: Mood normal.        Behavior: Behavior normal.     ED Results / Procedures / Treatments   Labs (all labs ordered are listed, but only abnormal results are displayed) Labs Reviewed  COMPREHENSIVE METABOLIC PANEL - Abnormal; Notable for the following components:      Result Value   Potassium 3.4 (*)    CO2 21 (*)    Glucose, Bld 128 (*)    AST 52 (*)    All other components within normal limits  SALICYLATE LEVEL - Abnormal; Notable for the following components:   Salicylate Lvl <1.6 (*)    All other components within normal limits  ACETAMINOPHEN LEVEL - Abnormal; Notable for the following components:   Acetaminophen (Tylenol), Serum <10 (*)    All other components within  normal limits  RAPID URINE DRUG SCREEN, HOSP PERFORMED - Abnormal; Notable for the following components:   Opiates POSITIVE (*)    All other components within normal limits  RESPIRATORY PANEL BY RT PCR (FLU A&B, COVID)  ETHANOL  CBC    EKG None  Radiology No results found.  Procedures Procedures (including critical care time)  Medications Ordered in ED Medications - No data to display  ED Course  I have reviewed the triage vital signs and the nursing notes.  Pertinent labs &  imaging results that were available during my care of the patient were reviewed by me and considered in my medical decision making (see chart for details).    MDM Rules/Calculators/A&P                          Patient here under IVC.  Patient seems paranoid.  Has history of schizophrenia.  Vital signs are stable.  Laboratory work-up is fairly reassuring.  Patient appears medically clear for TTS evaluation.  Disposition pending TTS. Final Clinical Impression(s) / ED Diagnoses Final diagnoses:  Encounter for medical screening examination    Rx / DC Orders ED Discharge Orders    None       Montine Circle, PA-C 06/27/20 0413    Palumbo, April, MD 06/27/20 3779

## 2020-06-27 NOTE — ED Provider Notes (Signed)
Emergency Medicine Observation Re-evaluation Note  Mali A Laverdure is a 49 y.o. male, seen on rounds today.  Pt initially presented to the ED for complaints of IVC Patient medically cleared by previous team.  Currently, the patient is sleeping comfortably in bed, no acute distress.  Is arousable to voice.  Reports that he is feeling well he has no concerns.  Reports he just ate some pancakes which he enjoyed.   Physical Exam  BP 131/82 (BP Location: Left Arm)   Pulse (!) 49   Temp 98.4 F (36.9 C) (Oral)   Resp 16   SpO2 98%  Physical Exam General: Alert, oriented Lungs: Respirations regular and unlabored Psych: Pleasant, cooperative  ED Course / MDM  EKG:    I have reviewed the labs performed to date as well as medications administered while in observation.  Recent changes in the last 24 hours include none.  Reviewed old labs, UDS positive for opioids.  CBC without looks ptosis or anemia.  CMP without emergent electrolyte derangement, AKI, emergent LFT elevations or gap.  Tylenol/salicylate levels negative.  Ethanol negative, patient does not appear intoxicated or in withdrawal.  Covid/influenza panel negative.  Vital signs from this morning unremarkable.  Plan  Current plan is for awaiting inpatient placement. Patient is under full IVC at this time.   Deliah Boston, PA-C 06/27/20 1000    Fredia Sorrow, MD 06/27/20 1020

## 2020-06-28 MED ORDER — ACETAMINOPHEN 500 MG PO TABS
1000.0000 mg | ORAL_TABLET | Freq: Once | ORAL | Status: AC
  Administered 2020-06-28: 1000 mg via ORAL
  Filled 2020-06-28: qty 2

## 2020-06-28 NOTE — ED Notes (Signed)
Nursing secretary notified sheriff for transport.

## 2020-06-28 NOTE — ED Provider Notes (Signed)
Pt has been accepted for transfer to [facility] by Dr. Abbey Chatters.  Pt reexamined and remains stable for transfer.   Isla Pence, MD 06/28/20 0830

## 2020-06-28 NOTE — ED Notes (Signed)
Patient appears anxious but refuses any medication and states that he wants his wallet with ID and that he is hungry. Nurse offers sandwich to patient but patient states he does not want a sandwich that he wants pasta. Nurse explains to patient why we can not offer him pasta at this time and also informs patient that his belongings are with security and they are locked in a locker. Patient verbalizes understanding.

## 2020-06-28 NOTE — ED Notes (Signed)
Pt is walking around his room pulling emergency button mumbling to himself. Redirectable.
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
## 2020-09-09 DIAGNOSIS — B078 Other viral warts: Secondary | ICD-10-CM | POA: Insufficient documentation

## 2020-10-24 ENCOUNTER — Other Ambulatory Visit: Payer: Self-pay | Admitting: Family Medicine

## 2020-10-24 ENCOUNTER — Ambulatory Visit
Admission: RE | Admit: 2020-10-24 | Discharge: 2020-10-24 | Disposition: A | Discharge status: Home / Self Care | Payer: Medicare Other | Source: Ambulatory Visit | Attending: Family Medicine | Admitting: Family Medicine

## 2020-10-24 DIAGNOSIS — R0602 Shortness of breath: Secondary | ICD-10-CM

## 2020-12-05 ENCOUNTER — Emergency Department (HOSPITAL_COMMUNITY)
Admission: EM | Admit: 2020-12-05 | Discharge: 2020-12-06 | Disposition: A | Discharge status: Home / Self Care | Payer: Medicare Other | Attending: Emergency Medicine | Admitting: Emergency Medicine

## 2020-12-05 DIAGNOSIS — C8590 Non-Hodgkin lymphoma, unspecified, unspecified site: Secondary | ICD-10-CM | POA: Insufficient documentation

## 2020-12-05 DIAGNOSIS — M549 Dorsalgia, unspecified: Secondary | ICD-10-CM | POA: Insufficient documentation

## 2020-12-05 DIAGNOSIS — Z5321 Procedure and treatment not carried out due to patient leaving prior to being seen by health care provider: Secondary | ICD-10-CM | POA: Diagnosis not present

## 2020-12-05 LAB — CBC
HCT: 44.1 % (ref 39.0–52.0)
Hemoglobin: 14.5 g/dL (ref 13.0–17.0)
MCH: 29.2 pg (ref 26.0–34.0)
MCHC: 32.9 g/dL (ref 30.0–36.0)
MCV: 88.9 fL (ref 80.0–100.0)
Platelets: 214 10*3/uL (ref 150–400)
RBC: 4.96 MIL/uL (ref 4.22–5.81)
RDW: 12.7 % (ref 11.5–15.5)
WBC: 10.7 10*3/uL — ABNORMAL HIGH (ref 4.0–10.5)
nRBC: 0 % (ref 0.0–0.2)

## 2020-12-05 LAB — URINALYSIS, ROUTINE W REFLEX MICROSCOPIC
Bilirubin Urine: NEGATIVE
Glucose, UA: NEGATIVE mg/dL
Hgb urine dipstick: NEGATIVE
Ketones, ur: NEGATIVE mg/dL
Leukocytes,Ua: NEGATIVE
Nitrite: NEGATIVE
Protein, ur: NEGATIVE mg/dL
Specific Gravity, Urine: 1.029 (ref 1.005–1.030)
pH: 5 (ref 5.0–8.0)

## 2020-12-05 NOTE — ED Triage Notes (Signed)
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 Pt states hid lymphoma is hurting "if we can cut it out that will be great" Pt states he need to take a shower haven't had one in a couple of days

## 2020-12-06 LAB — RAPID URINE DRUG SCREEN, HOSP PERFORMED
Amphetamines: NOT DETECTED
Barbiturates: NOT DETECTED
Benzodiazepines: POSITIVE — AB
Cocaine: NOT DETECTED
Opiates: POSITIVE — AB
Tetrahydrocannabinol: NOT DETECTED

## 2020-12-06 LAB — COMPREHENSIVE METABOLIC PANEL
ALT: 26 U/L (ref 0–44)
AST: 32 U/L (ref 15–41)
Albumin: 4 g/dL (ref 3.5–5.0)
Alkaline Phosphatase: 46 U/L (ref 38–126)
Anion gap: 9 (ref 5–15)
BUN: 6 mg/dL (ref 6–20)
CO2: 21 mmol/L — ABNORMAL LOW (ref 22–32)
Calcium: 9.2 mg/dL (ref 8.9–10.3)
Chloride: 106 mmol/L (ref 98–111)
Creatinine, Ser: 0.89 mg/dL (ref 0.61–1.24)
GFR, Estimated: >60 mL/min (ref >60)
Glucose, Bld: 121 mg/dL — ABNORMAL HIGH (ref 70–99)
Potassium: 3.4 mmol/L — ABNORMAL LOW (ref 3.5–5.1)
Sodium: 136 mmol/L (ref 135–145)
Total Bilirubin: 0.6 mg/dL (ref 0.3–1.2)
Total Protein: 6.7 g/dL (ref 6.5–8.1)

## 2020-12-06 LAB — SALICYLATE LEVEL: Salicylate Lvl: <7 mg/dL — ABNORMAL LOW (ref 7.0–30.0)

## 2020-12-06 LAB — ACETAMINOPHEN LEVEL: Acetaminophen (Tylenol), Serum: 19 ug/mL (ref 10–30)

## 2020-12-06 LAB — ETHANOL: Alcohol, Ethyl (B): <10 mg/dL (ref <10)

## 2020-12-06 NOTE — ED Notes (Signed)
Pt walked up to said tech ad stated "I am leaving." LWBS, moving OTF.

## 2020-12-07 ENCOUNTER — Emergency Department (HOSPITAL_COMMUNITY)
Admission: EM | Admit: 2020-12-07 | Discharge: 2020-12-08 | Disposition: A | Discharge status: Home / Self Care | Payer: Medicare Other | Attending: Emergency Medicine | Admitting: Emergency Medicine

## 2020-12-07 ENCOUNTER — Other Ambulatory Visit: Payer: Self-pay

## 2020-12-07 ENCOUNTER — Encounter (HOSPITAL_COMMUNITY): Payer: Self-pay | Admitting: Emergency Medicine

## 2020-12-07 DIAGNOSIS — F17210 Nicotine dependence, cigarettes, uncomplicated: Secondary | ICD-10-CM | POA: Diagnosis not present

## 2020-12-07 DIAGNOSIS — F23 Brief psychotic disorder: Secondary | ICD-10-CM

## 2020-12-07 DIAGNOSIS — Z20822 Contact with and (suspected) exposure to covid-19: Secondary | ICD-10-CM | POA: Insufficient documentation

## 2020-12-07 DIAGNOSIS — I509 Heart failure, unspecified: Secondary | ICD-10-CM | POA: Insufficient documentation

## 2020-12-07 DIAGNOSIS — Z79899 Other long term (current) drug therapy: Secondary | ICD-10-CM | POA: Diagnosis not present

## 2020-12-07 DIAGNOSIS — F259 Schizoaffective disorder, unspecified: Secondary | ICD-10-CM | POA: Diagnosis not present

## 2020-12-07 DIAGNOSIS — J449 Chronic obstructive pulmonary disease, unspecified: Secondary | ICD-10-CM | POA: Insufficient documentation

## 2020-12-07 DIAGNOSIS — F209 Schizophrenia, unspecified: Secondary | ICD-10-CM | POA: Insufficient documentation

## 2020-12-07 DIAGNOSIS — R451 Restlessness and agitation: Secondary | ICD-10-CM | POA: Diagnosis present

## 2020-12-07 DIAGNOSIS — J45909 Unspecified asthma, uncomplicated: Secondary | ICD-10-CM | POA: Insufficient documentation

## 2020-12-07 DIAGNOSIS — I110 Hypertensive heart disease with heart failure: Secondary | ICD-10-CM | POA: Diagnosis not present

## 2020-12-07 DIAGNOSIS — Z046 Encounter for general psychiatric examination, requested by authority: Secondary | ICD-10-CM | POA: Diagnosis not present

## 2020-12-07 DIAGNOSIS — R45851 Suicidal ideations: Secondary | ICD-10-CM

## 2020-12-07 LAB — COMPREHENSIVE METABOLIC PANEL
ALT: 25 U/L (ref 0–44)
AST: 33 U/L (ref 15–41)
Albumin: 4.1 g/dL (ref 3.5–5.0)
Alkaline Phosphatase: 47 U/L (ref 38–126)
Anion gap: 7 (ref 5–15)
BUN: 6 mg/dL (ref 6–20)
CO2: 25 mmol/L (ref 22–32)
Calcium: 10 mg/dL (ref 8.9–10.3)
Chloride: 106 mmol/L (ref 98–111)
Creatinine, Ser: 0.76 mg/dL (ref 0.61–1.24)
GFR, Estimated: >60 mL/min (ref >60)
Glucose, Bld: 120 mg/dL — ABNORMAL HIGH (ref 70–99)
Potassium: 4.1 mmol/L (ref 3.5–5.1)
Sodium: 138 mmol/L (ref 135–145)
Total Bilirubin: 0.8 mg/dL (ref 0.3–1.2)
Total Protein: 7 g/dL (ref 6.5–8.1)

## 2020-12-07 LAB — ETHANOL: Alcohol, Ethyl (B): <10 mg/dL (ref <10)

## 2020-12-07 LAB — CBC
HCT: 46.9 % (ref 39.0–52.0)
Hemoglobin: 15.7 g/dL (ref 13.0–17.0)
MCH: 29.8 pg (ref 26.0–34.0)
MCHC: 33.5 g/dL (ref 30.0–36.0)
MCV: 89 fL (ref 80.0–100.0)
Platelets: 223 10*3/uL (ref 150–400)
RBC: 5.27 MIL/uL (ref 4.22–5.81)
RDW: 12.9 % (ref 11.5–15.5)
WBC: 7.4 10*3/uL (ref 4.0–10.5)
nRBC: 0 % (ref 0.0–0.2)

## 2020-12-07 LAB — RESP PANEL BY RT-PCR (FLU A&B, COVID) ARPGX2
Influenza A by PCR: NEGATIVE
Influenza B by PCR: NEGATIVE
SARS Coronavirus 2 by RT PCR: NEGATIVE

## 2020-12-07 LAB — VALPROIC ACID LEVEL: Valproic Acid Lvl: <10 ug/mL — ABNORMAL LOW (ref 50.0–100.0)

## 2020-12-07 LAB — SALICYLATE LEVEL: Salicylate Lvl: <7 mg/dL — ABNORMAL LOW (ref 7.0–30.0)

## 2020-12-07 LAB — ACETAMINOPHEN LEVEL: Acetaminophen (Tylenol), Serum: <10 ug/mL — ABNORMAL LOW (ref 10–30)

## 2020-12-07 MED ORDER — DIVALPROEX SODIUM 250 MG PO DR TAB: 1000.0000 mg | DELAYED_RELEASE_TABLET | Freq: Every day | ORAL | Status: DC

## 2020-12-07 MED ORDER — DIVALPROEX SODIUM 250 MG PO DR TAB
500.0000 mg | DELAYED_RELEASE_TABLET | Freq: Every day | ORAL | Status: DC
  Filled 2020-12-07: qty 2

## 2020-12-07 MED ORDER — OLANZAPINE 5 MG PO TBDP
20.0000 mg | ORAL_TABLET | Freq: Every day | ORAL | Status: DC
  Filled 2020-12-07 (×2): qty 4

## 2020-12-07 MED ORDER — GABAPENTIN 300 MG PO CAPS
400.0000 mg | ORAL_CAPSULE | Freq: Three times a day (TID) | ORAL | Status: DC
  Administered 2020-12-08 (×2): 400 mg via ORAL
  Filled 2020-12-07 (×3): qty 1

## 2020-12-07 NOTE — BHH Counselor (Signed)
TTS complete. Pending provider review.

## 2020-12-07 NOTE — ED Notes (Signed)
Pt uncooperative, unable to sign MSE waiver at this time.

## 2020-12-07 NOTE — BH Assessment (Addendum)
Dr. Dwyane Dee recommends in patient treatment. CSW to seek placement. RN notified of disposition.  Per [place] Alvira Philips, MSN), [facility] has no bed availability at this time.   Received instruction from provider Letitia Libra, NP) that Medical Director (Dr. Hampton Abbot) asked for patient to be faxed out.   Dispsition Counselor faxed patient to the following facilities for consideration of bed placement.    [facility] Details     [facility] Details     [facility] Details     [facility] Details     [facility] Details     [facility] Details     [facility] Details     [facility] Details     [place] Details     [facility] Details     [facility] Details     [facility] Details     [place]

## 2020-12-07 NOTE — BH Assessment (Addendum)
Per Intake Staff [facility]) @ [facility], patient accepted to the main campus 12/08/2020 (after 8am). The accepting provider is Dr. Darnelle Catalan. Nurse report [phone]. [facility] nurse (Morene Rankins, RN) given disposition updates.

## 2020-12-07 NOTE — ED Provider Notes (Signed)
[name] Provider Note   CSN: [id number] Arrival date & time: 12/07/20  0217     History Chief Complaint  Patient presents with  . IVC    [name] is a 50 y.o. male.  Patient with hx schizophrenia, presents via [employer] with IVC. Son indicates patient with agitated, argumentative behavior w son tonight, and that patient threatened suicide. In ED, pt very poor historian, states he is only here because he wants to 'move out of apartment b/c too many Muslims', and then talks about how he is 'living in multiple universes at once.' - level 5 caveat, psychiatric illness. Pt indicates compliant w meds. Denies overdose. Denies substance abuse. Denies acute physical health problems or symptoms.    The history is provided by the patient, medical records and the police. The history is limited by the condition of the patient.       Past Medical History:  Diagnosis Date  . Asthma   . CHF (congestive heart failure) ([place])   . Chronic back pain   . COPD (chronic obstructive pulmonary disease) ([place])   . Knee pain, chronic   . Migraines   . Schizophrenia, schizo-affective [facility])     Patient Active Problem List   Diagnosis Date Noted  . 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 Acute respiratory failure with hypoxia ([place])   . Polysubstance overdose 02/26/2019  . Laceration of Achilles tendon, right, initial encounter   . Shock ([place]) 08/28/2018  . Suicide attempt ([place])   . Benzodiazepine (tranquilizer) overdose, intentional self-harm, initial encounter ([place]) 06/13/2018  . Overdose of benzodiazepine, intentional self-harm, initial encounter ([place])   . Somnolence   . Schizophrenia, paranoid type ([place]) 08/19/2017  . Hypokalemia 04/28/2014  . Drug overdose 04/27/2014  . Schizoaffective disorder, bipolar type ([place]) 03/02/2014  . Anxiety state 03/02/2014  .  Benzodiazepine dependence ([place]) 03/02/2014  . Chest pain 10/11/2013  . EKG abnormalities 10/11/2013  . Chronic back pain     Past Surgical History:  Procedure Laterality Date  . KNEE SURGERY     four times  . WISDOM TOOTH EXTRACTION         Family History  Problem Relation Age of Onset  . Hypertension Father     Social History   Tobacco Use  . Smoking status: Current Every Day Smoker    Packs/day: 1.00    Years: 33.00    Pack years: 33.00    Types: Cigarettes  . Smokeless tobacco: Never Used  Vaping Use  . Vaping Use: Never used  Substance Use Topics  . Alcohol use: No    Comment: occ  . Drug use: No    Comment: Pt denied    Home Medications Prior to Admission medications   Medication Sig Start Date End Date Taking? Authorizing Provider  acetaminophen (TYLENOL) 325 MG tablet Take 650 mg by mouth every 6 (six) hours as needed for mild pain, fever or headache.    [provider]  albuterol (VENTOLIN HFA) 108 (90 Base) MCG/ACT inhaler Inhale 2 puffs into the lungs every 4 (four) hours as needed for wheezing or shortness of breath. Patient not taking: Reported on 06/27/2020 09/02/19   Varney Biles, MD  ALPRAZolam [name]) 1 MG tablet Take 1 tablet (1 mg total) by mouth daily as needed for anxiety. For anxiety 02/02/20   Lindell Spar I, NP  diclofenac sodium (VOLTAREN) 1 % GEL Apply  4 g topically 4 (four) times daily as needed. Patient not taking: Reported on 06/27/2020 10/22/18   Hilts, Legrand Como, MD  divalproex (DEPAKOTE) 500 MG DR tablet Take 1 tablet (500 mg) by mouth in the morning & 2 tablets (1,000 mg) at bedtime: For mood stabilization Patient not taking: Reported on 06/27/2020 02/02/20   Lindell Spar I, NP  gabapentin (NEURONTIN) 400 MG capsule Take 1 capsule (400 mg total) by mouth 3 (three) times daily. For agitation Patient not taking: Reported on 06/27/2020 02/02/20   Lindell Spar I, NP  HYDROcodone-acetaminophen (NORCO) 10-325 MG tablet Take 1 tablet  by mouth 3 (three) times daily as needed for pain. 06/05/20   [provider]  HYDROcodone-acetaminophen (NORCO/VICODIN) 5-325 MG tablet Take 1 tablet by mouth every 12 (twelve) hours as needed for moderate pain. Patient not taking: Reported on 06/27/2020 02/02/20   Lindell Spar I, NP  nicotine polacrilex (NICORETTE) 2 MG gum Take 1 each (2 mg total) by mouth as needed for smoking cessation. Patient not taking: Reported on 06/27/2020 02/02/20   Lindell Spar I, NP  OLANZapine zydis (ZYPREXA) 20 MG disintegrating tablet Take 1 tablet (20 mg total) by mouth at bedtime. For mood control Patient not taking: Reported on 06/27/2020 02/02/20   Lindell Spar I, NP  paliperidone (INVEGA SUSTENNA) 234 MG/1.5ML SUSY injection Inject 234 mg into the muscle every 28 (twenty-eight) days. (Due on 02-16-20): For mood control Patient not taking: Reported on 06/27/2020 02/16/20   Lindell Spar I, NP    Allergies    Amoxicillin, Dextromethorphan-guaifenesin, Haloperidol, Haloperidol lactate, Meloxicam, Nsaids, Quetiapine, Sudafed [pseudoephedrine hcl], Ziprasidone, and Prednisone  Review of Systems   Review of Systems  Constitutional: Negative for fever.  HENT: Negative for sore throat.   Eyes: Negative for visual disturbance.  Respiratory: Negative for shortness of breath.   Cardiovascular: Negative for chest pain.  Gastrointestinal: Negative for abdominal pain.  Genitourinary: Negative for flank pain.  Musculoskeletal: Negative for back pain and neck pain.  Skin: Negative for rash.  Neurological: Negative for headaches.  Hematological: Does not bruise/bleed easily.  Psychiatric/Behavioral: Negative for self-injury.    Physical Exam Updated Vital Signs BP (!) 122/91 (BP Location: Left Arm)   Pulse 95   Temp 98.1 F (36.7 C) (Oral)   Resp 16   SpO2 96%   Physical Exam Vitals and nursing note reviewed.  Constitutional:      Appearance: Normal appearance. He is well-developed.  HENT:      Head: Atraumatic.     Nose: Nose normal.     Mouth/Throat:     Mouth: Mucous membranes are moist.     Pharynx: Oropharynx is clear.  Eyes:     General: No scleral icterus.    Conjunctiva/sclera: Conjunctivae normal.     Pupils: Pupils are equal, round, and reactive to light.  Neck:     Trachea: No tracheal deviation.  Cardiovascular:     Rate and Rhythm: Normal rate and regular rhythm.     Pulses: Normal pulses.     Heart sounds: Normal heart sounds. No murmur heard. No friction rub. No gallop.   Pulmonary:     Effort: Pulmonary effort is normal. No accessory muscle usage or respiratory distress.     Breath sounds: Normal breath sounds.  Abdominal:     General: Bowel sounds are normal. There is no distension.     Palpations: Abdomen is soft.     Tenderness: There is no abdominal tenderness.  Genitourinary:    Comments:  No cva tenderness. Musculoskeletal:        General: No swelling.     Cervical back: Normal range of motion and neck supple. No rigidity.  Skin:    General: Skin is warm and dry.     Findings: No rash.  Neurological:     Mental Status: He is alert.     Comments: Alert, speech clear. Motor/sens grossly intact bil. Steady gait.   Psychiatric:     Comments: Alert, content. Pt randomly talks about one unrelated topic or issue then another - from too many Muslims on his street as being the problem, to living in many universes at once, to son committing him for no reason.      ED Results / Procedures / Treatments   Labs (all labs ordered are listed, but only abnormal results are displayed) Results for orders placed or performed during the hospital encounter of 12/07/20  Comprehensive metabolic panel  Result Value Ref Range   Sodium 138 135 - 145 mmol/L   Potassium 4.1 3.5 - 5.1 mmol/L   Chloride 106 98 - 111 mmol/L   CO2 25 22 - 32 mmol/L   Glucose, Bld 120 (H) 70 - 99 mg/dL   BUN 6 6 - 20 mg/dL   Creatinine, Ser 0.76 0.61 - 1.24 mg/dL   Calcium 10.0 8.9 -  10.3 mg/dL   Total Protein 7.0 6.5 - 8.1 g/dL   Albumin 4.1 3.5 - 5.0 g/dL   AST 33 15 - 41 U/L   ALT 25 0 - 44 U/L   Alkaline Phosphatase 47 38 - 126 U/L   Total Bilirubin 0.8 0.3 - 1.2 mg/dL   GFR, Estimated >60 >60 mL/min   Anion gap 7 5 - 15  Ethanol  Result Value Ref Range   Alcohol, Ethyl (B) <33 <82 mg/dL  Salicylate level  Result Value Ref Range   Salicylate Lvl <5.0 (L) 7.0 - 30.0 mg/dL  Acetaminophen level  Result Value Ref Range   Acetaminophen (Tylenol), Serum <10 (L) 10 - 30 ug/mL  cbc  Result Value Ref Range   WBC 7.4 4.0 - 10.5 K/uL   RBC 5.27 4.22 - 5.81 MIL/uL   Hemoglobin 15.7 13.0 - 17.0 g/dL   HCT 46.9 39.0 - 52.0 %   MCV 89.0 80.0 - 100.0 fL   MCH 29.8 26.0 - 34.0 pg   MCHC 33.5 30.0 - 36.0 g/dL   RDW 12.9 11.5 - 15.5 %   Platelets 223 150 - 400 K/uL   nRBC 0.0 0.0 - 0.2 %   EKG None  Radiology No results found.  Procedures Procedures   Medications Ordered in ED Medications - No data to display  ED Course  I have reviewed the triage vital signs and the nursing notes.  Pertinent labs & imaging results that were available during my care of the patient were reviewed by me and considered in my medical decision making (see chart for details).    MDM Rules/Calculators/A&P                         Labs sent.   Reviewed nursing notes and prior charts for additional history.   Labs reviewed/interpreted by me - chem normal. Wbc normal.  IVC reviewed - son indicates SI in IVC.   [place] team consulted.   .The patient has been placed in psychiatric observation due to the need to provide a safe environment for the patient while obtaining psychiatric consultation  and evaluation, as well as ongoing medical and medication management to treat the patient's condition.  The patient has been placed under full IVC at this time.  Disposition per [facility] team.      Final Clinical Impression(s) / ED Diagnoses Final diagnoses:  None    Rx / DC Orders ED  Discharge Orders    None       Lajean Saver, MD 12/07/20 [phone]

## 2020-12-07 NOTE — BH Assessment (Signed)
Comprehensive Clinical Assessment (CCA) Note  12/07/2020 [place] A Labella [id number]   Disposition: Dr. Dwyane Dee recommends in patient treatment. CSW to seek placement. RN notified of disposition.  The patient demonstrates the following risk factors for suicide: Chronic risk factors for suicide include: psychiatric disorder of schizophrenia and demographic factors (male, >52 y/o). Acute risk factors for suicide include: N/A. Protective factors for this patient include: positive social support and positive therapeutic relationship. Considering these factors, the overall suicide risk at this point appears to be moderate. Patient is not appropriate for outpatient follow up.  [facility] ED from 12/07/2020 in [facility] ED from 12/05/2020 in [facility] Admission (Discharged) from 01/16/2020 in [facility]  C-SSRS RISK CATEGORY No Risk Error: Question 1 not populated No Risk     Based on assessment and information gathered from IVC patient poses low risk for suicide and tele-sitter would be appropriate.    Patient is a 50 year old male presenting to [facility] ED under IVC via [employer]. Per EDP: "Patient with hx schizophrenia, presents via [employer] with IVC. Son indicates patient with agitated, argumentative behavior w son tonight, and that patient threatened suicide. In ED, pt very poor historian, states he is only here because he wants to 'move out of apartment b/c too many Muslims', and then talks about how he is 'living in multiple universes at once.' - level 5 caveat, psychiatric illness. Pt indicates compliant w meds. Denies overdose. Denies substance abuse. Denies acute physical health problems or symptoms."  Upon this counselor's exam patient is guarded and participates minimally in assessment. He is disorganized seems to be experiencing paranoid delusions. He states  "The police came to my house and brought me in.  Every time they do this they rob me." Patient denies SI/HI/AVH. He denies threatening suicide to his son. Patient states he has services with [facility] but he has not been taking his medications because "there is a poison in them similar to cyanide. It will kill you within 30 minutes."   Chief Complaint:  Chief Complaint  Patient presents with  . IVC   Visit Diagnosis: Schizophrenia (per history)   CCA Biopsychosocial Intake/Chief Complaint:  NA  Current Symptoms/Problems: NA   Patient Reported Schizophrenia/Schizoaffective Diagnosis in Past: Yes   Strengths: NA  Preferences: NA  Abilities: NA   Type of Services Patient Feels are Needed: NA   Initial Clinical Notes/Concerns: NA   Mental Health Symptoms Depression:  Hopelessness; Irritability; Worthlessness   Duration of Depressive symptoms: No data recorded  Mania:  None   Anxiety:   None   Psychosis:  Delusions; Affective flattening/alogia/avolition   Duration of Psychotic symptoms: Greater than six months   Trauma:  None   Obsessions:  None   Compulsions:  None   Inattention:  None   Hyperactivity/Impulsivity:  N/A   Oppositional/Defiant Behaviors:  None   Emotional Irregularity:  None   Other Mood/Personality Symptoms:  No data recorded   Mental Status Exam Appearance and self-care  Stature:  Average   Weight:  Average weight   Clothing:  Casual   Grooming:  Neglected   Cosmetic use:  Age appropriate   Posture/gait:  Slumped   Motor activity:  Restless   Sensorium  Attention:  Normal   Concentration:  Preoccupied; Scattered   Orientation:  Person; Place   Recall/memory:  Normal   Affect and Mood  Affect:  Full Range   Mood:  Negative   Relating  Eye contact:  Fleeting   Facial expression:  Constricted   Attitude toward examiner:  Uninterested   Thought and Language  Speech flow: Flight of Ideas   Thought content:  Appropriate to Mood and Circumstances; Ideas  of Reference   Preoccupation:  Other (Comment)   Hallucinations:  Other (Comment)   Organization:  No data recorded  [employer] of Knowledge:  Poor   Intelligence:  Average   Abstraction:  Normal   Judgement:  Poor   Reality Testing:  Unaware   Insight:  Lacking; Gaps   Decision Making:  Vacilates   Social Functioning  Social Maturity:  Irresponsible   Social Judgement:  Normal   Stress  Stressors:  Other (Comment)   Coping Ability:  Overwhelmed   Skill Deficits:  None   Supports:  Support needed     Religion: Religion/Spirituality Are You A Religious Person?: No  Leisure/Recreation: Leisure / Recreation Do You Have Hobbies?: No  Exercise/Diet: Exercise/Diet Do You Exercise?: No Have You Gained or Lost A Significant Amount of Weight in the Past Six Months?: No Do You Follow a Special Diet?: No Do You Have Any Trouble Sleeping?: No   CCA Employment/Education Employment/Work Situation: Employment / Work Situation Employment situation: On disability Why is patient on disability: schizophrenia How long has patient been on disability: UTA Patient's job has been impacted by current illness: No What is the longest time patient has a held a job?: 10 years 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 Has patient ever been in the [employer]?: No  Education: Education Is Patient Currently Attending School?: No 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) Name of High School: [employer] Did Teacher, adult education From [employer]?:  ([employer]A) Did You Attend College?:  (UTA) Did You Attend Graduate School?:  (UTA) Did You Have An Individualized Education Program (IIEP): No Did You Have Any Difficulty At School?: No Patient's Education Has Been Impacted by Current Illness: No   CCA Family/Childhood History Family and Relationship History: Family history Marital status: Single Are you sexually active?: No What is your sexual orientation?: NA Has your  sexual activity been affected by drugs, alcohol, medication, or emotional stress?: NA Does patient have children?: Yes  Childhood History:  Childhood History By whom was/is the patient raised?: Mother/father and step-parent,Mother Additional childhood history information: Pt reports being raised by mother and step father and reports step father was a "pot head".  Pt describes his childhood as good.  Description of patient's relationship with caregiver when they were a child: Pt reports getting along well with parents growing up.  Patient's description of current relationship with people who raised him/her: NA How were you disciplined when you got in trouble as a child/adolescent?: NA Does patient have siblings?: No Did patient suffer any verbal/emotional/physical/sexual abuse as a child?: No Did patient suffer from severe childhood neglect?: No Has patient ever been sexually abused/assaulted/raped as an adolescent or adult?: No Was the patient ever a victim of a crime or a disaster?: No Witnessed domestic violence?: No Has patient been affected by domestic violence as an adult?: No  Child/Adolescent Assessment:     CCA Substance Use Alcohol/Drug Use: Alcohol / Drug Use Pain Medications: See MAR Prescriptions: See MAR Over the Counter: See MAR History of alcohol / drug use?: Yes Longest period of sobriety (when/how long): 10 Years reported with no alcohol of marijuana use.  Negative Consequences of Use:  (Denies) Withdrawal Symptoms:  (denies)  ASAM's:  Six Dimensions of Multidimensional Assessment  Dimension 1:  Acute Intoxication and/or Withdrawal Potential:      Dimension 2:  Biomedical Conditions and Complications:      Dimension 3:  Emotional, Behavioral, or Cognitive Conditions and Complications:     Dimension 4:  Readiness to Change:     Dimension 5:  Relapse, Continued use, or Continued Problem Potential:     Dimension 6:   Recovery/Living Environment:     ASAM Severity Score:    ASAM Recommended Level of Treatment:     Substance use Disorder (SUD)    Recommendations for Services/Supports/Treatments:    DSM5 Diagnoses: Patient Active Problem List   Diagnosis Date Noted  . MDD (major depressive disorder), recurrent severe, without psychosis ([place]) 01/16/2020  . 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 Acute respiratory failure with hypoxia ([place])   . Polysubstance overdose 02/26/2019  . Laceration of Achilles tendon, right, initial encounter   . Shock ([place]) 08/28/2018  . Suicide attempt ([place])   . Benzodiazepine (tranquilizer) overdose, intentional self-harm, initial encounter ([place]) 06/13/2018  . Overdose of benzodiazepine, intentional self-harm, initial encounter ([place])   . Somnolence   . Schizophrenia, paranoid type ([place]) 08/19/2017  . Hypokalemia 04/28/2014  . Drug overdose 04/27/2014  . Schizoaffective disorder, bipolar type ([place]) 03/02/2014  . Anxiety state 03/02/2014  . Benzodiazepine dependence ([place]) 03/02/2014  . Chest pain 10/11/2013  . EKG abnormalities 10/11/2013  . Chronic back pain     Patient Centered Plan: Patient is on the following Treatment Plan(s):  Referrals to Alternative Service(s): Referred to Alternative Service(s):   Place:   Date:   Time:    Referred to Alternative Service(s):   Place:   Date:   Time:    Referred to Alternative Service(s):   Place:   Date:   Time:    Referred to Alternative Service(s):   Place:   Date:   Time:     Orvis Brill, LCSW
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
## 2020-12-08 DIAGNOSIS — F259 Schizoaffective disorder, unspecified: Secondary | ICD-10-CM | POA: Diagnosis not present

## 2020-12-08 MED ORDER — ACETAMINOPHEN 500 MG PO TABS
1000.0000 mg | ORAL_TABLET | Freq: Once | ORAL | Status: AC
  Administered 2020-12-08: 1000 mg via ORAL
  Filled 2020-12-08: qty 2

## 2020-12-08 NOTE — ED Notes (Signed)
Report given to Judithann Sauger, RN at [facility].

## 2020-12-08 NOTE — Discharge Instructions (Addendum)
Please proceed to [facility].

## 2020-12-08 NOTE — ED Notes (Signed)
Attempted to call sheriff's office again and got voicemail again. No numbers given in voicemail to call for after hours

## 2020-12-08 NOTE — ED Notes (Signed)
Regular lunch tray ordered 

## 2020-12-08 NOTE — ED Notes (Signed)
Called Sheriffs Department to arrange transport. No answer at this time. Voicemail left requesting transport. Camera operator Aware.
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
## 2020-12-08 NOTE — ED Notes (Signed)
Attempted to call [facility] to give report 3x. No answer from facility

## 2020-12-08 NOTE — ED Provider Notes (Signed)
5:01 PM Patient awake, alert, speaking clearly.  When informed that he will be transferred to [facility] behavioral health facility, he states that he is concerned they may treat him unwell, he may be turned into dog food.   Carmin Muskrat, MD 12/08/20 [phone]
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
## 2020-12-10 ENCOUNTER — Telehealth: Payer: Self-pay | Admitting: *Deleted

## 2020-12-10 NOTE — Telephone Encounter (Signed)
Pt attorney called regarding pt disposition as pt had court date today.  RNCM received permission from pt on previous admission to divulge any information to attorney Heron Sabins.  RNCM advised that pt was discharged on 4/2 to [facility].

## 2021-04-21 ENCOUNTER — Emergency Department (HOSPITAL_COMMUNITY)
Admission: EM | Admit: 2021-04-21 | Discharge: 2021-04-21 | Discharge status: Against Medical Advice | Payer: Medicare Other | Attending: Emergency Medicine | Admitting: Emergency Medicine

## 2021-04-21 ENCOUNTER — Emergency Department (HOSPITAL_COMMUNITY): Payer: Medicare Other

## 2021-04-21 ENCOUNTER — Encounter (HOSPITAL_COMMUNITY): Payer: Self-pay

## 2021-04-21 ENCOUNTER — Other Ambulatory Visit: Payer: Self-pay

## 2021-04-21 DIAGNOSIS — R0602 Shortness of breath: Secondary | ICD-10-CM | POA: Diagnosis present

## 2021-04-21 DIAGNOSIS — I509 Heart failure, unspecified: Secondary | ICD-10-CM | POA: Diagnosis not present

## 2021-04-21 DIAGNOSIS — J449 Chronic obstructive pulmonary disease, unspecified: Secondary | ICD-10-CM | POA: Insufficient documentation

## 2021-04-21 DIAGNOSIS — F17210 Nicotine dependence, cigarettes, uncomplicated: Secondary | ICD-10-CM | POA: Insufficient documentation

## 2021-04-21 DIAGNOSIS — J45909 Unspecified asthma, uncomplicated: Secondary | ICD-10-CM | POA: Insufficient documentation

## 2021-04-21 LAB — BASIC METABOLIC PANEL
Anion gap: 7 (ref 5–15)
BUN: 6 mg/dL (ref 6–20)
CO2: 26 mmol/L (ref 22–32)
Calcium: 9.7 mg/dL (ref 8.9–10.3)
Chloride: 107 mmol/L (ref 98–111)
Creatinine, Ser: 0.81 mg/dL (ref 0.61–1.24)
GFR, Estimated: >60 mL/min (ref >60)
Glucose, Bld: 92 mg/dL (ref 70–99)
Potassium: 3.7 mmol/L (ref 3.5–5.1)
Sodium: 140 mmol/L (ref 135–145)

## 2021-04-21 LAB — BRAIN NATRIURETIC PEPTIDE: B Natriuretic Peptide: 13 pg/mL (ref 0.0–100.0)

## 2021-04-21 LAB — CBC
HCT: 48.2 % (ref 39.0–52.0)
Hemoglobin: 16.2 g/dL (ref 13.0–17.0)
MCH: 29.3 pg (ref 26.0–34.0)
MCHC: 33.6 g/dL (ref 30.0–36.0)
MCV: 87.3 fL (ref 80.0–100.0)
Platelets: 218 10*3/uL (ref 150–400)
RBC: 5.52 MIL/uL (ref 4.22–5.81)
RDW: 11.7 % (ref 11.5–15.5)
WBC: 10.3 10*3/uL (ref 4.0–10.5)
nRBC: 0 % (ref 0.0–0.2)

## 2021-04-21 LAB — TROPONIN I (HIGH SENSITIVITY): Troponin I (High Sensitivity): 4 ng/L (ref <18)

## 2021-04-21 LAB — D-DIMER, QUANTITATIVE: D-Dimer, Quant: <0.27 ug/mL-FEU (ref 0.00–0.50)

## 2021-04-21 NOTE — ED Notes (Signed)
Pt came out of room and stated "I want to leave AMA" - RN encouraged pt to remain in ED to have MD come speak with him and wait for the remaining labs to result. Pt refused to wait on MD and refused to sign AMA paperwork. Pt appears to be in no distress, breathing is regular and unlabored. Pt seen ambulating independently out of ED. MD Pearline Cables made aware.

## 2021-04-21 NOTE — ED Triage Notes (Signed)
Pt presents to the ED for SOB which began three weeks ago. Pt denies any accompanying sx. Pt additionally has a hx of schizophrenia.

## 2021-04-21 NOTE — ED Provider Notes (Cosign Needed)
Emergency Medicine Provider Triage Evaluation Note  Mali A Browe , a 50 y.o. male  was evaluated in triage.  Pt complains of shortness of breath.  States he began a few weeks ago.  Denies any other symptoms at this time.  States that he smokes about 1 pack/day.  Physical Exam  BP 125/89 (BP Location: Left Arm)   Pulse 83   Temp 97.9 F (36.6 C) (Oral)   Resp 18   SpO2 98%  Gen:   Awake, no distress   Resp:  Normal effort  MSK:   Moves extremities without difficulty  Other:    Medical Decision Making  Medically screening exam initiated at 6:16 PM.  Appropriate orders placed.  Mali A Ferdig was informed that the remainder of the evaluation will be completed by another provider, this initial triage assessment does not replace that evaluation, and the importance of remaining in the ED until their evaluation is complete.   Rayna Sexton, PA-C 04/21/21 1817

## 2021-04-21 NOTE — ED Notes (Addendum)
Pt coming out of room multiple times wandering in the hallway. Pt told multiple times to stay in the room and use the call bell if he has concerns. Pt stating he cant breath and he needs oxygen. Pt appears to be in no distress, breathing is regular and unlabored and pt is able to speak in complete sentences. Pts o2sat remaining at 100% on room air. Pt in room yelling profanity and being rude to staff. MD Pearline Cables made aware.

## 2021-04-21 NOTE — ED Notes (Signed)
PA-C at the bedside.  ?

## 2021-04-21 NOTE — ED Provider Notes (Signed)
[facility] DEPT Provider Note   CSN: [id number] Arrival date & time: 04/21/21  1751     History Chief Complaint  Patient presents with   Shortness of Breath    [name] is a 50 y.o. male.  Patient is a 50 yo male with pmh of schizophrenia, copd, and active smoker presenting for complaints of sob. Patient admits to sob x 2 months. Denies any worsening or alleviating factors.  Denies fevers, chills, nausea, vomiting, or coughing. Denies hx of DVT/PE, leg swelling, or calf tenderness. PMH of CHF but denies orthopnea, coughing, or lower extremity swelling.   The history is provided by the patient. No language interpreter was used.  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) 06/13/2018   Overdose of benzodiazepine, intentional self-harm, initial encounter ([place])    Somnolence    Schizophrenia, paranoid type ([place]) 08/19/2017   Hypokalemia 04/28/2014   Drug overdose 04/27/2014   Schizoaffective disorder, bipolar type ([place]) 03/02/2014   Anxiety state  03/02/2014   Benzodiazepine dependence ([place]) 03/02/2014   Chest pain 10/11/2013   EKG abnormalities 10/11/2013   Chronic back pain     Past Surgical History:  Procedure Laterality Date   KNEE SURGERY     four times   WISDOM TOOTH EXTRACTION         Family History  Problem Relation Age of Onset   Hypertension Father     Social History   Tobacco Use   Smoking status: Every Day    Packs/day: 1.00    Years: 33.00    Pack years: 33.00    Types: Cigarettes   Smokeless tobacco: Never  Vaping Use   Vaping Use: Never used  Substance Use Topics   Alcohol use: No    Comment: occ   Drug use: No    Comment: Pt denied    Home Medications Prior to Admission medications   Medication Sig Start Date End Date Taking? Authorizing Provider  acetaminophen (TYLENOL) 325 MG tablet Take 650 mg by mouth every 6 (six) hours as needed for mild pain, fever or headache.    [provider]  albuterol (VENTOLIN HFA) 108 (90 Base) MCG/ACT inhaler Inhale 2 puffs into the lungs every 4 (four) hours as needed for wheezing or shortness of breath. 09/02/19   Varney Biles, MD  ALPRAZolam [name]) 1 MG tablet Take 1 tablet (1 mg total) by mouth daily as needed for anxiety. For anxiety 02/02/20   Lindell Spar I, NP  diclofenac sodium (VOLTAREN) 1 % GEL Apply 4  g topically 4 (four) times daily as needed. Patient not taking: No sig reported 10/22/18   Hilts, Michael, MD  diphenhydrAMINE (BENADRYL) 25 MG tablet Take 25 mg by mouth every 6 (six) hours as needed for allergies.    [provider]  divalproex (DEPAKOTE) 500 MG DR tablet Take 1 tablet (500 mg) by mouth in the morning & 2 tablets (1,000 mg) at bedtime: For mood stabilization Patient not taking: No sig reported 02/02/20   Lindell Spar I, NP  Fluticasone-Salmeterol (ADVAIR) 100-50 MCG/DOSE AEPB Inhale 1 puff into the lungs in the morning and at bedtime. Patient not taking: Reported on 12/07/2020 11/20/20   [provider]   gabapentin (NEURONTIN) 400 MG capsule Take 1 capsule (400 mg total) by mouth 3 (three) times daily. For agitation Patient not taking: No sig reported 02/02/20   Lindell Spar I, NP  HYDROcodone-acetaminophen (NORCO) 10-325 MG tablet Take 1 tablet by mouth 3 (three) times daily as needed for pain. 06/05/20   [provider]  naloxone [facility]) nasal spray 4 mg/0.1 mL Place 1 spray into the nose as needed (overdose).    [provider]  nicotine polacrilex (NICORETTE) 2 MG gum Take 1 each (2 mg total) by mouth as needed for smoking cessation. Patient not taking: No sig reported 02/02/20   Lindell Spar I, NP  OLANZapine zydis (ZYPREXA) 20 MG disintegrating tablet Take 1 tablet (20 mg total) by mouth at bedtime. For mood control Patient not taking: No sig reported 02/02/20   Lindell Spar I, NP  paliperidone (INVEGA SUSTENNA) 156 MG/ML SUSY injection Inject 156 mg into the muscle See admin instructions. Q 28 days    [provider]    Allergies    Amoxicillin, Dextromethorphan-guaifenesin, Haloperidol, Haloperidol lactate, Meloxicam, Nsaids, Quetiapine, Sudafed [pseudoephedrine hcl], Ziprasidone, and Prednisone  Review of Systems   Review of Systems  Constitutional:  Negative for chills and fever.  HENT:  Negative for ear pain and sore throat.   Eyes:  Negative for pain and visual disturbance.  Respiratory:  Positive for shortness of breath. Negative for cough.   Cardiovascular:  Negative for chest pain and palpitations.  Gastrointestinal:  Negative for abdominal pain and vomiting.  Genitourinary:  Negative for dysuria and hematuria.  Musculoskeletal:  Negative for arthralgias and back pain.  Skin:  Negative for color change and rash.  Neurological:  Negative for seizures and syncope.  All other systems reviewed and are negative.  Physical Exam Updated Vital Signs BP 112/62 (BP Location: Left Arm)   Pulse 61   Temp 98 F (36.7 C) (Oral)   Resp 17   SpO2 99%    Physical Exam Vitals and nursing note reviewed.  Constitutional:      Appearance: He is well-developed.  HENT:     Head: Normocephalic and atraumatic.  Eyes:     Conjunctiva/sclera: Conjunctivae normal.  Cardiovascular:     Rate and Rhythm: Normal rate and regular rhythm.     Heart sounds: No murmur heard. Pulmonary:     Effort: Pulmonary effort is normal. No respiratory distress.     Breath sounds: Normal breath sounds.  Abdominal:     Palpations: Abdomen is soft.     Tenderness: There is no abdominal tenderness.  Musculoskeletal:     Cervical back: Neck supple.  Skin:    General: Skin is warm and dry.  Neurological:     Mental Status: He is alert.    ED Results / Procedures / Treatments  Labs (all labs ordered are listed, but only abnormal results are displayed) Labs Reviewed  BASIC METABOLIC PANEL  CBC  BRAIN NATRIURETIC PEPTIDE  D-DIMER, QUANTITATIVE  TROPONIN I (HIGH SENSITIVITY)  TROPONIN I (HIGH SENSITIVITY)    EKG EKG Interpretation  Date/Time:  'Sunday April 21 2021 19:42:10 EDT Ventricular Rate:  55 PR Interval:  130 QRS Duration: 92 QT Interval:  446 QTC Calculation: 426 R Axis:   86 Text Interpretation: Sinus bradycardia Incomplete right bundle branch block Borderline ECG no acute ischemia Confirmed by Wright, Anna ([id number]) on 04/22/2021 9:43:20 AM  Radiology DG Chest 2 View  Result Date: 04/21/2021 CLINICAL DATA:  Shortness of breath. EXAM: CHEST - 2 VIEW COMPARISON:  10/24/2020 FINDINGS: The cardiomediastinal contours are normal. The lungs are clear. Pulmonary vasculature is normal. No consolidation, pleural effusion, or pneumothorax. Slightly exaggerated thoracic kyphosis. No acute osseous abnormalities are seen. IMPRESSION: No acute chest findings. Electronically Signed   By: Melanie  Sanford M.D.   On: 04/21/2021 18:43    Procedures Procedures   Medications Ordered in ED Medications - No data to display  ED Course  I have reviewed the  triage vital signs and the nursing notes.  Pertinent labs & imaging results that were available during my care of the patient were reviewed by me and considered in my medical decision making (see chart for details).    MDM Rules/Calculators/A&P                          11'$ :44 AM  49 yo male with pmh of schizophrenia, copd, and active smoker presenting for complaints of sob. Patient is Aox3, no acute distress, afebrile, with stable vitals.   The patient's chest pain is not suggestive of pulmonary embolus, cardiac ischemia, aortic dissection, pericarditis, myocarditis, pulmonary embolism, pneumothorax, pneumonia, Zoster, or esophageal perforation, or other serious etiology.  Historically not abrupt in onset, tearing or ripping, pulses symmetric. EKG nonspecific for ischemia/infarction. No dysrhythmias, brugada, WPW, prolonged QT noted. CXR reviewed and WNL.   Notified by nursing personal, patient stated "I am out of here" and was seen leaving the ED prior to completion of care. Labs drawn not yet resulted.                Final Clinical Impression(s) / ED Diagnoses Final diagnoses:  SOB (shortness of breath)    Rx / DC Orders ED Discharge Orders     None        Lianne Cure, DO [id number]

## 2021-04-21 NOTE — ED Notes (Signed)
Pt ambulated with steady gait. O2 sat remained 100% throughout ambulation.

## 2021-04-21 NOTE — ED Notes (Signed)
ED Provider at bedside. 

## 2021-04-27 ENCOUNTER — Emergency Department (HOSPITAL_COMMUNITY)
Admission: EM | Admit: 2021-04-27 | Discharge: 2021-04-28 | Disposition: A | Discharge status: Home / Self Care | Payer: Medicare Other | Attending: Emergency Medicine | Admitting: Emergency Medicine

## 2021-04-27 ENCOUNTER — Emergency Department (HOSPITAL_COMMUNITY): Payer: Medicare Other

## 2021-04-27 ENCOUNTER — Other Ambulatory Visit: Payer: Self-pay

## 2021-04-27 DIAGNOSIS — Y900 Blood alcohol level of less than 20 mg/100 ml: Secondary | ICD-10-CM | POA: Insufficient documentation

## 2021-04-27 DIAGNOSIS — J449 Chronic obstructive pulmonary disease, unspecified: Secondary | ICD-10-CM | POA: Diagnosis not present

## 2021-04-27 DIAGNOSIS — F17210 Nicotine dependence, cigarettes, uncomplicated: Secondary | ICD-10-CM | POA: Insufficient documentation

## 2021-04-27 DIAGNOSIS — R0600 Dyspnea, unspecified: Secondary | ICD-10-CM | POA: Diagnosis present

## 2021-04-27 DIAGNOSIS — Z79899 Other long term (current) drug therapy: Secondary | ICD-10-CM | POA: Insufficient documentation

## 2021-04-27 DIAGNOSIS — Z20822 Contact with and (suspected) exposure to covid-19: Secondary | ICD-10-CM | POA: Diagnosis not present

## 2021-04-27 DIAGNOSIS — J45909 Unspecified asthma, uncomplicated: Secondary | ICD-10-CM | POA: Diagnosis not present

## 2021-04-27 DIAGNOSIS — I509 Heart failure, unspecified: Secondary | ICD-10-CM | POA: Insufficient documentation

## 2021-04-27 LAB — COMPREHENSIVE METABOLIC PANEL
ALT: 22 U/L (ref 0–44)
AST: 20 U/L (ref 15–41)
Albumin: 3.9 g/dL (ref 3.5–5.0)
Alkaline Phosphatase: 47 U/L (ref 38–126)
Anion gap: 6 (ref 5–15)
BUN: <5 mg/dL — ABNORMAL LOW (ref 6–20)
CO2: 26 mmol/L (ref 22–32)
Calcium: 9.6 mg/dL (ref 8.9–10.3)
Chloride: 108 mmol/L (ref 98–111)
Creatinine, Ser: 0.88 mg/dL (ref 0.61–1.24)
GFR, Estimated: >60 mL/min (ref >60)
Glucose, Bld: 102 mg/dL — ABNORMAL HIGH (ref 70–99)
Potassium: 3.5 mmol/L (ref 3.5–5.1)
Sodium: 140 mmol/L (ref 135–145)
Total Bilirubin: 0.6 mg/dL (ref 0.3–1.2)
Total Protein: 6.5 g/dL (ref 6.5–8.1)

## 2021-04-27 LAB — RESP PANEL BY RT-PCR (FLU A&B, COVID) ARPGX2
Influenza A by PCR: NEGATIVE
Influenza B by PCR: NEGATIVE
SARS Coronavirus 2 by RT PCR: NEGATIVE

## 2021-04-27 LAB — CBC WITH DIFFERENTIAL/PLATELET
Abs Immature Granulocytes: 0.02 10*3/uL (ref 0.00–0.07)
Basophils Absolute: 0 10*3/uL (ref 0.0–0.1)
Basophils Relative: 1 %
Eosinophils Absolute: 0.1 10*3/uL (ref 0.0–0.5)
Eosinophils Relative: 1 %
HCT: 44.1 % (ref 39.0–52.0)
Hemoglobin: 14.7 g/dL (ref 13.0–17.0)
Immature Granulocytes: 0 %
Lymphocytes Relative: 22 %
Lymphs Abs: 1.9 10*3/uL (ref 0.7–4.0)
MCH: 29.8 pg (ref 26.0–34.0)
MCHC: 33.3 g/dL (ref 30.0–36.0)
MCV: 89.3 fL (ref 80.0–100.0)
Monocytes Absolute: 0.7 10*3/uL (ref 0.1–1.0)
Monocytes Relative: 8 %
Neutro Abs: 5.7 10*3/uL (ref 1.7–7.7)
Neutrophils Relative %: 68 %
Platelets: 195 10*3/uL (ref 150–400)
RBC: 4.94 MIL/uL (ref 4.22–5.81)
RDW: 11.8 % (ref 11.5–15.5)
WBC: 8.3 10*3/uL (ref 4.0–10.5)
nRBC: 0 % (ref 0.0–0.2)

## 2021-04-27 LAB — ACETAMINOPHEN LEVEL: Acetaminophen (Tylenol), Serum: <10 ug/mL — ABNORMAL LOW (ref 10–30)

## 2021-04-27 LAB — TROPONIN I (HIGH SENSITIVITY): Troponin I (High Sensitivity): 4 ng/L (ref <18)

## 2021-04-27 LAB — SALICYLATE LEVEL: Salicylate Lvl: <7 mg/dL — ABNORMAL LOW (ref 7.0–30.0)

## 2021-04-27 LAB — ETHANOL: Alcohol, Ethyl (B): <10 mg/dL (ref <10)

## 2021-04-27 NOTE — ED Triage Notes (Signed)
Pt brought to ED with c/o SOB. Per EMS, pt presented in no distress and was displaying odd mannerisms and paranoia, requesting that crew take him to [place] for "full body scan" and homeland security. Upon arrival to ED, no respiratory distress noted, all lung sounds are clear.

## 2021-04-27 NOTE — ED Provider Notes (Signed)
[facility] EMERGENCY DEPARTMENT Provider Note   CSN: [id number] Arrival date & time: 04/27/21  2042     History Chief Complaint  Patient presents with   Psychiatric Evaluation    [name] is a 50 y.o. male.  HPI 50 year old male presents with a chief complaint of dyspnea and chest pain.  He is very vague on when his symptoms started.  He cannot tell me when his shortness of breath started but states it feels like he stops breathing.  He is also having chest pain and tightness but that has currently resolved.  When asked when the started he just keeps saying that he does not know.  He also told the nurse that he feels like he needs a "full body scan".  He tells me that he needs a "brain x-ray" because he feels like he has a scar on the top of his head that he can feel but not see and has been there for a year or so.  He denies a current headache but he has on and off headaches.  No suicidal thoughts but he is concerned that there is something wrong with the cigarettes and wants me to test them when I test his blood.  He is worried that the place where he buys the cigarettes is doing something to switch them out. He denies suicidal thoughts.   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) 08/28/2018   Suicide attempt ([place])    Benzodiazepine (tranquilizer) overdose, intentional self-harm, initial encounter ([place]) 06/13/2018    Overdose of benzodiazepine, intentional self-harm, initial encounter ([place])    Somnolence    Schizophrenia, paranoid type ([place]) 08/19/2017   Hypokalemia 04/28/2014   Drug overdose 04/27/2014   Schizoaffective disorder, bipolar type ([place]) 03/02/2014   Anxiety state 03/02/2014   Benzodiazepine dependence ([place]) 03/02/2014   Chest pain 10/11/2013   EKG abnormalities 10/11/2013   Chronic back pain     Past Surgical History:  Procedure Laterality Date   KNEE SURGERY     four times   WISDOM TOOTH EXTRACTION         Family History  Problem Relation Age of Onset   Hypertension Father     Social History   Tobacco Use   Smoking status: Every Day    Packs/day: 1.00    Years: 33.00    Pack years: 33.00    Types: Cigarettes   Smokeless tobacco: Never  Vaping Use   Vaping Use: Never used  Substance Use Topics   Alcohol use: No    Comment: occ   Drug use: No    Comment: Pt denied    Home Medications Prior to Admission medications   Medication Sig Start Date End Date Taking? Authorizing Provider  acetaminophen (TYLENOL) 325 MG tablet Take 650 mg by mouth every 6 (six) hours as needed for mild pain, fever or headache.    [provider]  albuterol (VENTOLIN HFA) 108 (90 Base) MCG/ACT inhaler Inhale 2 puffs into the lungs every 4 (four) hours as needed for wheezing or shortness of breath. 09/02/19   Varney Biles, MD  ALPRAZolam [name]) 1 MG tablet Take 1 tablet (1 mg total) by mouth daily as needed for anxiety. For anxiety 02/02/20   Lindell Spar I, NP  diclofenac sodium (VOLTAREN) 1 % GEL Apply 4 g topically 4 (four) times daily as needed. Patient not taking: No sig reported 10/22/18   Hilts, Michael, MD  diphenhydrAMINE (BENADRYL) 25 MG tablet Take 25 mg by mouth every 6 (six) hours as needed for allergies.    [provider]  divalproex (DEPAKOTE) 500 MG DR tablet Take 1 tablet (500 mg) by mouth in the morning & 2 tablets (1,000 mg) at bedtime: For mood  stabilization Patient not taking: No sig reported 02/02/20   Lindell Spar I, NP  Fluticasone-Salmeterol (ADVAIR) 100-50 MCG/DOSE AEPB Inhale 1 puff into the lungs in the morning and at bedtime. Patient not taking: Reported on 12/07/2020 11/20/20   [provider]  gabapentin (NEURONTIN) 400 MG capsule Take 1 capsule (400 mg total) by mouth 3 (three) times daily. For agitation Patient not taking: No sig reported 02/02/20   Lindell Spar I, NP  HYDROcodone-acetaminophen (NORCO) 10-325 MG tablet Take 1 tablet by mouth 3 (three) times daily as needed for pain. 06/05/20   [provider]  naloxone [facility]) nasal spray 4 mg/0.1 mL Place 1 spray into the nose as needed (overdose).    [provider]  nicotine polacrilex (NICORETTE) 2 MG gum Take 1 each (2 mg total) by mouth as needed for smoking cessation. Patient not taking: No sig reported 02/02/20   Lindell Spar I, NP  OLANZapine zydis (ZYPREXA) 20 MG disintegrating tablet Take 1 tablet (20 mg total) by mouth at bedtime. For mood control Patient not taking: No sig reported 02/02/20   Lindell Spar I, NP  paliperidone (INVEGA SUSTENNA) 156 MG/ML SUSY injection Inject 156 mg into the muscle See admin instructions. Q 28 days    [provider]    Allergies    Amoxicillin, Dextromethorphan-guaifenesin, Haloperidol, Haloperidol lactate, Meloxicam, Nsaids, Quetiapine, Sudafed [pseudoephedrine hcl], Ziprasidone, and Prednisone  Review of Systems   Review of Systems  Constitutional:  Negative for fever.  Respiratory:  Positive for shortness of breath.   Cardiovascular:  Positive for chest pain.  Neurological:  Negative for headaches.  Psychiatric/Behavioral:  Negative for suicidal ideas.   All other systems reviewed and are negative.  Physical Exam Updated Vital Signs BP 130/79 (BP Location: Left Arm)   Pulse 78   Temp 99.4 F (37.4 C) (Oral)   Resp 18   Ht '6\' 1"'$  (1.854 m)   Wt 79.4 kg   SpO2 98%   BMI 23.09 kg/m    Physical Exam Vitals and nursing note reviewed.  Constitutional:      Appearance: He is well-developed.  HENT:     Head: Normocephalic and atraumatic.     Comments: No obvious scalp abnormality    Right Ear: External ear normal.     Left Ear: External ear normal.     Nose: Nose normal.  Eyes:     General:        Right eye: No discharge.        Left eye: No discharge.  Cardiovascular:     Rate and Rhythm: Normal rate and regular rhythm.     Heart sounds: Normal heart sounds.  Pulmonary:     Effort: Pulmonary effort is normal.     Breath sounds: Normal breath sounds.  Abdominal:     Palpations: Abdomen is soft.     Tenderness: There is no abdominal tenderness.  Musculoskeletal:     Cervical back: Neck supple.  Skin:    General: Skin is warm and dry.  Neurological:     Mental Status: He is alert and oriented to person, place, and time.     Comments: CN 3-12 grossly intact. 5/5 strength in all 4 extremities. Grossly normal sensation. Normal finger to nose.   Psychiatric:        Mood and Affect: Mood is not anxious.     Comments: No suicidal thoughts    ED Results / Procedures / Treatments   Labs (all labs ordered are listed, but only abnormal results are displayed) Labs Reviewed  RESP PANEL BY RT-PCR (FLU A&B, COVID) [id number]  CBC WITH DIFFERENTIAL/PLATELET  COMPREHENSIVE METABOLIC PANEL  ACETAMINOPHEN LEVEL  ETHANOL  SALICYLATE LEVEL  TROPONIN I (HIGH SENSITIVITY)  TROPONIN I (HIGH SENSITIVITY)    EKG EKG Interpretation  Date/Time:  Saturday April 27 2021 22:32:57 EDT Ventricular Rate:  56 PR Interval:  134 QRS Duration: 94 QT Interval:  438 QTC Calculation: 422 R Axis:   81 Text Interpretation: Sinus bradycardia Incomplete right bundle branch block Septal infarct , age undetermined  no acute ST/T changes Confirmed by Sherwood Gambler [phone]) on 04/27/2021 11:20:24 PM  Radiology DG Chest 2 View  Result Date: 04/27/2021 CLINICAL DATA:  Chest tightness,  dyspnea EXAM: CHEST - 2 VIEW COMPARISON:  04/21/2021 FINDINGS: The heart size and mediastinal contours are within normal limits. Both lungs are clear. The visualized skeletal structures are unremarkable. IMPRESSION: No active cardiopulmonary disease. Electronically Signed   By: Fidela Salisbury M.D.   On: 04/27/2021 21:24    Procedures Procedures   Medications Ordered in ED Medications - No data to display  ED Course  I have reviewed the triage vital signs and the nursing notes.  Pertinent labs & imaging results that were available during my care of the patient were reviewed by me and considered in my medical decision making (see chart for details).    MDM Rules/Calculators/A&P                           Patient complains of a chief complaint of shortness of breath but it seems more that he is paranoid and acting odd.  However he is not acutely psychotic and he is not altered.  His neuro exam is unremarkable.  He is not currently having a headache that does have on and off headaches.  Highly doubt acute infection, especially CNS infection.  At this point, will rule out acute heart attack, pneumonia, COVID infection.  If his labs and work-up are unremarkable I think he can be discharged.  I did discuss getting a psychiatry consult but he declines and he is not at a point where he needs to be involuntarily committed at this point. No indication for CNS imaging. Care to Dr. Stark Jock. Final Clinical Impression(s) / ED Diagnoses Final diagnoses:  None    Rx / DC Orders ED Discharge Orders     None        Sherwood Gambler, MD 04/27/21 2322
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
## 2021-04-28 NOTE — Discharge Instructions (Addendum)
Follow-up with your primary doctor in the next week if symptoms are not improving.

## 2021-05-27 ENCOUNTER — Ambulatory Visit (HOSPITAL_BASED_OUTPATIENT_CLINIC_OR_DEPARTMENT_OTHER): Payer: Medicare Other | Admitting: Orthopaedic Surgery

## 2021-05-29 ENCOUNTER — Other Ambulatory Visit: Payer: Self-pay

## 2021-05-29 ENCOUNTER — Encounter (HOSPITAL_COMMUNITY): Payer: Self-pay

## 2021-05-29 ENCOUNTER — Ambulatory Visit (HOSPITAL_COMMUNITY)
Admission: EM | Admit: 2021-05-29 | Discharge: 2021-05-29 | Disposition: A | Discharge status: Home / Self Care | Payer: Medicare Other | Attending: Internal Medicine | Admitting: Internal Medicine

## 2021-05-29 DIAGNOSIS — M791 Myalgia, unspecified site: Secondary | ICD-10-CM | POA: Diagnosis not present

## 2021-05-29 DIAGNOSIS — L739 Follicular disorder, unspecified: Secondary | ICD-10-CM

## 2021-05-29 MED ORDER — DOXYCYCLINE HYCLATE 100 MG PO CAPS: 100.0000 mg | ORAL_CAPSULE | Freq: Two times a day (BID) | ORAL | 0 refills | Status: DC

## 2021-05-29 NOTE — ED Provider Notes (Signed)
[facility]    CSN: [id number] Arrival date & time: 05/29/21  1813      History   Chief Complaint Chief Complaint  Patient presents with   Generalized Body Aches    HPI [name] is a 50 y.o. male who presents with his mother, pain in different areas of his body, like head, neck, shoulders, back, both legs and ankles for months and used to take Norco for this and has a rash on his scalp for the past few weeks. 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   Acute respiratory failure with hypoxia ([place])    Polysubstance overdose 02/26/2019   Laceration of Achilles tendon, right, initial encounter    Shock ([place]) 08/28/2018   Suicide attempt ([place])    Benzodiazepine (tranquilizer) overdose, intentional self-harm, initial encounter ([place]) 06/13/2018   Overdose of benzodiazepine, intentional self-harm, initial encounter ([place])    Somnolence    Schizophrenia, paranoid type ([place]) 08/19/2017   Hypokalemia 04/28/2014   Drug overdose 04/27/2014   Schizoaffective disorder, bipolar type ([place]) 03/02/2014   Anxiety state 03/02/2014   Benzodiazepine dependence ([place]) 03/02/2014   Chest pain 10/11/2013   EKG abnormalities 10/11/2013   Chronic back pain     Past Surgical History:  Procedure Laterality Date   KNEE SURGERY     four times   WISDOM TOOTH EXTRACTION         Home Medications    Prior to Admission medications   Medication Sig Start Date End Date Taking? Authorizing Provider  doxycycline (VIBRAMYCIN) 100 MG capsule Take 1 capsule (100 mg  total) by mouth 2 (two) times daily. 05/29/21  Yes Rodriguez-Southworth, Sunday Spillers, PA-C  acetaminophen (TYLENOL) 325 MG tablet Take 650 mg by mouth every 6 (six) hours as needed for mild pain, fever or headache.    [provider]  albuterol (VENTOLIN HFA) 108 (90 Base) MCG/ACT inhaler Inhale 2 puffs into the lungs every 4 (four) hours as needed for wheezing or shortness of breath. 09/02/19   Varney Biles, MD  ALPRAZolam [name]) 1 MG tablet Take 1 tablet (1 mg total) by mouth daily as needed for anxiety. For anxiety 02/02/20   Lindell Spar I, NP  diphenhydrAMINE (BENADRYL) 25 MG tablet Take 25 mg by mouth every 6 (six) hours as needed for allergies.    [provider]  HYDROcodone-acetaminophen (NORCO) 10-325 MG tablet Take 1 tablet by mouth 3 (three) times daily as needed for pain. 06/05/20   [provider]  naloxone [facility]) nasal spray 4 mg/0.1 mL Place 1 spray into the nose as needed (overdose).    [provider]    Family History Family History  Problem Relation Age of Onset   Hypertension Father     Social History Social History   Tobacco Use   Smoking status: Every Day    Packs/day: 1.00    Years: 33.00    Pack years: 33.00    Types: Cigarettes   Smokeless tobacco: Never  Vaping Use  Vaping Use: Never used  Substance Use Topics   Alcohol use: No    Comment: occ   Drug use: No    Comment: Pt denied     Allergies   Amoxicillin, Dextromethorphan-guaifenesin, Haloperidol, Haloperidol lactate, Meloxicam, Nsaids, Quetiapine, Sudafed [pseudoephedrine hcl], Ziprasidone, and Prednisone   Review of Systems Review of Systems Has chronic Back pain and joints pains  and rash. The rest is negative.   Physical Exam Triage Vital Signs ED Triage Vitals  Enc Vitals Group     BP 05/29/21 1911 118/89     Pulse Rate 05/29/21 1911 95     Resp 05/29/21 1911 18     Temp 05/29/21 1911 99.5 F (37.5 C)     Temp Source 05/29/21 1911 Oral     SpO2  05/29/21 1911 99 %     Weight --      Height --      Head Circumference --      Peak Flow --      Pain Score 05/29/21 1917 7     Pain Loc --      Pain Edu? --      Excl. in [place]? --    No data found.  Updated Vital Signs BP 118/89 (BP Location: Left Arm)   Pulse 95   Temp 99.5 F (37.5 C) (Oral)   Resp 18   SpO2 99%   Visual Acuity Right Eye Distance:   Left Eye Distance:   Bilateral Distance:    Right Eye Near:   Left Eye Near:    Bilateral Near:     Physical Exam Constitutional:      General: He is not in acute distress.    Appearance: He is not toxic-appearing.  HENT:     Head:     Comments: Scalp with several scabbed areas and few pustules. Has a 1 cm x 1 cm open area where he just pulled a scab while in the room, but is not bleeding.     Right Ear: External ear normal.     Left Ear: External ear normal.     Nose: No congestion.  Eyes:     General: No scleral icterus.    Conjunctiva/sclera: Conjunctivae normal.  Neck:     Comments: Trapezius is sore and tense Pulmonary:     Effort: Pulmonary effort is normal.  Musculoskeletal:        General: Normal range of motion.     Cervical back: Neck supple.     Comments: BACK- normal ROM with some pain  Skin:    General: Skin is warm and dry.     Findings: No bruising, erythema or rash.  Neurological:     Mental Status: He is alert and oriented to person, place, and time.     Gait: Gait normal.     Deep Tendon Reflexes: Reflexes normal.  Psychiatric:        Mood and Affect: Mood normal.        Behavior: Behavior normal.        Thought Content: Thought content normal.        Judgment: Judgment normal.   UC Treatments / Results  Labs (all labs ordered are listed, but only abnormal results are displayed) Labs Reviewed - No data to display  EKG   Radiology No results found.  Procedures Procedures (including critical care time)  Medications Ordered in UC Medications - No data to display  Initial  Impression / Assessment and Plan /  UC Course  I have reviewed the triage vital signs and the nursing notes. I placed him on Doxy as noted. Advised to FU with ortho for his multiple arthralgias.      Final Clinical Impressions(s) / UC Diagnoses   Final diagnoses:  Folliculitis  Myalgia   Discharge Instructions   None    ED Prescriptions     Medication Sig Dispense Auth. Provider   doxycycline (VIBRAMYCIN) 100 MG capsule Take 1 capsule (100 mg total) by mouth 2 (two) times daily. 20 capsule Rodriguez-Southworth, Sunday Spillers, PA-C      PDMP not reviewed this encounter.   Shelby Mattocks, Hershal Coria 05/29/21 2043

## 2021-05-29 NOTE — ED Triage Notes (Signed)
Pt presents with pain in multiple areas of his body ( head, neck, shoulders, back, both legs, and ankle)

## 2021-06-05 DIAGNOSIS — M546 Pain in thoracic spine: Secondary | ICD-10-CM | POA: Insufficient documentation

## 2021-06-18 DIAGNOSIS — Z23 Encounter for immunization: Secondary | ICD-10-CM | POA: Insufficient documentation

## 2021-06-27 ENCOUNTER — Encounter (HOSPITAL_COMMUNITY): Payer: Self-pay | Admitting: Emergency Medicine

## 2021-06-27 ENCOUNTER — Other Ambulatory Visit: Payer: Self-pay

## 2021-06-27 ENCOUNTER — Emergency Department (HOSPITAL_COMMUNITY)
Admission: EM | Admit: 2021-06-27 | Discharge: 2021-06-27 | Discharge status: Against Medical Advice | Payer: Medicare Other | Attending: Emergency Medicine | Admitting: Emergency Medicine

## 2021-06-27 DIAGNOSIS — R519 Headache, unspecified: Secondary | ICD-10-CM | POA: Insufficient documentation

## 2021-06-27 DIAGNOSIS — Z5321 Procedure and treatment not carried out due to patient leaving prior to being seen by health care provider: Secondary | ICD-10-CM | POA: Insufficient documentation

## 2021-06-27 LAB — COMPREHENSIVE METABOLIC PANEL
ALT: 20 U/L (ref 0–44)
AST: 27 U/L (ref 15–41)
Albumin: 3.8 g/dL (ref 3.5–5.0)
Alkaline Phosphatase: 50 U/L (ref 38–126)
Anion gap: 12 (ref 5–15)
BUN: 5 mg/dL — ABNORMAL LOW (ref 6–20)
CO2: 22 mmol/L (ref 22–32)
Calcium: 9.5 mg/dL (ref 8.9–10.3)
Chloride: 106 mmol/L (ref 98–111)
Creatinine, Ser: 0.99 mg/dL (ref 0.61–1.24)
GFR, Estimated: >60 mL/min (ref >60)
Glucose, Bld: 164 mg/dL — ABNORMAL HIGH (ref 70–99)
Potassium: 3.6 mmol/L (ref 3.5–5.1)
Sodium: 140 mmol/L (ref 135–145)
Total Bilirubin: 0.5 mg/dL (ref 0.3–1.2)
Total Protein: 6.6 g/dL (ref 6.5–8.1)

## 2021-06-27 LAB — URINALYSIS, ROUTINE W REFLEX MICROSCOPIC
Bilirubin Urine: NEGATIVE
Glucose, UA: NEGATIVE mg/dL
Hgb urine dipstick: NEGATIVE
Ketones, ur: NEGATIVE mg/dL
Leukocytes,Ua: NEGATIVE
Nitrite: NEGATIVE
Protein, ur: NEGATIVE mg/dL
Specific Gravity, Urine: 1.009 (ref 1.005–1.030)
pH: 6 (ref 5.0–8.0)

## 2021-06-27 LAB — CBC WITH DIFFERENTIAL/PLATELET
Abs Immature Granulocytes: 0.03 10*3/uL (ref 0.00–0.07)
Basophils Absolute: 0.1 10*3/uL (ref 0.0–0.1)
Basophils Relative: 1 %
Eosinophils Absolute: 0.1 10*3/uL (ref 0.0–0.5)
Eosinophils Relative: 1 %
HCT: 44.9 % (ref 39.0–52.0)
Hemoglobin: 14.5 g/dL (ref 13.0–17.0)
Immature Granulocytes: 0 %
Lymphocytes Relative: 24 %
Lymphs Abs: 2.5 10*3/uL (ref 0.7–4.0)
MCH: 29.3 pg (ref 26.0–34.0)
MCHC: 32.3 g/dL (ref 30.0–36.0)
MCV: 90.7 fL (ref 80.0–100.0)
Monocytes Absolute: 0.6 10*3/uL (ref 0.1–1.0)
Monocytes Relative: 6 %
Neutro Abs: 7.4 10*3/uL (ref 1.7–7.7)
Neutrophils Relative %: 68 %
Platelets: 230 10*3/uL (ref 150–400)
RBC: 4.95 MIL/uL (ref 4.22–5.81)
RDW: 12.9 % (ref 11.5–15.5)
WBC: 10.7 10*3/uL — ABNORMAL HIGH (ref 4.0–10.5)
nRBC: 0 % (ref 0.0–0.2)

## 2021-06-27 NOTE — ED Notes (Signed)
Pt decided to leave while waiting for a room.  

## 2021-06-27 NOTE — ED Triage Notes (Signed)
Patient here from home, states that he has had a migraine for the last two hours.  Patient states that he is out of his pain meds, states that he takes 10/325 Vicodin.  Patient denies any nausea or vomiting at this time.  Patient states that he did have some floaters in his eyes.

## 2021-09-04 ENCOUNTER — Other Ambulatory Visit: Payer: Self-pay

## 2021-09-04 ENCOUNTER — Emergency Department (HOSPITAL_BASED_OUTPATIENT_CLINIC_OR_DEPARTMENT_OTHER)
Admission: EM | Admit: 2021-09-04 | Discharge: 2021-09-04 | Disposition: A | Discharge status: Home / Self Care | Payer: Medicare Other | Attending: Emergency Medicine | Admitting: Emergency Medicine

## 2021-09-04 ENCOUNTER — Encounter (HOSPITAL_BASED_OUTPATIENT_CLINIC_OR_DEPARTMENT_OTHER): Payer: Self-pay | Admitting: Emergency Medicine

## 2021-09-04 DIAGNOSIS — J449 Chronic obstructive pulmonary disease, unspecified: Secondary | ICD-10-CM | POA: Insufficient documentation

## 2021-09-04 DIAGNOSIS — Z20822 Contact with and (suspected) exposure to covid-19: Secondary | ICD-10-CM | POA: Insufficient documentation

## 2021-09-04 DIAGNOSIS — I509 Heart failure, unspecified: Secondary | ICD-10-CM | POA: Insufficient documentation

## 2021-09-04 DIAGNOSIS — F17210 Nicotine dependence, cigarettes, uncomplicated: Secondary | ICD-10-CM | POA: Diagnosis not present

## 2021-09-04 DIAGNOSIS — J45909 Unspecified asthma, uncomplicated: Secondary | ICD-10-CM | POA: Insufficient documentation

## 2021-09-04 LAB — RESP PANEL BY RT-PCR (FLU A&B, COVID) ARPGX2
Influenza A by PCR: NEGATIVE
Influenza B by PCR: NEGATIVE
SARS Coronavirus 2 by RT PCR: NEGATIVE

## 2021-09-04 NOTE — ED Triage Notes (Signed)
Pt arrives to ED with c/o covid. Pt reports that his sister tested positive for COVID yesterday so he tested himself at home which was positive. He currently has no symptoms but states he is here for anti viral medication.

## 2021-09-04 NOTE — Discharge Instructions (Signed)
COVID test was negative.

## 2021-09-04 NOTE — ED Provider Notes (Signed)
[facility] EMERGENCY DEPT Provider Note   CSN: [id number] Arrival date & time: 09/04/21  1359     History Chief Complaint  Patient presents with   COVID Test    [name] is a 50 y.o. male for evaluation of COVID exposure.  States his sister tested positive.  He took a test this morning which showed a faint line.  He is currently asymptomatic.  No fever, chills, nausea vomiting, chest pain, shortness of breath, abdominal pain.  Denies additional aggravating or alleviating factors.  History obtained from patient and past medical records.  No interpreter is used.  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   Acute respiratory failure with hypoxia ([place])    Polysubstance overdose 02/26/2019   Laceration of Achilles tendon, right, initial encounter    Shock ([place]) 08/28/2018   Suicide attempt ([place])    Benzodiazepine (tranquilizer) overdose, intentional self-harm, initial encounter ([place]) 06/13/2018   Overdose of benzodiazepine, intentional self-harm, initial encounter ([place])    Somnolence    Schizophrenia, paranoid type ([place]) 08/19/2017   Hypokalemia 04/28/2014   Drug overdose 04/27/2014   Schizoaffective disorder, bipolar type ([place]) 03/02/2014   Anxiety state 03/02/2014   Benzodiazepine dependence ([place]) 03/02/2014   Chest pain 10/11/2013   EKG abnormalities 10/11/2013   Chronic back pain     Past Surgical History:  Procedure Laterality Date   KNEE SURGERY     four times   WISDOM TOOTH EXTRACTION         Family History  Problem Relation Age of Onset    Hypertension Father     Social History   Tobacco Use   Smoking status: Every Day    Packs/day: 1.00    Years: 33.00    Pack years: 33.00    Types: Cigarettes   Smokeless tobacco: Never  Vaping Use   Vaping Use: Never used  Substance Use Topics   Alcohol use: No    Comment: occ   Drug use: No    Comment: Pt denied    Home Medications Prior to Admission medications   Medication Sig Start Date End Date Taking? Authorizing Provider  acetaminophen (TYLENOL) 325 MG tablet Take 650 mg by mouth every 6 (six) hours as needed for mild pain, fever or headache.    [provider]  albuterol (VENTOLIN HFA) 108 (90 Base) MCG/ACT inhaler Inhale 2 puffs into the lungs every 4 (four) hours as needed for wheezing or shortness of breath. 09/02/19   Varney Biles, MD  ALPRAZolam [name]) 1 MG tablet Take 1 tablet (1 mg total) by mouth daily as needed for anxiety. For anxiety 02/02/20   Lindell Spar I, NP  diphenhydrAMINE (BENADRYL) 25 MG tablet Take 25 mg by mouth every 6 (six) hours as needed for allergies.    [provider]  doxycycline (VIBRAMYCIN) 100 MG capsule Take 1 capsule (100 mg total) by mouth 2 (two) times daily. 05/29/21   Rodriguez-Southworth, Sunday Spillers, PA-C  HYDROcodone-acetaminophen (NORCO) 10-325 MG tablet Take 1 tablet by mouth 3 (three) times daily as  needed for pain. 06/05/20   [provider]  naloxone [facility]) nasal spray 4 mg/0.1 mL Place 1 spray into the nose as needed (overdose).    [provider]    Allergies    Amoxicillin, Dextromethorphan-guaifenesin, Haloperidol, Haloperidol lactate, Meloxicam, Nsaids, Quetiapine, Sudafed [pseudoephedrine hcl], Ziprasidone, and Prednisone  Review of Systems   Review of Systems  Constitutional: Negative.   HENT: Negative.    Respiratory: Negative.    Cardiovascular: Negative.   Gastrointestinal: Negative.   Genitourinary: Negative.   Musculoskeletal: Negative.   Skin: Negative.   Neurological:  Negative.   All other systems reviewed and are negative.  Physical Exam Updated Vital Signs BP 129/86    Pulse 62    Temp 98.2 F (36.8 C)    Resp 16    Ht 6\' 1"  (1.854 m)    Wt 79.4 kg    SpO2 98%    BMI 23.09 kg/m   Physical Exam Vitals and nursing note reviewed.  Constitutional:      General: He is not in acute distress.    Appearance: He is well-developed. He is not ill-appearing, toxic-appearing or diaphoretic.  HENT:     Head: Normocephalic and atraumatic.     Nose: Nose normal.     Mouth/Throat:     Mouth: Mucous membranes are moist.  Eyes:     Pupils: Pupils are equal, round, and reactive to light.  Cardiovascular:     Rate and Rhythm: Normal rate and regular rhythm.     Pulses: Normal pulses.     Heart sounds: Normal heart sounds.  Pulmonary:     Effort: Pulmonary effort is normal. No respiratory distress.     Breath sounds: Normal breath sounds.  Abdominal:     General: Bowel sounds are normal. There is no distension.     Palpations: Abdomen is soft.  Musculoskeletal:        General: No swelling, tenderness, deformity or signs of injury. Normal range of motion.     Cervical back: Normal range of motion and neck supple.  Skin:    General: Skin is warm and dry.     Capillary Refill: Capillary refill takes less than 2 seconds.  Neurological:     General: No focal deficit present.     Mental Status: He is alert and oriented to person, place, and time.    ED Results / Procedures / Treatments   Labs (all labs ordered are listed, but only abnormal results are displayed) Labs Reviewed  RESP PANEL BY RT-PCR (FLU A&B, COVID) [id number]    EKG None  Radiology No results found.  Procedures Procedures   Medications Ordered in ED Medications - No data to display  ED Course  I have reviewed the triage vital signs and the nursing notes.  Pertinent labs & imaging results that were available during my care of the patient were reviewed by me and considered in my  medical decision making (see chart for details).  Here for evaluation of COVID exposure.  Currently asymptomatic.  Appears otherwise well, hemodynamically stable.  COVID test here negative.  Discussed follow-up outpatient PCP.  He is agreeable.  The patient has been appropriately medically screened and/or stabilized in the ED. I have low suspicion for any other emergent medical condition which would require further screening, evaluation or treatment in the ED or require inpatient management.  Patient is hemodynamically stable and in no acute distress.  Patient able to ambulate in department prior to ED.  Evaluation  does not show acute pathology that would require ongoing or additional emergent interventions while in the emergency department or further inpatient treatment.  I have discussed the diagnosis with the patient and answered all questions.  Pain is been managed while in the emergency department and patient has no further complaints prior to discharge.  Patient is comfortable with plan discussed in room and is stable for discharge at this time.  I have discussed strict return precautions for returning to the emergency department.  Patient was encouraged to follow-up with PCP/specialist refer to at discharge.     MDM Rules/Calculators/A&P                             Final Clinical Impression(s) / ED Diagnoses Final diagnoses:  Close exposure to COVID-19 virus    Rx / DC Orders ED Discharge Orders     None        Lekeisha Arenas A, PA-C 09/04/21 Luna Glasgow, MD 09/05/21 [phone]

## 2021-09-14 ENCOUNTER — Other Ambulatory Visit: Payer: Self-pay

## 2021-09-14 ENCOUNTER — Ambulatory Visit (HOSPITAL_COMMUNITY)
Admission: EM | Admit: 2021-09-14 | Discharge: 2021-09-18 | Disposition: A | Discharge status: Other Acute Inpatient Hospital | Payer: Medicare Other | Attending: Urology | Admitting: Urology

## 2021-09-14 DIAGNOSIS — Z9151 Personal history of suicidal behavior: Secondary | ICD-10-CM | POA: Insufficient documentation

## 2021-09-14 DIAGNOSIS — Z91128 Patient's intentional underdosing of medication regimen for other reason: Secondary | ICD-10-CM | POA: Insufficient documentation

## 2021-09-14 DIAGNOSIS — F250 Schizoaffective disorder, bipolar type: Secondary | ICD-10-CM | POA: Insufficient documentation

## 2021-09-14 DIAGNOSIS — Z20822 Contact with and (suspected) exposure to covid-19: Secondary | ICD-10-CM | POA: Insufficient documentation

## 2021-09-14 DIAGNOSIS — Z79899 Other long term (current) drug therapy: Secondary | ICD-10-CM | POA: Insufficient documentation

## 2021-09-14 DIAGNOSIS — T50906A Underdosing of unspecified drugs, medicaments and biological substances, initial encounter: Secondary | ICD-10-CM | POA: Diagnosis not present

## 2021-09-14 DIAGNOSIS — F200 Paranoid schizophrenia: Secondary | ICD-10-CM

## 2021-09-14 LAB — TSH: TSH: 1.648 u[IU]/mL (ref 0.350–4.500)

## 2021-09-14 LAB — LIPID PANEL
Cholesterol: 185 mg/dL (ref 0–200)
HDL: 44 mg/dL (ref >40)
LDL Cholesterol: 114 mg/dL — ABNORMAL HIGH (ref 0–99)
Total CHOL/HDL Ratio: 4.2 RATIO
Triglycerides: 137 mg/dL (ref <150)
VLDL: 27 mg/dL (ref 0–40)

## 2021-09-14 LAB — COMPREHENSIVE METABOLIC PANEL
ALT: 23 U/L (ref 0–44)
AST: 20 U/L (ref 15–41)
Albumin: 4.2 g/dL (ref 3.5–5.0)
Alkaline Phosphatase: 63 U/L (ref 38–126)
Anion gap: 5 (ref 5–15)
BUN: <5 mg/dL — ABNORMAL LOW (ref 6–20)
CO2: 24 mmol/L (ref 22–32)
Calcium: 9.4 mg/dL (ref 8.9–10.3)
Chloride: 105 mmol/L (ref 98–111)
Creatinine, Ser: 0.77 mg/dL (ref 0.61–1.24)
GFR, Estimated: >60 mL/min (ref >60)
Glucose, Bld: 92 mg/dL (ref 70–99)
Potassium: 3.5 mmol/L (ref 3.5–5.1)
Sodium: 134 mmol/L — ABNORMAL LOW (ref 135–145)
Total Bilirubin: 0.7 mg/dL (ref 0.3–1.2)
Total Protein: 7.2 g/dL (ref 6.5–8.1)

## 2021-09-14 LAB — CBC WITH DIFFERENTIAL/PLATELET
Abs Immature Granulocytes: 0.04 10*3/uL (ref 0.00–0.07)
Basophils Absolute: 0.1 10*3/uL (ref 0.0–0.1)
Basophils Relative: 1 %
Eosinophils Absolute: 0.1 10*3/uL (ref 0.0–0.5)
Eosinophils Relative: 1 %
HCT: 48.3 % (ref 39.0–52.0)
Hemoglobin: 16.3 g/dL (ref 13.0–17.0)
Immature Granulocytes: 0 %
Lymphocytes Relative: 24 %
Lymphs Abs: 2.5 10*3/uL (ref 0.7–4.0)
MCH: 29.4 pg (ref 26.0–34.0)
MCHC: 33.7 g/dL (ref 30.0–36.0)
MCV: 87.2 fL (ref 80.0–100.0)
Monocytes Absolute: 0.7 10*3/uL (ref 0.1–1.0)
Monocytes Relative: 7 %
Neutro Abs: 6.8 10*3/uL (ref 1.7–7.7)
Neutrophils Relative %: 67 %
Platelets: 230 10*3/uL (ref 150–400)
RBC: 5.54 MIL/uL (ref 4.22–5.81)
RDW: 12 % (ref 11.5–15.5)
WBC: 10.2 10*3/uL (ref 4.0–10.5)
nRBC: 0 % (ref 0.0–0.2)

## 2021-09-14 LAB — POCT URINE DRUG SCREEN - MANUAL ENTRY (I-SCREEN)
POC Amphetamine UR: NOT DETECTED
POC Buprenorphine (BUP): NOT DETECTED
POC Cocaine UR: NOT DETECTED
POC Marijuana UR: NOT DETECTED
POC Methadone UR: NOT DETECTED
POC Methamphetamine UR: NOT DETECTED
POC Morphine: NOT DETECTED
POC Oxazepam (BZO): NOT DETECTED
POC Oxycodone UR: NOT DETECTED
POC Secobarbital (BAR): NOT DETECTED

## 2021-09-14 LAB — HEMOGLOBIN A1C
Hgb A1c MFr Bld: 5.5 % (ref 4.8–5.6)
Mean Plasma Glucose: 111.15 mg/dL

## 2021-09-14 LAB — RESP PANEL BY RT-PCR (FLU A&B, COVID) ARPGX2
Influenza A by PCR: NEGATIVE
Influenza B by PCR: NEGATIVE
SARS Coronavirus 2 by RT PCR: NEGATIVE

## 2021-09-14 LAB — POC SARS CORONAVIRUS 2 AG -  ED: SARS Coronavirus 2 Ag: NEGATIVE

## 2021-09-14 MED ORDER — OLANZAPINE 10 MG IM SOLR: 10.0000 mg | Freq: Once | INTRAMUSCULAR | Status: AC | PRN

## 2021-09-14 MED ORDER — OLANZAPINE 10 MG PO TBDP
10.0000 mg | ORAL_TABLET | Freq: Once | ORAL | Status: DC
  Filled 2021-09-14: qty 1

## 2021-09-14 MED ORDER — TRAZODONE HCL 50 MG PO TABS
50.0000 mg | ORAL_TABLET | Freq: Every evening | ORAL | Status: DC | PRN
  Filled 2021-09-14: qty 1

## 2021-09-14 MED ORDER — MAGNESIUM HYDROXIDE 400 MG/5ML PO SUSP: 30.0000 mL | Freq: Every day | ORAL | Status: DC | PRN

## 2021-09-14 MED ORDER — OLANZAPINE 5 MG PO TBDP
5.0000 mg | ORAL_TABLET | Freq: Every day | ORAL | Status: DC
Start: 2021-09-14 — End: 2021-09-15

## 2021-09-14 MED ORDER — ALUM & MAG HYDROXIDE-SIMETH 200-200-20 MG/5ML PO SUSP: 30.0000 mL | ORAL | Status: DC | PRN

## 2021-09-14 MED ORDER — DIPHENHYDRAMINE HCL 50 MG/ML IJ SOLN
INTRAMUSCULAR | Status: AC
  Administered 2021-09-14: 50 mg via INTRAMUSCULAR
  Filled 2021-09-14: qty 1

## 2021-09-14 MED ORDER — DIPHENHYDRAMINE HCL 50 MG/ML IJ SOLN: 50.0000 mg | Freq: Once | INTRAMUSCULAR | Status: AC | PRN

## 2021-09-14 MED ORDER — OLANZAPINE 10 MG IM SOLR
INTRAMUSCULAR | Status: AC
  Administered 2021-09-14: 10 mg via INTRAMUSCULAR
  Filled 2021-09-14: qty 10

## 2021-09-14 MED ORDER — HYDROXYZINE HCL 25 MG PO TABS
25.0000 mg | ORAL_TABLET | Freq: Three times a day (TID) | ORAL | Status: DC | PRN
  Filled 2021-09-14: qty 1

## 2021-09-14 MED ORDER — ACETAMINOPHEN 325 MG PO TABS
650.0000 mg | ORAL_TABLET | Freq: Four times a day (QID) | ORAL | Status: DC | PRN
  Administered 2021-09-15 – 2021-09-18 (×3): 650 mg via ORAL
  Filled 2021-09-14 (×4): qty 2

## 2021-09-14 NOTE — Progress Notes (Addendum)
Patient is recommended for inpatient psych treatment. Patient is under IVC. Patient has been faxed out by psychiatry team for inpatient psychiatric treatment.   Patient presents with disorganized thought process and delusional thought content. His speech is garbled. He states that his mom has OCD and that's why he is here. He endorses hearing voices "sometimes." He is unable to described the auditory hallucinations. He denies VH. He has been observed on the unit verbally aggressive and MHT reported patient is threatening staff. He was administered Zyprexa zydis 10 mg po for agitation.   Per nursing: Pt stating to the other Pt's, "Sieg [place]il and hail to [place]" as he is doing the Nazi symbol. Staff redirected, unsuccessful. Locked door to flex unit so Pt can't go back to the adult observation unit. Safety maintained and will continue to monitor.   Unable to verify home psychotropics.  Will initiate Zyprexa zydis 5 mg po QHS for psychosis

## 2021-09-14 NOTE — ED Notes (Signed)
Pt is sleeping on the bed at present. Respirations are even and unlabored. No acute distress noted. Will continue to monitor for safety.
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
## 2021-09-14 NOTE — ED Notes (Signed)
Pt up and ambulating in the observation unit. At times sitting on other beds. Staff explained that he needs to only sit on his bed. Pt converses with staff but unable to understand what Pt is saying. Safety maintained and will continue to monitor.

## 2021-09-14 NOTE — Progress Notes (Signed)
Pt refused Zyprexa. Pt said that he does not need it. Sharl Ma, NP notified via secure chat.

## 2021-09-14 NOTE — ED Notes (Signed)
Admin IM Zyprexa and IM Benadryl for increased agitation, yelling at staff, stating that he should not be here and continued to reference Hitler. Security called prior to IM admin. Safety maintained and will continue to monitor.

## 2021-09-14 NOTE — ED Notes (Signed)
Pt resting on the pull out bed. No s&s of distress. Safety maintained and will continue to monitor.

## 2021-09-14 NOTE — Progress Notes (Signed)
Per Clovis Riley, patient meets criteria for inpatient treatment. There are no available or appropriate beds at [facility] today. CSW faxed referrals to the following facilities for review:  [facility]  Pending - No Request Sent N/A [address], [place] [address] [phone] --  [facility]  Pending - No Request Sent N/A [address], [place] [address] [phone] --  [facility]  Pending - No Request [address] [place] [address] [phone] --  [address] [place] [address] [phone] --  [facility]  Pending - No Request Sent N/A [address] [place] [address] [phone] --  [facility]  Pending - No Request Sent N/A [address] [place], [place] [address] [phone] --  [facility]  Pending - No Request Sent N/A [address] [place] [address] [place] --  [facility]  Pending - No Request Sent N/A [address], [place] [address] [facility]  Pending - No Request Sent N/A [address] [place] [address] [phone] --  [facility]  Pending - No Request Sent N/A [address], [place] [address] [phone] --  [facility]  Pending - No Request Sent N/A [address] [place] [address] [phone] --  [facility]  Pending - No Request Sent N/A [address] [place] [address] [phone] --  [facility]  Pending - No Request Sent N/A [address] [place] [address] [phone] --   [facility]  Pending - No Request Sent N/A [address] [place] [address] [phone] --   TTS will continue to seek bed placement.  [place]ie [place], MSW, [place], LCAS-A Phone: [phone] Disposition/TOC

## 2021-09-14 NOTE — ED Notes (Signed)
Pt stating to the other Pt's, "[place]ieg [place]il and hail to Hitler" as he is doing the Nazi symbol. Staff redirected, unsuccessful. Locked door to flex unit so Pt can't go back to the adult observation unit. Safety maintained and will continue to monitor.

## 2021-09-14 NOTE — ED Notes (Signed)
Pt is sleeping in the bed at present. Respirations are even and unlabored. No distress noted. Will continue to monitor for safety.
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
## 2021-09-14 NOTE — ED Provider Notes (Signed)
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 [place], [place] [address]   SARS Coronavirus 2 by RT PCR 04/27/2021 NEGATIVE  NEGATIVE Final   Comment: (NOTE) SARS-CoV-2 target nucleic acids are  NOT DETECTED.  The SARS-CoV-2 RNA is generally detectable in upper respiratory specimens during the acute phase of infection. The  lowest concentration of SARS-CoV-2 viral copies this assay can detect is 138 copies/mL. A negative result does not preclude SARS-Cov-2 infection and should not be used as the sole basis for treatment or other patient management decisions. A negative result may occur with  improper specimen collection/handling, submission of specimen other than nasopharyngeal swab, presence of viral mutation(s) within the areas targeted by this assay, and inadequate number of viral copies(<138 copies/mL). A negative result must be combined with clinical observations, patient history, and epidemiological information. The expected result is Negative.  Fact Sheet for Patients:  EntrepreneurPulse.com.au  Fact Sheet for Healthcare Providers:  IncredibleEmployment.be  This test is no                          t yet approved or cleared by the [place] FDA and  has been authorized for detection and/or diagnosis of SARS-CoV-2 by FDA under an Emergency Use Authorization (EUA). This EUA will remain  in effect (meaning this test can be used) for the duration of the COVID-19 declaration under Section 564(b)(1) of the Act, 21 U.S.C.section 360bbb-3(b)(1), unless the authorization is terminated  or revoked sooner.       Influenza A by PCR 04/27/2021 NEGATIVE  NEGATIVE Final   Influenza B by PCR 04/27/2021 NEGATIVE  NEGATIVE Final   Comment: (NOTE) The Xpert Xpress SARS-CoV-2/FLU/RSV plus assay is intended as an aid in the diagnosis of influenza from Nasopharyngeal swab specimens and should not be used as a sole basis for treatment. Nasal washings and aspirates are unacceptable for Xpert Xpress SARS-CoV-2/FLU/RSV testing.  Fact Sheet for Patients: EntrepreneurPulse.com.au  Fact Sheet for Healthcare Providers: IncredibleEmployment.be  This test is not yet approved or cleared by the [place] FDA and has been authorized for  detection and/or diagnosis of SARS-CoV-2 by FDA under an Emergency Use Authorization (EUA). This EUA will remain in effect (meaning this test can be used) for the duration of the COVID-19 declaration under Section 564(b)(1) of the Act, 21 U.S.C. section 360bbb-3(b)(1), unless the authorization is terminated or revoked.  Performed at [facility], [place] [address], [place], [place] [address]   Admission on 04/21/2021, Discharged on 04/21/2021  Component Date Value Ref Range Status   Troponin I (High Sensitivity) 04/21/2021 4  <18 ng/L Final   Comment: (NOTE) Elevated high sensitivity troponin I (hsTnI) values and significant  changes across serial measurements may suggest ACS but many other  chronic and acute conditions are known to elevate hsTnI results.  Refer to the "Links" section for chest pain algorithms and additional  guidance. Performed at [facility], [place] [address] [place], [place] [address]    Sodium 04/21/2021 140  135 - 145 mmol/L Final   Potassium 04/21/2021 3.7  3.5 - 5.1 mmol/L Final   Chloride 04/21/2021 107  98 - 111 mmol/L Final   CO2 04/21/2021 26  22 - 32 mmol/L Final   Glucose, Bld 04/21/2021 92  70 - 99 mg/dL Final   Glucose reference range applies only to samples taken after fasting for at least 8 hours.   BUN 04/21/2021 6  6 - 20 mg/dL Final   Creatinine, Ser 04/21/2021 0.81  0.61 - 1.24 mg/dL Final   Calcium 04/21/2021 9.7  8.9 - 10.3 mg/dL Final   GFR, Estimated 04/21/2021 >60  >60 mL/min Final  Comment: (NOTE) Calculated using the CKD-EPI Creatinine Equation (2021)    Anion gap 04/21/2021 7  5 - 15 Final   Performed at [facility], [place] [address] [place], [place] [address]   WBC 04/21/2021 10.3  4.0 - 10.5 K/uL Final   RBC 04/21/2021 5.52  4.22 - 5.81 MIL/uL Final   Hemoglobin 04/21/2021 16.2  13.0 - 17.0 g/dL Final   HCT 04/21/2021 48.2  39.0 - 52.0 % Final   MCV 04/21/2021 87.3  80.0 - 100.0 fL Final    MCH 04/21/2021 29.3  26.0 - 34.0 pg Final   MCHC 04/21/2021 33.6  30.0 - 36.0 g/dL Final   RDW 04/21/2021 11.7  11.5 - 15.5 % Final   Platelets 04/21/2021 218  150 - 400 K/uL Final   nRBC 04/21/2021 0.0  0.0 - 0.2 % Final   Performed at [facility], [place] [address], [place], [place] [address]   B Natriuretic Peptide 04/21/2021 13.0  0.0 - 100.0 pg/mL Final   Performed at [place] [address] [place], [place] [address]   D-Dimer, America Brown 04/21/2021 <0.27  0.00 - 0.50 ug/mL-FEU Final   Comment: (NOTE) At the manufacturer cut-off value of 0.5 g/mL FEU, this assay has a negative predictive value of 95-100%.This assay is intended for use in conjunction with a clinical pretest probability (PTP) assessment model to exclude pulmonary embolism (PE) and deep venous thrombosis (DVT) in outpatients suspected of PE or DVT. Results should be correlated with clinical presentation. Performed at [facility], [place] [address], [place], [place] [address]     Allergies: Amoxicillin, Dextromethorphan-guaifenesin, Haloperidol, Haloperidol lactate, Meloxicam, Nsaids, Quetiapine, Sudafed [pseudoephedrine hcl], Ziprasidone, and Prednisone  PTA Medications: (Not in a hospital admission)   Medical Decision Making  Patient presented under IVC due to worsening hallucinations, threat to others, and medication non compliance. Patient recommended for  inpatient psychiatric treatment. There are no available bed at this time at [facility]; patient will be admitted to [facility] for continuous assessment and stabilization while awaiting inpatient psych bed.  -    Recommendations  Based on my evaluation the patient does not appear to have an emergency medical condition.  Ophelia Shoulder, NP 09/15/21  1:03 AM

## 2021-09-15 DIAGNOSIS — F200 Paranoid schizophrenia: Secondary | ICD-10-CM | POA: Diagnosis not present

## 2021-09-15 DIAGNOSIS — F250 Schizoaffective disorder, bipolar type: Secondary | ICD-10-CM | POA: Diagnosis not present

## 2021-09-15 LAB — PROLACTIN: Prolactin: 20.8 ng/mL — ABNORMAL HIGH (ref 4.0–15.2)

## 2021-09-15 MED ORDER — OLANZAPINE 10 MG IM SOLR: 10.0000 mg | Freq: Once | INTRAMUSCULAR | Status: AC

## 2021-09-15 MED ORDER — DIPHENHYDRAMINE HCL 50 MG/ML IJ SOLN
50.0000 mg | Freq: Every day | INTRAMUSCULAR | Status: DC | PRN
  Administered 2021-09-15: 50 mg via INTRAMUSCULAR
  Filled 2021-09-15: qty 1

## 2021-09-15 MED ORDER — OLANZAPINE 10 MG IM SOLR
10.0000 mg | Freq: Every day | INTRAMUSCULAR | Status: AC | PRN
  Administered 2021-09-15: 10 mg via INTRAMUSCULAR
  Filled 2021-09-15: qty 10

## 2021-09-15 MED ORDER — OLANZAPINE 10 MG PO TBDP
10.0000 mg | ORAL_TABLET | Freq: Every day | ORAL | Status: DC
  Administered 2021-09-16 – 2021-09-17 (×2): 10 mg via ORAL
  Filled 2021-09-15 (×5): qty 1

## 2021-09-15 MED ORDER — LORAZEPAM 2 MG/ML IJ SOLN
1.0000 mg | Freq: Four times a day (QID) | INTRAMUSCULAR | Status: DC | PRN
  Administered 2021-09-15 – 2021-09-16 (×2): 1 mg via INTRAMUSCULAR
  Filled 2021-09-15 (×3): qty 1

## 2021-09-15 MED ORDER — OLANZAPINE 5 MG PO TBDP
5.0000 mg | ORAL_TABLET | Freq: Once | ORAL | Status: AC
Start: 2021-09-15 — End: 2021-09-15
  Administered 2021-09-15: 5 mg via ORAL
  Filled 2021-09-15: qty 1

## 2021-09-15 MED ORDER — OLANZAPINE 10 MG IM SOLR
INTRAMUSCULAR | Status: AC
  Administered 2021-09-15: 10 mg via INTRAMUSCULAR
  Filled 2021-09-15: qty 10

## 2021-09-15 MED ORDER — LORAZEPAM 1 MG PO TABS: 1.0000 mg | ORAL_TABLET | Freq: Four times a day (QID) | ORAL | Status: DC | PRN

## 2021-09-15 NOTE — ED Notes (Signed)
Pt refused EKG. Pt stated " I need a medical doctor for that, you can't do anything for me because you are not a medical doctor".RN notified. Will continue to monitor for safety.

## 2021-09-15 NOTE — Progress Notes (Signed)
[name] was lying on his chair bed singing and talking. Then he got up and was offered food for the second time, he refused and stated he needs to go to the hospital.

## 2021-09-15 NOTE — Progress Notes (Signed)
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

## 2021-09-15 NOTE — ED Notes (Signed)
Pt agitated,yelling and requesting to leave. Pt informed that he cannot leave until he is discharged from the facility.

## 2021-09-15 NOTE — ED Notes (Signed)
Patient took po medication with some hesitance - will monitor for safety and effectiveness

## 2021-09-15 NOTE — Progress Notes (Signed)
Mali continued to use fowl, curding language including the word nigger. He was back and forth on the phone, pacing, demanding hot food and refusing the choices that was offered. His language,tone increased and profanity. This Probation officer called and asked my colleague who is caucasian to administered the ordered medication with security assistance. He continued with the above language after the administration of medication.

## 2021-09-15 NOTE — Progress Notes (Signed)
Mali is up talking on the phone, screaming and disorganized talking with cursing words.

## 2021-09-15 NOTE — ED Notes (Addendum)
Pt laying in bed, occasionally yelling out, cussing, and hitting the wall. 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

## 2021-09-15 NOTE — ED Notes (Addendum)
Nursing staff and security present on unit for medication administration. Pt in agreement to take PRN IM medications. Medications were administered without difficulty. Pt denied offer for food/drink. Pt continues to lay in bed. No signs of acute distress noted. Will continue to monitor for safety.

## 2021-09-15 NOTE — ED Notes (Signed)
Pt requested for pain medication for back pain.

## 2021-09-15 NOTE — ED Notes (Signed)
Pt is awake resting in the bed. Respirations are even and unlabored. No acute distress noted. Will continue to monitor for safety

## 2021-09-15 NOTE — ED Notes (Signed)
[place] sandwich and warm water provided per pt request.

## 2021-09-15 NOTE — Progress Notes (Signed)
Received [name] this AM in the flex area,talking out loud to himself with a disorganized conversation. He is pacing  and on the phone at intervals. He laid down on his chair bed at 0837 hrs.

## 2021-09-15 NOTE — ED Provider Notes (Addendum)
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 for the duration of the COVID-19 declaration under Section 564(b)(1) of the Act, 21 U.S.C. section 360bbb-3(b)(1), unless the authorization is terminated or revoked.  Performed at [facility], [place] [address] [place], [place] [address]   Admission on 04/21/2021, Discharged on 04/21/2021  Component Date Value Ref Range Status   Troponin I (High Sensitivity) 04/21/2021 4  <18 ng/L Final    Comment: (NOTE) Elevated high sensitivity troponin I (hsTnI) values and significant  changes across serial measurements  may suggest ACS but many other  chronic and acute conditions are known to elevate hsTnI results.  Refer to the "Links" section for chest pain algorithms and additional  guidance. Performed at [facility], [place] [address], [place], [place] [address]    Sodium 04/21/2021 140  135 - 145 mmol/L Final   Potassium 04/21/2021 3.7  3.5 - 5.1 mmol/L Final   Chloride 04/21/2021 107  98 - 111 mmol/L Final   CO2 04/21/2021 26  22 - 32 mmol/L Final   Glucose, Bld 04/21/2021 92  70 - 99 mg/dL Final   Glucose reference range applies only to samples taken after fasting for at least 8 hours.   BUN 04/21/2021 6  6 - 20 mg/dL Final   Creatinine, Ser 04/21/2021 0.81  0.61 - 1.24 mg/dL Final   Calcium 04/21/2021 9.7  8.9 - 10.3 mg/dL Final   GFR, Estimated 04/21/2021 >60  >60 mL/min Final   Comment: (NOTE) Calculated using the CKD-EPI Creatinine Equation (2021)    Anion gap 04/21/2021 7  5 - 15 Final   Performed at [facility], [place] [address] [place], [place] [address]   WBC 04/21/2021 10.3  4.0 - 10.5 K/uL Final   RBC 04/21/2021 5.52  4.22 - 5.81 MIL/uL Final   Hemoglobin 04/21/2021 16.2  13.0 - 17.0 g/dL Final   HCT 04/21/2021 48.2  39.0 - 52.0 % Final   MCV 04/21/2021 87.3  80.0 - 100.0 fL Final   MCH 04/21/2021 29.3  26.0 - 34.0 pg Final   MCHC 04/21/2021 33.6  30.0 - 36.0 g/dL Final   RDW 04/21/2021 11.7  11.5 - 15.5 % Final   Platelets 04/21/2021 218  150 - 400 K/uL Final   nRBC 04/21/2021 0.0  0.0 - 0.2 % Final   Performed at [facility], [place] [address], [place], [place] [address]   B Natriuretic Peptide 04/21/2021 13.0  0.0 - 100.0 pg/mL Final   Performed at [place] [address] [place], [place] [address]   D-Dimer, America Brown 04/21/2021 <0.27  0.00 - 0.50 ug/mL-FEU Final   Comment:  (NOTE) At the manufacturer cut-off value of 0.5 g/mL FEU, this assay has a negative predictive value of 95-100%.This assay is intended for use in conjunction with a clinical pretest probability (PTP) assessment model to exclude pulmonary embolism (PE) and deep venous thrombosis (DVT) in outpatients suspected of PE or DVT. Results should be correlated with clinical presentation. Performed at [facility], [place] [address], [place], [place] [address]     Blood Alcohol level:  Lab Results  Component Value Date   [facility] <10 04/27/2021   ETH <10 45/80/9983    Metabolic Disorder Labs: Lab Results  Component Value Date   HGBA1C 5.5 09/14/2021   MPG 111.15 09/14/2021   MPG 114.02 01/17/2020   Lab Results  Component Value Date   PROLACTIN 16.1 (H) 01/17/2020   PROLACTIN 42.9 (H) 08/19/2017   Lab Results  Component Value Date   CHOL 185 09/14/2021   TRIG 137 09/14/2021   HDL 44 09/14/2021   CHOLHDL 4.2 09/14/2021   VLDL 27 09/14/2021   LDLCALC 114 (H) 09/14/2021   LDLCALC 84 01/17/2020    Therapeutic Lab Levels: No results found for: LITHIUM Lab Results  Component Value Date   VALPROATE <10 (L) 12/07/2020   VALPROATE 43 (L) 01/31/2020   No components found for:  CBMZ  Physical Findings   AIMS  [place] Admission (Discharged) from 08/19/2017 in [facility] [facility] Admission (Discharged) from 06/16/2017 in [facility] [facility]  AIMS Total Score 0 0      AUDIT    Flowsheet Row Admission (Discharged) from 01/16/2020 in [place] [facility] Admission (Discharged) from 08/19/2017 in [facility] [facility] Admission (Discharged) from 06/16/2017 in [place] [facility] ED to Hosp-Admission (Discharged) from 02/26/2014 in [facility] [facility]  Alcohol Use Disorder Identification Test Final Score (AUDIT) 0 0 0  10      Flowsheet Row ED from 09/04/2021 in [facility] ED from 06/27/2021 in [facility] ED from 05/29/2021 in [facility] No Risk No Risk No Risk        Musculoskeletal  Strength & Muscle Tone: within normal limits Gait & Station: normal Patient leans: N/A  Psychiatric Specialty Exam  Presentation  General Appearance: Disheveled  Eye Contact:None  Speech:Garbled  Speech Volume:Normal  Handedness:Right   Mood and Affect  Mood:Irritable  Affect:Congruent   Thought Process  Thought Processes:Disorganized  Descriptions of Associations:Loose  Orientation:Partial  Thought Content:Illogical  Diagnosis of Schizophrenia or Schizoaffective disorder in past: Yes  Duration of Psychotic Symptoms: Greater than six months   Hallucinations:Hallucinations: None  Ideas of Reference:Delusions  Suicidal Thoughts:Suicidal Thoughts: No  Homicidal Thoughts:Homicidal Thoughts: No   Sensorium  Memory:Immediate Poor; Recent Poor; Remote Poor  Judgment:Impaired  Insight:Poor   Executive Functions  Concentration:Poor  Attention Span:Fair  Recall:Poor  Fund of Knowledge:Poor  Language:Poor   Psychomotor Activity  Psychomotor Activity:Psychomotor Activity: Restlessness   Assets  Assets:Leisure Time; Physical Health   Sleep  Sleep:Sleep: Fair Number of Hours of Sleep: 5   Nutritional Assessment (For OBS and FBC admissions only) Has the patient had a weight loss or gain of 10 pounds or more in the last 3 months?: No Has the patient had a decrease in food intake/or appetite?: No Does the patient have dental problems?: No Does the patient have eating habits or behaviors that may be indicators of an eating disorder including binging or inducing vomiting?: No Has the patient recently lost weight without trying?: 0 Has the patient been eating  poorly because of a decreased appetite?: 0 Malnutrition Screening Tool Score: 0    Physical Exam  Physical Exam Constitutional:      Appearance: Normal appearance.  HENT:     Head: Normocephalic.     Nose: Nose normal.  Eyes:     Conjunctiva/sclera: Conjunctivae normal.  Cardiovascular:     Rate and Rhythm: Normal rate.  Pulmonary:     Effort: Pulmonary effort is normal.  Neurological:     Mental Status: He is alert and oriented to person, place, and time.   Review of Systems  Unable to perform ROS: Psychiatric disorder  Patient became threatening. UTA Blood pressure 103/62, pulse 94, temperature 97.6 F (36.4 C), temperature source Oral, resp. rate 18, SpO2 98 %. There is no height or weight on file to calculate BMI.  Treatment Plan Summary: Patient is under IVC. Patient recommended for inpatient psych treatment. Patient faxed out. BAL and UA ordered per CSW request for referral.  . Medications: Zyprexa zydis 5 mg po increased to 10 mg po QHS for schizophrenia  Added ativan 1 mg po/IM every 6 hours as needed for agitation  Labs: Ordered repeat EKG. Ordered a UA. BAL  Marissa Calamity, NP 09/15/2021 9:55 AM

## 2021-09-15 NOTE — ED Notes (Signed)
Pt eating dinner

## 2021-09-15 NOTE — ED Notes (Addendum)
Pt is agitated, pacing, cursing and yelling. Staff redirected pt but unsuccessful. Pt refusing medication. Provider notified. Will continue to monitor for safety.

## 2021-09-15 NOTE — ED Notes (Signed)
Pt continues to yell out and cuss while laying in bed, becoming increasingly louder and more agitated. Pt continues to refuse PO medication. Ene Ajibola,NP notified who advised to administer IM Ativan and IM Zyprexa per MAR.

## 2021-09-15 NOTE — ED Notes (Addendum)
Pt just got off phone, now walking around unit mumbling to himself stating he "needs a medical evaluation not a psychiatric evaluation." Pt denies current SI/HI/AVH. Pt went to lay down in bed and continues to mumble to self. No signs of acute distress noted. Will continue to monitor for safety.

## 2021-09-15 NOTE — Progress Notes (Signed)
[name] is in his chair bed resting quietly at the present time.

## 2021-09-15 NOTE — ED Notes (Signed)
Pt requested milk; pt given milk upon request.

## 2021-09-15 NOTE — ED Notes (Signed)
Pt refusing bedtime medication at this time, stating he "quit taking medicine."

## 2021-09-15 NOTE — Progress Notes (Signed)
This Probation officer talked with [name]'s mom in the lobby. She was concerned related to him calling and verbalizing he was not being feed. She wanted  to know his status for long term care and talk with the doctor. She was updated on his nutrition and informed the doctor will be available tomorrow. She was made aware the NP's were taking care of him at the present time. He ate a TV dinner at 1757 hrs then returned to his chair bed.

## 2021-09-15 NOTE — ED Notes (Signed)
Pt eating breakfast 

## 2021-09-15 NOTE — ED Notes (Signed)
Pt on the phone in flex area.

## 2021-09-16 ENCOUNTER — Encounter (HOSPITAL_COMMUNITY): Payer: Self-pay

## 2021-09-16 DIAGNOSIS — F250 Schizoaffective disorder, bipolar type: Secondary | ICD-10-CM | POA: Diagnosis not present

## 2021-09-16 NOTE — ED Notes (Signed)
Pt resting quietly in bed. Respirations even and unlabored. No signs of acute distress noted. Will continue to monitor for safety.

## 2021-09-16 NOTE — Progress Notes (Signed)
Patient denied SI, HI, AVH.  Ate breakfast.  Cereal X2, milk, and juice X2.  Continue to monitor for safety.

## 2021-09-16 NOTE — Progress Notes (Signed)
Patient resting on bed. Quiet.  Continue to monitor for safety.

## 2021-09-16 NOTE — Progress Notes (Signed)
Patient appears to be sleeping.  Respirations even and unlabored.  Continue to monitor for safety.

## 2021-09-16 NOTE — Progress Notes (Signed)
Patient ate lunch.  Resting on bed, quiet.
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
## 2021-09-16 NOTE — ED Notes (Signed)
Pt asleep in bed. Respirations even and unlabored. Will continue to monitor for safety. ?

## 2021-09-16 NOTE — Progress Notes (Signed)
Patient yelling, but difficult to understand. Threw ice across floor.  Approached patient and discussed IM Ativan.  Patient agreed to injection at this time.  Patient given Ativan 1 mg IM with additional RN and MHT present.  No complaints with injection.

## 2021-09-16 NOTE — Progress Notes (Signed)
Patient complaining of back pain.  Given Tylenol per eMAR.  Provider notified via secure chat.

## 2021-09-16 NOTE — Progress Notes (Signed)
Patient resting on bed, quiet.
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
## 2021-09-16 NOTE — ED Provider Notes (Signed)
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 duration of the COVID-19 declaration under Section 564(b)(1) of the Act, 21 U.S.C. section 360bbb-3(b)(1), unless the authorization is terminated or revoked.  Performed at [facility], [place] [address], [place],  [address]   Admission on 04/21/2021, Discharged  on 04/21/2021  Component Date Value Ref Range Status   Troponin I (High Sensitivity) 04/21/2021 4  <18 ng/L Final    Comment: (NOTE) Elevated high sensitivity troponin I (hsTnI) values and significant  changes across serial measurements may suggest ACS but many other  chronic and acute conditions are known to elevate hsTnI results.  Refer to the "Links" section for chest pain algorithms and additional  guidance. Performed at [facility], [place] [address], [place], [place] [address]    Sodium 04/21/2021 140  135 - 145 mmol/L Final   Potassium 04/21/2021 3.7  3.5 - 5.1 mmol/L Final   Chloride 04/21/2021 107  98 - 111 mmol/L Final   CO2 04/21/2021 26  22 - 32 mmol/L Final   Glucose, Bld 04/21/2021 92  70 - 99 mg/dL Final   Glucose reference range applies only to samples taken after fasting for at least 8 hours.   BUN 04/21/2021 6  6 - 20 mg/dL Final   Creatinine, Ser 04/21/2021 0.81  0.61 - 1.24 mg/dL Final   Calcium 04/21/2021 9.7  8.9 - 10.3 mg/dL Final   GFR, Estimated 04/21/2021 >60  >60 mL/min Final   Comment: (NOTE) Calculated using the CKD-EPI Creatinine Equation (2021)    Anion gap 04/21/2021 7  5 - 15 Final   Performed at [facility], [place] [address] [place], [place] [address]   WBC 04/21/2021 10.3  4.0 - 10.5 K/uL Final   RBC 04/21/2021 5.52  4.22 - 5.81 MIL/uL Final   Hemoglobin 04/21/2021 16.2  13.0 - 17.0 g/dL Final   HCT 04/21/2021 48.2  39.0 - 52.0 % Final   MCV 04/21/2021 87.3  80.0 - 100.0 fL Final   MCH 04/21/2021 29.3  26.0 - 34.0 pg Final   MCHC 04/21/2021 33.6  30.0 - 36.0 g/dL Final   RDW 04/21/2021 11.7  11.5 - 15.5 % Final   Platelets 04/21/2021 218  150 - 400 K/uL Final   nRBC 04/21/2021 0.0  0.0 - 0.2 % Final   Performed at [facility], [place] [address] [place], [place] [address]   B Natriuretic Peptide 04/21/2021 13.0  0.0 - 100.0 pg/mL Final   Performed at [place] [address] [place], [place] [address]   D-Dimer, America Brown 04/21/2021 <0.27  0.00 - 0.50 ug/mL-FEU Final   Comment:  (NOTE) At the manufacturer cut-off value of 0.5 g/mL FEU, this assay has a negative predictive value of 95-100%.This assay is intended for use in conjunction with a clinical pretest probability (PTP) assessment model to exclude pulmonary embolism (PE) and deep venous thrombosis (DVT) in outpatients suspected of PE or DVT. Results should be correlated with clinical presentation. Performed at [facility], [place] [address], [place], [place] [address]     Blood Alcohol level:  Lab Results  Component Value Date   [facility] <10 04/27/2021   ETH <10 63/87/5643    Metabolic Disorder Labs: Lab Results  Component Value Date   HGBA1C 5.5 09/14/2021   MPG 111.15 09/14/2021   MPG 114.02 01/17/2020   Lab Results  Component Value Date   PROLACTIN 20.8 (H) 09/14/2021   PROLACTIN 16.1 (H) 01/17/2020   Lab Results  Component Value Date   CHOL 185 09/14/2021   TRIG 137 09/14/2021   HDL 44 09/14/2021   CHOLHDL 4.2 09/14/2021   VLDL 27 09/14/2021   LDLCALC 114 (H) 09/14/2021   LDLCALC 84 01/17/2020    Therapeutic Lab  Levels: No results found for: LITHIUM Lab Results  Component Value Date   VALPROATE <10 (L) 12/07/2020   VALPROATE 43 (L) 01/31/2020   No components found for:  CBMZ  Physical Findings   AIMS    Flowsheet Row Admission (Discharged) from 08/19/2017 in [place] [facility] Admission (Discharged) from 06/16/2017 in [place] [facility]  AIMS Total Score 0 0      AUDIT    Flowsheet Row Admission (Discharged) from 01/16/2020 in [place] [facility] Admission (Discharged) from 08/19/2017 in [place] [facility] Admission (Discharged) from 06/16/2017 in [place] [facility] ED to Hosp-Admission (Discharged) from 02/26/2014 in [place] [facility]  Alcohol Use Disorder Identification Test Final Score (AUDIT) 0 0 0  10      Flowsheet Row ED from 09/04/2021 in [facility] ED from 06/27/2021 in [place] ED from 05/29/2021 in [facility] No Risk No Risk No Risk        Musculoskeletal  Strength & Muscle Tone: within normal limits Gait & Station: normal Patient leans: N/A  Psychiatric Specialty Exam  Presentation  General Appearance: Disheveled  Eye Contact:Fleeting  Speech:Clear and Coherent; Normal Rate  Speech Volume:Normal  Handedness:Right   Mood and Affect  Mood:Anxious  Affect:Congruent   Thought Process  Thought Processes:Disorganized  Descriptions of Associations:Tangential  Orientation:Partial  Thought Content:Logical  Diagnosis of Schizophrenia or Schizoaffective disorder in past: Yes  Duration of Psychotic Symptoms: Greater than six months   Hallucinations:Hallucinations: None  Ideas of Reference:Delusions; Paranoia  Suicidal Thoughts:Suicidal Thoughts: No  Homicidal Thoughts:Homicidal Thoughts: No   Sensorium  Memory:Immediate Poor; Recent Poor; Remote Poor  Judgment:Impaired  Insight:Lacking   Executive Functions  Concentration:Fair  Attention Span:Fair  [place]   Psychomotor Activity  Psychomotor Activity:Psychomotor Activity: Restlessness   Assets  Assets:Communication Skills; Physical Health; Resilience   Sleep  Sleep:Sleep: Fair   No data recorded  Physical Exam  Physical Exam Vitals and nursing note reviewed.  Constitutional:      Appearance: Normal appearance. He is well-developed.  HENT:     Head: Normocephalic and atraumatic.  Eyes:     General:        Right eye: No discharge.        Left eye: No discharge.     Conjunctiva/sclera: Conjunctivae normal.  Cardiovascular:     Rate and Rhythm: Normal rate.  Pulmonary:     Effort: Pulmonary effort is normal.  No respiratory distress.  Musculoskeletal:        General: Normal range of motion.     Cervical back: Normal range of motion.  Skin:    Coloration: Skin is not jaundiced or pale.  Neurological:     Mental Status: He is alert and oriented to person, place, and time.  Psychiatric:        Attention and Perception: Attention and perception normal.        Mood and Affect: Mood is anxious.        Speech: Speech is tangential.        Behavior: Behavior is cooperative.        Thought Content: Thought content is paranoid and delusional.        Cognition and Memory: Cognition normal.   Review of Systems  Constitutional: Negative.   HENT: Negative.  Eyes: Negative.   Respiratory: Negative.    Cardiovascular: Negative.   Musculoskeletal: Negative.   Skin: Negative.   Neurological: Negative.   Psychiatric/Behavioral:  The patient is nervous/anxious.   Blood pressure 103/74, pulse 73, temperature (!) 97.5 F (36.4 C), temperature source Oral, resp. rate 16, SpO2 95 %. There is no height or weight on file to calculate BMI.  Treatment Plan Summary: Daily contact with patient to assess and evaluate symptoms and progress in treatment and Medication management  Disposition: Patient continues to meet the criteria for inpatient psychiatric admission.  [facility] notified.  There were no available beds.  Social worker contacted and patient has been faxed out  Revonda Humphrey, NP 09/16/2021 10:32 PM

## 2021-09-16 NOTE — Progress Notes (Signed)
Patient talking on phone.

## 2021-09-16 NOTE — Progress Notes (Signed)
Patient has been denied by [facility] due to no appropriate beds at this time. Patient meets [place] inpatient criteria per Leandro Reasoner, NP. Patient has been faxed out to the following facilities:    [facility]  [address], [place] [address] [phone]  [address] [place] [address] [phone]  [facility]  [address] [place] [address] [place]  [facility]  [address], [place] [address] [phone]  [place]  [place], [place] [address] [phone] [facility]  [address] [place], [place] [address] [phone] [place] [place] [address] [place]  [facility]  [address] [place] [address] [place]  [facility]  [address], [place] [address] [phone]  [facility]  [address], [place] [address] [phone] [facility]  [address], [place] [address] [phone]  [facility]  [address] [place] [address] [phone] [facility]  [address], [place] [address] [phone]  [facility]  [address] [place] [address] [phone]   Mariea Clonts, MSW, LCSW-A  10:57 AM 09/16/2021

## 2021-09-17 DIAGNOSIS — F250 Schizoaffective disorder, bipolar type: Secondary | ICD-10-CM | POA: Diagnosis not present

## 2021-09-17 MED ORDER — OLANZAPINE 5 MG PO TBDP: 5.0000 mg | ORAL_TABLET | Freq: Once | ORAL | Status: DC

## 2021-09-17 NOTE — ED Notes (Signed)
Writer called pt name and explained he has some medication to take he asked what was it and Probation officer told him zyrexa, vistaril, and trazodone he replied no thanks

## 2021-09-17 NOTE — Progress Notes (Signed)
Patient has been faxed out under the recommendation of Dr. Dwyane Dee. Patient meets [place] inpatient criteria per Leandro Reasoner, NP. Patient has been faxed out to multiple facilities.    Mariea Clonts, MSW, LCSW-A  12:02 PM 09/17/2021

## 2021-09-17 NOTE — ED Notes (Signed)
Mali continues to have rambling conversation with self and others but display less verbal aggression then yesterday.

## 2021-09-17 NOTE — Progress Notes (Signed)
Pt was accepted to [facility] PENDING [facility] facility needs due to room being pt repaired and painted  Pt meets inpatient criteria per Thomes Lolling  Attending Physician will be Magdalen Spatz, MD  Report can be called to: Adult unit: [phone]  Care Team notified via secure chat: Night [facility], Margorie John, PA-C, Lindon Romp, NP, [facility], [place], [place], [place], [place], Leonides Sake, MD, Sharen Counter, RN.  [name], LCSWA 09/17/2021 @ 10:45 PM

## 2021-09-17 NOTE — ED Provider Notes (Signed)
Behavioral Health Progress Note  Date and Time: 09/17/2021 3:52 PM Name: [name] MRN:  [id number]  Subjective: [name], 51 y.o., male patient seen face to face by this provider, chart reviewed and discussed with Dr. Serafina Mitchell on 09/17/21.  Patient initially presented to [facility] on 09/14/2021 by IVC.  He was recommended for inpatient psychiatric admission. He has remained on the unit while awaiting bed availability.  He has a history of schizophrenia, depression, suicide attempt, and anxiety.    On today's reassessment. Patient is laying in the bed talking to himself. He appears to be responding to internal/external stimuli. He is anxious. He is disorganized in his thought process.His speech is clear, coherent, tangential and pressured at times. He is hyper verbal. He is alert to person and city. He is disheveled and has toothpaste on his face and scrub top. States he washed his face with the toothpaste. When this writer approached him he immediately got out of the bed and started pacing the room. He is paranoid and continues to state he has been poisoned. He continues to deny SI/HI/AVH.  Per nursing notes patient continues to exhibit odd behaviors.  Zyprexa 5 mg qam was added and patient refused    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[place])    Total Time spent with patient: 20 minutes  Past Psychiatric History: see h&p Past Medical History:  Past Medical History:  Diagnosis Date   Asthma    CHF (congestive heart failure) (HCC)    Chronic back pain    COPD (chronic obstructive pulmonary disease) (HCC)    Knee pain, chronic    Migraines    Schizophrenia, schizo-affective ([place])     Past Surgical History:  Procedure Laterality Date   KNEE SURGERY     four times   WISDOM TOOTH EXTRACTION     Family History:  Family History  Problem Relation Age of Onset   Hypertension Father    Family Psychiatric  History: see H&P Social History:  Social History    Substance and Sexual Activity  Alcohol Use No   Comment: occ     Social History   Substance and Sexual Activity  Drug Use No   Comment: Pt denied    Social History   Socioeconomic History   Marital status: Widowed    Spouse name: Not on file   Number of children: Not on file   Years of education: Not on file   Highest education level: Not on file  Occupational History   Occupation: on disability  Tobacco Use   Smoking status: Former    Packs/day: 1.00    Years: 33.00    Pack years: 33.00    Types: Cigarettes   Smokeless tobacco: Never  Vaping Use   Vaping Use: Never used  Substance and Sexual Activity   Alcohol use: No    Comment: occ   Drug use: No    Comment: Pt denied   Sexual activity: Not Currently  Other Topics Concern   Not on file  Social History Narrative   Pt is a widower.  He lives alone, and he receives SSI Disability.  Pt was followed by Dr. Darleene Cleaver, but he has not seen Dr. Darleene Cleaver in several months.   Social Determinants of Health   Financial Resource Strain: Not on file  Food Insecurity: Not on file  Transportation Needs: Not on file  Physical Activity: Not on file  Stress: Not on file  Social Connections:  Not on file   SDOH:  SDOH Screenings   Alcohol Screen: Not on file  Depression (PHQ2-9): Not on file  Financial Resource Strain: Not on file  Food Insecurity: Not on file  Housing: Not on file  Physical Activity: Not on file  Social Connections: Not on file  Stress: Not on file  Tobacco Use: Medium Risk   Smoking Tobacco Use: Former   Smokeless Tobacco Use: Never   Passive Exposure: Not on file  Transportation Needs: Not on file   Additional Social History:    Pain Medications: See MAR Prescriptions: See MAR Over the Counter: See MAR History of alcohol / drug use?: Yes (uta) Longest period of sobriety (when/how long): 10 Years reported with no alcohol of marijuana use.  Negative Consequences of Use:  (Denies) Withdrawal  Symptoms:  (denies)      Sleep: Fair  Appetite:  Fair  Current Medications:  Current Facility-Administered Medications  Medication Dose Route Frequency Provider Last Rate Last Admin   acetaminophen (TYLENOL) tablet 650 mg  650 mg Oral Q6H PRN Ajibola, Ene A, NP   650 mg at 09/16/21 0840   alum & mag hydroxide-simeth (MAALOX/MYLANTA) 200-200-20 MG/5ML suspension 30 mL  30 mL Oral Q4H PRN Ajibola, Ene A, NP       hydrOXYzine (ATARAX) tablet 25 mg  25 mg Oral TID PRN Ajibola, Ene A, NP       LORazepam (ATIVAN) tablet 1 mg  1 mg Oral Q6H PRN White, Patrice L, NP       Or   LORazepam (ATIVAN) injection 1 mg  1 mg Intramuscular Q6H PRN White, Patrice L, NP   1 mg at 09/16/21 1209   magnesium hydroxide (MILK OF MAGNESIA) suspension 30 mL  30 mL Oral Daily PRN Ajibola, Ene A, NP       OLANZapine zydis (ZYPREXA) disintegrating tablet 10 mg  10 mg Oral QHS White, Patrice L, NP   10 mg at 09/16/21 2119   OLANZapine zydis (ZYPREXA) disintegrating tablet 5 mg  5 mg Oral Once Revonda Humphrey, NP       traZODone (DESYREL) tablet 50 mg  50 mg Oral QHS PRN Ajibola, Ene A, NP       Current Outpatient Medications  Medication Sig Dispense Refill   ALPRAZolam (XANAX) 1 MG tablet Take 1 mg by mouth 3 (three) times daily as needed for anxiety. LF 08/14/21     albuterol (VENTOLIN HFA) 108 (90 Base) MCG/ACT inhaler Inhale 2 puffs into the lungs every 4 (four) hours as needed for wheezing or shortness of breath. (Patient taking differently: Inhale 2 puffs into the lungs every 4 (four) hours as needed for wheezing or shortness of breath. LF 07/06/21) 18 g 1   INVEGA SUSTENNA 156 MG/ML SUSY injection Inject 156 mg into the muscle every 28 (twenty-eight) days. LF 08/21/21      Labs  Lab Results:  Admission on 09/14/2021  Component Date Value Ref Range Status   SARS Coronavirus 2 by RT PCR 09/14/2021 NEGATIVE  NEGATIVE Final   Comment: (NOTE) SARS-CoV-2 target nucleic acids are NOT DETECTED.  The SARS-CoV-2  RNA is generally detectable in upper respiratory specimens during the acute phase of infection. The lowest concentration of SARS-CoV-2 viral copies this assay can detect is 138 copies/mL. A negative result does not preclude SARS-Cov-2 infection and should not be used as the sole basis for treatment or other patient management decisions. A negative result may occur with  improper specimen collection/handling, submission  of specimen other than nasopharyngeal swab, presence of viral mutation(s) within the areas targeted by this assay, and inadequate number of viral copies(<138 copies/mL). A negative result must be combined with clinical observations, patient history, and epidemiological information. The expected result is Negative.  Fact Sheet for Patients:  EntrepreneurPulse.com.au  Fact Sheet for Healthcare Providers:  IncredibleEmployment.be  This test is no                          t yet approved or cleared by the [place] FDA and  has been authorized for detection and/or diagnosis of SARS-CoV-2 by FDA under an Emergency Use Authorization (EUA). This EUA will remain  in effect (meaning this test can be used) for the duration of the COVID-19 declaration under Section 564(b)(1) of the Act, 21 U.S.C.section 360bbb-3(b)(1), unless the authorization is terminated  or revoked sooner.       Influenza A by PCR 09/14/2021 NEGATIVE  NEGATIVE Final   Influenza B by PCR 09/14/2021 NEGATIVE  NEGATIVE Final   Comment: (NOTE) The Xpert Xpress SARS-CoV-2/FLU/RSV plus assay is intended as an aid in the diagnosis of influenza from Nasopharyngeal swab specimens and should not be used as a sole basis for treatment. Nasal washings and aspirates are unacceptable for Xpert Xpress SARS-CoV-2/FLU/RSV testing.  Fact Sheet for Patients: EntrepreneurPulse.com.au  Fact Sheet for Healthcare  Providers: IncredibleEmployment.be  This test is not yet approved or cleared by the [place] FDA and has been authorized for detection and/or diagnosis of SARS-CoV-2 by FDA under an Emergency Use Authorization (EUA). This EUA will remain in effect (meaning this test can be used) for the duration of the COVID-19 declaration under Section 564(b)(1) of the Act, 21 U.S.C. section 360bbb-3(b)(1), unless the authorization is terminated or revoked.  Performed at [facility], [place] [address] [place], [place] [address]    WBC 09/14/2021 10.2  4.0 - 10.5 K/uL Final   RBC 09/14/2021 5.54  4.22 - 5.81 MIL/uL Final   Hemoglobin 09/14/2021 16.3  13.0 - 17.0 g/dL Final   HCT 09/14/2021 48.3  39.0 - 52.0 % Final   MCV 09/14/2021 87.2  80.0 - 100.0 fL Final   MCH 09/14/2021 29.4  26.0 - 34.0 pg Final   MCHC 09/14/2021 33.7  30.0 - 36.0 g/dL Final   RDW 09/14/2021 12.0  11.5 - 15.5 % Final   Platelets 09/14/2021 230  150 - 400 K/uL Final   nRBC 09/14/2021 0.0  0.0 - 0.2 % Final   Neutrophils Relative % 09/14/2021 67  % Final   Neutro Abs 09/14/2021 6.8  1.7 - 7.7 K/uL Final   Lymphocytes Relative 09/14/2021 24  % Final   Lymphs Abs 09/14/2021 2.5  0.7 - 4.0 K/uL Final   Monocytes Relative 09/14/2021 7  % Final   Monocytes Absolute 09/14/2021 0.7  0.1 - 1.0 K/uL Final   Eosinophils Relative 09/14/2021 1  % Final   Eosinophils Absolute 09/14/2021 0.1  0.0 - 0.5 K/uL Final   Basophils Relative 09/14/2021 1  % Final   Basophils Absolute 09/14/2021 0.1  0.0 - 0.1 K/uL Final   Immature Granulocytes 09/14/2021 0  % Final   Abs Immature Granulocytes 09/14/2021 0.04  0.00 - 0.07 K/uL Final   Performed at [facility], [place] [address] [place], [place] [address]   Sodium 09/14/2021 134 (L)  135 - 145 mmol/L Final   Potassium 09/14/2021 3.5  3.5 - 5.1 mmol/L Final  Chloride 09/14/2021 105  98 - 111 mmol/L Final   CO2 09/14/2021 24  22 - 32 mmol/L Final   Glucose, Bld  09/14/2021 92  70 - 99 mg/dL Final   Glucose reference range applies only to samples taken after fasting for at least 8 hours.   BUN 09/14/2021 <5 (L)  6 - 20 mg/dL Final   Creatinine, Ser 09/14/2021 0.77  0.61 - 1.24 mg/dL Final   Calcium 09/14/2021 9.4  8.9 - 10.3 mg/dL Final   Total Protein 09/14/2021 7.2  6.5 - 8.1 g/dL Final   Albumin 09/14/2021 4.2  3.5 - 5.0 g/dL Final   AST 09/14/2021 20  15 - 41 U/L Final   ALT 09/14/2021 23  0 - 44 U/L Final   Alkaline Phosphatase 09/14/2021 63  38 - 126 U/L Final   Total Bilirubin 09/14/2021 0.7  0.3 - 1.2 mg/dL Final   GFR, Estimated 09/14/2021 >60  >60 mL/min Final   Comment: (NOTE) Calculated using the CKD-EPI Creatinine Equation (2021)    Anion gap 09/14/2021 5  5 - 15 Final   Performed at [facility] [address] [place], [place] [address]   Hgb A1c MFr Bld 09/14/2021 5.5  4.8 - 5.6 % Final   Comment: (NOTE) Pre diabetes:          5.7%-6.4%  Diabetes:              >6.4%  Glycemic control for   <7.0% adults with diabetes    Mean Plasma Glucose 09/14/2021 111.15  mg/dL Final   Performed at [facility], [place] [address] [place], [place] [address]   Cholesterol 09/14/2021 185  0 - 200 mg/dL Final   Triglycerides 09/14/2021 137  <150 mg/dL Final   HDL 09/14/2021 44  >40 mg/dL Final   Total CHOL/HDL Ratio 09/14/2021 4.2  RATIO Final   VLDL 09/14/2021 27  0 - 40 mg/dL Final   LDL Cholesterol 09/14/2021 114 (H)  0 - 99 mg/dL Final   Comment:        Total Cholesterol/HDL:CHD Risk Coronary Heart Disease Risk Table                     Men   Women  1/2 Average Risk   3.4   3.3  Average Risk       5.0   4.4  2 X Average Risk   9.6   7.1  3 X Average Risk  23.4   11.0        Use the calculated Patient Ratio above and the CHD Risk Table to determine the patient's CHD Risk.        ATP III CLASSIFICATION (LDL):  <100     mg/dL   Optimal  100-129  mg/dL   Near or Above                    Optimal  130-159  mg/dL    Borderline  160-189  mg/dL   High  >190     mg/dL   Very High Performed at [facility] [address], [place], [place] [address]    TSH 09/14/2021 1.648  0.350 - 4.500 uIU/mL Final   Comment: Performed by a 3rd Generation assay with a functional sensitivity of <=0.01 uIU/mL. Performed at [facility], [place] [address] [place], [place] [address]    Prolactin 09/14/2021 20.8 (H)  4.0 - 15.2 ng/mL Final  Comment: (NOTE) Performed At: [facility] [place], [place] [id number] Rush Farmer MD OJ:[id number]    POC Amphetamine UR 09/14/2021 None Detected  NONE DETECTED (Cut Off Level 1000 ng/mL) Final   POC Secobarbital (BAR) 09/14/2021 None Detected  NONE DETECTED (Cut Off Level 300 ng/mL) Final   POC Buprenorphine (BUP) 09/14/2021 None Detected  NONE DETECTED (Cut Off Level 10 ng/mL) Final   POC Oxazepam (BZO) 09/14/2021 None Detected  NONE DETECTED (Cut Off Level 300 ng/mL) Final   POC Cocaine UR 09/14/2021 None Detected  NONE DETECTED (Cut Off Level 300 ng/mL) Final   POC Methamphetamine UR 09/14/2021 None Detected  NONE DETECTED (Cut Off Level 1000 ng/mL) Final   POC Morphine 09/14/2021 None Detected  NONE DETECTED (Cut Off Level 300 ng/mL) Final   POC Oxycodone UR 09/14/2021 None Detected  NONE DETECTED (Cut Off Level 100 ng/mL) Final   POC Methadone UR 09/14/2021 None Detected  NONE DETECTED (Cut Off Level 300 ng/mL) Final   POC Marijuana UR 09/14/2021 None Detected  NONE DETECTED (Cut Off Level 50 ng/mL) Final   SARS Coronavirus 2 Ag 09/14/2021 Negative  Negative Preliminary  Admission on 09/04/2021, Discharged on 09/04/2021  Component Date Value Ref Range Status   SARS Coronavirus 2 by RT PCR 09/04/2021 NEGATIVE  NEGATIVE Final   Comment: (NOTE) SARS-CoV-2 target nucleic acids are NOT DETECTED.  The SARS-CoV-2 RNA is generally detectable in upper respiratory specimens during the acute phase of infection. The lowest concentration of SARS-CoV-2  viral copies this assay can detect is 138 copies/mL. A negative result does not preclude SARS-Cov-2 infection and should not be used as the sole basis for treatment or other patient management decisions. A negative result may occur with  improper specimen collection/handling, submission of specimen other than nasopharyngeal swab, presence of viral mutation(s) within the areas targeted by this assay, and inadequate number of viral copies(<138 copies/mL). A negative result must be combined with clinical observations, patient history, and epidemiological information. The expected result is Negative.  Fact Sheet for Patients:  EntrepreneurPulse.com.au  Fact Sheet for Healthcare Providers:  IncredibleEmployment.be  This test is no                          t yet approved or cleared by the [place] FDA and  has been authorized for detection and/or diagnosis of SARS-CoV-2 by FDA under an Emergency Use Authorization (EUA). This EUA will remain  in effect (meaning this test can be used) for the duration of the COVID-19 declaration under Section 564(b)(1) of the Act, 21 U.S.C.section 360bbb-3(b)(1), unless the authorization is terminated  or revoked sooner.       Influenza A by PCR 09/04/2021 NEGATIVE  NEGATIVE Final   Influenza B by PCR 09/04/2021 NEGATIVE  NEGATIVE Final   Comment: (NOTE) The Xpert Xpress SARS-CoV-2/FLU/RSV plus assay is intended as an aid in the diagnosis of influenza from Nasopharyngeal swab specimens and should not be used as a sole basis for treatment. Nasal washings and aspirates are unacceptable for Xpert Xpress SARS-CoV-2/FLU/RSV testing.  Fact Sheet for Patients: EntrepreneurPulse.com.au  Fact Sheet for Healthcare Providers: IncredibleEmployment.be  This test is not yet approved or cleared by the [place] FDA and has been authorized for detection and/or diagnosis of SARS-CoV-2  by FDA under an Emergency Use Authorization (EUA). This EUA will remain in effect (meaning this test can be used) for the duration of the COVID-19 declaration under Section 564(b)(1) of the  Act, 21 U.S.C. section 360bbb-3(b)(1), unless the authorization is terminated or revoked.  Performed at [facility], [address], [place], [place] [address]   Admission on 06/27/2021, Discharged on 06/27/2021  Component Date Value Ref Range Status   WBC 06/27/2021 10.7 (H)  4.0 - 10.5 K/uL Final   RBC 06/27/2021 4.95  4.22 - 5.81 MIL/uL Final   Hemoglobin 06/27/2021 14.5  13.0 - 17.0 g/dL Final   HCT 06/27/2021 44.9  39.0 - 52.0 % Final   MCV 06/27/2021 90.7  80.0 - 100.0 fL Final   MCH 06/27/2021 29.3  26.0 - 34.0 pg Final   MCHC 06/27/2021 32.3  30.0 - 36.0 g/dL Final   RDW 06/27/2021 12.9  11.5 - 15.5 % Final   Platelets 06/27/2021 230  150 - 400 K/uL Final   nRBC 06/27/2021 0.0  0.0 - 0.2 % Final   Neutrophils Relative % 06/27/2021 68  % Final   Neutro Abs 06/27/2021 7.4  1.7 - 7.7 K/uL Final   Lymphocytes Relative 06/27/2021 24  % Final   Lymphs Abs 06/27/2021 2.5  0.7 - 4.0 K/uL Final   Monocytes Relative 06/27/2021 6  % Final   Monocytes Absolute 06/27/2021 0.6  0.1 - 1.0 K/uL Final   Eosinophils Relative 06/27/2021 1  % Final   Eosinophils Absolute 06/27/2021 0.1  0.0 - 0.5 K/uL Final   Basophils Relative 06/27/2021 1  % Final   Basophils Absolute 06/27/2021 0.1  0.0 - 0.1 K/uL Final   Immature Granulocytes 06/27/2021 0  % Final   Abs Immature Granulocytes 06/27/2021 0.03  0.00 - 0.07 K/uL Final   Performed at [facility], [place] [address] [place], [place] [address]   Sodium 06/27/2021 140  135 - 145 mmol/L Final   Potassium 06/27/2021 3.6  3.5 - 5.1 mmol/L Final   Chloride 06/27/2021 106  98 - 111 mmol/L Final   CO2 06/27/2021 22  22 - 32 mmol/L Final   Glucose, Bld 06/27/2021 164 (H)  70 - 99 mg/dL Final   Glucose reference range applies only to  samples taken after fasting for at least 8 hours.   BUN 06/27/2021 5 (L)  6 - 20 mg/dL Final   Creatinine, Ser 06/27/2021 0.99  0.61 - 1.24 mg/dL Final   Calcium 06/27/2021 9.5  8.9 - 10.3 mg/dL Final   Total Protein 06/27/2021 6.6  6.5 - 8.1 g/dL Final   Albumin 06/27/2021 3.8  3.5 - 5.0 g/dL Final   AST 06/27/2021 27  15 - 41 U/L Final   ALT 06/27/2021 20  0 - 44 U/L Final   Alkaline Phosphatase 06/27/2021 50  38 - 126 U/L Final   Total Bilirubin 06/27/2021 0.5  0.3 - 1.2 mg/dL Final   GFR, Estimated 06/27/2021 >60  >60 mL/min Final   Comment: (NOTE) Calculated using the CKD-EPI Creatinine Equation (2021)    Anion gap 06/27/2021 12  5 - 15 Final   Performed at [facility] [address], [place], [place] [address]   Color, Urine 06/27/2021 YELLOW  YELLOW Final   APPearance 06/27/2021 CLEAR  CLEAR Final   Specific Gravity, Urine 06/27/2021 1.009  1.005 - 1.030 Final   pH 06/27/2021 6.0  5.0 - 8.0 Final   Glucose, UA 06/27/2021 NEGATIVE  NEGATIVE mg/dL Final   Hgb urine dipstick 06/27/2021 NEGATIVE  NEGATIVE Final   Bilirubin Urine 06/27/2021 NEGATIVE  NEGATIVE Final   Ketones, ur 06/27/2021 NEGATIVE  NEGATIVE mg/dL Final  Protein, ur 06/27/2021 NEGATIVE  NEGATIVE mg/dL Final   Nitrite 06/27/2021 NEGATIVE  NEGATIVE Final   Leukocytes,Ua 06/27/2021 NEGATIVE  NEGATIVE Final   Performed at [facility], [place] [address], [place], [place] [address]  Admission on 04/27/2021, Discharged on 04/28/2021  Component Date Value Ref Range Status   Sodium 04/27/2021 140  135 - 145 mmol/L Final   Potassium 04/27/2021 3.5  3.5 - 5.1 mmol/L Final   Chloride 04/27/2021 108  98 - 111 mmol/L Final   CO2 04/27/2021 26  22 - 32 mmol/L Final   Glucose, Bld 04/27/2021 102 (H)  70 - 99 mg/dL Final   Glucose reference range applies only to samples taken after fasting for at least 8 hours.   BUN 04/27/2021 <5 (L)  6 - 20 mg/dL Final   Creatinine, Ser 04/27/2021 0.88  0.61 - 1.24 mg/dL Final    Calcium 04/27/2021 9.6  8.9 - 10.3 mg/dL Final   Total Protein 04/27/2021 6.5  6.5 - 8.1 g/dL Final   Albumin 04/27/2021 3.9  3.5 - 5.0 g/dL Final   AST 04/27/2021 20  15 - 41 U/L Final   ALT 04/27/2021 22  0 - 44 U/L Final   Alkaline Phosphatase 04/27/2021 47  38 - 126 U/L Final   Total Bilirubin 04/27/2021 0.6  0.3 - 1.2 mg/dL Final   GFR, Estimated 04/27/2021 >60  >60 mL/min Final   Comment: (NOTE) Calculated using the CKD-EPI Creatinine Equation (2021)    Anion gap 04/27/2021 6  5 - 15 Final   Performed at [facility] [address] [place], [place] [address]   Acetaminophen (Tylenol), Serum 04/27/2021 <10 (L)  10 - 30 ug/mL Final   Comment: (NOTE) Therapeutic concentrations vary significantly. A range of 10-30 ug/mL  may be an effective concentration for many patients. However, some  are best treated at concentrations outside of this range. Acetaminophen concentrations >150 ug/mL at 4 hours after ingestion  and >50 ug/mL at 12 hours after ingestion are often associated with  toxic reactions.  Performed at [facility], [place] [address], [place], [place] [address]    Alcohol, Ethyl (B) 04/27/2021 <10  <10 mg/dL Final   Comment: (NOTE) Lowest detectable limit for serum alcohol is 10 mg/dL.  For medical purposes only. Performed at [facility], [place] [address], [place], [place] [address]    Troponin I (High Sensitivity) 04/27/2021 4  <18 ng/L Final   Comment: (NOTE) Elevated high sensitivity troponin I (hsTnI) values and significant  changes across serial measurements may suggest ACS but many other  chronic and acute conditions are known to elevate hsTnI results.  Refer to the Links section for chest pain algorithms and additional  guidance. Performed at [facility], [place] [address] [place], [place] [address]    Salicylate Lvl 41/32/4401 <7.0 (L)  7.0 - 30.0 mg/dL Final   Performed at [facility] [address] [place], [place] [address]    WBC 04/27/2021 8.3  4.0 - 10.5 K/uL Final   RBC 04/27/2021 4.94  4.22 - 5.81 MIL/uL Final   Hemoglobin 04/27/2021 14.7  13.0 - 17.0 g/dL Final   HCT 04/27/2021 44.1  39.0 - 52.0 % Final   MCV 04/27/2021 89.3  80.0 - 100.0 fL Final   MCH 04/27/2021 29.8  26.0 - 34.0 pg Final   MCHC 04/27/2021 33.3  30.0 - 36.0 g/dL Final   RDW 04/27/2021 11.8  11.5 -  15.5 % Final   Platelets 04/27/2021 195  150 - 400 K/uL Final   nRBC 04/27/2021 0.0  0.0 - 0.2 % Final   Neutrophils Relative % 04/27/2021 68  % Final   Neutro Abs 04/27/2021 5.7  1.7 - 7.7 K/uL Final   Lymphocytes Relative 04/27/2021 22  % Final   Lymphs Abs 04/27/2021 1.9  0.7 - 4.0 K/uL Final   Monocytes Relative 04/27/2021 8  % Final   Monocytes Absolute 04/27/2021 0.7  0.1 - 1.0 K/uL Final   Eosinophils Relative 04/27/2021 1  % Final   Eosinophils Absolute 04/27/2021 0.1  0.0 - 0.5 K/uL Final   Basophils Relative 04/27/2021 1  % Final   Basophils Absolute 04/27/2021 0.0  0.0 - 0.1 K/uL Final   Immature Granulocytes 04/27/2021 0  % Final   Abs Immature Granulocytes 04/27/2021 0.02  0.00 - 0.07 K/uL Final   Performed at [facility], [place] [address] [place], [place] [address]   SARS Coronavirus 2 by RT PCR 04/27/2021 NEGATIVE  NEGATIVE Final   Comment: (NOTE) SARS-CoV-2 target nucleic acids are NOT DETECTED.  The SARS-CoV-2 RNA is generally detectable in upper respiratory specimens during the acute phase of infection. The lowest concentration of SARS-CoV-2 viral copies this assay can detect is 138 copies/mL. A negative result does not preclude SARS-Cov-2 infection and should not be used as the sole basis for treatment or other patient management decisions. A negative result may occur with  improper specimen collection/handling, submission of specimen other than nasopharyngeal swab, presence of viral mutation(s) within the areas targeted by this assay, and inadequate number of viral copies(<138 copies/mL). A negative result  must be combined with clinical observations, patient history, and epidemiological information. The expected result is Negative.  Fact Sheet for Patients:  EntrepreneurPulse.com.au  Fact Sheet for Healthcare Providers:  IncredibleEmployment.be  This test is no                          t yet approved or cleared by the [place] FDA and  has been authorized for detection and/or diagnosis of SARS-CoV-2 by FDA under an Emergency Use Authorization (EUA). This EUA will remain  in effect (meaning this test can be used) for the duration of the COVID-19 declaration under Section 564(b)(1) of the Act, 21 U.S.C.section 360bbb-3(b)(1), unless the authorization is terminated  or revoked sooner.       Influenza A by PCR 04/27/2021 NEGATIVE  NEGATIVE Final   Influenza B by PCR 04/27/2021 NEGATIVE  NEGATIVE Final   Comment: (NOTE) The Xpert Xpress SARS-CoV-2/FLU/RSV plus assay is intended as an aid in the diagnosis of influenza from Nasopharyngeal swab specimens and should not be used as a sole basis for treatment. Nasal washings and aspirates are unacceptable for Xpert Xpress SARS-CoV-2/FLU/RSV testing.  Fact Sheet for Patients: EntrepreneurPulse.com.au  Fact Sheet for Healthcare Providers: IncredibleEmployment.be  This test is not yet approved or cleared by the [place] FDA and has been authorized for detection and/or diagnosis of SARS-CoV-2 by FDA under an Emergency Use Authorization (EUA). This EUA will remain in effect (meaning this test can be used) for the duration of the COVID-19 declaration under Section 564(b)(1) of the Act, 21 U.S.C. section 360bbb-3(b)(1), unless the authorization is terminated or revoked.  Performed at [facility], [place] [address], [place],  [address]   Admission on 04/21/2021, Discharged on 04/21/2021  Component Date Value Ref Range Status   Troponin I (  High  Sensitivity) 04/21/2021 4  <18 ng/L Final   Comment: (NOTE) Elevated high sensitivity troponin I (hsTnI) values and significant  changes across serial measurements may suggest ACS but many other  chronic and acute conditions are known to elevate hsTnI results.  Refer to the "Links" section for chest pain algorithms and additional  guidance. Performed at [facility], [place] [address] [place], [place] [address]    Sodium 04/21/2021 140  135 - 145 mmol/L Final   Potassium 04/21/2021 3.7  3.5 - 5.1 mmol/L Final   Chloride 04/21/2021 107  98 - 111 mmol/L Final   CO2 04/21/2021 26  22 - 32 mmol/L Final   Glucose, Bld 04/21/2021 92  70 - 99 mg/dL Final   Glucose reference range applies only to samples taken after fasting for at least 8 hours.   BUN 04/21/2021 6  6 - 20 mg/dL Final   Creatinine, Ser 04/21/2021 0.81  0.61 - 1.24 mg/dL Final   Calcium 04/21/2021 9.7  8.9 - 10.3 mg/dL Final   GFR, Estimated 04/21/2021 >60  >60 mL/min Final   Comment: (NOTE) Calculated using the CKD-EPI Creatinine Equation (2021)    Anion gap 04/21/2021 7  5 - 15 Final   Performed at [facility], [place] [address] [place], [place] [address]   WBC 04/21/2021 10.3  4.0 - 10.5 K/uL Final   RBC 04/21/2021 5.52  4.22 - 5.81 MIL/uL Final   Hemoglobin 04/21/2021 16.2  13.0 - 17.0 g/dL Final   HCT 04/21/2021 48.2  39.0 - 52.0 % Final   MCV 04/21/2021 87.3  80.0 - 100.0 fL Final   MCH 04/21/2021 29.3  26.0 - 34.0 pg Final   MCHC 04/21/2021 33.6  30.0 - 36.0 g/dL Final   RDW 04/21/2021 11.7  11.5 - 15.5 % Final   Platelets 04/21/2021 218  150 - 400 K/uL Final   nRBC 04/21/2021 0.0  0.0 - 0.2 % Final   Performed at [facility], [place] [address] [place], [place] [address]   B Natriuretic Peptide 04/21/2021 13.0  0.0 - 100.0 pg/mL Final   Performed at [place] [address], [place], [place] [address]   D-Dimer, America Brown 04/21/2021 <0.27  0.00  - 0.50 ug/mL-FEU Final   Comment: (NOTE) At the manufacturer cut-off value of 0.5 g/mL FEU, this assay has a negative predictive value of 95-100%.This assay is intended for use in conjunction with a clinical pretest probability (PTP) assessment model to exclude pulmonary embolism (PE) and deep venous thrombosis (DVT) in outpatients suspected of PE or DVT. Results should be correlated with clinical presentation. Performed at [facility], [place] [address], [place], [place] [address]     Blood Alcohol level:  Lab Results  Component Value Date   [facility] <10 04/27/2021   ETH <10 67/54/4920    Metabolic Disorder Labs: Lab Results  Component Value Date   HGBA1C 5.5 09/14/2021   MPG 111.15 09/14/2021   MPG 114.02 01/17/2020   Lab Results  Component Value Date   PROLACTIN 20.8 (H) 09/14/2021   PROLACTIN 16.1 (H) 01/17/2020   Lab Results  Component Value Date   CHOL 185 09/14/2021   TRIG 137 09/14/2021   HDL 44 09/14/2021   CHOLHDL 4.2 09/14/2021   VLDL 27 09/14/2021   LDLCALC 114 (H) 09/14/2021   LDLCALC 84 01/17/2020    Therapeutic Lab Levels: No results found for: LITHIUM Lab Results  Component Value Date  VALPROATE <10 (L) 12/07/2020   VALPROATE 43 (L) 01/31/2020   No components found for:  CBMZ  Physical Findings   AIMS    Flowsheet Row Admission (Discharged) from 08/19/2017 in [place] [facility] Admission (Discharged) from 06/16/2017 in [place] [facility]  AIMS Total Score 0 0      AUDIT    Flowsheet Row Admission (Discharged) from 01/16/2020 in [place] [facility]B Admission (Discharged) from 08/19/2017 in [place] 500B Admission (Discharged) from 06/16/2017 in [place] 500B ED to Hosp-Admission (Discharged) from 02/26/2014 in [place] 400B  Alcohol Use Disorder  Identification Test Final Score (AUDIT) 0 0 0 10      Flowsheet Row ED from 09/04/2021 in [facility] ED from 06/27/2021 in [place] ED from 05/29/2021 in [facility] [place] No Risk No Risk No Risk        Musculoskeletal  Strength & Muscle Tone: within normal limits Gait & Station: normal Patient leans: N/A  Psychiatric Specialty Exam  Presentation  General Appearance: Disheveled  Eye Contact:Minimal  Speech:Clear and Coherent; Normal Rate  Speech Volume:Normal  Handedness:Right   Mood and Affect  Mood:Anxious  Affect:Congruent   Thought Process  Thought Processes:Disorganized  Descriptions of Associations:Tangential  Orientation:Partial  Thought Content:Logical  Diagnosis of Schizophrenia or Schizoaffective disorder in past: Yes  Duration of Psychotic Symptoms: Greater than six months   Hallucinations:Hallucinations: None  Ideas of Reference:None  Suicidal Thoughts:Suicidal Thoughts: No  Homicidal Thoughts:Homicidal Thoughts: No   Sensorium  Memory:Immediate Poor; Recent Poor; Remote Poor  Judgment:Impaired  Insight:Lacking   Executive Functions  Concentration:Fair  Attention Span:Fair  [place]   Psychomotor Activity  Psychomotor Activity:Psychomotor Activity: [place]; Resilience   Sleep  Sleep:Sleep: Fair   No data recorded  Physical Exam  Physical Exam Vitals and nursing note reviewed.  Constitutional:      Appearance: He is well-developed.  HENT:     Head: Normocephalic and atraumatic.  Eyes:     General:        Right eye: No discharge.        Left eye: No discharge.     Conjunctiva/sclera: Conjunctivae normal.  Cardiovascular:     Rate and Rhythm: Normal rate.  Pulmonary:     Effort: Pulmonary effort is normal. No  respiratory distress.  Musculoskeletal:        General: Normal range of motion.     Cervical back: Neck supple.  Skin:    Coloration: Skin is not jaundiced or pale.  Neurological:     Mental Status: He is alert.  Psychiatric:        Attention and Perception: Attention normal.        Mood and Affect: Mood is anxious.        Speech: Speech is rapid and pressured and tangential.        Behavior: Behavior is cooperative.        Thought Content: Thought content is paranoid and delusional.        Cognition and Memory: Cognition normal.        Judgment: Judgment is impulsive.   Review of Systems  Constitutional: Negative.   HENT: Negative.    Eyes: Negative.   Respiratory: Negative.    Cardiovascular: Negative.   Musculoskeletal: Negative.  Skin: Negative.   Neurological: Negative.   Psychiatric/Behavioral:  The patient is nervous/anxious.   Blood pressure 115/78, pulse 73, temperature 98.1 F (36.7 C), temperature source Oral, resp. rate 18, SpO2 98 %. There is no height or weight on file to calculate BMI.  Treatment Plan Summary: Daily contact with patient to assess and evaluate symptoms and progress in treatment and Medication management.   Disposition: patient continues to meet inpatient psychiatric admission criteria. [facility] has denied. SW contacted and patient has been faxed out  Zyprexa 5 mg PO QAM was added in addition to his current Zyprexa 10 mg PO QHS.   Revonda Humphrey, NP 09/17/2021 3:52 PM

## 2021-09-17 NOTE — ED Notes (Signed)
[name] speaking loudly with much profanity stated " I'm sick of you fuckin fangots getting on my nerves" " I don't have to take this shit I'll blow this fucker up" . [name] was asked why he was angry and if someone had upset him? He refused to answer the question directly and continued his triade  of profanity. He was offered medication to claim him down and replied with another string of profanity. He was informed that I was going to get IM medication to help him relax but when I returned with the medication he was quiet and appeared to be asleep.

## 2021-09-17 NOTE — ED Notes (Signed)
Pt not requiring any further intervention behavior is appropriate.

## 2021-09-17 NOTE — ED Notes (Signed)
Patient is awake and rambling continuously.  He is irritable, hostile and uncooperative.  He expresses paranoid ideas and has persecutory thoughts.  Insight into mental health issues is poor as he believes he has medical problems which are the contributory causes of his present issue.  Patient can be inflammatory in his verbal rambling with racist and sexist remarks.  He is disruptive, demanding and illogical.  Patient ate breakfast and is presently taking a shower.  He has adamantly refused medication and is very vocal about not taking meds.  He has paranoid ideas about being poisoned or of having drunk water/ soda which caused a "crick in his neck" which resulted in his need for hospital care.  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  He denies avh however has made vague references to shooting family members who "put me here."  Encouraged patient to seek out writer to have needs met and to attempt to verbalize needs prior to becoming agitated.  Will monitor and provide safety to patient, peers and staff.  He is presently a low threshold for PRNS as he is very unpredictable, hostile and oppositional.

## 2021-09-17 NOTE — ED Notes (Signed)
Pt received breakfast 

## 2021-09-17 NOTE — Progress Notes (Signed)
Patient was loud earlier this morning however writer has had numerous conversations with him and attempts are being made to anticipate and meet needs prior to escalation of mood and behavior.  He took a shower and washed his face with toothpaste.  He is calmer however continues to ramble with paranoid ideas and mumbling to self.  He was given lunch and is now eating.  No aggressive behavior at this time.  Will continue to monitor and provide support and limits as needed.

## 2021-09-17 NOTE — ED Notes (Signed)
Mali had a few challenges tonight with occasional emotional out bursted but was able to be verbally de-escalated when offered IM ativan after refusing po medication.

## 2021-09-17 NOTE — ED Notes (Signed)
Sleeping intermittantly no c/o pain respiration 14 BPM and and easy.

## 2021-09-17 NOTE — ED Notes (Signed)
[name] initially refused his medication but later  agree to take it over all less aggressive tonight.

## 2021-09-18 ENCOUNTER — Other Ambulatory Visit: Payer: Self-pay

## 2021-09-18 ENCOUNTER — Inpatient Hospital Stay (HOSPITAL_COMMUNITY)
Admission: AD | Admit: 2021-09-18 | Discharge: 2021-09-23 | DRG: 885 | Disposition: A | Discharge status: Home / Self Care | Payer: Medicare Other | Source: Intra-hospital | Attending: Emergency Medicine | Admitting: Emergency Medicine

## 2021-09-18 ENCOUNTER — Encounter (HOSPITAL_COMMUNITY): Payer: Self-pay | Admitting: Psychiatry

## 2021-09-18 DIAGNOSIS — F250 Schizoaffective disorder, bipolar type: Principal | ICD-10-CM | POA: Diagnosis present

## 2021-09-18 DIAGNOSIS — Z9114 Patient's other noncompliance with medication regimen: Secondary | ICD-10-CM

## 2021-09-18 DIAGNOSIS — M479 Spondylosis, unspecified: Secondary | ICD-10-CM | POA: Diagnosis present

## 2021-09-18 DIAGNOSIS — F209 Schizophrenia, unspecified: Secondary | ICD-10-CM | POA: Insufficient documentation

## 2021-09-18 DIAGNOSIS — K5900 Constipation, unspecified: Secondary | ICD-10-CM | POA: Diagnosis present

## 2021-09-18 DIAGNOSIS — K30 Functional dyspepsia: Secondary | ICD-10-CM | POA: Diagnosis present

## 2021-09-18 DIAGNOSIS — M179 Osteoarthritis of knee, unspecified: Secondary | ICD-10-CM | POA: Insufficient documentation

## 2021-09-18 DIAGNOSIS — G8929 Other chronic pain: Secondary | ICD-10-CM | POA: Diagnosis present

## 2021-09-18 DIAGNOSIS — M542 Cervicalgia: Secondary | ICD-10-CM | POA: Diagnosis present

## 2021-09-18 DIAGNOSIS — M546 Pain in thoracic spine: Secondary | ICD-10-CM

## 2021-09-18 DIAGNOSIS — M1991 Primary osteoarthritis, unspecified site: Secondary | ICD-10-CM | POA: Insufficient documentation

## 2021-09-18 DIAGNOSIS — M549 Dorsalgia, unspecified: Secondary | ICD-10-CM | POA: Diagnosis present

## 2021-09-18 DIAGNOSIS — Z87891 Personal history of nicotine dependence: Secondary | ICD-10-CM | POA: Diagnosis not present

## 2021-09-18 DIAGNOSIS — F411 Generalized anxiety disorder: Secondary | ICD-10-CM | POA: Diagnosis present

## 2021-09-18 DIAGNOSIS — G4700 Insomnia, unspecified: Secondary | ICD-10-CM | POA: Diagnosis present

## 2021-09-18 DIAGNOSIS — Z818 Family history of other mental and behavioral disorders: Secondary | ICD-10-CM | POA: Diagnosis not present

## 2021-09-18 DIAGNOSIS — Z880 Allergy status to penicillin: Secondary | ICD-10-CM | POA: Diagnosis not present

## 2021-09-18 MED ORDER — CHLORPROMAZINE HCL 25 MG/ML IJ SOLN: 50.0000 mg | INTRAMUSCULAR | Status: DC | PRN

## 2021-09-18 MED ORDER — LIDOCAINE 5 % EX PTCH
1.0000 | MEDICATED_PATCH | CUTANEOUS | Status: DC
  Administered 2021-09-21: 1 via TRANSDERMAL
  Filled 2021-09-18 (×7): qty 1

## 2021-09-18 MED ORDER — LORAZEPAM 1 MG PO TABS: 1.0000 mg | ORAL_TABLET | Freq: Four times a day (QID) | ORAL | Status: DC | PRN

## 2021-09-18 MED ORDER — OLANZAPINE 10 MG PO TABS: 10.0000 mg | ORAL_TABLET | Freq: Two times a day (BID) | ORAL | Status: DC | PRN

## 2021-09-18 MED ORDER — HYDROXYZINE HCL 25 MG PO TABS
25.0000 mg | ORAL_TABLET | Freq: Three times a day (TID) | ORAL | Status: DC | PRN
  Filled 2021-09-18 (×2): qty 1

## 2021-09-18 MED ORDER — LORAZEPAM 2 MG/ML IJ SOLN: 2.0000 mg | Freq: Four times a day (QID) | INTRAMUSCULAR | Status: DC | PRN

## 2021-09-18 MED ORDER — TRAZODONE HCL 50 MG PO TABS: 50.0000 mg | ORAL_TABLET | Freq: Every evening | ORAL | Status: DC | PRN

## 2021-09-18 MED ORDER — MAGNESIUM HYDROXIDE 400 MG/5ML PO SUSP: 30.0000 mL | Freq: Every day | ORAL | Status: DC | PRN

## 2021-09-18 MED ORDER — ACETAMINOPHEN 325 MG PO TABS
650.0000 mg | ORAL_TABLET | Freq: Four times a day (QID) | ORAL | Status: DC | PRN
  Administered 2021-09-20 – 2021-09-22 (×4): 650 mg via ORAL
  Filled 2021-09-18 (×4): qty 2

## 2021-09-18 MED ORDER — DIAZEPAM 5 MG PO TABS
10.0000 mg | ORAL_TABLET | Freq: Every evening | ORAL | Status: DC | PRN
  Administered 2021-09-22: 10 mg via ORAL
  Filled 2021-09-18: qty 2

## 2021-09-18 MED ORDER — ALUM & MAG HYDROXIDE-SIMETH 200-200-20 MG/5ML PO SUSP
30.0000 mL | ORAL | Status: DC | PRN
  Administered 2021-09-20: 30 mL via ORAL
  Filled 2021-09-18: qty 30

## 2021-09-18 MED ORDER — CHLORPROMAZINE HCL 25 MG PO TABS
100.0000 mg | ORAL_TABLET | ORAL | Status: DC | PRN
  Filled 2021-09-18: qty 2

## 2021-09-18 MED ORDER — OLANZAPINE 10 MG IM SOLR: 10.0000 mg | Freq: Two times a day (BID) | INTRAMUSCULAR | Status: DC | PRN

## 2021-09-18 MED ORDER — OLANZAPINE 10 MG PO TBDP
10.0000 mg | ORAL_TABLET | Freq: Every day | ORAL | Status: DC
  Filled 2021-09-18: qty 1

## 2021-09-18 MED ORDER — LORAZEPAM 2 MG/ML IJ SOLN: 1.0000 mg | Freq: Four times a day (QID) | INTRAMUSCULAR | Status: DC | PRN

## 2021-09-18 MED ORDER — DIAZEPAM 5 MG/ML IJ SOLN: 10.0000 mg | Freq: Every evening | INTRAMUSCULAR | Status: DC | PRN

## 2021-09-18 MED ORDER — CHLORPROMAZINE HCL 100 MG PO TABS
100.0000 mg | ORAL_TABLET | Freq: Three times a day (TID) | ORAL | Status: DC
  Filled 2021-09-18 (×8): qty 1

## 2021-09-18 NOTE — Progress Notes (Signed)
Patient awake, given two containers of cereal and apple juice for breakfast and asked for tylenol for back pain. Patient received 650 mg of tylenol due to back pain 9/10.

## 2021-09-18 NOTE — Progress Notes (Signed)
Patient given two towels and washcloths and bar of soap and his clothing for showering at request.

## 2021-09-18 NOTE — Progress Notes (Signed)
Admission Note: Patient is a 51 year old male admitted to the unit from [facility] under IVC for paranoia, hallucinations, depression and medication noncompliance.  Patient is alert and oriented to person, place and time.  Patient reports that family members trying to poison him.  Patient mumbled during assessment and forward little information.  Admission plan of care reviewed with consent for treatment signed.  Person belonging and skin assessment completed.  Skin is dry and intact.  No contraband found.  Oriented patient to the unit, staff and room.  Routine safety checks initiated.  Patient is safe on the unit.

## 2021-09-18 NOTE — BHH Counselor (Signed)
Adult Comprehensive Assessment   Patient ID: [name], male   DOB: [birth year], 51 y.o.   MRN: [id number]   Information Source: Information source: Patient   Current Stressors:  Patient states their primary concerns and needs for treatment are:: "Put something in my teeth" Pt continues to list off medical concerns and states he is misdiagnosed with psychiatric vs medical  Patient states their goals for this hospitilization and ongoing recovery are:: "Medical clearance" Employment / Job issues: on Engineer, building services / Lack of resources (include bankruptcy): on fixed income Family: "Think I am a psych patient" Health: States he has a broken back, cyst on his back, lymphoma, oral issues, and a TBI. Believes he has chemicals in his veins that need to be flushed out Social: Yes, believes that someone is controlling the water and adding weed killer to it which is killing people   Living/Environment/Situation:  Living Arrangements: Alone Living conditions (as described by patient or guardian): Pt lives alone in [place] in an apartment.  How long has patient lived in current situation?: 22 years What is atmosphere in current home: It's hell."   Family History:  Marital status: Widowed Widowed, when?: 2005 Does patient have children?: Yes How many children?: 1 How is patient's relationship with their children?: 38 year old son. Has not seen him in 3 years   Childhood History:  By whom was/is the patient raised?: Mother/father and step-parent;Mother Additional childhood history information: Pt reports being raised by mother and step father and reports step father was a "pot head".  Pt describes his childhood as good.  Description of patient's relationship with caregiver when they were a child: Pt reports getting along well with parents growing up.  Patient's description of current relationship with people who raised him/her: Pt reports having a "so-so"  relationship with mother-"we argue  a lot."  Father is out of prison-relationship is weird-"I hear his voice a lot" Does patient have siblings?: Yes Number of Siblings: 2 Description of patient's current relationship with siblings: reports having a strained relationship with brother, decent relationship with sister Did patient suffer any verbal/emotional/physical/sexual abuse as a child?: No Did patient suffer from severe childhood neglect?: No Has patient ever been sexually abused/assaulted/raped as an adolescent or adult?: No Was the patient ever a victim of a crime or a disaster?: No Witnessed domestic violence?: No Has patient been effected by domestic violence as an adult?: No   Education:  Highest grade of school patient has completed: some college Currently a Ship broker?: No Learning disability?: No   Employment/Work Situation:   Employment situation: On disability Why is patient on disability: mental health How long has patient been on disability: since 5 Patient's job has been impacted by current illness: No What is the longest time patient has a held a job?: 10 years 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 I like women." Has patient ever served in combat?: No   Financial Resources:   Museum/gallery curator resources: Insurance claims handler;Food stamps Does patient have a representative payee or guardian?: No   Alcohol/Substance Abuse:   What has been your use of drugs/alcohol within the last 12 months?: Denies all substance use If attempted suicide, did drugs/alcohol play a role in this?: No Alcohol/Substance Abuse Treatment Hx: Denies past history If yes, describe treatment: N/A Has alcohol/substance abuse ever caused legal problems?: No   Social Support System:   [employer]  Support System: none Describe Community Support System: Reports no support Type of faith/religion: "Do and don't"  How does patient's  faith help to cope with current illness?: prayer, occasional church attendance   Leisure/Recreation:   Leisure and Hobbies: Music   Strengths/Needs:   What things does the patient do well?: "Self preservation and defending others"   Discharge Plan:   Does patient have access to transportation?: Yes Will patient be returning to same living situation after discharge?: Yes Currently receiving community mental health services: Is established with [facility]  Does patient have financial barriers related to discharge medications?: No, has Medicare.     Summary/Recommendations:   Summary and Recommendations (to be completed by the evaluator): [name] was admitted due to being IVC'd for paranoia and panic attacks. Pt has a hx of schizoaffective disorder. Recent stressors include medical concerns, concerns of water being poisoned . Pt currently sees [facility] as an outpatient providers. While here, [name] can benefit from crisis stabilization, medication management, therapeutic milieu, and referrals for services.

## 2021-09-18 NOTE — ED Notes (Signed)
Remains asleep , no sleep disturbance noted, repirations easy skin color WNL

## 2021-09-18 NOTE — BHH Suicide Risk Assessment (Signed)
Suicide Risk Assessment  Admission Assessment    [facility] Admission Suicide Risk Assessment   Nursing information obtained from:  Patient Demographic factors:  Male Current Mental Status:  NA Loss Factors:  NA Historical Factors:  NA Risk Reduction Factors:  NA  Total Time spent with patient: 45 minutes Principal Problem: Schizoaffective disorder, bipolar type ([place]) Diagnosis:  Principal Problem:   Schizoaffective disorder, bipolar type ([place]) Active Problems:   Anxiety state  Subjective Data: [name] is a 51 yo patient w/ PPH of schizoaffective disorder, bipolar type who presented to [facility] w/ IVC endorsing thoughts that he was being poisoned, HI, poor med compliance, and threatening family members. Patient was at the [facility] 3 days and transferred to [facility] on the 4th day. While at [facility] patient received Zyprexa IM due to his aggression and agitation.Patient reports that he has only been worried about "someone put Orange acid in my [facility]." Patient goes on to endorse that he also believes that someone put [employer] in the Sweet Tea he bought and endorses he he knows this because he "smelt it." Patient denies HI and reports "I'd rather punch the wall than hurt someone else." Patient denies SI and AVH.   Continued Clinical Symptoms:    The "Alcohol Use Disorders Identification Test", Guidelines for Use in Primary Care, Second Edition.  World Pharmacologist [facility]). Score between 0-7:  no or low risk or alcohol related problems. Score between 8-15:  moderate risk of alcohol related problems. Score between 16-19:  high risk of alcohol related problems. Score 20 or above:  warrants further diagnostic evaluation for alcohol dependence and treatment.   CLINICAL FACTORS:   Currently Psychotic   Musculoskeletal: Strength & Muscle Tone: within normal limits Gait & Station: normal Patient leans: Front  Psychiatric Specialty Exam:  Presentation  General Appearance: Casual  Eye  Contact:Fleeting  Speech:Clear and Coherent  Speech Volume:Normal  Handedness:Right   Mood and Affect  Mood:Irritable  Affect:Congruent   Thought Process  Thought Processes:Goal Directed  Descriptions of Associations:Circumstantial  Orientation:Full (Time, Place and Person)  Thought Content:Delusions; Perseveration  History of Schizophrenia/Schizoaffective disorder:Yes  Duration of Psychotic Symptoms:Greater than six months  Hallucinations:Hallucinations: None  Ideas of Reference:Delusions; Paranoia  Suicidal Thoughts:Suicidal Thoughts: No  Homicidal Thoughts:Homicidal Thoughts: No   Sensorium  Memory:Immediate Fair  Judgment:Impaired  Insight:None   Executive Functions  Concentration:Fair  Attention Span:Fair  [place]   Psychomotor Activity  Psychomotor Activity:Psychomotor Activity: Normal   Assets  Assets:Resilience   Sleep  Sleep:Sleep: Fair    Physical Exam: Physical Exam ROS Blood pressure 110/79, pulse 61, temperature (!) 97.5 F (36.4 C), temperature source Oral, resp. rate 18, height 6' (1.829 m), weight 83 kg, SpO2 98 %. Body mass index is 24.82 kg/m.   COGNITIVE FEATURES THAT CONTRIBUTE TO RISK:  Thought constriction (tunnel vision)    SUICIDE RISK:   Severe:  Frequent, intense, and enduring suicidal ideation, specific plan, no subjective intent, but some objective markers of intent (i.e., choice of lethal method), the method is accessible, some limited preparatory behavior, evidence of impaired self-control, severe dysphoria/symptomatology, multiple risk factors present, and few if any protective factors, particularly a lack of social support.  PLAN OF CARE: Admit due to paranoia and delusions. He needs crisis stabilization, safety monitoring and medication management.      I certify that inpatient services furnished can reasonably be expected to improve the patient's  condition.   PGY-2 Freida Busman, MD 09/18/2021,  5:04 PM

## 2021-09-18 NOTE — H&P (Signed)
Psychiatric Admission Assessment Adult  Patient Identification: [name] MRN:  [id number] Date of Evaluation:  09/18/2021 Chief Complaint:  Schizophrenia ([place]) [F20.9] Principal Diagnosis: Schizoaffective disorder, bipolar type ([place]) Diagnosis:  Principal Problem:   Schizoaffective disorder, bipolar type ([place]) Active Problems:   Anxiety state  History of Present Illness: [name] is a 51 yo patient w/ PPH of schizoaffective disorder, bipolar type who presented to [facility] w/ IVC endorsing thoughts that he was being poisoned, HI, poor med compliance, and threatening family members. Patient was at the [facility] 3 days and transferred to [facility] on the 4th day. While at [facility] patient received Zyprexa IM due to his aggression and agitation.  On assessment today patient is Aox4. Patient reports that he was IVC'd by his brother and endorses that he has been IVC'd by family multiple times in the past. Patient reports that he believes that his brother IVC'd him at the request of his mother, and reports "my mom is the one who needs to be here!" Patient reports that he has only been worried about "someone put Orange acid in my [facility]." Patient goes on to endorse that he also believes that someone put [employer] in the Sweet Tea he bought and endorses he he knows this because he "smelt it." Patient reports that he believes he has this polish inside his body and is concerned. Patient reports that he believes his water is poisoned as well. Patient reports that he does not believe he is being specifically targeted, but does endorse that a lot of the liquids around him are poisoned.  Patient reports he is very angry about this. Patient denies HI and reports "I'd rather punch the wall than hurt someone else." Patient denies SI and AVH. Patient reports that he also believes that "they cut my into my brain" but endorses that he is not sure who "they" is.   Patient denies decreased sleep, anhedonia, feelings of guilt/  hopelessness/ worthlessness, change in energy level and distractibility. Patient does endorse that he has been eating less over the past 3 weeks, but also reports it is difficult to eat since his dentures went missing.   Patient denies anxiety. Patient was to preoccupied with talking about poisons to accurately assess PTSD or Bipolar hx.   Associated Signs/Symptoms: Depression Symptoms:  decreased appetite, Duration of Depression Symptoms: No data recorded (Hypo) Manic Symptoms:  Delusions, Irritable Mood, Anxiety Symptoms:   denies Psychotic Symptoms:  Delusions, Paranoia, PTSD Symptoms: NA Total Time spent with patient: 30 minutes  Past Psychiatric History: Multiple prior psychitatirc hospitalization, most recent was 01/2020 here at [facility].   Prior Medications include: Invega sustena (patietn endorse feeling over sedated), Zyprexa (dual w/ Invega), Seroquel  Is the patient at risk to self? No.  Has the patient been a risk to self in the past 6 months? No.  Has the patient been a risk to self within the distant past? Yes.    Is the patient a risk to others? Yes.    Has the patient been a risk to others in the past 6 months? Yes.    Has the patient been a risk to others within the distant past? Yes.     Prior Inpatient Therapy:   Prior Outpatient Therapy:    Alcohol Screening: Patient refused Alcohol Screening Tool: Yes 1. How often do you have a drink containing alcohol?: Never 2. How many drinks containing alcohol do you have on a typical day when you are drinking?: 1 or 2  3. How often do you have six or more drinks on one occasion?: Never AUDIT-C Score: 0 Substance Abuse History in the last 12 months:  No. Consequences of Substance Abuse: NA Previous Psychotropic Medications: No  Psychological Evaluations: No  Past Medical History:  Past Medical History:  Diagnosis Date   Asthma    CHF (congestive heart failure) (HCC)    Chronic back pain    COPD (chronic obstructive  pulmonary disease) (HCC)    Knee pain, chronic    Migraines    Schizophrenia, schizo-affective ([place])     Past Surgical History:  Procedure Laterality Date   KNEE SURGERY     four times   WISDOM TOOTH EXTRACTION     Family History:  Family History  Problem Relation Age of Onset   Hypertension Father    Family Psychiatric  History: Sister: schizophrenia Tobacco Screening:   Social History:  Social History   Substance and Sexual Activity  Alcohol Use No   Comment: occ     Social History   Substance and Sexual Activity  Drug Use No   Comment: Pt denied    Additional Social History:                           Allergies:   Allergies  Allergen Reactions   Amoxicillin Diarrhea    Has patient had a PCN reaction causing immediate rash, facial/tongue/throat swelling, SOB or lightheadedness with hypotension: No Has patient had a PCN reaction causing severe rash involving mucus membranes or skin necrosis: No Has patient had a PCN reaction that required hospitalization: No Has patient had a PCN reaction occurring within the last 10 years: No If all of the above answers are "NO", then may proceed with Cephalosporin use.  Pt did not elaborate   Dextromethorphan-Guaifenesin Other (See Comments)    unknown   Haloperidol Other (See Comments)    unspecified   Haloperidol Lactate    Meloxicam Other (See Comments)    unspecified   Nsaids Other (See Comments)    Rectal bleeding   Quetiapine Other (See Comments)    Psychosis with high dose (600mg )   Sudafed [Pseudoephedrine Hcl] Other (See Comments)    Hallucinations   Ziprasidone Other (See Comments)    unspecified   Prednisone Other (See Comments) and Anxiety    Psychotic episodes  Patient states it makes me psychotic   Lab Results: No results found for this or any previous visit (from the past 48 hour(s)).  Blood Alcohol level:  Lab Results  Component Value Date   ETH <10 04/27/2021   ETH <10 54/27/0623     Metabolic Disorder Labs:  Lab Results  Component Value Date   HGBA1C 5.5 09/14/2021   MPG 111.15 09/14/2021   MPG 114.02 01/17/2020   Lab Results  Component Value Date   PROLACTIN 20.8 (H) 09/14/2021   PROLACTIN 16.1 (H) 01/17/2020   Lab Results  Component Value Date   CHOL 185 09/14/2021   TRIG 137 09/14/2021   HDL 44 09/14/2021   CHOLHDL 4.2 09/14/2021   VLDL 27 09/14/2021   LDLCALC 114 (H) 09/14/2021   LDLCALC 84 01/17/2020    Current Medications: Current Facility-Administered Medications  Medication Dose Route Frequency Provider Last Rate Last Admin   acetaminophen (TYLENOL) tablet 650 mg  650 mg Oral Q6H PRN Rozetta Nunnery, NP       alum & mag hydroxide-simeth (MAALOX/MYLANTA) 200-200-20 MG/5ML suspension 30 mL  30  mL Oral Q4H PRN Rozetta Nunnery, NP       chlorproMAZINE (THORAZINE) tablet 100 mg  100 mg Oral Q4H PRN Hill, Jackie Plum, MD       Or   chlorproMAZINE (THORAZINE) injection 50 mg  50 mg Intramuscular Q4H PRN Hill, Jackie Plum, MD       chlorproMAZINE (THORAZINE) tablet 100 mg  100 mg Oral TID Maida Sale, MD       diazepam (VALIUM) tablet 10 mg  10 mg Oral QHS PRN Hill, Jackie Plum, MD       Or   diazepam (VALIUM) injection 10 mg  10 mg Intramuscular QHS PRN Hill, Jackie Plum, MD       hydrOXYzine (ATARAX) tablet 25 mg  25 mg Oral TID PRN Lindon Romp A, NP       lidocaine (LIDODERM) 5 % 1 patch  1 patch Transdermal Q24H Hill, Jackie Plum, MD       magnesium hydroxide (MILK OF MAGNESIA) suspension 30 mL  30 mL Oral Daily PRN Rozetta Nunnery, NP       PTA Medications: Medications Prior to Admission  Medication Sig Dispense Refill Last Dose   albuterol (VENTOLIN HFA) 108 (90 Base) MCG/ACT inhaler Inhale 2 puffs into the lungs every 4 (four) hours as needed for wheezing or shortness of breath. (Patient taking differently: Inhale 2 puffs into the lungs every 4 (four) hours as needed for wheezing or shortness of breath. LF  07/06/21) 18 g 1    ALPRAZolam (XANAX) 1 MG tablet Take 1 mg by mouth 3 (three) times daily as needed for anxiety. LF 08/14/21      INVEGA SUSTENNA 156 MG/ML SUSY injection Inject 156 mg into the muscle every 28 (twenty-eight) days. LF 08/21/21       Musculoskeletal: Strength & Muscle Tone: within normal limits Gait & Station: normal Patient leans: N/A            Psychiatric Specialty Exam:  Presentation  General Appearance: Casual  Eye Contact:Fleeting  Speech:Clear and Coherent  Speech Volume:Normal  Handedness:Right   Mood and Affect  Mood:Irritable  Affect:Congruent   Thought Process  Thought Processes:Goal Directed  Duration of Psychotic Symptoms: Greater than six months  Past Diagnosis of Schizophrenia or Psychoactive disorder: Yes  Descriptions of Associations:Circumstantial  Orientation:Full (Time, Place and Person)  Thought Content:Delusions; Perseveration  Hallucinations:Hallucinations: None  Ideas of Reference:Delusions; Paranoia  Suicidal Thoughts:Suicidal Thoughts: No  Homicidal Thoughts:Homicidal Thoughts: No   Sensorium  Memory:Immediate Fair  Judgment:Impaired  Insight:None   Executive Functions  Concentration:Fair  Attention Span:Fair  [place]   Psychomotor Activity  Psychomotor Activity:Psychomotor Activity: Normal   Assets  Assets:Resilience   Sleep  Sleep:Sleep: Fair    Physical Exam: Physical Exam HENT:     Head: Normocephalic and atraumatic.  Pulmonary:     Effort: Pulmonary effort is normal.  Neurological:     Mental Status: He is alert and oriented to person, place, and time.   Review of Systems  Psychiatric/Behavioral:  Negative for hallucinations and suicidal ideas.   Blood pressure 110/79, pulse 61, temperature (!) 97.5 F (36.4 C), temperature source Oral, resp. rate 18, height 6' (1.829 m), weight 83 kg, SpO2 98 %. Body mass index is 24.82  kg/m.  Treatment Plan Summary: Daily contact with patient to assess and evaluate symptoms and progress in treatment and Medication management Labs Reviewed: CBC- WNL, CMP- Na 134, Prolactin- 20.18, A1c- WNL, Lipid panel- LDL  114, TSH- WNL, UDS (-)  Pending: Mg, RPR, B12, Vit D EKG pending  [name] is a 51 yo patient w/ PPH of schizoaffective disorder, bipolar type who is well known to [facility]. Patient appears paranoid, delusional and irritable and per IVC patient has had poor compliance with medications. In the past patient has required dual second generation antipsychotic therapy for stabilization. Will try patient on a First generation antipsychotic to attempt stabilization on monotherapy.   Schizoaffective disorder, bipolar type - Start Thorazine 100mg  TID - Disocntinue Zyprexa 10mg  QHS - Continue IVC - Agitation protocol: Thorazine 100mg  q4h PRN PO or 50mg  q4h PRN IM  PRN -Tylenol 650mg  q6h, pain -Maalox 76ml q4h, indigestion -Atarax 25mg  TID, anxiety -Milk of Mag 87mL, constipation -Trazodone 50mg  QHS, insomnia  - If not asleep by 11 pm Valium 10mg  QHS PRN PO or 10mg  IM PRN  Safety: Q 15 min checks, routine Physician Treatment Plan for Primary Diagnosis: Schizoaffective disorder, bipolar type ([place]) Long Term Goal(s): Improvement in symptoms so as ready for discharge  Short Term Goals: Ability to identify changes in lifestyle to reduce recurrence of condition will improve, Ability to verbalize feelings will improve, Ability to disclose and discuss suicidal ideas, Ability to demonstrate self-control will improve, Ability to identify and develop effective coping behaviors will improve, Ability to maintain clinical measurements within normal limits will improve, and Compliance with prescribed medications will improve  Physician Treatment Plan for Secondary Diagnosis: Principal Problem:   Schizoaffective disorder, bipolar type ([place]) Active Problems:   Anxiety state  Long Term  Goal(s): Improvement in symptoms so as ready for discharge  Short Term Goals: Ability to identify changes in lifestyle to reduce recurrence of condition will improve, Ability to verbalize feelings will improve, Ability to disclose and discuss suicidal ideas, Ability to demonstrate self-control will improve, Ability to identify and develop effective coping behaviors will improve, and Ability to maintain clinical measurements within normal limits will improve  I certify that inpatient services furnished can reasonably be expected to improve the patient's condition.     PGY-2 Freida Busman, MD 1/11/20234:43 PM

## 2021-09-18 NOTE — Progress Notes (Signed)
Pt offered Valium, pt refused.

## 2021-09-18 NOTE — Progress Notes (Signed)
Pt visible on the unit some this evening, pt refused his PO medications pt continues to be stand offish with Probation officer due to history of past admissions    09/18/21 2000  Psych Admission Type (Psych Patients Only)  Admission Status Involuntary  Psychosocial Assessment  Patient Complaints Irritability;Suspiciousness  Eye Contact Intense  Facial Expression Flat  Affect Irritable;Labile  Speech Rapid;Argumentative  Interaction Arrogant;Defensive;Demanding  Motor Activity Posturing;Hyperactive;Pacing  Appearance/Hygiene Unremarkable  Behavior Characteristics Guarded;Resistant to care  Mood Labile  Aggressive Behavior  Targets Self;Property  Type of Behavior Rage;Verbal  Effect No apparent injury  Thought Horticulturist, commercial of ideas;Tangential;Disorganized  Content Preoccupation;Blaming others;Paranoia  Delusions Paranoid;Grandeur;Persecutory  Perception Derealization  Hallucination None reported or observed  Judgment Poor  Confusion Mild  Danger to Self  Current suicidal ideation? Denies  Danger to Others  Danger to Others None reported or observed

## 2021-09-18 NOTE — ED Notes (Signed)
Report called to [facility] nurse.  [employer] called to assist in transporting pt to [facility].  Will continue to monitor for safety.

## 2021-09-18 NOTE — Tx Team (Signed)
Initial Treatment Plan 09/18/2021 11:54 AM [name] Kervin NXG:[id number]    PATIENT STRESSORS: Health problems   Medication change or noncompliance     PATIENT STRENGTHS: Capable of independent living  Supportive family/friends    PATIENT IDENTIFIED PROBLEMS: Paranoia  Depression  Medication noncompliance  Ineffective coping skills               DISCHARGE CRITERIA:  Ability to meet basic life and health needs Safe-care adequate arrangements made  PRELIMINARY DISCHARGE PLAN: Attend aftercare/continuing care group Outpatient therapy Return to previous living arrangement  PATIENT/FAMILY INVOLVEMENT: This treatment plan has been presented to and reviewed with the patient, [name], and/or family member.  The patient and family have been given the opportunity to ask questions and make suggestions.  Vela Prose, RN 09/18/2021, 11:54 AM

## 2021-09-18 NOTE — Group Note (Signed)
Type of Therapy/Topic: Identifying Irrational Beliefs/Thoughts  Participation Level: Active/Monopolizing  Description of Group: The purpose of this group is to assist patients in learning to identify irrational beliefs and thoughts that contribute to their negative emotions and experience positive emotions. Patients will be guided to discuss ways in which they have been effected by irrational thoughts and beliefs and how to transform those irrational beliefs into rational ones. Newly identified rational beliefs will be juxtaposed with experiences of positive emotions or situations, and patients will be challenged to use rational beliefs or thoughts to combat negative ones. Special emphasis will be placed on coping with irrational beliefs in conflict situations, and patients will process healthy conflict resolution skills.  Therapeutic Goals: 1. Patient will identify two irrational thoughts or beliefs  to reflect on in order to balance out those thoughts 2. Patient will label two or more irrational thoughts/beliefs that they find the most difficult to cope with 3. Patient will demonstrate positive conflict resolution skills through discussion and/or role plays that will assist in transforming irrational thoughts or beliefs into positive ones.  Summary of Patient Progress: Pt was talking nonstop, was off topic, and would not allow others to speak.   Therapeutic Modalities: Cognitive Behavioral Therapy Feelings Identification Dialectical Behavioral Therapy

## 2021-09-19 ENCOUNTER — Ambulatory Visit (HOSPITAL_COMMUNITY)
Admission: RE | Admit: 2021-09-19 | Discharge: 2021-09-19 | Disposition: A | Discharge status: Home / Self Care | Payer: Medicare Other | Source: Ambulatory Visit | Attending: Psychiatry | Admitting: Psychiatry

## 2021-09-19 DIAGNOSIS — M546 Pain in thoracic spine: Secondary | ICD-10-CM | POA: Insufficient documentation

## 2021-09-19 DIAGNOSIS — G8929 Other chronic pain: Secondary | ICD-10-CM | POA: Insufficient documentation

## 2021-09-19 MED ORDER — CHLORPROMAZINE HCL 50 MG PO TABS
50.0000 mg | ORAL_TABLET | Freq: Every morning | ORAL | Status: DC
  Administered 2021-09-20 – 2021-09-23 (×4): 50 mg via ORAL
  Filled 2021-09-19 (×6): qty 1

## 2021-09-19 MED ORDER — CHLORPROMAZINE HCL 25 MG/ML IJ SOLN
25.0000 mg | Freq: Every morning | INTRAMUSCULAR | Status: DC
  Filled 2021-09-19 (×6): qty 1

## 2021-09-19 MED ORDER — CHLORPROMAZINE HCL 100 MG PO TABS
100.0000 mg | ORAL_TABLET | Freq: Every day | ORAL | Status: DC
  Administered 2021-09-19 – 2021-09-22 (×3): 100 mg via ORAL
  Filled 2021-09-19 (×6): qty 1

## 2021-09-19 MED ORDER — CHLORPROMAZINE HCL 25 MG/ML IJ SOLN
50.0000 mg | Freq: Every day | INTRAMUSCULAR | Status: DC
  Administered 2021-09-20: 50 mg via INTRAMUSCULAR
  Filled 2021-09-19 (×6): qty 2

## 2021-09-19 NOTE — Progress Notes (Signed)
°   09/19/21 0530  Sleep  Number of Hours 2.25

## 2021-09-19 NOTE — Progress Notes (Signed)
Recreation Therapy Notes  INPATIENT RECREATION THERAPY ASSESSMENT  Patient Details Name: [name] MRN: [id number] DOB: 02-06-1971 Today's Date: 09/19/2021       Information Obtained From: Patient  Able to Participate in Assessment/Interview: Yes  Patient Presentation: Alert ([place])  Reason for Admission (Per Patient): Other (Comments) (Pt stated bad teeth and bad water.)  Patient Stressors: Other (Comment) (Pt stated someone got into his water and put something it.  He didn't realize it and drank the water.)  Coping Skills:   Isolation, TV, Sports, Music, Meditate, Talk, Prayer, Avoidance  Leisure Interests (2+):  Music - Listen  Frequency of Recreation/Participation: Other (Comment) (Daily)  Awareness of Community Resources:     [employer]:     Current Use:    If no, Barriers?:    Expressed Interest in [place]: No  [place] of Residence:  [place]  Patient Main Form of Transportation: Musician  Patient Strengths:  "I don't know"  Patient Identified Areas of Improvement:  Communication  Patient Goal for Hospitalization:  "to be friendly and use self control"  Current SI (including self-harm):  No  Current HI:  No  Current AVH: No  Staff Intervention Plan: Group Attendance, Collaborate with Interdisciplinary Treatment Team  Consent to Intern Participation: N/A   Victorino Sparrow, Vickki Muff, Annsley Akkerman A 09/19/2021, 12:05 PM

## 2021-09-19 NOTE — Progress Notes (Signed)
The patient verbalized that he had a good day overall. Patient states that he had a bad conversation with his doctor today since he was told that he needs to take medicine. Patient has talked about his history of going back to school.

## 2021-09-19 NOTE — Progress Notes (Signed)
[facility] Second Physician Opinion Progress Note for Medication Administration to Non-consenting Patients (For Involuntarily Committed Patients)  Patient: [name] Date of Birth: [phone] MRN: [id number]   Reason for the Medication: The patient, without the benefit of the specific treatment measure, is incapable of participating in any available treatment plan that will give the patient a realistic opportunity of improving the patient's condition.   Consideration of Side Effects: Consideration of the side effects related to the medication plan has been given.   Rationale for Medication Administration: Patient has shown improvement in past hospitalizations with medication management.  He decompensates after discharge when he becomes medication compliant.   At the present time, patient has no capacity to understand the need for treatment, refusing to take psychotropic medication, because of poor insight and judgment.  Patient states that they have no problem, no mental illness and refuses to consider taking medication, recommended by the psychiatrist Dr. Cathey Endow.  Diagnosis: Schizoaffective disorder, bipolar type   Treatment plan/Opinion: -patient is mentally ill, as needs hospitalization -patient continues to display active symptoms of psychosis -patient is unable to rationally discuss medication options, risks versus benefit due to their symptoms. -treatment with medication and medication changes would be in their best interest -psychotropics are effective treatment for symptoms of psychosis   Based on my evaluation, the following medications are recommend and indicated for treatment of the patient's psychiatric diagnosis and symptoms: Antipsychotic medications (first or second generation antipsychotic)  Mood stabilizing medications     Christoper Allegra, MD Psychiatrist  09/19/21  4:43 PM   This documentation is good for (7) seven days from the date of the MD signature. New  documentation must be completed every seven (7) days with detailed justification in the medical record if the patient requires continued non-emergent administration of psychotropic medications.

## 2021-09-19 NOTE — Progress Notes (Signed)
Pt agreed to take Thorazine PO tonight without incident. Pt stated he prefers Zyprexa, Probation officer encouraged pt to talk to the doctor    09/19/21 2000  Psych Admission Type (Psych Patients Only)  Admission Status Involuntary  Psychosocial Assessment  Patient Complaints Suspiciousness;Worrying  Eye Contact Intense  Facial Expression Flat  Affect Irritable;Labile  Speech Rapid;Argumentative  Interaction Arrogant;Defensive;Demanding  Motor Activity Posturing;Hyperactive;Pacing  Appearance/Hygiene Unremarkable  Behavior Characteristics Restless  Mood Suspicious  Aggressive Behavior  Targets Self;Property  Type of Behavior Rage;Verbal  Effect No apparent injury  Thought Horticulturist, commercial of ideas;Tangential;Disorganized  Content Preoccupation;Blaming others;Paranoia  Delusions Paranoid;Grandeur;Persecutory  Perception Derealization  Hallucination None reported or observed  Judgment Poor  Confusion Mild  Danger to Self  Current suicidal ideation? Denies  Danger to Others  Danger to Others None reported or observed
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
## 2021-09-19 NOTE — Group Note (Signed)
Recreation Therapy Group Note   Group Topic:Coping Skills  Group Date: 09/19/2021 Start Time: 1694 End Time: 1030 Facilitators: Victorino Sparrow, LRT,CTRS Location: [address] Dayroom   Goal Area(s) Addresses: Patient will define what a coping skill is. Patient will successfully identify positive coping skills they can use post d/c.  Patient will acknowledge benefit(s) of using learned coping skills post d/c.   Group Description: Coping A to Z. Patient asked to identify what a coping skill is and when they use them. Patients with Probation officer discussed healthy versus unhealthy coping skills. Next patients were given a blank worksheet titled "Coping Skills A-Z". Patients were instructed to come up with at least one positive coping skill per letter of the alphabet. Patients were given 15 minutes to complete task, before ideas were presented to the large group. Patients and LRT debriefed on the importance of coping skill selection based on situation and back-up plans when a skill tried is not effective.    Affect/Mood: Appropriate   Participation Level: Engaged   Participation Quality: Independent   Behavior: Appropriate   Speech/Thought Process: Barely audible  and Relevant   Insight: Good   Judgement: Good   Modes of Intervention: Worksheet   Patient Response to Interventions:  Engaged   Education Outcome:  Acknowledges education and In group clarification offered    Clinical Observations/Individualized Feedback: Pt was appropriate but hard to understand.  Pt was able to stay on topic during group session.  Pt identified six of the coping skills from his list as assertiveness, intelligence, biology, umbrella, strength and resources.  Pt expressed the coping skills used the most from the list are intelligence, jokes, kindness and being quiet. The coping skills stated he would use more are delay and philanthropy.  Pt explained delay as going to the hospital.   Plan: Continue to engage  patient in RT group sessions 2-3x/week.   Victorino Sparrow, LRT,CTRS 09/19/2021 11:51 AM

## 2021-09-19 NOTE — Progress Notes (Signed)
°   09/19/21 1400  Psych Admission Type (Psych Patients Only)  Admission Status Involuntary  Psychosocial Assessment  Patient Complaints Suspiciousness  Eye Contact Intense  Facial Expression Flat  Affect Irritable;Labile  Speech Rapid;Argumentative  Interaction Arrogant;Defensive;Demanding  Motor Activity Posturing;Hyperactive;Pacing  Appearance/Hygiene Unremarkable  Behavior Characteristics Resistant to care  Mood Suspicious  Aggressive Behavior  Targets Self;Property  Type of Behavior Rage;Verbal  Effect No apparent injury  Thought Horticulturist, commercial of ideas;Tangential;Disorganized  Content Preoccupation;Blaming others;Paranoia  Delusions Paranoid;[place]ande[place];Persecutory  Perception Derealization  Hallucination None reported or observed  Judgment Poor  Confusion Mild  Danger to Self  Current suicidal ideation? Denies  Danger to Others  Danger to Others None reported or observed

## 2021-09-19 NOTE — BHH Group Notes (Signed)
Adult Psychoeducational Group Note  Date:  09/19/2021 Time:  9:24 AM  Group Topic/Focus:  Goals Group:   The focus of this group is to help patients establish daily goals to achieve during treatment and discuss how the patient can incorporate goal setting into their daily lives to aide in recovery.  Participation Level:  Did Not Attend    [name] 09/19/2021, 9:24 AM

## 2021-09-20 ENCOUNTER — Encounter (HOSPITAL_COMMUNITY): Payer: Self-pay

## 2021-09-20 MED ORDER — LORAZEPAM 2 MG/ML IJ SOLN: 2.0000 mg | Freq: Once | INTRAMUSCULAR | Status: DC

## 2021-09-20 NOTE — Group Note (Signed)
LCSW Group Therapy Note   Group Date: 09/20/2021 Start Time: 1100 End Time: 1200  Type of Therapy and Topic:  Group Therapy:  Stress Management   Participation Level:  Active    Description of Group:  Patients in this group were introduced to the idea of stress and encouraged to discuss negative and positive ways to manage stress. Patients discussed specific stressors that they have in their life right now and the physical signs and symptoms associated with that stress.  Patient encouraged to come up with positive changes to assist with the stress upon discharge in order to prevent future hospitalizations.   They also worked as a group on developing a specific plan for several patients to deal with stressors through [place], psychoeducation and self care techniques   Therapeutic Goals:               1)  To discuss the positive and negative impacts of stress             2)  identify signs and symptoms of stress             3)  generate ideas for stress management             4)  offer mutual support to others regarding stress management             5)  Developing plans for ways to manage specific stressors upon discharge               Summary of Patient Progress:  Pt attended. Pt was intrusive and stated he had no stress.    Therapeutic Modalities:   Motivational Interviewing Brief Solution-Focused Therapy

## 2021-09-20 NOTE — BHH Suicide Risk Assessment (Signed)
[place] INPATIENT:  Family/Significant Other Suicide Prevention Education  Suicide Prevention Education:  Contact Attempts: mother Judd Lien [phone]), has been identified by the patient as the family member/significant other with whom the patient will be residing, and identified as the person(s) who will aid the patient in the event of a mental health crisis.  With written consent from the patient, two attempts were made to provide suicide prevention education, prior to and/or following the patient's discharge.  We were unsuccessful in providing suicide prevention education.  A suicide education pamphlet was given to the patient to share with family/significant other.  Date and time of first attempt:09/20/2021 / 10:48am CSW attempted to reach this patients mother. CSW was unable to leave voicemail due to voicemail box being full.  Date and time of second attempt: Second attempt still needs to be made.   Jens Siems A Franki Alcaide 09/20/2021, 10:49 AM
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
## 2021-09-20 NOTE — Progress Notes (Signed)
Pt refused the pills, but took the IM HS medication

## 2021-09-20 NOTE — Progress Notes (Signed)
°   09/20/21 2000  Psych Admission Type (Psych Patients Only)  Admission Status Involuntary  Psychosocial Assessment  Patient Complaints Worrying;Suspiciousness  Eye Contact Intense  Facial Expression Flat  Affect Irritable;Labile  Speech Rapid;Argumentative  Interaction Arrogant;Defensive;Demanding  Motor Activity Posturing;Hyperactive;Pacing  Appearance/Hygiene Unremarkable  Behavior Characteristics Cooperative;Anxious  Mood Suspicious;Anxious;Preoccupied  Aggressive Behavior  Targets Self;Property  Type of Behavior Rage;Verbal  Effect No apparent injury  Thought Horticulturist, commercial of ideas;Tangential;Disorganized  Content Preoccupation;Blaming others;Paranoia  Delusions Paranoid;Grandeur;Persecutory  Perception Derealization  Hallucination None reported or observed  Judgment Poor  Confusion Mild  Danger to Self  Current suicidal ideation? Denies  Danger to Others  Danger to Others None reported or observed

## 2021-09-20 NOTE — Progress Notes (Signed)
°   09/20/21 1100  Psych Admission Type (Psych Patients Only)  Admission Status Involuntary  Psychosocial Assessment  Patient Complaints Worrying;Suspiciousness  Eye Contact Intense  Facial Expression Flat  Affect Irritable;Labile  Speech Rapid;Argumentative  Interaction Arrogant;Defensive;Demanding  Motor Activity Posturing;Hyperactive;Pacing  Appearance/Hygiene Unremarkable  Behavior Characteristics Appropriate to situation  Mood Suspicious  Aggressive Behavior  Targets Self;Property  Type of Behavior Rage;Verbal  Effect No apparent injury  Thought Horticulturist, commercial of ideas;Tangential;Disorganized  Content Preoccupation;Blaming others;Paranoia  Delusions Paranoid;Grandeur;Persecutory  Perception Derealization  Hallucination None reported or observed  Judgment Poor  Confusion Mild  Danger to Self  Current suicidal ideation? Denies  Danger to Others  Danger to Others None reported or observed

## 2021-09-20 NOTE — Group Note (Signed)
Recreation Therapy Group Note   Group Topic:Team Building  Group Date: 09/20/2021 Start Time: 1000 End Time: 1020 Facilitators: Victorino Sparrow, LRT,CTRS Location: [address] Dayroom   Goal Area(s) Addresses:  Patient will effectively work with peer towards shared goal.  Patient will identify skills used to make activity successful.  Patient will identify how skills used during activity can be used to reach post d/c goals.    Group Description:  Landing Pad. In teams of 3-5, patients were given 12 plastic drinking straws and an equal length of masking tape. Using the materials provided, patients were asked to build a landing pad to catch a golf ball dropped from approximately 5 feet in the air. All materials were required to be used by the team in their design. LRT facilitated post-activity discussion.   Affect/Mood: Appropriate   Participation Level: Engaged   Participation Quality: Independent   Behavior: Appropriate   Speech/Thought Process: Barely audible    Insight: Moderate   Judgement: Moderate   Modes of Intervention: STEM Activity   Patient Response to Interventions:  Engaged   Education Outcome:  Acknowledges education and In group clarification offered    Clinical Observations/Individualized Feedback: Pt was upbeat and attentive during group.  Pt worked well with peer to complete the task.  Pt was mumbling to himself throughout group but would stop long enough to answer a question when prompted.  Pt was hard to understand.  Pt did express having to work with support system to come up with a solution to your problem.     Plan: Continue to engage patient in RT group sessions 2-3x/week.   Victorino Sparrow, LRT,CTRS 09/20/2021 12:03 PM

## 2021-09-20 NOTE — BHH Group Notes (Signed)
Spirituality group facilitated by Kathrynn Humble, [place].   Group Description: Group focused on topic of hope. Patients participated in facilitated discussion around topic, connecting with one another around experiences and definitions for hope. Group members engaged with visual explorer photos, reflecting on what hope looks like for them today. Group engaged in discussion around how their definitions of hope are present today in hospital.   Modalities: Psycho-social ed, Adlerian, Narrative, MI   Patient Progress: [place] attended group and was engaged.  Comments were tangential and at times not appropriate for the group setting.  Chaplain Janne Napoleon, [place] Pager, [phone] 2:03 PM

## 2021-09-20 NOTE — BH IP Treatment Plan (Signed)
Interdisciplinary Treatment and Diagnostic Plan Update  09/20/2021 [name] MRN: [id number]  Principal Diagnosis: Schizoaffective disorder, bipolar type ([place])  Secondary Diagnoses: Principal Problem:   Schizoaffective disorder, bipolar type ([place]) Active Problems:   Anxiety state   Current Medications:  Current Facility-Administered Medications  Medication Dose Route Frequency Provider Last Rate Last Admin   acetaminophen (TYLENOL) tablet 650 mg  650 mg Oral Q6H PRN Rozetta Nunnery, NP   650 mg at 09/20/21 0612   alum & mag hydroxide-simeth (MAALOX/MYLANTA) 200-200-20 MG/5ML suspension 30 mL  30 mL Oral Q4H PRN Rozetta Nunnery, NP   30 mL at 09/20/21 0240   chlorproMAZINE (THORAZINE) tablet 50 mg  50 mg Oral q AM Maida Sale, MD   50 mg at 09/20/21 9735   Or   chlorproMAZINE (THORAZINE) injection 25 mg  25 mg Intramuscular q AM Hill, Jackie Plum, MD       chlorproMAZINE (THORAZINE) tablet 100 mg  100 mg Oral Q4H PRN Hill, Jackie Plum, MD       Or   chlorproMAZINE (THORAZINE) injection 50 mg  50 mg Intramuscular Q4H PRN Hill, Jackie Plum, MD       chlorproMAZINE (THORAZINE) tablet 100 mg  100 mg Oral QHS Hill, Jackie Plum, MD   100 mg at 09/19/21 1958   Or   chlorproMAZINE (THORAZINE) injection 50 mg  50 mg Intramuscular QHS Hill, Jackie Plum, MD       diazepam (VALIUM) tablet 10 mg  10 mg Oral QHS PRN Hill, Jackie Plum, MD       Or   diazepam (VALIUM) injection 10 mg  10 mg Intramuscular QHS PRN Hill, Jackie Plum, MD       hydrOXYzine (ATARAX) tablet 25 mg  25 mg Oral TID PRN Lindon Romp A, NP       lidocaine (LIDODERM) 5 % 1 patch  1 patch Transdermal Q24H Hill, Jackie Plum, MD       magnesium hydroxide (MILK OF MAGNESIA) suspension 30 mL  30 mL Oral Daily PRN Rozetta Nunnery, NP       PTA Medications: Medications Prior to Admission  Medication Sig Dispense Refill Last Dose   albuterol (VENTOLIN HFA) 108 (90 Base) MCG/ACT inhaler Inhale 2  puffs into the lungs every 4 (four) hours as needed for wheezing or shortness of breath. (Patient taking differently: Inhale 2 puffs into the lungs every 4 (four) hours as needed for wheezing or shortness of breath. LF 07/06/21) 18 g 1    ALPRAZolam (XANAX) 1 MG tablet Take 1 mg by mouth 3 (three) times daily as needed for anxiety. LF 08/14/21      INVEGA SUSTENNA 156 MG/ML SUSY injection Inject 156 mg into the muscle every 28 (twenty-eight) days. LF 08/21/21       Patient Stressors: Health problems   Medication change or noncompliance    Patient Strengths: Capable of independent living  Supportive family/friends   Treatment Modalities: Medication Management, Group therapy, Case management,  1 to 1 session with clinician, Psychoeducation, Recreational therapy.   Physician Treatment Plan for Primary Diagnosis: Schizoaffective disorder, bipolar type ([place]) Long Term Goal(s): Improvement in symptoms so as ready for discharge   Short Term Goals: Ability to identify changes in lifestyle to reduce recurrence of condition will improve Ability to verbalize feelings will improve Ability to disclose and discuss suicidal ideas Ability to demonstrate self-control will improve Ability to identify and develop effective coping behaviors will improve Ability to maintain clinical measurements  within normal limits will improve Compliance with prescribed medications will improve  Medication Management: Evaluate patient's response, side effects, and tolerance of medication regimen.  Therapeutic Interventions: 1 to 1 sessions, Unit Group sessions and Medication administration.  Evaluation of Outcomes: Progressing  Physician Treatment Plan for Secondary Diagnosis: Principal Problem:   Schizoaffective disorder, bipolar type ([place]) Active Problems:   Anxiety state  Long Term Goal(s): Improvement in symptoms so as ready for discharge   Short Term Goals: Ability to identify changes in lifestyle to reduce  recurrence of condition will improve Ability to verbalize feelings will improve Ability to disclose and discuss suicidal ideas Ability to demonstrate self-control will improve Ability to identify and develop effective coping behaviors will improve Ability to maintain clinical measurements within normal limits will improve Compliance with prescribed medications will improve     Medication Management: Evaluate patient's response, side effects, and tolerance of medication regimen.  Therapeutic Interventions: 1 to 1 sessions, Unit Group sessions and Medication administration.  Evaluation of Outcomes: Progressing   RN Treatment Plan for Primary Diagnosis: Schizoaffective disorder, bipolar type ([place]) Long Term Goal(s): Knowledge of disease and therapeutic regimen to maintain health will improve  Short Term Goals: Ability to participate in decision making will improve, Ability to verbalize feelings will improve, and Ability to identify and develop effective coping behaviors will improve  Medication Management: RN will administer medications as ordered by provider, will assess and evaluate patient's response and provide education to patient for prescribed medication. RN will report any adverse and/or side effects to prescribing provider.  Therapeutic Interventions: 1 on 1 counseling sessions, Psychoeducation, Medication administration, Evaluate responses to treatment, Monitor vital signs and CBGs as ordered, Perform/monitor CIWA, COWS, AIMS and Fall Risk screenings as ordered, Perform wound care treatments as ordered.  Evaluation of Outcomes: Progressing   LCSW Treatment Plan for Primary Diagnosis: Schizoaffective disorder, bipolar type ([place]) Long Term Goal(s): Safe transition to appropriate next level of care at discharge, Engage patient in therapeutic group addressing interpersonal concerns.  Short Term Goals: Engage patient in aftercare planning with referrals and resources, Increase social  support, and Increase ability to appropriately verbalize feelings  Therapeutic Interventions: Assess for all discharge needs, 1 to 1 time with Social worker, Explore available resources and support systems, Assess for adequacy in community support network, Educate family and significant other(s) on suicide prevention, Complete Psychosocial Assessment, Interpersonal group therapy.  Evaluation of Outcomes: Progressing   Progress in Treatment: Attending groups: Yes. Participating in groups: Yes. Taking medication as prescribed: Yes. Toleration medication: Yes. Family/Significant other contact made: Yes, individual(s) contacted:  mother Patient understands diagnosis: No. Discussing patient identified problems/goals with staff: Yes. Medical problems stabilized or resolved: Yes. Denies suicidal/homicidal ideation: Yes. Issues/concerns per patient self-inventory: No. Other: None  New problem(s) identified: No, Describe:  None  New Short Term/Long Term Goal(s): medication stabilization, elimination of SI thoughts, development of comprehensive mental wellness plan.   Patient Goals: Did Not Attend   Discharge Plan or Barriers: Patient recently admitted. CSW will continue to follow and assess for appropriate referrals and possible discharge planning.   Reason for Continuation of Hospitalization: Aggression Delusions  Hallucinations Medication stabilization  Estimated Length of Stay: 3-5 days   Scribe for Treatment Team: Eliott Nine 09/20/2021 2:21 PM

## 2021-09-20 NOTE — Progress Notes (Signed)
°   09/20/21 0545  Sleep  Number of Hours 8

## 2021-09-21 NOTE — Progress Notes (Signed)
[facility] MD Progress Note  09/21/2021 1:55 PM [name]  MRN:  [id number] Subjective:  [name] is a 51 yo patient w/ PPH of schizoaffective disorder, bipolar type who presented to [facility] w/ IVC endorsing thoughts that he was being poisoned, HI, poor med compliance, and threatening family members. Patient was at the [facility] 3 days and transferred to [facility] on the 4th day. While at [facility] patient received Zyprexa IM due to his aggression and agitation.   Case was discussed in the multidisciplinary team. MAR was reviewed and patient was NOT compliant with medications.  He did not require any PRN's for agitation.     Psychiatric Team made the following recommendations yesterday:  - Continue Thorazine 100mg  TID - Continue IVC - Agitation protocol: Thorazine 100mg  q4h PRN PO or 50mg  q4h PRN IM   PRN -Tylenol 650mg  q6h, pain -Maalox 67ml q4h, indigestion -Atarax 25mg  TID, anxiety -Milk of Mag 20mL, constipation -Trazodone 50mg  QHS, insomnia  - If not asleep by 11 pm Valium 10mg  QHS PRN PO or 10mg  IM PRN  Patient slept very well overnight, but had a brief episode of irritation this AM and attempted to call 911. Patient was able to be verbally redirected and did not require PRN.  On assessment today patient reports that he believes he smells "douche" and "candy." Patient talks a lot about his shampoo and deodorant at home and his belief that "someone" out "eau de toilette" in them and endorses that he does not like the scent. Patient reports that he is also still concerned about his water at home, but he would probably be ok to take a shower if he got new shampoo and Deoderant. Patient reports that he does not care about 06-17-09 today. Patient reports that this is because he does not want to "kill anything."  Patient reports that he has talked to his mother on the phone but both times did not go well and his mother yelled at him on one occasion.  Today patient denies SI, HI and AVH. There is some concern for  olfactory hallucinations.  Principal Problem: Schizoaffective disorder, bipolar type ([place]) Diagnosis: Principal Problem:   Schizoaffective disorder, bipolar type ([place]) Active Problems:   Anxiety state  Total Time spent with patient: 15 minutes  Past Psychiatric History: See H&P  Past Medical History:  Past Medical History:  Diagnosis Date   Asthma    CHF (congestive heart failure) (HCC)    Chronic back pain    COPD (chronic obstructive pulmonary disease) (HCC)    Knee pain, chronic    Migraines    Schizophrenia, schizo-affective ([place])     Past Surgical History:  Procedure Laterality Date   KNEE SURGERY     four times   WISDOM TOOTH EXTRACTION     Family History:  Family History  Problem Relation Age of Onset   Hypertension Father    Family Psychiatric  History: See H&P Social History:  Social History   Substance and Sexual Activity  Alcohol Use No   Comment: occ     Social History   Substance and Sexual Activity  Drug Use No   Comment: Pt denied    Social History   Socioeconomic History   Marital status: Widowed    Spouse name: Not on file   Number of children: Not on file   Years of education: Not on file   Highest education level: Not on file  Occupational History   Occupation: on disability  Tobacco Use  Smoking status: Former    Packs/day: 1.00    Years: 33.00    Pack years: 33.00    Types: Cigarettes   Smokeless tobacco: Never  Vaping Use   Vaping Use: Never used  Substance and Sexual Activity   Alcohol use: No    Comment: occ   Drug use: No    Comment: Pt denied   Sexual activity: Not Currently  Other Topics Concern   Not on file  Social History Narrative   Pt is a widower.  He lives alone, and he receives SSI Disability.  Pt was followed by Dr. Darleene Cleaver, but he has not seen Dr. Darleene Cleaver in several months.   Social Determinants of Health   Financial Resource Strain: Not on file  Food Insecurity: Not on file  Transportation  Needs: Not on file  Physical Activity: Not on file  Stress: Not on file  Social Connections: Not on file   Additional Social History:                         Sleep: Good  Appetite:  Good  Current Medications: Current Facility-Administered Medications  Medication Dose Route Frequency Provider Last Rate Last Admin   acetaminophen (TYLENOL) tablet 650 mg  650 mg Oral Q6H PRN Rozetta Nunnery, NP   650 mg at 09/21/21 0811   alum & mag hydroxide-simeth (MAALOX/MYLANTA) 200-200-20 MG/5ML suspension 30 mL  30 mL Oral Q4H PRN Lindon Romp A, NP   30 mL at 09/20/21 2585   chlorproMAZINE (THORAZINE) tablet 50 mg  50 mg Oral q AM Hill, Jackie Plum, MD   50 mg at 09/21/21 2778   Or   chlorproMAZINE (THORAZINE) injection 25 mg  25 mg Intramuscular q AM Hill, Jackie Plum, MD       chlorproMAZINE (THORAZINE) tablet 100 mg  100 mg Oral Q4H PRN Hill, Jackie Plum, MD       Or   chlorproMAZINE (THORAZINE) injection 50 mg  50 mg Intramuscular Q4H PRN Hill, Jackie Plum, MD       chlorproMAZINE (THORAZINE) tablet 100 mg  100 mg Oral QHS Hill, Jackie Plum, MD   100 mg at 09/19/21 1958   Or   chlorproMAZINE (THORAZINE) injection 50 mg  50 mg Intramuscular QHS Hill, Jackie Plum, MD   50 mg at 09/20/21 2101   diazepam (VALIUM) tablet 10 mg  10 mg Oral QHS PRN Maida Sale, MD       Or   diazepam (VALIUM) injection 10 mg  10 mg Intramuscular QHS PRN Hill, Jackie Plum, MD       hydrOXYzine (ATARAX) tablet 25 mg  25 mg Oral TID PRN Lindon Romp A, NP       lidocaine (LIDODERM) 5 % 1 patch  1 patch Transdermal Q24H Hill, Jackie Plum, MD       magnesium hydroxide (MILK OF MAGNESIA) suspension 30 mL  30 mL Oral Daily PRN Rozetta Nunnery, NP        Lab Results: No results found for this or any previous visit (from the past 31 hour(s)).  Blood Alcohol level:  Lab Results  Component Value Date   [facility] <10 04/27/2021   ETH <10 24/23/5361    Metabolic Disorder  Labs: Lab Results  Component Value Date   HGBA1C 5.5 09/14/2021   MPG 111.15 09/14/2021   MPG 114.02 01/17/2020   Lab Results  Component Value Date   PROLACTIN 20.8 (H) 09/14/2021  PROLACTIN 16.1 (H) 01/17/2020   Lab Results  Component Value Date   CHOL 185 09/14/2021   TRIG 137 09/14/2021   HDL 44 09/14/2021   CHOLHDL 4.2 09/14/2021   VLDL 27 09/14/2021   LDLCALC 114 (H) 09/14/2021   LDLCALC 84 01/17/2020    Physical Findings: AIMS:  , ,  ,  ,    CIWA:    COWS:     Musculoskeletal: Strength & Muscle Tone: within normal limits Gait & Station: normal Patient leans: N/A  Psychiatric Specialty Exam:  Presentation  General Appearance: Casual  Eye Contact:Poor  Speech:Slurred  Speech Volume:Decreased  Handedness:Right   Mood and Affect  Mood:-- (less irritable)  Affect:Restricted   Thought Process  Thought Processes:Goal Directed  Descriptions of Associations:Tangential  Orientation:Full (Time, Place and Person)  Thought Content:Illogical; Paranoid Ideation  History of Schizophrenia/Schizoaffective disorder:Yes  Duration of Psychotic Symptoms:Greater than six months  Hallucinations:Hallucinations: -- (possible olfactory)  Ideas of Reference:Paranoia  Suicidal Thoughts:Suicidal Thoughts: No  Homicidal Thoughts:Homicidal Thoughts: No   Sensorium  Memory:Immediate Fair; Recent Fair  Judgment:-- (Improving)  Insight:None   Executive Functions  Concentration:Poor  Attention Span:Fair  [place] of Knowledge:Poor  Language:Poor   Psychomotor Activity  Psychomotor Activity:Psychomotor Activity: Decreased   Assets  Assets:Resilience; Housing   Sleep  Sleep:Sleep: Good    Physical Exam: Physical Exam HENT:     Head: Normocephalic and atraumatic.  Pulmonary:     Effort: Pulmonary effort is normal.  Neurological:     Mental Status: He is alert.   Review of Systems  Psychiatric/Behavioral:  Negative for  suicidal ideas. The patient does not have insomnia.   Blood pressure 114/76, pulse 78, temperature 98 F (36.7 C), temperature source Oral, resp. rate 18, height 6' (1.829 m), weight 83 kg, SpO2 97 %. Body mass index is 24.82 kg/m.   Treatment Plan Summary: Daily contact with patient to assess and evaluate symptoms and progress in treatment and Medication management [name] is a 51 yo patient w/ PPH of schizoaffective disorder, bipolar type who is well known to [facility]. Patient irritability appears to be improving as does his paranoia about being poisoned. He still appears to believe that things in his home are being tampered with. Most concerning is that he endorses what appears to be olfactory hallucinations today. Patient is preoccupied with these hallucinations and they seem to be apart of his new paranoid endorsements of his home items being tampered with. Will reassess tom Schizoaffective disorder, bipolar type - Continue Thorazine 100mg  TID - Continue IVC - Agitation protocol: Thorazine 100mg  q4h PRN PO or 50mg  q4h PRN IM      PRN -Tylenol 650mg  q6h, pain -Maalox 61ml q4h, indigestion -Atarax 25mg  TID, anxiety -Milk of Mag 37mL, constipation -Trazodone 50mg  QHS, insomnia  - If not asleep by 11 pm Valium 10mg  QHS PRN PO or 10mg  IM PRN      PGY-2 Freida Busman, MD 09/21/2021, 1:55 PM

## 2021-09-21 NOTE — Progress Notes (Signed)
°   09/21/21 0530  Sleep  Number of Hours 7

## 2021-09-21 NOTE — Group Note (Signed)
LCSW Group Therapy Note 09/21/2021  11:15am-12:00pm  Type of Therapy and Topic:  Group Therapy: Anger and Commonalities  Participation Level:  Active   Description of Group: In this group, patients initially shared an "unknown" fact about themselves and CSW led a discussion about the ways in which we have things in common without realizing it.  Patient then identified a recent time they became angry and how this yet again showed a way in which they had something in common with other patients.  We discussed possible unhealthy reactions to anger and possible healthy reactions.  We also discussed possible underlying emotions that lead to the anger.  Commonalities among group members were pointed out throughout the entirety of group.  Therapeutic Goals: Patients were asked to share something about themselves and learned that they often have things in common with other people without knowing this Patients will remember an incident of anger and how they reacted Patients will be able to identify their reaction as healthy or unhealthy, and identify possible reactions that would have been the opposite Patients will learn that anger itself is a secondary emotion and will think about their primary emotion at the time of their last incident of anger  Summary of Patient Progress:  The patient shared that something that is a frequent cause of anger for him is being lied to, misunderstood, and stolen from and said he typically will respond by "shouting at people who aren't there" and "having false belief systems."  He was too disorganized to respond to questions about whether this worked for him or not.  Therapeutic Modalities:   Cognitive Behavioral Therapy  Maretta Los, LCSW 09/21/2021  2:58 PM

## 2021-09-21 NOTE — Progress Notes (Signed)
Pt is getting irritable because he claims that "Too many Africans runs this place and they need to go back over [employer] explained to pt that he needs to not make everything about race and all staff is here to take care of all patients. Pt proceeded to get on the phone and attempt to call 911 but was redirected. Pt then decided to call his mother and ask for her to call 911 and report that there are way too many Africans running this place. Pt was redirected and reminded on the unit rules. If pt continues, pt will be placed on phone restriction. Pt will continued to be monitored on the unit.

## 2021-09-21 NOTE — BHH Group Notes (Signed)
Adult Psychoeducational Group Note  Date:  09/21/2021 Time:  11:19 PM  Group Topic/Focus:  Goals Group:   The focus of this group is to help patients establish daily goals to achieve during treatment and discuss how the patient can incorporate goal setting into their daily lives to aide in recovery.  Participation Level:  Active  Participation Quality:  Attentive  Affect:  Appropriate  Cognitive:  Appropriate  Insight: Appropriate  Engagement in Group:  Engaged  Modes of Intervention:  Discussion  Additional Comments  Dalene Carrow 09/21/2021, 11:19 PM

## 2021-09-22 NOTE — Group Note (Signed)
[place] LCSW Group Therapy Note  Date/Time:  09/22/2021  11:00AM-12:00PM  Type of Therapy and Topic:  Group Therapy:  Music and Mood  Participation Level:  Minimal   Description of Group: In this process group, members listened to a variety of genres of music and identified that different types of music evoke different responses.  Patients were encouraged to identify music that was soothing for them and music that was energizing for them.  Patients discussed how this knowledge can help with wellness and recovery in various ways including managing depression and anxiety as well as encouraging healthy sleep habits.    Therapeutic Goals: Patients will explore the impact of different varieties of music on mood Patients will verbalize the thoughts they have when listening to different types of music Patients will identify music that is soothing to them as well as music that is energizing to them Patients will discuss how to use this knowledge to assist in maintaining wellness and recovery Patients will explore the use of music as a coping skill  Summary of Patient Progress:  At the beginning of group, patient expressed his mood was okay but he had some pain emanating from a cramp in his right side.  Throughout the group he spoke almost incessantly despite being asked numerous times to allow other group members to hear the songs.  He kept saying "I'll be quiet" but within 5 seconds was talking again.  He complained about each song, but eventually sang along with the songs.  At the end of group, patient expressed his pain was almost gone.    Therapeutic Modalities: Solution Focused Brief Therapy Activity   Selmer Dominion, LCSW

## 2021-09-22 NOTE — BHH Group Notes (Signed)
[facility] Group Notes:  (Nursing/MHT/Case Management/Adjunct)  Date:  09/22/2021  Time:  3:43 PM  Type of Therapy:   Therapeutic Relaxation  Participation Level:  Minimal  Participation Quality:  Appropriate  Affect:  Appropriate  Cognitive:  Appropriate  Insight:  Good and Improving  Engagement in Group:  Engaged and Improving  Modes of Intervention:  Activity, Education, and Exploration  Summary of Progress/Problems: Group consisted on relaxing nature music  followed by deep breathing exercises and a brief discussion on what helps one relax. Pt stated that watching funny movies and fishing helps relax him.   Chrystina Naff J Dorris Vangorder 09/22/2021, 3:43 PM

## 2021-09-22 NOTE — Progress Notes (Signed)
Adult Psychoeducational Group Note  Date:  09/22/2021 Time:  9:03 PM  Group Topic/Focus:  Wrap-Up Group:   The focus of this group is to help patients review their daily goal of treatment and discuss progress on daily workbooks.  Participation Level:  Active  Participation Quality:  Appropriate  Affect:  Appropriate  Cognitive:  Appropriate  Insight: Appropriate  Engagement in Group:  Engaged  Modes of Intervention:  Discussion  Additional Comments:  Pt stated his goal for today was to focus on his treatment plan. Pt stated he accomplished his goal today. Pt stated he talked with his doctor and social worker about his care today. Pt rated his overall day a 6 out of 10. Pt stated he was able to contact his mother today which improved his overall day. Pt stated he felt better about himself today. Pt stated he was able to attend all meals. Pt stated he took all medications provided today. Pt stated he attend all groups held today. Pt stated his appetite was pretty good today. Pt rated sleep last night was pretty good. Pt stated the goal tonight was to get some rest. Pt stated he had some physical pain tonight. Pt stated he had some moderate pain in his neck tonight. Pt rated the moderated pain his neck a 7 on the pain level scale. Pt nurse was update on the situation.  Pt deny visual hallucinations and auditory issues tonight. Pt denies thoughts of harming himself or others. Pt stated he would alert staff if anything changed  Candy Sledge 09/22/2021, 9:03 PM

## 2021-09-22 NOTE — BHH Group Notes (Signed)
Adult Psychoeducational Group Note  Date:  09/22/2021 Time:  11:24 AM  Group Topic/Focus:  Goals Group:   The focus of this group is to help patients establish daily goals to achieve during treatment and discuss how the patient can incorporate goal setting into their daily lives to aide in recovery.  Participation Level:  Active  Participation Quality:  Attentive  Affect:  Flat  Cognitive:  Appropriate  Insight: Appropriate  Engagement in Group:  Engaged  Modes of Intervention:  Education, Orientation, and Socialization  Additional Comments:  Pt attended group and participated. Pt's goal for today is "breathing exercises."  Harriet Masson 09/22/2021, 11:24 AM

## 2021-09-22 NOTE — Progress Notes (Signed)
Patient has been up in the dayroom most of the evening watching tv. He attended group tonight. He c/o neck pain at beginning of shift and received tylenol to aid with pain. He was compliant with his medication but complained that he does not like to take Thorazine.

## 2021-09-22 NOTE — Progress Notes (Addendum)
[facility] MD Progress Note  09/22/2021 12:38 PM [name]  MRN:  [id number] Subjective:   [name] Dibari is a 51 yo patient w/ PPH of schizoaffective disorder, bipolar type who presented to [facility] w/ IVC endorsing thoughts that he was being poisoned, HI, poor med compliance, and threatening family members. Patient was at the [facility] 3 days and transferred to [facility] on the 4th day. While at [facility] patient received Zyprexa IM due to his aggression and agitation.   Case was discussed in the multidisciplinary team. MAR was reviewed and patient was compliant with medications.  He did not require any PRN's for agitation.     Psychiatric Team made the following recommendations yesterday:  - Continue Thorazine 50mg  daily and 100mg  QHS - Continue IVC - Agitation protocol: Thorazine 100mg  q4h PRN PO or 50mg  q4h PRN IM         PRN -Tylenol 650mg  q6h, pain -Maalox 15ml q4h, indigestion -Atarax 25mg  TID, anxiety -Milk of Mag 25mL, constipation -Trazodone 50mg  QHS, insomnia  - If not asleep by 11 pm Valium 10mg  QHS PRN PO or 10mg  IM PRN  ON assessment today patient reports that he is not smelling anything strange. Patient reports that he slept well last night and recalls that he had to have PRN to get to sleep at the encouraged time. Patient denies feeling oversedated today. Patient reports that he is not worried about his items being poisoned. Patient endorses that he is concerned about his aches and pains and that he cannot feel all of his internally organs. Patient denies SI, HI and AVH.  Weekend SW endorsed that she knew patient almost 20 years ago and endorses that his speech is baseline. Other staff feel that patient's behavior has improved over the past 24 hrs and he is more appropriate.   Principal Problem: Schizoaffective disorder, bipolar type ([place]) Diagnosis: Principal Problem:   Schizoaffective disorder, bipolar type ([place]) Active Problems:   Anxiety state  Total Time spent with patient: 15  minutes  Past Psychiatric History: See H&P  Past Medical History:  Past Medical History:  Diagnosis Date   Asthma    CHF (congestive heart failure) (HCC)    Chronic back pain    COPD (chronic obstructive pulmonary disease) (HCC)    Knee pain, chronic    Migraines    Schizophrenia, schizo-affective ([place])     Past Surgical History:  Procedure Laterality Date   KNEE SURGERY     four times   WISDOM TOOTH EXTRACTION     Family History:  Family History  Problem Relation Age of Onset   Hypertension Father    Family Psychiatric  History: See H&P Social History:  Social History   Substance and Sexual Activity  Alcohol Use No   Comment: occ     Social History   Substance and Sexual Activity  Drug Use No   Comment: Pt denied    Social History   Socioeconomic History   Marital status: Widowed    Spouse name: Not on file   Number of children: Not on file   Years of education: Not on file   Highest education level: Not on file  Occupational History   Occupation: on disability  Tobacco Use   Smoking status: Former    Packs/day: 1.00    Years: 33.00    Pack years: 33.00    Types: Cigarettes   Smokeless tobacco: Never  Vaping Use   Vaping Use: Never used  Substance and Sexual Activity  Alcohol use: No    Comment: occ   Drug use: No    Comment: Pt denied   Sexual activity: Not Currently  Other Topics Concern   Not on file  Social History Narrative   Pt is a widower.  He lives alone, and he receives SSI Disability.  Pt was followed by Dr. Darleene Cleaver, but he has not seen Dr. Darleene Cleaver in several months.   Social Determinants of Health   Financial Resource Strain: Not on file  Food Insecurity: Not on file  Transportation Needs: Not on file  Physical Activity: Not on file  Stress: Not on file  Social Connections: Not on file   Additional Social History:                         Sleep: Fair  Appetite:  Good  Current Medications: Current  Facility-Administered Medications  Medication Dose Route Frequency Provider Last Rate Last Admin   acetaminophen (TYLENOL) tablet 650 mg  650 mg Oral Q6H PRN Lindon Romp A, NP   650 mg at 09/22/21 0736   alum & mag hydroxide-simeth (MAALOX/MYLANTA) 200-200-20 MG/5ML suspension 30 mL  30 mL Oral Q4H PRN Lindon Romp A, NP   30 mL at 09/20/21 0626   chlorproMAZINE (THORAZINE) tablet 50 mg  50 mg Oral q AM Hill, Jackie Plum, MD   50 mg at 09/22/21 0631   Or   chlorproMAZINE (THORAZINE) injection 25 mg  25 mg Intramuscular q AM Hill, Jackie Plum, MD       chlorproMAZINE (THORAZINE) tablet 100 mg  100 mg Oral Q4H PRN Hill, Jackie Plum, MD       Or   chlorproMAZINE (THORAZINE) injection 50 mg  50 mg Intramuscular Q4H PRN Hill, Jackie Plum, MD       chlorproMAZINE (THORAZINE) tablet 100 mg  100 mg Oral QHS Hill, Jackie Plum, MD   100 mg at 09/21/21 2039   Or   chlorproMAZINE (THORAZINE) injection 50 mg  50 mg Intramuscular QHS Hill, Jackie Plum, MD   50 mg at 09/20/21 2101   hydrOXYzine (ATARAX) tablet 25 mg  25 mg Oral TID PRN Lindon Romp A, NP       lidocaine (LIDODERM) 5 % 1 patch  1 patch Transdermal Q24H Hill, Jackie Plum, MD   1 patch at 09/21/21 1423   magnesium hydroxide (MILK OF MAGNESIA) suspension 30 mL  30 mL Oral Daily PRN Rozetta Nunnery, NP        Lab Results: No results found for this or any previous visit (from the past 31 hour(s)).  Blood Alcohol level:  Lab Results  Component Value Date   ETH <10 04/27/2021   ETH <10 94/85/4627    Metabolic Disorder Labs: Lab Results  Component Value Date   HGBA1C 5.5 09/14/2021   MPG 111.15 09/14/2021   MPG 114.02 01/17/2020   Lab Results  Component Value Date   PROLACTIN 20.8 (H) 09/14/2021   PROLACTIN 16.1 (H) 01/17/2020   Lab Results  Component Value Date   CHOL 185 09/14/2021   TRIG 137 09/14/2021   HDL 44 09/14/2021   CHOLHDL 4.2 09/14/2021   VLDL 27 09/14/2021   LDLCALC 114 (H) 09/14/2021    LDLCALC 84 01/17/2020    Physical Findings: AIMS:  , ,  ,  ,    CIWA:    COWS:     Musculoskeletal: Strength & Muscle Tone: within normal limits Gait & Station: normal Patient leans:  N/A  Psychiatric Specialty Exam:  Presentation  General Appearance: Casual  Eye Contact:Poor  Speech:Slurred  Speech Volume:Decreased  Handedness:Right   Mood and Affect  Mood:-- (less irritable)  Affect:Restricted   Thought Process  Thought Processes:Goal Directed  Descriptions of Associations:Tangential  Orientation:Full (Time, Place and Person)  Thought Content:Illogical; Paranoid Ideation  History of Schizophrenia/Schizoaffective disorder:Yes  Duration of Psychotic Symptoms:Greater than six months  Hallucinations:Hallucinations: -- (possible olfactory)  Ideas of Reference:Paranoia  Suicidal Thoughts:Suicidal Thoughts: No  Homicidal Thoughts:Homicidal Thoughts: No   Sensorium  Memory:Immediate Fair; Recent Fair  Judgment:-- (Improving)  Insight:None   Executive Functions  Concentration:Poor  Attention Span:Fair  Williams of Knowledge:Poor  Language:Poor   Psychomotor Activity  Psychomotor Activity:Psychomotor Activity: Decreased   Assets  Assets:Resilience; Housing   Sleep  Sleep:Sleep: Good    Physical Exam: Physical Exam Constitutional:      Appearance: Normal appearance.  HENT:     Head: Normocephalic and atraumatic.  Pulmonary:     Effort: Pulmonary effort is normal.  Neurological:     Mental Status: He is alert and oriented to person, place, and time.   Review of Systems  Psychiatric/Behavioral:  Negative for hallucinations and suicidal ideas. The patient has insomnia.   Blood pressure 114/76, pulse 78, temperature 98 F (36.7 C), temperature source Oral, resp. rate 18, height 6' (1.829 m), weight 83 kg, SpO2 97 %. Body mass index is 24.82 kg/m.   Treatment Plan Summary: Daily contact with patient to assess and  evaluate symptoms and progress in treatment and Medication management [name] Clack is a 51 yo patient w/ PPH of schizoaffective disorder, bipolar type who is well known to [facility]. Patient was compliant with his medications today and has been less of a distraction on the unit. Patient did require a PRN for sleep last night. Patient is less preoccupied with his delusions and is only talking about his aches and pains in reality. Patient also appears less paranoid.  Schizoaffective disorder, bipolar type - Continue Thorazine 50mg  daily and 100mg  QHS - Continue IVC - Agitation protocol: Thorazine 100mg  q4h PRN PO or 50mg  q4h PRN IM         PRN -Tylenol 650mg  q6h, pain -Maalox 75ml q4h, indigestion -Atarax 25mg  TID, anxiety -Milk of Mag 75mL, constipation -Trazodone 50mg  QHS, insomnia       PGY-2 Freida Busman, MD 09/22/2021, 12:38 PM

## 2021-09-22 NOTE — Progress Notes (Signed)
°   09/21/21 2100  Psych Admission Type (Psych Patients Only)  Admission Status Involuntary  Psychosocial Assessment  Patient Complaints Agitation  Eye Contact Intense  Facial Expression Flat  Affect Labile  Speech Rapid;Tangential  Interaction Assertive  Motor Activity Restless  Appearance/Hygiene Unremarkable  Behavior Characteristics Appropriate to situation  Mood Preoccupied  Aggressive Behavior  Targets Self;Property  Type of Behavior Rage;Verbal  Effect No apparent injury  Thought Horticulturist, commercial of ideas;Tangential;Disorganized  Content Preoccupation;Blaming others;Paranoia  Delusions Paranoid;Grandeur;Persecutory  Perception Derealization  Hallucination None reported or observed  Judgment Poor  Confusion Mild  Danger to Self  Current suicidal ideation? Denies  Danger to Others  Danger to Others None reported or observed

## 2021-09-23 DIAGNOSIS — M479 Spondylosis, unspecified: Secondary | ICD-10-CM | POA: Diagnosis present

## 2021-09-23 DIAGNOSIS — G8929 Other chronic pain: Secondary | ICD-10-CM | POA: Diagnosis present

## 2021-09-23 DIAGNOSIS — M542 Cervicalgia: Secondary | ICD-10-CM | POA: Diagnosis present

## 2021-09-23 DIAGNOSIS — F250 Schizoaffective disorder, bipolar type: Principal | ICD-10-CM

## 2021-09-23 DIAGNOSIS — M40209 Unspecified kyphosis, site unspecified: Secondary | ICD-10-CM | POA: Insufficient documentation

## 2021-09-23 MED ORDER — CHLORPROMAZINE HCL 50 MG PO TABS: 50.0000 mg | ORAL_TABLET | Freq: Every morning | ORAL | 0 refills | Status: DC

## 2021-09-23 MED ORDER — CHLORPROMAZINE HCL 100 MG PO TABS: 100.0000 mg | ORAL_TABLET | Freq: Every day | ORAL | 0 refills | Status: DC

## 2021-09-23 NOTE — Plan of Care (Signed)
Patient attended and participated in recreation therapy groups in a calm and appropriate mood.   Victorino Sparrow, LRT,CTRS

## 2021-09-23 NOTE — Progress Notes (Signed)
D: Pt A & O X 3. Denies SI, HI, AVH and pain at this time. D/C home as ordered. Picked up in lobby by his mother.  A: D/C instructions reviewed with pt including prescriptions and follow up appointment; compliance encouraged. All belongings from locker 16 given to pt at time of departure. Scheduled medications given with verbal education and effects monitored. Safety checks maintained without incident till time of d/c.  R: Pt receptive to care. Compliant with medications when offered. Denies adverse drug reactions when assessed. Verbalized understanding related to d/c instructions. Signed belonging sheet in agreement with items received from locker. Ambulatory with a steady gait. Appears to be in no physical distress at time of departure.

## 2021-09-23 NOTE — BHH Suicide Risk Assessment (Signed)
[facility] INPATIENT:  Family/Significant Other Suicide Prevention Education  Suicide Prevention Education:  Contact Attempts: mother [name] [phone]), has been identified by the patient as the family member/significant other with whom the patient will be residing, and identified as the person(s) who will aid the patient in the event of a mental health crisis.  With written consent from the patient, two attempts were made to provide suicide prevention education, prior to and/or following the patient's discharge.  We were unsuccessful in providing suicide prevention education.  A suicide education pamphlet was given to the patient to share with family/significant other.  Date and time of first attempt:09/20/2021 / 10:48am CSW attempted to reach this patients mother. CSW was unable to leave voicemail due to voicemail box being full.  Date and time of second attempt: 09/23/21 / 9:35am CSW unable to reach patients mother and unable to leave voicemail.    [name] 09/23/2021, 9:37 AM

## 2021-09-23 NOTE — Group Note (Signed)
Recreation Therapy Group Note   Group Topic:Personal Development  Group Date: 09/23/2021 Start Time: 1000 End Time: 1025 Facilitators: Victorino Sparrow, LRT,CTRS Location: [address] Dayroom   Goal Area(s) Addresses:   Patient will successfully identify benefit of reflecting on overcoming past challenges. Patient will successfully identify one thing they would do different if given the opportunity. Patient will identify how to take lessons learned and use them post d/c.   Group Description:  Letter To My Younger Self.  Patients were given a blank outline of a mirror.  Patients were to then write a letter/advice to their younger selves.  Patients will express what they would taught themselves that would have had allowed them to make better decisions right now.   Affect/Mood: Appropriate   Participation Level: Engaged   Participation Quality: Independent   Behavior: Appropriate   Speech/Thought Process: Barely audible  and Incoherent   Insight: Moderate   Judgement: Moderate   Modes of Intervention: Worksheet   Patient Response to Interventions:  Engaged   Education Outcome:  Acknowledges education and In group clarification offered    Clinical Observations/Individualized Feedback: Pt was hard to hear and some of the things stated didn't make sense.  Pt expressed he would tell his younger self "don't use pills of any kind", "cafffine is roach food", "all peanut butter and coffee are roasted which is why they taste like smoke" and "skip to Revelations in the bible at start reading there".  Pt was attentive and went through these long diatribes and ramblings about various things.     Plan: Continue to engage patient in RT group sessions 2-3x/week.   Victorino Sparrow, LRT,CTRS 09/23/2021 12:12 PM

## 2021-09-23 NOTE — BHH Suicide Risk Assessment (Signed)
[facility] Discharge Suicide Risk Assessment   Principal Problem: Schizoaffective disorder, bipolar type ([place]) Discharge Diagnoses: Principal Problem:   Schizoaffective disorder, bipolar type ([place]) Active Problems:   Chronic back pain   Anxiety state   Chronic neck pain   Osteoarthritis of spine   Total Time spent with patient: 20 minutes  Musculoskeletal: Strength & Muscle Tone: within normal limits Gait & Station: normal Patient leans: N/A and kyphosis  Psychiatric Specialty Exam  Presentation  General Appearance: Casual  Eye Contact:Poor  Speech:Slurred  Speech Volume:Decreased  Handedness:Right   Mood and Affect  Mood:-- (less irritable)  Duration of Depression Symptoms: No data recorded Affect:Restricted   Thought Process  Thought Processes:Goal Directed  Descriptions of Associations:Tangential  Orientation:Full (Time, Place and Person)  Thought Content:Illogical; Paranoid Ideation  History of Schizophrenia/Schizoaffective disorder:Yes  Duration of Psychotic Symptoms:Greater than six months  Hallucinations:No data recorded Ideas of Reference:Paranoia  Suicidal Thoughts:No data recorded Homicidal Thoughts:No data recorded  Sensorium  Memory:Immediate Fair; Recent Fair  Judgment:-- (Improving)  Insight:None   Executive Functions  Concentration:Poor  Attention Span:Fair  [place] of Knowledge:Poor  Language:Poor   Psychomotor Activity  Psychomotor Activity:No data recorded  Assets  Assets:Resilience; Housing   Sleep  Sleep:No data recorded  Physical Exam: Physical Exam Vitals and nursing note reviewed.  Constitutional:      Appearance: Normal appearance.  HENT:     Head: Normocephalic.     Nose: Nose normal.  Eyes:     Extraocular Movements: Extraocular movements intact.  Neck:     Comments: Forward bending Pulmonary:     Effort: Pulmonary effort is normal.  Neurological:     General: No focal deficit  present.     Mental Status: He is alert and oriented to person, place, and time.  Psychiatric:        Attention and Perception: Attention normal. He does not perceive auditory or visual hallucinations.        Speech: Speech is slurred (monotone).        Behavior: Behavior is cooperative.        Thought Content: Thought content is paranoid (stable, baselien) and delusional (persecutory, stable/baseline). Thought content does not include homicidal or suicidal ideation.        Cognition and Memory: Cognition is not impaired. Memory is impaired (distortions per delusional content).        Judgment: Judgment is impulsive.     Comments: Blunted affect/curmudgeon    Review of Systems  Constitutional:  Negative for fever.  HENT:  Negative for hearing loss.   Eyes:  Negative for blurred vision.  Respiratory:  Negative for cough.   Cardiovascular:  Positive for chest pain.  Gastrointestinal:  Negative for constipation, diarrhea, nausea and vomiting.  Genitourinary:  Negative for dysuria.  Musculoskeletal:  Positive for back pain, myalgias and neck pain.  Skin:        Acne   Neurological:  Negative for dizziness and headaches.  Psychiatric/Behavioral:  Negative for depression, hallucinations and suicidal ideas.   Blood pressure 114/76, pulse 78, temperature 98 F (36.7 C), temperature source Oral, resp. rate 18, height 6' (1.829 m), weight 83 kg, SpO2 97 %. Body mass index is 24.82 kg/m.  Mental Status Per Nursing Assessment::   On Admission:  NA  Demographic Factors:  Male, Caucasian, Low socioeconomic status, Living alone, and Unemployed  Loss Factors: Decline in physical health  Historical Factors: NA  Risk Reduction Factors:   NA  Continued Clinical Symptoms:  Dysthymia Chronic Pain  More than one psychiatric diagnosis Currently Psychotic Previous Psychiatric Diagnoses and Treatments Medical Diagnoses and Treatments/Surgeries  Cognitive Features That Contribute To Risk:   Closed-mindedness, Loss of executive function, and Thought constriction (tunnel vision)    Suicide Risk:  Mild:  Suicidal ideation of limited frequency, intensity, duration, and specificity.  There are no identifiable plans, no associated intent, mild dysphoria and related symptoms, good self-control (both objective and subjective assessment), few other risk factors, and identifiable protective factors, including available and accessible social support.   Follow-up Information     [facility] Follow up.   Why: ACTT is to follow up with you at discharge to continue services. Contact information: [place] [address] [phone]                 Plan Of Care/Follow-up recommendations:  Activity:  as tolerated Diet:  cardiac diet Other:    Prescriptions for new medications provided for the patient to bridge to follow up appointment. The patient was informed that refills for these prescriptions are generally not provided, and patient is encouraged to attend all follow up appointments to address medication refills and adjustments.   Today's discharge was reviewed with treatment team, and the team is in agreement that the patient is ready for discharge. The patient is was of the discharge plan for today and has been given opportunity to ask questions. At time of discharge, the patient does not vocalize any acute harm to self or others, is goal directed, able to advocate for self and organizational baseline.   At discharge, the patient is instructed to:  Take all medications as prescribed. Report any adverse effects and or reactions from the medicines to her outpatient provider promptly.  Do not engage in alcohol and/or illegal drug use while on prescription medicines.  In the event of worsening symptoms, patient is instructed to call the crisis hotline, 911 and or go to the nearest ED for appropriate evaluation and treatment of symptoms.   Follow-up with primary care provider for further care of medical issues, concerns and or health care needs. * Substance abuse follow up: it is recommended that you follow up with community support treatment, like AA/NA. It is also recommended that the patient attend 90 meetings in 90 days, otherwise known as "90 in [address]   Maida Sale, MD 09/23/2021, 9:56 AM

## 2021-09-23 NOTE — Discharge Summary (Addendum)
Physician Discharge Summary Note  Patient:  [name] is an 51 y.o., male MRN:  [id number] DOB:  15-Jan-1971 Patient phone:  [phone] (home)  Patient address:   [address] [place] [address],  Total Time spent with patient: 20 minutes  Date of Admission:  09/18/2021 Date of Discharge: 09/23/2021  Reason for Admission:  Endorsing thoughts that he was being poisoned, HI, poor med compliance, and threatening family members.   Principal Problem: Schizoaffective disorder, bipolar type [facility]) Discharge Diagnoses: Principal Problem:   Schizoaffective disorder, bipolar type ([place]) Active Problems:   Chronic back pain   Anxiety state   Chronic neck pain   Osteoarthritis of spine   Past Psychiatric History: Multiple prior psychitatirc hospitalization, most recent was 01/2020 here at [facility].    Prior Medications include: Invega sustena (patietn endorse feeling over sedated), Zyprexa (dual w/ Invega), Seroquel  Past Medical History:  Past Medical History:  Diagnosis Date   Asthma    CHF (congestive heart failure) (HCC)    Chronic back pain    COPD (chronic obstructive pulmonary disease) (HCC)    Knee pain, chronic    Migraines    Schizophrenia, schizo-affective (HCC)     Past Surgical History:  Procedure Laterality Date   KNEE SURGERY     four times   WISDOM TOOTH EXTRACTION     Family History:  Family History  Problem Relation Age of Onset   Hypertension Father    Family Psychiatric  History: Sister: schizophrenia Social History:  Social History   Substance and Sexual Activity  Alcohol Use No   Comment: occ     Social History   Substance and Sexual Activity  Drug Use No   Comment: Pt denied    Social History   Socioeconomic History   Marital status: Widowed    Spouse name: Not on file   Number of children: Not on file   Years of education: Not on file   Highest education level: Not on file  Occupational History   Occupation: on  disability  Tobacco Use   Smoking status: Former    Packs/day: 1.00    Years: 33.00    Pack years: 33.00    Types: Cigarettes   Smokeless tobacco: Never  Vaping Use   Vaping Use: Never used  Substance and Sexual Activity   Alcohol use: No    Comment: occ   Drug use: No    Comment: Pt denied   Sexual activity: Not Currently  Other Topics Concern   Not on file  Social History Narrative   Pt is a widower.  He lives alone, and he receives SSI Disability.  Pt was followed by Dr. Darleene Cleaver, but he has not seen Dr. Darleene Cleaver in several months.   Social Determinants of Health   Financial Resource Strain: Not on file  Food Insecurity: Not on file  Transportation Needs: Not on file  Physical Activity: Not on file  Stress: Not on file  Social Connections: Not on file    Hospital Course:  [name] Scharf is a 51 yo patient w/ PPH of schizoaffective disorder, bipolar type who presented to [facility] w/ IVC endorsing thoughts that he was being poisoned, HI, poor med compliance, and threatening family members. Patient was at the [facility] 3 days and transferred to [facility] on the 4th day. While at [facility] patient received Zyprexa IM due to his aggression and agitation.  Patient had received IM Zyprexa at the [facility] prior to his transfer to  [facility]. Per their documentation patient continued to be irritable despite these injections. Patient was started on thorazine 50mg  and 100mg  QHS for his paranoia, delusions and agitation. Patient refused to take his medications initially. Patient was preoccupied with the belief that he did not need mental health assistance and that he needed an Xray of his back. Patient was noted to have a hx of scoliosis and prominent kyphosis. Patient received his Xray and once he was made aware that there was no acute abnormality found, he willingly took his prescribed medications. Patient at no point became so irritable that he could not be redirected and never require PRNs for agitation. Patient slowly  became less occupied with the delusion that items in his home were being poisoned or tampered with . Patient also began sleeping better. Patient was noted to appear slow and with slurred speech; however it was confirmed by a staff member that had cared for him almost 20 years ago that this was his baseline. Patient interacted well with others on the unit. Patient remained compliant with his medicine and eventually began to endorse that he no longer felt he could not use the liquid products or water in his home. Patient did briefly have an episode of what appeared to be olfactory hallucination's however this cleared and patient went 48 hrs prior to discharge of no hallucination. At discharge patient denied SI, HI, and AVH. Patient endorsed he was no longer concerned that his items were poisoned and that he felt comfortable to return home. Physical Findings: AIMS: Facial and Oral Movements Muscles of Facial Expression: None, normal Lips and Perioral Area: None, normal Jaw: None, normal Tongue: None, normal,Extremity Movements Upper (arms, wrists, hands, fingers): None, normal Lower (legs, knees, ankles, toes): None, normal, Trunk Movements Neck, shoulders, hips: None, normal, Overall Severity Severity of abnormal movements (highest score from questions above): None, normal Incapacitation due to abnormal movements: None, normal Patient's awareness of abnormal movements (rate only patient's report): No Awareness, Dental Status Current problems with teeth and/or dentures?: No Does patient usually wear dentures?: No  CIWA:    COWS:     Musculoskeletal: Strength & Muscle Tone: decreased Gait & Station:  Shorter gait, but not a shuffle Patient leans: Front   Psychiatric Specialty Exam:  Presentation  General Appearance: Appropriate for Environment  Eye Contact:Fair  Speech:Slurred (Baseline)  Speech Volume:Normal  Handedness:Right   Mood and Affect   Mood:Euthymic  Affect:Appropriate   Thought Process  Thought Processes:Coherent  Descriptions of Associations:Intact  Orientation:Full (Time, Place and Person)  Thought Content:-- (Less obsessed and based in reality)  History of Schizophrenia/Schizoaffective disorder:Yes  Duration of Psychotic Symptoms:Greater than six months  Hallucinations:Hallucinations: None  Ideas of Reference:Paranoia  Suicidal Thoughts:Suicidal Thoughts: No  Homicidal Thoughts:Homicidal Thoughts: No   Sensorium  Memory:Immediate Fair; Recent Fair; Remote Fair  Judgment:-- (Improved)  Insight:Shallow   Executive Functions  Concentration:Fair  Attention Span:Fair  [place]   Psychomotor Activity  Psychomotor Activity:Psychomotor Activity: Decreased  Assets  Assets:Resilience; Housing   Sleep  Sleep:Sleep: Good   Physical Exam: Physical Exam Constitutional:      Appearance: Normal appearance.  HENT:     Head: Normocephalic and atraumatic.  Pulmonary:     Effort: Pulmonary effort is normal.  Neurological:     Mental Status: He is alert and oriented to person, place, and time.   Review of Systems  Psychiatric/Behavioral:  Negative for depression, hallucinations and suicidal ideas. The patient does not have insomnia.  Blood pressure 114/76, pulse 78, temperature 98 F (36.7 C), temperature source Oral, resp. rate 18, height 6' (1.829 m), weight 83 kg, SpO2 97 %. Body mass index is 24.82 kg/m.   Social History   Tobacco Use  Smoking Status Former   Packs/day: 1.00   Years: 33.00   Pack years: 33.00   Types: Cigarettes  Smokeless Tobacco Never   Tobacco Cessation:  N/A, patient does not currently use tobacco products   Blood Alcohol level:  Lab Results  Component Value Date   ETH <10 04/27/2021   ETH <10 30/16/0109    Metabolic Disorder Labs:  Lab Results  Component Value Date   HGBA1C 5.5 09/14/2021   MPG  111.15 09/14/2021   MPG 114.02 01/17/2020   Lab Results  Component Value Date   PROLACTIN 20.8 (H) 09/14/2021   PROLACTIN 16.1 (H) 01/17/2020   Lab Results  Component Value Date   CHOL 185 09/14/2021   TRIG 137 09/14/2021   HDL 44 09/14/2021   CHOLHDL 4.2 09/14/2021   VLDL 27 09/14/2021   LDLCALC 114 (H) 09/14/2021   LDLCALC 84 01/17/2020    See Psychiatric Specialty Exam and Suicide Risk Assessment completed by Attending Physician prior to discharge.  Discharge destination:  Home  Is patient on multiple antipsychotic therapies at discharge:  No   Has Patient had three or more failed trials of antipsychotic monotherapy by history:  No  Recommended Plan for Multiple Antipsychotic Therapies: NA   Allergies as of 09/23/2021       Reactions   Amoxicillin Diarrhea   Has patient had a PCN reaction causing immediate rash, facial/tongue/throat swelling, SOB or lightheadedness with hypotension: No Has patient had a PCN reaction causing severe rash involving mucus membranes or skin necrosis: No Has patient had a PCN reaction that required hospitalization: No Has patient had a PCN reaction occurring within the last 10 years: No If all of the above answers are "NO", then may proceed with Cephalosporin use. Pt did not elaborate   Dextromethorphan-guaifenesin Other (See Comments)   unknown   Haloperidol Other (See Comments)   unspecified   Haloperidol Lactate    Meloxicam Other (See Comments)   unspecified   Nsaids Other (See Comments)   Rectal bleeding   Quetiapine Other (See Comments)   Psychosis with high dose (600mg )   Sudafed [pseudoephedrine Hcl] Other (See Comments)   Hallucinations   Ziprasidone Other (See Comments)   unspecified   Prednisone Other (See Comments), Anxiety   Psychotic episodes  Patient states it makes me psychotic        Medication List     STOP taking these medications    ALPRAZolam 1 MG tablet Commonly known as: Maryann Alar Sustenna  156 MG/ML Susy injection Generic drug: paliperidone       TAKE these medications      Indication  albuterol 108 (90 Base) MCG/ACT inhaler Commonly known as: VENTOLIN HFA Inhale 2 puffs into the lungs every 4 (four) hours as needed for wheezing or shortness of breath. What changed: additional instructions  Indication: Asthma, Chronic Obstructive Lung Disease   chlorproMAZINE 100 MG tablet Commonly known as: THORAZINE Take 1 tablet (100 mg total) by mouth at bedtime.  Indication: Schizophrenia   chlorproMAZINE 50 MG tablet Commonly known as: THORAZINE Take 1 tablet (50 mg total) by mouth in the morning. Start taking on: September 24, 2021  Indication: Schizophrenia        Follow-up Information  [facility] Follow up.   Why: ACTT is to follow up with you at discharge to continue services. Contact information: [place] [address] [phone]                 Follow-up recommendations:  Follow up recommendations: - Activity as tolerated. - Diet as recommended by PCP. - Keep all scheduled follow-up appointments as recommended.   Comments:  Patient is instructed to take all prescribed medications as recommended. Report any side effects or adverse reactions to your outpatient psychiatrist. Patient is instructed to abstain from alcohol and illegal drugs while on prescription medications. In the event of worsening symptoms, patient is instructed to call the crisis hotline, 911, or go to the nearest emergency department for evaluation and treatment.    Signed:  PGY-2 Freida Busman, MD 09/23/2021, 5:17 PM

## 2021-09-23 NOTE — Progress Notes (Signed)
°  [facility] Adult Case Management Discharge Plan :  Will you be returning to the same living situation after discharge:  Yes,  to home At discharge, do you have transportation home?: Yes,  mother to pick this pt up Do you have the ability to pay for your medications: Yes,  hs insurance  Release of information consent forms completed and in the chart;  Patient's signature needed at discharge.  Patient to Follow up at:  Follow-up Information     [facility] Follow up.   Why: ACTT is to follow up with you at discharge to continue services. Contact information: [place]  [address] [phone]                 Next level of care provider has access to [place] and Suicide Prevention discussed: Yes,  with patient     Has patient been referred to the Quitline?: Patient refused referral  Patient has been referred for addiction treatment: Pt. refused referral  Vassie Moselle, LCSW 09/23/2021, 9:55 AM

## 2021-09-23 NOTE — BHH Group Notes (Signed)
Adult Psychoeducational Group Note  Date:  09/23/2021 Time:  8:55 AM  Group Topic/Focus:  Goals Group:   The focus of this group is to help patients establish daily goals to achieve during treatment and discuss how the patient can incorporate goal setting into their daily lives to aide in recovery.  Participation Level:  Active  Participation Quality:  Attentive  Affect:  Appropriate  Cognitive:  Confused  Insight: Lacking  Engagement in Group:  Lacking  Modes of Intervention:  Clarification and Discussion  [name] 09/23/2021, 8:55 AM

## 2021-09-23 NOTE — Progress Notes (Signed)
Recreation Therapy Notes  INPATIENT RECREATION TR PLAN  Patient Details Name: Mal[place] A Schleifer MRN: [id number] DOB: 1971-01-23 Today's Date: 09/23/2021  Rec Therapy Plan Is patient appropriate for Therapeutic Recreation?: Yes Treatment times per week: about 3 days Estimated Length of Stay: 5-7 days TR Treatment/Interventions: Group participation (Comment)  Discharge Criteria Pt will be discharged from therapy if:: Discharged Treatment plan/goals/alternatives discussed and agreed upon by:: Patient/family  Discharge Summary Short term goals set: See patient care plan Short term goals met: Complete Progress toward goals comments: Groups attended Which groups?: Coping skills, Other (Comment) Producer, television/film/video; Team Building) Reason goals not met: None Therapeutic equipment acquired: N/A Reason patient discharged from therapy: Discharge from hospital Pt/family agrees with progress & goals achieved: No Date patient discharged from therapy: 09/23/21    Victorino Sparrow, Vickki Muff, [place] A 09/23/2021, 12:42 PM

## 2021-09-24 ENCOUNTER — Telehealth (HOSPITAL_COMMUNITY): Payer: Self-pay

## 2021-09-24 NOTE — BH Assessment (Signed)
Care Management - Follow Up [facility] Discharges   Patient has been placed in an inpatient psychiatric hospital ([facility]) on 09-17-21

## 2021-09-28 ENCOUNTER — Emergency Department (HOSPITAL_BASED_OUTPATIENT_CLINIC_OR_DEPARTMENT_OTHER)
Admission: EM | Admit: 2021-09-28 | Discharge: 2021-09-28 | Discharge status: Against Medical Advice | Payer: Medicare Other | Attending: Emergency Medicine | Admitting: Emergency Medicine

## 2021-09-28 ENCOUNTER — Other Ambulatory Visit: Payer: Self-pay

## 2021-09-28 ENCOUNTER — Encounter (HOSPITAL_BASED_OUTPATIENT_CLINIC_OR_DEPARTMENT_OTHER): Payer: Self-pay

## 2021-09-28 DIAGNOSIS — Z5321 Procedure and treatment not carried out due to patient leaving prior to being seen by health care provider: Secondary | ICD-10-CM | POA: Insufficient documentation

## 2021-09-28 DIAGNOSIS — Z0189 Encounter for other specified special examinations: Secondary | ICD-10-CM | POA: Insufficient documentation

## 2021-09-28 LAB — CBC
HCT: 47.6 % (ref 39.0–52.0)
Hemoglobin: 15.8 g/dL (ref 13.0–17.0)
MCH: 28.7 pg (ref 26.0–34.0)
MCHC: 33.2 g/dL (ref 30.0–36.0)
MCV: 86.5 fL (ref 80.0–100.0)
Platelets: 225 10*3/uL (ref 150–400)
RBC: 5.5 MIL/uL (ref 4.22–5.81)
RDW: 12.3 % (ref 11.5–15.5)
WBC: 8.9 10*3/uL (ref 4.0–10.5)
nRBC: 0 % (ref 0.0–0.2)

## 2021-09-28 LAB — RAPID URINE DRUG SCREEN, HOSP PERFORMED
Amphetamines: NOT DETECTED
Barbiturates: NOT DETECTED
Benzodiazepines: NOT DETECTED
Cocaine: NOT DETECTED
Opiates: NOT DETECTED
Tetrahydrocannabinol: NOT DETECTED

## 2021-09-28 LAB — COMPREHENSIVE METABOLIC PANEL
ALT: 16 U/L (ref 0–44)
AST: 21 U/L (ref 15–41)
Albumin: 4.4 g/dL (ref 3.5–5.0)
Alkaline Phosphatase: 56 U/L (ref 38–126)
Anion gap: 9 (ref 5–15)
BUN: 13 mg/dL (ref 6–20)
CO2: 23 mmol/L (ref 22–32)
Calcium: 9.4 mg/dL (ref 8.9–10.3)
Chloride: 105 mmol/L (ref 98–111)
Creatinine, Ser: 0.85 mg/dL (ref 0.61–1.24)
GFR, Estimated: >60 mL/min (ref >60)
Glucose, Bld: 93 mg/dL (ref 70–99)
Potassium: 4.3 mmol/L (ref 3.5–5.1)
Sodium: 137 mmol/L (ref 135–145)
Total Bilirubin: 0.4 mg/dL (ref 0.3–1.2)
Total Protein: 7.4 g/dL (ref 6.5–8.1)

## 2021-09-28 LAB — ETHANOL: Alcohol, Ethyl (B): <10 mg/dL (ref <10)

## 2021-09-28 LAB — SALICYLATE LEVEL: Salicylate Lvl: <7 mg/dL — ABNORMAL LOW (ref 7.0–30.0)

## 2021-09-28 LAB — ACETAMINOPHEN LEVEL: Acetaminophen (Tylenol), Serum: <10 ug/mL — ABNORMAL LOW (ref 10–30)

## 2021-09-28 NOTE — ED Notes (Addendum)
Patient states he would like to leave the department prior to being evaluated by the provider. MD Janene Madeira made aware of Patient and their Status and endorses Patient may leave without Evaluation. Patient endorses no HI, no SI, no AVH. Patient is also alert and oriented and appears in NAD. Patient to be discharged accordingly.

## 2021-09-28 NOTE — ED Triage Notes (Signed)
Patient here POV from Home.  Patient endorses need to have "Blood checked for Cancer" and states to treat him as a "Poison Victim". Patient also states "I can't feel my intestines".  Patient does not endorse any Consumption of Harmful Substances, Alcohol, or Drugs.   NAD Noted during Triage. A&Ox4. GCS 15. Ambulatory. Patient appears Flat in Affect. Patient does not endorse SI, HI, or AVH.

## 2021-10-01 ENCOUNTER — Emergency Department (HOSPITAL_COMMUNITY)
Admission: EM | Admit: 2021-10-01 | Discharge: 2021-10-02 | Disposition: A | Discharge status: Home / Self Care | Payer: Medicare Other | Attending: Student | Admitting: Student

## 2021-10-01 DIAGNOSIS — C9590 Leukemia, unspecified not having achieved remission: Secondary | ICD-10-CM | POA: Diagnosis present

## 2021-10-01 DIAGNOSIS — Z5321 Procedure and treatment not carried out due to patient leaving prior to being seen by health care provider: Secondary | ICD-10-CM | POA: Diagnosis not present

## 2021-10-01 DIAGNOSIS — T65221A Toxic effect of tobacco cigarettes, accidental (unintentional), initial encounter: Secondary | ICD-10-CM | POA: Diagnosis not present

## 2021-10-01 DIAGNOSIS — Z20822 Contact with and (suspected) exposure to covid-19: Secondary | ICD-10-CM | POA: Insufficient documentation

## 2021-10-01 LAB — RESP PANEL BY RT-PCR (FLU A&B, COVID) ARPGX2
Influenza A by PCR: NEGATIVE
Influenza B by PCR: NEGATIVE
SARS Coronavirus 2 by RT PCR: NEGATIVE

## 2021-10-01 NOTE — ED Triage Notes (Signed)
Patient arrived with EMS from home reports " blood cancer and smoking poison ".

## 2021-10-01 NOTE — ED Provider Triage Note (Signed)
Emergency Medicine Provider Triage Evaluation Note  [name] , a 51 y.o. male  was evaluated in triage.  Pt complains of medical clearance. He notes he was "exposed to poison". Denies SI, HI, and auditory/visual hallucinations. Denies drug and alcohol use. History of schizoaffective disorder  Review of Systems  Positive: delusions Negative: HI  Physical Exam  BP 116/87 (BP Location: Right Arm)    Pulse (!) 59    Temp 98.1 F (36.7 C) (Oral)    Resp (!) 22    SpO2 97%  Gen:   Awake, no distress   Resp:  Normal effort  MSK:   Moves extremities without difficulty  Other:    Medical Decision Making  Medically screening exam initiated at 9:30 PM.  Appropriate orders placed.  [name] was informed that the remainder of the evaluation will be completed by another provider, this initial triage assessment does not replace that evaluation, and the importance of remaining in the ED until their evaluation is complete.  Medical clearance labs   Karie Kirks 10/01/21 2132

## 2021-10-09 ENCOUNTER — Emergency Department (HOSPITAL_COMMUNITY)
Admission: EM | Admit: 2021-10-09 | Discharge: 2021-10-10 | Disposition: A | Discharge status: Other Acute Inpatient Hospital | Payer: Medicare Other | Attending: Emergency Medicine | Admitting: Emergency Medicine

## 2021-10-09 ENCOUNTER — Encounter (HOSPITAL_COMMUNITY): Payer: Self-pay

## 2021-10-09 DIAGNOSIS — F600 Paranoid personality disorder: Secondary | ICD-10-CM | POA: Diagnosis not present

## 2021-10-09 DIAGNOSIS — Z20822 Contact with and (suspected) exposure to covid-19: Secondary | ICD-10-CM | POA: Diagnosis not present

## 2021-10-09 DIAGNOSIS — Z046 Encounter for general psychiatric examination, requested by authority: Secondary | ICD-10-CM | POA: Insufficient documentation

## 2021-10-09 DIAGNOSIS — R45851 Suicidal ideations: Secondary | ICD-10-CM | POA: Diagnosis not present

## 2021-10-09 DIAGNOSIS — F22 Delusional disorders: Secondary | ICD-10-CM | POA: Insufficient documentation

## 2021-10-09 DIAGNOSIS — F250 Schizoaffective disorder, bipolar type: Secondary | ICD-10-CM | POA: Diagnosis present

## 2021-10-09 DIAGNOSIS — R443 Hallucinations, unspecified: Secondary | ICD-10-CM

## 2021-10-09 LAB — CBC WITH DIFFERENTIAL/PLATELET
Abs Immature Granulocytes: 0.01 10*3/uL (ref 0.00–0.07)
Basophils Absolute: 0.1 10*3/uL (ref 0.0–0.1)
Basophils Relative: 1 %
Eosinophils Absolute: 0 10*3/uL (ref 0.0–0.5)
Eosinophils Relative: 0 %
HCT: 48.8 % (ref 39.0–52.0)
Hemoglobin: 16.3 g/dL (ref 13.0–17.0)
Immature Granulocytes: 0 %
Lymphocytes Relative: 26 %
Lymphs Abs: 2.4 10*3/uL (ref 0.7–4.0)
MCH: 29.1 pg (ref 26.0–34.0)
MCHC: 33.4 g/dL (ref 30.0–36.0)
MCV: 87.1 fL (ref 80.0–100.0)
Monocytes Absolute: 0.5 10*3/uL (ref 0.1–1.0)
Monocytes Relative: 6 %
Neutro Abs: 6.2 10*3/uL (ref 1.7–7.7)
Neutrophils Relative %: 67 %
Platelets: 237 10*3/uL (ref 150–400)
RBC: 5.6 MIL/uL (ref 4.22–5.81)
RDW: 12.3 % (ref 11.5–15.5)
WBC: 9.2 10*3/uL (ref 4.0–10.5)
nRBC: 0 % (ref 0.0–0.2)

## 2021-10-09 LAB — COMPREHENSIVE METABOLIC PANEL
ALT: 20 U/L (ref 0–44)
AST: 19 U/L (ref 15–41)
Albumin: 4.4 g/dL (ref 3.5–5.0)
Alkaline Phosphatase: 52 U/L (ref 38–126)
Anion gap: 7 (ref 5–15)
BUN: 10 mg/dL (ref 6–20)
CO2: 26 mmol/L (ref 22–32)
Calcium: 9.7 mg/dL (ref 8.9–10.3)
Chloride: 106 mmol/L (ref 98–111)
Creatinine, Ser: 1.11 mg/dL (ref 0.61–1.24)
GFR, Estimated: >60 mL/min (ref >60)
Glucose, Bld: 115 mg/dL — ABNORMAL HIGH (ref 70–99)
Potassium: 3.7 mmol/L (ref 3.5–5.1)
Sodium: 139 mmol/L (ref 135–145)
Total Bilirubin: 0.9 mg/dL (ref 0.3–1.2)
Total Protein: 7.5 g/dL (ref 6.5–8.1)

## 2021-10-09 LAB — ETHANOL: Alcohol, Ethyl (B): <10 mg/dL (ref <10)

## 2021-10-09 LAB — RESP PANEL BY RT-PCR (FLU A&B, COVID) ARPGX2
Influenza A by PCR: NEGATIVE
Influenza B by PCR: NEGATIVE
SARS Coronavirus 2 by RT PCR: NEGATIVE

## 2021-10-09 LAB — SALICYLATE LEVEL: Salicylate Lvl: <7 mg/dL — ABNORMAL LOW (ref 7.0–30.0)

## 2021-10-09 LAB — ACETAMINOPHEN LEVEL: Acetaminophen (Tylenol), Serum: <10 ug/mL — ABNORMAL LOW (ref 10–30)

## 2021-10-09 NOTE — ED Notes (Addendum)
Pt was changed into hospital scrubs. All pt belongings were placed into x3 pt belonging bags and labeled. Gray wristband placed on pt. Pt was informed of [facility]BH policy. Pt was informed that his belongings were going to be secured until discharged and placed into secure holding. Pt currently calm and cooperative with staff with no signs of aggression. Pt was wanded by security with no detected metal.

## 2021-10-09 NOTE — ED Provider Notes (Signed)
[facility] DEPT Provider Note   CSN: [id number] Arrival date & time: 10/09/21  1759     History  Chief Complaint  Patient presents with   IVC   Suicidal    [name] is a 51 y.o. male history of schizophrenia states he is not taking his medication.  He arrives with IVC paperwork with [employer] taken out by mother.  Patient apparently having audio and visual hallucinations.  He is perseverant that someone has "cut into his head and that is why my mom gets more money for me."  He denies any headache.  He was arrested yesterday for erratic behavior.  He has been threatening family that he would kill himself with a gun or used a knife to slice his head off.  Patient denies any overt SI or HI however then makes a gesture as if he is going to slit his throat.  He has erratic behavior however is cooperative.  Perseverant on his neighbors doing methamphetamines which is "causing me to go crazy."  He denies any illicit substance use himself.  Does state that his medications "go missing" and therefore he cannot take them.  HPI     Home Medications Prior to Admission medications   Medication Sig Start Date End Date Taking? Authorizing Provider  albuterol (VENTOLIN HFA) 108 (90 Base) MCG/ACT inhaler Inhale 2 puffs into the lungs every 4 (four) hours as needed for wheezing or shortness of breath. Patient taking differently: Inhale 2 puffs into the lungs every 4 (four) hours as needed for wheezing or shortness of breath. LF 07/06/21 09/02/19   Varney Biles, MD  chlorproMAZINE (THORAZINE) 100 MG tablet Take 1 tablet (100 mg total) by mouth at bedtime. 09/23/21   Freida Busman, MD  chlorproMAZINE (THORAZINE) 50 MG tablet Take 1 tablet (50 mg total) by mouth in the morning. 09/24/21   Freida Busman, MD      Allergies    Amoxicillin, Dextromethorphan-guaifenesin, Haloperidol, Haloperidol lactate, Meloxicam, Nsaids, Quetiapine, Sudafed [pseudoephedrine hcl], Ziprasidone,  and Prednisone    Review of Systems   Review of Systems  Unable to perform ROS: Psychiatric disorder   Physical Exam Updated Vital Signs BP (!) 141/87 (BP Location: Left Arm)    Pulse 85    Temp 98 F (36.7 C) (Oral)    Resp 20    SpO2 100%  Physical Exam Vitals and nursing note reviewed.  Constitutional:      General: He is not in acute distress.    Appearance: He is well-developed. He is not ill-appearing or diaphoretic.  HENT:     Head: Normocephalic and atraumatic.  Eyes:     Pupils: Pupils are equal, round, and reactive to light.  Cardiovascular:     Rate and Rhythm: Normal rate and regular rhythm.  Pulmonary:     Effort: Pulmonary effort is normal. No respiratory distress.  Abdominal:     General: There is no distension.     Palpations: Abdomen is soft.  Musculoskeletal:        General: Normal range of motion.     Cervical back: Normal range of motion and neck supple.     Comments: Moves all extremities without difficulty  Skin:    General: Skin is warm and dry.  Neurological:     General: No focal deficit present.     Mental Status: He is alert.     Comments: Ambulatory, moves all extremities, no obvious deficits  Psychiatric:  Attention and Perception: He is inattentive. He perceives auditory and visual hallucinations.        Mood and Affect: Affect is labile.        Speech: Speech is tangential.        Thought Content: Thought content is paranoid and delusional. Thought content includes suicidal ideation. Thought content does not include homicidal ideation. Thought content includes suicidal plan. Thought content does not include homicidal plan.     Comments: Appears to be responding to internal stimuli.  Labile affect, perseverant about "slicing my head off."  As well as speaking about neighbors doing methamphetamines and the cops "being after me."     ED Results / Procedures / Treatments   Labs (all labs ordered are listed, but only abnormal results are  displayed) Labs Reviewed  COMPREHENSIVE METABOLIC PANEL - Abnormal; Notable for the following components:      Result Value   Glucose, Bld 115 (*)    All other components within normal limits  SALICYLATE LEVEL - Abnormal; Notable for the following components:   Salicylate Lvl <3.6 (*)    All other components within normal limits  ACETAMINOPHEN LEVEL - Abnormal; Notable for the following components:   Acetaminophen (Tylenol), Serum <10 (*)    All other components within normal limits  RESP PANEL BY RT-PCR (FLU A&B, COVID) ARPGX2  ETHANOL  CBC WITH DIFFERENTIAL/PLATELET  RAPID URINE DRUG SCREEN, HOSP PERFORMED    EKG None  Radiology No results found.  Procedures Procedures    Medications Ordered in ED Medications - No data to display  ED Course/ Medical Decision Making/ A&P   51 year old with history of psychiatric disorder here for evaluation under IVC with [employer] taken out by family for audio and visual hallucinations, medication noncompliance as well as suicidal thoughts with a plan to shoot himself or use a knife to "slice my head off."  Very perseverant and tangential with thought process.  Does appear to be responding to internal stimuli.  He has a nonfocal neuro exam without deficits.  No infectious symptoms.  Will medically clear and have follow-up with psychiatry.  Labs personally reviewed and interpreted.  No significant abnormality.  Patient medically cleared.  Patient currently under IVC.  Will consult psychiatry for disposition                             Medical Decision Making Amount and/or Complexity of Data Reviewed External Data Reviewed: labs, radiology and notes. Labs: ordered. Decision-making details documented in ED Course.  Risk OTC drugs. Prescription drug management. Diagnosis or treatment significantly limited by social determinants of health.         Final Clinical Impression(s) / ED Diagnoses Final diagnoses:  Suicidal ideation   Hallucinations    Rx / DC Orders ED Discharge Orders     None         Abbie Berling A, PA-C 10/09/21 2056    Drenda Freeze, MD 10/09/21 2241

## 2021-10-09 NOTE — ED Notes (Signed)
Pt refused covid swab, will reevaluate and try again.

## 2021-10-09 NOTE — ED Notes (Signed)
Pt aware urine sample is needed 

## 2021-10-09 NOTE — ED Triage Notes (Addendum)
Pt arrived via police, IVC by mother. Per IVC paperwork pt has not been eating/drinking or taking prescribed meds. Pt stated to family "I wish I had a shot gun to shoot myself or behead myself"

## 2021-10-10 MED ORDER — CHLORPROMAZINE HCL 25 MG PO TABS: 50.0000 mg | ORAL_TABLET | Freq: Every morning | ORAL | Status: DC

## 2021-10-10 MED ORDER — ALPRAZOLAM 0.5 MG PO TABS: 1.0000 mg | ORAL_TABLET | Freq: Three times a day (TID) | ORAL | Status: DC | PRN

## 2021-10-10 MED ORDER — CHLORPROMAZINE HCL 100 MG PO TABS: 100.0000 mg | ORAL_TABLET | Freq: Every day | ORAL | Status: DC

## 2021-10-10 NOTE — BH Assessment (Signed)
[name] Assessment Progress Note   Per Sheran Fava, NP, this pt requires psychiatric hospitalization at this time.  Pt presents under IVC.  At 15:40 [name] calls from [facility] to report that pt has been accepted to their facility by Dr Nathaneil Canary.  He will go to the Fifth Floor of their [facility].  Farris Has concurs with this decision.  EDP Daleen Bo, MD and pt's nurse, Jarrett Soho, have been notified, and Jarrett Soho agrees to call report to [phone] before pt is transported.  Pt is to be transported via [employer].  [place] Coordinator [phone]

## 2021-10-10 NOTE — ED Notes (Addendum)
Patient moved to [place] with the assistance of security.  Moved 2 patient belongings with patient and placed in the cabinet side of 5-8. Patient was wanded by security again.

## 2021-10-10 NOTE — ED Notes (Signed)
Tech and Nurse reminded pt multiple times that we need a urine sample. Pt refused and "stated he wanted to see a medical advisory".

## 2021-10-10 NOTE — BH Assessment (Addendum)
[facility] Assessment Progress Note   Per Sheran Fava, NP , this pt requires psychiatric hospitalization at this time.  Pt presents under IVC initiated by pt's mother and upheld by EDP Shirlyn Goltz, MD.  [facility] will not be able to accommodate this pt at this time.  At the direction of Hampton Abbot, MD this writer has sought placement for this pt at facilities outside of the [facility] system.  The following facilities have been contacted to seek placement for this pt, with results as noted:  Beds available, information sent, decision pending: [facility]  Unable to reach: Autumn Patty (left message at 15:35)  At capacity: [facility] (recommend trying tomorrow) Raelyn Mora, [place] Coordinator [phone]
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
## 2021-10-10 NOTE — ED Provider Notes (Signed)
TTS consult is appreciated.  Patient will need inpatient psychiatric care.  TTS is working on appropriate placement.   Delora Fuel, MD 83/50/75 [phone]

## 2021-10-10 NOTE — ED Notes (Signed)
2 patient belonging bags and IVC paperwork sent with patient (accompanied by [employer]) to [facility]. Patient ambulatory to squad car with PD.

## 2021-10-19 ENCOUNTER — Other Ambulatory Visit: Payer: Self-pay

## 2021-10-19 ENCOUNTER — Emergency Department (HOSPITAL_COMMUNITY)
Admission: EM | Admit: 2021-10-19 | Discharge: 2021-10-19 | Disposition: A | Discharge status: Home / Self Care | Payer: Medicare Other | Attending: Emergency Medicine | Admitting: Emergency Medicine

## 2021-10-19 DIAGNOSIS — R109 Unspecified abdominal pain: Secondary | ICD-10-CM | POA: Insufficient documentation
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
## 2021-10-19 NOTE — ED Notes (Signed)
Patient tolerating PO intake

## 2021-10-19 NOTE — Discharge Instructions (Addendum)
You were seen today for abdominal pain.  I recommend continuing to drink water.  If you continue to have mild symptoms you can follow-up with your primary care provider.  Return to the emergency room if you develop life-threatening conditions such as severe abdominal pain, inability to tolerate oral liquids, chest pain, or shortness of breath.

## 2021-10-19 NOTE — ED Provider Notes (Signed)
[facility] EMERGENCY DEPARTMENT Provider Note   CSN: [id number] Arrival date & time: 10/19/21  2151     History  Chief Complaint  Patient presents with   Abdominal Pain    Pt has abdominal pain and is worried he was poisoned after eating doughnuts and drinking water    [name] is a 51 y.o. male.  The patient was brought in by EMS with concerns that he may have eaten "bad doughnuts."  He says that he was making himself vomit by using his finger.  The patient has past medical history significant for schizoaffective disorder, bipolar type, benzodiazepine dependence, paranoid schizophrenia, ID, drug overdose, intentional self-harm, polysubstance overdose, major depressive disorder.  HPI     Home Medications Prior to Admission medications   Medication Sig Start Date End Date Taking? Authorizing Provider  albuterol (VENTOLIN HFA) 108 (90 Base) MCG/ACT inhaler Inhale 2 puffs into the lungs every 4 (four) hours as needed for wheezing or shortness of breath. Patient taking differently: Inhale 2 puffs into the lungs every 4 (four) hours as needed for wheezing or shortness of breath. LF 07/06/21 09/02/19   Varney Biles, MD  ALPRAZolam [name]) 1 MG tablet Take 1 mg by mouth 3 (three) times daily as needed for anxiety.    [provider]  chlorproMAZINE (THORAZINE) 100 MG tablet Take 1 tablet (100 mg total) by mouth at bedtime. 09/23/21   Freida Busman, MD  chlorproMAZINE (THORAZINE) 50 MG tablet Take 1 tablet (50 mg total) by mouth in the morning. 09/24/21   Freida Busman, MD  paliperidone (INVEGA SUSTENNA) 156 MG/ML SUSY injection Inject 156 mg into the muscle once.    [provider]      Allergies    Amoxicillin, Dextromethorphan-guaifenesin, Haloperidol, Haloperidol lactate, Meloxicam, Nsaids, Quetiapine, Sudafed [pseudoephedrine hcl], Ziprasidone, and Prednisone    Review of Systems   Review of Systems  Constitutional:  Negative for fever.   Respiratory:  Negative for shortness of breath.   Cardiovascular:  Negative for chest pain.  Gastrointestinal:  Positive for vomiting (Vomiting after self-stimulating gag reflex). Negative for abdominal pain and nausea.  Psychiatric/Behavioral:  The patient is nervous/anxious.    Physical Exam Updated Vital Signs BP 136/78    Pulse 90    Temp 98 F (36.7 C) (Oral)    Resp 18    SpO2 99%  Physical Exam Constitutional:      General: He is not in acute distress. HENT:     Head: Normocephalic and atraumatic.  Eyes:     Pupils: Pupils are equal, round, and reactive to light.  Cardiovascular:     Rate and Rhythm: Normal rate and regular rhythm.     Heart sounds: Normal heart sounds.  Pulmonary:     Effort: Pulmonary effort is normal. No respiratory distress.     Breath sounds: Normal breath sounds.  Abdominal:     General: Abdomen is flat. Bowel sounds are normal. There is no distension.     Palpations: Abdomen is soft.     Tenderness: There is no abdominal tenderness.  Skin:    General: Skin is warm and dry.  Neurological:     Mental Status: He is alert.    ED Results / Procedures / Treatments   Labs (all labs ordered are listed, but only abnormal results are displayed) Labs Reviewed - No data to display  EKG None  Radiology No results found.  Procedures Procedures    Medications Ordered in  ED Medications - No data to display  ED Course/ Medical Decision Making/ A&P                           Medical Decision Making  This patient presents to the ED for concern of eating "tainted" doughnuts.  The patient has an extensive psychiatric history. He has only vomited by self-inducing his gag reflex. His vitals are all within normal limits. He has no abdominal pain upon reassessment. I see no reason at this time for lab work or imaging. The patient is able to comfortably sip water with no distress. He appears to be resting comfortably on the bed. I believe the patient is  safe and stable for discharge. Return precautions provided  Final Clinical Impression(s) / ED Diagnoses Final diagnoses:  Abdominal pain, unspecified abdominal location    Rx / DC Orders ED Discharge Orders     None         Dorothyann Peng, [place] 10/19/21 2312    Noemi Chapel, MD 10/20/21 1945

## 2021-10-29 ENCOUNTER — Encounter (HOSPITAL_COMMUNITY): Payer: Self-pay | Admitting: Emergency Medicine

## 2021-10-29 ENCOUNTER — Emergency Department (HOSPITAL_COMMUNITY)
Admission: EM | Admit: 2021-10-29 | Discharge: 2021-10-30 | Disposition: A | Discharge status: Home / Self Care | Payer: Medicare Other | Attending: Emergency Medicine | Admitting: Emergency Medicine

## 2021-10-29 ENCOUNTER — Other Ambulatory Visit: Payer: Self-pay

## 2021-10-29 DIAGNOSIS — M545 Low back pain, unspecified: Secondary | ICD-10-CM | POA: Insufficient documentation

## 2021-10-29 DIAGNOSIS — M542 Cervicalgia: Secondary | ICD-10-CM | POA: Insufficient documentation

## 2021-10-29 DIAGNOSIS — Z5321 Procedure and treatment not carried out due to patient leaving prior to being seen by health care provider: Secondary | ICD-10-CM | POA: Diagnosis not present

## 2021-10-29 NOTE — ED Triage Notes (Signed)
Patient reports chronic left upper back pain for several months , pain radiating to left shoulder/left neck , denies injury . No fever or chills . Respirations unlabored.

## 2021-10-29 NOTE — ED Notes (Signed)
Pt stated he is going home

## 2021-10-29 NOTE — ED Provider Triage Note (Signed)
Emergency Medicine Provider Triage Evaluation Note  [name] , a 51 y.o. male  was evaluated in triage.  Pt complains of neck and back pain.  Patient states that he has had the symptoms for multiple years.  When asked if anything acutely changed bring him to the emergency department, he was not able to articulate this.  He continues to state that he needs to lay backwards and land of bed.  He pointed to his pain and he pointed to the left side of his lateral neck as well as the left side of his lower back.  He denies any urinary incontinence, numbness, saddle anesthesia, weakness of his extremities.  Review of Systems  Positive:  Negative:   Physical Exam  BP 119/89 (BP Location: Right Arm)    Pulse (!) 111    Temp 98.8 F (37.1 C) (Oral)    Resp 20    SpO2 98%  Gen:   Awake, no distress   Resp:  Normal effort  MSK:   Moves extremities without difficulty  Other:  There is no C, T, or L-spine tenderness to palpation.  Paraspinal manipulation was not notable for reproducible pain.  Radial pulses are 2+ bilaterally.  Medical Decision Making  Medically screening exam initiated at 8:51 PM.  Appropriate orders placed.  [name] Gomer was informed that the remainder of the evaluation will be completed by another provider, this initial triage assessment does not replace that evaluation, and the importance of remaining in the ED until their evaluation is complete.  Patient is tachycardic to 111, however all other vitals are stable.  On quick chart review, patient has frequently seen in this ED.   Adolphus Birchwood, [place] 10/29/21 2055

## 2021-11-13 ENCOUNTER — Telehealth (HOSPITAL_COMMUNITY): Payer: Self-pay | Admitting: *Deleted

## 2021-11-13 NOTE — Telephone Encounter (Signed)
Patient called yesterday stating he needed invega injection 11/11/21 after discharging from hospital. Pt mentioned having an appointment for ACTT at [facility] on 11/27/21.   As courtesy for pt, I called [facility] and was told he had not followed up with them to schedule and he did not have an appointment.   Scheduled appointment for pt at [facility] on 11/20/21 at 11:00 am, and confirmed they will be able to provide medication management injections. I called [name] and gave him appt date, time, location. Pt was thankful and without complaint.

## 2022-04-20 ENCOUNTER — Emergency Department (HOSPITAL_COMMUNITY)
Admission: EM | Admit: 2022-04-20 | Discharge: 2022-04-20 | Discharge status: Against Medical Advice | Payer: 59 | Attending: Emergency Medicine | Admitting: Emergency Medicine

## 2022-04-20 ENCOUNTER — Encounter (HOSPITAL_COMMUNITY): Payer: Self-pay | Admitting: Emergency Medicine

## 2022-04-20 DIAGNOSIS — F419 Anxiety disorder, unspecified: Secondary | ICD-10-CM | POA: Diagnosis not present

## 2022-04-20 DIAGNOSIS — Z5321 Procedure and treatment not carried out due to patient leaving prior to being seen by health care provider: Secondary | ICD-10-CM | POA: Insufficient documentation

## 2022-04-20 DIAGNOSIS — M549 Dorsalgia, unspecified: Secondary | ICD-10-CM | POA: Diagnosis not present

## 2022-04-20 NOTE — ED Triage Notes (Addendum)
Patient BIB [facility] from home for anxiety and back pain. Patient states he has prescription for '1mg'$  alprazolam at home but states it makes him feel too sleepy. Patient is alert, oriented, and in no apparent distress at this time.

## 2022-04-20 NOTE — ED Notes (Signed)
Pt left AMA due to long wait times.

## 2022-06-17 ENCOUNTER — Other Ambulatory Visit: Payer: Self-pay

## 2022-06-17 ENCOUNTER — Encounter (HOSPITAL_COMMUNITY): Payer: Self-pay | Admitting: *Deleted

## 2022-06-17 ENCOUNTER — Emergency Department (HOSPITAL_COMMUNITY)
Admission: EM | Admit: 2022-06-17 | Discharge: 2022-06-19 | Disposition: A | Discharge status: Other Acute Inpatient Hospital | Payer: 59 | Attending: Emergency Medicine | Admitting: Emergency Medicine

## 2022-06-17 DIAGNOSIS — Y900 Blood alcohol level of less than 20 mg/100 ml: Secondary | ICD-10-CM | POA: Diagnosis not present

## 2022-06-17 DIAGNOSIS — F200 Paranoid schizophrenia: Secondary | ICD-10-CM | POA: Diagnosis present

## 2022-06-17 DIAGNOSIS — Z20822 Contact with and (suspected) exposure to covid-19: Secondary | ICD-10-CM | POA: Diagnosis not present

## 2022-06-17 DIAGNOSIS — F22 Delusional disorders: Secondary | ICD-10-CM

## 2022-06-17 DIAGNOSIS — Z046 Encounter for general psychiatric examination, requested by authority: Secondary | ICD-10-CM | POA: Diagnosis present

## 2022-06-17 DIAGNOSIS — F1320 Sedative, hypnotic or anxiolytic dependence, uncomplicated: Secondary | ICD-10-CM | POA: Diagnosis not present

## 2022-06-17 LAB — CBC WITH DIFFERENTIAL/PLATELET
Abs Immature Granulocytes: 0.02 10*3/uL (ref 0.00–0.07)
Basophils Absolute: 0.1 10*3/uL (ref 0.0–0.1)
Basophils Relative: 1 %
Eosinophils Absolute: 0.1 10*3/uL (ref 0.0–0.5)
Eosinophils Relative: 1 %
HCT: 43.6 % (ref 39.0–52.0)
Hemoglobin: 14.9 g/dL (ref 13.0–17.0)
Immature Granulocytes: 0 %
Lymphocytes Relative: 28 %
Lymphs Abs: 2.5 10*3/uL (ref 0.7–4.0)
MCH: 30.5 pg (ref 26.0–34.0)
MCHC: 34.2 g/dL (ref 30.0–36.0)
MCV: 89.2 fL (ref 80.0–100.0)
Monocytes Absolute: 0.7 10*3/uL (ref 0.1–1.0)
Monocytes Relative: 8 %
Neutro Abs: 5.6 10*3/uL (ref 1.7–7.7)
Neutrophils Relative %: 62 %
Platelets: 212 10*3/uL (ref 150–400)
RBC: 4.89 MIL/uL (ref 4.22–5.81)
RDW: 12 % (ref 11.5–15.5)
WBC: 8.9 10*3/uL (ref 4.0–10.5)
nRBC: 0 % (ref 0.0–0.2)

## 2022-06-17 LAB — COMPREHENSIVE METABOLIC PANEL
ALT: 25 U/L (ref 0–44)
AST: 26 U/L (ref 15–41)
Albumin: 4 g/dL (ref 3.5–5.0)
Alkaline Phosphatase: 44 U/L (ref 38–126)
Anion gap: 10 (ref 5–15)
BUN: 9 mg/dL (ref 6–20)
CO2: 25 mmol/L (ref 22–32)
Calcium: 9.6 mg/dL (ref 8.9–10.3)
Chloride: 107 mmol/L (ref 98–111)
Creatinine, Ser: 0.89 mg/dL (ref 0.61–1.24)
GFR, Estimated: >60 mL/min (ref >60)
Glucose, Bld: 102 mg/dL — ABNORMAL HIGH (ref 70–99)
Potassium: 3.4 mmol/L — ABNORMAL LOW (ref 3.5–5.1)
Sodium: 142 mmol/L (ref 135–145)
Total Bilirubin: 0.8 mg/dL (ref 0.3–1.2)
Total Protein: 6.6 g/dL (ref 6.5–8.1)

## 2022-06-17 LAB — SALICYLATE LEVEL: Salicylate Lvl: <7 mg/dL — ABNORMAL LOW (ref 7.0–30.0)

## 2022-06-17 LAB — ETHANOL: Alcohol, Ethyl (B): <10 mg/dL (ref <10)

## 2022-06-17 LAB — ACETAMINOPHEN LEVEL: Acetaminophen (Tylenol), Serum: <10 ug/mL — ABNORMAL LOW (ref 10–30)

## 2022-06-17 MED ORDER — CHLORPROMAZINE HCL 25 MG PO TABS: 50.0000 mg | ORAL_TABLET | Freq: Every morning | ORAL | Status: DC

## 2022-06-17 MED ORDER — CHLORPROMAZINE HCL 25 MG PO TABS: 100.0000 mg | ORAL_TABLET | Freq: Every day | ORAL | Status: DC

## 2022-06-17 MED ORDER — PALIPERIDONE ER 6 MG PO TB24
12.0000 mg | ORAL_TABLET | Freq: Every day | ORAL | Status: DC
  Administered 2022-06-18: 12 mg via ORAL
  Filled 2022-06-17 (×4): qty 2

## 2022-06-17 MED ORDER — ALPRAZOLAM 0.5 MG PO TABS: 1.0000 mg | ORAL_TABLET | Freq: Three times a day (TID) | ORAL | Status: DC | PRN
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
## 2022-06-17 NOTE — ED Notes (Signed)
IVC PAPERWORK IS COMPLETE
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
## 2022-06-18 DIAGNOSIS — F1320 Sedative, hypnotic or anxiolytic dependence, uncomplicated: Secondary | ICD-10-CM | POA: Diagnosis not present

## 2022-06-18 DIAGNOSIS — F200 Paranoid schizophrenia: Secondary | ICD-10-CM

## 2022-06-18 LAB — RAPID URINE DRUG SCREEN, HOSP PERFORMED
Amphetamines: NOT DETECTED
Barbiturates: NOT DETECTED
Benzodiazepines: NOT DETECTED
Cocaine: NOT DETECTED
Opiates: POSITIVE — AB
Tetrahydrocannabinol: NOT DETECTED

## 2022-06-18 LAB — RESP PANEL BY RT-PCR (FLU A&B, COVID) ARPGX2
Influenza A by PCR: NEGATIVE
Influenza B by PCR: NEGATIVE
SARS Coronavirus 2 by RT PCR: NEGATIVE

## 2022-06-18 MED ORDER — PALIPERIDONE ER 6 MG PO TB24
12.0000 mg | ORAL_TABLET | Freq: Every day | ORAL | Status: DC
  Filled 2022-06-18: qty 2

## 2022-06-18 MED ORDER — HYDROCODONE-ACETAMINOPHEN 5-325 MG PO TABS
1.0000 | ORAL_TABLET | Freq: Once | ORAL | Status: AC
  Administered 2022-06-18: 1 via ORAL
  Filled 2022-06-18: qty 1

## 2022-06-18 NOTE — ED Notes (Signed)
Dinner tray at bedside; pt unsatisfied with dinner tray provided, states "it's dog food"; assured pt that meal provided is not "dog food"; this RN offered to bring pt [place] sandwich and/or peanut butter and crackers; pt declined

## 2022-06-18 NOTE — Progress Notes (Signed)
Patient has been denied by [facility] due to no appropriate beds available. Patient meets [facility] inpatient criteria per Lavell Anchors, FNP. Patient has been faxed out to the following facilities:    [facility]  [place], [place] [address] [place]  [facility]  [address], [place] [address] [phone] [place]  [address], [place] [address] [phone]  [place]  [address] [place] [address] [phone]  [place]  [address] [place] [address] [phone]  [facility]  [place], [place] [address] [place] [place], [place] [address] [phone]  [facility]  [address] [place] [address] [phone] [facility]  [address] [place] [address] [place]  [facility]  [address] [place] [address] [phone]  [facility]  [address], [place] [address] [phone] [place], MSW, LCSW-A  10:44 PM 06/18/2022

## 2022-06-18 NOTE — ED Notes (Signed)
Review of patient clipboard documentation in [place] indicates IVC dated 06/17/22, expiration 06/24/22.

## 2022-06-18 NOTE — ED Notes (Signed)
Pt resting with eyes closed; respirations spontaneous, even, unlabored; sitter at bedside

## 2022-06-18 NOTE — ED Notes (Signed)
Patient came to bridge and made a call to his mother. Patient began escalating in his tone and verbage, was angry with his mother for his being here, and told her she belonged in a dog house before he hung up. Reported issue to RN as it transpired and she also witnessed patient.

## 2022-06-18 NOTE — ED Provider Notes (Signed)
Emergency Medicine Observation Re-evaluation Note  [name] is a 51 y.o. male, seen on rounds today.  Pt initially presented to the ED for complaints of Psychiatric Evaluation Currently, the patient is calm and corporative. Resting.  Physical Exam  BP (!) 91/59 (BP Location: Right Arm)   Pulse (!) 52   Temp 98 F (36.7 C) (Oral)   Resp 17   SpO2 97%  Physical Exam General: calm and corporative Cardiac: stable rate Lungs: no respiratory distress Psych: paranoid, pressured speech  ED Course / MDM  EKG:   I have reviewed the labs performed to date as well as medications administered while in observation.  Recent changes in the last 24 hours include seen by [facility] and recommended for inpatient placement. Waiting for bed.  Plan  Current plan is for seen by [facility] and recommended for inpatient placement. Waiting for bed.Pearline Cables, Delora Fuel, DO 45/36/46 1430
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
## 2022-06-18 NOTE — ED Notes (Signed)
IVC paperwork is in [facility] nurses station

## 2022-06-18 NOTE — ED Notes (Signed)
Pt requesting pain meds (chronic back pain); EDP notified

## 2022-06-18 NOTE — Progress Notes (Signed)
[facility] LCSW Progress Note   06/18/2022    11:18 PM  [name]   [id number]   Type of Contact and Topic:  Psychiatric Bed Placement   Pt accepted to [facility]     Patient meets inpatient criteria per Lavell Anchors, FNP  The attending provider will be Jonelle Sports, MD   Call report to [phone]  [name], RN @ [facility] ED notified.     Pt scheduled  to arrive at [place] 0900.   Mariea Clonts, MSW, LCSW-A  11:19 PM 06/18/2022

## 2022-06-19 NOTE — ED Notes (Signed)
Report called to [facility]

## 2022-06-19 NOTE — ED Provider Notes (Signed)
Emergency Medicine Observation Re-evaluation Note  [name] is a 51 y.o. male, seen on rounds today.  Pt initially presented to the ED for complaints of Psychiatric Evaluation Currently, the patient is eating breakfast.  Physical Exam  BP 106/69 (BP Location: Right Arm)   Pulse (!) 58   Temp 97.9 F (36.6 C)   Resp 18   SpO2 96%  Physical Exam General: Eating breakfast and calm Lungs: Breathing comfortably Psych: Calm and not agitated  ED Course / MDM  EKG:   I have reviewed the labs performed to date as well as medications administered while in observation.  Recent changes in the last 24 hours include observation, med reconciliation,  psychiatry for placement.  Plan  Current plan is for [facility] placement today.    Elgie Congo, MD 06/19/22 [phone]

## 2022-07-16 ENCOUNTER — Emergency Department (HOSPITAL_COMMUNITY): Payer: 59

## 2022-07-16 ENCOUNTER — Emergency Department (HOSPITAL_COMMUNITY)
Admission: EM | Admit: 2022-07-16 | Discharge: 2022-07-16 | Discharge status: Against Medical Advice | Payer: 59 | Source: Home / Self Care

## 2022-07-16 ENCOUNTER — Other Ambulatory Visit: Payer: Self-pay

## 2022-07-16 ENCOUNTER — Emergency Department (HOSPITAL_COMMUNITY)
Admission: EM | Admit: 2022-07-16 | Discharge: 2022-07-16 | Discharge status: Against Medical Advice | Payer: 59 | Attending: Emergency Medicine | Admitting: Emergency Medicine

## 2022-07-16 ENCOUNTER — Encounter (HOSPITAL_COMMUNITY): Payer: Self-pay

## 2022-07-16 ENCOUNTER — Emergency Department (HOSPITAL_COMMUNITY)
Admission: EM | Admit: 2022-07-16 | Discharge: 2022-07-17 | Discharge status: Against Medical Advice | Payer: 59 | Source: Home / Self Care

## 2022-07-16 DIAGNOSIS — Z5321 Procedure and treatment not carried out due to patient leaving prior to being seen by health care provider: Secondary | ICD-10-CM | POA: Insufficient documentation

## 2022-07-16 DIAGNOSIS — R079 Chest pain, unspecified: Secondary | ICD-10-CM | POA: Insufficient documentation

## 2022-07-16 DIAGNOSIS — R0602 Shortness of breath: Secondary | ICD-10-CM | POA: Insufficient documentation

## 2022-07-16 DIAGNOSIS — M79641 Pain in right hand: Secondary | ICD-10-CM | POA: Insufficient documentation

## 2022-07-16 MED ORDER — ACETAMINOPHEN 325 MG PO TABS: 650.0000 mg | ORAL_TABLET | Freq: Once | ORAL | Status: DC

## 2022-07-16 NOTE — ED Notes (Signed)
Pt no longer wanted to wait

## 2022-07-16 NOTE — ED Notes (Signed)
Pt ambulated out of ED at this time. States he was going to get some oxygen and leave. Encouraged patient to stay in ED for remainder of care. Pt denied. Walked out of ED at this time.
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
## 2022-07-16 NOTE — ED Triage Notes (Signed)
[place].  Was seen at [facility] earlier today and left AMA.  Complains of hungry thirst and weak.  Also reports he wants to take a shower.  12 lead NSR.  Hx of schizoaffective. Noncompliant with BH meds.

## 2022-07-16 NOTE — ED Provider Triage Note (Signed)
Emergency Medicine Provider Triage Evaluation Note  [name] , a 51 y.o. male  was evaluated in triage.  Pt complains of pain between his shoulder blades, reports he "I have a strong will to live ".  Patient was seen at [facility] long approximately 6 hours ago and eloped before treatment could be completed.  Patient was seen for right hand pain.  Not taking anything for improvement in symptoms.  No alleviating or exacerbating factors.  Review of Systems  Positive: Chest pain, back pain Negative: Fever, cough  Physical Exam  BP 113/80 (BP Location: Right Arm)   Pulse 93   Temp 98.4 F (36.9 C) (Oral)   Resp 18   Ht 6' (1.829 m)   Wt 82.6 kg   SpO2 99%   BMI 24.68 kg/m  Gen:   Awake, no distress   Resp:  Normal effort  MSK:   Moves extremities without difficulty  Other:    Medical Decision Making  Medically screening exam initiated at 6:21 PM.  Appropriate orders placed.  [name] was informed that the remainder of the evaluation will be completed by another provider, this initial triage assessment does not replace that evaluation, and the importance of remaining in the ED until their evaluation is complete.     Janeece Fitting, PA-C 07/16/22 1823

## 2022-07-16 NOTE — ED Triage Notes (Signed)
BIBA for right hand pain. Per ems patient using hand in ambulance. Full ROM noted.

## 2022-07-17 ENCOUNTER — Other Ambulatory Visit: Payer: Self-pay

## 2022-07-17 ENCOUNTER — Emergency Department (HOSPITAL_COMMUNITY): Payer: 59

## 2022-07-17 ENCOUNTER — Encounter (HOSPITAL_COMMUNITY): Payer: Self-pay | Admitting: Emergency Medicine

## 2022-07-17 DIAGNOSIS — M79641 Pain in right hand: Secondary | ICD-10-CM | POA: Diagnosis not present

## 2022-07-17 NOTE — ED Notes (Signed)
Pt not answering for vitals 2x

## 2022-07-17 NOTE — ED Triage Notes (Signed)
Patient reports pain across chest this week with mild SOB , denies chest pain at triage .

## 2022-07-17 NOTE — ED Notes (Signed)
PATIENT REFUSED LAB WORK X2

## 2022-07-17 NOTE — ED Provider Triage Note (Addendum)
Emergency Medicine Provider Triage Evaluation Note  [name] , a 51 y.o. male  was evaluated in triage.  Pt complains of chest pain, SOB.  States pain all across the upper chest.  Denies cough/fever.  Review of Systems  Positive: Chest pain, SOB Negative: fever  Physical Exam  BP 105/79 (BP Location: Right Arm)   Pulse 89   Temp 98.1 F (36.7 C)   Resp 16   SpO2 99%  Gen:   Awake, no distress   Resp:  Normal effort  MSK:   Moves extremities without difficulty  Other:    Medical Decision Making  Medically screening exam initiated at 12:06 AM.  Appropriate orders placed.  [name] was informed that the remainder of the evaluation will be completed by another provider, this initial triage assessment does not replace that evaluation, and the importance of remaining in the ED until their evaluation is complete.  Chest pain, SOB.  No cough/fever.  EKG, labs, CXR.  12:12 AM Refused labs in triage.   Larene Pickett, PA-C 07/17/22 0007    Larene Pickett, PA-C 07/17/22 [phone]

## 2022-07-17 NOTE — ED Notes (Signed)
Pt not answering for vitals 3x 

## 2022-08-05 ENCOUNTER — Emergency Department (HOSPITAL_COMMUNITY)
Admission: EM | Admit: 2022-08-05 | Discharge: 2022-08-06 | Disposition: A | Discharge status: Home / Self Care | Payer: 59 | Attending: Emergency Medicine | Admitting: Emergency Medicine

## 2022-08-05 ENCOUNTER — Other Ambulatory Visit: Payer: Self-pay

## 2022-08-05 ENCOUNTER — Encounter (HOSPITAL_COMMUNITY): Payer: Self-pay

## 2022-08-05 DIAGNOSIS — J45909 Unspecified asthma, uncomplicated: Secondary | ICD-10-CM | POA: Insufficient documentation

## 2022-08-05 DIAGNOSIS — J449 Chronic obstructive pulmonary disease, unspecified: Secondary | ICD-10-CM | POA: Diagnosis not present

## 2022-08-05 DIAGNOSIS — I509 Heart failure, unspecified: Secondary | ICD-10-CM | POA: Insufficient documentation

## 2022-08-05 DIAGNOSIS — F22 Delusional disorders: Secondary | ICD-10-CM | POA: Diagnosis present

## 2022-08-05 DIAGNOSIS — F200 Paranoid schizophrenia: Secondary | ICD-10-CM | POA: Insufficient documentation
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
## 2022-08-05 NOTE — ED Notes (Signed)
Pt refusing EKG, blood draws; EDP aware
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
## 2022-08-05 NOTE — ED Notes (Signed)
Pt refused labs, triage PA notified

## 2022-08-06 NOTE — ED Notes (Addendum)
Pt firmly refusing lab draw. Explained reason for labs, maintains that he refuses this. States that he hates needles and that he does not believe this RN that ordered test cannot be obtained from saliva test.  Denying SI/HI. MD notified. IVS not indicate at this time. Will reapproach patient after giving him time to rest

## 2022-08-06 NOTE — ED Notes (Signed)
Pt refuses vital signs statign that he doesn't agree with anything made in [place]. States he is ready to leave and does not want vital signs
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
## 2022-08-15 ENCOUNTER — Emergency Department (EMERGENCY_DEPARTMENT_HOSPITAL)
Admission: EM | Admit: 2022-08-15 | Discharge: 2022-08-19 | Disposition: A | Discharge status: Home / Self Care | Payer: 59 | Source: Home / Self Care | Attending: Emergency Medicine | Admitting: Emergency Medicine

## 2022-08-15 ENCOUNTER — Emergency Department (HOSPITAL_COMMUNITY)
Admission: EM | Admit: 2022-08-15 | Discharge: 2022-08-15 | Disposition: A | Discharge status: Home / Self Care | Payer: 59 | Attending: Emergency Medicine | Admitting: Emergency Medicine

## 2022-08-15 ENCOUNTER — Encounter (HOSPITAL_COMMUNITY): Payer: Self-pay

## 2022-08-15 ENCOUNTER — Other Ambulatory Visit: Payer: Self-pay

## 2022-08-15 DIAGNOSIS — J449 Chronic obstructive pulmonary disease, unspecified: Secondary | ICD-10-CM | POA: Insufficient documentation

## 2022-08-15 DIAGNOSIS — F250 Schizoaffective disorder, bipolar type: Secondary | ICD-10-CM | POA: Diagnosis present

## 2022-08-15 DIAGNOSIS — F1320 Sedative, hypnotic or anxiolytic dependence, uncomplicated: Secondary | ICD-10-CM | POA: Insufficient documentation

## 2022-08-15 DIAGNOSIS — Z046 Encounter for general psychiatric examination, requested by authority: Secondary | ICD-10-CM | POA: Insufficient documentation

## 2022-08-15 DIAGNOSIS — F201 Disorganized schizophrenia: Secondary | ICD-10-CM | POA: Insufficient documentation

## 2022-08-15 DIAGNOSIS — X088XXA Exposure to other specified smoke, fire and flames, initial encounter: Secondary | ICD-10-CM | POA: Insufficient documentation

## 2022-08-15 DIAGNOSIS — Z87891 Personal history of nicotine dependence: Secondary | ICD-10-CM | POA: Insufficient documentation

## 2022-08-15 DIAGNOSIS — F200 Paranoid schizophrenia: Secondary | ICD-10-CM | POA: Diagnosis not present

## 2022-08-15 DIAGNOSIS — T25222A Burn of second degree of left foot, initial encounter: Secondary | ICD-10-CM | POA: Insufficient documentation

## 2022-08-15 DIAGNOSIS — M546 Pain in thoracic spine: Secondary | ICD-10-CM | POA: Insufficient documentation

## 2022-08-15 DIAGNOSIS — Z5321 Procedure and treatment not carried out due to patient leaving prior to being seen by health care provider: Secondary | ICD-10-CM | POA: Insufficient documentation

## 2022-08-15 DIAGNOSIS — I509 Heart failure, unspecified: Secondary | ICD-10-CM | POA: Insufficient documentation

## 2022-08-15 DIAGNOSIS — M542 Cervicalgia: Secondary | ICD-10-CM | POA: Diagnosis not present

## 2022-08-15 LAB — CBC
HCT: 45.9 % (ref 39.0–52.0)
Hemoglobin: 15.5 g/dL (ref 13.0–17.0)
MCH: 29.7 pg (ref 26.0–34.0)
MCHC: 33.8 g/dL (ref 30.0–36.0)
MCV: 87.9 fL (ref 80.0–100.0)
Platelets: 195 10*3/uL (ref 150–400)
RBC: 5.22 MIL/uL (ref 4.22–5.81)
RDW: 11.9 % (ref 11.5–15.5)
WBC: 5.7 10*3/uL (ref 4.0–10.5)
nRBC: 0 % (ref 0.0–0.2)

## 2022-08-15 NOTE — ED Notes (Signed)
Staffing office called and will send sitter
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
## 2022-08-15 NOTE — ED Notes (Signed)
Pt belongings placed in locker 4, and have been inventoried. Security at the bedside to want the patient.  Other personal belongings such as wallet, cards, lottery ticket, cigarettes, and lighter were given to security.
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
## 2022-08-16 DIAGNOSIS — F200 Paranoid schizophrenia: Secondary | ICD-10-CM | POA: Diagnosis not present

## 2022-08-16 DIAGNOSIS — Z046 Encounter for general psychiatric examination, requested by authority: Secondary | ICD-10-CM | POA: Diagnosis not present

## 2022-08-16 LAB — COMPREHENSIVE METABOLIC PANEL
ALT: 20 U/L (ref 0–44)
ALT: 18 U/L (ref 0–44)
AST: 29 U/L (ref 15–41)
AST: 25 U/L (ref 15–41)
Albumin: 4.5 g/dL (ref 3.5–5.0)
Albumin: 3.8 g/dL (ref 3.5–5.0)
Alkaline Phosphatase: 50 U/L (ref 38–126)
Alkaline Phosphatase: 52 U/L (ref 38–126)
Anion gap: 11 (ref 5–15)
Anion gap: 11 (ref 5–15)
BUN: 9 mg/dL (ref 6–20)
BUN: 8 mg/dL (ref 6–20)
CO2: 29 mmol/L (ref 22–32)
CO2: 27 mmol/L (ref 22–32)
Calcium: 9.9 mg/dL (ref 8.9–10.3)
Calcium: 9.2 mg/dL (ref 8.9–10.3)
Chloride: 104 mmol/L (ref 98–111)
Chloride: 105 mmol/L (ref 98–111)
Creatinine, Ser: 0.95 mg/dL (ref 0.61–1.24)
Creatinine, Ser: 0.92 mg/dL (ref 0.61–1.24)
GFR, Estimated: >60 mL/min (ref >60)
GFR, Estimated: >60 mL/min (ref >60)
Glucose, Bld: 103 mg/dL — ABNORMAL HIGH (ref 70–99)
Glucose, Bld: 95 mg/dL (ref 70–99)
Potassium: 3.4 mmol/L — ABNORMAL LOW (ref 3.5–5.1)
Potassium: 3.1 mmol/L — ABNORMAL LOW (ref 3.5–5.1)
Sodium: 144 mmol/L (ref 135–145)
Sodium: 143 mmol/L (ref 135–145)
Total Bilirubin: 0.3 mg/dL (ref 0.3–1.2)
Total Bilirubin: 0.5 mg/dL (ref 0.3–1.2)
Total Protein: 7.8 g/dL (ref 6.5–8.1)
Total Protein: 6.1 g/dL — ABNORMAL LOW (ref 6.5–8.1)

## 2022-08-16 LAB — ETHANOL: Alcohol, Ethyl (B): <10 mg/dL (ref <10)

## 2022-08-16 LAB — ACETAMINOPHEN LEVEL: Acetaminophen (Tylenol), Serum: <10 ug/mL — ABNORMAL LOW (ref 10–30)

## 2022-08-16 LAB — SALICYLATE LEVEL: Salicylate Lvl: <7 mg/dL — ABNORMAL LOW (ref 7.0–30.0)

## 2022-08-16 MED ORDER — ALBUTEROL SULFATE (2.5 MG/3ML) 0.083% IN NEBU: 2.5000 mg | INHALATION_SOLUTION | Freq: Four times a day (QID) | RESPIRATORY_TRACT | Status: DC | PRN

## 2022-08-16 MED ORDER — POTASSIUM CHLORIDE CRYS ER 20 MEQ PO TBCR
40.0000 meq | EXTENDED_RELEASE_TABLET | Freq: Once | ORAL | Status: DC
  Filled 2022-08-16 (×2): qty 2

## 2022-08-16 MED ORDER — LACTATED RINGERS IV BOLUS: 1000.0000 mL | Freq: Once | INTRAVENOUS | Status: DC

## 2022-08-16 MED ORDER — ALBUTEROL SULFATE HFA 108 (90 BASE) MCG/ACT IN AERS: 2.0000 | INHALATION_SPRAY | RESPIRATORY_TRACT | Status: DC | PRN

## 2022-08-16 MED ORDER — PALIPERIDONE ER 6 MG PO TB24: 6.0000 mg | ORAL_TABLET | Freq: Every day | ORAL | Status: DC

## 2022-08-16 MED ORDER — PALIPERIDONE ER 6 MG PO TB24
6.0000 mg | ORAL_TABLET | Freq: Every day | ORAL | Status: DC
  Filled 2022-08-16: qty 1

## 2022-08-16 MED ORDER — ACETAMINOPHEN 325 MG PO TABS
650.0000 mg | ORAL_TABLET | Freq: Four times a day (QID) | ORAL | Status: DC | PRN
  Administered 2022-08-17: 650 mg via ORAL
  Filled 2022-08-16 (×2): qty 2

## 2022-08-16 MED ORDER — LORAZEPAM 1 MG PO TABS
1.0000 mg | ORAL_TABLET | Freq: Three times a day (TID) | ORAL | Status: DC | PRN
  Filled 2022-08-16 (×2): qty 1

## 2022-08-16 NOTE — ED Notes (Signed)
PT' mother called to wish him happy birthday.  She mentioned that he has been to [facility] several times but that this last time he came back worse than ever.  She wondered if he had a shock treatment while he was there.  She said he has not been right since and has lost about 20 lbs since Thanksgiving.  I am updating contact info if anyone wants to speak with her about the pt.

## 2022-08-16 NOTE — BH Assessment (Signed)
Clinician messaged [name] Benton, RN: "Hey. It's [name] with TTS. Is the pt able to engage in the assessment, if so the pt will need to be placed in a private room. Also is the pt medically cleared? Can you fax pt's IVC paperwork to [phone]."   Clinician awaiting response.    [name], [place], [facility], [facility] Triage Specialist [phone]

## 2022-08-16 NOTE — ED Notes (Signed)
Dr. Doren Custard notified of abnormal VS and low potassium.  DR would like to start fluids and give potassium but the pt is refusing at this time.  I have reached out to Psych NP to see if we plan any medicinal intervention that might encourage cooperation.
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
## 2022-08-16 NOTE — ED Notes (Signed)
Pt is refusing everything.  I took potassium, LR, pump and IV supplies in the room and begged and he will not allow himself to be stuck or take the potassium.  I mentioned the Invega and he said no to that too. EDP and Psych NP are aware.

## 2022-08-16 NOTE — Consult Note (Signed)
Reached out to nursing, requested to see patient via tts cart for psych assessment.  Per Oretha Caprice, RN, he's tied up at the moment but will alert this writer when patient is ready for assessment.

## 2022-08-16 NOTE — ED Notes (Signed)
Pt ate breakfast and is resting.

## 2022-08-16 NOTE — ED Notes (Signed)
Patient's mom called. Gave her an update on patient's care.

## 2022-08-16 NOTE — ED Notes (Signed)
Patient is currently refusing  to take PO medication. Patient states that the medications "will kill him". Patient began screaming, telling this nurse to back away. Patient became agitated and unwilling to take any medication. Agitation was redirected after this nurse left the room. Will advise the Charge RN of the situation.

## 2022-08-16 NOTE — ED Notes (Signed)
IVC paperwork complete and in orange zone, expires on 08/22/22

## 2022-08-16 NOTE — ED Notes (Signed)
Assumed care of patient. Patient is walking around cleaning his room and removing his food tray.
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
## 2022-08-16 NOTE — ED Provider Notes (Signed)
Emergency Medicine Observation Re-evaluation Note  Mali A Harty is a 51 y.o. male, seen on rounds today.  Pt initially presented to the ED for complaints of IVC Currently, the patient is resting in bed.  Physical Exam  BP 90/66   Pulse 81   Temp 98.1 F (36.7 C) (Oral)   Resp 16   SpO2 95%  Physical Exam General: Awake, alert, nondistressed Cardiac: Heart rate in the 50s, soft blood pressures Lungs: Breathing is unlabored, normal SpO2 on room air Psych: Slowed speech, endorses delusions  ED Course / MDM  EKG:   I have reviewed the labs performed to date as well as medications administered while in observation.  Recent changes in the last 24 hours include presentation to the ED yesterday, under IVC, for decompensated schizophrenia.  He has been medically cleared.  Currently awaiting TTS recommendations.  Plan  Current plan is for TTS evaluation.  Patient was noted to have soft blood pressures.  Although he states that he has been eating normally, he states that he has had ongoing weight loss.  Current oral temperature is 99.2 degrees.  Patient has no current physical complaints.  Nursing confirms that he has been eating here.  Bolus of IV fluids was ordered.    Godfrey Pick, MD 08/16/22 [phone]

## 2022-08-17 DIAGNOSIS — Z046 Encounter for general psychiatric examination, requested by authority: Secondary | ICD-10-CM | POA: Diagnosis not present

## 2022-08-17 LAB — CBC WITH DIFFERENTIAL/PLATELET
Abs Immature Granulocytes: 0.01 10*3/uL (ref 0.00–0.07)
Basophils Absolute: 0.1 10*3/uL (ref 0.0–0.1)
Basophils Relative: 1 %
Eosinophils Absolute: 0 10*3/uL (ref 0.0–0.5)
Eosinophils Relative: 1 %
HCT: 44.8 % (ref 39.0–52.0)
Hemoglobin: 15 g/dL (ref 13.0–17.0)
Immature Granulocytes: 0 %
Lymphocytes Relative: 31 %
Lymphs Abs: 1.8 10*3/uL (ref 0.7–4.0)
MCH: 29.6 pg (ref 26.0–34.0)
MCHC: 33.5 g/dL (ref 30.0–36.0)
MCV: 88.5 fL (ref 80.0–100.0)
Monocytes Absolute: 0.4 10*3/uL (ref 0.1–1.0)
Monocytes Relative: 7 %
Neutro Abs: 3.5 10*3/uL (ref 1.7–7.7)
Neutrophils Relative %: 60 %
Platelets: 158 10*3/uL (ref 150–400)
RBC: 5.06 MIL/uL (ref 4.22–5.81)
RDW: 12 % (ref 11.5–15.5)
WBC: 5.7 10*3/uL (ref 4.0–10.5)
nRBC: 0 % (ref 0.0–0.2)

## 2022-08-17 LAB — COMPREHENSIVE METABOLIC PANEL
ALT: 18 U/L (ref 0–44)
AST: 23 U/L (ref 15–41)
Albumin: 3.8 g/dL (ref 3.5–5.0)
Alkaline Phosphatase: 46 U/L (ref 38–126)
Anion gap: 8 (ref 5–15)
BUN: 7 mg/dL (ref 6–20)
CO2: 26 mmol/L (ref 22–32)
Calcium: 9.4 mg/dL (ref 8.9–10.3)
Chloride: 107 mmol/L (ref 98–111)
Creatinine, Ser: 0.91 mg/dL (ref 0.61–1.24)
GFR, Estimated: >60 mL/min (ref >60)
Glucose, Bld: 103 mg/dL — ABNORMAL HIGH (ref 70–99)
Potassium: 3.9 mmol/L (ref 3.5–5.1)
Sodium: 141 mmol/L (ref 135–145)
Total Bilirubin: 0.6 mg/dL (ref 0.3–1.2)
Total Protein: 6.2 g/dL — ABNORMAL LOW (ref 6.5–8.1)

## 2022-08-17 LAB — LACTIC ACID, PLASMA: Lactic Acid, Venous: 1.1 mmol/L (ref 0.5–1.9)

## 2022-08-17 LAB — MAGNESIUM: Magnesium: 2.6 mg/dL — ABNORMAL HIGH (ref 1.7–2.4)

## 2022-08-17 MED ORDER — OLANZAPINE 5 MG PO TBDP
10.0000 mg | ORAL_TABLET | Freq: Every day | ORAL | Status: DC
  Administered 2022-08-17 – 2022-08-18 (×2): 10 mg via ORAL
  Filled 2022-08-17 (×2): qty 2

## 2022-08-17 MED ORDER — OLANZAPINE 10 MG IM SOLR: 10.0000 mg | Freq: Every day | INTRAMUSCULAR | Status: DC

## 2022-08-17 NOTE — ED Notes (Signed)
Sitter attempted to get pt's vitals, pt refused.

## 2022-08-17 NOTE — ED Provider Notes (Signed)
Emergency Medicine Observation Re-evaluation Note  [name] is a 51 y.o. male, seen on rounds today.  Pt initially presented to the ED for complaints of IVC Currently, the patient is stay in bed.  Physical Exam  BP (!) 85/62 (BP Location: Right Arm)   Pulse (!) 58   Temp 97.9 F (36.6 C) (Oral)   Resp 16   SpO2 99%  Physical Exam General: Awake, alert, nondistressed Cardiac: Extremities perfused, continued soft blood pressure Lungs: Breathing is unlabored Psych: Continued paranoia regarding medications  ED Course / MDM  EKG:   I have reviewed the labs performed to date as well as medications administered while in observation.  Recent changes in the last 24 hours include refusal of medications and fluids, ongoing soft blood pressures.  Plan  Current plan is for inpatient psychiatric admission.    Godfrey Pick, MD 08/17/22 [phone]

## 2022-08-17 NOTE — ED Notes (Signed)
Spoke with pts mother, updated her on pts plan of care

## 2022-08-17 NOTE — ED Notes (Addendum)
Pt walking out of room.  Offered food and drink.  Pt agreed to drinking water, took one sip and then spit it out. PT is pacing back and fourth in the department.  No attempt to escape.

## 2022-08-17 NOTE — ED Notes (Signed)
Patient ambulated to the bathroom. Patient is now back in bed.

## 2022-08-17 NOTE — ED Notes (Signed)
Pt refused medication, states he does not trust any medicine. MD Dixon aware.

## 2022-08-18 DIAGNOSIS — Z046 Encounter for general psychiatric examination, requested by authority: Secondary | ICD-10-CM | POA: Diagnosis not present

## 2022-08-18 NOTE — ED Notes (Signed)
Pt refused to have their vitals done

## 2022-08-18 NOTE — ED Notes (Addendum)
Attempted to update mother [name] at [phone], no answer and unable to leave a voice mail due to voice mail being full.

## 2022-08-18 NOTE — ED Notes (Addendum)
Pt has been told several times to go back to his room and stop wondering around. Pt is being uncooperative about wanting to follow directions. Pt was redirected with extra co-workers.

## 2022-08-18 NOTE — Progress Notes (Addendum)
Pt was accepted to [facility] 08/19/22; Bed Assignment [name] A   Pt meets inpatient criteria per Jerelene Redden, NP   Attending Physician will be Dr. Reece Levy   Report can be called to: [phone] or [phone]  Pt can arrive after 9:00am  Care Team notified: [name], RN  [name], [name] 08/18/2022 @ 8:05 PM

## 2022-08-18 NOTE — ED Provider Notes (Signed)
Emergency Medicine Observation Re-evaluation Note  [name] is a 51 y.o. male, seen on rounds today.  Pt initially presented to the ED for complaints of IVC Currently, the patient is resting comfortably.  Physical Exam  BP 94/62 (BP Location: Left Arm)   Pulse (!) 50   Temp 98 F (36.7 C)   Resp 16   SpO2 98%  Physical Exam General: NAD   ED Course / MDM  EKG:   I have reviewed the labs performed to date as well as medications administered while in observation.  Recent changes in the last 24 hours include no acute events reported.  Patient with continued paranoia.  Plan  Current plan is for inpatient psych placement.    Valarie Merino, MD 08/18/22 [phone]

## 2022-08-18 NOTE — ED Notes (Signed)
IVC'd 08/15/22, exp 08/22/22. IVC docs in purple [place].

## 2022-08-18 NOTE — ED Notes (Signed)
Mother Merrilyn Puma [phone] would like an update immediately

## 2022-08-18 NOTE — Progress Notes (Signed)
CSW sent [facility] documents as requested by [facility] placement. Per Jerelene Redden, NP pt meets inpatient criteria. CSW also sent to out of network providers.    Destination  Service Provider Address Phone Fax  [place] [address] [phone] [facility]  [address] [place] [address] [phone] [facility]  [place], [place] [address] [place]  [facility]  [address] [place] [address] [phone]  [facility]  [place], [place] [address] [phone]  [facility]  [place] [place] [address] [place]  [facility]  [address], [place] [address] [phone]  [place] [place] [address] [phone]  [facility]  [place] [address] [phone] [place]  [address], [place] [address] [phone] [facility]  [address], [place] [address] [place]  [address], [place] [address] [phone] [facility]  [place], [place] [address] [phone]  [facility]  [address] [place], [place] [address] [place]  [facility]  [address] [place] [address] [phone] [place], MSW, LCSWA 08/18/2022 1:10 PM

## 2022-08-18 NOTE — Progress Notes (Signed)
LCSW Progress Note  [id number]   Mali A Stoke  08/18/2022  11:08 AM  Description:   Inpatient Psychiatric Referral  Patient was recommended inpatient per  Merlyn Lot, NP. There are no available beds at [facility]. Patient was referred to the following facilities:   Destination Service Provider Address Phone Fax  [facility]  [address] [place] [address] [phone] [facility]  [address] [place] [address] [phone] [facility]  [place], [place] [address] [place]  [facility]  [address] [place] [address] [phone] [place]  [place], [place] [address] [phone]  [facility]  [address] [place] [address] [place]  [facility]  [address], [place] [address] [phone]  [place] [address] [place] [address] [phone]  [facility]  [place] [address] [phone]  [facility]  [address], [place] [address] [phone]  [facility]  [address], [place] [address] [place]  [address] [place] [address] [phone] [facility]  [place], [place] [address] [phone]  [facility]  [address] [place], [place] [address] [place]  [facility]  [address], [place] [address] [phone]     Situation ongoing, CSW to continue following and update chart as more information  becomes available.      Denna Haggard, [place]  08/18/2022 11:08 AM

## 2022-08-19 NOTE — ED Notes (Signed)
Patient's mother called to check how patient was; Patient currently sleeping at this time-Monique,RN

## 2022-08-19 NOTE — ED Notes (Signed)
IVC'd 08/15/22, exp 08/22/22, IVC docs in [place]

## 2022-08-19 NOTE — ED Notes (Signed)
Sheriff called for transport information on Pt . No time given for pick up.

## 2022-08-19 NOTE — ED Provider Notes (Signed)
Emergency Medicine Observation Re-evaluation Note  [name] is a 51 y.o. male, seen on rounds today.  Pt initially presented to the ED for complaints of IVC Currently, the patient is resting comfortably.  Physical Exam  BP 93/64 (BP Location: Left Arm)   Pulse (!) 50   Temp (!) 97.4 F (36.3 C) (Oral)   Resp 16   SpO2 100%  Physical Exam General: NAD   ED Course / MDM  EKG:   I have reviewed the labs performed to date as well as medications administered while in observation.  Recent changes in the last 24 hours include no acute events reported.  Plan  Current plan is for placement.  [facility] accepted at [facility].  Dr. Reece Levy accepting.  Patient to be transported today.  Patient on IVC hold.  IVC expires December 15.    Valarie Merino, MD 08/19/22 [phone]

## 2022-09-09 ENCOUNTER — Emergency Department (HOSPITAL_COMMUNITY)
Admission: EM | Admit: 2022-09-09 | Discharge: 2022-09-10 | Disposition: A | Discharge status: Other Acute Inpatient Hospital | Payer: 59 | Attending: Emergency Medicine | Admitting: Emergency Medicine

## 2022-09-09 ENCOUNTER — Other Ambulatory Visit: Payer: Self-pay

## 2022-09-09 DIAGNOSIS — J449 Chronic obstructive pulmonary disease, unspecified: Secondary | ICD-10-CM | POA: Diagnosis not present

## 2022-09-09 DIAGNOSIS — Z046 Encounter for general psychiatric examination, requested by authority: Secondary | ICD-10-CM | POA: Diagnosis present

## 2022-09-09 DIAGNOSIS — F29 Unspecified psychosis not due to a substance or known physiological condition: Secondary | ICD-10-CM | POA: Diagnosis not present

## 2022-09-09 DIAGNOSIS — Z20822 Contact with and (suspected) exposure to covid-19: Secondary | ICD-10-CM | POA: Insufficient documentation

## 2022-09-09 DIAGNOSIS — R45850 Homicidal ideations: Secondary | ICD-10-CM | POA: Diagnosis not present

## 2022-09-09 DIAGNOSIS — Z87891 Personal history of nicotine dependence: Secondary | ICD-10-CM | POA: Diagnosis not present

## 2022-09-09 DIAGNOSIS — F200 Paranoid schizophrenia: Secondary | ICD-10-CM | POA: Diagnosis present

## 2022-09-09 DIAGNOSIS — J45909 Unspecified asthma, uncomplicated: Secondary | ICD-10-CM | POA: Diagnosis not present

## 2022-09-09 DIAGNOSIS — I509 Heart failure, unspecified: Secondary | ICD-10-CM | POA: Diagnosis not present

## 2022-09-09 LAB — COMPREHENSIVE METABOLIC PANEL
ALT: 18 U/L (ref 0–44)
AST: 17 U/L (ref 15–41)
Albumin: 4.1 g/dL (ref 3.5–5.0)
Alkaline Phosphatase: 51 U/L (ref 38–126)
Anion gap: 11 (ref 5–15)
BUN: 9 mg/dL (ref 6–20)
CO2: 24 mmol/L (ref 22–32)
Calcium: 9.4 mg/dL (ref 8.9–10.3)
Chloride: 104 mmol/L (ref 98–111)
Creatinine, Ser: 0.75 mg/dL (ref 0.61–1.24)
GFR, Estimated: >60 mL/min (ref >60)
Glucose, Bld: 90 mg/dL (ref 70–99)
Potassium: 3.5 mmol/L (ref 3.5–5.1)
Sodium: 139 mmol/L (ref 135–145)
Total Bilirubin: 1 mg/dL (ref 0.3–1.2)
Total Protein: 6.9 g/dL (ref 6.5–8.1)

## 2022-09-09 LAB — CBC WITH DIFFERENTIAL/PLATELET
Abs Immature Granulocytes: 0.01 10*3/uL (ref 0.00–0.07)
Basophils Absolute: 0.1 10*3/uL (ref 0.0–0.1)
Basophils Relative: 1 %
Eosinophils Absolute: 0 10*3/uL (ref 0.0–0.5)
Eosinophils Relative: 0 %
HCT: 46.3 % (ref 39.0–52.0)
Hemoglobin: 15.1 g/dL (ref 13.0–17.0)
Immature Granulocytes: 0 %
Lymphocytes Relative: 28 %
Lymphs Abs: 1.9 10*3/uL (ref 0.7–4.0)
MCH: 28.9 pg (ref 26.0–34.0)
MCHC: 32.6 g/dL (ref 30.0–36.0)
MCV: 88.7 fL (ref 80.0–100.0)
Monocytes Absolute: 0.5 10*3/uL (ref 0.1–1.0)
Monocytes Relative: 7 %
Neutro Abs: 4.3 10*3/uL (ref 1.7–7.7)
Neutrophils Relative %: 64 %
Platelets: 242 10*3/uL (ref 150–400)
RBC: 5.22 MIL/uL (ref 4.22–5.81)
RDW: 12.7 % (ref 11.5–15.5)
WBC: 6.8 10*3/uL (ref 4.0–10.5)
nRBC: 0 % (ref 0.0–0.2)

## 2022-09-09 LAB — ETHANOL: Alcohol, Ethyl (B): <10 mg/dL (ref <10)

## 2022-09-09 LAB — RAPID URINE DRUG SCREEN, HOSP PERFORMED
Amphetamines: NOT DETECTED
Barbiturates: NOT DETECTED
Benzodiazepines: NOT DETECTED
Cocaine: NOT DETECTED
Opiates: NOT DETECTED
Tetrahydrocannabinol: NOT DETECTED

## 2022-09-09 LAB — RESP PANEL BY RT-PCR (RSV, FLU A&B, COVID)  RVPGX2
Influenza A by PCR: NEGATIVE
Influenza B by PCR: NEGATIVE
Resp Syncytial Virus by PCR: NEGATIVE
SARS Coronavirus 2 by RT PCR: NEGATIVE

## 2022-09-09 MED ORDER — LORAZEPAM 1 MG PO TABS: 1.0000 mg | ORAL_TABLET | ORAL | Status: DC | PRN

## 2022-09-09 MED ORDER — ACETAMINOPHEN 325 MG PO TABS: 650.0000 mg | ORAL_TABLET | ORAL | Status: DC | PRN

## 2022-09-09 MED ORDER — ZIPRASIDONE MESYLATE 20 MG IM SOLR
20.0000 mg | INTRAMUSCULAR | Status: AC | PRN
  Administered 2022-09-09: 20 mg via INTRAMUSCULAR
  Filled 2022-09-09: qty 20

## 2022-09-09 MED ORDER — NICOTINE 21 MG/24HR TD PT24: 21.0000 mg | MEDICATED_PATCH | Freq: Every day | TRANSDERMAL | Status: DC

## 2022-09-09 MED ORDER — RISPERIDONE 0.5 MG PO TBDP: 2.0000 mg | ORAL_TABLET | Freq: Three times a day (TID) | ORAL | Status: DC | PRN

## 2022-09-09 MED ORDER — ONDANSETRON HCL 4 MG PO TABS: 4.0000 mg | ORAL_TABLET | Freq: Three times a day (TID) | ORAL | Status: DC | PRN

## 2022-09-09 MED ORDER — ALUM & MAG HYDROXIDE-SIMETH 200-200-20 MG/5ML PO SUSP: 30.0000 mL | Freq: Four times a day (QID) | ORAL | Status: DC | PRN

## 2022-09-09 MED ORDER — STERILE WATER FOR INJECTION IJ SOLN
INTRAMUSCULAR | Status: AC
  Filled 2022-09-09: qty 10

## 2022-09-09 MED ORDER — ZOLPIDEM TARTRATE 5 MG PO TABS: 5.0000 mg | ORAL_TABLET | Freq: Every evening | ORAL | Status: DC | PRN

## 2022-09-09 NOTE — ED Notes (Signed)
Pt continuously coming out of rm and needing to be redirected back into rm. Pt sat on floor and laid down stating "My neck and my back hurt", then stood up and said "nope, they don't hurt"
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
## 2022-09-09 NOTE — ED Notes (Signed)
Pt dressed out and belongings placed in bag in cabinet. Dentures, and smart watch in bag.
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
## 2022-09-09 NOTE — ED Provider Notes (Signed)
I provided a substantive portion of the care of this patient.  I personally performed the entirety of the history for this encounter.     Patient brought to ED under IVC condition.  Patient's mother filed IVC stating patient had expressed suicidal thoughts.  Patient exhibiting psychosis and paranoia.   Charlesetta Shanks, MD 09/17/22 [phone]
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
## 2022-09-09 NOTE — ED Notes (Signed)
Pt refused bloodwork and EKG
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
## 2022-09-10 NOTE — ED Notes (Signed)
[facility] called and advised they were enroute to pick pt up.

## 2022-09-10 NOTE — ED Notes (Signed)
Patient is up and down in his room.  He is redirectable.  Awaiting TTS assessment.

## 2022-09-10 NOTE — ED Notes (Signed)
Patient has eaten breakfast and TTS assessment has been completed at the bedside.

## 2022-09-10 NOTE — ED Notes (Signed)
[facility] called for transport and advised transport would be sometime today.
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
## 2022-09-10 NOTE — ED Provider Notes (Signed)
Emergency Medicine Observation Re-evaluation Note  [name] is a 52 y.o. male, seen on rounds today.  Pt initially presented to the ED for complaints of IVC Currently, the patient is calm and cooperative.  Physical Exam  BP 94/63 (BP Location: Left Arm)   Pulse (!) 52   Temp 98.7 F (37.1 C) (Oral)   Resp 16   SpO2 98%  Physical Exam General: nad Cardiac: regular Lungs: no respiratory distress Psych: depressed  ED Course / MDM  EKG:   I have reviewed the labs performed to date as well as medications administered while in observation.  Recent changes in the last 24 hours include none.  Plan  Current plan is for accepted at [facility].    Blanchie Dessert, MD 09/10/22 1754

## 2022-09-10 NOTE — ED Notes (Signed)
Patient up walking in the room, flat affect.  He agrees at this time to participate in the TTS assessment.  Patient denies SI/HI. He states he feels like he is in the wrong place.  Patient noted to rub his head while talking, poor to no eye contact.  He states he is waiting on his breakfast.

## 2022-09-10 NOTE — BHH Counselor (Signed)
Clinician attempted TTS assessment. Per RN Malinda Motsinger, patient is sleeping and unable to engage.   Darra Lis, MSW, LCSW TTS Clinician

## 2022-09-10 NOTE — ED Notes (Signed)
[place] paged at [phone] and call back number left to give report.

## 2022-09-10 NOTE — Progress Notes (Addendum)
Pt was accepted to [facility] 09/10/2022, pending negative covid faxed to [phone]  Pt meets inpatient criteria per Michaele Offer, [place]  Attending Physician will be Jonelle Sports, MD  Report can be called to: [phone] (this is a pager, please leave return call-back number when giving report)  Bed is ready now  Care Team Notified: Michaele Offer, [place], Terri Piedra, RN, and [address], [place], [place]  09/10/2022 2:56 PM

## 2022-09-10 NOTE — Progress Notes (Signed)
LCSW Progress Note  [id number]   [name]  09/10/2022  2:36 PM  Description:   Inpatient Psychiatric Referral  Patient was recommended inpatient per [facility]. There are no available beds at [facility]. Patient was referred to the following facilities:   Destination  Service Provider Address Phone Fax  [facility]  [address] [place] [address] [phone]  [place]  [address], [place] [address] [phone]  [facility]  [place], [place] [address] [phone]  [facility]  [place], [place] [address] [place]  [facility]  [address] [place] [address] [phone] [place]  [place], [place] [address] [phone]  [facility]  [address] [place] [address] [phone]  [facility]  [address], [place] [address] [phone]  [facility]  [address] [place] [address] [phone] [facility] Ramirez  [address], [place] [address] [phone]  [facility]  [address] [place] [address] [place]  [address], [place] [address] [phone]  [facility]  [address] [place] [address] [phone] [place]  [address] [place] [address] [phone]  [facility]  [address] [place] [address] [phone]      Situation ongoing, CSW to continue following and update  chart as more information becomes available.      Denna Haggard, Latanya Presser  09/10/2022 2:36 PM

## 2022-11-08 ENCOUNTER — Other Ambulatory Visit: Payer: Self-pay

## 2022-11-08 ENCOUNTER — Emergency Department (HOSPITAL_COMMUNITY)
Admission: EM | Admit: 2022-11-08 | Discharge: 2022-11-11 | Disposition: A | Discharge status: Other Acute Inpatient Hospital | Payer: 59 | Attending: Emergency Medicine | Admitting: Emergency Medicine

## 2022-11-08 ENCOUNTER — Encounter (HOSPITAL_COMMUNITY): Payer: Self-pay

## 2022-11-08 DIAGNOSIS — J45909 Unspecified asthma, uncomplicated: Secondary | ICD-10-CM | POA: Insufficient documentation

## 2022-11-08 DIAGNOSIS — Z87891 Personal history of nicotine dependence: Secondary | ICD-10-CM | POA: Diagnosis not present

## 2022-11-08 DIAGNOSIS — F200 Paranoid schizophrenia: Secondary | ICD-10-CM | POA: Diagnosis present

## 2022-11-08 DIAGNOSIS — Z1152 Encounter for screening for COVID-19: Secondary | ICD-10-CM | POA: Insufficient documentation

## 2022-11-08 DIAGNOSIS — I509 Heart failure, unspecified: Secondary | ICD-10-CM | POA: Insufficient documentation

## 2022-11-08 DIAGNOSIS — J449 Chronic obstructive pulmonary disease, unspecified: Secondary | ICD-10-CM | POA: Insufficient documentation

## 2022-11-08 DIAGNOSIS — F22 Delusional disorders: Secondary | ICD-10-CM
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
## 2022-11-08 NOTE — ED Provider Notes (Signed)
[facility] Provider Note  CSN: [id number] Arrival date & time: 11/08/22 2234  Chief Complaint(s) Psychiatric Evaluation  HPI [name] is a 52 y.o. male {Add pertinent medical, surgical, social history, OB history to HPI:1}   HPI  Past Medical History Past Medical History:  Diagnosis Date   Asthma    CHF (congestive heart failure) ([place])    Chronic back pain    COPD (chronic obstructive pulmonary disease) (HCC)    Knee pain, chronic    Migraines    Schizophrenia, schizo-affective ([place])    Patient Active Problem List   Diagnosis Date Noted   Chronic neck pain 09/23/2021   Kyphosis 09/23/2021   Osteoarthritis of spine 09/23/2021   Schizophrenia ([place]) 09/18/2021   Localized, primary osteoarthritis 09/18/2021   Osteoarthritis of knee 09/18/2021   Need for influenza vaccination 06/18/2021   Pain in thoracic spine 06/05/2021   Common wart 09/09/2020   Opioid type dependence, continuous ([place]) 06/15/2020   MDD (major depressive disorder), recurrent severe, without psychosis ([place]) 01/16/2020   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   Acute respiratory failure with hypoxia (HCC)    Polysubstance overdose 02/26/2019   Laceration of Achilles tendon, right, initial encounter    Shock ([place]) 08/28/2018   Suicide attempt ([place])    Benzodiazepine (tranquilizer) overdose, intentional self-harm, initial encounter ([place]) 06/13/2018   Overdose of benzodiazepine, intentional self-harm, initial encounter ([place])    Somnolence    Schizophrenia, paranoid type ([place]) 08/19/2017   Chronic pain of left knee 08/13/2016   Shortness of breath 03/26/2016   Smoking greater than 30 pack years 03/26/2016   Acne vulgaris 10/24/2015   Abnormal blood chemistry 10/24/2015   Caffeine toxicity ([place]) 10/24/2015   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 Authorizing Provider  acetaminophen (TYLENOL) 325 MG tablet Take 650 mg by mouth every 6 (six) hours as needed for mild pain or moderate pain.    [provider]  albuterol (VENTOLIN HFA) 108 (90 Base) MCG/ACT inhaler Inhale 2 puffs into the lungs every 4 (four) hours as needed for wheezing or shortness of breath. 09/02/19   Varney Biles, MD  ALPRAZolam [name]) 1 MG tablet Take 1 mg by mouth 3 (three) times daily as needed for anxiety.    [provider]  benztropine (COGENTIN) 0.5 MG tablet Take 0.5 mg by mouth at bedtime. 07/05/22   [provider]  chlorproMAZINE (THORAZINE) 100 MG tablet Take 1 tablet (100 mg total) by mouth at bedtime. Patient not taking: Reported on 09/10/2022 09/23/21   Freida Busman, MD  chlorproMAZINE (THORAZINE) 50 MG tablet Take 1 tablet (50 mg total) by mouth in the morning. Patient not taking: Reported on 06/18/2022 09/24/21   Freida Busman, MD  HYDROcodone-acetaminophen (NORCO) 7.5-325 MG tablet Take 1 tablet by mouth 3 (three) times daily as needed. Patient not taking: Reported on 08/17/2022 05/27/22   [provider]  INVEGA SUSTENNA 156 MG/ML SUSY injection  Inject into the muscle. Patient not taking: Reported on 08/17/2022 07/11/22   [provider]  OLANZapine (ZYPREXA) 5 MG tablet Take 5 mg by mouth at bedtime. Patient not taking: Reported on 08/17/2022 07/05/22   [provider]  paliperidone (INVEGA SUSTENNA) 234 MG/1.5ML injection Inject 234 mg into the muscle  once. Patient not taking: Reported on 08/17/2022    [provider]  paliperidone (INVEGA) 3 MG 24 hr tablet Take 3 mg by mouth 2 (two) times daily. Patient not taking: Reported on 08/17/2022 07/05/22   [provider]                                                                                                                                    Allergies Amoxicillin, Dextromethorphan-guaifenesin, Haloperidol, Haloperidol lactate, Meloxicam, Nsaids, Quetiapine, Sudafed [pseudoephedrine hcl], Ziprasidone, and Prednisone  Review of Systems Review of Systems As noted in HPI  Physical Exam Vital Signs  I have reviewed the triage vital signs BP (!) 132/98 (BP Location: Left Arm)   Pulse (!) 124 Comment: RN made aware of elevated heart rate.  Temp 98.8 F (37.1 C) (Oral)   Resp 18   Ht 6' (1.829 m)   Wt 70.3 kg   SpO2 96%   BMI 21.02 kg/m  *** Physical Exam  ED Results and Treatments Labs (all labs ordered are listed, but only abnormal results are displayed) Labs Reviewed - No data to display                                                                                                                       EKG  EKG Interpretation  Date/Time:    Ventricular Rate:    PR Interval:    QRS Duration:   QT Interval:    QTC Calculation:   R Axis:     Text Interpretation:         Radiology No results found.  Medications Ordered in ED Medications - No data to display  Procedures Procedures  (including critical care time)  Medical Decision Making / ED Course  Click here for ABCD2, HEART and other calculators  Medical Decision Making         Final Clinical Impression(s) / ED Diagnoses Final diagnoses:  None    {Document critical care time when appropriate:1}  {Document review of labs and clinical decision tools  ie heart score, Chads2Vasc2 etc:1}  {Document your independent review of radiology images, and any outside records:1} {Document your discussion with family members, caretakers, and with consultants:1} {Document social determinants of health affecting pt's care:1} {Document your decision making why or why not admission, treatments were needed:1} This chart was dictated using voice recognition software.  Despite best efforts to proofread,  errors can occur which can change the documentation meaning.

## 2022-11-08 NOTE — ED Notes (Signed)
Pt. In burgundy scrubs and wanded by security. Pt. Has 1 pr. Black shoes, 1 blue jean 1, green flannel shirt, 1 black wallet, 1 black cell phone and 4 sets car key/house keys and 1 brown belt. Pt. Belongings locked up in cabinet 16-18 behind the nurses station.

## 2022-11-09 DIAGNOSIS — F200 Paranoid schizophrenia: Secondary | ICD-10-CM | POA: Diagnosis not present

## 2022-11-09 LAB — CBC WITH DIFFERENTIAL/PLATELET
Abs Immature Granulocytes: 0.01 10*3/uL (ref 0.00–0.07)
Basophils Absolute: 0.1 10*3/uL (ref 0.0–0.1)
Basophils Relative: 1 %
Eosinophils Absolute: 0 10*3/uL (ref 0.0–0.5)
Eosinophils Relative: 1 %
HCT: 47.5 % (ref 39.0–52.0)
Hemoglobin: 15.5 g/dL (ref 13.0–17.0)
Immature Granulocytes: 0 %
Lymphocytes Relative: 31 %
Lymphs Abs: 1.9 10*3/uL (ref 0.7–4.0)
MCH: 29.1 pg (ref 26.0–34.0)
MCHC: 32.6 g/dL (ref 30.0–36.0)
MCV: 89.1 fL (ref 80.0–100.0)
Monocytes Absolute: 0.5 10*3/uL (ref 0.1–1.0)
Monocytes Relative: 8 %
Neutro Abs: 3.5 10*3/uL (ref 1.7–7.7)
Neutrophils Relative %: 59 %
Platelets: 230 10*3/uL (ref 150–400)
RBC: 5.33 MIL/uL (ref 4.22–5.81)
RDW: 12.6 % (ref 11.5–15.5)
WBC: 6 10*3/uL (ref 4.0–10.5)
nRBC: 0 % (ref 0.0–0.2)

## 2022-11-09 LAB — COMPREHENSIVE METABOLIC PANEL
ALT: 12 U/L (ref 0–44)
AST: 17 U/L (ref 15–41)
Albumin: 3.9 g/dL (ref 3.5–5.0)
Alkaline Phosphatase: 45 U/L (ref 38–126)
Anion gap: 7 (ref 5–15)
BUN: 10 mg/dL (ref 6–20)
CO2: 29 mmol/L (ref 22–32)
Calcium: 9.5 mg/dL (ref 8.9–10.3)
Chloride: 103 mmol/L (ref 98–111)
Creatinine, Ser: 0.91 mg/dL (ref 0.61–1.24)
GFR, Estimated: >60 mL/min (ref >60)
Glucose, Bld: 116 mg/dL — ABNORMAL HIGH (ref 70–99)
Potassium: 3.4 mmol/L — ABNORMAL LOW (ref 3.5–5.1)
Sodium: 139 mmol/L (ref 135–145)
Total Bilirubin: 0.7 mg/dL (ref 0.3–1.2)
Total Protein: 6.8 g/dL (ref 6.5–8.1)

## 2022-11-09 LAB — ETHANOL: Alcohol, Ethyl (B): <10 mg/dL (ref <10)

## 2022-11-09 LAB — RESP PANEL BY RT-PCR (RSV, FLU A&B, COVID)  RVPGX2
Influenza A by PCR: NEGATIVE
Influenza B by PCR: NEGATIVE
Resp Syncytial Virus by PCR: NEGATIVE
SARS Coronavirus 2 by RT PCR: NEGATIVE

## 2022-11-09 MED ORDER — POTASSIUM CHLORIDE CRYS ER 20 MEQ PO TBCR
40.0000 meq | EXTENDED_RELEASE_TABLET | Freq: Once | ORAL | Status: DC
  Filled 2022-11-09: qty 2

## 2022-11-09 MED ORDER — BENZTROPINE MESYLATE 0.5 MG PO TABS
0.5000 mg | ORAL_TABLET | Freq: Every day | ORAL | Status: DC
  Administered 2022-11-09 – 2022-11-11 (×2): 0.5 mg via ORAL
  Filled 2022-11-09 (×3): qty 1

## 2022-11-09 MED ORDER — TRAZODONE HCL 50 MG PO TABS
50.0000 mg | ORAL_TABLET | Freq: Every evening | ORAL | Status: DC | PRN
  Administered 2022-11-10: 50 mg via ORAL
  Filled 2022-11-09: qty 1

## 2022-11-09 MED ORDER — ACETAMINOPHEN 325 MG PO TABS
650.0000 mg | ORAL_TABLET | Freq: Four times a day (QID) | ORAL | Status: DC | PRN
  Administered 2022-11-10: 650 mg via ORAL
  Filled 2022-11-09: qty 2

## 2022-11-09 MED ORDER — HYDROXYZINE HCL 25 MG PO TABS
25.0000 mg | ORAL_TABLET | Freq: Three times a day (TID) | ORAL | Status: DC | PRN
  Administered 2022-11-10: 25 mg via ORAL
  Filled 2022-11-09: qty 1

## 2022-11-09 MED ORDER — PALIPERIDONE ER 6 MG PO TB24
6.0000 mg | ORAL_TABLET | Freq: Every day | ORAL | Status: DC
  Administered 2022-11-09 – 2022-11-11 (×2): 6 mg via ORAL
  Filled 2022-11-09 (×3): qty 1

## 2022-11-09 NOTE — ED Provider Notes (Signed)
Emergency Medicine Observation Re-evaluation Note  [name] is a 52 y.o. male, seen on rounds today.  Pt initially presented to the ED for complaints of Psychiatric Evaluation Currently, the patient is resting .  Physical Exam  BP (!) 94/58 (BP Location: Right Arm)   Pulse 68   Temp 98 F (36.7 C) (Oral)   Resp 16   Ht 1.829 m (6')   Wt 70.3 kg   SpO2 99%   BMI 21.02 kg/m  Physical Exam General: no acute distresss Cardiac: rrr Lungs: no distress Psych: resting  ED Course / MDM  EKG:   I have reviewed the labs performed to date as well as medications administered while in observation.  Recent changes in the last 24 hours include covid negative Labs ordered and pending.  Plan  Current plan is for psychiatric admission.    Pattricia Boss, MD 11/09/22 [phone]

## 2022-11-09 NOTE — ED Notes (Signed)
Patient has been alert this shift. Patient has eaten and drank fluids this shift. Patient uncooperative at times with care. Patients responds "no" and then at times with be cooperative.  Patient disorganized. Patient has paranoid thoughts and delusional ideas. Patient ambulatory. Patient denied suicidal ideation at this time. Patient denied homicidal ideation at this time.

## 2022-11-09 NOTE — ED Notes (Addendum)
Attempted to complete EKG but pt refused, stating "you're just trying to find another way to murder me." Pt also refused to provide urine sample as ordered.

## 2022-11-09 NOTE — BH Assessment (Signed)
Patient did not look at or engage with clinician.  Clinician let pt know that there would be another provider in to see him during the day.

## 2022-11-09 NOTE — ED Notes (Signed)
TTS assessment attempted. Pt would not engage with counselor.

## 2022-11-09 NOTE — ED Notes (Signed)
Pt belongings placed in locker 37 in [facility]. 1 bag

## 2022-11-09 NOTE — ED Notes (Signed)
Pt given breakfast tray. Pt is very withdrawn, will not engage in conversation but will intermittently nod or shake head in response to basic questions.

## 2022-11-09 NOTE — ED Notes (Signed)
Pt refusing labs and covid swab. Will redirect and attempt again.

## 2022-11-09 NOTE — Consult Note (Signed)
[facility] ED ASSESSMENT   Reason for Consult:  Psychiatric evaluation Referring Physician:  ER Physician Patient Identification: [name] MRN:  [id number] ED Chief Complaint: Schizophrenia, paranoid type ([place])  Diagnosis:  Principal Problem:   Schizophrenia, paranoid type [facility])   ED Assessment Time Calculation: Start Time: 1249 Stop Time: 1310 Total Time in Minutes (Assessment Completion): 21   Subjective:   [name] is a 52 y.o. male patient admitted with hx of Schizophrenia /Schizoaffective disorder, anxiety, suicide attempts was brought in by [facility] under IVC taken out by his mother.for refusing to eat and accusing his mother of poisoning him.  For a month patient have been eating cookies and drinking pop soda.   HPI:  Patient was met this morning in the room.  He stated talking with provider and suddenly stopped.  Patient admitted he has not eaten in a month and that the reason is that the food is poisoned by his mother.  He lives in his own apartment and only gets visits once or twice a week.  Patient refused drinking or using water from his faucet stating the water coming out is toilet water.  Patient suddenly stopped speaking to provider and other staff. Collateral information from his mother MS  Judd Lien is that patient have not been eating for about a month and has been eating cookies.  He believes every food he touches or is given is poisoned by his mother.   Patient was recently discharged from [facility] in [place] and since discharge have not gotten his invega sustenna injection.  Patient was dropped by his [facility] team when they could not find him not knowing that he was in the hospital.  Patient took his injection from the Pharmacy and called [facility] team to administer to him but they refused and gave him a new form to apply for their service.  Patient was going to give injection to himself but his mother stopped him.  Another injection was delivered to patient by mail but he did not  have anybody to give to him.  His mother does not know what happened to the the two injections.  Meanwhile patient continues to refuse food because his food are poisoned.  He continues to eat cookies and drink pop soda.   Patient meets criteria for inpatient Psychiatric hospitalization for safety and stabilization.  Patient will resume his home medications.  We will seek bed placement at any facility with available bed.    Past Psychiatric History: hx of Schizophrenia /Schizoaffective disorder, anxiety, suicide attempts.  Multiple inpatient Psychiatric hospitalization.  Multiple suicide attempts last was 2-3 years ago per mom.  He was with [facility] but was dropped  when he was in the hospital.  Risk to Self or Others: Is the patient at risk to self? No Has the patient been a risk to self in the past 6 months? No Has the patient been a risk to self within the distant past? No Is the patient a risk to others? No Has the patient been a risk to others in the past 6 months? No Has the patient been a risk to others within the distant past? No  [place] Scale:  [facility] ED from 11/08/2022 in [facility] Emergency Department at [facility] ED from 09/09/2022 in [facility] Emergency Department at [facility] ED from 08/15/2022 in [facility] Emergency Department at [place] No Risk No Risk No Risk  AIMS:  , , ,  ,   ASAM:    Substance Abuse:     Past Medical History:  Past Medical History:  Diagnosis Date   Asthma    CHF (congestive heart failure) (HCC)    Chronic back pain    COPD (chronic obstructive pulmonary disease) (HCC)    Knee pain, chronic    Migraines    Schizophrenia, schizo-affective ([place])     Past Surgical History:  Procedure Laterality Date   KNEE SURGERY     four times   WISDOM TOOTH EXTRACTION     Family History:  Family History  Problem Relation Age of Onset   Hypertension Father    Family Psychiatric   History: Sister-Schizophrenia. Social History:  Social History   Substance and Sexual Activity  Alcohol Use Yes   Comment: occ     Social History   Substance and Sexual Activity  Drug Use No   Comment: Pt denied    Social History   Socioeconomic History   Marital status: Widowed    Spouse name: Not on file   Number of children: Not on file   Years of education: Not on file   Highest education level: Not on file  Occupational History   Occupation: on disability  Tobacco Use   Smoking status: Former    Packs/day: 1.00    Years: 33.00    Total pack years: 33.00    Types: Cigarettes   Smokeless tobacco: Never  Vaping Use   Vaping Use: Never used  Substance and Sexual Activity   Alcohol use: Yes    Comment: occ   Drug use: No    Comment: Pt denied   Sexual activity: Not Currently  Other Topics Concern   Not on file  Social History Narrative   Pt is a widower.  He lives alone, and he receives SSI Disability.  Pt was followed by Dr. Darleene Cleaver, but he has not seen Dr. Darleene Cleaver in several months.   Social Determinants of Health   Financial Resource Strain: Not on file  Food Insecurity: Not on file  Transportation Needs: Not on file  Physical Activity: Not on file  Stress: Not on file  Social Connections: Not on file   Additional Social History:    Allergies:   Allergies  Allergen Reactions   Amoxicillin Diarrhea    Has patient had a PCN reaction causing immediate rash, facial/tongue/throat swelling, SOB or lightheadedness with hypotension: No Has patient had a PCN reaction causing severe rash involving mucus membranes or skin necrosis: No Has patient had a PCN reaction that required hospitalization: No Has patient had a PCN reaction occurring within the last 10 years: No If all of the above answers are "NO", then may proceed with Cephalosporin use. Caused hallucinations and pain  Pt did not elaborate   Dextromethorphan-Guaifenesin Other (See Comments)     Bleeding from the brain    Haloperidol Other (See Comments)    Partial  paralysis   Haloperidol Lactate    Meloxicam Other (See Comments)    unspecified   Nsaids Other (See Comments)    Rectal bleeding   Quetiapine Other (See Comments)    Psychosis with high dose ('600mg'$ )   Sudafed [Pseudoephedrine Hcl] Other (See Comments)    Hallucinations   Ziprasidone Other (See Comments)    unspecified   Prednisone Other (See Comments) and Anxiety    Psychotic episodes  Patient states it makes me psychotic    Labs:  Results for  orders placed or performed during the hospital encounter of 11/08/22 (from the past 48 hour(s))  Resp panel by RT-PCR (RSV, Flu A&B, Covid) Anterior Nasal Swab     Status: None   Collection Time: 11/08/22 11:54 PM   Specimen: Anterior Nasal Swab  Result Value Ref Range   SARS Coronavirus 2 by RT PCR NEGATIVE NEGATIVE    Comment: (NOTE) SARS-CoV-2 target nucleic acids are NOT DETECTED.  The SARS-CoV-2 RNA is generally detectable in upper respiratory specimens during the acute phase of infection. The lowest concentration of SARS-CoV-2 viral copies this assay can detect is 138 copies/mL. A negative result does not preclude SARS-Cov-2 infection and should not be used as the sole basis for treatment or other patient management decisions. A negative result may occur with  improper specimen collection/handling, submission of specimen other than nasopharyngeal swab, presence of viral mutation(s) within the areas targeted by this assay, and inadequate number of viral copies(<138 copies/mL). A negative result must be combined with clinical observations, patient history, and epidemiological information. The expected result is Negative.  Fact Sheet for Patients:  EntrepreneurPulse.com.au  Fact Sheet for Healthcare Providers:  IncredibleEmployment.be  This test is no t yet approved or cleared by the [place] FDA and  has been  authorized for detection and/or diagnosis of SARS-CoV-2 by FDA under an Emergency Use Authorization (EUA). This EUA will remain  in effect (meaning this test can be used) for the duration of the COVID-19 declaration under Section 564(b)(1) of the Act, 21 U.S.C.section 360bbb-3(b)(1), unless the authorization is terminated  or revoked sooner.       Influenza A by PCR NEGATIVE NEGATIVE   Influenza B by PCR NEGATIVE NEGATIVE    Comment: (NOTE) The Xpert Xpress SARS-CoV-2/FLU/RSV plus assay is intended as an aid in the diagnosis of influenza from Nasopharyngeal swab specimens and should not be used as a sole basis for treatment. Nasal washings and aspirates are unacceptable for Xpert Xpress SARS-CoV-2/FLU/RSV testing.  Fact Sheet for Patients: EntrepreneurPulse.com.au  Fact Sheet for Healthcare Providers: IncredibleEmployment.be  This test is not yet approved or cleared by the [place] FDA and has been authorized for detection and/or diagnosis of SARS-CoV-2 by FDA under an Emergency Use Authorization (EUA). This EUA will remain in effect (meaning this test can be used) for the duration of the COVID-19 declaration under Section 564(b)(1) of the Act, 21 U.S.C. section 360bbb-3(b)(1), unless the authorization is terminated or revoked.     Resp Syncytial Virus by PCR NEGATIVE NEGATIVE    Comment: (NOTE) Fact Sheet for Patients: EntrepreneurPulse.com.au  Fact Sheet for Healthcare Providers: IncredibleEmployment.be  This test is not yet approved or cleared by the [place] FDA and has been authorized for detection and/or diagnosis of SARS-CoV-2 by FDA under an Emergency Use Authorization (EUA). This EUA will remain in effect (meaning this test can be used) for the duration of the COVID-19 declaration under Section 564(b)(1) of the Act, 21 U.S.C. section 360bbb-3(b)(1), unless the authorization is  terminated or revoked.  Performed at [facility], [place] [address], [place], [place] [address]   Ethanol     Status: None   Collection Time: 11/09/22 12:39 PM  Result Value Ref Range   Alcohol, Ethyl (B) <10 <10 mg/dL    Comment: (NOTE) Lowest detectable limit for serum alcohol is 10 mg/dL.  For medical purposes only. Performed at [facility], [place] [address] [place], [place] [address]     Current Facility-Administered Medications  Medication Dose  Route Frequency Provider Last Rate Last Admin   acetaminophen (TYLENOL) tablet 650 mg  650 mg Oral Q6H PRN Cardama, Grayce Sessions, MD       benztropine (COGENTIN) tablet 0.5 mg  0.5 mg Oral Daily Karesa Maultsby C, NP       hydrOXYzine (ATARAX) tablet 25 mg  25 mg Oral TID PRN Delfin Gant, NP       paliperidone (INVEGA) 24 hr tablet 6 mg  6 mg Oral Daily Athziry Millican C, NP       traZODone (DESYREL) tablet 50 mg  50 mg Oral QHS PRN Delfin Gant, NP       Current Outpatient Medications  Medication Sig Dispense Refill   acetaminophen (TYLENOL) 325 MG tablet Take 650 mg by mouth every 6 (six) hours as needed for mild pain or moderate pain.     albuterol (VENTOLIN HFA) 108 (90 Base) MCG/ACT inhaler Inhale 2 puffs into the lungs every 4 (four) hours as needed for wheezing or shortness of breath. 18 g 1   ALPRAZolam (XANAX) 1 MG tablet Take 1 mg by mouth 3 (three) times daily as needed for anxiety.     benztropine (COGENTIN) 0.5 MG tablet Take 0.5 mg by mouth at bedtime.     chlorproMAZINE (THORAZINE) 100 MG tablet Take 1 tablet (100 mg total) by mouth at bedtime. (Patient not taking: Reported on 09/10/2022) 30 tablet 0   chlorproMAZINE (THORAZINE) 50 MG tablet Take 1 tablet (50 mg total) by mouth in the morning. (Patient not taking: Reported on 06/18/2022) 30 tablet 0   HYDROcodone-acetaminophen (NORCO) 7.5-325 MG tablet Take 1 tablet by mouth 3 (three) times daily as needed. (Patient  not taking: Reported on 08/17/2022)     INVEGA SUSTENNA 156 MG/ML SUSY injection Inject into the muscle. (Patient not taking: Reported on 08/17/2022)     OLANZapine (ZYPREXA) 5 MG tablet Take 5 mg by mouth at bedtime. (Patient not taking: Reported on 08/17/2022)     paliperidone (INVEGA SUSTENNA) 234 MG/1.5ML injection Inject 234 mg into the muscle once. (Patient not taking: Reported on 08/17/2022)     paliperidone (INVEGA) 3 MG 24 hr tablet Take 3 mg by mouth 2 (two) times daily. (Patient not taking: Reported on 08/17/2022)      Musculoskeletal: Strength & Muscle Tone:  seen in bed lying down Gait & Station:  seen in bed lying down Patient leans:  see above.   Psychiatric Specialty Exam: Presentation  General Appearance:  Casual; Other (comment) (Thin, emaciated)  Eye Contact: None  Speech: Blocked (minimal)  Speech Volume: Normal  Handedness: Right   Mood and Affect  Mood: Anxious  Affect: Blunt   Thought Process  Thought Processes: Disorganized  Descriptions of Associations:Tangential  Orientation:Partial  Thought Content:Illogical; Delusions; Paranoid Ideation  History of Schizophrenia/Schizoaffective disorder:Yes  Duration of Psychotic Symptoms:Greater than six months  Hallucinations:Hallucinations: -- (unable to obtain-refused to answer)  Ideas of Reference:Paranoia  Suicidal Thoughts:Suicidal Thoughts: No  Homicidal Thoughts:Homicidal Thoughts: No   Sensorium  Memory: -- (unable to obtain, patient stopped talking to staff and provider)  Judgment: Poor  Insight: Poor   Executive Functions  Concentration: Fair  Attention Span: Fair  Recall: [employer] of Knowledge: Fair  Language: Good   Psychomotor Activity  Psychomotor Activity: Psychomotor Activity: Normal   Assets  Assets: Housing    Sleep  Sleep: Sleep: Poor   Physical Exam: Physical Exam-Non contributory ROS-Non contributory-not talking, not making  eye contact. Blood pressure (!) 94/58, pulse  68, temperature 98 F (36.7 C), temperature source Oral, resp. rate 16, height 6' (1.829 m), weight 70.3 kg, SpO2 99 %. Body mass index is 21.02 kg/m.  Medical Decision Making:  Patient meets criteria for inpatient Psychiatric hospitalization for safety and stabilization.  Patient will resume his home medications.  We will seek bed placement at any facility with available bed.   Problem 1: Paranoid Schizophrenia  Disposition:  Admit, seek bed placement.  Delfin Gant, NP-PMHNP-BC 11/09/2022 1:36 PM

## 2022-11-10 DIAGNOSIS — F200 Paranoid schizophrenia: Secondary | ICD-10-CM | POA: Diagnosis not present

## 2022-11-10 MED ORDER — PHENOL 1.4 % MT LIQD
1.0000 | OROMUCOSAL | Status: DC | PRN
  Filled 2022-11-10: qty 177

## 2022-11-10 MED ORDER — MENTHOL 3 MG MT LOZG: 1.0000 | LOZENGE | OROMUCOSAL | Status: DC | PRN

## 2022-11-10 MED ORDER — LIDOCAINE VISCOUS HCL 2 % MT SOLN
15.0000 mL | Freq: Once | OROMUCOSAL | Status: DC
  Filled 2022-11-10: qty 15

## 2022-11-10 MED ORDER — LORAZEPAM 1 MG PO TABS
1.0000 mg | ORAL_TABLET | Freq: Three times a day (TID) | ORAL | Status: DC | PRN
  Administered 2022-11-10: 1 mg via ORAL
  Filled 2022-11-10: qty 1

## 2022-11-10 MED ORDER — LORAZEPAM 1 MG PO TABS
1.0000 mg | ORAL_TABLET | Freq: Once | ORAL | Status: AC
  Administered 2022-11-10: 1 mg via ORAL
  Filled 2022-11-10: qty 1

## 2022-11-10 NOTE — Consult Note (Signed)
Patient has been accepted at [facility] in [place].  He has been refusing Medications and refused drinking water believing food and water are poisoned.  Patient is not agitated or aggressive but occasionally refuses to interact with staff.  He will be transported to [facility] in am.

## 2022-11-10 NOTE — ED Notes (Signed)
Pt refused to HR and oral temperature.

## 2022-11-10 NOTE — ED Notes (Signed)
Did not allow to have tempeture checked.

## 2022-11-10 NOTE — ED Notes (Signed)
Patient was convinced to take his med    he was getting agitated

## 2022-11-10 NOTE — ED Notes (Signed)
Patient was ordered soft food due to not having any teeth   he was ordered mac and cheese, potatoes with gravy, green beans and 2 soft cookies    he has ate the best part of his meal tray for dinner

## 2022-11-10 NOTE — ED Notes (Signed)
Patient refused temperature check

## 2022-11-10 NOTE — ED Notes (Addendum)
Pt refused all medication administration and stated he "needs someone to get the slime out of my mouth". Pt stated "I don't need any meds, all of my medication is in a paper towel".  Pt refused to drink any water, eat any food or allow staff to do anything. He was not agitated or combative, just uncooperative.

## 2022-11-10 NOTE — Group Note (Unsigned)
Date:  11/10/2022 Time:  10:23 AM  Group Topic/Focus:  Goals Group:   The focus of this group is to help patients establish daily goals to achieve during treatment and discuss how the patient can incorporate goal setting into their daily lives to aide in recovery. Orientation:   The focus of this group is to educate the patient on the purpose and policies of crisis stabilization and provide a format to answer questions about their admission.  The group details unit policies and expectations of patients while admitted.     Participation Level:  {BHH PARTICIPATION HD:996081  Participation Quality:  {BHH PARTICIPATION QUALITY:22265}  Affect:  {BHH AFFECT:22266}  Cognitive:  {BHH COGNITIVE:22267}  Insight: {BHH Insight2:20797}  Engagement in Group:  {BHH ENGAGEMENT IN JY:3131603  Modes of Intervention:  {BHH MODES OF INTERVENTION:22269}  Additional Comments:  ***  [name] 11/10/2022, 10:23 AM

## 2022-11-10 NOTE — ED Provider Notes (Signed)
Emergency Medicine Observation Re-evaluation Note  [name] is a 52 y.o. male, seen on rounds today.  Pt initially presented to the ED for complaints of Psychiatric Evaluation Currently, the patient is resting.  Physical Exam  BP 99/60 (BP Location: Left Arm)   Pulse (!) 59   Temp 97.7 F (36.5 C) (Oral)   Resp 16   Ht 1.829 m (6')   Wt 70.3 kg   SpO2 98%   BMI 21.02 kg/m  Physical Exam General: nad Cardiac: regular rate Lungs: normal effort Psych:   ED Course / MDM  EKG:   I have reviewed the labs performed to date as well as medications administered while in observation.  Recent changes in the last 24 hours include intermittent compliance with treatment, still delusional.  Plan  Current plan is for inpt admission.    Dorie Rank, MD 11/10/22 [phone]

## 2022-11-10 NOTE — Progress Notes (Addendum)
Patient requesting medication to help his throat irritation. Notified Dr. Tinnie Gens. New order for lidocaine PO. Patient refused and stated that lidocaine can only be given topically.

## 2022-11-10 NOTE — Progress Notes (Signed)
Pt was accepted to [facility] 11/11/2022, pending negative covid faxed to [phone]. Bed assignment: Main campus  Pt meets inpatient criteria per Charmaine Downs, NP  Attending Physician will be Leta Jungling, MD  Report can be called to: [phone] (this is a pager, please leave call-back number when giving report)  Pt can arrive after 8 AM  Care Team Notified: Charmaine Downs, NP and Viona Gilmore, Paramedic  [place], [place]  11/10/2022 12:23 PM

## 2022-11-10 NOTE — ED Notes (Signed)
Patient became very agitated, possibly due to another patient on the same hall, being loud and bothersome  message was sent to Dr Tinnie Gens and ativan was ordered and he was bargained with to take his meds for and extra sprite

## 2022-11-10 NOTE — Progress Notes (Signed)
LCSW Progress Note  [id number]   Mali A Belter  11/10/2022  10:24 AM  Description:   Inpatient Psychiatric Referral  Patient was recommended inpatient per Charmaine Downs, NP. There are no available beds at [facility]. Patient was referred to the following facilities:   Destination  Service Provider Address Phone Fax  [facility]  [place], [place] [address] [place]  [facility]  [address] [place] [address] [place]  [facility]  [address], [place] [address] [phone]  [facility]  [place], [place] [address] [phone]  [facility]  [id number] [place] [address] [phone] [facility]  [address] [place], [place] [address] [phone] [address] [place] [address] [place]  [facility]  [address] [place] [address] [phone]  [facility]  [address], [place] [address] [phone]  [address] [place] [address] [phone]  [facility]  [place] [address] [phone]  [facility]  [address] [place] [address] [phone] [facility]  [place] [address] [phone] [facility]  [address] [place] [address] [place]  [address] [place] [address] [phone] [place] [facility]  [address], [place] [address]  [phone]  [facility]  [address], [place] [address] [phone]  [facility]  [place], [place] [address] [place]  [facility]  [address] [place] [address] [phone]  [place]  [address] [place] [address] [phone]  [place]  [address] [place] [address] [phone]  [facility]  [address], [place] [address] [phone]  [facility]  [address] [place] [address] [phone]  [place], [place] [phone] [place]  [place], [place] [address] [phone] [place]  [address], [place]  [address] [phone]    Situation ongoing, CSW to continue following and update chart as more information becomes available.      Denna Haggard, [place]  11/10/2022 10:24 AM

## 2022-11-11 DIAGNOSIS — F200 Paranoid schizophrenia: Secondary | ICD-10-CM | POA: Diagnosis not present

## 2022-11-11 NOTE — ED Notes (Signed)
Patient off unit to facility per provider. Patient alert, calm, cooperative, no s/s of distress. Patient discharge information and belongings given to [facility] for transport. Patient ambulatory off unit, escorted and transported by sheriff.

## 2022-11-11 NOTE — ED Provider Notes (Signed)
Emergency Medicine Observation Re-evaluation Note  [name] is a 52 y.o. male, seen on rounds today.  Pt initially presented to the ED for complaints of Psychiatric Evaluation Currently, the patient is resting comfortably but refusing vital checks and interventions by staff including medications. Required ativan last night.   Physical Exam  BP 102/64 (BP Location: Left Arm)   Pulse (!) 52   Temp 97.7 F (36.5 C) (Oral)   Resp 16   Ht 6' (1.829 m)   Wt 70.3 kg   SpO2 100%   BMI 21.02 kg/m  Physical Exam General: Resting comfortably in stretcher Lungs: Normal work of breathing Psych: Calm  ED Course / MDM  EKG:   I have reviewed the labs performed to date as well as medications administered while in observation.  Recent changes in the last 24 hours include required Ativan for agitation. Accepted to [facility] in [place].   Plan  Current plan is for transfer to [facility].  Patient accepted this morning by Dr Myriam Forehand, MD 11/11/22 [phone]

## 2023-01-14 NOTE — Progress Notes (Signed)
Viral panel including COVID was ordered per protocol in anticipation of [facility] admission in order to guide placement.

## 2023-01-21 ENCOUNTER — Ambulatory Visit: Payer: 59 | Admitting: Critical Care Medicine

## 2023-01-21 NOTE — Progress Notes (Deleted)
New Patient Office Visit  Subjective    Patient ID: [name], male    DOB: May 12, 1971  Age: 52 y.o. MRN: [id number]  CC: No chief complaint on file.   HPI [name] Pier presents to establish care ***  Outpatient Encounter Medications as of 01/21/2023  Medication Sig   acetaminophen (TYLENOL) 325 MG tablet Take 650 mg by mouth every 6 (six) hours as needed for mild pain or moderate pain.   albuterol (VENTOLIN HFA) 108 (90 Base) MCG/ACT inhaler Inhale 2 puffs into the lungs every 4 (four) hours as needed for wheezing or shortness of breath.   ALPRAZolam (XANAX) 1 MG tablet Take 1 mg by mouth 3 (three) times daily as needed for anxiety.   benztropine (COGENTIN) 0.5 MG tablet Take 0.5 mg by mouth at bedtime.   chlorproMAZINE (THORAZINE) 100 MG tablet Take 1 tablet (100 mg total) by mouth at bedtime. (Patient not taking: Reported on 09/10/2022)   chlorproMAZINE (THORAZINE) 50 MG tablet Take 1 tablet (50 mg total) by mouth in the morning. (Patient not taking: Reported on 06/18/2022)   HYDROcodone-acetaminophen (NORCO) 7.5-325 MG tablet Take 1 tablet by mouth 3 (three) times daily as needed. (Patient not taking: Reported on 08/17/2022)   INVEGA SUSTENNA 156 MG/ML SUSY injection Inject into the muscle. (Patient not taking: Reported on 08/17/2022)   OLANZapine (ZYPREXA) 5 MG tablet Take 5 mg by mouth at bedtime. (Patient not taking: Reported on 08/17/2022)   paliperidone (INVEGA SUSTENNA) 234 MG/1.5ML injection Inject 234 mg into the muscle once. (Patient not taking: Reported on 08/17/2022)   paliperidone (INVEGA) 3 MG 24 hr tablet Take 3 mg by mouth 2 (two) times daily. (Patient not taking: Reported on 08/17/2022)   No facility-administered encounter medications on file as of 01/21/2023.    Past Medical History:  Diagnosis Date   Asthma    CHF (congestive heart failure) (HCC)    Chronic back pain    COPD (chronic obstructive pulmonary disease) (HCC)    Knee pain, chronic    Migraines     Schizophrenia, schizo-affective (HCC)     Past Surgical History:  Procedure Laterality Date   KNEE SURGERY     four times   WISDOM TOOTH EXTRACTION      Family History  Problem Relation Age of Onset   Hypertension Father     Social History   Socioeconomic History   Marital status: Widowed    Spouse name: Not on file   Number of children: Not on file   Years of education: Not on file   Highest education level: Not on file  Occupational History   Occupation: on disability  Tobacco Use   Smoking status: Former    Packs/day: 1.00    Years: 33.00    Additional pack years: 0.00    Total pack years: 33.00    Types: Cigarettes   Smokeless tobacco: Never  Vaping Use   Vaping Use: Never used  Substance and Sexual Activity   Alcohol use: Yes    Comment: occ   Drug use: No    Comment: Pt denied   Sexual activity: Not Currently  Other Topics Concern   Not on file  Social History Narrative   Pt is a widower.  He lives alone, and he receives SSI Disability.  Pt was followed by Dr. Jannifer Franklin, but he has not seen Dr. Jannifer Franklin in several months.   Social Determinants of Health   Financial Resource Strain: Not on file  Food  Insecurity: Not on file  Transportation Needs: Not on file  Physical Activity: Not on file  Stress: Not on file  Social Connections: Not on file  Intimate Partner Violence: Not on file    ROS      Objective    There were no vitals taken for this visit.  Physical Exam  {Labs (Optional):23779}    Assessment & Plan:   Problem List Items Addressed This Visit   None   No follow-ups on file.   Shan Levans, MD

## 2023-01-30 ENCOUNTER — Other Ambulatory Visit: Payer: Self-pay

## 2023-01-30 ENCOUNTER — Encounter (HOSPITAL_COMMUNITY): Payer: Self-pay | Admitting: Emergency Medicine

## 2023-01-30 ENCOUNTER — Emergency Department (HOSPITAL_COMMUNITY)
Admission: EM | Admit: 2023-01-30 | Discharge: 2023-02-02 | Disposition: A | Discharge status: Other Acute Inpatient Hospital | Payer: 59 | Attending: Emergency Medicine | Admitting: Emergency Medicine

## 2023-01-30 DIAGNOSIS — Z87891 Personal history of nicotine dependence: Secondary | ICD-10-CM | POA: Diagnosis not present

## 2023-01-30 DIAGNOSIS — J449 Chronic obstructive pulmonary disease, unspecified: Secondary | ICD-10-CM | POA: Diagnosis not present

## 2023-01-30 DIAGNOSIS — F250 Schizoaffective disorder, bipolar type: Secondary | ICD-10-CM | POA: Diagnosis not present

## 2023-01-30 DIAGNOSIS — E876 Hypokalemia: Secondary | ICD-10-CM | POA: Insufficient documentation

## 2023-01-30 DIAGNOSIS — F200 Paranoid schizophrenia: Secondary | ICD-10-CM | POA: Diagnosis not present

## 2023-01-30 DIAGNOSIS — I509 Heart failure, unspecified: Secondary | ICD-10-CM | POA: Insufficient documentation

## 2023-01-30 DIAGNOSIS — R4689 Other symptoms and signs involving appearance and behavior: Secondary | ICD-10-CM

## 2023-01-30 DIAGNOSIS — Z1152 Encounter for screening for COVID-19: Secondary | ICD-10-CM | POA: Insufficient documentation

## 2023-01-30 DIAGNOSIS — F209 Schizophrenia, unspecified: Secondary | ICD-10-CM

## 2023-01-30 LAB — CBC
HCT: 47.1 % (ref 39.0–52.0)
Hemoglobin: 15.9 g/dL (ref 13.0–17.0)
MCH: 29.1 pg (ref 26.0–34.0)
MCHC: 33.8 g/dL (ref 30.0–36.0)
MCV: 86.1 fL (ref 80.0–100.0)
Platelets: 223 10*3/uL (ref 150–400)
RBC: 5.47 MIL/uL (ref 4.22–5.81)
RDW: 12.5 % (ref 11.5–15.5)
WBC: 7.2 10*3/uL (ref 4.0–10.5)
nRBC: 0 % (ref 0.0–0.2)

## 2023-01-30 LAB — COMPREHENSIVE METABOLIC PANEL
ALT: 17 U/L (ref 0–44)
AST: 21 U/L (ref 15–41)
Albumin: 4.7 g/dL (ref 3.5–5.0)
Alkaline Phosphatase: 49 U/L (ref 38–126)
Anion gap: 13 (ref 5–15)
BUN: 13 mg/dL (ref 6–20)
CO2: 25 mmol/L (ref 22–32)
Calcium: 11.1 mg/dL — ABNORMAL HIGH (ref 8.9–10.3)
Chloride: 101 mmol/L (ref 98–111)
Creatinine, Ser: 0.99 mg/dL (ref 0.61–1.24)
GFR, Estimated: >60 mL/min (ref >60)
Glucose, Bld: 146 mg/dL — ABNORMAL HIGH (ref 70–99)
Potassium: 3.3 mmol/L — ABNORMAL LOW (ref 3.5–5.1)
Sodium: 139 mmol/L (ref 135–145)
Total Bilirubin: 1.4 mg/dL — ABNORMAL HIGH (ref 0.3–1.2)
Total Protein: 8 g/dL (ref 6.5–8.1)

## 2023-01-30 LAB — SALICYLATE LEVEL: Salicylate Lvl: <7 mg/dL — ABNORMAL LOW (ref 7.0–30.0)

## 2023-01-30 LAB — ACETAMINOPHEN LEVEL: Acetaminophen (Tylenol), Serum: <10 ug/mL — ABNORMAL LOW (ref 10–30)

## 2023-01-30 LAB — TSH: TSH: 0.875 u[IU]/mL (ref 0.350–4.500)

## 2023-01-30 LAB — ETHANOL: Alcohol, Ethyl (B): <10 mg/dL (ref <10)

## 2023-01-30 MED ORDER — OLANZAPINE 5 MG PO TABS
5.0000 mg | ORAL_TABLET | Freq: Every day | ORAL | Status: DC
  Filled 2023-01-30: qty 1

## 2023-01-30 MED ORDER — ZIPRASIDONE MESYLATE 20 MG IM SOLR: 10.0000 mg | Freq: Once | INTRAMUSCULAR | Status: DC

## 2023-01-30 MED ORDER — ALPRAZOLAM 0.5 MG PO TABS
1.0000 mg | ORAL_TABLET | Freq: Three times a day (TID) | ORAL | Status: DC | PRN
  Administered 2023-01-30 – 2023-02-01 (×2): 1 mg via ORAL
  Filled 2023-01-30 (×3): qty 2

## 2023-01-30 MED ORDER — PALIPERIDONE ER 3 MG PO TB24
3.0000 mg | ORAL_TABLET | Freq: Two times a day (BID) | ORAL | Status: DC
  Administered 2023-01-30 – 2023-02-02 (×3): 3 mg via ORAL
  Filled 2023-01-30 (×6): qty 1

## 2023-01-30 MED ORDER — MIDAZOLAM HCL 2 MG/2ML IJ SOLN
5.0000 mg | Freq: Once | INTRAMUSCULAR | Status: AC
  Administered 2023-01-30: 5 mg via INTRAMUSCULAR
  Filled 2023-01-30: qty 6

## 2023-01-30 MED ORDER — OLANZAPINE 5 MG PO TBDP
5.0000 mg | ORAL_TABLET | Freq: Every day | ORAL | Status: DC
  Administered 2023-01-30 – 2023-02-01 (×2): 5 mg via ORAL
  Filled 2023-01-30: qty 1

## 2023-01-30 MED ORDER — BENZTROPINE MESYLATE 0.5 MG PO TABS
0.5000 mg | ORAL_TABLET | Freq: Every day | ORAL | Status: DC
  Administered 2023-01-30 – 2023-02-01 (×2): 0.5 mg via ORAL
  Filled 2023-01-30 (×2): qty 1

## 2023-01-30 MED ORDER — ALBUTEROL SULFATE HFA 108 (90 BASE) MCG/ACT IN AERS: 2.0000 | INHALATION_SPRAY | RESPIRATORY_TRACT | Status: DC | PRN

## 2023-01-30 MED ORDER — OLANZAPINE 10 MG IM SOLR: 5.0000 mg | Freq: Three times a day (TID) | INTRAMUSCULAR | Status: DC | PRN

## 2023-01-30 NOTE — ED Notes (Signed)
Patient refused EKG at this time. Will try again later. 

## 2023-01-30 NOTE — ED Provider Notes (Signed)
Patient medical screening exam due to increased agitation.  He is under IVC by [employer].  Given Versed for agitation as he has allergies to Haldol and Geodon.  Labs initiated   Lorre Nick, MD 01/30/23 [phone]

## 2023-01-30 NOTE — Consult Note (Addendum)
BH ED ASSESSMENT   Reason for Consult: Psych Consult Referring Physician: Dr. Anitra Lauth Patient Identification: [name] MRN:  [id number] ED Chief Complaint: Schizoaffective disorder, bipolar type [facility])  Diagnosis:  Principal Problem:   Schizoaffective disorder, bipolar type (HCC) Active Problems:   Schizophrenia, paranoid type [facility])   ED Assessment Time Calculation: Start Time: 1830 Stop Time: 1845 Total Time in Minutes (Assessment Completion): 15   Subjective:  Patient BIB [employer] with aggressive behavior. Per report landlord called adult protective services d/t patient losing a lot of weight. Adult protective services got kicked on her chest when she's talking to the patient.     HPI: [name], 52 y.o., male patient seen face to face by this provider, consulted with Dr. Lucianne Muss; and chart reviewed on 01/30/23.  On evaluation [name] [place] patient is standing in his doorway of the home, with his arms crossed.  Provider asked patient if she could come in and talk with him, he stated "you can talk, does not mean I have to listen."  Provider called him [name], patient became irritable that do not call me [name] that is not my name.  Provider asked him his name and he stated, "I do not have to tell you ".  Upon approach patient appears irritable, gives one-word to questions.  Patient stated he will be upset to if you were the victim, and people just came into your home and started attacking you.  Provider asked who attacked him and he stated "the damn police."  Patient feels that the police are out to get him, states he was not bothering anybody and minding his own business.  Provider discussed with patient, that everyone is just doing their job and making sure he was safe, patient said "well everyone just needs to mind their business". Pt gives brief answers to all questions and appears uninterested in participating.   Patient is alert, unable to assess orientation, as patient refuses to  participate at times in evaluation.  Patient does state that his name is not [name] and will not tell provider what his name is. Patient mood is irritable, with flat affect.  His thought process is disorganized, and irrelevant; There is no indication that he is currently responding to internal/external stimuli or experiencing delusional thought content.  Patient denies suicidal/self-harm/homicidal ideation.  Unsure if patient is compliant with his home medication. No UDS given, patient is not forthcoming to if he uses illicit substances or ETOH. Patient meets criteria for inpatient Psychiatric hospitalization for safety and stabilization.  Patient will resume his home medications.  We will seek bed placement at any facility with available bed.   Attempted to call his mother [name], left the HIPAA compliant voicemail due to no answer.   Past Psychiatric History: hx of Schizophrenia /Schizoaffective disorder, anxiety, suicide attempts.  Multiple inpatient Psychiatric hospitalization.  Multiple suicide attempts last was 2-3 years ago per mom.  He was with [facility] team but was dropped  when he was in the hospital.  Risk to Self or Others: Risk to Self: Yes Risk to Others: Yes  Prior Inpatient Therapy:  Yes  Prior Outpatient Therapy:  No   [place] Scale:  Flowsheet Row ED from 11/08/2022 in [facility] Emergency Department at [facility] ED from 09/09/2022 in [facility] Emergency Department at [facility] ED from 08/15/2022 in [facility] Emergency Department at [facility]  C-SSRS RISK CATEGORY No Risk No Risk No Risk  AIMS:  , , ,  ,   ASAM:    Substance Abuse:     Past Medical History:  Past Medical History:  Diagnosis Date   Asthma    CHF (congestive heart failure) (HCC)    Chronic back pain    COPD (chronic obstructive pulmonary disease) (HCC)    Knee pain, chronic    Migraines    Schizophrenia, schizo-affective (HCC)     Past Surgical  History:  Procedure Laterality Date   KNEE SURGERY     four times   WISDOM TOOTH EXTRACTION     Family History:  Family History  Problem Relation Age of Onset   Hypertension Father     Social History:  Social History   Substance and Sexual Activity  Alcohol Use Yes   Comment: occ     Social History   Substance and Sexual Activity  Drug Use No   Comment: Pt denied    Social History   Socioeconomic History   Marital status: Widowed    Spouse name: Not on file   Number of children: Not on file   Years of education: Not on file   Highest education level: Not on file  Occupational History   Occupation: on disability  Tobacco Use   Smoking status: Former    Packs/day: 1.00    Years: 33.00    Additional pack years: 0.00    Total pack years: 33.00    Types: Cigarettes   Smokeless tobacco: Never  Vaping Use   Vaping Use: Never used  Substance and Sexual Activity   Alcohol use: Yes    Comment: occ   Drug use: No    Comment: Pt denied   Sexual activity: Not Currently  Other Topics Concern   Not on file  Social History Narrative   Pt is a widower.  He lives alone, and he receives SSI Disability.  Pt was followed by Dr. Jannifer Franklin, but he has not seen Dr. Jannifer Franklin in several months.   Social Determinants of Health   Financial Resource Strain: Not on file  Food Insecurity: Not on file  Transportation Needs: Not on file  Physical Activity: Not on file  Stress: Not on file  Social Connections: Not on file   Additional Social History:    Allergies:   Allergies  Allergen Reactions   Amoxicillin Diarrhea    Has patient had a PCN reaction causing immediate rash, facial/tongue/throat swelling, SOB or lightheadedness with hypotension: No Has patient had a PCN reaction causing severe rash involving mucus membranes or skin necrosis: No Has patient had a PCN reaction that required hospitalization: No Has patient had a PCN reaction occurring within the last 10 years:  No If all of the above answers are "NO", then may proceed with Cephalosporin use. Caused hallucinations and pain  Pt did not elaborate   Dextromethorphan-Guaifenesin Other (See Comments)    Bleeding from the brain    Haloperidol Other (See Comments)    Partial  paralysis   Haloperidol Lactate    Meloxicam Other (See Comments)    unspecified   Nsaids Other (See Comments)    Rectal bleeding   Quetiapine Other (See Comments)    Psychosis with high dose (600mg )   Sudafed [Pseudoephedrine Hcl] Other (See Comments)    Hallucinations   Ziprasidone Other (See Comments)    unspecified   Prednisone Other (See Comments) and Anxiety    Psychotic episodes  Patient states it makes me psychotic    Labs:  Results for orders placed or performed during the hospital encounter of 01/30/23 (from the past 48 hour(s))  Comprehensive metabolic panel     Status: Abnormal   Collection Time: 01/30/23  4:17 PM  Result Value Ref Range   Sodium 139 135 - 145 mmol/L   Potassium 3.3 (L) 3.5 - 5.1 mmol/L   Chloride 101 98 - 111 mmol/L   CO2 25 22 - 32 mmol/L   Glucose, Bld 146 (H) 70 - 99 mg/dL    Comment: Glucose reference range applies only to samples taken after fasting for at least 8 hours.   BUN 13 6 - 20 mg/dL   Creatinine, Ser 7.82 0.61 - 1.24 mg/dL   Calcium 95.6 (H) 8.9 - 10.3 mg/dL   Total Protein 8.0 6.5 - 8.1 g/dL   Albumin 4.7 3.5 - 5.0 g/dL   AST 21 15 - 41 U/L   ALT 17 0 - 44 U/L   Alkaline Phosphatase 49 38 - 126 U/L   Total Bilirubin 1.4 (H) 0.3 - 1.2 mg/dL   GFR, Estimated >21 >30 mL/min    Comment: (NOTE) Calculated using the CKD-EPI Creatinine Equation (2021)    Anion gap 13 5 - 15    Comment: Performed at [facility], [address], [place], [place] [address]  Ethanol     Status: None   Collection Time: 01/30/23  4:17 PM  Result Value Ref Range   Alcohol, Ethyl (B) <10 <10 mg/dL    Comment: (NOTE) Lowest detectable limit for serum alcohol is 10  mg/dL.  For medical purposes only. Performed at [facility], [address] [place], [place] [address]   Salicylate level     Status: Abnormal   Collection Time: 01/30/23  4:17 PM  Result Value Ref Range   Salicylate Lvl <7.0 (L) 7.0 - 30.0 mg/dL    Comment: Performed at [facility], [address] [place], [place] [address]  Acetaminophen level     Status: Abnormal   Collection Time: 01/30/23  4:17 PM  Result Value Ref Range   Acetaminophen (Tylenol), Serum <10 (L) 10 - 30 ug/mL    Comment: (NOTE) Therapeutic concentrations vary significantly. A range of 10-30 ug/mL  may be an effective concentration for many patients. However, some  are best treated at concentrations outside of this range. Acetaminophen concentrations >150 ug/mL at 4 hours after ingestion  and >50 ug/mL at 12 hours after ingestion are often associated with  toxic reactions.  Performed at [facility], [address], [place], [place] [address]   cbc     Status: None   Collection Time: 01/30/23  4:17 PM  Result Value Ref Range   WBC 7.2 4.0 - 10.5 K/uL   RBC 5.47 4.22 - 5.81 MIL/uL   Hemoglobin 15.9 13.0 - 17.0 g/dL   HCT 01.0 27.2 - 53.6 %   MCV 86.1 80.0 - 100.0 fL   MCH 29.1 26.0 - 34.0 pg   MCHC 33.8 30.0 - 36.0 g/dL   RDW 64.4 03.4 - 74.2 %   Platelets 223 150 - 400 K/uL   nRBC 0.0 0.0 - 0.2 %    Comment: Performed at [facility], [address] [place], [place] [address]  TSH     Status: None   Collection Time: 01/30/23  4:17 PM  Result Value Ref Range   TSH 0.875 0.350 - 4.500 uIU/mL    Comment: Performed by a 3rd  Generation assay with a functional sensitivity of <=0.01 uIU/mL. Performed at [facility], [address] [place], [place] [address]     No current facility-administered medications for this encounter.   Current Outpatient Medications  Medication Sig Dispense Refill    acetaminophen (TYLENOL) 325 MG tablet Take 650 mg by mouth every 6 (six) hours as needed for mild pain or moderate pain.     albuterol (VENTOLIN HFA) 108 (90 Base) MCG/ACT inhaler Inhale 2 puffs into the lungs every 4 (four) hours as needed for wheezing or shortness of breath. 18 g 1   ALPRAZolam (XANAX) 1 MG tablet Take 1 mg by mouth 3 (three) times daily as needed for anxiety.     benztropine (COGENTIN) 0.5 MG tablet Take 0.5 mg by mouth at bedtime.     chlorproMAZINE (THORAZINE) 100 MG tablet Take 1 tablet (100 mg total) by mouth at bedtime. (Patient not taking: Reported on 09/10/2022) 30 tablet 0   chlorproMAZINE (THORAZINE) 50 MG tablet Take 1 tablet (50 mg total) by mouth in the morning. (Patient not taking: Reported on 06/18/2022) 30 tablet 0   HYDROcodone-acetaminophen (NORCO) 7.5-325 MG tablet Take 1 tablet by mouth 3 (three) times daily as needed. (Patient not taking: Reported on 08/17/2022)     INVEGA SUSTENNA 156 MG/ML SUSY injection Inject into the muscle. (Patient not taking: Reported on 08/17/2022)     OLANZapine (ZYPREXA) 5 MG tablet Take 5 mg by mouth at bedtime. (Patient not taking: Reported on 08/17/2022)     paliperidone (INVEGA SUSTENNA) 234 MG/1.5ML injection Inject 234 mg into the muscle once. (Patient not taking: Reported on 08/17/2022)     paliperidone (INVEGA) 3 MG 24 hr tablet Take 3 mg by mouth 2 (two) times daily. (Patient not taking: Reported on 08/17/2022)      Musculoskeletal: Strength & Muscle Tone: within normal limits Gait & Station: normal Patient leans: N/A   Psychiatric Specialty Exam: Presentation  General Appearance:  Disheveled  Eye Contact: Fleeting  Speech: Garbled (patient does not have any teeth in his mouth)  Speech Volume: Normal  Handedness: Right   Mood and Affect  Mood: Irritable  Affect: Flat   Thought Process  Thought Processes: Disorganized  Descriptions of Associations:Tangential  Orientation:Partial  Thought  Content:Illogical; Tangential  History of Schizophrenia/Schizoaffective disorder:Yes  Duration of Psychotic Symptoms:Greater than six months  Hallucinations:Hallucinations: None  Ideas of Reference:Paranoia  Suicidal Thoughts:Suicidal Thoughts: No  Homicidal Thoughts:Homicidal Thoughts: No   Sensorium  Memory: Immediate Poor; Remote Good  Judgment: Impaired  Insight: Lacking   Executive Functions  Concentration: Poor  Attention Span: Poor  Recall: Poor  Fund of Knowledge: Poor  Language: Poor   Psychomotor Activity  Psychomotor Activity:Psychomotor Activity: Normal   Assets  Assets: Communication Skills; Social Support; Desire for Improvement    Sleep  Sleep:Sleep: Poor   Physical Exam: Physical Exam Vitals and nursing note reviewed. Exam conducted with a chaperone present.  Psychiatric:        Attention and Perception: He is inattentive.        Mood and Affect: Affect is flat and inappropriate.        Behavior: Behavior is agitated.        Thought Content: Thought content is paranoid.        Cognition and Memory: Cognition is impaired.        Judgment: Judgment is impulsive and inappropriate.     Comments: Speech is garbled due to patient not having his teeth in mouth.  Review of Systems  Constitutional: Negative.   Psychiatric/Behavioral:         Paranoia    Blood pressure (!) 147/92, pulse 64, temperature (!) 97.5 F (36.4 C), temperature source Oral, resp. rate 18, SpO2 98 %. There is no height or weight on file to calculate BMI.    Treatment Plan Summary: Daily contact with patient to assess and evaluate symptoms and progress in treatment, Medication management, and Plan. Patient IVC for aggressive behavior and there appears to be a concern of decompensated schizophrenia, and he requires admission to hospital for stabilization and treatment.  Patient home medications will be continued. EKG and UDS needed.   Disposition:  Recommend psychiatric Inpatient admission. Supportive therapy provided about ongoing stressors.  Patient should be on 1:1 precautions until can be safely transferred to Psychiatry.    Alona Bene, PMHNP 01/30/2023 6:58 PM

## 2023-01-30 NOTE — ED Notes (Signed)
Versed given accompanied by [employer] and security. Patient remain calm and cooperative during blood draw. Diet coke and graham cracker given.
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
## 2023-01-30 NOTE — Progress Notes (Signed)
Patient has been denied by [facility] due to no appropriate beds available. Patient meets BH inpatient criteria per Alona Bene, NP. Patient has been to the following facilities:   [facility] Pending - Request [address] [place] [place] [phone]--ZHYQM-VHQI [facility] Pending - Request [address] [place] [phone]--QIHKV-QQV [facility] Pending - Request [phone] [place] [phone]--TDDUK-GURKY [facility] Pending - Request [address], [place] [phone]--IRSWN-IOEVO [facility] Pending - Request SentN/[address] [place] [phone]--[facility] Pending - Request [address] [place] [phone]--[facility] Pending - Request [address] [place] [phone]--KDTOI-ZTIWPYKD [facility] Pending - Request SentN/[address] [place] [place] [phone]--XBDZH-[facility] Pending - Request [address] [place] [phone]--YCXKG-YJEHUDJ [facility] Pending - Request SentN[address] [place] [facility] [phone]--[facility] Pending - Request Sent[address] [place] [phone]--  Damita Dunnings, MSW, LCSW-A  7:54 PM 01/30/2023

## 2023-01-31 DIAGNOSIS — F250 Schizoaffective disorder, bipolar type: Secondary | ICD-10-CM | POA: Diagnosis not present

## 2023-01-31 MED ORDER — LORAZEPAM 2 MG/ML IJ SOLN
2.0000 mg | Freq: Once | INTRAMUSCULAR | Status: AC
  Administered 2023-01-31: 2 mg via INTRAMUSCULAR
  Filled 2023-01-31: qty 1

## 2023-01-31 MED ORDER — DIPHENHYDRAMINE HCL 50 MG/ML IJ SOLN
50.0000 mg | Freq: Once | INTRAMUSCULAR | Status: AC
  Administered 2023-01-31: 50 mg via INTRAMUSCULAR
  Filled 2023-01-31: qty 1

## 2023-01-31 NOTE — ED Notes (Signed)
Pt asleep resting unlabored breathing

## 2023-01-31 NOTE — ED Notes (Signed)
Pt talking loudly to themselves in their rm.

## 2023-01-31 NOTE — ED Notes (Signed)
Pt resting in bed with no acute distress noted at this time.  

## 2023-01-31 NOTE — ED Provider Notes (Addendum)
Emergency Medicine Observation Re-evaluation Note  [name] is a 52 y.o. male, seen on rounds today.  Pt initially presented to the ED for complaints of Aggressive Behavior and IVC Currently, the patient is resting.  Physical Exam  BP (!) 83/83 (BP Location: Right Arm) Comment: Pt lying on the side and refused to sit up, RN aware  Pulse (!) 53   Temp (!) 97.3 F (36.3 C) (Axillary)   Resp 18   SpO2 97%  Physical Exam General: resting comfortably, NAD Lungs: normal WOB Psych: currently calm and resting  ED Course / MDM  EKG:   I have reviewed the labs performed to date as well as medications administered while in observation.  Recent changes in the last 24 hours include none.  Plan  Current plan is for inpatient psychiatric admission, TTS is following.  847: Patient combative, refusing oral meds, walking out of his room, agitated.  Unable to be verbally de-escalated or redirected. Will order IM meds.    Rozelle Logan, DO 01/31/23 0724    Rozelle Logan, DO 01/31/23 [phone]

## 2023-01-31 NOTE — ED Notes (Signed)
Pt walking out of rm and attempts to redirect resulted in pt becoming increasingly agitated. Attempted to give pt their PRN Xanax and pt refused.

## 2023-01-31 NOTE — ED Notes (Signed)
Pt uncooperative and aggressive with staff

## 2023-01-31 NOTE — ED Notes (Signed)
Pt keep walking out of his room. Uncooperative with staff RN notifies of pt behaviors

## 2023-01-31 NOTE — ED Notes (Signed)
Pt comfortable resting fine asleep unlabored breathing

## 2023-01-31 NOTE — ED Notes (Signed)
Pt pushing the door open and screaming. Pt told to stay in room.

## 2023-01-31 NOTE — Consult Note (Signed)
  Attempt to perform daily evaluation has failed as patient was agitated earlier in the shift wanting to leave and go home.  He refused all oral Medications and was medicated by givening him injectable Ativan and Benadryl.  Will attempt assessment later.

## 2023-01-31 NOTE — Progress Notes (Signed)
Patient has been denied by [facility] due to no appropriate beds available. Patient meets BH inpatient criteria per [name] Bene, NP. Patient has been faxed out to the following facilities:   [facility] Pending - Request [address] [place] [phone]--ZHYQM-VHQI [facility] Pending - Request [address], [place] [phone]--[facility] P[facility] - Request [phone] [address] [place] [phone]--TDDUK-GURKY [facility] Pending - Request [address], [place] [phone]--IRSWN-IOEVO [facility] Pending - Request SentN/[address] [place] [phone]--[facility] Pending - Request [address] [place] [phone]--[facility] Pending - Request [address] [place] [phone]--KDTOI-ZTIWPYKD [facility] Pending - Request SentN/[address] [place] [phone]--XBDZH-GD[facility] Pending - Request [address]N/[address], [place] [phone]--YCXKG-YJEHUD[facility] Pending - Request [address] [place] [facility] [phone]--[facility] Pending - Request SentN[address] [place] [phone]--  Damita Dunnings, MSW, LCSW-A  2:24 PM 01/31/2023

## 2023-01-31 NOTE — ED Notes (Signed)
Unable to take vitals pt refused RN notify
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
## 2023-01-31 NOTE — ED Notes (Signed)
Pt trying again to walk out of his room pushing the door and screaming curses at staff

## 2023-02-01 DIAGNOSIS — F250 Schizoaffective disorder, bipolar type: Secondary | ICD-10-CM | POA: Diagnosis not present

## 2023-02-01 LAB — RAPID URINE DRUG SCREEN, HOSP PERFORMED
Amphetamines: NOT DETECTED
Barbiturates: NOT DETECTED
Benzodiazepines: POSITIVE — AB
Cocaine: NOT DETECTED
Opiates: NOT DETECTED
Tetrahydrocannabinol: NOT DETECTED

## 2023-02-01 LAB — SARS CORONAVIRUS 2 BY RT PCR: SARS Coronavirus 2 by RT PCR: NEGATIVE

## 2023-02-01 MED ORDER — DIPHENHYDRAMINE HCL 50 MG/ML IJ SOLN
50.0000 mg | Freq: Once | INTRAMUSCULAR | Status: AC
  Administered 2023-02-01: 50 mg via INTRAMUSCULAR
  Filled 2023-02-01: qty 1

## 2023-02-01 MED ORDER — ZIPRASIDONE MESYLATE 20 MG IM SOLR
INTRAMUSCULAR | Status: AC
  Filled 2023-02-01: qty 20

## 2023-02-01 MED ORDER — MIDAZOLAM HCL 2 MG/2ML IJ SOLN
2.0000 mg | Freq: Once | INTRAMUSCULAR | Status: AC
  Administered 2023-02-01: 2 mg via INTRAMUSCULAR
  Filled 2023-02-01: qty 2

## 2023-02-01 MED ORDER — STERILE WATER FOR INJECTION IJ SOLN
INTRAMUSCULAR | Status: AC
  Filled 2023-02-01: qty 10

## 2023-02-01 MED ORDER — ZIPRASIDONE MESYLATE 20 MG IM SOLR
20.0000 mg | Freq: Once | INTRAMUSCULAR | Status: DC
  Filled 2023-02-01: qty 20

## 2023-02-01 MED ORDER — LORAZEPAM 2 MG/ML IJ SOLN
2.0000 mg | Freq: Once | INTRAMUSCULAR | Status: AC
  Administered 2023-02-01: 2 mg via INTRAMUSCULAR
  Filled 2023-02-01: qty 1

## 2023-02-01 MED ORDER — LORAZEPAM 1 MG PO TABS: 1.0000 mg | ORAL_TABLET | Freq: Once | ORAL | Status: DC

## 2023-02-01 NOTE — ED Provider Notes (Signed)
Emergency Medicine Observation Re-evaluation Note  [name] is a 52 y.o. male, seen on rounds today.  Pt initially presented to the ED for complaints of Aggressive Behavior and IVC Currently, the patient is awaiting transfer to [facility].  Physical Exam  BP 108/84 (BP Location: Left Arm)   Pulse (!) 59   Temp (!) 97.4 F (36.3 C)   Resp 20   SpO2 94%  Physical Exam General: Calm Cardiac: Well perfused Lungs: Even respirations Psych: Calm  ED Course / MDM  EKG:   I have reviewed the labs performed to date as well as medications administered while in observation.  Recent changes in the last 24 hours include patient accepted to [facility].  Plan  Current plan is for transfer to [facility] in the AM.    Tyeisha Dinan, Arlyss Repress, MD 02/01/23 1344

## 2023-02-01 NOTE — ED Notes (Signed)
Pt keep walking out of his room to the hallway.pt redirected to his room by sitter.pt eating 3 pm snack.

## 2023-02-01 NOTE — ED Notes (Signed)
Pt use the phone time two already

## 2023-02-01 NOTE — ED Notes (Signed)
Pt repeatedly walking out of room, this RN and sitter make multiple attempts to redirect pt and instruct pt to remain in his room

## 2023-02-01 NOTE — ED Notes (Signed)
[name] from admissions @ [facility] called. Her contact # [phone] (or for admissions if she is not there.) She informed me that the bed for this Pt will not be available until tomorrow after 0800 due to bed availability. Nurse can call report 02/02/2023 after 0800 for report.

## 2023-02-01 NOTE — Progress Notes (Signed)
CSW contacted [facility] Admission Department who stated that they would return the CSW's phone call regarding acceptance for this patient.   Damita Dunnings, MSW, LCSW-A  10:09 AM 02/01/2023

## 2023-02-01 NOTE — ED Notes (Signed)
Pt taking second  shower again

## 2023-02-01 NOTE — ED Notes (Signed)
Pt got his shower and linen changed

## 2023-02-01 NOTE — ED Notes (Signed)
Pt using the phone at this time his third last call for today

## 2023-02-01 NOTE — ED Notes (Signed)
Given given meal tray

## 2023-02-01 NOTE — ED Notes (Signed)
Pt continually coming to hallway, staff redirecting staff to room

## 2023-02-01 NOTE — ED Notes (Signed)
Pt yelling in his room saying he is [employer] and cursing to him self pouching the call out light

## 2023-02-01 NOTE — ED Notes (Signed)
Pt ask for more food. This tech gave pt more food and all his snacks + meals for day shift today

## 2023-02-01 NOTE — ED Notes (Signed)
Pt has been ACCEPTED to  [facility] for tomorrow (02/02/2023) after 0800 Accepting provider is Dr. Sofie Hartigan Reporting # [phone]

## 2023-02-01 NOTE — Progress Notes (Signed)
[facility] LCSW Progress Note   02/01/2023    10:36 AM  [place] A Bajorek   [id number]   Type of Contact and Topic:  Psychiatric Bed Placement   Pt accepted to [facility]     Patient meets inpatient criteria per Alona Bene, NP  The attending provider will be Dr. Sofie Hartigan  Call report to [phone]  Ilsa Iha, RN @ [facility] ED notified.     Pt scheduled  to arrive at [facility] after 0800 pending a stabilized BP.   Damita Dunnings, MSW, LCSW-A  10:37 AM 02/01/2023

## 2023-02-01 NOTE — ED Notes (Signed)
Pt playing with wall outlet, pt redirected to bed

## 2023-02-01 NOTE — ED Notes (Signed)
Pt asking what time it is and makes statements "My Mother has poisoned all the food in here.", "pt states "I am nine months pregnant", pt requests "can somebody supervise me shaving"

## 2023-02-01 NOTE — ED Notes (Signed)
Pt asleep resting breath unlabored

## 2023-02-01 NOTE — Progress Notes (Signed)
[facility] Psych ED Progress Note  02/01/2023 12:13 PM [place] A Kuriakose  MRN:  [id number]   Subjective:  Per Triage Note"  Patient BIB [facility] with aggressive behavior. Per report landlord called adult protective services d/t patient losing a lot of weight. Adult protective services got kicked on her chest when she's talking to the patient.    Patient was seen awake, alert and oriented to name and situation only.  He remains delusional and Paranoid.  Patient lacks insight in his mental illness.  He reports that the water ij his apartment is infected and that bottled water is not clean either.  He reports he stopped eating and drinking because using the water coming out from his faucet is dangerous to his health.  Patient denies he has no mother stating his mother is dead.  He also accuses that mother of been trying to kill him since Childhood.  Suddenly he asked to speak with his mother and his attorney.  Occasionally he refuses to take oral Medications because they contain Chemicals that kills people and he believes he has no Mental illness.  Patient continues to meet criteria for Inpatient Psychiatry hospitalization.  He has been accepted at [facility] Psychiatry unit and will be transferred tomorrow morning.  We will continue to monitor patient.  Principal Problem: Schizoaffective disorder, bipolar type (HCC) Diagnosis:  Principal Problem:   Schizoaffective disorder, bipolar type (HCC) Active Problems:   Schizophrenia, paranoid type [facility])   ED Assessment Time Calculation: Start Time: 1155 Stop Time: 1213 Total Time in Minutes (Assessment Completion): 18   Past Psychiatric History: see initial Psychiatry evaluation note  [place] Scale:  Flowsheet Row ED from 11/08/2022 in [facility] Emergency Department at [facility] ED from 09/09/2022 in [facility] Emergency Department at [facility] ED from 08/15/2022 in [facility] Emergency Department at [facility]  C-SSRS RISK CATEGORY No Risk No  Risk No Risk       Past Medical History:  Past Medical History:  Diagnosis Date   Asthma    CHF (congestive heart failure) (HCC)    Chronic back pain    COPD (chronic obstructive pulmonary disease) (HCC)    Knee pain, chronic    Migraines    Schizophrenia, schizo-affective (HCC)     Past Surgical History:  Procedure Laterality Date   KNEE SURGERY     four times   WISDOM TOOTH EXTRACTION     Family History:  Family History  Problem Relation Age of Onset   Hypertension Father    Family Psychiatric  History: see initial Psychiatry evaluation note Social History:  Social History   Substance and Sexual Activity  Alcohol Use Yes   Comment: occ     Social History   Substance and Sexual Activity  Drug Use No   Comment: Pt denied    Social History   Socioeconomic History   Marital status: Widowed    Spouse name: Not on file   Number of children: Not on file   Years of education: Not on file   Highest education level: Not on file  Occupational History   Occupation: on disability  Tobacco Use   Smoking status: Former    Packs/day: 1.00    Years: 33.00    Additional pack years: 0.00    Total pack years: 33.00    Types: Cigarettes   Smokeless tobacco: Never  Vaping Use   Vaping Use: Never used  Substance and Sexual Activity   Alcohol use: Yes  Comment: occ   Drug use: No    Comment: Pt denied   Sexual activity: Not Currently  Other Topics Concern   Not on file  Social History Narrative   Pt is a widower.  He lives alone, and he receives SSI Disability.  Pt was followed by Dr. Jannifer Franklin, but he has not seen Dr. Jannifer Franklin in several months.   Social Determinants of Health   Financial Resource Strain: Not on file  Food Insecurity: Not on file  Transportation Needs: Not on file  Physical Activity: Not on file  Stress: Not on file  Social Connections: Not on file    Sleep: Fair  Appetite:  Poor  Current Medications: Current Facility-Administered  Medications  Medication Dose Route Frequency Provider Last Rate Last Admin   albuterol (VENTOLIN HFA) 108 (90 Base) MCG/ACT inhaler 2 puff  2 puff Inhalation Q4H PRN Gwyneth Sprout, MD       ALPRAZolam Prudy Feeler) tablet 1 mg  1 mg Oral TID PRN Gwyneth Sprout, MD   1 mg at 01/30/23 1914   benztropine (COGENTIN) tablet 0.5 mg  0.5 mg Oral QHS Plunkett, Alphonzo Lemmings, MD   0.5 mg at 01/30/23 2122   LORazepam (ATIVAN) tablet 1 mg  1 mg Oral Once Adela Lank, Dan, DO       OLANZapine zydis (ZYPREXA) disintegrating tablet 5 mg  5 mg Oral QHS Gwyneth Sprout, MD   5 mg at 01/30/23 2122   paliperidone (INVEGA) 24 hr tablet 3 mg  3 mg Oral BID Gwyneth Sprout, MD   3 mg at 01/30/23 2122   ziprasidone (GEODON) injection 20 mg  20 mg Intramuscular Once Melene Plan, DO       Current Outpatient Medications  Medication Sig Dispense Refill   albuterol (VENTOLIN HFA) 108 (90 Base) MCG/ACT inhaler Inhale 2 puffs into the lungs every 4 (four) hours as needed for wheezing or shortness of breath. (Patient not taking: Reported on 01/30/2023) 18 g 1   ALPRAZolam (XANAX) 1 MG tablet Take 1 mg by mouth 3 (three) times daily as needed for anxiety. (Patient not taking: Reported on 01/30/2023)     benztropine (COGENTIN) 0.5 MG tablet Take 0.5 mg by mouth at bedtime. (Patient not taking: Reported on 01/30/2023)     chlorproMAZINE (THORAZINE) 100 MG tablet Take 1 tablet (100 mg total) by mouth at bedtime. (Patient not taking: Reported on 01/30/2023) 30 tablet 0   chlorproMAZINE (THORAZINE) 50 MG tablet Take 1 tablet (50 mg total) by mouth in the morning. (Patient not taking: Reported on 01/30/2023) 30 tablet 0   HYDROcodone-acetaminophen (NORCO) 7.5-325 MG tablet Take 1 tablet by mouth 3 (three) times daily as needed. (Patient not taking: Reported on 01/30/2023)     INVEGA SUSTENNA 156 MG/ML SUSY injection Inject into the muscle. (Patient not taking: Reported on 01/30/2023)     OLANZapine (ZYPREXA) 10 MG tablet Take 10 mg by mouth 2 (two)  times daily. (Patient not taking: Reported on 01/30/2023)     paliperidone (INVEGA SUSTENNA) 234 MG/1.5ML injection Inject 234 mg into the muscle once. (Patient not taking: Reported on 01/30/2023)     paliperidone (INVEGA) 3 MG 24 hr tablet Take 3 mg by mouth 2 (two) times daily. (Patient not taking: Reported on 01/30/2023)      Lab Results:  Results for orders placed or performed during the hospital encounter of 01/30/23 (from the past 48 hour(s))  Comprehensive metabolic panel     Status: Abnormal   Collection Time: 01/30/23  4:17 PM  Result Value Ref Range   Sodium 139 135 - 145 mmol/L   Potassium 3.3 (L) 3.5 - 5.1 mmol/L   Chloride 101 98 - 111 mmol/L   CO2 25 22 - 32 mmol/L   Glucose, Bld 146 (H) 70 - 99 mg/dL    Comment: Glucose reference range applies only to samples taken after fasting for at least 8 hours.   BUN 13 6 - 20 mg/dL   Creatinine, Ser 4.09 0.61 - 1.24 mg/dL   Calcium 81.1 (H) 8.9 - 10.3 mg/dL   Total Protein 8.0 6.5 - 8.1 g/dL   Albumin 4.7 3.5 - 5.0 g/dL   AST 21 15 - 41 U/L   ALT 17 0 - 44 U/L   Alkaline Phosphatase 49 38 - 126 U/L   Total Bilirubin 1.4 (H) 0.3 - 1.2 mg/dL   GFR, Estimated >91 >47 mL/min    Comment: (NOTE) Calculated using the CKD-EPI Creatinine Equation (2021)    Anion gap 13 5 - 15    Comment: Performed at [facility], [address], [place], [place] [address]  Ethanol     Status: None   Collection Time: 01/30/23  4:17 PM  Result Value Ref Range   Alcohol, Ethyl (B) <10 <10 mg/dL    Comment: (NOTE) Lowest detectable limit for serum alcohol is 10 mg/dL.  For medical purposes only. Performed at [facility], [address] [place], [place] [address]   Salicylate level     Status: Abnormal   Collection Time: 01/30/23  4:17 PM  Result Value Ref Range   Salicylate Lvl <7.0 (L) 7.0 - 30.0 mg/dL    Comment: Performed at [facility], [address], [place], [place] [address]   Acetaminophen level     Status: Abnormal   Collection Time: 01/30/23  4:17 PM  Result Value Ref Range   Acetaminophen (Tylenol), Serum <10 (L) 10 - 30 ug/mL    Comment: (NOTE) Therapeutic concentrations vary significantly. A range of 10-30 ug/mL  may be an effective concentration for many patients. However, some  are best treated at concentrations outside of this range. Acetaminophen concentrations >150 ug/mL at 4 hours after ingestion  and >50 ug/mL at 12 hours after ingestion are often associated with  toxic reactions.  Performed at [facility], [address] [place], [place] [address]   cbc     Status: None   Collection Time: 01/30/23  4:17 PM  Result Value Ref Range   WBC 7.2 4.0 - 10.5 K/uL   RBC 5.47 4.22 - 5.81 MIL/uL   Hemoglobin 15.9 13.0 - 17.0 g/dL   HCT 52.8 41.3 - 24.4 %   MCV 86.1 80.0 - 100.0 fL   MCH 29.1 26.0 - 34.0 pg   MCHC 33.8 30.0 - 36.0 g/dL   RDW 01.0 27.2 - 53.6 %   Platelets 223 150 - 400 K/uL   nRBC 0.0 0.0 - 0.2 %    Comment: Performed at [facility], [address] [place], [place] [address]  TSH     Status: None   Collection Time: 01/30/23  4:17 PM  Result Value Ref Range   TSH 0.875 0.350 - 4.500 uIU/mL    Comment: Performed by a 3rd Generation assay with a functional sensitivity of <=0.01 uIU/mL. Performed at [facility], [address], [place], [place] [address]   SARS Coronavirus 2 by RT PCR (hospital order, performed in [facility]  hospital lab) *cepheid single result test* Anterior Nasal Swab     Status: None   Collection Time: 02/01/23  1:05 AM   Specimen: Anterior Nasal Swab  Result Value Ref Range   SARS Coronavirus 2 by RT PCR NEGATIVE NEGATIVE    Comment: (NOTE) SARS-CoV-2 target nucleic acids are NOT DETECTED.  The SARS-CoV-2 RNA is generally detectable in upper and lower respiratory specimens during the acute phase of infection. The lowest concentration of  SARS-CoV-2 viral copies this assay can detect is 250 copies / mL. A negative result does not preclude SARS-CoV-2 infection and should not be used as the sole basis for treatment or other patient management decisions.  A negative result may occur with improper specimen collection / handling, submission of specimen other than nasopharyngeal swab, presence of viral mutation(s) within the areas targeted by this assay, and inadequate number of viral copies (<250 copies / mL). A negative result must be combined with clinical observations, patient history, and epidemiological information.  Fact Sheet for Patients:   RoadLapTop.co.za  Fact Sheet for Healthcare Providers: http://kim-miller.com/  This test is not yet approved or  cleared by the [place] FDA and has been authorized for detection and/or diagnosis of SARS-CoV-2 by FDA under an Emergency Use Authorization (EUA).  This EUA will remain in effect (meaning this test can be used) for the duration of the COVID-19 declaration under Section 564(b)(1) of the Act, 21 U.S.C. section 360bbb-3(b)(1), unless the authorization is terminated or revoked sooner.  Performed at [facility], [address], [place], [place] [address]   Rapid urine drug screen (hospital performed)     Status: Abnormal   Collection Time: 02/01/23  4:29 AM  Result Value Ref Range   Opiates NONE DETECTED NONE DETECTED   Cocaine NONE DETECTED NONE DETECTED   Benzodiazepines POSITIVE (A) NONE DETECTED   Amphetamines NONE DETECTED NONE DETECTED   Tetrahydrocannabinol NONE DETECTED NONE DETECTED   Barbiturates NONE DETECTED NONE DETECTED    Comment: (NOTE) DRUG SCREEN FOR MEDICAL PURPOSES ONLY.  IF CONFIRMATION IS NEEDED FOR ANY PURPOSE, NOTIFY LAB WITHIN 5 DAYS.  LOWEST DETECTABLE LIMITS FOR URINE DRUG SCREEN Drug Class                     Cutoff (ng/mL) Amphetamine and metabolites     1000 Barbiturate and metabolites    200 Benzodiazepine                 200 Opiates and metabolites        300 Cocaine and metabolites        300 THC                            50 Performed at [facility], [address] [place], [place] [address]     Blood Alcohol level:  Lab Results  Component Value Date   ETH <10 01/30/2023   ETH <10 11/09/2022    Physical Findings:  CIWA:    COWS:     Musculoskeletal: Strength & Muscle Tone: within normal limits Gait & Station: normal Patient leans: Front  Psychiatric Specialty Exam:  Presentation  General Appearance:  Neat; Fairly Groomed  Eye Contact: Fleeting  Speech: Clear and Coherent; Normal Rate  Speech Volume: Normal  Handedness: Right   Mood and Affect  Mood: Angry; Irritable; Labile; Depressed  Affect: Congruent; Labile; Depressed   Thought Process  Thought Processes:  Disorganized; Irrevelant  Descriptions of Associations:Circumstantial  Orientation:Partial  Thought Content:Illogical; Paranoid Ideation  History of Schizophrenia/Schizoaffective disorder:Yes  Duration of Psychotic Symptoms:Greater than six months  Hallucinations:Hallucinations: None  Ideas of Reference:Paranoia; Delusions  Suicidal Thoughts:Suicidal Thoughts: No  Homicidal Thoughts:Homicidal Thoughts: No   Sensorium  Memory: Immediate Fair; Recent Fair; Remote Fair  Judgment: Impaired  Insight: Lacking; Shallow   Executive Functions  Concentration: Fair  Attention Span: Fair  Recall: [employer] of Knowledge: Fair  Language: Good   Psychomotor Activity  Psychomotor Activity: Psychomotor Activity: Normal   Assets  Assets: Manufacturing systems engineer; Housing; Social Support   Sleep  Sleep: Sleep: Good    Physical Exam: Physical Exam Vitals and nursing note reviewed.  Constitutional:      Appearance: He is ill-appearing.  HENT:     Head: Normocephalic.     Nose: Nose  normal.  Cardiovascular:     Rate and Rhythm: Bradycardia present.  Pulmonary:     Effort: Pulmonary effort is normal.  Musculoskeletal:        General: Normal range of motion.  Skin:    General: Skin is dry.  Neurological:     Mental Status: He is alert and oriented to person, place, and time.  Psychiatric:        Attention and Perception: He is inattentive.        Mood and Affect: Mood is depressed. Affect is angry and inappropriate.        Speech: Speech is rapid and pressured.        Behavior: Behavior is uncooperative and agitated.        Thought Content: Thought content is paranoid and delusional.        Cognition and Memory: Cognition is impaired.        Judgment: Judgment is inappropriate.    Review of Systems  Constitutional:  Positive for weight loss.  HENT: Negative.    Eyes: Negative.   Respiratory: Negative.    Cardiovascular: Negative.   Gastrointestinal: Negative.   Genitourinary: Negative.   Musculoskeletal: Negative.   Neurological: Negative.   Endo/Heme/Allergies: Negative.   Psychiatric/Behavioral:  The patient is nervous/anxious.    Blood pressure 108/84, pulse (!) 59, temperature (!) 97.4 F (36.3 C), resp. rate 20, SpO2 94 %. There is no height or weight on file to calculate BMI.   Medical Decision Making: Patient remains paranoid, Delusional.  He lacks insight in his mental illness, was not eating at home , not drinking at home due to paranoia that water is infected.  Patient occasionally refuses Medications here.   He continues to require inpatient Psychiatry hospitalization.  Continue close monitoring 1:1, offer meals and Medications.  For Transfer to [facility] in am. Problem 1: Paranoia Disposition: Recommend psychiatric Inpatient admission.   Earney Navy, NP-PMHNP-BC 02/01/2023, 12:13 PM

## 2023-02-02 DIAGNOSIS — F250 Schizoaffective disorder, bipolar type: Secondary | ICD-10-CM | POA: Diagnosis not present

## 2023-02-02 NOTE — ED Provider Notes (Signed)
Emergency Medicine Observation Re-evaluation Note  [name] is a 52 y.o. male, seen on rounds today.  Pt initially presented to the ED for complaints of Aggressive Behavior and IVC Currently, the patient is stable and awaiting placement at [facility] Patient with ho schizophrenia and brought in by under ivc by pd after APS checked on him and found him aggressive.  He was found to be losing weight and brought to ED for evaluation.   Physical Exam  BP (!) 86/58 (BP Location: Left Arm)   Pulse (!) 52   Temp 97.9 F (36.6 C) (Oral)   Resp 19   SpO2 95%  Physical Exam General: wdwn-Patient eating breakfast Cardiac: rrr Lungs: cta Psych: resting  BP rechecked and 97/65  ED Course / MDM  EKG:   I have reviewed the labs performed to date as well as medications administered while in observation.  Recent changes in the last 24 hours include none.  Plan  Current plan is for awaiting psych placement.    Margarita Grizzle, MD 02/02/23 [phone]

## 2023-02-02 NOTE — ED Notes (Signed)
Called [place] and left number for call back on pager.

## 2023-02-02 NOTE — ED Notes (Signed)
Received call back from Jeannene Patella, Charity fundraiser at [facility].  Report given.  Requesting that patient's SBP be greater than 90.

## 2023-02-02 NOTE — ED Notes (Signed)
Patient off unit to facility per provider. Patient alert, cooperative  and no s/s  of distress. Patient discharge information and belongings given to sheriff for transport. Patient ambulatory off unit, escorted and transported by sheriff.

## 2023-03-08 ENCOUNTER — Emergency Department (HOSPITAL_COMMUNITY)
Admission: EM | Admit: 2023-03-08 | Discharge: 2023-03-10 | Disposition: A | Discharge status: Other Acute Inpatient Hospital | Payer: 59 | Attending: Emergency Medicine | Admitting: Emergency Medicine

## 2023-03-08 DIAGNOSIS — F17200 Nicotine dependence, unspecified, uncomplicated: Secondary | ICD-10-CM | POA: Diagnosis not present

## 2023-03-08 DIAGNOSIS — F200 Paranoid schizophrenia: Secondary | ICD-10-CM | POA: Diagnosis present

## 2023-03-08 DIAGNOSIS — F29 Unspecified psychosis not due to a substance or known physiological condition: Secondary | ICD-10-CM | POA: Diagnosis not present

## 2023-03-08 DIAGNOSIS — J449 Chronic obstructive pulmonary disease, unspecified: Secondary | ICD-10-CM | POA: Diagnosis not present

## 2023-03-08 DIAGNOSIS — J45909 Unspecified asthma, uncomplicated: Secondary | ICD-10-CM | POA: Insufficient documentation

## 2023-03-08 DIAGNOSIS — R440 Auditory hallucinations: Secondary | ICD-10-CM | POA: Insufficient documentation

## 2023-03-08 DIAGNOSIS — R451 Restlessness and agitation: Secondary | ICD-10-CM | POA: Diagnosis not present

## 2023-03-08 DIAGNOSIS — R4689 Other symptoms and signs involving appearance and behavior: Secondary | ICD-10-CM | POA: Diagnosis not present

## 2023-03-08 DIAGNOSIS — I509 Heart failure, unspecified: Secondary | ICD-10-CM | POA: Diagnosis not present

## 2023-03-08 LAB — CBC
HCT: 45.8 % (ref 39.0–52.0)
Hemoglobin: 15 g/dL (ref 13.0–17.0)
MCH: 29.6 pg (ref 26.0–34.0)
MCHC: 32.8 g/dL (ref 30.0–36.0)
MCV: 90.5 fL (ref 80.0–100.0)
Platelets: 238 10*3/uL (ref 150–400)
RBC: 5.06 MIL/uL (ref 4.22–5.81)
RDW: 13.2 % (ref 11.5–15.5)
WBC: 6.5 10*3/uL (ref 4.0–10.5)
nRBC: 0 % (ref 0.0–0.2)

## 2023-03-08 LAB — COMPREHENSIVE METABOLIC PANEL
ALT: 36 U/L (ref 0–44)
AST: 59 U/L — ABNORMAL HIGH (ref 15–41)
Albumin: 4.5 g/dL (ref 3.5–5.0)
Alkaline Phosphatase: 52 U/L (ref 38–126)
Anion gap: 13 (ref 5–15)
BUN: 18 mg/dL (ref 6–20)
CO2: 22 mmol/L (ref 22–32)
Calcium: 9.6 mg/dL (ref 8.9–10.3)
Chloride: 104 mmol/L (ref 98–111)
Creatinine, Ser: 0.91 mg/dL (ref 0.61–1.24)
GFR, Estimated: >60 mL/min (ref >60)
Glucose, Bld: 114 mg/dL — ABNORMAL HIGH (ref 70–99)
Potassium: 3.4 mmol/L — ABNORMAL LOW (ref 3.5–5.1)
Sodium: 139 mmol/L (ref 135–145)
Total Bilirubin: 1.8 mg/dL — ABNORMAL HIGH (ref 0.3–1.2)
Total Protein: 8 g/dL (ref 6.5–8.1)

## 2023-03-08 LAB — SALICYLATE LEVEL: Salicylate Lvl: <7 mg/dL — ABNORMAL LOW (ref 7.0–30.0)

## 2023-03-08 LAB — ACETAMINOPHEN LEVEL: Acetaminophen (Tylenol), Serum: <10 ug/mL — ABNORMAL LOW (ref 10–30)

## 2023-03-08 LAB — ETHANOL: Alcohol, Ethyl (B): <10 mg/dL (ref <10)

## 2023-03-08 MED ORDER — ZIPRASIDONE MESYLATE 20 MG IM SOLR
INTRAMUSCULAR | Status: AC
  Filled 2023-03-08: qty 20

## 2023-03-08 MED ORDER — MIDAZOLAM HCL 2 MG/2ML IJ SOLN
2.0000 mg | Freq: Once | INTRAMUSCULAR | Status: AC
  Filled 2023-03-08: qty 2

## 2023-03-08 MED ORDER — LORAZEPAM 1 MG PO TABS: 1.0000 mg | ORAL_TABLET | Freq: Four times a day (QID) | ORAL | Status: DC | PRN

## 2023-03-08 MED ORDER — STERILE WATER FOR INJECTION IJ SOLN
INTRAMUSCULAR | Status: AC
  Filled 2023-03-08: qty 10

## 2023-03-08 MED ORDER — MIDAZOLAM HCL 2 MG/2ML IJ SOLN
INTRAMUSCULAR | Status: AC
  Administered 2023-03-08: 2 mg via INTRAMUSCULAR
  Filled 2023-03-08: qty 2

## 2023-03-08 NOTE — ED Notes (Signed)
Pt dressed out in burgundy scrubs, security to wand pt.

## 2023-03-08 NOTE — ED Provider Notes (Signed)
[place] EMERGENCY DEPARTMENT AT [facility] Provider Note   CSN: [id number] Arrival date & time: 03/08/23  2207     History  Chief Complaint  Patient presents with   IVC    [name] is a 52 y.o. male.  Pt is a 52 yo male with pmhx significant for schizophrenia, migraines, asthma, copd and chf.  Pt has not been taking his meds.  He was IVC'd today by family because he's not been eating or drinking.  He is afraid he is getting poisoned.  He has not been bathing because there are feces in the water.  He is very agitated upon arrival with police.  He is verbally aggressive.  He required 2 mg versed IM to relax him before we could evaluate him.  Geodon and Haldol are allergies on epic.       Home Medications Prior to Admission medications   Medication Sig Start Date End Date Taking? Authorizing Provider  albuterol (VENTOLIN HFA) 108 (90 Base) MCG/ACT inhaler Inhale 2 puffs into the lungs every 4 (four) hours as needed for wheezing or shortness of breath. Patient not taking: Reported on 01/30/2023 09/02/19   Derwood Kaplan, MD  ALPRAZolam Prudy Feeler) 1 MG tablet Take 1 mg by mouth 3 (three) times daily as needed for anxiety. Patient not taking: Reported on 01/30/2023    [provider]  benztropine (COGENTIN) 0.5 MG tablet Take 0.5 mg by mouth at bedtime. Patient not taking: Reported on 01/30/2023 07/05/22   [provider]  chlorproMAZINE (THORAZINE) 100 MG tablet Take 1 tablet (100 mg total) by mouth at bedtime. Patient not taking: Reported on 01/30/2023 09/23/21   Bobbye Morton, MD  chlorproMAZINE (THORAZINE) 50 MG tablet Take 1 tablet (50 mg total) by mouth in the morning. Patient not taking: Reported on 01/30/2023 09/24/21   Bobbye Morton, MD  HYDROcodone-acetaminophen (NORCO) 7.5-325 MG tablet Take 1 tablet by mouth 3 (three) times daily as needed. Patient not taking: Reported on 01/30/2023 05/27/22   [provider]  INVEGA SUSTENNA 156 MG/ML  SUSY injection Inject into the muscle. Patient not taking: Reported on 01/30/2023 07/11/22   [provider]  OLANZapine (ZYPREXA) 10 MG tablet Take 10 mg by mouth 2 (two) times daily. Patient not taking: Reported on 01/30/2023    [provider]  paliperidone (INVEGA SUSTENNA) 234 MG/1.5ML injection Inject 234 mg into the muscle once. Patient not taking: Reported on 01/30/2023    [provider]  paliperidone (INVEGA) 3 MG 24 hr tablet Take 3 mg by mouth 2 (two) times daily. Patient not taking: Reported on 01/30/2023 07/05/22   [provider]      Allergies    Amoxicillin, Dextromethorphan-guaifenesin, Haloperidol, Meloxicam, Nsaids, Quetiapine, Sudafed [pseudoephedrine hcl], Ziprasidone, and Prednisone    Review of Systems   Review of Systems  Psychiatric/Behavioral:  Positive for agitation and hallucinations.   All other systems reviewed and are negative.   Physical Exam Updated Vital Signs BP 117/86   Pulse 63   Temp (!) 97.4 F (36.3 C)   Resp 18   Ht 6' (1.829 m)   Wt 70.3 kg   SpO2 98%   BMI 21.02 kg/m  Physical Exam Vitals and nursing note reviewed.  Constitutional:      Comments: disheveled  HENT:     Head: Normocephalic and atraumatic.     Right Ear: External ear normal.     Left Ear: External ear normal.     Nose:  Nose normal.     Mouth/Throat:     Mouth: Mucous membranes are dry.  Eyes:     Extraocular Movements: Extraocular movements intact.     Conjunctiva/sclera: Conjunctivae normal.     Pupils: Pupils are equal, round, and reactive to light.  Cardiovascular:     Rate and Rhythm: Normal rate and regular rhythm.     Pulses: Normal pulses.     Heart sounds: Normal heart sounds.  Pulmonary:     Effort: Pulmonary effort is normal.     Breath sounds: Normal breath sounds.  Abdominal:     General: Abdomen is flat. Bowel sounds are normal.     Palpations: Abdomen is soft.  Musculoskeletal:        General: Normal range of  motion.     Cervical back: Normal range of motion and neck supple.  Skin:    General: Skin is warm.     Capillary Refill: Capillary refill takes less than 2 seconds.  Neurological:     General: No focal deficit present.     Mental Status: He is alert and oriented to person, place, and time.  Psychiatric:        Attention and Perception: He perceives auditory hallucinations.        Mood and Affect: Affect is angry.        Speech: Speech is tangential.        Behavior: Behavior is uncooperative, agitated and aggressive.        Thought Content: Thought content is paranoid.     ED Results / Procedures / Treatments   Labs (all labs ordered are listed, but only abnormal results are displayed) Labs Reviewed  COMPREHENSIVE METABOLIC PANEL - Abnormal; Notable for the following components:      Result Value   Potassium 3.4 (*)    Glucose, Bld 114 (*)    AST 59 (*)    Total Bilirubin 1.8 (*)    All other components within normal limits  SALICYLATE LEVEL - Abnormal; Notable for the following components:   Salicylate Lvl <7.0 (*)    All other components within normal limits  ACETAMINOPHEN LEVEL - Abnormal; Notable for the following components:   Acetaminophen (Tylenol), Serum <10 (*)    All other components within normal limits  ETHANOL  CBC  RAPID URINE DRUG SCREEN, HOSP PERFORMED    EKG None  Radiology No results found.  Procedures Procedures    Medications Ordered in ED Medications  sterile water (preservative free) injection (  Not Given 03/08/23 2315)  midazolam (VERSED) injection 2 mg (2 mg Intramuscular Given 03/08/23 2217)    ED Course/ Medical Decision Making/ A&P                             Medical Decision Making Amount and/or Complexity of Data Reviewed Labs: ordered.  Risk Prescription drug management.   This patient presents to the ED for concern of psychosis, this involves an extensive number of treatment options, and is a complaint that carries with  it a high risk of complications and morbidity.  The differential diagnosis includes med noncompliance, electrolyte abn, infection   Co morbidities that complicate the patient evaluation  schizophrenia, migraines, asthma, copd and chf.   Additional history obtained:  Additional history obtained from epic chart review External records from outside source obtained and reviewed including police   Lab Tests:  I Ordered, and personally interpreted labs.  The pertinent  results include:  cbc nl, cmp nl, etoh nl, sal neg, tyl neg   Cardiac Monitoring:  The patient was maintained on a cardiac monitor.  I personally viewed and interpreted the cardiac monitored which showed an underlying rhythm of: nsr   Medicines ordered and prescription drug management:  I ordered medication including versed  for sedation  Reevaluation of the patient after these medicines showed that the patient improved I have reviewed the patients home medicines and have made adjustments as needed   Problem List / ED Course:  Psychosis:  pt has been calm since the versed.  He is medically clear for TTS consult.   Reevaluation:  After the interventions noted above, I reevaluated the patient and found that they have :improved   Social Determinants of Health:  Lives at home   Dispostion:  After consideration of the diagnostic results and the patients response to treatment, I feel that the patent would benefit from TTS consult.          Final Clinical Impression(s) / ED Diagnoses Final diagnoses:  Psychosis, unspecified psychosis type [facility])    Rx / DC Orders ED Discharge Orders     None         Jacalyn Lefevre, MD 03/08/23 2336

## 2023-03-08 NOTE — BH Assessment (Signed)
TTS clinician attempted to complete assessment. Per Morrie Sheldon, RN, patient punched screen and will need to be seen at a later time.

## 2023-03-08 NOTE — ED Triage Notes (Signed)
Pt to ED via [employer], IVC, per IVC pt has a hx of schizophrenia with an extensive history of being in mental health facilities. Petitioner concerned that pt is unable to care for himself. On arrival pt is paranoid, delusional, verbally aggressive, appears to be responding to internal stimuli.

## 2023-03-09 ENCOUNTER — Encounter (HOSPITAL_COMMUNITY): Payer: Self-pay

## 2023-03-09 ENCOUNTER — Other Ambulatory Visit: Payer: Self-pay

## 2023-03-09 DIAGNOSIS — F200 Paranoid schizophrenia: Secondary | ICD-10-CM | POA: Diagnosis not present

## 2023-03-09 MED ORDER — OLANZAPINE 10 MG IM SOLR
10.0000 mg | Freq: Once | INTRAMUSCULAR | Status: AC
  Administered 2023-03-09: 10 mg via INTRAMUSCULAR
  Filled 2023-03-09: qty 10

## 2023-03-09 MED ORDER — PALIPERIDONE ER 3 MG PO TB24
3.0000 mg | ORAL_TABLET | Freq: Every day | ORAL | Status: DC
  Filled 2023-03-09 (×2): qty 1

## 2023-03-09 MED ORDER — CHLORPROMAZINE HCL 100 MG PO TABS
100.0000 mg | ORAL_TABLET | Freq: Every day | ORAL | Status: DC
  Filled 2023-03-09: qty 1

## 2023-03-09 MED ORDER — DIPHENHYDRAMINE HCL 50 MG/ML IJ SOLN
25.0000 mg | Freq: Once | INTRAMUSCULAR | Status: AC
  Administered 2023-03-09: 25 mg via INTRAMUSCULAR
  Filled 2023-03-09: qty 1

## 2023-03-09 MED ORDER — HYDROXYZINE HCL 25 MG PO TABS: 25.0000 mg | ORAL_TABLET | Freq: Three times a day (TID) | ORAL | Status: DC | PRN

## 2023-03-09 MED ORDER — STERILE WATER FOR INJECTION IJ SOLN
INTRAMUSCULAR | Status: AC
  Administered 2023-03-09: 10 mL
  Filled 2023-03-09: qty 10

## 2023-03-09 MED ORDER — CHLORPROMAZINE HCL 25 MG PO TABS
50.0000 mg | ORAL_TABLET | Freq: Every day | ORAL | Status: DC
  Filled 2023-03-09: qty 2

## 2023-03-09 MED ORDER — PALIPERIDONE ER 6 MG PO TB24
6.0000 mg | ORAL_TABLET | Freq: Every day | ORAL | Status: DC
  Filled 2023-03-09: qty 1

## 2023-03-09 NOTE — ED Notes (Signed)
Patient alert this shift.  Patient refused EKG, VS and medication. Patient is disorganized, Patient rambling and nonsensical talk at times.  Patient talking and having delusional thoughts.

## 2023-03-09 NOTE — ED Notes (Signed)
Pt. Refused vitals.

## 2023-03-09 NOTE — Progress Notes (Signed)
Pt was accepted to [facility] 03/10/2023. Bed assignment: Main campus  Pt meets inpatient criteria per Dahlia Byes, NP  Attending Physician will be Loni Beckwith, MD  Report can be called to: [phone] (this is a pager, please leave call-back number when giving report)  Pt can arrive after 8 AM  Care Team Notified: Dahlia Byes, NP and Lum Babe, RN  [place], [place]  03/09/2023 12:50 PM
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
## 2023-03-09 NOTE — ED Provider Notes (Signed)
Emergency Medicine Observation Re-evaluation Note  [name] is a 52 y.o. male, seen on rounds today.  Pt initially presented to the ED for complaints of IVC Currently, the patient is resting.  Physical Exam  BP 117/86   Pulse 63   Temp (!) 97.4 F (36.3 C)   Resp 18   Ht 6' (1.829 m)   Wt 70.3 kg   SpO2 98%   BMI 21.02 kg/m  Physical Exam General: Asleep, no acute distress Cardiac: regular rate Lungs: no increased work of breathing Psych: calm, asleep  ED Course / MDM  EKG:   I have reviewed the labs performed to date as well as medications administered while in observation.  Recent changes in the last 24 hours include agitation overnight. He was medically cleared and is pending TTS eval for dispo recommendations..  Plan  Current plan is for TTS eval for dispo recommendations.    Rexford Maus, DO 03/09/23 [phone]

## 2023-03-09 NOTE — ED Notes (Signed)
Pt observed with even and unlabored respirations, while laying on right side at this time. NAD observed.
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
## 2023-03-09 NOTE — ED Notes (Signed)
Heard incoming call on TTS machine. Answered and asked who they were attempting to evaluate and they identified this pt. Walked cart back to [place] [employer] and pt was in hallway. Advised Mr.Duskin that I had someone who wanted to speak with him. Pt began violently punching screen on TTS cart multiple times. Removed cart from pt area. RN witnessed and aware. Security called to unit. Charge RN notified that the cart may be broken.

## 2023-03-09 NOTE — ED Notes (Signed)
Pt has presented to nurse's station more than a dozen times from time he was moved into ER RM 42 from ER RM 4, requesting something to drink, something to eat. Attempts made to verbally redirect patient back to his room unsuccessful. Pt informed of need for urine specimen and pt states "I don't give dick, I don't fuck, I am not into pissing so if that's what you want you can go fuck yourself." Pt informed that talking in a manner to staff is severely inappropriate, and redirected that urine specimen is necessary for medical care to be completed. On attempt made by Morrie Sheldon, EMT to place TTS cart in patient room for assessment with social worker, pt began punching the monitor and equipment in the hallway. Pt instructed to return to room as that behavior is inappropriate and medication requested with ED Provider as IM Versed not helping pt anger and aggression. Pt also making attempts to interact with other patients in his hallway area, and all pts provided verbal redirection back to their rooms to de-escalate behavior and prevent fighting amidst patients at this time. Pt continues to yell and curse this nurse and inquires "are you trying to cremate me?!" "How fucking dare you use alcohol on me! We can't breathe that in! You're trying to fucking kill me." Security requested to assist with IM medication administration successful. Pt remains in bed in his room at this time.

## 2023-03-09 NOTE — Progress Notes (Signed)
LCSW Progress Note  [id number]   [name]  03/09/2023  12:23 PM  Description:   Inpatient Psychiatric Referral  Patient was recommended inpatient per Dahlia Byes, NP. There are no available beds at [facility], per [facility], RN. Patient was referred to the following out of network facilities:   [facility] Provider Address Phone Fax  [facility]  [address] [place] [address] [phone]  [facility]  [address] [place] [address] [phone]  [facility]  [address] [place] [address] [phone]  [facility]  [address] [place], [place] [address] [phone]  [facility] [address] [place]  [address] [place] [address] [phone]  [facility]  [address] [place], [place] [address] [phone]  [facility]  [address] [place] [address] [phone]  [facility]  [address] [place] [address] [phone]  [facility]  [address] [place] [address] [phone]  [facility]  [address] [place], [place] [address] [phone]  [facility]  [address] [place] [address] [phone]  [facility]  [address] [place] [address] [phone]  [facility]  [address], [place] [address] [phone]  [facility]  [address] [place] [address] [phone]  [facility]  [address], [place] [address] [phone]  [facility]  [address], [place]  [place] [address] [phone]  [facility]  [address], [place] [address] [phone]  [facility]  [address] [place] [address] [phone]  [facility]  [address], [place] [address] [phone]  [facility]  [address], [place] [address] [phone]  [facility]  [address] [place] [address] [phone]  [facility]  [address], [place] [address] [phone]  [facility]  [address] [place] [address] [phone]  [facility]  [address] [place] [address] [phone]  [facility]  [address] [place] [address] [phone]    Situation ongoing, CSW to continue following and update chart as more information becomes available.      Cathie Beams, [place]  03/09/2023 12:23 PM

## 2023-03-09 NOTE — BH Assessment (Signed)
TTS attempted to complete assessment. Per Fulton Mole, RN, patient medicated and asleep. Assessment will be completed at later time.

## 2023-03-10 NOTE — ED Notes (Signed)
Called [facility] and left page for return call.  VM left for sheriff transport.

## 2023-03-10 NOTE — ED Notes (Signed)
Patient adamantly refusing to let staff check vital signs

## 2023-03-10 NOTE — ED Provider Notes (Signed)
Emergency Medicine Observation Re-evaluation Note  [name] is a 52 y.o. male, seen on rounds today.  Pt initially presented to the ED for complaints of IVC Currently, the patient is lying in bed, awake. Hostile. Accepted to [facility]  Physical Exam  BP 118/69   Pulse 62   Temp 97.6 F (36.4 C)   Resp 16   Ht 6' (1.829 m)   Wt 70.3 kg   SpO2 98%   BMI 21.02 kg/m  Physical Exam General:Lying in bed on side, awake. No respiratory distress. Speech clear. Refused to sit up in bed and allow PE. Tells me he can, but he WON'T. Transitioned into hostile diatribe on social/cultural issues.  ED Course / MDM  EKG:   I have reviewed the labs performed to date as well as medications administered while in observation.  Recent changes in the last 24 hours include accepted to [facility].  Plan  Current plan is for Transport with Police: EMTALA completed.    Arby Barrette, MD 03/10/23 1123

## 2023-05-01 ENCOUNTER — Emergency Department (HOSPITAL_COMMUNITY): Payer: 59

## 2023-05-01 ENCOUNTER — Emergency Department (HOSPITAL_COMMUNITY)
Admission: EM | Admit: 2023-05-01 | Discharge: 2023-05-03 | Disposition: A | Discharge status: Home / Self Care | Payer: 59 | Attending: Emergency Medicine | Admitting: Emergency Medicine

## 2023-05-01 ENCOUNTER — Encounter (HOSPITAL_COMMUNITY): Payer: Self-pay | Admitting: Radiology

## 2023-05-01 ENCOUNTER — Other Ambulatory Visit: Payer: Self-pay

## 2023-05-01 DIAGNOSIS — Z23 Encounter for immunization: Secondary | ICD-10-CM | POA: Insufficient documentation

## 2023-05-01 DIAGNOSIS — S0990XA Unspecified injury of head, initial encounter: Secondary | ICD-10-CM

## 2023-05-01 DIAGNOSIS — R456 Violent behavior: Secondary | ICD-10-CM | POA: Diagnosis not present

## 2023-05-01 DIAGNOSIS — F209 Schizophrenia, unspecified: Secondary | ICD-10-CM | POA: Diagnosis not present

## 2023-05-01 DIAGNOSIS — R4689 Other symptoms and signs involving appearance and behavior: Secondary | ICD-10-CM

## 2023-05-01 DIAGNOSIS — F28 Other psychotic disorder not due to a substance or known physiological condition: Secondary | ICD-10-CM | POA: Insufficient documentation

## 2023-05-01 MED ORDER — OLANZAPINE 5 MG PO TABS
5.0000 mg | ORAL_TABLET | Freq: Every day | ORAL | Status: DC
  Administered 2023-05-01 – 2023-05-02 (×2): 5 mg via ORAL
  Filled 2023-05-01 (×2): qty 1

## 2023-05-01 MED ORDER — LORAZEPAM 2 MG/ML IJ SOLN
2.0000 mg | Freq: Once | INTRAMUSCULAR | Status: AC
  Administered 2023-05-01: 2 mg via INTRAMUSCULAR
  Filled 2023-05-01: qty 1

## 2023-05-01 MED ORDER — TETANUS-DIPHTH-ACELL PERTUSSIS 5-2.5-18.5 LF-MCG/0.5 IM SUSY
0.5000 mL | PREFILLED_SYRINGE | Freq: Once | INTRAMUSCULAR | Status: AC
  Administered 2023-05-01: 0.5 mL via INTRAMUSCULAR
  Filled 2023-05-01: qty 0.5

## 2023-05-01 NOTE — ED Notes (Signed)
Gave pt sandwich & coffee

## 2023-05-01 NOTE — ED Notes (Signed)
Pt has been provided with 4 sandwiches and 4 drinks over the past hour.

## 2023-05-01 NOTE — ED Notes (Signed)
Pt allowed this nurse to clean his head to visualize his lacerations.

## 2023-05-01 NOTE — ED Notes (Signed)
Attempted to collect patient lab work. Pt refused and grabbed the tourniquet. This nurse retrieved the tourniquet and made provider aware.
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
## 2023-05-02 LAB — CBC WITH DIFFERENTIAL/PLATELET
Abs Immature Granulocytes: 0.04 10*3/uL (ref 0.00–0.07)
Basophils Absolute: 0 10*3/uL (ref 0.0–0.1)
Basophils Relative: 1 %
Eosinophils Absolute: 0 10*3/uL (ref 0.0–0.5)
Eosinophils Relative: 0 %
HCT: 47.4 % (ref 39.0–52.0)
Hemoglobin: 15.1 g/dL (ref 13.0–17.0)
Immature Granulocytes: 1 %
Lymphocytes Relative: 20 %
Lymphs Abs: 1.5 10*3/uL (ref 0.7–4.0)
MCH: 29 pg (ref 26.0–34.0)
MCHC: 31.9 g/dL (ref 30.0–36.0)
MCV: 91 fL (ref 80.0–100.0)
Monocytes Absolute: 0.4 10*3/uL (ref 0.1–1.0)
Monocytes Relative: 5 %
Neutro Abs: 5.6 10*3/uL (ref 1.7–7.7)
Neutrophils Relative %: 73 %
Platelets: 234 10*3/uL (ref 150–400)
RBC: 5.21 MIL/uL (ref 4.22–5.81)
RDW: 12.4 % (ref 11.5–15.5)
WBC: 7.6 10*3/uL (ref 4.0–10.5)
nRBC: 0 % (ref 0.0–0.2)

## 2023-05-02 LAB — BASIC METABOLIC PANEL
Anion gap: 9 (ref 5–15)
BUN: 15 mg/dL (ref 6–20)
CO2: 32 mmol/L (ref 22–32)
Calcium: 9.5 mg/dL (ref 8.9–10.3)
Chloride: 95 mmol/L — ABNORMAL LOW (ref 98–111)
Creatinine, Ser: 0.95 mg/dL (ref 0.61–1.24)
GFR, Estimated: >60 mL/min (ref >60)
Glucose, Bld: 97 mg/dL (ref 70–99)
Potassium: 2.8 mmol/L — ABNORMAL LOW (ref 3.5–5.1)
Sodium: 136 mmol/L (ref 135–145)

## 2023-05-02 LAB — RAPID URINE DRUG SCREEN, HOSP PERFORMED
Amphetamines: NOT DETECTED
Barbiturates: NOT DETECTED
Benzodiazepines: NOT DETECTED
Cocaine: NOT DETECTED
Opiates: NOT DETECTED
Tetrahydrocannabinol: NOT DETECTED

## 2023-05-02 LAB — HEPATIC FUNCTION PANEL
ALT: 13 U/L (ref 0–44)
AST: 17 U/L (ref 15–41)
Albumin: 3 g/dL — ABNORMAL LOW (ref 3.5–5.0)
Alkaline Phosphatase: 44 U/L (ref 38–126)
Bilirubin, Direct: 0.1 mg/dL (ref 0.0–0.2)
Indirect Bilirubin: 0.4 mg/dL (ref 0.3–0.9)
Total Bilirubin: 0.5 mg/dL (ref 0.3–1.2)
Total Protein: 5.5 g/dL — ABNORMAL LOW (ref 6.5–8.1)

## 2023-05-02 LAB — SALICYLATE LEVEL: Salicylate Lvl: <7 mg/dL — ABNORMAL LOW (ref 7.0–30.0)

## 2023-05-02 LAB — ACETAMINOPHEN LEVEL: Acetaminophen (Tylenol), Serum: <10 ug/mL — ABNORMAL LOW (ref 10–30)

## 2023-05-02 LAB — POTASSIUM: Potassium: 2.9 mmol/L — ABNORMAL LOW (ref 3.5–5.1)

## 2023-05-02 LAB — MAGNESIUM: Magnesium: 2.3 mg/dL (ref 1.7–2.4)

## 2023-05-02 LAB — ETHANOL: Alcohol, Ethyl (B): <10 mg/dL (ref <10)

## 2023-05-02 MED ORDER — MAGNESIUM OXIDE -MG SUPPLEMENT 400 (240 MG) MG PO TABS: 800.0000 mg | ORAL_TABLET | Freq: Once | ORAL | Status: DC

## 2023-05-02 MED ORDER — POTASSIUM CHLORIDE CRYS ER 20 MEQ PO TBCR
40.0000 meq | EXTENDED_RELEASE_TABLET | Freq: Once | ORAL | Status: AC
  Administered 2023-05-02: 40 meq via ORAL

## 2023-05-02 MED ORDER — POTASSIUM CHLORIDE CRYS ER 20 MEQ PO TBCR: 40.0000 meq | EXTENDED_RELEASE_TABLET | ORAL | Status: AC

## 2023-05-02 NOTE — Progress Notes (Signed)
05/02/2023  1016  Urine sent to lab, Light green tube sent to lab.

## 2023-05-02 NOTE — Progress Notes (Signed)
05/02/2023 1123  Called lab to see if Mag level could be added to light green tube. Per lab they will add on Magnesium level on to current light green tube.
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
## 2023-05-02 NOTE — ED Notes (Addendum)
Patient sitting at the side of his bed eating his food.

## 2023-05-02 NOTE — ED Notes (Signed)
Patient out of room roaming in hallway, yelling at staff.

## 2023-05-02 NOTE — Progress Notes (Addendum)
Pt meets inpatient behavioral health placement. [facility] Antoinette Cillo, RN pt is not medically clear/stable for admission to [facility] at this time.  Pt sent to out of network providers:  Destination  Address Phone Fax  [address] [place], [place] [address] [phone]  [address] [place] [address] [phone]  [address] [place] [address] [phone]  [address] [place] [address] [phone]  [address] [place] [address] [phone]  [address] [place] [address] [phone]  [address] [place] [address] [phone]  [address] [place] [address] [phone] [address], [place] [address] [phone]  [address] [place] [address] [phone]  [address], [place] [address] [phone]  [address] [place] [address] [phone]  [facility] [phone]  [address] [place], [place] [address] [phone]  [place] [address] [phone]  [facility], [place] [phone]  [address] [place] [address] [phone]  [address], [place] [address] [phone]  [address], [place] [address] [phone]  [address] [place] [address] [phone]  [address] [place] [address] [phone]  [place] [phone]  [address], [place] [address] [phone]  [address] [place] [address] [phone]  [address] [place] [address] [phone]    Maryjean Ka, MSW, LCSWA 05/02/2023 10:01 PM

## 2023-05-02 NOTE — Progress Notes (Addendum)
Pt was accepted to [facility] TOMORROW 05/03/2023   Pt meets inpatient criteria per Alona Bene, PMHNP  Attending Physician will be Dr. Phyllis Ginger. [place], MD   Report can be called to: [phone]   Pt can arrive after: 11:00am   Care Team notified: [facility], PMHNP,Taquita Racheal Patches, MSW, [facility] 05/02/2023 10:51 PM

## 2023-05-02 NOTE — ED Notes (Signed)
Patient resting in bed quietly, calm, cooperative

## 2023-05-02 NOTE — ED Provider Notes (Signed)
Lab work reviewed.  Hypokalemia of 2.8 is noted.  This is replaced.  Patient has been seen by TTS.  Inpatient treatment recommended.  Holding orders are placed.   Glynn Octave, MD 05/02/23 [phone]

## 2023-05-03 LAB — POTASSIUM: Potassium: 3.5 mmol/L (ref 3.5–5.1)

## 2023-05-03 NOTE — ED Provider Notes (Signed)
Emergency Medicine Observation Re-evaluation Note  [name] is a 52 y.o. male, seen on rounds today.  Pt initially presented to the ED for complaints of Head Injury Currently, the patient is asleep in bed.  Physical Exam  BP 110/81 (BP Location: Left Arm)   Pulse (!) 59   Temp 97.7 F (36.5 C) (Oral)   Resp 16   Ht 6' (1.829 m)   Wt 70.3 kg   SpO2 100%   BMI 21.02 kg/m  Physical Exam General: Asleep, no acute distress Cardiac: Regular rate Lungs: No increased work of breathing Psych: Calm, sleep  ED Course / MDM  EKG:   I have reviewed the labs performed to date as well as medications administered while in observation.  Recent changes in the last 24 hours include patient had labs performed yesterday that shows persistent hypokalemia.  He was refusing to take all of his potassium overnight but did take 2 tabs between last night and this morning.  Will have repeat potassium level obtained.  He is recommended for inpatient psych and is excepted to [facility] today.  Plan  Current plan is for repeat potassium this morning with likely plan for admission at [facility].    Rexford Maus, DO 05/03/23 [phone]

## 2023-05-03 NOTE — ED Notes (Signed)
Patient constantly comes out of room pacing around unit although told multiple times to go back to room.

## 2023-05-03 NOTE — Progress Notes (Signed)
05/03/2023  0919  [facility] [phone] to give report. Report given to [place].

## 2023-07-08 ENCOUNTER — Ambulatory Visit (HOSPITAL_COMMUNITY)
Admission: EM | Admit: 2023-07-08 | Discharge: 2023-07-12 | Disposition: A | Discharge status: Other Acute Inpatient Hospital | Payer: 59 | Attending: Psychiatry | Admitting: Psychiatry

## 2023-07-08 DIAGNOSIS — R45851 Suicidal ideations: Secondary | ICD-10-CM | POA: Diagnosis not present

## 2023-07-08 DIAGNOSIS — Z1152 Encounter for screening for COVID-19: Secondary | ICD-10-CM | POA: Insufficient documentation

## 2023-07-08 DIAGNOSIS — F201 Disorganized schizophrenia: Secondary | ICD-10-CM

## 2023-07-08 DIAGNOSIS — F200 Paranoid schizophrenia: Secondary | ICD-10-CM | POA: Insufficient documentation

## 2023-07-08 DIAGNOSIS — R443 Hallucinations, unspecified: Secondary | ICD-10-CM | POA: Diagnosis not present

## 2023-07-08 DIAGNOSIS — F209 Schizophrenia, unspecified: Secondary | ICD-10-CM

## 2023-07-08 DIAGNOSIS — F411 Generalized anxiety disorder: Secondary | ICD-10-CM | POA: Diagnosis not present

## 2023-07-08 DIAGNOSIS — F69 Unspecified disorder of adult personality and behavior: Secondary | ICD-10-CM

## 2023-07-08 DIAGNOSIS — R4189 Other symptoms and signs involving cognitive functions and awareness: Secondary | ICD-10-CM

## 2023-07-08 DIAGNOSIS — R799 Abnormal finding of blood chemistry, unspecified: Secondary | ICD-10-CM

## 2023-07-08 MED ORDER — LORAZEPAM 1 MG PO TABS
1.0000 mg | ORAL_TABLET | ORAL | Status: AC | PRN
  Administered 2023-07-10: 1 mg via ORAL
  Filled 2023-07-08: qty 1

## 2023-07-08 MED ORDER — OLANZAPINE 10 MG PO TBDP
10.0000 mg | ORAL_TABLET | Freq: Three times a day (TID) | ORAL | Status: DC | PRN
  Administered 2023-07-10: 10 mg via ORAL
  Filled 2023-07-08: qty 1

## 2023-07-08 MED ORDER — MAGNESIUM HYDROXIDE 400 MG/5ML PO SUSP: 30.0000 mL | Freq: Every day | ORAL | Status: DC | PRN

## 2023-07-08 MED ORDER — ACETAMINOPHEN 325 MG PO TABS: 650.0000 mg | ORAL_TABLET | Freq: Four times a day (QID) | ORAL | Status: DC | PRN

## 2023-07-08 MED ORDER — ALUM & MAG HYDROXIDE-SIMETH 200-200-20 MG/5ML PO SUSP: 30.0000 mL | ORAL | Status: DC | PRN

## 2023-07-08 NOTE — ED Provider Notes (Signed)
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  Per the IVC respondent has been diagnosed with schizophrenia, respondents has been prescribed psych and pain medicine, respondents threatened to jump into traffic and off a bed in, respondents appeared to be hallucinating respondents is muffled and excessively.  Respondents is a danger to self.  Face-to-face evaluation of patient, patient is alert and oriented to person.  Upon entering the room patient is observed talking to himself mumbling.  Patient appears to be influenced by internal stimuli.  When asked questions patient would give inappropriate answers.  When asked if he knew why he was here patient stated my shoes is on my foot.  When asked if he is suicidal patient stated no patient denies HI, however patient does not seem to be cognitive enough or focus enough to answer questions appropriately.  He would give inappropriate response to anything asked.  A proper evaluation could not be assessed at this time because of patient inability to answer questions.  Recommend inpatient admission when a bed becomes available at [facility] for now patient will be held in the observation unit.  Will continue patient's home medication.  Total Time spent with patient: 20 minutes  Musculoskeletal  Strength & Muscle Tone: within normal limits Gait & Station: normal Patient leans: N/A  Psychiatric Specialty Exam  Presentation General Appearance:  Casual  Eye Contact: Fair  Speech: Garbled  Speech Volume: Decreased  Handedness: Right   Mood and Affect  Mood: Anxious;  Labile; Euthymic  Affect: Congruent   Thought Process  Thought Processes: Disorganized  Descriptions of Associations:Loose  Orientation:Partial  Thought Content:Illogical; Scattered  Diagnosis of Schizophrenia or Schizoaffective disorder in past: Yes  Duration of Psychotic Symptoms: Greater than six months  Hallucinations:Hallucinations: Other (comment) (not responding to questions)  Ideas of Reference:None  Suicidal Thoughts:Suicidal Thoughts: No  Homicidal Thoughts:Homicidal Thoughts: No   Sensorium  Memory: Immediate Poor  Judgment: Intact  Insight: Lacking   Executive Functions  Concentration: Fair  Attention Span: Fair  Recall: Poor  Fund of Knowledge: Poor  Language: Poor   Psychomotor Activity  Psychomotor Activity: Psychomotor Activity: Normal   Assets  Assets: Social Support   Sleep  Sleep: Sleep: Fair Number of Hours of Sleep: 6   Nutritional Assessment (For OBS and FBC admissions only) Has the patient had a weight loss or gain of 10 pounds or more in the last 3 months?: No Has the patient had a decrease in food intake/or appetite?: No Does the patient have dental problems?: No Does the patient have eating habits or behaviors that may be indicators of an eating disorder including binging or inducing vomiting?: No Has the patient recently lost weight without trying?: 0 Has the patient been eating poorly because of a decreased appetite?: 0 Malnutrition Screening Tool Score: 0    Physical Exam HENT:     Head: Normocephalic.     Nose: Nose normal.  Eyes:     Pupils: Pupils are equal, round, and reactive to light.  Cardiovascular:     Rate and Rhythm: Normal rate.  Pulmonary:     Effort: Pulmonary effort is normal.  Musculoskeletal:  General: Normal range of motion.     Cervical back: Normal range of motion.  Skin:    General: Skin is warm.  Neurological:     Mental Status: He is alert. Mental status is at  baseline.  Psychiatric:        Mood and Affect: Mood normal.        Behavior: Behavior normal.        Thought Content: Thought content normal.        Judgment: Judgment normal.    Review of Systems  Constitutional: Negative.   HENT: Negative.    Eyes: Negative.   Respiratory: Negative.    Cardiovascular: Negative.   Gastrointestinal: Negative.   Genitourinary: Negative.   Musculoskeletal: Negative.   Skin: Negative.   Neurological: Negative.   Psychiatric/Behavioral:  Positive for hallucinations and suicidal ideas. The patient is nervous/anxious.     There were no vitals taken for this visit. There is no height or weight on file to calculate BMI.  Past Psychiatric History: schizophrenia, aggression    Is the patient at risk to self? Yes  Has the patient been a risk to self in the past 6 months? Yes .    Has the patient been a risk to self within the distant past? Yes   Is the patient a risk to others? No   Has the patient been a risk to others in the past 6 months? No   Has the patient been a risk to others within the distant past? No   Past Medical History: see chart   Family History: unknown  Social History: unable to assess at this time  Last Labs:  Admission on 03/08/2023, Discharged on 03/10/2023  Component Date Value Ref Range Status   Sodium 03/08/2023 139  135 - 145 mmol/L Final   Potassium 03/08/2023 3.4 (L)  3.5 - 5.1 mmol/L Final   Chloride 03/08/2023 104  98 - 111 mmol/L Final   CO2 03/08/2023 22  22 - 32 mmol/L Final   Glucose, Bld 03/08/2023 114 (H)  70 - 99 mg/dL Final   Glucose reference range applies only to samples taken after fasting for at least 8 hours.   BUN 03/08/2023 18  6 - 20 mg/dL Final   Creatinine, Ser 03/08/2023 0.91  0.61 - 1.24 mg/dL Final   Calcium 34/74/2595 9.6  8.9 - 10.3 mg/dL Final   Total Protein 63/87/5643 8.0  6.5 - 8.1 g/dL Final   Albumin 32/95/1884 4.5  3.5 - 5.0 g/dL Final   AST 16/60/6301 59 (H)  15 - 41 U/L Final    ALT 03/08/2023 36  0 - 44 U/L Final   Alkaline Phosphatase 03/08/2023 52  38 - 126 U/L Final   Total Bilirubin 03/08/2023 1.8 (H)  0.3 - 1.2 mg/dL Final   GFR, Estimated 03/08/2023 >60  >60 mL/min Final   Comment: (NOTE) Calculated using the CKD-EPI Creatinine Equation (2021)    Anion gap 03/08/2023 13  5 - 15 Final   Performed at [facility], [address], [place], [place] [address]   Alcohol, Ethyl (B) 03/08/2023 <10  <10 mg/dL Final   Comment: (NOTE) Lowest detectable limit for serum alcohol is 10 mg/dL.  For medical purposes only. Performed at [facility], [address] [place], [place] [address]    Salicylate Lvl 03/08/2023 <7.0 (L)  7.0 - 30.0 mg/dL Final   Performed at [facility] [address] [place], [place] [address]  Acetaminophen (Tylenol), Serum 03/08/2023 <10 (L)  10 - 30 ug/mL Final   Comment: (NOTE) Therapeutic concentrations vary significantly. A range of 10-30 ug/mL  may be an effective concentration for many patients. However, some  are best treated at concentrations outside of this range. Acetaminophen concentrations >150 ug/mL at 4 hours after ingestion  and >50 ug/mL at 12 hours after ingestion are often associated with  toxic reactions.  Performed at [facility], [address] [place], [place] [address]    WBC 03/08/2023 6.5  4.0 - 10.5 K/uL Final   RBC 03/08/2023 5.06  4.22 - 5.81 MIL/uL Final   Hemoglobin 03/08/2023 15.0  13.0 - 17.0 g/dL Final   HCT 95/63/8756 45.8  39.0 - 52.0 % Final   MCV 03/08/2023 90.5  80.0 - 100.0 fL Final   MCH 03/08/2023 29.6  26.0 - 34.0 pg Final   MCHC 03/08/2023 32.8  30.0 - 36.0 g/dL Final   RDW 43/32/9518 13.2  11.5 - 15.5 % Final   Platelets 03/08/2023 238  150 - 400 K/uL Final   nRBC 03/08/2023 0.0  0.0 - 0.2 % Final   Performed at [facility], [address], [place], [place] [address]  Admission on  01/30/2023, Discharged on 02/02/2023  Component Date Value Ref Range Status   Sodium 01/30/2023 139  135 - 145 mmol/L Final   Potassium 01/30/2023 3.3 (L)  3.5 - 5.1 mmol/L Final   Chloride 01/30/2023 101  98 - 111 mmol/L Final   CO2 01/30/2023 25  22 - 32 mmol/L Final   Glucose, Bld 01/30/2023 146 (H)  70 - 99 mg/dL Final   Glucose reference range applies only to samples taken after fasting for at least 8 hours.   BUN 01/30/2023 13  6 - 20 mg/dL Final   Creatinine, Ser 01/30/2023 0.99  0.61 - 1.24 mg/dL Final   Calcium 03/07/1600 11.1 (H)  8.9 - 10.3 mg/dL Final   Total Protein 09/32/3557 8.0  6.5 - 8.1 g/dL Final   Albumin 32/20/2542 4.7  3.5 - 5.0 g/dL Final   AST 70/62/3762 21  15 - 41 U/L Final   ALT 01/30/2023 17  0 - 44 U/L Final   Alkaline Phosphatase 01/30/2023 49  38 - 126 U/L Final   Total Bilirubin 01/30/2023 1.4 (H)  0.3 - 1.2 mg/dL Final   GFR, Estimated 01/30/2023 >60  >60 mL/min Final   Comment: (NOTE) Calculated using the CKD-EPI Creatinine Equation (2021)    Anion gap 01/30/2023 13  5 - 15 Final   Performed at [facility], [address], [place], [place] [address]   Alcohol, Ethyl (B) 01/30/2023 <10  <10 mg/dL Final   Comment: (NOTE) Lowest detectable limit for serum alcohol is 10 mg/dL.  For medical purposes only. Performed at [facility], [address] [place], [place] [address]    Salicylate Lvl 01/30/2023 <7.0 (L)  7.0 - 30.0 mg/dL Final   Performed at [facility], [address] [place], [place] [address]   Acetaminophen (Tylenol), Serum 01/30/2023 <10 (L)  10 - 30 ug/mL Final   Comment: (NOTE) Therapeutic concentrations vary significantly. A range of 10-30 ug/mL  may be an effective concentration for many patients. However, some  are best treated at concentrations outside of this range. Acetaminophen concentrations >150 ug/mL at 4 hours after ingestion  and >50 ug/mL at 12 hours after  ingestion are often associated with  toxic reactions.  Performed at [facility], [address] [place], [place] [address]    WBC 01/30/2023 7.2  4.0 - 10.5 K/uL Final   RBC 01/30/2023 5.47  4.22 - 5.81 MIL/uL Final   Hemoglobin 01/30/2023 15.9  13.0 - 17.0 g/dL Final   HCT 62/13/0865 47.1  39.0 - 52.0 % Final   MCV 01/30/2023 86.1  80.0 - 100.0 fL Final   MCH 01/30/2023 29.1  26.0 - 34.0 pg Final   MCHC 01/30/2023 33.8  30.0 - 36.0 g/dL Final   RDW 78/46/9629 12.5  11.5 - 15.5 % Final   Platelets 01/30/2023 223  150 - 400 K/uL Final   nRBC 01/30/2023 0.0  0.0 - 0.2 % Final   Performed at [facility], [address], [place], [place] [address]   Opiates 02/01/2023 NONE DETECTED  NONE DETECTED Final   Cocaine 02/01/2023 NONE DETECTED  NONE DETECTED Final   Benzodiazepines 02/01/2023 POSITIVE (A)  NONE DETECTED Final   Amphetamines 02/01/2023 NONE DETECTED  NONE DETECTED Final   Tetrahydrocannabinol 02/01/2023 NONE DETECTED  NONE DETECTED Final   Barbiturates 02/01/2023 NONE DETECTED  NONE DETECTED Final   Comment: (NOTE) DRUG SCREEN FOR MEDICAL PURPOSES ONLY.  IF CONFIRMATION IS NEEDED FOR ANY PURPOSE, NOTIFY LAB WITHIN 5 DAYS.  LOWEST DETECTABLE LIMITS FOR URINE DRUG SCREEN Drug Class                     Cutoff (ng/mL) Amphetamine and metabolites    1000 Barbiturate and metabolites    200 Benzodiazepine                 200 Opiates and metabolites        300 Cocaine and metabolites        300 THC                            50 Performed at [facility], [address] [place], [place] [address]    TSH 01/30/2023 0.875  0.350 - 4.500 uIU/mL Final   Comment: Performed by a 3rd Generation assay with a functional sensitivity of <=0.01 uIU/mL. Performed at [facility], [address], [place], [place] [address]    SARS Coronavirus 2 by RT PCR 02/01/2023 NEGATIVE  NEGATIVE Final   Comment:  (NOTE) SARS-CoV-2 target nucleic acids are NOT DETECTED.  The SARS-CoV-2 RNA is generally detectable in upper and lower respiratory specimens during the acute phase of infection. The lowest concentration of SARS-CoV-2 viral copies this assay can detect is 250 copies / mL. A negative result does not preclude SARS-CoV-2 infection and should not be used as the sole basis for treatment or other patient management decisions.  A negative result may occur with improper specimen collection / handling, submission of specimen other than nasopharyngeal swab, presence of viral mutation(s) within the areas targeted by this assay, and inadequate number of viral copies (<250 copies / mL). A negative result must be combined with clinical observations, patient history, and epidemiological information.  Fact Sheet for Patients:   RoadLapTop.co.za  Fact Sheet for Healthcare Providers: http://kim-miller.com/  This test is not yet approved or                           cleared by the [place] FDA and has been authorized for detection and/or diagnosis of SARS-CoV-2 by FDA under an Emergency  Use Authorization (EUA).  This EUA will remain in effect (meaning this test can be used) for the duration of the COVID-19 declaration under Section 564(b)(1) of the Act, 21 U.S.C. section 360bbb-3(b)(1), unless the authorization is terminated or revoked sooner.  Performed at [facility], [address] [place], [place] [address]     Allergies: Amoxicillin, Dextromethorphan-guaifenesin, Haloperidol, Meloxicam, Nsaids, Quetiapine, Sudafed [pseudoephedrine hcl], Ziprasidone, and Prednisone  Medications:  PTA Medications  Medication Sig   albuterol (VENTOLIN HFA) 108 (90 Base) MCG/ACT inhaler Inhale 2 puffs into the lungs every 4 (four) hours as needed for wheezing or shortness of breath. (Patient not taking: Reported on 03/09/2023)   chlorproMAZINE  (THORAZINE) 100 MG tablet Take 1 tablet (100 mg total) by mouth at bedtime. (Patient not taking: Reported on 03/09/2023)   chlorproMAZINE (THORAZINE) 50 MG tablet Take 1 tablet (50 mg total) by mouth in the morning. (Patient not taking: Reported on 03/09/2023)      Medical Decision Making  Inpatient observation until a bed become available at [facility]   Lab Orders         SARS Coronavirus 2 by RT PCR (hospital order, performed in [facility] hospital lab) *cepheid single result test* Anterior Nasal Swab         CBC with Differential/Platelet         Comprehensive metabolic panel         Ethanol         TSH         POCT Urine Drug Screen - (I-Screen)      Meds ordered this encounter  Medications   acetaminophen (TYLENOL) tablet 650 mg   alum & mag hydroxide-simeth (MAALOX/MYLANTA) 200-200-20 MG/5ML suspension 30 mL   magnesium hydroxide (MILK OF MAGNESIA) suspension 30 mL   AND Linked Order Group    OLANZapine zydis (ZYPREXA) disintegrating tablet 10 mg    LORazepam (ATIVAN) tablet 1 mg     Recommendations  Based on my evaluation the patient does not appear to have an emergency medical condition.  Sindy Guadeloupe, NP 07/08/23  8:33 PM

## 2023-07-08 NOTE — BH Assessment (Signed)
Comprehensive Clinical Assessment (CCA) Note   07/08/2023 [place] A Chagnon [id number]  Disposition: Sindy Guadeloupe, NP recommends inpatient hospitalization.   The patient demonstrates the following risk factors for suicide: Chronic risk factors for suicide include: psychiatric disorder of Schizophrenia . Acute risk factors for suicide include: family or marital conflict. Protective factors for this patient include: positive social support. Considering these factors, the overall suicide risk at this point appears to be moderate. Patient is not appropriate for outpatient follow up.   Pt is 52 yo who present to [employer] under an IVC. Per IVC, pt's son reports that "pt is diagnosed with schizopheronia and is prescribe psych and pain medications. Pt threaten to jump into traffic and off a building. Pt appears to be hallunicating and is mumbling excessively."  Pt presents guarded and confused. Pt was rambling and unable to answer question appropriately. Pt denies SI, HI, and AVH but reports getting ready for his funeral.    Chief Complaint: IVC  Visit Diagnosis:   Schizophrenia Disorder     CCA Screening, Triage and Referral (STR)  Patient Reported Information How did you hear about [place]? Legal System  What Is the Reason for Your Visit/Call Today? Pt is 52 yo who present to [facility] under an IVC. Per IVC, pt's son reports that pt is diagnosed with schizopheronia and is prescribe psych and pain medications. Pt threaten to jump into traffic and off a building. Pt appears to be hallunicating and is mumbling excessively. Pt denies SI, HI, and AVH but reports getting ready for his funeral.  How Long Has This Been Causing You Problems? > than 6 months  What Do You Feel Would Help You the Most Today? Treatment for Depression or other mood problem; Stress Management; Medication(s)   Have You Recently Had Any Thoughts About Hurting Yourself? Yes  Are You Planning to Commit Suicide/Harm Yourself At This time?  Yes   Flowsheet Row ED from 07/08/2023 in [facility] ED from 03/08/2023 in [facility] Emergency Department at [facility] ED from 11/08/2022 in [facility] Emergency Department at [facility]  C-SSRS RISK CATEGORY Moderate Risk No Risk No Risk       Have you Recently Had Thoughts About Hurting Someone Karolee Ohs? No  Are You Planning to Harm Someone at This Time? No  Explanation: Pt denies HI   Have You Used Any Alcohol or Drugs in the Past 24 Hours? No  What Did You Use and How Much? Pt denies drug use   Do You Currently Have a Therapist/Psychiatrist? No  Name of Therapist/Psychiatrist: Name of Therapist/Psychiatrist: Pt denies having a therapist   Have You Been Recently Discharged From Any Office Practice or Programs? No  Explanation of Discharge From Practice/Program: n/a     CCA Screening Triage Referral Assessment Type of Contact: Face-to-Face  Telemedicine Service Delivery:   Is this Initial or Reassessment?   Date Telepsych consult ordered in [facility]:    Time Telepsych consult ordered in [facility]:    Location of Assessment: [facility] Services  Provider Location: [facility] Assessment Services   Collateral Involvement: None   Does Patient Have a Automotive engineer Guardian? No  Legal Guardian Contact Information: n/a  Copy of Legal Guardianship Form: -- (n/a)  Legal Guardian Notified of Arrival: -- (n/a)  Legal Guardian Notified of Pending Discharge: -- (n/a)  If Minor and Not Living with Parent(s), Who has Custody? n/a  Is CPS involved or ever been involved? Never  Is APS involved or ever been involved? Never   Patient Determined To Be At Risk for Harm To Self or Others Based on Review of Patient Reported Information or Presenting Complaint? Yes, for Self-Harm  Method: Plan without intent  Availability of Means: No access or NA  Intent: Vague intent or NA  Notification Required: No need or identified  person  Additional Information for Danger to Others Potential: -- (n/a)  Additional Comments for Danger to Others Potential: n/a  Are There Guns or Other Weapons in Your Home? -- (UTA)  Types of Guns/Weapons: UTA  Are These Weapons Safely Secured?                            -- (UTA)  Who Could Verify You Are Able To Have These Secured: UTA  Do You Have any Outstanding Charges, Pending Court Dates, Parole/Probation? Pt denies pending legal charges  Contacted To Inform of Risk of Harm To Self or Others: -- (n/a)    Does Patient Present under Involuntary Commitment? Yes    [place] of Residence: [place]   Patient Currently Receiving the Following Services: Not Receiving Services   Determination of Need: Urgent (48 hours)   Options For Referral: Inpatient Hospitalization     CCA Biopsychosocial Patient Reported Schizophrenia/Schizoaffective Diagnosis in Past: Yes   Strengths: Unable to assess   Mental Health Symptoms Depression:   Change in energy/activity; Difficulty Concentrating; Increase/decrease in appetite; Irritability; Sleep (too much or little); Weight gain/loss   Duration of Depressive symptoms:  Duration of Depressive Symptoms: Greater than two weeks   Mania:   None   Anxiety:    Difficulty concentrating; Fatigue; Irritability; Restlessness; Sleep; Tension; Worrying   Psychosis:   Delusions; Hallucinations   Duration of Psychotic symptoms:    Trauma:   None   Obsessions:   None   Compulsions:   None   Inattention:   None   Hyperactivity/Impulsivity:   None   Oppositional/Defiant Behaviors:   Aggression towards people/animals; Angry; Temper; Easily annoyed   Emotional Irregularity:   Intense/inappropriate anger; Potentially harmful impulsivity   Other Mood/Personality Symptoms:   None noted    Mental Status Exam Appearance and self-care  Stature:   Average   Weight:   Thin   Clothing:   -- (Scrubs)   Grooming:    Neglected   Cosmetic use:   None   Posture/gait:   Normal   Motor activity:   Not Remarkable   Sensorium  Attention:   -- (Drowsy)   Concentration:   Variable   Orientation:   X5   Recall/memory:   Normal   Affect and Mood  Affect:   Appropriate   Mood:   Euthymic   Relating  Eye contact:   Fleeting   Facial expression:   Responsive   Attitude toward examiner:   Cooperative   Thought and Language  Speech flow:  Garbled   Thought content:   Delusions; Persecutions   Preoccupation:   None   Hallucinations:   Visual   Organization:   Insurance underwriter of Knowledge:   Fair   Intelligence:   Average   Abstraction:   Concrete   Judgement:   Poor   Reality Testing:   Distorted   Insight:   Poor   Decision Making:   Impulsive   Social Functioning  Social Maturity:   Impulsive; Isolates   Social Judgement:   Heedless  Stress  Stressors:   Financial   Coping Ability:   Exhausted   Skill Deficits:   Decision making; Responsibility; Self-control   Supports:   Support needed     Religion: Religion/Spirituality Are You A Religious Person?: No (Unable to assess, Pt drowsy) How Might This Affect Treatment?: Unable to assess, Pt presents with mumbling speech  Leisure/Recreation: Leisure / Recreation Do You Have Hobbies?: No (Unable to assess, Pt drowsy)  Exercise/Diet: Exercise/Diet Do You Exercise?: No Have You Gained or Lost A Significant Amount of Weight in the Past Six Months?: Yes-Lost Number of Pounds Lost?:  (unknown) Do You Follow a Special Diet?: No Type of Diet: n/a Do You Have Any Trouble Sleeping?: No Explanation of Sleeping Difficulties: n/a   CCA Employment/Education Employment/Work Situation: Employment / Work Situation Employment Situation: On disability Why is Patient on Disability: Mental Health How Long has Patient Been on Disability: UTA Patient's Job has Been  Impacted by Current Illness: No (Pt on disability) Has Patient ever Been in the [employer]?: No  Education: Education Is Patient Currently Attending School?: No Last Grade Completed: 12 Did You Attend College?: No Did You Have An Individualized Education Program (IIEP): No Did You Have Any Difficulty At School?: No Patient's Education Has Been Impacted by Current Illness: No   CCA Family/Childhood History Family and Relationship History: Family history Marital status: Single Does patient have children?: Yes (Unable to assess, Pt not cooperative) How many children?:  (UTA) How is patient's relationship with their children?: unable to assess  Childhood History:  Childhood History By whom was/is the patient raised?: Mother Did patient suffer any verbal/emotional/physical/sexual abuse as a child?:  (Unable to assess, Pt not cooperative.... rambling speech.) Did patient suffer from severe childhood neglect?:  (Unable to assess, Pt not cooperative.... rambling speech.) Has patient ever been sexually abused/assaulted/raped as an adolescent or adult?:  (Unable to assess. Pt not cooperative.... rambling speech.) Was the patient ever a victim of a crime or a disaster?:  (Unable to assess. Pt not cooperative.... rambling speech.) Witnessed domestic violence?:  (Unable to assess. Pt not cooperative.... rambling speech.) Has patient been affected by domestic violence as an adult?:  (Unable to assess. Pt not cooperative.... rambling speech.)       CCA Substance Use Alcohol/Drug Use: Alcohol / Drug Use Pain Medications: Denies abuse Prescriptions: Denies abuse Over the Counter: Denies abuse History of alcohol / drug use?: No history of alcohol / drug abuse Longest period of sobriety (when/how long): Pt denies substance use Negative Consequences of Use:  (Pt denies substance use) Withdrawal Symptoms: None                         ASAM's:  Six Dimensions of Multidimensional  Assessment  Dimension 1:  Acute Intoxication and/or Withdrawal Potential:   Dimension 1:  Description of individual's past and current experiences of substance use and withdrawal: Pt denies substance use  Dimension 2:  Biomedical Conditions and Complications:   Dimension 2:  Description of patient's biomedical conditions and  complications: Pt denies substance use  Dimension 3:  Emotional, Behavioral, or Cognitive Conditions and Complications:  Dimension 3:  Description of emotional, behavioral, or cognitive conditions and complications: Pt denies substance use  Dimension 4:  Readiness to Change:  Dimension 4:  Description of Readiness to Change criteria: Pt denies substance use  Dimension 5:  Relapse, Continued use, or Continued Problem Potential:  Dimension 5:  Relapse, continued use, or continued problem potential  critiera description: Pt denies substance use  Dimension 6:  Recovery/Living Environment:  Dimension 6:  Recovery/Iiving environment criteria description: Pt denies substance use  ASAM Severity Score: ASAM's Severity Rating Score: 0  ASAM Recommended Level of Treatment: ASAM Recommended Level of Treatment:  (Pt denies substance use)   Substance use Disorder (SUD) Substance Use Disorder (SUD)  Checklist Symptoms of Substance Use:  (NA)  Recommendations for Services/Supports/Treatments: Recommendations for Services/Supports/Treatments Recommendations For Services/Supports/Treatments: Inpatient Hospitalization  Discharge Disposition:    DSM5 Diagnoses: Patient Active Problem List   Diagnosis Date Noted   Chronic neck pain 09/23/2021   Kyphosis 09/23/2021   Osteoarthritis of spine 09/23/2021   Schizophrenia (HCC) 09/18/2021   Localized, primary osteoarthritis 09/18/2021   Osteoarthritis of knee 09/18/2021   Need for influenza vaccination 06/18/2021   Pain in thoracic spine 06/05/2021   Common wart 09/09/2020   Opioid type dependence, continuous (HCC) 06/15/2020   MDD  (major depressive disorder), recurrent severe, 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   Acute respiratory failure with hypoxia (HCC)    Polysubstance overdose 02/26/2019   Laceration of Achilles tendon, right, initial encounter    Shock (HCC) 08/28/2018   Suicide attempt (HCC)    Benzodiazepine (tranquilizer) overdose, intentional self-harm, initial encounter (HCC) 06/13/2018   Overdose of benzodiazepine, intentional self-harm, initial encounter (HCC)    Somnolence    Schizophrenia, paranoid type (HCC) 08/19/2017   Chronic pain of left knee 08/13/2016   Shortness of breath 03/26/2016   Smoking greater than 30 pack years 03/26/2016   Acne vulgaris 10/24/2015   Abnormal blood chemistry 10/24/2015   Caffeine toxicity (HCC) 10/24/2015   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   Chronic diarrhea 10/24/2015   Esophageal reflux 10/24/2015   Fatigue 10/24/2015   Generalized anxiety disorder 10/24/2015   Long term (current) use of opiate analgesic 10/24/2015   Hypokalemia 04/28/2014   Drug overdose 04/27/2014   Schizoaffective disorder, bipolar type (HCC) 03/02/2014   Anxiety state 03/02/2014   Benzodiazepine dependence (HCC) 03/02/2014   Chest pain 10/11/2013   EKG abnormalities 10/11/2013   Chronic back pain      Referrals to Alternative Service(s): Referred to Alternative Service(s):   Place:   Date:   Time:    Referred to Alternative Service(s):   Place:   Date:   Time:    Referred to Alternative Service(s):   Place:   Date:   Time:    Referred to Alternative Service(s):   Place:   Date:   Time:     Dava Najjar, [place], [facility], NCC

## 2023-07-08 NOTE — ED Notes (Signed)
Pt asleep at this hour. No apparent distress. RR even and unlabored. Monitored for safety.  

## 2023-07-08 NOTE — ED Notes (Signed)
This nurse went to speak with patient again to see if he is ready to be for the skin check and to come on the unit, the patient is still refusing.

## 2023-07-08 NOTE — ED Notes (Addendum)
This nurse and MHT Rex Kras went to the patients room to complete intake assessment. The patient refused to allow [place] to draw blood, obtain a urine sample, or skin check. We advised the patient that in the minimum a skin check is needed to come on the unit. The patient stated he does not want to come on the unit.

## 2023-07-08 NOTE — ED Notes (Signed)
Pt here under IVC. Pt denies SI, HI, AVH and  pain. Pt refused VS assessment and skin search and bloodwork initially and after a few tries to get him to comply. Pt finally agreed to comply with skin search. Pt allowed his BP, pulse and RR to be checked but refused temperature check. Pt refusing food and drink. Pt has open sore to lateral aspect of his left great toe. Bandage placed over it. Pt is homeless. Pt denies seeing or hearing voices and paranoia. Pt in Flex Bed 4

## 2023-07-09 DIAGNOSIS — F200 Paranoid schizophrenia: Secondary | ICD-10-CM | POA: Diagnosis not present

## 2023-07-09 MED ORDER — OLANZAPINE 5 MG PO TBDP
5.0000 mg | ORAL_TABLET | Freq: Two times a day (BID) | ORAL | Status: DC
  Administered 2023-07-11: 5 mg via ORAL
  Filled 2023-07-09 (×4): qty 1

## 2023-07-09 MED ORDER — BACITRACIN ZINC 500 UNIT/GM EX OINT
1.0000 | TOPICAL_OINTMENT | Freq: Two times a day (BID) | CUTANEOUS | Status: DC
  Administered 2023-07-09 – 2023-07-11 (×3): 1 via TOPICAL
  Filled 2023-07-09: qty 28.35

## 2023-07-09 MED ORDER — OLANZAPINE 10 MG IM SOLR
10.0000 mg | INTRAMUSCULAR | Status: AC
  Administered 2023-07-09: 10 mg via INTRAMUSCULAR
  Filled 2023-07-09: qty 10

## 2023-07-09 NOTE — ED Notes (Signed)
Pt toileted. Refused to urinate in the toilet because "there's water in there". Requested a cup to urinate in so he could throw it outside.

## 2023-07-09 NOTE — ED Notes (Signed)
Patient resting with eyes closed. Respirations even and unlabored. No acute distress noted. Environment secured. Will continue to monitor for safety.
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
## 2023-07-09 NOTE — ED Notes (Signed)
Pt is currently sleeping, no distress noted, environmental check complete, will continue to monitor patient for safety.  

## 2023-07-09 NOTE — ED Notes (Signed)
Pt offered food and fluids, refuses all offerings.

## 2023-07-09 NOTE — Progress Notes (Signed)
LCSW Progress Note  [id number]   [name]  07/09/2023  1:25 PM  Description:   Inpatient Psychiatric Referral  Patient was recommended inpatient per Joaquin Courts  There are no available beds at [facility], per [facility] Patient was referred to the following out of network facilities:    Destination  Service Provider Address Phone Fax  [facility]  [address], [place] [address] [phone]  [facility]  [address] [place] [address] [phone]  [facility] [address]  [place] [address] [phone]  [facility]  [address] [place] [address] [phone]  [facility] [address]  [address], [place] [address] [phone]  [facility]  [address] [place], [place] [address] [phone]  [facility]  [address], [place] [address] [phone]  [facility]  [address] [place] [address] [phone]  [facility]  [address] [place] [address] [phone]  [facility]  [address] [place] [address] [phone]  [facility]  [address] [place] [address] [phone]  [facility]  [address] [place] [address] [phone]  [facility] EFAX  [address] [place], [place] [phone]  [facility]  [address] [place] [address] [phone]  [facility]  [address] [place] [address] [phone]  [facility]  [address] [place] [address] [phone]   [facility]  [address] [place] [address] [phone]  [facility] EFAX  [place] [phone]      Situation ongoing, CSW to continue following and update chart as more information becomes available.      [place] Shyan Scalisi LCSW-A   07/09/2023 1:25 PM

## 2023-07-09 NOTE — ED Notes (Signed)
Pt refused vitals 

## 2023-07-09 NOTE — ED Notes (Signed)
Denies intent to harm self/others when asked. Denies A/VH. Patient denies any physical complaints when asked. No acute distress noted. Support and encouragement provided. Routine safety checks conducted according to facility protocol. Encouraged patient to notify staff if thoughts of harm toward self or others arise. Will continue to monitor for safety.

## 2023-07-09 NOTE — ED Notes (Signed)
Pt offered water, juice, and coffee. Does not want anything to drink. Pt offered breakfast, reports not being able to eat d/t poor dentition. Pt was offered soft foods, refused foods. States "you people put things in the food that make me shit, I'm not taking nothing from you all".

## 2023-07-09 NOTE — Progress Notes (Signed)
To summarize the shift's progress: the patient has not consumed any fluids or food, has not provided any output, and continues to refuse laboratory tests during this shift.

## 2023-07-09 NOTE — ED Notes (Signed)
Re-asked pt if he would allow [place] to obtain blood for labs. Continues to refuse.

## 2023-07-09 NOTE — ED Notes (Signed)
Pt offered food and fluids, continues to refuse.

## 2023-07-09 NOTE — ED Notes (Signed)
Pt refused breakfast 

## 2023-07-09 NOTE — ED Notes (Signed)
Informed pt that blood draw was needed for labs, pt refused lad draw. Nurse educated pt on importance of checking lab values, continued to refuse.

## 2023-07-09 NOTE — ED Provider Notes (Signed)
Behavioral Health Progress Note  Date and Time: 07/09/2023 1:08 PM Name: [name] MRN:  [id number]  Subjective:  "Something is wrong with your voice"  Diagnosis:  Final diagnoses:  Disorganized schizophrenia (HCC)  Behavior concern in adult  Disorganized thought process    Total Time spent with patient: 30 minutes   Admission HPI: [name], 52 y/o male with a history of schizophrenia, aggressive behavior, homicidal ideation, presented to [facility] via [employer] under IVC.  Per the IVC respondent has been diagnosed with schizophrenia, respondents has been prescribed psych and pain medicine, respondents threatened to jump into traffic and off a bed in, respondents appeared to be hallucinating respondents is muffled and excessively.  Respondents is a danger to self.   Re-evaluation: [name] 52 year old reevaluated today and states, "Something is wrong with your voice".  When this writer was explaining to patient that I was lowering my voice to prevent from disturbing other patients on the unit.  When this writer asked why was he brought to this facility patient stated" a man dressed in a bag imitating a police officer brought me here and you people or keep me here.  Patient states "no I do not sleep, I do not eat and I do not want anything to eat".  Patient begins to raise his voice become agitated this Clinical research associate terminating interview patient has a history of physical and verbal aggression.  Patient is currently under IVC petition has been recommended for inpatient psychiatric treatment.  [facility] currently has no appropriate bed availability for patient and he has been faxed out to appropriate facilities.  Patient was started olanzapine 5 mg twice daily however has refused to take any medications.  Patient did receive a one-time dose of olanzapine 10 mg IM cooperative with medication administrated.   Past Psychiatric History: See admission HPI Past Medical History: See admission HPI Family  History: See admission HPI Family Psychiatric  History: See admission HPI social History: See admission HPI  Additional Social History:    Pain Medications: Denies abuse Prescriptions: Denies abuse Over the Counter: Denies abuse History of alcohol / drug use?: No history of alcohol / drug abuse Longest period of sobriety (when/how long): Pt denies substance use Negative Consequences of Use:  (Pt denies substance use) Withdrawal Symptoms: None                    Sleep: Fair  Appetite:  Fair  Current Medications:  Current Facility-Administered Medications  Medication Dose Route Frequency Provider Last Rate Last Admin   acetaminophen (TYLENOL) tablet 650 mg  650 mg Oral Q6H PRN Sindy Guadeloupe, NP       alum & mag hydroxide-simeth (MAALOX/MYLANTA) 200-200-20 MG/5ML suspension 30 mL  30 mL Oral Q4H PRN Sindy Guadeloupe, NP       bacitracin ointment 1 Application  1 Application Topical BID Bing Neighbors, NP       OLANZapine zydis (ZYPREXA) disintegrating tablet 10 mg  10 mg Oral Q8H PRN Sindy Guadeloupe, NP       And   LORazepam (ATIVAN) tablet 1 mg  1 mg Oral PRN Sindy Guadeloupe, NP       magnesium hydroxide (MILK OF MAGNESIA) suspension 30 mL  30 mL Oral Daily PRN Sindy Guadeloupe, NP       OLANZapine (ZYPREXA) injection 10 mg  10 mg Intramuscular NOW Bing Neighbors, NP       OLANZapine zydis (ZYPREXA) disintegrating tablet 5 mg  5 mg  Oral BID Bing Neighbors, NP       Current Outpatient Medications  Medication Sig Dispense Refill   albuterol (VENTOLIN HFA) 108 (90 Base) MCG/ACT inhaler Inhale 2 puffs into the lungs every 4 (four) hours as needed for wheezing or shortness of breath. (Patient not taking: Reported on 03/09/2023) 18 g 1   chlorproMAZINE (THORAZINE) 100 MG tablet Take 1 tablet (100 mg total) by mouth at bedtime. (Patient not taking: Reported on 03/09/2023) 30 tablet 0   chlorproMAZINE (THORAZINE) 50 MG tablet Take 1 tablet (50 mg total) by mouth in the morning.  (Patient not taking: Reported on 03/09/2023) 30 tablet 0    Labs  Lab Results:  Admission on 03/08/2023, Discharged on 03/10/2023  Component Date Value Ref Range Status   Sodium 03/08/2023 139  135 - 145 mmol/L Final   Potassium 03/08/2023 3.4 (L)  3.5 - 5.1 mmol/L Final   Chloride 03/08/2023 104  98 - 111 mmol/L Final   CO2 03/08/2023 22  22 - 32 mmol/L Final   Glucose, Bld 03/08/2023 114 (H)  70 - 99 mg/dL Final   Glucose reference range applies only to samples taken after fasting for at least 8 hours.   BUN 03/08/2023 18  6 - 20 mg/dL Final   Creatinine, Ser 03/08/2023 0.91  0.61 - 1.24 mg/dL Final   Calcium 86/57/8469 9.6  8.9 - 10.3 mg/dL Final   Total Protein 62/95/2841 8.0  6.5 - 8.1 g/dL Final   Albumin 32/44/0102 4.5  3.5 - 5.0 g/dL Final   AST 72/53/6644 59 (H)  15 - 41 U/L Final   ALT 03/08/2023 36  0 - 44 U/L Final   Alkaline Phosphatase 03/08/2023 52  38 - 126 U/L Final   Total Bilirubin 03/08/2023 1.8 (H)  0.3 - 1.2 mg/dL Final   GFR, Estimated 03/08/2023 >60  >60 mL/min Final   Comment: (NOTE) Calculated using the CKD-EPI Creatinine Equation (2021)    Anion gap 03/08/2023 13  5 - 15 Final   Performed at [facility], [address] [place], [place] [address]   Alcohol, Ethyl (B) 03/08/2023 <10  <10 mg/dL Final   Comment: (NOTE) Lowest detectable limit for serum alcohol is 10 mg/dL.  For medical purposes only. Performed at [facility], [address] [place], [place] [address]    Salicylate Lvl 03/08/2023 <7.0 (L)  7.0 - 30.0 mg/dL Final   Performed at [facility], [address] [place], [place] [address]   Acetaminophen (Tylenol), Serum 03/08/2023 <10 (L)  10 - 30 ug/mL Final   Comment: (NOTE) Therapeutic concentrations vary significantly. A range of 10-30 ug/mL  may be an effective concentration for many patients. However, some  are best treated at concentrations outside of this  range. Acetaminophen concentrations >150 ug/mL at 4 hours after ingestion  and >50 ug/mL at 12 hours after ingestion are often associated with  toxic reactions.  Performed at [facility], [address] [place], [place] [address]    WBC 03/08/2023 6.5  4.0 - 10.5 K/uL Final   RBC 03/08/2023 5.06  4.22 - 5.81 MIL/uL Final   Hemoglobin 03/08/2023 15.0  13.0 - 17.0 g/dL Final   HCT 51/88/4166 45.8  39.0 - 52.0 % Final   MCV 03/08/2023 90.5  80.0 - 100.0 fL Final   MCH 03/08/2023 29.6  26.0 - 34.0 pg Final   MCHC 03/08/2023 32.8  30.0 - 36.0 g/dL Final   RDW  03/08/2023 13.2  11.5 - 15.5 % Final   Platelets 03/08/2023 238  150 - 400 K/uL Final   nRBC 03/08/2023 0.0  0.0 - 0.2 % Final   Performed at [facility], [address], [place], [place] [address]  Admission on 01/30/2023, Discharged on 02/02/2023  Component Date Value Ref Range Status   Sodium 01/30/2023 139  135 - 145 mmol/L Final   Potassium 01/30/2023 3.3 (L)  3.5 - 5.1 mmol/L Final   Chloride 01/30/2023 101  98 - 111 mmol/L Final   CO2 01/30/2023 25  22 - 32 mmol/L Final   Glucose, Bld 01/30/2023 146 (H)  70 - 99 mg/dL Final   Glucose reference range applies only to samples taken after fasting for at least 8 hours.   BUN 01/30/2023 13  6 - 20 mg/dL Final   Creatinine, Ser 01/30/2023 0.99  0.61 - 1.24 mg/dL Final   Calcium 25/36/6440 11.1 (H)  8.9 - 10.3 mg/dL Final   Total Protein 34/74/2595 8.0  6.5 - 8.1 g/dL Final   Albumin 63/87/5643 4.7  3.5 - 5.0 g/dL Final   AST 32/95/1884 21  15 - 41 U/L Final   ALT 01/30/2023 17  0 - 44 U/L Final   Alkaline Phosphatase 01/30/2023 49  38 - 126 U/L Final   Total Bilirubin 01/30/2023 1.4 (H)  0.3 - 1.2 mg/dL Final   GFR, Estimated 01/30/2023 >60  >60 mL/min Final   Comment: (NOTE) Calculated using the CKD-EPI Creatinine Equation (2021)    Anion gap 01/30/2023 13  5 - 15 Final   Performed at [facility], [address], [place], [place] [address]   Alcohol, Ethyl (B) 01/30/2023 <10  <10 mg/dL Final   Comment: (NOTE) Lowest detectable limit for serum alcohol is 10 mg/dL.  For medical purposes only. Performed at [facility], [address] [place], [place] [address]    Salicylate Lvl 01/30/2023 <7.0 (L)  7.0 - 30.0 mg/dL Final   Performed at [facility] [address], [place], [place] [address]   Acetaminophen (Tylenol), Serum 01/30/2023 <10 (L)  10 - 30 ug/mL Final   Comment: (NOTE) Therapeutic concentrations vary significantly. A range of 10-30 ug/mL  may be an effective concentration for many patients. However, some  are best treated at concentrations outside of this range. Acetaminophen concentrations >150 ug/mL at 4 hours after ingestion  and >50 ug/mL at 12 hours after ingestion are often associated with  toxic reactions.  Performed at [facility], [address], [place], [place] [address]    WBC 01/30/2023 7.2  4.0 - 10.5 K/uL Final   RBC 01/30/2023 5.47  4.22 - 5.81 MIL/uL Final   Hemoglobin 01/30/2023 15.9  13.0 - 17.0 g/dL Final   HCT 22/10/5425 47.1  39.0 - 52.0 % Final   MCV 01/30/2023 86.1  80.0 - 100.0 fL Final   MCH 01/30/2023 29.1  26.0 - 34.0 pg Final   MCHC 01/30/2023 33.8  30.0 - 36.0 g/dL Final   RDW 03/01/7627 12.5  11.5 - 15.5 % Final   Platelets 01/30/2023 223  150 - 400 K/uL Final   nRBC 01/30/2023 0.0  0.0 - 0.2 % Final   Performed at [facility], [address] [place], [place] [address]   Opiates 02/01/2023 NONE DETECTED  NONE DETECTED Final   Cocaine 02/01/2023 NONE DETECTED  NONE DETECTED Final   Benzodiazepines 02/01/2023 POSITIVE (A)  NONE  DETECTED Final   Amphetamines 02/01/2023 NONE DETECTED  NONE DETECTED Final   Tetrahydrocannabinol 02/01/2023 NONE DETECTED  NONE DETECTED Final   Barbiturates 02/01/2023 NONE DETECTED  NONE DETECTED Final   Comment: (NOTE) DRUG SCREEN FOR  MEDICAL PURPOSES ONLY.  IF CONFIRMATION IS NEEDED FOR ANY PURPOSE, NOTIFY LAB WITHIN 5 DAYS.  LOWEST DETECTABLE LIMITS FOR URINE DRUG SCREEN Drug Class                     Cutoff (ng/mL) Amphetamine and metabolites    1000 Barbiturate and metabolites    200 Benzodiazepine                 200 Opiates and metabolites        300 Cocaine and metabolites        300 THC                            50 Performed at [facility], [address] [place], [place] [address]    TSH 01/30/2023 0.875  0.350 - 4.500 uIU/mL Final   Comment: Performed by a 3rd Generation assay with a functional sensitivity of <=0.01 uIU/mL. Performed at [facility], [address], [place], [place] [address]    SARS Coronavirus 2 by RT PCR 02/01/2023 NEGATIVE  NEGATIVE Final   Comment: (NOTE) SARS-CoV-2 target nucleic acids are NOT DETECTED.  The SARS-CoV-2 RNA is generally detectable in upper and lower respiratory specimens during the acute phase of infection. The lowest concentration of SARS-CoV-2 viral copies this assay can detect is 250 copies / mL. A negative result does not preclude SARS-CoV-2 infection and should not be used as the sole basis for treatment or other patient management decisions.  A negative result may occur with improper specimen collection / handling, submission of specimen other than nasopharyngeal swab, presence of viral mutation(s) within the areas targeted by this assay, and inadequate number of viral copies (<250 copies / mL). A negative result must be combined with clinical observations, patient history, and epidemiological information.  Fact Sheet for Patients:   RoadLapTop.co.za  Fact Sheet for Healthcare Providers: http://kim-miller.com/  This test is not yet approved or                           cleared by the [place] FDA and has been authorized for detection and/or diagnosis of  SARS-CoV-2 by FDA under an Emergency Use Authorization (EUA).  This EUA will remain in effect (meaning this test can be used) for the duration of the COVID-19 declaration under Section 564(b)(1) of the Act, 21 U.S.C. section 360bbb-3(b)(1), unless the authorization is terminated or revoked sooner.  Performed at [facility], [address], [place], [place] [address]     Blood Alcohol level:  Lab Results  Component Value Date   [facility] <10 05/01/2023   ETH <10 03/08/2023    Metabolic Disorder Labs: Lab Results  Component Value Date   HGBA1C 5.5 09/14/2021   MPG 111.15 09/14/2021   MPG 114.02 01/17/2020   Lab Results  Component Value Date   PROLACTIN 20.8 (H) 09/14/2021   PROLACTIN 16.1 (H) 01/17/2020   Lab Results  Component Value Date   CHOL 185 09/14/2021   TRIG 137 09/14/2021   HDL 44 09/14/2021   CHOLHDL 4.2 09/14/2021   VLDL 27 09/14/2021   LDLCALC 114 (H) 09/14/2021  LDLCALC 84 01/17/2020    Therapeutic Lab Levels: No results found for: "LITHIUM" Lab Results  Component Value Date   VALPROATE <10 (L) 12/07/2020   VALPROATE 43 (L) 01/31/2020   Lab Results  Component Value Date   CBMZ 11.7 04/29/2014   CBMZ 16.0 (HH) 04/28/2014    Physical Findings   AIMS    Flowsheet Row [facility]) [facility] Ad[facility] (Discharged) [facility] Admission (Discharged) [facility]  AIMS Total Score 0 0 0      AUDIT    Flowsheet Row Ad[facility]Dis[facility]) [facility] Admission (Discharged) [facility] Admission (Discharged) [facility] ED to Hosp-Admission (Discharged) from [facility]   Alcohol Use Disorder Identification Test Final Score (AUDIT) 0 0 0 10      Flowsheet Row ED from 07/08/2023 in [facility] ED from 03/08/2023 in [facility] at [facility] ED from 11/08/2022 in [facility] at [facility]  C-SSRS RISK CATEGORY Moderate Risk No Risk No Risk        Musculoskeletal  Strength & Muscle Tone: within normal limits Gait & Station: normal Patient leans: N/A  Psychiatric Specialty Exam  Presentation  General Appearance:  Casual  Eye Contact: Fair  Speech: Garbled  Speech Volume: Decreased  Handedness: Right   Mood and Affect  Mood: Anxious; Labile; Euthymic  Affect: Congruent   Thought Process  Thought Processes: Disorganized  Descriptions of Associations:Loose  Orientation:Partial  Thought Content:Illogical; Scattered  Diagnosis of Schizophrenia or Schizoaffective disorder in past: Yes  Duration of Psychotic Symptoms: Greater than six months   Hallucinations:Hallucinations: Other (comment) (not responding to questions)  Ideas of Reference:None  Suicidal Thoughts:Suicidal Thoughts: No  Homicidal Thoughts:Homicidal Thoughts: No   Sensorium  Memory: Immediate Poor  Judgment: Intact  Insight: Lacking   Executive Functions  Concentration: Fair  Attention Span: Fair  Recall: Poor  Fund of Knowledge: Poor  Language: Poor   Psychomotor Activity  Psychomotor Activity: Psychomotor Activity: Normal   Assets  Assets: Social Support   Sleep  Sleep: Sleep: Fair Number of Hours of Sleep: 6   Nutritional Assessment (For OBS and FBC admissions only) Has the patient had a weight loss or gain of 10 pounds or more in the last 3 months?: No Has the patient had a decrease in food intake/or appetite?: No Does the patient have dental problems?: No Does the patient have eating habits or behaviors that may be indicators of  an eating disorder including binging or inducing vomiting?: No Has the patient recently lost weight without trying?: 0 Has the patient been eating poorly because of a decreased appetite?: 0 Malnutrition Screening Tool Score: 0    Physical Exam  Physical Exam Vitals reviewed.  Constitutional:      Appearance: Normal appearance.  HENT:     Head: Normocephalic and atraumatic.  Eyes:     Extraocular Movements: Extraocular movements intact.     Conjunctiva/sclera: Conjunctivae normal.     Pupils: Pupils are equal, round, and reactive to light.  Cardiovascular:     Rate and Rhythm: Normal rate and regular rhythm.  Pulmonary:     Effort: Pulmonary effort is normal.     Breath sounds:  Normal breath sounds.  Musculoskeletal:        General: Normal range of motion.  Skin:    General: Skin is warm and dry.     Capillary Refill: Capillary refill takes less than 2 seconds.  Neurological:     General: No focal deficit present.     Mental Status: He is alert.    Review of Systems  Psychiatric/Behavioral:  Positive for hallucinations.    Blood pressure 101/64, pulse 61, resp. rate 16, SpO2 96%. There is no height or weight on file to calculate BMI.  Treatment Plan Summary: Daily contact with patient to assess and evaluate symptoms and progress in treatment, Medication management, and Plan patient recommended for inpatient psychiatric treatment.    1. Disorganized schizophrenia (HCC)  2. Behavior concern in adult  3. Disorganized thought process  4. Schizophrenia, paranoid type (HCC) - Full code; Standing - Full code  5. Abnormal blood chemistry  6. Generalized anxiety disorder  7. Schizophrenia, unspecified type (HCC) - Full code; Standing - Diet regular Room service appropriate? Yes; Fluid consistency: Thin; Standing - SARS Coronavirus 2 by RT PCR (hospital order, performed in [facility] hospital lab) *cepheid single result test* Anterior Nasal Swab; Standing -  acetaminophen (TYLENOL) tablet 650 mg - alum & mag hydroxide-simeth (MAALOX/MYLANTA) 200-200-20 MG/5ML suspension 30 mL - magnesium hydroxide (MILK OF MAGNESIA) suspension 30 mL - Place in continuous assessment [facility]; Standing - Involuntary Commitment; Standing - CBC with Differential/Platelet; Standing - Comprehensive metabolic panel; Standing - Ethanol; Standing - TSH; Standing - POCT Urine Drug Screen - (I-Screen); Standing - OLANZapine zydis (ZYPREXA) disintegrating tablet 10 mg - LORazepam (ATIVAN) tablet 1 mg - Full code - Diet regular Room service appropriate? Yes; Fluid consistency: Thin - SARS Coronavirus 2 by RT PCR (hospital order, performed in [facility] hospital lab) *cepheid single result test* Anterior Nasal Swab - Place in continuous assessment [facility] - Involuntary Commitment - CBC with Differential/Platelet - Comprehensive metabolic panel - Ethanol - TSH - POCT Urine Drug Screen - (I-Screen) - OLANZapine zydis (ZYPREXA) disintegrating tablet 5 mg - bacitracin ointment 1 Application - OLANZapine (ZYPREXA) injection 10 mg    Joaquin Courts, NP 07/09/2023 1:08 PM

## 2023-07-10 DIAGNOSIS — F200 Paranoid schizophrenia: Secondary | ICD-10-CM | POA: Diagnosis not present

## 2023-07-10 NOTE — ED Notes (Signed)
Pt denies SI and HI. Does not endorse safety. Will continue to monitor for safety.

## 2023-07-10 NOTE — ED Notes (Signed)
Pt offered food and fluids, refused all offerings and laid back down. Will continue to monitor for safety.

## 2023-07-10 NOTE — Progress Notes (Signed)
LCSW Progress Note  [id number]   [name]  07/10/2023  1:44 PM  Description:   Inpatient Psychiatric Referral  Patient was recommended inpatient per Olin Pia NP There are no available beds at [facility], per [facility]. Patient was referred to the following out of network facilities:   Destination  Service Provider Address Phone Fax  [facility]  [address], [place] [address] [phone]  [facility]  [address] [place] [address] [phone]  [facility] [address]  [place] [address] [phone]  [facility]  [address] [place] [address] [phone]  [facility] [address]  [address] [place] [address] [phone]  [facility]  [address] [place], [place] [address] [phone]  [facility]  [address] [place] [address] [phone]  [facility]  [address], [place] [address] [phone]  [facility]  [address] [place] [address] [phone]  [facility]  [address] [place] [address] [phone]  [facility]  [address] [place] [address] [phone]  [facility]  [address], [place] [address] [phone]  [facility] EFAX  [address], [place] [phone]  [facility]  [address] [place] [address] [phone]  [facility]  [address] [place] [address] [phone]  [facility]  [address] [place] [address] [phone]   [facility]  [address] [place] [address] [phone]  [facility]  [place] [phone]  [facility]  [address] [place], [place] [address] [phone]  [facility] Patient Placement  [facility], [place] [phone]  [facility]  [address] [place], [place] [address] [phone]  [facility]  [place] [phone]  [facility]  [address] [place] [address] [phone]  [facility]  [address] [place], [place] [address] [phone]  [facility]  [address] [place] [address] [phone]  [facility]  [address] [place] [address] [phone]  [facility]  [address] [place] [address] [phone]  [facility]  [address], [place] [address] [phone]  [facility]  [address] [place] [address] [phone]  [facility]  [address] [place] [address] [phone]     Situation ongoing, CSW to continue following and update chart as more information becomes available.     [place] Lousie Calico LCSW-A  07/10/2023 1:44 PM

## 2023-07-10 NOTE — ED Notes (Signed)
Patient resting with eyes closed. Respirations even and unlabored. No acute distress noted. Environment secured. Will continue to monitor for safety.

## 2023-07-10 NOTE — ED Notes (Signed)
Pt toileted, reports urinating and having a bowel movement. Escorted back to the flex unit. Will continue to monitor for safety.

## 2023-07-10 NOTE — ED Notes (Addendum)
This nurse was made aware of patients low pulse the patients pulse was also low on on last night as well.

## 2023-07-10 NOTE — ED Notes (Signed)
Pt sleeping@this time breathing even and unlabored will continue to monitor for safety 

## 2023-07-10 NOTE — ED Notes (Signed)
Pt allowed nurse to take blood pressure, HR, and oxygen saturation. Refused temperature check. Pt refused lab draw a second time. Will continue to monitor for safety.

## 2023-07-10 NOTE — Progress Notes (Signed)
LCSW Progress Note  [id number]   [place] A Yeater  07/10/2023  1:02 AM    Inpatient Behavioral Health Placement  Pt meets inpatient criteria per Joaquin Courts, NP. There are no available beds within [facility] per [facility] Brook McNichol,RN. Referral was sent to the following facilities;   Destination  Service Provider Address Phone Fax  [facility]  [address] [place] [address] [phone]  [facility]  [address] [place] [address] [phone]  [facility] [address]  [place] [address] [phone]  [facility]  [address] [place] [address] [phone]  [facility] [address]  [address] [place] [address] [phone]  [facility]  [address] [place], [place] [address] [phone]  [facility]  [address] [place] [address] [phone]  [facility]  [address] [place] [address] [phone]  [facility]  [address] [place] [address] [phone]  [facility]  [address] [place] [address] [phone]  [facility]  [address] [place] [address] [phone]  [facility]  [address], [place] [address] [phone]  [facility] EFAX  [address] [place], [place] [phone]  [facility]  [address], [place] [address] [phone]  [facility]  [address] [place] [address] [phone]  [facility]  [address] [place] [address] [phone]   [facility]  [address], [place] [address] [phone]  [facility] EFAX  [place] [phone]    Situation ongoing,  CSW will follow up.    Maryjean Ka, MSW, LCSWA 07/10/2023 1:02 AM

## 2023-07-10 NOTE — ED Notes (Signed)
Pt reports having sand all over his body and superglue in his hair. Became increasingly agitated as nurse entered the room. Blood pressure and pulse taken. Pt denies the need to toilet at this time. Pt ate 5 packs of cookies, drank 2 containers of juice and one glass of water. Pt agreed to take oral agitation medication. Will continue to monitor for safety.

## 2023-07-10 NOTE — ED Notes (Signed)
Attempted to perform lab draw on pt, pt became irritated and refused. Will continue to monitor for safety.

## 2023-07-10 NOTE — ED Notes (Signed)
Pt offered food, fluids, and toileting. Declined all offerings. No complaints at present. No signs of distress noted. Will continue to monitor for safety.

## 2023-07-10 NOTE — ED Provider Notes (Signed)
Behavioral Health Progress Note  Date and Time: 07/10/2023 6:24 PM Name: [name] MRN:  [id number]  Subjective:  "I smell fish"  Diagnosis:  Final diagnoses:  Disorganized schizophrenia (HCC)  Behavior concern in adult  Disorganized thought process  Schizophrenia, paranoid type (HCC)  Abnormal blood chemistry  Generalized anxiety disorder  Schizophrenia, unspecified type [facility])  52 year-old male with a hx of  Schizophrenia admitted involuntarily to Observation unit on 07/08/2023 secondary to increased psychotic and aggressive behaviors. Patient threatened to jump into traffic and off a building. Patient was experiencing hallucinations, mumbling, and and was telling people to get ready for his funerals.   Per IVC:  "Respondent has been diagnosed with schizophrenia, respondents has been prescribed psych and pain medicine, respondents threatened to jump into traffic and off a bed in, respondents appeared to be hallucinating respondents is muffled and excessively. Respondents is a danger to self".   Assessment: 52 year-old male sitting in bed eating a snack. He is agitated, angry, with disorganized thinking. He believes that there is fish in his cup of water and keeps sniffing on the cup. He is cursing, spitting, yelling. Patient looks disheveled and malnourished. He is preoccupied and believes that he is going to die. Patient id difficult to redirect.Marland Kitchen He is demanding and arrogant.  Patient appears to be tired and restless. He accepts to take medication to help with his symptoms. Patient is medicated per agitation protocol.  Patient known to the system for his frequent visits. He was receiving [facility] but decided to discontinue treatment about a year ago. He currently has no community services. He reports not having support, that his family is mean.   Provider unable to continue assessment as patient is cursing and threatening, asking staff to go away.  Patient received  medications then fell asleep.  Per nursing at [facility]: "Refusing labs still, allowed to get vitals (except temperature). Had one BM and one count of urine on my shift. He refused lunch, snacks and dinner. But he ate 5 packs of cookies, two of orange juice, and a glass of water in the morning. No irritability or agitation since AM medication given.  They called from [facility] to ask about him. They said they will review the case and get back to social worker".  Total Time spent with patient: 30 minutes  Past Psychiatric History: Schizophrenia, ADHD, Substance abuse,  Past Medical History: Chronic back pain Family History: NA Family Psychiatric  History: NA Social History: Lives alone, unemployed  Additional Social History:    Pain Medications: Denies abuse Prescriptions: Denies abuse Over the Counter: Denies abuse History of alcohol / drug use?: No history of alcohol / drug abuse Longest period of sobriety (when/how long): Pt denies substance use Negative Consequences of Use:  (Pt denies substance use) Withdrawal Symptoms: None                    Sleep: Good  Appetite:  Good  Current Medications:  Current Facility-Administered Medications  Medication Dose Route Frequency Provider Last Rate Last Admin   acetaminophen (TYLENOL) tablet 650 mg  650 mg Oral Q6H PRN Sindy Guadeloupe, NP       alum & mag hydroxide-simeth (MAALOX/MYLANTA) 200-200-20 MG/5ML suspension 30 mL  30 mL Oral Q4H PRN Sindy Guadeloupe, NP       bacitracin ointment 1 Application  1 Application Topical BID Bing Neighbors, NP   1 Application at 07/10/23 0937   magnesium  hydroxide (MILK OF MAGNESIA) suspension 30 mL  30 mL Oral Daily PRN Sindy Guadeloupe, NP       OLANZapine zydis (ZYPREXA) disintegrating tablet 10 mg  10 mg Oral Q8H PRN Sindy Guadeloupe, NP   10 mg at 07/10/23 0810   OLANZapine zydis (ZYPREXA) disintegrating tablet 5 mg  5 mg Oral BID Bing Neighbors, NP       Current Outpatient Medications   Medication Sig Dispense Refill   albuterol (VENTOLIN HFA) 108 (90 Base) MCG/ACT inhaler Inhale 2 puffs into the lungs every 4 (four) hours as needed for wheezing or shortness of breath. (Patient not taking: Reported on 03/09/2023) 18 g 1   chlorproMAZINE (THORAZINE) 100 MG tablet Take 1 tablet (100 mg total) by mouth at bedtime. (Patient not taking: Reported on 03/09/2023) 30 tablet 0   chlorproMAZINE (THORAZINE) 50 MG tablet Take 1 tablet (50 mg total) by mouth in the morning. (Patient not taking: Reported on 03/09/2023) 30 tablet 0    Labs  Lab Results:  Admission on 03/08/2023, Discharged on 03/10/2023  Component Date Value Ref Range Status   Sodium 03/08/2023 139  135 - 145 mmol/L Final   Potassium 03/08/2023 3.4 (L)  3.5 - 5.1 mmol/L Final   Chloride 03/08/2023 104  98 - 111 mmol/L Final   CO2 03/08/2023 22  22 - 32 mmol/L Final   Glucose, Bld 03/08/2023 114 (H)  70 - 99 mg/dL Final   Glucose reference range applies only to samples taken after fasting for at least 8 hours.   BUN 03/08/2023 18  6 - 20 mg/dL Final   Creatinine, Ser 03/08/2023 0.91  0.61 - 1.24 mg/dL Final   Calcium 16/06/9603 9.6  8.9 - 10.3 mg/dL Final   Total Protein 54/05/8118 8.0  6.5 - 8.1 g/dL Final   Albumin 14/78/2956 4.5  3.5 - 5.0 g/dL Final   AST 21/30/8657 59 (H)  15 - 41 U/L Final   ALT 03/08/2023 36  0 - 44 U/L Final   Alkaline Phosphatase 03/08/2023 52  38 - 126 U/L Final   Total Bilirubin 03/08/2023 1.8 (H)  0.3 - 1.2 mg/dL Final   GFR, Estimated 03/08/2023 >60  >60 mL/min Final   Comment: (NOTE) Calculated using the CKD-EPI Creatinine Equation (2021)    Anion gap 03/08/2023 13  5 - 15 Final   Performed at [facility], [address] [place], [place] [address]   Alcohol, Ethyl (B) 03/08/2023 <10  <10 mg/dL Final   Comment: (NOTE) Lowest detectable limit for serum alcohol is 10 mg/dL.  For medical purposes only. Performed at [facility], [address], [place], [place] [address]    Salicylate Lvl 03/08/2023 <7.0 (L)  7.0 - 30.0 mg/dL Final   Performed at [facility], [address], [place], [place] [address]   Acetaminophen (Tylenol), Serum 03/08/2023 <10 (L)  10 - 30 ug/mL Final   Comment: (NOTE) Therapeutic concentrations vary significantly. A range of 10-30 ug/mL  may be an effective concentration for many patients. However, some  are best treated at concentrations outside of this range. Acetaminophen concentrations >150 ug/mL at 4 hours after ingestion  and >50 ug/mL at 12 hours after ingestion are often associated with  toxic reactions.  Performed at [facility], [address], [place], [place] [address]    WBC 03/08/2023 6.5  4.0 - 10.5 K/uL Final   RBC 03/08/2023 5.06  4.22 - 5.81 MIL/uL Final  Hemoglobin 03/08/2023 15.0  13.0 - 17.0 g/dL Final   HCT 96/12/5407 45.8  39.0 - 52.0 % Final   MCV 03/08/2023 90.5  80.0 - 100.0 fL Final   MCH 03/08/2023 29.6  26.0 - 34.0 pg Final   MCHC 03/08/2023 32.8  30.0 - 36.0 g/dL Final   RDW 81/19/1478 13.2  11.5 - 15.5 % Final   Platelets 03/08/2023 238  150 - 400 K/uL Final   nRBC 03/08/2023 0.0  0.0 - 0.2 % Final   Performed at [facility], [address] [place], [place] [address]  Admission on 01/30/2023, Discharged on 02/02/2023  Component Date Value Ref Range Status   Sodium 01/30/2023 139  135 - 145 mmol/L Final   Potassium 01/30/2023 3.3 (L)  3.5 - 5.1 mmol/L Final   Chloride 01/30/2023 101  98 - 111 mmol/L Final   CO2 01/30/2023 25  22 - 32 mmol/L Final   Glucose, Bld 01/30/2023 146 (H)  70 - 99 mg/dL Final   Glucose reference range applies only to samples taken after fasting for at least 8 hours.   BUN 01/30/2023 13  6 - 20 mg/dL Final   Creatinine, Ser 01/30/2023 0.99  0.61 - 1.24 mg/dL Final   Calcium 13/04/6577 11.1 (H)  8.9 - 10.3 mg/dL Final   Total Protein 46/96/2952 8.0  6.5 - 8.1 g/dL Final   Albumin  84/13/2440 4.7  3.5 - 5.0 g/dL Final   AST 07/05/2535 21  15 - 41 U/L Final   ALT 01/30/2023 17  0 - 44 U/L Final   Alkaline Phosphatase 01/30/2023 49  38 - 126 U/L Final   Total Bilirubin 01/30/2023 1.4 (H)  0.3 - 1.2 mg/dL Final   GFR, Estimated 01/30/2023 >60  >60 mL/min Final   Comment: (NOTE) Calculated using the CKD-EPI Creatinine Equation (2021)    Anion gap 01/30/2023 13  5 - 15 Final   Performed at [facility], [address], [place], [place] [address]   Alcohol, Ethyl (B) 01/30/2023 <10  <10 mg/dL Final   Comment: (NOTE) Lowest detectable limit for serum alcohol is 10 mg/dL.  For medical purposes only. Performed at [facility], [address], [place], [place] [address]    Salicylate Lvl 01/30/2023 <7.0 (L)  7.0 - 30.0 mg/dL Final   Performed at [facility], [address], [place], [place] [address]   Acetaminophen (Tylenol), Serum 01/30/2023 <10 (L)  10 - 30 ug/mL Final   Comment: (NOTE) Therapeutic concentrations vary significantly. A range of 10-30 ug/mL  may be an effective concentration for many patients. However, some  are best treated at concentrations outside of this range. Acetaminophen concentrations >150 ug/mL at 4 hours after ingestion  and >50 ug/mL at 12 hours after ingestion are often associated with  toxic reactions.  Performed at [facility], [address] [place], [place] [address]    WBC 01/30/2023 7.2  4.0 - 10.5 K/uL Final   RBC 01/30/2023 5.47  4.22 - 5.81 MIL/uL Final   Hemoglobin 01/30/2023 15.9  13.0 - 17.0 g/dL Final   HCT 32/95/1884 47.1  39.0 - 52.0 % Final   MCV 01/30/2023 86.1  80.0 - 100.0 fL Final   MCH 01/30/2023 29.1  26.0 - 34.0 pg Final   MCHC 01/30/2023 33.8  30.0 - 36.0 g/dL Final   RDW 16/60/6301 12.5  11.5 - 15.5 % Final   Platelets 01/30/2023 223  150 - 400  K/uL Final   nRBC 01/30/2023 0.0  0.0 - 0.2 % Final   Performed at [facility], [address], [place], [place] [address]   Opiates 02/01/2023 NONE DETECTED  NONE DETECTED Final   Cocaine 02/01/2023 NONE DETECTED  NONE DETECTED Final   Benzodiazepines 02/01/2023 POSITIVE (A)  NONE DETECTED Final   Amphetamines 02/01/2023 NONE DETECTED  NONE DETECTED Final   Tetrahydrocannabinol 02/01/2023 NONE DETECTED  NONE DETECTED Final   Barbiturates 02/01/2023 NONE DETECTED  NONE DETECTED Final   Comment: (NOTE) DRUG SCREEN FOR MEDICAL PURPOSES ONLY.  IF CONFIRMATION IS NEEDED FOR ANY PURPOSE, NOTIFY LAB WITHIN 5 DAYS.  LOWEST DETECTABLE LIMITS FOR URINE DRUG SCREEN Drug Class                     Cutoff (ng/mL) Amphetamine and metabolites    1000 Barbiturate and metabolites    200 Benzodiazepine                 200 Opiates and metabolites        300 Cocaine and metabolites        300 THC                            50 Performed at [facility], [address], [place], [place] [address]    TSH 01/30/2023 0.875  0.350 - 4.500 uIU/mL Final   Comment: Performed by a 3rd Generation assay with a functional sensitivity of <=0.01 uIU/mL. Performed at [facility], [address] [place], [place] [address]    SARS Coronavirus 2 by RT PCR 02/01/2023 NEGATIVE  NEGATIVE Final   Comment: (NOTE) SARS-CoV-2 target nucleic acids are NOT DETECTED.  The SARS-CoV-2 RNA is generally detectable in upper and lower respiratory specimens during the acute phase of infection. The lowest concentration of SARS-CoV-2 viral copies this assay can detect is 250 copies / mL. A negative result does not preclude SARS-CoV-2 infection and should not be used as the sole basis for treatment or other patient management decisions.  A negative result may occur with improper specimen collection / handling, submission of specimen other than nasopharyngeal swab, presence of viral mutation(s) within the areas targeted by this assay, and inadequate  number of viral copies (<250 copies / mL). A negative result must be combined with clinical observations, patient history, and epidemiological information.  Fact Sheet for Patients:   RoadLapTop.co.za  Fact Sheet for Healthcare Providers: http://kim-miller.com/  This test is not yet approved or                           cleared by the [place] FDA and has been authorized for detection and/or diagnosis of SARS-CoV-2 by FDA under an Emergency Use Authorization (EUA).  This EUA will remain in effect (meaning this test can be used) for the duration of the COVID-19 declaration under Section 564(b)(1) of the Act, 21 U.S.C. section 360bbb-3(b)(1), unless the authorization is terminated or revoked sooner.  Performed at [facility], [address] [place], [place] [address]     Blood Alcohol level:  Lab Results  Component Value Date   [facility] <10 05/01/2023   ETH <10 03/08/2023    Metabolic Disorder Labs: Lab Results  Component Value Date   HGBA1C 5.5 09/14/2021   MPG 111.15 09/14/2021   MPG 114.02 01/17/2020   Lab Results  Component Value Date   PROLACTIN 20.8 (H) 09/14/2021   PROLACTIN 16.1 (H) 01/17/2020   Lab Results  Component Value Date   CHOL 185 09/14/2021   TRIG 137 09/14/2021   HDL 44 09/14/2021   CHOLHDL 4.2 09/14/2021   VLDL 27 09/14/2021   LDLCALC 114 (H) 09/14/2021   LDLCALC 84 01/17/2020    Therapeutic Lab Levels: No results found for: "LITHIUM" Lab Results  Component Value Date   VALPROATE <10 (L) 12/07/2020   VALPROATE 43 (L) 01/31/2020   Lab Results  Component Value Date   CBMZ 11.7 04/29/2014   CBMZ 16.0 (HH) 04/28/2014    Physical Findings   AIMS    Flowsheet Row [facility]) [facility] Ad[facility] (Discharged) [facility] Admission (Discharged) from  [facility]  AIMS Total Score 0 0 0      AUDIT    Flowsheet Row Ad[facility]) [facility] Admission (Discharged) [facility] Admission (Discharged) [facility] Hosp-Admission (Discharged) [facility]  Alcohol Use Disorder Identification Test Final Score (AUDIT) 0 0 0 10      Flowsheet Row ED from 07/08/2023 in [facility] ED from 03/08/2023 in [facility] from 11/08/2022 in [facility] at [facility]  C-SSRS RISK CATEGORY Moderate Risk No Risk No Risk        Musculoskeletal  Strength & Muscle Tone: decreased Gait & Station: unsteady Patient leans: N/A  Psychiatric Specialty Exam  Presentation  General Appearance:  Casual; Disheveled  Eye Contact: Minimal  Speech: Garbled  Speech Volume: Decreased  Handedness: Right   Mood and Affect  Mood: Anxious; Irritable  Affect: Congruent   Thought Process  Thought Processes: Disorganized  Descriptions of Associations:Loose  Orientation:Partial  Thought Content:Illogical  Diagnosis of Schizophrenia or Schizoaffective disorder in past: Yes  Duration of Psychotic Symptoms: Greater than six months   Hallucinations:Hallucinations: Auditory; Visual  Ideas of Reference:None  Suicidal Thoughts:Suicidal Thoughts: No  Homicidal Thoughts:Homicidal Thoughts: No   Sensorium  Memory: Immediate Fair; Recent Poor; Remote Poor  Judgment: Poor  Insight: Poor   Executive Functions  Concentration: Poor  Attention Span: Poor  Recall: Poor  Fund of Knowledge: Fair  Language: Poor   Psychomotor Activity  Psychomotor  Activity: Psychomotor Activity: Decreased; Restlessness   Assets  Assets: Health and safety inspector; Housing   Sleep  Sleep: Sleep: Poor   Nutritional Assessment (For OBS and FBC admissions only) Has the patient had a weight loss or gain of 10 pounds or more in the last 3 months?: No Has the patient had a decrease in food intake/or appetite?: No Does the patient have dental problems?: No Does the patient have eating habits or behaviors that may be indicators of an eating disorder including binging or inducing vomiting?: No Has the patient recently lost weight without trying?: 1 Has the patient been eating poorly because of a decreased appetite?: 1 Malnutrition Screening Tool Score: 2    Physical Exam  Physical Exam Constitutional:      Appearance: He is ill-appearing.  HENT:     Head: Normocephalic and atraumatic.     Right Ear: Tympanic membrane normal.     Left Ear:  Tympanic membrane normal.     Nose: Nose normal.  Eyes:     Extraocular Movements: Extraocular movements intact.     Pupils: Pupils are equal, round, and reactive to light.  Cardiovascular:     Rate and Rhythm: Normal rate.     Pulses: Normal pulses.  Musculoskeletal:        General: Normal range of motion.     Cervical back: Normal range of motion.  Neurological:     General: No focal deficit present.     Mental Status: He is alert. He is disoriented.    Review of Systems  Constitutional:  Positive for weight loss.  HENT: Negative.    Eyes: Negative.   Respiratory: Negative.    Cardiovascular: Negative.   Gastrointestinal: Negative.   Genitourinary: Negative.   Musculoskeletal: Negative.   Skin: Negative.   Neurological: Negative.   Endo/Heme/Allergies: Negative.   Psychiatric/Behavioral:  Positive for hallucinations and substance abuse. The patient is nervous/anxious.    Blood pressure (!) 91/55, pulse 89, resp. rate 17, SpO2 100%. There is no height or weight on file to calculate  BMI.  Treatment Plan Summary: Daily contact with patient to assess and evaluate symptoms and progress in treatment, Medication management, and Plan referral for inpatient  Olin Pia, NP 07/10/2023 6:24 PM

## 2023-07-11 DIAGNOSIS — F200 Paranoid schizophrenia: Secondary | ICD-10-CM | POA: Diagnosis not present

## 2023-07-11 NOTE — ED Notes (Signed)
Attempted to call [employer] again to request transport. No answer. Still awaiting call back.

## 2023-07-11 NOTE — ED Notes (Signed)
Patient currently resting. Appears to be sleep, rise and fall of the chest noted.

## 2023-07-11 NOTE — ED Notes (Signed)
Sheriff called to transport pt to [facility]

## 2023-07-11 NOTE — ED Provider Notes (Signed)
Behavioral Health Progress Note  Date and Time: 07/11/2023 8:50 AM Name: [name] MRN:  [id number]  Subjective:   Pt chart reviewed and seen on rounds. On approach, NT is attempting to speak with pt about obtaining labs. Pt states we can "draw" his labs on paper and declines for bloodwork to be obtained. He is standing over the garbage can spitting. When asked reason for this, he states there is fluid and hair in his mouth. He states he needs to take a shower because there is "salt and fiber glass" in his hair. He denies suicidal, homicidal ideations. He denies auditory visual hallucinations or paranoia. Pt is currently under IVC, being recommended for inpatient psychiatric admission.   Diagnosis:  Final diagnoses:  Disorganized schizophrenia (HCC)  Behavior concern in adult  Disorganized thought process  Schizophrenia, paranoid type (HCC)  Abnormal blood chemistry  Generalized anxiety disorder  Schizophrenia, unspecified type (HCC)   Total Time spent with patient:  25 minutes  Past Psychiatric History: Schizophrenia, schizo-affective Past Medical History: Asthma, CHF, chronic back pain, COPD, chronic knee pain, migraines Family History: None reported Family Psychiatric  History: None reported Social History: None reported  Pain Medications: Denies abuse Prescriptions: Denies abuse Over the Counter: Denies abuse History of alcohol / drug use?: No history of alcohol / drug abuse Longest period of sobriety (when/how long): Pt denies substance use Negative Consequences of Use:  (Pt denies substance use) Withdrawal Symptoms: None                    Sleep: Poor  Appetite:  Poor  Current Medications:  Current Facility-Administered Medications  Medication Dose Route Frequency Provider Last Rate Last Admin   acetaminophen (TYLENOL) tablet 650 mg  650 mg Oral Q6H PRN Sindy Guadeloupe, NP       alum & mag hydroxide-simeth (MAALOX/MYLANTA) 200-200-20 MG/5ML suspension 30 mL  30  mL Oral Q4H PRN Sindy Guadeloupe, NP       bacitracin ointment 1 Application  1 Application Topical BID Bing Neighbors, NP   1 Application at 07/10/23 0937   magnesium hydroxide (MILK OF MAGNESIA) suspension 30 mL  30 mL Oral Daily PRN Sindy Guadeloupe, NP       OLANZapine zydis (ZYPREXA) disintegrating tablet 10 mg  10 mg Oral Q8H PRN Sindy Guadeloupe, NP   10 mg at 07/10/23 0810   OLANZapine zydis (ZYPREXA) disintegrating tablet 5 mg  5 mg Oral BID Bing Neighbors, NP       Current Outpatient Medications  Medication Sig Dispense Refill   albuterol (VENTOLIN HFA) 108 (90 Base) MCG/ACT inhaler Inhale 2 puffs into the lungs every 4 (four) hours as needed for wheezing or shortness of breath. (Patient not taking: Reported on 03/09/2023) 18 g 1   chlorproMAZINE (THORAZINE) 100 MG tablet Take 1 tablet (100 mg total) by mouth at bedtime. (Patient not taking: Reported on 03/09/2023) 30 tablet 0   chlorproMAZINE (THORAZINE) 50 MG tablet Take 1 tablet (50 mg total) by mouth in the morning. (Patient not taking: Reported on 03/09/2023) 30 tablet 0    Labs  Lab Results:  Admission on 03/08/2023, Discharged on 03/10/2023  Component Date Value Ref Range Status   Sodium 03/08/2023 139  135 - 145 mmol/L Final   Potassium 03/08/2023 3.4 (L)  3.5 - 5.1 mmol/L Final   Chloride 03/08/2023 104  98 - 111 mmol/L Final   CO2 03/08/2023 22  22 - 32 mmol/L Final   Glucose, Bld 03/08/2023  114 (H)  70 - 99 mg/dL Final   Glucose reference range applies only to samples taken after fasting for at least 8 hours.   BUN 03/08/2023 18  6 - 20 mg/dL Final   Creatinine, Ser 03/08/2023 0.91  0.61 - 1.24 mg/dL Final   Calcium 16/06/9603 9.6  8.9 - 10.3 mg/dL Final   Total Protein 54/05/8118 8.0  6.5 - 8.1 g/dL Final   Albumin 14/78/2956 4.5  3.5 - 5.0 g/dL Final   AST 21/30/8657 59 (H)  15 - 41 U/L Final   ALT 03/08/2023 36  0 - 44 U/L Final   Alkaline Phosphatase 03/08/2023 52  38 - 126 U/L Final   Total Bilirubin 03/08/2023 1.8  (H)  0.3 - 1.2 mg/dL Final   GFR, Estimated 03/08/2023 >60  >60 mL/min Final   Comment: (NOTE) Calculated using the CKD-EPI Creatinine Equation (2021)    Anion gap 03/08/2023 13  5 - 15 Final   Performed at [facility], [address], [place], [place] [address]   Alcohol, Ethyl (B) 03/08/2023 <10  <10 mg/dL Final   Comment: (NOTE) Lowest detectable limit for serum alcohol is 10 mg/dL.  For medical purposes only. Performed at [facility], [address] [place], [place] [address]    Salicylate Lvl 03/08/2023 <7.0 (L)  7.0 - 30.0 mg/dL Final   Performed at [facility], [address] [place], [place] [address]   Acetaminophen (Tylenol), Serum 03/08/2023 <10 (L)  10 - 30 ug/mL Final   Comment: (NOTE) Therapeutic concentrations vary significantly. A range of 10-30 ug/mL  may be an effective concentration for many patients. However, some  are best treated at concentrations outside of this range. Acetaminophen concentrations >150 ug/mL at 4 hours after ingestion  and >50 ug/mL at 12 hours after ingestion are often associated with  toxic reactions.  Performed at [facility], [address] [place], [place] [address]    WBC 03/08/2023 6.5  4.0 - 10.5 K/uL Final   RBC 03/08/2023 5.06  4.22 - 5.81 MIL/uL Final   Hemoglobin 03/08/2023 15.0  13.0 - 17.0 g/dL Final   HCT 72/53/6644 45.8  39.0 - 52.0 % Final   MCV 03/08/2023 90.5  80.0 - 100.0 fL Final   MCH 03/08/2023 29.6  26.0 - 34.0 pg Final   MCHC 03/08/2023 32.8  30.0 - 36.0 g/dL Final   RDW 03/47/4259 13.2  11.5 - 15.5 % Final   Platelets 03/08/2023 238  150 - 400 K/uL Final   nRBC 03/08/2023 0.0  0.0 - 0.2 % Final   Performed at [facility], [address] [place], [place] [address]  Admission on 01/30/2023, Discharged on 02/02/2023  Component Date Value Ref Range Status   Sodium 01/30/2023 139  135 - 145 mmol/L Final    Potassium 01/30/2023 3.3 (L)  3.5 - 5.1 mmol/L Final   Chloride 01/30/2023 101  98 - 111 mmol/L Final   CO2 01/30/2023 25  22 - 32 mmol/L Final   Glucose, Bld 01/30/2023 146 (H)  70 - 99 mg/dL Final   Glucose reference range applies only to samples taken after fasting for at least 8 hours.   BUN 01/30/2023 13  6 - 20 mg/dL Final   Creatinine, Ser 01/30/2023 0.99  0.61 - 1.24 mg/dL Final   Calcium 56/43/3295 11.1 (H)  8.9 - 10.3 mg/dL Final   Total Protein 18/84/1660 8.0  6.5 - 8.1 g/dL Final  Albumin 01/30/2023 4.7  3.5 - 5.0 g/dL Final   AST 57/84/6962 21  15 - 41 U/L Final   ALT 01/30/2023 17  0 - 44 U/L Final   Alkaline Phosphatase 01/30/2023 49  38 - 126 U/L Final   Total Bilirubin 01/30/2023 1.4 (H)  0.3 - 1.2 mg/dL Final   GFR, Estimated 01/30/2023 >60  >60 mL/min Final   Comment: (NOTE) Calculated using the CKD-EPI Creatinine Equation (2021)    Anion gap 01/30/2023 13  5 - 15 Final   Performed at [facility], [address], [place], [place] [address]   Alcohol, Ethyl (B) 01/30/2023 <10  <10 mg/dL Final   Comment: (NOTE) Lowest detectable limit for serum alcohol is 10 mg/dL.  For medical purposes only. Performed at [facility], [address] [place], [place] [address]    Salicylate Lvl 01/30/2023 <7.0 (L)  7.0 - 30.0 mg/dL Final   Performed at [facility], [address], [place], [place] [address]   Acetaminophen (Tylenol), Serum 01/30/2023 <10 (L)  10 - 30 ug/mL Final   Comment: (NOTE) Therapeutic concentrations vary significantly. A range of 10-30 ug/mL  may be an effective concentration for many patients. However, some  are best treated at concentrations outside of this range. Acetaminophen concentrations >150 ug/mL at 4 hours after ingestion  and >50 ug/mL at 12 hours after ingestion are often associated with  toxic reactions.  Performed at [facility], [address], [place], [place] [address]    WBC 01/30/2023 7.2  4.0 - 10.5 K/uL Final   RBC 01/30/2023 5.47  4.22 - 5.81 MIL/uL Final   Hemoglobin 01/30/2023 15.9  13.0 - 17.0 g/dL Final   HCT 44/11/4740 47.1  39.0 - 52.0 % Final   MCV 01/30/2023 86.1  80.0 - 100.0 fL Final   MCH 01/30/2023 29.1  26.0 - 34.0 pg Final   MCHC 01/30/2023 33.8  30.0 - 36.0 g/dL Final   RDW 59/56/3875 12.5  11.5 - 15.5 % Final   Platelets 01/30/2023 223  150 - 400 K/uL Final   nRBC 01/30/2023 0.0  0.0 - 0.2 % Final   Performed at [facility], [address], [place], [place] [address]   Opiates 02/01/2023 NONE DETECTED  NONE DETECTED Final   Cocaine 02/01/2023 NONE DETECTED  NONE DETECTED Final   Benzodiazepines 02/01/2023 POSITIVE (A)  NONE DETECTED Final   Amphetamines 02/01/2023 NONE DETECTED  NONE DETECTED Final   Tetrahydrocannabinol 02/01/2023 NONE DETECTED  NONE DETECTED Final   Barbiturates 02/01/2023 NONE DETECTED  NONE DETECTED Final   Comment: (NOTE) DRUG SCREEN FOR MEDICAL PURPOSES ONLY.  IF CONFIRMATION IS NEEDED FOR ANY PURPOSE, NOTIFY LAB WITHIN 5 DAYS.  LOWEST DETECTABLE LIMITS FOR URINE DRUG SCREEN Drug Class                     Cutoff (ng/mL) Amphetamine and metabolites    1000 Barbiturate and metabolites    200 Benzodiazepine                 200 Opiates and metabolites        300 Cocaine and metabolites        300 THC                            50 Performed at [facility], [address] [place], [place] [address]  TSH 01/30/2023 0.875  0.350 - 4.500 uIU/mL Final   Comment: Performed by a 3rd Generation assay with a functional sensitivity of <=0.01 uIU/mL. Performed at [facility], [address] [place], [place] [address]    SARS Coronavirus 2 by RT PCR 02/01/2023 NEGATIVE  NEGATIVE Final   Comment: (NOTE) SARS-CoV-2 target nucleic acids are NOT DETECTED.  The SARS-CoV-2 RNA is generally detectable in upper and  lower respiratory specimens during the acute phase of infection. The lowest concentration of SARS-CoV-2 viral copies this assay can detect is 250 copies / mL. A negative result does not preclude SARS-CoV-2 infection and should not be used as the sole basis for treatment or other patient management decisions.  A negative result may occur with improper specimen collection / handling, submission of specimen other than nasopharyngeal swab, presence of viral mutation(s) within the areas targeted by this assay, and inadequate number of viral copies (<250 copies / mL). A negative result must be combined with clinical observations, patient history, and epidemiological information.  Fact Sheet for Patients:   RoadLapTop.co.za  Fact Sheet for Healthcare Providers: http://kim-miller.com/  This test is not yet approved or                           cleared by the [place] FDA and has been authorized for detection and/or diagnosis of SARS-CoV-2 by FDA under an Emergency Use Authorization (EUA).  This EUA will remain in effect (meaning this test can be used) for the duration of the COVID-19 declaration under Section 564(b)(1) of the Act, 21 U.S.C. section 360bbb-3(b)(1), unless the authorization is terminated or revoked sooner.  Performed at [facility], [address], [place], [place] [address]     Blood Alcohol level:  Lab Results  Component Value Date   [facility] <10 05/01/2023   ETH <10 03/08/2023    Metabolic Disorder Labs: Lab Results  Component Value Date   HGBA1C 5.5 09/14/2021   MPG 111.15 09/14/2021   MPG 114.02 01/17/2020   Lab Results  Component Value Date   PROLACTIN 20.8 (H) 09/14/2021   PROLACTIN 16.1 (H) 01/17/2020   Lab Results  Component Value Date   CHOL 185 09/14/2021   TRIG 137 09/14/2021   HDL 44 09/14/2021   CHOLHDL 4.2 09/14/2021   VLDL 27 09/14/2021   LDLCALC 114 (H) 09/14/2021    LDLCALC 84 01/17/2020    Therapeutic Lab Levels: No results found for: "LITHIUM" Lab Results  Component Value Date   VALPROATE <10 (L) 12/07/2020   VALPROATE 43 (L) 01/31/2020   Lab Results  Component Value Date   CBMZ 11.7 04/29/2014   CBMZ 16.0 (HH) 04/28/2014    Physical Findings   AIMS    Flowsheet Row [facility]) [facility] Ad[facility] (Discharged) [facility] Admission (Discharged) [facility]  AIMS Total Score 0 0 0      AUDIT    Flowsheet Row Ad[facility]) [facility] Admission (Discharged) [facility] Admission (Discharged) [facility] Hosp-Admission (Discharged) from [facility]  Alcohol Use Disorder Identification Test Final Score (AUDIT) 0 0 0 10      Flowsheet Row ED from 07/08/2023 in  [facility] ED from 03/08/2023 in [facility] at [facility] ED from 11/08/2022 in [facility] at [facility]  C-SSRS RISK CATEGORY Moderate Risk No Risk No Risk        Musculoskeletal  Strength & Muscle Tone: within normal limits Gait & Station: normal Patient leans: N/A  Psychiatric Specialty Exam  Presentation  General Appearance:  Disheveled; Casual  Eye Contact: Minimal  Speech: Garbled  Speech Volume: Decreased  Handedness: Right   Mood and Affect  Mood: Irritable  Affect: Congruent   Thought Process  Thought Processes: Disorganized  Descriptions of Associations:Tangential  Orientation:Partial  Thought Content:Tangential  Diagnosis of Schizophrenia or Schizoaffective disorder  in past: Yes  Duration of Psychotic Symptoms: Greater than six months   Hallucinations:Hallucinations: None  Ideas of Reference:None  Suicidal Thoughts:Suicidal Thoughts: No  Homicidal Thoughts:Homicidal Thoughts: No   Sensorium  Memory: Immediate Fair  Judgment: Poor  Insight: Poor   Executive Functions  Concentration: Poor  Attention Span: Poor  Recall: Poor  Fund of Knowledge: Fair  Language: Fair   Psychomotor Activity  Psychomotor Activity: Psychomotor Activity: Restlessness   Assets  Assets: Communication Skills; Financial Resources/Insurance   Sleep  Sleep: Sleep: Poor   Nutritional Assessment (For OBS and FBC admissions only) Has the patient had a weight loss or gain of 10 pounds or more in the last 3 months?: No Has the patient had a decrease in food intake/or appetite?: No Does the patient have dental problems?: No Does the patient have eating habits or behaviors that may be indicators of an eating disorder including binging or inducing vomiting?: No Has the patient recently lost weight without trying?: 1 Has the patient been eating poorly because of a decreased appetite?: 1 Malnutrition Screening Tool Score: 2    Physical Exam  Physical Exam Constitutional:      General: He is not in acute distress.    Appearance: He is not ill-appearing, toxic-appearing or diaphoretic.  Eyes:     General: No scleral icterus. Cardiovascular:     Rate and Rhythm: Normal rate.  Pulmonary:     Effort: Pulmonary effort is normal. No respiratory distress.  Neurological:     Mental Status: He is alert. He is disoriented.  Psychiatric:        Attention and Perception: Perception normal. He is inattentive.        Behavior: Behavior is withdrawn. Behavior is cooperative.        Thought Content: Thought content is delusional.        Judgment: Judgment is inappropriate.    Review of Systems  Constitutional:  Negative for chills and fever.   Respiratory:  Negative for shortness of breath.   Cardiovascular:  Negative for chest pain and palpitations.  Gastrointestinal:  Negative for abdominal pain.  Neurological:  Negative for headaches.   Blood pressure (!) 91/55, pulse 89, resp. rate 17, SpO2 100%. There is no height or weight on file to calculate BMI.  Treatment Plan Summary: Daily contact with patient to assess and evaluate symptoms and progress in treatment, Medication management, and Plan    Continue pt on IVC. Pt is being recommended for inpatient psychiatric admission. Continue current medications.  Lauree Chandler, NP 07/11/2023 8:50 AM

## 2023-07-11 NOTE — ED Notes (Signed)
[facility] for transport. No answer. Left a message for call back.

## 2023-07-11 NOTE — ED Notes (Signed)
Pt sleeping@this time breathing even and unlabored will continue to monitor for safety 

## 2023-07-11 NOTE — Progress Notes (Signed)
Patient has been denied by [facility] due to no appropriate beds available. Patient meets BH inpatient criteria per Darrick Grinder, NP. Patient has been faxed out to the following facilities:   [facility]  [address], [place] [address] [phone]  [facility]  [address] [place] [address] [phone]  [facility] [address]  [place] [address] [phone]  [facility]  [address] [place] [address] [phone]  [facility] [address]  [address] [place] [address] [phone]  [facility]  [address] [place], [place] [address] [phone]  [facility]  [address], [place] [address] [phone]  [facility]  [address], [place] [address] [phone]  [facility]  [address] [place] [address] [phone]  [facility]  [address] [place] [address] [phone]  [facility]  [address] [place] [address] [phone]  [facility]  [address], [place] [address] [phone]  [facility]  [address] [place] [phone]  [facility]  [address], [place] [address] [phone]  [facility]  [address], [place] [address] [phone]  [facility]  [address] [place] [address] [phone]  [facility]  [address], [place] [address] [phone]  [facility]  [place] [phone]  [facility]  [address] [place], [place] [address] [phone]  [facility] Patient Placement  [facility], [place] [phone]  [facility]  [address] [place], [place] [address] [phone]  [facility]  [place] [phone]  [facility]  [address], [place] [address] [phone]  [facility]  [address], [place] [address] [phone]  [facility]  [address], [place] [address] [phone]  [facility]  [address] [place] [address] [phone]  [facility]  [address], [place] [address] [phone]  [facility]  [address] [place] [address] [phone]  [facility]  [address] [place] [address] [phone]  [facility]  [address] [place] [address] [phone]   Damita Dunnings, MSW, LCSW-A  9:25 AM 07/11/2023

## 2023-07-11 NOTE — ED Notes (Signed)
Patient continues to refuse labs and UDS. Encouragement attempt made but patient continues to refuse, stated "Im afraid of needles no, and I dont pee."

## 2023-07-11 NOTE — ED Notes (Signed)
Pt refused medications for tonight he stated he did not need them

## 2023-07-12 DIAGNOSIS — F69 Unspecified disorder of adult personality and behavior: Secondary | ICD-10-CM

## 2023-07-12 DIAGNOSIS — F200 Paranoid schizophrenia: Secondary | ICD-10-CM

## 2023-07-12 DIAGNOSIS — R4189 Other symptoms and signs involving cognitive functions and awareness: Secondary | ICD-10-CM

## 2023-07-12 DIAGNOSIS — F209 Schizophrenia, unspecified: Secondary | ICD-10-CM | POA: Diagnosis not present

## 2023-07-12 DIAGNOSIS — F201 Disorganized schizophrenia: Secondary | ICD-10-CM | POA: Diagnosis not present

## 2023-07-12 DIAGNOSIS — F411 Generalized anxiety disorder: Secondary | ICD-10-CM

## 2023-07-12 MED ORDER — OLANZAPINE 5 MG PO TBDP
5.0000 mg | ORAL_TABLET | Freq: Two times a day (BID) | ORAL | Status: DC
Start: 2023-07-12 — End: 2024-03-30

## 2023-07-12 NOTE — ED Notes (Signed)
Spoke with [facility], RN to give updated report

## 2023-07-12 NOTE — Discharge Instructions (Addendum)
Transfer [facility]

## 2023-07-12 NOTE — ED Notes (Signed)
Pt laying on recliner calm and cooperative no c/o pain or distress will continue to monitor for safety

## 2023-07-12 NOTE — ED Notes (Signed)
Spoke with admissions to inform them that transport has arrived and that patient is leaving our facility headed to them. Paged report line prior without a call back.

## 2023-07-12 NOTE — ED Provider Notes (Signed)
FBC/OBS ASAP Discharge Summary  Date and Time: 07/12/2023 9:09 AM  Name: [name]  MRN:  [id number]   Discharge Diagnoses:  Final diagnoses:  Disorganized schizophrenia (HCC)  Behavior concern in adult  Disorganized thought process  Schizophrenia, paranoid type (HCC)  Generalized anxiety disorder  Schizophrenia, unspecified type (HCC)    Subjective: Patient seen and evaluated face to face by this provider and chart reviewed. On evaluation, patient is sitting upright on the recliner in no acute distress. He is alert and oriented to person, place and time. His thought process is disorganized with scattered and irreverent thought content. His speech is garbled and tangential. His mood is irritable and affect is congruent. At this time, he is not displaying aggressive behaviors. He denies SI/HI/AVH. He c/o a headache but is refusing to take all meds, including scheduled medications. Per MHT, patient had a bowel movement in the trashcan this morning. Patient accepted to [facility] this morning and will transfer under IVC. Patient stable to transfer.   Stay Summary: Per chart review, [name] is 52 year-old male with a hx of  Schizophrenia admitted involuntarily to Observation unit on 07/08/2023 secondary to increased psychotic and aggressive behaviors. Patient threatened to jump into traffic and off a building. Patient was experiencing hallucinations, mumbling, and and was telling people to get ready for his funerals. Per IVC: "Respondent has been diagnosed with schizophrenia, respondents has been prescribed psych and pain medicine, respondents threatened to jump into traffic and off a bed in, respondents appeared to be hallucinating respondents is muffled and excessively. Respondents is a danger to self".   Total Time spent with patient: 20 minutes  Past Psychiatric History: History of schizophrenia/schizoaffective bipolar type, GAD, disorder and substance abuse.   Past Medical History:  history of asthma, CHF, chronic back pain, COPD and chronic knee pain.   Family History:No reported history reported.   Family Psychiatric History: No reported history.  Social History: Patient lives alone. Patient is unemployed.   Tobacco Cessation:  Prescription not provided because: patient transferred to [facility].  Current Medications:  Current Facility-Administered Medications  Medication Dose Route Frequency Provider Last Rate Last Admin   acetaminophen (TYLENOL) tablet 650 mg  650 mg Oral Q6H PRN Sindy Guadeloupe, NP       alum & mag hydroxide-simeth (MAALOX/MYLANTA) 200-200-20 MG/5ML suspension 30 mL  30 mL Oral Q4H PRN Sindy Guadeloupe, NP       bacitracin ointment 1 Application  1 Application Topical BID Bing Neighbors, NP   1 Application at 07/11/23 0926   magnesium hydroxide (MILK OF MAGNESIA) suspension 30 mL  30 mL Oral Daily PRN Sindy Guadeloupe, NP       OLANZapine zydis (ZYPREXA) disintegrating tablet 10 mg  10 mg Oral Q8H PRN Sindy Guadeloupe, NP   10 mg at 07/10/23 0810   OLANZapine zydis (ZYPREXA) disintegrating tablet 5 mg  5 mg Oral BID Bing Neighbors, NP   5 mg at 07/11/23 9563   Current Outpatient Medications  Medication Sig Dispense Refill   OLANZapine zydis (ZYPREXA) 5 MG disintegrating tablet Take 1 tablet (5 mg total) by mouth 2 (two) times daily.      PTA Medications:  PTA Medications  Medication Sig   OLANZapine zydis (ZYPREXA) 5 MG disintegrating tablet Take 1 tablet (5 mg total) by mouth 2 (two) times daily.   Facility Ordered Medications  Medication   acetaminophen (TYLENOL) tablet 650 mg   alum & mag hydroxide-simeth (MAALOX/MYLANTA) 200-200-20 MG/5ML  suspension 30 mL   magnesium hydroxide (MILK OF MAGNESIA) suspension 30 mL   OLANZapine zydis (ZYPREXA) disintegrating tablet 10 mg   And   [COMPLETED] LORazepam (ATIVAN) tablet 1 mg   OLANZapine zydis (ZYPREXA) disintegrating tablet 5 mg   bacitracin ointment 1 Application   [COMPLETED] OLANZapine  (ZYPREXA) injection 10 mg    Flowsheet Row ED from 07/08/2023 in [facility] ED from 03[facility]08/2023 in [facility] Emergency Department at [facility] ED from 11/08/2022 in [facility] Emergency Department at [facility]  C-SSRS RISK CATEGORY Moderate Risk No Risk No Risk       Musculoskeletal  Strength & Muscle Tone: within normal limits Gait & Station: normal Patient leans: Right  Psychiatric Specialty Exam  Presentation  General Appearance:  Disheveled  Eye Contact: Poor  Speech: Garbled; Slow  Speech Volume: Decreased  Handedness: Right   Mood and Affect  Mood: Irritable  Affect: Congruent   Thought Process  Thought Processes: Irrevelant  Descriptions of Associations:Tangential  Orientation:Partial  Thought Content:Tangential; Scattered  Diagnosis of Schizophrenia or Schizoaffective disorder in past: Yes  Duration of Psychotic Symptoms: Greater than six months   Hallucinations:Hallucinations: None  Ideas of Reference:None  Suicidal Thoughts:Suicidal Thoughts: No  Homicidal Thoughts:Homicidal Thoughts: No   Sensorium  Memory: Immediate Fair; Recent Poor; Remote Poor  Judgment: Poor  Insight: Poor   Executive Functions  Concentration: Poor  Attention Span: Poor  Recall: Poor  Fund of Knowledge: Poor  Language: Poor   Psychomotor Activity  Psychomotor Activity: Psychomotor Activity: Restlessness   Assets  Assets: Manufacturing systems engineer; Financial Resources/Insurance; Leisure Time; Physical Health   Sleep  Sleep: Sleep: Poor    Physical Exam  Physical Exam HENT:     Head: Normocephalic.     Nose: Nose normal.  Eyes:     Conjunctiva/sclera: Conjunctivae normal.  Cardiovascular:     Rate and Rhythm: Bradycardia present.  Pulmonary:     Effort: Pulmonary effort is normal.  Musculoskeletal:        General: Normal range of motion.  Neurological:     Mental Status: He  is alert.    Review of Systems  Constitutional: Negative.   HENT: Negative.    Eyes: Negative.   Respiratory: Negative.    Cardiovascular: Negative.   Gastrointestinal: Negative.   Genitourinary: Negative.   Musculoskeletal: Negative.   Neurological:  Positive for headaches.  Endo/Heme/Allergies: Negative.    Blood pressure (!) 99/58, pulse (!) 52, temperature 98 F (36.7 C), resp. rate 18, SpO2 98%. There is no height or weight on file to calculate BMI.  Plan Of Care/Follow-up recommendations:    Disposition: [facility] accepted patient. Can come today. Accepting Dr. Loni Beckwith. IVC continued at the time of transfer.    Yaslene Lindamood L, NP 07/12/2023, 9:09 AM

## 2023-10-19 ENCOUNTER — Ambulatory Visit (HOSPITAL_COMMUNITY)
Admission: EM | Admit: 2023-10-19 | Discharge: 2023-10-19 | Disposition: A | Discharge status: Home / Self Care | Payer: 59 | Attending: Psychiatry | Admitting: Psychiatry

## 2023-10-19 DIAGNOSIS — Z789 Other specified health status: Secondary | ICD-10-CM | POA: Diagnosis not present

## 2023-10-19 DIAGNOSIS — Z5900 Homelessness unspecified: Secondary | ICD-10-CM | POA: Diagnosis not present

## 2023-10-19 DIAGNOSIS — F209 Schizophrenia, unspecified: Secondary | ICD-10-CM | POA: Diagnosis present

## 2023-10-19 DIAGNOSIS — Z748 Other problems related to care provider dependency: Secondary | ICD-10-CM

## 2023-10-19 NOTE — Progress Notes (Signed)
   10/19/23 0655  [facility] Triage Screening (Walk-ins at [facility] only)  How Did You Hear About Us ? Self  What Is the Reason for Your Visit/Call Today? Pt is a 53 yo male who presents to [facility] voluntarily seeking shelter. Pt has hx of schizoaffective disorder. Pt reports that he recently lost his place and he is need a place to stay. Pt denies SI, HI, and AVH. Pt denies etoh and drug use. Pt denies having outpatient services at this time.  How Long Has This Been Causing You Problems? 1 wk - 1 month  Have You Recently Had Any Thoughts About Hurting Yourself? No  Are You Planning to Commit Suicide/Harm Yourself At This time? No  Have you Recently Had Thoughts About Hurting Someone Marigene Shoulder? No  Are You Planning To Harm Someone At This Time? No  Physical Abuse Denies  Verbal Abuse Denies  Sexual Abuse Denies  Exploitation of patient/patient's resources Denies  Self-Neglect Denies  Are you currently experiencing any auditory, visual or other hallucinations? No  Have You Used Any Alcohol or Drugs in the Past 24 Hours? No  Do you have any current medical co-morbidities that require immediate attention? No  Clinician description of patient physical appearance/behavior: Pt was cooperative  What Do You Feel Would Help You the Most Today? Housing Assistance  If access to [facility] was not available, would you have sought care in the Emergency Department? Yes  Determination of Need Routine (7 days)  Options For Referral Medication Management;Outpatient Therapy    Flowsheet Row ED from 10/19/2023 in [facility] ED from 07/08/2023 in [facility] ED from 03/08/2023 in [facility] Emergency Department at [facility]  C-SSRS RISK CATEGORY No Risk Moderate Risk No Risk
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
## 2023-10-19 NOTE — Discharge Instructions (Addendum)
 Based on the information that you have provided and the presenting issues Tailored Care Management resources for have been recommended.  It is imperative that you follow through with treatment recommendations within 5-7 days from the of discharge to mitigate further risk to your safety and mental well-being. A list of referrals has been provided below to get you started.  You are not limited to the list provided.  In case of an urgent crisis, you may contact the Mobile Crisis Unit with [facility] at [phone].    Tailored Care Management  Recipient Services at [phone]  What is Tailored Care Management (TCM)? Tailored Care Management is an important part [employer]. Tailored Care Management provides whole-person care from all health care providers. Whole person care brings together all of a person's needs, including behavioral health, physical health, pharmacy, and unmet health-related resource needs. Tailored Care Management means better health outcomes for our members.  Member Choice in TCM You have a choice in where you receive Tailored Care Management: Care Management Agencies [facility]): provider organizations with experience providing behavioral health, intellectual/developmental disability, and/or traumatic brain injury services to our population. [facility]): primary care practices whose providers have experience providing primary care services to our population. [employer] Tailored Engineer, manufacturing who work at [employer].   Here are some things to think about when you choose how you get Tailored Care Management: The providers you are seeing now. Your specific health care needs. How serious are your physical medical needs are. Where you live.  [employer] will help match you to a Care Manager that has specialized training to meet your needs. You may change your Care Manager twice a year for any reason and at any time with a good  reason. You can choose not to have a Care Manager at any time by calling Member and Recipient Services at [phone] or completing the form in the Member Portal.  Elements of Tailored Care Management [employer] members can stop getting Tailored Care Management at any time. They will still be supported as needed through care coordination and care transitions (see Member Handbook for more information).   [employer] accepts referrals for children who may be at risk of not returning to the community or unable to maintain their community setting. Please submit information on the secure form below.  If you choose to stop Tailored Care Management We will still help those members through care coordination and care transitions who are not in Tailored Care Management. Care coordination will help with finding housing and other unmet health-related resource needs. [employer] will also help with care transitions such as moving from one clinical setting to another.  What is TCM for State-Funded Recipients? Although there will be many things that are similar to Tailored Care Management received by Medicaid members, there are some differences for state-funded recipients. State-funded recipients with the highest level of need will receive Case or Care Management.??  For recipients with behavioral health diagnoses, Case Management is available for children and adults with serious mental health or substance use disorder needs. Hildegard Low has a Financial controller for case management services. For recipients with I/DD and TBI diagnoses, [employer] provides Care Management.??  Recipients can get case management if they cannot receive Medicaid and meet certain criteria.  [employer] has a waitlist for those state-funded recipients who are waiting to receive Care or Case Management. Providers and others can suggest recipients be added to the list; please call [employer] at [phone] if you  feel you need to be placed on this list.?    .Aaron AasShelter List:   [facility]          -(Please Call) Phone: [phone]   Provides housing and specialized case management focused on housing stability.    [place] [facility]) [address] [place], [place] Phone: [phone]   [facility] has been providing emergency shelter to those in need of a permanent residence for over 35 years. The [employer] shelter plays an important role in our community.   There are many life events that can pull someone into a downward spiral towards poverty that is very difficult to get out of. Homelessness is a problem that can affect anyone of us . [facility] is a safe and comforting place to stay, especially if you have experienced the hardship of street life.   [employer] provides a single bed and bedding to 100 adult men and women. The shelter welcomes all who are in need of housing, no one in real need is turned away unless space is not available.   While staying at [facility], guests are offered more than just a bed for a night. Hot meals are provided and every guest has access to case management services. Case managers provide assistance with finding housing, employment, or other services that will help them gain stability. Continuous stay is based on availability, capacity, and progress towards goals.   To contact the front desk of [facility] please call   [phone] ext [phone] or ext. [phone].   [facility] [address] [place], [place] [address] Phone: [phone]   [facility] (Women only) [address] [place], [place] [address] Phone: [phone]   [facility] Address: [address], [place], [place] [address] Hours:   Opens 9?AM Mon Phone: [phone]  The [facility] providers a continuum of housing services to those experiencing homelessness.  They provide transitional [employer] and permanent supportive housing ([place] [employer]) to disabled veterans experiencing homelessness. There is a fast track Rapid Rehousing program couples housing stability services with temporary financial assistance to quickly house individuals and families experiencing homelessness.   [employer] [address], [place] [address] Phone: [phone]   [facility] [address] Nonnie Beagle [place], [place] [address] Phone: [phone]   [facility] [address] [place], [place] [address] Check in at 6:00PM for placement at a local shelter) Phone: [phone]   [facility] Eligibility:  Must be drug and alcohol free for at least 14 days or more at the time of application. This program serves males.  Houses Engineer, civil (consulting), Economist who serve six-month terms.  Houses are financially self-supporting; members split house expenses, which average $90.00 to $130.00 per person per week.  Any Resident who relapses must be immediately expelled. Call:  [phone]   [facility]  Address: [address] Sherryl Domino [place], [place] [address]  Phone#: [phone] [facility] Address: [address] [place], [place] [address] Phone: [phone]  -The [facility] provides food, shelter and other programs and services to the homeless men of [place] through our [employer] program.  By offering safe shelter, three meals a day, clean clothing, Biblical counseling, financial planning, vocational training, GED/education and employment assistance, we've helped mend the shattered lives of many homeless men  since opening in 1974.  We have approximately 267 beds available, with a max of 312 beds including mats for emergency situations and currently house an average of 270 men a night.  Prospective  Client Check-In Information Photo ID Required (State/ Out of State/ [place]) - if photo ID is not available, clients are required to have a printout of a police/sheriff's criminal history report. Help out with chores around the Mission. No sex offender of any type (pending, charged, registered and/or any other sex related offenses) will be permitted to check in. Must be willing to abide by all rules, regulations, and policies established by the [employer]. The following will be provided - shelter, food, clothing, and biblical counseling. If you or someone you know is in need of assistance at our [facility] shelter in [place], [place], please call [phone] ext. [phone].  Women Shelter for [employer] hours are Monday-Friday only.    ..         Medicare Outpatient Therapy and Psychiatry for Recipients   Base on the information you have provided and the presenting issue, outpatient services with therapy and psychiatry have been recommended.  It is imperative that you follow through with treatment recommendations within 5-7 days from the of discharge to mitigate further risk to your safety and mental well-being. A list of referrals has been provided below to get you started.  You are not limited to the list provided.  In case of an urgent crisis, you may contact the Mobile Crisis Unit with [facility] at [phone].   [facility] [address] [place], [place], [address] [phone] phone  [facility] [address] [place], [place], [address] [address] phone ([address] [employer] [address] - [place], [address], [place], [place], [employer], [address], [employer] Healthy [place], [place], [address], [place], [place], [place], Optum, T[employer]are, UHC, [employer], [place])  [facility] [address] [place], [place], [address] [phone] phone  [facility] [address] [place], [place], [address] [phone] phone  [facility] [address] [place], [place], [address] [phone] phone [phone] fax  [facility], [place] [address] [place], [place], [address] [phone] phone  [facility] [address] [place], [place], [address] [phone] phone [phone] fax  [facility] [address] [place], [place], [address] [phone] phone  [facility] [address] [place], [place], [address] [phone] phone  [facility] [address] [place], [place], [address] [phone] phone [phone] fax

## 2023-11-07 ENCOUNTER — Encounter (HOSPITAL_COMMUNITY): Payer: Self-pay | Admitting: Emergency Medicine

## 2023-11-07 ENCOUNTER — Other Ambulatory Visit: Payer: Self-pay

## 2023-11-07 ENCOUNTER — Emergency Department (HOSPITAL_COMMUNITY)
Admission: EM | Admit: 2023-11-07 | Discharge: 2023-11-07 | Disposition: A | Discharge status: Home / Self Care | Attending: Emergency Medicine | Admitting: Emergency Medicine

## 2023-11-07 DIAGNOSIS — J449 Chronic obstructive pulmonary disease, unspecified: Secondary | ICD-10-CM | POA: Insufficient documentation

## 2023-11-07 DIAGNOSIS — G8929 Other chronic pain: Secondary | ICD-10-CM | POA: Insufficient documentation

## 2023-11-07 DIAGNOSIS — I509 Heart failure, unspecified: Secondary | ICD-10-CM | POA: Insufficient documentation

## 2023-11-07 DIAGNOSIS — Z5900 Homelessness unspecified: Secondary | ICD-10-CM | POA: Insufficient documentation

## 2023-11-07 DIAGNOSIS — M546 Pain in thoracic spine: Secondary | ICD-10-CM | POA: Insufficient documentation

## 2023-11-07 DIAGNOSIS — M549 Dorsalgia, unspecified: Secondary | ICD-10-CM | POA: Diagnosis present

## 2023-11-07 MED ORDER — LIDOCAINE 5 % EX PTCH: 1.0000 | MEDICATED_PATCH | CUTANEOUS | 0 refills | Status: AC

## 2023-11-07 MED ORDER — NAPROXEN 500 MG PO TABS
500.0000 mg | ORAL_TABLET | Freq: Once | ORAL | Status: AC
  Administered 2023-11-07: 500 mg via ORAL
  Filled 2023-11-07: qty 1

## 2023-11-07 MED ORDER — NAPROXEN 500 MG PO TABS: 500.0000 mg | ORAL_TABLET | Freq: Two times a day (BID) | ORAL | 0 refills | Status: AC

## 2023-11-07 MED ORDER — LIDOCAINE 5 % EX PTCH
1.0000 | MEDICATED_PATCH | CUTANEOUS | Status: DC
  Administered 2023-11-07: 1 via TRANSDERMAL
  Filled 2023-11-07: qty 1

## 2023-11-07 NOTE — ED Provider Notes (Signed)
 [place] EMERGENCY DEPARTMENT AT [facility] Provider Note   CSN: [id number] Arrival date & time: 11/07/23  0049     History  Chief Complaint  Patient presents with   Back Pain    [name] is a 53 y.o. male history of CHF, COPD, and schizophrenia who presents the ED today for back pain.  Reports back pain for the past 10 years.  He has been taking Tylenol recently without much improvement.  He states he is taken naproxen in the past but is remember if it helped or not.  Currently he has pain at the "sides of his spine" in the thoracic region.  He states that he is homeless and has been sleeping on the ground making his back hurt more than usual. No recent injury or trauma to the back.  No weakness, loss of sensation, changes to bowel habits, or inability to walk. He states that he was not able to stay at the shelter last night which is why he came to the ED for evaluation.  No additional complaints or concerns at this time.  Home Medications Prior to Admission medications   Medication Sig Start Date End Date Taking? Authorizing Provider  lidocaine (LIDODERM) 5 % Place 1 patch onto the skin daily for 7 days. Remove & Discard patch within 12 hours or as directed by MD 11/07/23 11/14/23 Yes Maxwell Marion, PA-C  naproxen (NAPROSYN) 500 MG tablet Take 1 tablet (500 mg total) by mouth 2 (two) times daily. 11/07/23 12/07/23 Yes Maxwell Marion, PA-C  OLANZapine zydis (ZYPREXA) 5 MG disintegrating tablet Take 1 tablet (5 mg total) by mouth 2 (two) times daily. 07/12/23   White, Patrice L, NP      Allergies    Amoxicillin, Dextromethorphan-guaifenesin, Haloperidol, Meloxicam, Nsaids, Quetiapine, Sudafed [pseudoephedrine hcl], Ziprasidone, and Prednisone    Review of Systems   Review of Systems  Musculoskeletal:  Positive for back pain.  All other systems reviewed and are negative.   Physical Exam Updated Vital Signs BP 130/88   Pulse 65   Temp 97.8 F (36.6 C) (Oral)   Resp 18    Ht 5\' 11"  (1.803 m)   Wt 76.2 kg   SpO2 97%   BMI 23.43 kg/m  Physical Exam Vitals and nursing note reviewed.  Constitutional:      General: He is not in acute distress.    Appearance: Normal appearance.  HENT:     Head: Normocephalic and atraumatic.     Mouth/Throat:     Mouth: Mucous membranes are moist.  Eyes:     Conjunctiva/sclera: Conjunctivae normal.     Pupils: Pupils are equal, round, and reactive to light.  Cardiovascular:     Rate and Rhythm: Normal rate and regular rhythm.     Pulses: Normal pulses.     Heart sounds: Normal heart sounds.  Pulmonary:     Effort: Pulmonary effort is normal.     Breath sounds: Normal breath sounds.  Abdominal:     Palpations: Abdomen is soft.     Tenderness: There is no abdominal tenderness.  Musculoskeletal:        General: Tenderness present. No signs of injury. Normal range of motion.     Cervical back: Normal range of motion. No rigidity or tenderness.     Right lower leg: No edema.     Left lower leg: No edema.     Comments: No midline tenderness, step-off, or deformity of cervical, thoracic, or lumbar spine.  Paravertebral tenderness to palpation of the thoracic spine.  Range of motion and strength of upper and lower extremities intact bilaterally.  Skin:    General: Skin is warm and dry.     Findings: No rash.  Neurological:     General: No focal deficit present.     Mental Status: He is alert.     Sensory: No sensory deficit.     Motor: No weakness.  Psychiatric:        Mood and Affect: Mood normal.        Behavior: Behavior normal.    ED Results / Procedures / Treatments   Labs (all labs ordered are listed, but only abnormal results are displayed) Labs Reviewed - No data to display  EKG None  Radiology No results found.  Procedures Procedures: not indicated.   Medications Ordered in ED Medications  lidocaine (LIDODERM) 5 % 1 patch (1 patch Transdermal Patch Applied 11/07/23 0858)  naproxen (NAPROSYN)  tablet 500 mg (500 mg Oral Given 11/07/23 1610)    ED Course/ Medical Decision Making/ A&P                                 Medical Decision Making Risk Prescription drug management.   This patient presents to the ED for concern of chronic back pain, this involves an extensive number of treatment options, and is a complaint that carries with it a high risk of complications and morbidity.   Differential diagnosis includes: cauda equina syndrome, fracture, misalignment, acute exacerbation of chronic back pain, muscle strain/spasm, malingering, etc. Low suspicion for cauda equina - no neurologic deficits Low suspicion for fracture - no midline tenderness, recent trauma or injury to back   Comorbidities  See HPI above   Additional History  Additional history obtained from prior records   Problem List / ED Course / Critical Interventions / Medication Management  Patient reports having chronic back pain for about the past 10 years he has been having pain in his back.  Pain is located at the paravertebral muscles.  He is homeless and reports that he has been sleeping on the ground, which causes his back to hurt.  He has been taking Tylenol for the pain without improvement. He does not have any neurologic deficits, have a low suspicion for cauda equina syndrome.  There is no recent trauma or injury to the back nor midline tenderness, I have a low suspicion for acute fracture. Patient states that he is here because he was not able to get into the shelter last night before last call.  On evaluation, he asked if he could stay a day or 2 in the ED.  I told him that we cannot house patients but we can give him food and bus pass before discharging him.  He is agreeable with plan. I ordered medications including: Naproxen and lidocaine patch for back pain - medications given prior to discharge   Social Determinants of Health  Homelessness   Test / Admission - Considered  Patient is  hemodynamically stable and safe for discharge home. Return precautions provided.       Final Clinical Impression(s) / ED Diagnoses Final diagnoses:  Chronic bilateral back pain, unspecified back location  Homelessness    Rx / DC Orders ED Discharge Orders          Ordered    naproxen (NAPROSYN) 500 MG tablet  2 times daily  11/07/23 0907    lidocaine (LIDODERM) 5 %  Every 24 hours        11/07/23 0907              Maxwell Marion, PA-C 11/07/23 1610    Benjiman Core, MD 11/07/23 [phone]

## 2023-11-07 NOTE — Discharge Instructions (Addendum)
 Take NSAIDs like naproxen for your back pain. You can also use lidocaine patches for additional relief. I have sent prescriptions of both of these to your pharmacy.  Follow up with your primary care provider in the next week for reevaluation.  Get help right away if: You are not able to control when you pee or poop. You have bad back pain and: You feel like you may vomit (nauseous). You vomit. You have pain in your chest or your belly (abdomen). You have shortness of breath. You faint.

## 2023-11-07 NOTE — ED Triage Notes (Addendum)
 Pt presents to the ED via POV with complaints of chronic back pain x 10 years. Last took Tylenol PTA without any relief. Pt also states that he has some scabs on his scalp he'd like to have looked at as well. Of note, the patient is homeless and he missed the last call for the shelter tonight which prompted him coming to the ED for evaluation. A&Ox4 at this time. Denies CP or SOB.

## 2023-11-20 ENCOUNTER — Emergency Department (HOSPITAL_COMMUNITY)
Admission: EM | Admit: 2023-11-20 | Discharge: 2023-11-20 | Disposition: A | Discharge status: Home / Self Care | Attending: Emergency Medicine | Admitting: Emergency Medicine

## 2023-11-20 DIAGNOSIS — F600 Paranoid personality disorder: Secondary | ICD-10-CM | POA: Diagnosis present

## 2023-11-20 DIAGNOSIS — F209 Schizophrenia, unspecified: Secondary | ICD-10-CM

## 2023-11-20 DIAGNOSIS — Z5900 Homelessness unspecified: Secondary | ICD-10-CM

## 2023-11-20 NOTE — ED Notes (Signed)
 Pt refused blood collection with phlebotomy. This RN also attempted to collect pending labs informing pt need to run tests as he's concerns he's being poisoned. To which pt refused - requesting a room and a shower.

## 2023-11-20 NOTE — ED Triage Notes (Signed)
 Pt sts he has been poisoned by an unknown person. No abd pain. No chest pain, no sob. No NVD. Is alert and oriented. Denies ETOH and drug use. Denies SI/HI. Hx schizophrenia.

## 2023-11-20 NOTE — ED Provider Notes (Signed)
 [place] EMERGENCY DEPARTMENT AT [facility] Provider Note   CSN: [id number] Arrival date & time: 11/20/23  1955     History  Chief Complaint  Patient presents with   Delusional    [place] A Talcott is a 53 y.o. male.  53 year old male well-known to me who has history of schizoaffective disorder presents with paranoia.  Patient has history of same.  Denies any SI or HI.  States he feels that for several weeks people have been trying to poison him.  He denies any symptomatology from this.  Denies responding to internal stimulus.  No recent alcohol or drug use.  States he would like to leave at this time.       Home Medications Prior to Admission medications   Medication Sig Start Date End Date Taking? Authorizing Provider  naproxen (NAPROSYN) 500 MG tablet Take 1 tablet (500 mg total) by mouth 2 (two) times daily. 11/07/23 12/07/23  Maxwell Marion, PA-C  OLANZapine zydis (ZYPREXA) 5 MG disintegrating tablet Take 1 tablet (5 mg total) by mouth 2 (two) times daily. 07/12/23   White, Patrice L, NP      Allergies    Amoxicillin, Dextromethorphan-guaifenesin, Haloperidol, Meloxicam, Nsaids, Quetiapine, Sudafed [pseudoephedrine hcl], Ziprasidone, and Prednisone    Review of Systems   Review of Systems  All other systems reviewed and are negative.   Physical Exam Updated Vital Signs BP 122/77 (BP Location: Left Arm)   Pulse 61   Temp 97.7 F (36.5 C) (Oral)   Resp 16   Ht 1.803 m (5\' 11" )   Wt 76.2 kg   SpO2 100%   BMI 23.43 kg/m  Physical Exam Vitals and nursing note reviewed.  Constitutional:      General: He is not in acute distress.    Appearance: Normal appearance. He is well-developed. He is not toxic-appearing.  HENT:     Head: Normocephalic and atraumatic.  Eyes:     General: Lids are normal.     Conjunctiva/sclera: Conjunctivae normal.     Pupils: Pupils are equal, round, and reactive to light.  Neck:     Thyroid: No thyroid mass.     Trachea: No  tracheal deviation.  Cardiovascular:     Rate and Rhythm: Normal rate and regular rhythm.     Heart sounds: Normal heart sounds. No murmur heard.    No gallop.  Pulmonary:     Effort: Pulmonary effort is normal. No respiratory distress.     Breath sounds: Normal breath sounds. No stridor. No decreased breath sounds, wheezing, rhonchi or rales.  Abdominal:     General: There is no distension.     Palpations: Abdomen is soft.     Tenderness: There is no abdominal tenderness. There is no rebound.  Musculoskeletal:        General: No tenderness. Normal range of motion.     Cervical back: Normal range of motion and neck supple.  Skin:    General: Skin is warm and dry.     Findings: No abrasion or rash.  Neurological:     General: No focal deficit present.     Mental Status: He is alert and oriented to person, place, and time. Mental status is at baseline.     GCS: GCS eye subscore is 4. GCS verbal subscore is 5. GCS motor subscore is 6.     Cranial Nerves: No cranial nerve deficit.     Sensory: No sensory deficit.     Motor: Motor function  is intact.  Psychiatric:        Attention and Perception: Attention normal.        Speech: Speech normal.        Behavior: Behavior normal.        Thought Content: Thought content is paranoid. Thought content does not include suicidal ideation. Thought content does not include suicidal plan.     ED Results / Procedures / Treatments   Labs (all labs ordered are listed, but only abnormal results are displayed) Labs Reviewed  COMPREHENSIVE METABOLIC PANEL  ETHANOL  SALICYLATE LEVEL  ACETAMINOPHEN LEVEL  CBC  RAPID URINE DRUG SCREEN, HOSP PERFORMED  CBG MONITORING, ED    EKG None  Radiology No results found.  Procedures Procedures    Medications Ordered in ED Medications - No data to display  ED Course/ Medical Decision Making/ A&P                                 Medical Decision Making Amount and/or Complexity of Data  Reviewed Labs: ordered.   Patient is well-known to me and appears to be at his psychiatric baseline.  Patient does not have any acute psychiatric condition.  Patient is requesting shelter resources and I will provide them.        Final Clinical Impression(s) / ED Diagnoses Final diagnoses:  None    Rx / DC Orders ED Discharge Orders     None         Lorre Nick, MD 11/20/23 2043

## 2023-11-20 NOTE — ED Triage Notes (Addendum)
 EMS reports: pt is concerned people are out to kill him. He thinks he is being poisoned. Was agitated with EMS transport.

## 2023-11-20 NOTE — ED Notes (Signed)
Pt refusing blood draw at this time. MD aware

## 2023-11-20 NOTE — ED Notes (Signed)
 Pt refused EKG.

## 2023-11-20 NOTE — ED Notes (Signed)
 Pt refused blood draw....notified nurse.  KM

## 2023-12-01 ENCOUNTER — Emergency Department (HOSPITAL_COMMUNITY)
Admission: EM | Admit: 2023-12-01 | Discharge: 2023-12-01 | Disposition: A | Discharge status: Home / Self Care | Attending: Emergency Medicine | Admitting: Emergency Medicine

## 2023-12-01 ENCOUNTER — Encounter (HOSPITAL_COMMUNITY): Payer: Self-pay

## 2023-12-01 ENCOUNTER — Other Ambulatory Visit: Payer: Self-pay

## 2023-12-01 DIAGNOSIS — I509 Heart failure, unspecified: Secondary | ICD-10-CM | POA: Diagnosis not present

## 2023-12-01 DIAGNOSIS — Z5900 Homelessness unspecified: Secondary | ICD-10-CM | POA: Insufficient documentation

## 2023-12-01 DIAGNOSIS — J449 Chronic obstructive pulmonary disease, unspecified: Secondary | ICD-10-CM | POA: Diagnosis not present

## 2023-12-01 DIAGNOSIS — Z711 Person with feared health complaint in whom no diagnosis is made: Secondary | ICD-10-CM | POA: Diagnosis present

## 2023-12-01 MED ORDER — ACETAMINOPHEN 325 MG PO TABS
650.0000 mg | ORAL_TABLET | ORAL | Status: DC
  Filled 2023-12-01: qty 2

## 2023-12-01 NOTE — ED Notes (Signed)
 Denies pain, declines tylenol. Currently talking on/ to his phone. Currently eating sandwich / snack.

## 2023-12-01 NOTE — ED Provider Notes (Signed)
 [place] EMERGENCY DEPARTMENT AT [facility] Provider Note   CSN: [id number] Arrival date & time: 12/01/23  1216     History  Chief Complaint  Patient presents with   Homeless    [name] is a 53 y.o. male with history of asthma, CHF, chronic back pain, COPD, chronic knee pain, schizophrenia.  The patient presents to the ED requesting a shower.  He reports he is homeless.  He states that his feet are emanating an odor due to the fact he has not changed his socks in quite some time.  The patient reports that his feet have hurt for the last 2 days.  He denies any trauma to account for this pain.  He denies any redness, swelling at home.  Denies any fevers.  He denies SI, HI, AVH.  He is not responding internal stimuli.  HPI     Home Medications Prior to Admission medications   Medication Sig Start Date End Date Taking? Authorizing Provider  naproxen (NAPROSYN) 500 MG tablet Take 1 tablet (500 mg total) by mouth 2 (two) times daily. 11/07/23 12/07/23  Maxwell Marion, PA-C  OLANZapine zydis (ZYPREXA) 5 MG disintegrating tablet Take 1 tablet (5 mg total) by mouth 2 (two) times daily. 07/12/23   White, Patrice L, NP      Allergies    Amoxicillin, Dextromethorphan-guaifenesin, Haloperidol, Meloxicam, Nsaids, Quetiapine, Sudafed [pseudoephedrine hcl], Ziprasidone, and Prednisone    Review of Systems   Review of Systems  Musculoskeletal:  Positive for myalgias.    Physical Exam Updated Vital Signs BP (!) 144/78   Pulse 88   Temp 98 F (36.7 C)   Resp 18   Ht 5\' 11"  (1.803 m)   Wt 76.2 kg   SpO2 98%   BMI 23.43 kg/m  Physical Exam Vitals and nursing note reviewed.  Constitutional:      General: He is not in acute distress.    Appearance: He is well-developed.  HENT:     Head: Normocephalic and atraumatic.  Eyes:     Conjunctiva/sclera: Conjunctivae normal.  Cardiovascular:     Rate and Rhythm: Normal rate and regular rhythm.     Heart sounds: No murmur  heard. Pulmonary:     Effort: Pulmonary effort is normal. No respiratory distress.     Breath sounds: Normal breath sounds.  Abdominal:     Palpations: Abdomen is soft.     Tenderness: There is no abdominal tenderness.  Musculoskeletal:        General: No swelling.     Cervical back: Neck supple.     Comments: No evidence of swelling, overlying skin change, redness, deformity to patient bilateral feet.  Bilateral 2+ DP pulse present.  Skin:    General: Skin is warm and dry.     Capillary Refill: Capillary refill takes less than 2 seconds.  Neurological:     Mental Status: He is alert.  Psychiatric:        Mood and Affect: Mood normal.     ED Results / Procedures / Treatments   Labs (all labs ordered are listed, but only abnormal results are displayed) Labs Reviewed - No data to display  EKG None  Radiology No results found.  Procedures Procedures   Medications Ordered in ED Medications  acetaminophen (TYLENOL) tablet 650 mg (has no administration in time range)    ED Course/ Medical Decision Making/ A&P   Medical Decision Making  53 year old male presents for evaluation.  Please see HPI  for further details.  On exam, patient bilateral feet have no evidence of swelling, deformity, overlying skin change, erythema.  Patient bilateral feet of 2+ DP pulse.  Brisk refill.  No evidence of infection, cellulitis.  Suspect patient could be malingering.  Patient provided with Tylenol.  I offered imaging of the patient bilateral feet however he deferred on this.  Patient was provided with hygienic products.  Patient requested multiple times to take shower however advised him that we did not have a shower facility here at this hospital.  Advised patient that he will need to follow-up with shelters in the area.  I have provided the patient with resource guide for shelters.  I provided him with a bag lunch as well as hygienic products.  Provided him with change of socks.  At this  time, the patient stable to discharge home.  Will send him home with resource guide for shelters.   Final Clinical Impression(s) / ED Diagnoses Final diagnoses:  Homeless    Rx / DC Orders ED Discharge Orders     None         Clent Ridges 12/01/23 1332    Lorre Nick, MD 12/02/23 1020

## 2023-12-01 NOTE — Discharge Instructions (Addendum)
 Please follow-up with homeless shelter.  Please continue taking ibuprofen.  Return to ED with any new symptoms.

## 2023-12-01 NOTE — ED Notes (Signed)
 Security present to assist with d/c

## 2023-12-01 NOTE — ED Notes (Signed)
 Alert, NAD, calm, interactive, steady gait. Listening to music on his cell phone.

## 2023-12-01 NOTE — ED Notes (Signed)
 Delay in d/c d/t influx of other critical pts in department

## 2023-12-01 NOTE — ED Triage Notes (Addendum)
 Pt here stating that he needs an shower  Pt also states that his feet hurt and he can not find a house to go to. Pt denies any symptoms. Pt is alert and oriented. Explained to pt that there is not a place for him to shower. Pt states that he wants to be seen for foot pain. Pt alert and oriented. Does not appear paranoid at this time. Denies SI/HI

## 2024-03-29 ENCOUNTER — Emergency Department (HOSPITAL_COMMUNITY): Payer: Self-pay

## 2024-03-29 ENCOUNTER — Emergency Department (HOSPITAL_COMMUNITY)
Admission: EM | Admit: 2024-03-29 | Discharge: 2024-03-31 | Disposition: A | Discharge status: Other Acute Inpatient Hospital | Attending: Emergency Medicine | Admitting: Emergency Medicine

## 2024-03-29 ENCOUNTER — Emergency Department (HOSPITAL_COMMUNITY)
Admission: EM | Admit: 2024-03-29 | Discharge: 2024-03-29 | Disposition: A | Discharge status: Home / Self Care | Payer: Self-pay | Source: Home / Self Care | Attending: Emergency Medicine | Admitting: Emergency Medicine

## 2024-03-29 ENCOUNTER — Other Ambulatory Visit: Payer: Self-pay

## 2024-03-29 ENCOUNTER — Emergency Department (HOSPITAL_COMMUNITY)

## 2024-03-29 ENCOUNTER — Encounter (HOSPITAL_COMMUNITY): Payer: Self-pay

## 2024-03-29 DIAGNOSIS — R7401 Elevation of levels of liver transaminase levels: Secondary | ICD-10-CM | POA: Diagnosis not present

## 2024-03-29 DIAGNOSIS — Z5900 Homelessness unspecified: Secondary | ICD-10-CM | POA: Insufficient documentation

## 2024-03-29 DIAGNOSIS — F200 Paranoid schizophrenia: Secondary | ICD-10-CM | POA: Diagnosis present

## 2024-03-29 DIAGNOSIS — I509 Heart failure, unspecified: Secondary | ICD-10-CM | POA: Diagnosis not present

## 2024-03-29 DIAGNOSIS — R944 Abnormal results of kidney function studies: Secondary | ICD-10-CM | POA: Insufficient documentation

## 2024-03-29 DIAGNOSIS — J4489 Other specified chronic obstructive pulmonary disease: Secondary | ICD-10-CM | POA: Insufficient documentation

## 2024-03-29 DIAGNOSIS — R45851 Suicidal ideations: Secondary | ICD-10-CM | POA: Insufficient documentation

## 2024-03-29 DIAGNOSIS — E8809 Other disorders of plasma-protein metabolism, not elsewhere classified: Secondary | ICD-10-CM | POA: Diagnosis not present

## 2024-03-29 DIAGNOSIS — D649 Anemia, unspecified: Secondary | ICD-10-CM | POA: Diagnosis not present

## 2024-03-29 DIAGNOSIS — E860 Dehydration: Secondary | ICD-10-CM

## 2024-03-29 DIAGNOSIS — F29 Unspecified psychosis not due to a substance or known physiological condition: Secondary | ICD-10-CM

## 2024-03-29 DIAGNOSIS — R55 Syncope and collapse: Secondary | ICD-10-CM | POA: Diagnosis present

## 2024-03-29 LAB — COMPREHENSIVE METABOLIC PANEL WITH GFR
ALT: 25 U/L (ref 0–44)
ALT: 29 U/L (ref 0–44)
AST: 39 U/L (ref 15–41)
AST: 50 U/L — ABNORMAL HIGH (ref 15–41)
Albumin: 3.5 g/dL (ref 3.5–5.0)
Albumin: 3.4 g/dL — ABNORMAL LOW (ref 3.5–5.0)
Alkaline Phosphatase: 42 U/L (ref 38–126)
Alkaline Phosphatase: 46 U/L (ref 38–126)
Anion gap: 12 (ref 5–15)
Anion gap: 8 (ref 5–15)
BUN: 22 mg/dL — ABNORMAL HIGH (ref 6–20)
BUN: 23 mg/dL — ABNORMAL HIGH (ref 6–20)
CO2: 23 mmol/L (ref 22–32)
CO2: 26 mmol/L (ref 22–32)
Calcium: 8.8 mg/dL — ABNORMAL LOW (ref 8.9–10.3)
Calcium: 8.9 mg/dL (ref 8.9–10.3)
Chloride: 107 mmol/L (ref 98–111)
Chloride: 110 mmol/L (ref 98–111)
Creatinine, Ser: 1.16 mg/dL (ref 0.61–1.24)
Creatinine, Ser: 1.1 mg/dL (ref 0.61–1.24)
GFR, Estimated: 42 mL/min — ABNORMAL LOW (ref >60)
GFR, Estimated: >60 mL/min (ref >60)
Glucose, Bld: 80 mg/dL (ref 70–99)
Glucose, Bld: 111 mg/dL — ABNORMAL HIGH (ref 70–99)
Potassium: 3.7 mmol/L (ref 3.5–5.1)
Potassium: 3.6 mmol/L (ref 3.5–5.1)
Sodium: 142 mmol/L (ref 135–145)
Sodium: 144 mmol/L (ref 135–145)
Total Bilirubin: 1 mg/dL (ref 0.0–1.2)
Total Bilirubin: 0.7 mg/dL (ref 0.0–1.2)
Total Protein: 5.7 g/dL — ABNORMAL LOW (ref 6.5–8.1)
Total Protein: 5.8 g/dL — ABNORMAL LOW (ref 6.5–8.1)

## 2024-03-29 LAB — I-STAT CHEM 8, ED
BUN: 25 mg/dL — ABNORMAL HIGH (ref 6–20)
Calcium, Ion: 1.23 mmol/L (ref 1.15–1.40)
Chloride: 107 mmol/L (ref 98–111)
Creatinine, Ser: 1.2 mg/dL (ref 0.61–1.24)
Glucose, Bld: 79 mg/dL (ref 70–99)
HCT: 33 % — ABNORMAL LOW (ref 39.0–52.0)
Hemoglobin: 11.2 g/dL — ABNORMAL LOW (ref 13.0–17.0)
Potassium: 3.8 mmol/L (ref 3.5–5.1)
Sodium: 145 mmol/L (ref 135–145)
TCO2: 28 mmol/L (ref 22–32)

## 2024-03-29 LAB — ACETAMINOPHEN LEVEL
Acetaminophen (Tylenol), Serum: <10 ug/mL — ABNORMAL LOW (ref 10–30)
Acetaminophen (Tylenol), Serum: <10 ug/mL — ABNORMAL LOW (ref 10–30)

## 2024-03-29 LAB — I-STAT CG4 LACTIC ACID, ED: Lactic Acid, Venous: 0.6 mmol/L (ref 0.5–1.9)

## 2024-03-29 LAB — ETHANOL
Alcohol, Ethyl (B): <15 mg/dL (ref <15)
Alcohol, Ethyl (B): <15 mg/dL (ref <15)

## 2024-03-29 LAB — CBC
HCT: 34.8 % — ABNORMAL LOW (ref 39.0–52.0)
HCT: 37.7 % — ABNORMAL LOW (ref 39.0–52.0)
Hemoglobin: 11.5 g/dL — ABNORMAL LOW (ref 13.0–17.0)
Hemoglobin: 12.3 g/dL — ABNORMAL LOW (ref 13.0–17.0)
MCH: 30 pg (ref 26.0–34.0)
MCH: 29.8 pg (ref 26.0–34.0)
MCHC: 33 g/dL (ref 30.0–36.0)
MCHC: 32.6 g/dL (ref 30.0–36.0)
MCV: 90.9 fL (ref 80.0–100.0)
MCV: 91.3 fL (ref 80.0–100.0)
Platelets: 166 K/uL (ref 150–400)
Platelets: 173 K/uL (ref 150–400)
RBC: 3.83 MIL/uL — ABNORMAL LOW (ref 4.22–5.81)
RBC: 4.13 MIL/uL — ABNORMAL LOW (ref 4.22–5.81)
RDW: 13.5 % (ref 11.5–15.5)
RDW: 13.6 % (ref 11.5–15.5)
WBC: 4.7 K/uL (ref 4.0–10.5)
WBC: 6.2 K/uL (ref 4.0–10.5)
nRBC: 0 % (ref 0.0–0.2)
nRBC: 0 % (ref 0.0–0.2)

## 2024-03-29 LAB — SALICYLATE LEVEL
Salicylate Lvl: <7 mg/dL — ABNORMAL LOW (ref 7.0–30.0)
Salicylate Lvl: <7 mg/dL — ABNORMAL LOW (ref 7.0–30.0)

## 2024-03-29 LAB — T4, FREE: Free T4: 1.03 ng/dL (ref 0.61–1.12)

## 2024-03-29 LAB — AMMONIA: Ammonia: 13 umol/L (ref 9–35)

## 2024-03-29 LAB — TSH: TSH: 0.994 u[IU]/mL (ref 0.350–4.500)

## 2024-03-29 MED ORDER — SODIUM CHLORIDE 0.9 % IV BOLUS
1000.0000 mL | Freq: Once | INTRAVENOUS | Status: AC
  Administered 2024-03-29: 1000 mL via INTRAVENOUS

## 2024-03-29 NOTE — ED Triage Notes (Addendum)
 Patient brought in by EMS. He was found in the middle of the street. Patient mostly mumbles when you ask him a question. Patient states he doesn't know what happened and  I gave up. Denies pain.   Per [employer] he was released from jail yesterday, homeless. [employer] states his son works for [employer], but does not have contact with the patient. [employer] officer says family was attempting to IVC him yesterday. Patient denies SI/HI.

## 2024-03-29 NOTE — ED Provider Notes (Signed)
  [place] EMERGENCY DEPARTMENT AT [facility] Provider Note   CSN: [id number] Arrival date & time: 03/29/24  2135     Patient presents with: Fall   [name] is a 53 y.o. male.  {Add pertinent medical, surgical, social history, OB history to YEP:67052}  Fall       Prior to Admission medications   Medication Sig Start Date End Date Taking? Authorizing Provider  OLANZapine  zydis (ZYPREXA ) 5 MG disintegrating tablet Take 1 tablet (5 mg total) by mouth 2 (two) times daily. 07/12/23   White, Patrice L, NP    Allergies: Amoxicillin , Dextromethorphan -guaifenesin , Haloperidol , Meloxicam, Nsaids, Quetiapine , Sudafed [pseudoephedrine hcl], Ziprasidone , and Prednisone    Review of Systems  Updated Vital Signs BP 105/76 (BP Location: Left Arm)   Pulse (!) 56   Temp 98.7 F (37.1 C)   Resp 18   SpO2 100%   Physical Exam  (all labs ordered are listed, but only abnormal results are displayed) Labs Reviewed  CBC - Abnormal; Notable for the following components:      Result Value   RBC 4.13 (*)    Hemoglobin 12.3 (*)    HCT 37.7 (*)    All other components within normal limits  ETHANOL  RAPID URINE DRUG SCREEN, HOSP PERFORMED  ACETAMINOPHEN  LEVEL  SALICYLATE LEVEL  COMPREHENSIVE METABOLIC PANEL WITH GFR    EKG: None  Radiology: No results found.  {Document cardiac monitor, telemetry assessment procedure when appropriate:32947} Procedures   Medications Ordered in the ED - No data to display    {Click here for ABCD2, HEART and other calculators REFRESH Note before signing:1}                              Medical Decision Making Amount and/or Complexity of Data Reviewed Labs: ordered. Radiology: ordered.   ***  {Document critical care time when appropriate  Document review of labs and clinical decision tools ie CHADS2VASC2, etc  Document your independent review of radiology images and any outside records  Document your discussion with family  members, caretakers and with consultants  Document social determinants of health affecting pt's care  Document your decision making why or why not admission, treatments were needed:32947:::1}   Final diagnoses:  None    ED Discharge Orders     None

## 2024-03-29 NOTE — ED Triage Notes (Signed)
 Pt bibems. [place] laid on [place] curb x 1 hour per homeless population. Pt is mute. Pupils were constricted, Narcan  given by EMS with no changes. No acute distress. Pt looks thin. 1L NS given. 18G LAC.

## 2024-03-29 NOTE — Discharge Instructions (Addendum)
 Please drink plenty of fluids including electrolyte containing fluids, such as pedialyte, gatorade to help with dehydration.

## 2024-03-29 NOTE — ED Notes (Signed)
 Pt discharged by provider, he took his own IV out of his arm and refused having his arm bandaged.

## 2024-03-29 NOTE — ED Notes (Addendum)
 Pt continues to tell me he is not able to urinate yet. Pt was trying to get up out of the bed. Pt says that his name is not Italy Legore and that he does not belong here. Pt was advised that he was found down on the curb and was brought to the hospital to get check out, he agreed to stay. Provider notified.

## 2024-03-29 NOTE — ED Provider Notes (Signed)
 [place] EMERGENCY DEPARTMENT AT [facility] Provider Note   CSN: [id number] Arrival date & time: 03/29/24  1419     Patient presents with: No chief complaint on file.   Lenice Charon Doe is a 53 y.o. male with unknown past medical history who presents after being found unresponsive for around 1 hour on the street.  Local homeless person, but not well-known to police.  Pupils were somewhat small on EMS arrival, Narcan  given with no changes.  He was mute but does seem to be responding to stimuli, was able to take deep breaths when prompted.  Eyes follow and track provider movement.  1 L normal saline given prior to arrival.  After second fluid bolus I was able to identify the patient, endorses name [place] Wafer. History of schizophrenia, homelessness.    HPI     Prior to Admission medications   Not on File    Allergies: Patient has no allergy information on record.    Review of Systems  All other systems reviewed and are negative.   Updated Vital Signs BP 98/70   Pulse (!) 55   Temp 97.8 F (36.6 C) (Oral)   Resp 13   SpO2 98%   Physical Exam Vitals and nursing note reviewed.  Constitutional:      General: He is not in acute distress.    Appearance: Normal appearance.     Comments: cachectic  HENT:     Head: Normocephalic and atraumatic.  Eyes:     General:        Right eye: No discharge.        Left eye: No discharge.  Cardiovascular:     Rate and Rhythm: Normal rate and regular rhythm.     Heart sounds: No murmur heard.    No friction rub. No gallop.  Pulmonary:     Effort: Pulmonary effort is normal.     Breath sounds: Normal breath sounds.  Abdominal:     General: Bowel sounds are normal.     Palpations: Abdomen is soft.     Comments: No significant tenderness, rigidity throughout palpation of the abdomen.  Skin:    General: Skin is warm and dry.     Capillary Refill: Capillary refill takes less than 2 seconds.     Comments: Scattered red  excoriations on exposed skin surfaces  Neurological:     Mental Status: He is alert.     Comments: Nonverbal, but eyes follow, tracks movement, seems to be attempting to speak  Psychiatric:        Mood and Affect: Mood normal.        Behavior: Behavior normal.     (all labs ordered are listed, but only abnormal results are displayed) Labs Reviewed - No data to display  EKG: None  Radiology: No results found.   Procedures   Medications Ordered in the ED - No data to display  Clinical Course as of 03/29/24 1613  Tue Mar 29, 2024  1500 John Doe presenting by EMS found down, but seen walking prior by bystanders.  Lying on the street, awake but catatonic. Nonverbal for EMS.  No ID.  Patient will not talk to me on exam, eyes staring ahead, he will whisper but I can't understand him.  He has chillbains on his toes. Appears dissheveled, possibly homeless.  EMS gave IV fluids and narcan  but no change in mental status.  Pending labs, CT imaging. Pt appears cachetic on exam. [MT]  Clinical Course User Index [MT] Trifan, Donnice PARAS, MD                                 Medical Decision Making  This patient is a 53 y.o. male  who presents to the ED for concern of ams, syncope, collapse.   Differential diagnoses prior to evaluation: The emergent differential diagnosis includes, but is not limited to,  CVA, seizure, hypotension, sepsis, hypoglycemia, hypoxic encephalopathy, metabolic encephalopathy, polypharmacy, substance abuse, developing dementia or alzheimers, meningitis, encephalitis, hypertensive emergency, other systemic infection, acute alcohol intoxication, acute alcohol or other drug withdrawal or psychiatric manifestation vs other causes of syncope, ACS, arrhythmia, vasovagal / orthostatic hypotension, sepsis, hypoglycemia, electrolyte disturbance, respiratory failure, anemia, dehydration, heat injury, polypharmacy, malignancy, anxiety/panic attack . This is not an exhaustive  differential.   Past Medical History / Co-morbidities / Social History: Homelessness, schizophrenia  Physical Exam: Physical exam performed. The pertinent findings include: Patient is thin, borderline cachectic, he was initially nonverbal, but throughout evaluation after receiving fluids he became responsive, was able to tell me his name, he is able to ambulate without difficulty.  Lab Tests/Imaging studies: I personally interpreted labs/imaging and the pertinent results include: CMP with mildly elevated BUN at 22, borderline elevated creatinine at 1.16, otherwise overall unremarkable.  CBC with mild anemia, hemoglobin 11.5, normal TSH, normal T4, normal lactic acid, ammonia.  No salicylate, acetaminophen , or ethanol noted.  He was unable to provide a urine sample in the emergency department.  He is requesting discharge and reports that he feels better.  Although I do think that completing his workup is important given his altered status on arrival.  CT head without contrast complaint, chest x-ray without any significant abnormality I agree with the radiologist interpretation.  Cardiac monitoring: EKG obtained and interpreted by myself and attending physician which shows: NSR borderline right axis deviation   Medications: I ordered medication including fluid bolus for dehydration.  I have reviewed the patients home medicines and have made adjustments as needed.   Disposition: After consideration of the diagnostic results and the patients response to treatment, I feel that as patient is able to ambulate, appears mentally improved from his arrival after fluids and is requesting discharge I think reasonable to let him go at this time, because otherwise I think that he would need to be sedated for any additional workup, he remains well appearing, and is requesting discharge at this time  emergency department workup does not suggest an emergent condition requiring admission or immediate intervention  beyond what has been performed at this time. The plan is: as above. The patient is safe for discharge and has been instructed to return immediately for worsening symptoms, change in symptoms or any other concerns.   Final diagnoses:  None    ED Discharge Orders     None          Rosan Sherlean VEAR DEVONNA 03/29/24 1830    Cottie Donnice PARAS, MD 03/30/24 1420

## 2024-03-29 NOTE — ED Notes (Signed)
 Pt refused vitals

## 2024-03-30 ENCOUNTER — Emergency Department (HOSPITAL_COMMUNITY)

## 2024-03-30 DIAGNOSIS — F200 Paranoid schizophrenia: Secondary | ICD-10-CM

## 2024-03-30 DIAGNOSIS — R45851 Suicidal ideations: Secondary | ICD-10-CM | POA: Diagnosis not present

## 2024-03-30 LAB — TSH: TSH: 1.071 u[IU]/mL (ref 0.350–4.500)

## 2024-03-30 MED ORDER — CHLORPROMAZINE HCL 25 MG PO TABS
50.0000 mg | ORAL_TABLET | Freq: Four times a day (QID) | ORAL | Status: DC | PRN
  Filled 2024-03-30: qty 2

## 2024-03-30 MED ORDER — CHLORPROMAZINE HCL 25 MG PO TABS
50.0000 mg | ORAL_TABLET | Freq: Three times a day (TID) | ORAL | Status: DC
  Administered 2024-03-30: 50 mg via ORAL
  Filled 2024-03-30 (×3): qty 2

## 2024-03-30 NOTE — ED Notes (Signed)
 First Exam has been efiled envelope [id number]

## 2024-03-30 NOTE — ED Notes (Signed)
 Pt belongings placed in Visitor locker #2. Security called to wand pt.
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
## 2024-03-30 NOTE — Progress Notes (Signed)
 LCSW Progress Note  [id number]   [name]  03/30/2024  1:28 PM  Description:   Inpatient Psychiatric Referral  Patient was recommended inpatient per [name] NP). There are no available beds at [facility], per [facility] Cherylynn Ernst RN.Patient was referred to the following out of network facilities:   Destination  Service Provider Address Phone Fax  [facility]  [address] [place] [address] [phone]  [facility] [address]  [place] [address] [phone]  [facility]  [address] [place] [address] [phone]  [facility]  [address] [place] [address] [phone]  [facility]  [address] [place] [address] [phone]  [facility]  [address], [place] [address] [phone]  [facility]  [address] [place] [address] [phone]  [facility]  [address] [place], [place] [phone]  [facility]  [address] [place] [address] [phone]  [facility]  [address] [place] [address] [phone]      Situation ongoing, CSW to continue following and update chart as more information becomes available.     [name], MSW, LCSW  03/30/2024 1:28 PM

## 2024-03-30 NOTE — ED Notes (Signed)
 IVC PAPERWORK TAKEN TO BLUE ZONE AND PLACED ON CLIPBOARD

## 2024-03-30 NOTE — Progress Notes (Signed)
 Pt has been accepted to [facility] TODAY  03/30/2024  Bed assignment: Main campus  Pt meets inpatient criteria per: Elveria Batter   Attending Physician will be Millie Manners, MD  Report can be called to: [phone] (this is a pager, please leave call-back number when giving report)  Pt can arrive ASAP  Care Team Notified:  Elveria Batter NP, Harlene Branch RN  [place] Marquiz Sotelo LCSW-A   03/30/2024 2:13 PM

## 2024-03-30 NOTE — ED Notes (Signed)
 Pt given sandwich bag with apple juice. Pt states I don't want it. This NT left food and drink in case pt changes his mind.

## 2024-03-30 NOTE — ED Notes (Addendum)
 Pt informed urine sample is needed. Pt states I won't use the bathroom here. This NT asked why pt stated Because I don't want to.
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
## 2024-03-30 NOTE — ED Notes (Signed)
 [facility] updated on patient status.

## 2024-03-30 NOTE — ED Notes (Signed)
 IVC CASE # RETRIEVED FROM COURT OF CLERK AND THEY WERE ABLE TO RELEASE CASE NUMBER BECAUSE IT WAS DONE IN THE FIELD. BUT THE NEED THE 1ST EXAM CEIRA WILL SUBMIT IT

## 2024-03-30 NOTE — ED Notes (Signed)
 IVC: ENVELOPE #: [id number] CASE #: 3 COPIES PLACED ON PURPLE FOLDER IN ORANGE COPY IN MEDICAL RECORDS DRAWER

## 2024-03-30 NOTE — ED Notes (Addendum)
 Report given to [facility] Jeralyn Kurtz, [place]

## 2024-03-31 MED ORDER — LORAZEPAM 2 MG/ML IJ SOLN
2.0000 mg | Freq: Once | INTRAMUSCULAR | Status: AC
  Administered 2024-03-31: 2 mg via INTRAMUSCULAR
  Filled 2024-03-31: qty 1

## 2024-03-31 NOTE — ED Notes (Signed)
 Patient became agitated trying to leave unit. Redirection unsuccessful. Patient then urinated on the floor, then refused to change clothes. MD made aware and new orders given.

## 2024-03-31 NOTE — ED Provider Notes (Signed)
 I was notified that the patient's transport had arrived.  He continues to be medically stable for transport.   Emil Share, DO 03/31/24 [phone]
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
## 2024-03-31 NOTE — ED Notes (Addendum)
 Pt pulse and HR are very low (HR:37, pulse:37) but BP, RR, SPO2, are with normal ranges and RR is 11. The nurse is aware about the situation and Pt is currently sleeping.

## 2024-03-31 NOTE — ED Provider Notes (Signed)
 Emergency Medicine Observation Re-evaluation Note  [name] is a 53 y.o. male, seen on rounds today.  Pt initially presented to the ED for complaints of Fall and Psychiatric Evaluation Currently, the patient is resting.  Physical Exam  BP 105/75   Pulse (!) 38   Temp 97.7 F (36.5 C) (Axillary)   Resp 15   SpO2 100%  Physical Exam General: nad   ED Course / MDM  EKG:   I have reviewed the labs performed to date as well as medications administered while in observation.  Recent changes in the last 24 hours include intermittent agitation, low resting HR when asleep. Plan for [facility]  Current plan is for send to [place].    Elnor Jayson LABOR, DO 03/31/24 [phone]

## 2024-03-31 NOTE — ED Notes (Signed)
 Patient resting in bed with eyes closed. Sitter in view of patient.

## 2024-03-31 NOTE — ED Notes (Signed)
 [id number]  The patient was IVC'd on 03/29/2024 and it is set to expire on 04/05/2024.   3 copies of the IVC documents are located at the nurse's station on the purple clipboard in the [facility] Zone.

## 2024-03-31 NOTE — ED Notes (Signed)
 This RN [facility], left a call back number on the pager system.

## 2024-06-07 ENCOUNTER — Emergency Department (HOSPITAL_BASED_OUTPATIENT_CLINIC_OR_DEPARTMENT_OTHER)
Admission: EM | Admit: 2024-06-07 | Discharge: 2024-06-07 | Discharge status: Against Medical Advice | Attending: Emergency Medicine | Admitting: Emergency Medicine

## 2024-06-07 ENCOUNTER — Other Ambulatory Visit: Payer: Self-pay

## 2024-06-07 DIAGNOSIS — Z5900 Homelessness unspecified: Secondary | ICD-10-CM | POA: Insufficient documentation

## 2024-06-07 DIAGNOSIS — R410 Disorientation, unspecified: Secondary | ICD-10-CM | POA: Insufficient documentation

## 2024-06-07 DIAGNOSIS — M79673 Pain in unspecified foot: Secondary | ICD-10-CM | POA: Diagnosis not present

## 2024-06-07 DIAGNOSIS — Z5321 Procedure and treatment not carried out due to patient leaving prior to being seen by health care provider: Secondary | ICD-10-CM | POA: Diagnosis not present

## 2024-06-07 DIAGNOSIS — F1010 Alcohol abuse, uncomplicated: Secondary | ICD-10-CM | POA: Insufficient documentation

## 2024-06-07 NOTE — ED Notes (Addendum)
 Registration reports that patient left the ED.

## 2024-06-07 NOTE — ED Triage Notes (Addendum)
 Pt to ED from street,  I just need a place to stay. Endorses etoh, refusing blood work.

## 2024-06-29 ENCOUNTER — Emergency Department (HOSPITAL_COMMUNITY)
Admission: EM | Admit: 2024-06-29 | Discharge: 2024-06-30 | Discharge status: Against Medical Advice | Attending: Emergency Medicine | Admitting: Emergency Medicine

## 2024-06-29 ENCOUNTER — Encounter (HOSPITAL_COMMUNITY): Payer: Self-pay | Admitting: Emergency Medicine

## 2024-06-29 ENCOUNTER — Other Ambulatory Visit: Payer: Self-pay

## 2024-06-29 DIAGNOSIS — Z5900 Homelessness unspecified: Secondary | ICD-10-CM | POA: Insufficient documentation

## 2024-06-29 DIAGNOSIS — R4589 Other symptoms and signs involving emotional state: Secondary | ICD-10-CM | POA: Insufficient documentation

## 2024-06-29 DIAGNOSIS — M25579 Pain in unspecified ankle and joints of unspecified foot: Secondary | ICD-10-CM

## 2024-06-29 DIAGNOSIS — Z5329 Procedure and treatment not carried out because of patient's decision for other reasons: Secondary | ICD-10-CM | POA: Insufficient documentation

## 2024-06-29 DIAGNOSIS — R4789 Other speech disturbances: Secondary | ICD-10-CM | POA: Insufficient documentation

## 2024-06-29 DIAGNOSIS — M25571 Pain in right ankle and joints of right foot: Secondary | ICD-10-CM | POA: Insufficient documentation

## 2024-06-29 DIAGNOSIS — F200 Paranoid schizophrenia: Secondary | ICD-10-CM | POA: Diagnosis present
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
## 2024-06-29 NOTE — ED Notes (Addendum)
 Patient left and walked outside and is back now.

## 2024-06-29 NOTE — ED Notes (Signed)
 Pt refusing vital signs.  EDP Nanavati assessed patient
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
## 2024-06-29 NOTE — ED Notes (Signed)
 Patient left in the middle of triage. He was found sitting in the lobby. When asked if he would come back to be seen. He said no and only wanted a warm place to stay.

## 2024-06-29 NOTE — ED Notes (Signed)
 Warm blanket given

## 2024-06-29 NOTE — ED Notes (Signed)
 Attempted to talk to patient and see if he wanted to go back and get a warm blanket. Patient said no thank you. Patient then switched seats in lobby.

## 2024-06-29 NOTE — ED Notes (Signed)
 Pt is getting up and walking to door. EDP talked to him on way out and pt not wanting to stay for treatment.

## 2024-06-30 ENCOUNTER — Emergency Department (HOSPITAL_COMMUNITY)
Admission: EM | Admit: 2024-06-30 | Discharge: 2024-07-01 | Disposition: A | Discharge status: Home / Self Care | Source: Home / Self Care | Attending: Emergency Medicine | Admitting: Emergency Medicine

## 2024-06-30 DIAGNOSIS — F209 Schizophrenia, unspecified: Secondary | ICD-10-CM

## 2024-06-30 DIAGNOSIS — F200 Paranoid schizophrenia: Secondary | ICD-10-CM | POA: Insufficient documentation

## 2024-06-30 DIAGNOSIS — F29 Unspecified psychosis not due to a substance or known physiological condition: Secondary | ICD-10-CM

## 2024-06-30 LAB — ETHANOL: Alcohol, Ethyl (B): <15 mg/dL (ref <15)

## 2024-06-30 LAB — CBC
HCT: 37.9 % — ABNORMAL LOW (ref 39.0–52.0)
Hemoglobin: 11.8 g/dL — ABNORMAL LOW (ref 13.0–17.0)
MCH: 29.3 pg (ref 26.0–34.0)
MCHC: 31.1 g/dL (ref 30.0–36.0)
MCV: 94 fL (ref 80.0–100.0)
Platelets: 229 K/uL (ref 150–400)
RBC: 4.03 MIL/uL — ABNORMAL LOW (ref 4.22–5.81)
RDW: 12.1 % (ref 11.5–15.5)
WBC: 5.5 K/uL (ref 4.0–10.5)
nRBC: 0 % (ref 0.0–0.2)

## 2024-06-30 LAB — COMPREHENSIVE METABOLIC PANEL WITH GFR
ALT: 18 U/L (ref 0–44)
AST: 38 U/L (ref 15–41)
Albumin: 3.9 g/dL (ref 3.5–5.0)
Alkaline Phosphatase: 78 U/L (ref 38–126)
Anion gap: 11 (ref 5–15)
BUN: 12 mg/dL (ref 6–20)
CO2: 27 mmol/L (ref 22–32)
Calcium: 9.5 mg/dL (ref 8.9–10.3)
Chloride: 105 mmol/L (ref 98–111)
Creatinine, Ser: 0.9 mg/dL (ref 0.61–1.24)
GFR, Estimated: >60 mL/min (ref >60)
Glucose, Bld: 163 mg/dL — ABNORMAL HIGH (ref 70–99)
Potassium: 3.6 mmol/L (ref 3.5–5.1)
Sodium: 144 mmol/L (ref 135–145)
Total Bilirubin: 0.3 mg/dL (ref 0.0–1.2)
Total Protein: 6.5 g/dL (ref 6.5–8.1)

## 2024-06-30 NOTE — ED Notes (Signed)
 Pt declined labs or further treatment. Walked from department in nad.  EDP notified.

## 2024-06-30 NOTE — ED Notes (Signed)
 Pt uncooperative. Attempted to get pt into pysch scrubs, pt refused. MD made aware.

## 2024-06-30 NOTE — ED Provider Notes (Signed)
 [place] EMERGENCY DEPARTMENT AT [facility] Provider Note   CSN: [id number] Arrival date & time: 06/30/24  2046     Patient presents with: No chief complaint on file.   [name] is a 53 y.o. male.   Pt with hx schizophrenia, brought by EMS from [facility] where patient was eating as they noted patient was acting odd.  Pt denies specific complaint, but is a very limited historian. Pt denies feeling depressed. Denies any thoughts of harm to self or others. Denies any new or acute physical symptoms or acute illness.   The history is provided by the patient, medical records and the EMS personnel.       Prior to Admission medications   Not on File    Allergies: Amoxicillin , Dextromethorphan -guaifenesin , Haloperidol , Meloxicam, Nsaids, Quetiapine , Sudafed [pseudoephedrine hcl], Ziprasidone , and Prednisone    Review of Systems  Constitutional:  Negative for fever.  HENT:  Negative for sore throat.   Respiratory:  Negative for cough and shortness of breath.   Cardiovascular:  Negative for chest pain.  Gastrointestinal:  Negative for abdominal pain, nausea and vomiting.  Genitourinary:  Negative for flank pain.  Musculoskeletal:  Negative for back pain and neck pain.  Neurological:  Negative for headaches.  Psychiatric/Behavioral:  Negative for self-injury and suicidal ideas.     Updated Vital Signs BP (!) 118/90 (BP Location: Left Arm)   Pulse 79   Temp 98.8 F (37.1 C) (Oral)   Resp 18   SpO2 100%   Physical Exam Vitals and nursing note reviewed.  Constitutional:      Appearance: Normal appearance. He is well-developed.  HENT:     Head: Atraumatic.     Nose: Nose normal.     Mouth/Throat:     Mouth: Mucous membranes are moist.     Pharynx: Oropharynx is clear.  Eyes:     General: No scleral icterus.    Conjunctiva/sclera: Conjunctivae normal.     Pupils: Pupils are equal, round, and reactive to light.  Neck:     Trachea: No tracheal  deviation.     Comments: No stiffness or rigidity.  Cardiovascular:     Rate and Rhythm: Normal rate and regular rhythm.     Pulses: Normal pulses.     Heart sounds: Normal heart sounds. No murmur heard.    No friction rub. No gallop.  Pulmonary:     Effort: Pulmonary effort is normal. No accessory muscle usage or respiratory distress.     Breath sounds: Normal breath sounds.  Abdominal:     General: Bowel sounds are normal. There is no distension.     Palpations: Abdomen is soft.     Tenderness: There is no abdominal tenderness.  Musculoskeletal:        General: No swelling or tenderness.     Cervical back: Normal range of motion and neck supple. No rigidity.  Skin:    General: Skin is warm and dry.     Findings: No rash.  Neurological:     Mental Status: He is alert.     Comments: Alert, speech clear. Motor/sens grossly intact bil.   Psychiatric:     Comments: Normal mood/affect. Pt does not appear to be acutely depressed. Denies thoughts of harm to self or others.      (all labs ordered are listed, but only abnormal results are displayed) Results for orders placed or performed during the hospital encounter of 06/30/24  Comprehensive metabolic panel with GFR  Collection Time: 06/30/24  9:38 PM  Result Value Ref Range   Sodium 144 135 - 145 mmol/L   Potassium 3.6 3.5 - 5.1 mmol/L   Chloride 105 98 - 111 mmol/L   CO2 27 22 - 32 mmol/L   Glucose, Bld 163 (H) 70 - 99 mg/dL   BUN 12 6 - 20 mg/dL   Creatinine, Ser 9.09 0.61 - 1.24 mg/dL   Calcium 9.5 8.9 - 89.6 mg/dL   Total Protein 6.5 6.5 - 8.1 g/dL   Albumin 3.9 3.5 - 5.0 g/dL   AST 38 15 - 41 U/L   ALT 18 0 - 44 U/L   Alkaline Phosphatase 78 38 - 126 U/L   Total Bilirubin 0.3 0.0 - 1.2 mg/dL   GFR, Estimated >39 >39 mL/min   Anion gap 11 5 - 15  CBC   Collection Time: 06/30/24  9:38 PM  Result Value Ref Range   WBC 5.5 4.0 - 10.5 K/uL   RBC 4.03 (L) 4.22 - 5.81 MIL/uL   Hemoglobin 11.8 (L) 13.0 - 17.0 g/dL    HCT 62.0 (L) 60.9 - 52.0 %   MCV 94.0 80.0 - 100.0 fL   MCH 29.3 26.0 - 34.0 pg   MCHC 31.1 30.0 - 36.0 g/dL   RDW 87.8 88.4 - 84.4 %   Platelets 229 150 - 400 K/uL   nRBC 0.0 0.0 - 0.2 %  Ethanol   Collection Time: 06/30/24  9:38 PM  Result Value Ref Range   Alcohol, Ethyl (B) <15 <15 mg/dL      EKG: None  Radiology: No results found.   Procedures   Medications Ordered in the ED - No data to display                                  Medical Decision Making Problems Addressed: Psychosis, unspecified psychosis type [facility]): acute illness or injury with systemic symptoms Schizophrenia, unspecified type [facility]): chronic illness or injury with exacerbation, progression, or side effects of treatment that poses a threat to life or bodily functions  Amount and/or Complexity of Data Reviewed Independent Historian: EMS    Details: hx External Data Reviewed: notes. Labs: ordered. Decision-making details documented in ED Course.  Risk Prescription drug management. Decision regarding hospitalization.   Labs ordered/sent.  Differential diagnosis includes dehydration, non compliance w meds, SUD, etc. Dispo decision including potential need for admission considered - will get labs and reassess.   Reviewed nursing notes and prior charts for additional history. External reports reviewed. Additional history from: EMS.   Labs reviewed/interpreted by me - wbc 5, hgb 12. Chem largely unremarkable, glucose mildly elevated.   Recheck, at times pt does appear to be responding to internal stimuli, having conversation with another not present. BH eval is pending.   The patient has been placed in psychiatric observation due to the need to provide a safe environment for the patient while obtaining psychiatric consultation and evaluation, as well as ongoing medical and medication management to treat the patient's condition.    BH eval is pending - disposition per [facility] team.   Med rec  pending.  Pt signed out to oncoming EDP.          Final diagnoses:  None    ED Discharge Orders     None          Bernard Drivers, MD 06/30/24 2231

## 2024-06-30 NOTE — ED Triage Notes (Signed)
 Patient talking to himself at triage.

## 2024-06-30 NOTE — ED Triage Notes (Signed)
 Patient BIB EMS from [facility]. Employee from [employer] called EMS due to patient not acting right and slurring his speech. Per EMS patient denies pain and states he is fine. Patient states at triage that he was sitting at [facility] and a lady came up to him and ask if he was ok and because he did not talk she called EMS. Patient denies any issues other than his right ankle hurting. Patient states he does not rate his pain. Patient talking and alert at triage. Alert to person, place and time.

## 2024-06-30 NOTE — BH Assessment (Signed)
 Clinician messaged Harland Sauers, RN and Lum MATSU. Germaine, RN: Walterine. It's Trey with TTS. Is the pt able to engage in the assessment, if so the pt will need to be placed in a private room. Is the pt under IVC? Also is the pt medically cleared?   Clinician awaiting response.    Jackson JONETTA Broach, MS, [facility], [facility] Triage Specialist [phone]

## 2024-07-01 NOTE — ED Provider Notes (Signed)
  Provider Note MRN:  [id number]  Arrival date & time: 07/01/24    ED Course and Medical Decision Making  Assumed care of patient at sign-out or upon transfer.  History of schizophrenia, there was concern that patient was talking to himself earlier which triggered the TTS evaluation.  TTS recommending inpatient treatment.  However on my exam patient is not exhibiting any signs of responding to internal stimuli.  He simply states that he wants a warm place to sleep tonight.  I suspect he does have chronic psychiatric disturbance but he is denying SI or HI or AVH, he does not seem to be an immediate threat of harm to self or others.  We made several attempts to have patient change out so that he can safely go to the behavioral health area of the emergency department.  He continues to refuse to do so, not willing to be away from his belongings.  Overall I do feel that inpatient management would likely be best but I do not feel that sedation and IVC is appropriate or necessary at this time in order to accomplish this.  Despite multiple attempts at reasoning with the patient still he refuses, and so we will discharge patient with instructions to follow-up in the outpatient setting.  Procedures  Final Clinical Impressions(s) / ED Diagnoses     ICD-10-CM   1. Schizophrenia, unspecified type (HCC)  F20.9       ED Discharge Orders     None       Discharge Instructions   None     Ozell HERO. Theadore, MD [facility] Emergency Medicine [facility] [email or web address]    Theadore Ozell HERO, MD 07/01/24 [phone]

## 2024-07-01 NOTE — ED Notes (Signed)
 Pt refused to follow policies/ procedures and requested to leave. DC orders placed by MD.

## 2024-07-01 NOTE — ED Notes (Signed)
 Inpatient Tx recommended.SABRA attempted to get pt into scrubs again and pt refused. PA notified and then also tried to get pt into scrubs and was unable to.
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
## 2024-07-05 ENCOUNTER — Emergency Department (HOSPITAL_COMMUNITY)
Admission: EM | Admit: 2024-07-05 | Discharge: 2024-07-05 | Disposition: A | Discharge status: Home / Self Care | Attending: Emergency Medicine | Admitting: Emergency Medicine

## 2024-07-05 ENCOUNTER — Emergency Department (HOSPITAL_COMMUNITY)

## 2024-07-05 ENCOUNTER — Other Ambulatory Visit: Payer: Self-pay

## 2024-07-05 ENCOUNTER — Encounter (HOSPITAL_COMMUNITY): Payer: Self-pay

## 2024-07-05 DIAGNOSIS — Z59819 Housing instability, housed unspecified: Secondary | ICD-10-CM | POA: Insufficient documentation

## 2024-07-05 DIAGNOSIS — M25571 Pain in right ankle and joints of right foot: Secondary | ICD-10-CM | POA: Diagnosis present

## 2024-07-05 MED ORDER — ACETAMINOPHEN 500 MG PO TABS
1000.0000 mg | ORAL_TABLET | Freq: Once | ORAL | Status: DC
  Filled 2024-07-05: qty 2

## 2024-07-05 NOTE — ED Notes (Signed)
 Social worker provided patient with 2 pairs of dry socks.

## 2024-07-05 NOTE — ED Notes (Signed)
 Pt provided shoes by child psychotherapist.
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
## 2024-07-05 NOTE — ED Notes (Signed)
Pt provided sandwich bag

## 2024-07-05 NOTE — ED Notes (Signed)
 Pt refused vital signs and temperature assessment upon discharge. Pt also refused to take the shoes that social work provided for him. Pt said get that out of my face when attempted to assess temperature.
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
## 2024-07-05 NOTE — TOC CM/SW Note (Signed)
 New socks delivered to bedside.

## 2024-07-05 NOTE — ED Notes (Signed)
 Pt provided discharge paperwork. All belongings returned to pt. Pt assisted to front door via wheel chair.

## 2024-07-05 NOTE — Discharge Instructions (Signed)
 It was our pleasure to provide your ER care today - we hope that you feel better.  Keep feet, socks and shoes as clean and dry as possible.   Take acetaminophen  as need.   See resources attached in terms of shelters and other social service resources in the community.   Return to ER if worse, new symptoms, fevers, increased swelling/spreading redness, severe pain, or other concern.

## 2024-10-07 ENCOUNTER — Emergency Department (HOSPITAL_COMMUNITY)
Admission: EM | Admit: 2024-10-07 | Discharge: 2024-10-07 | Disposition: A | Discharge status: Home / Self Care | Attending: Emergency Medicine | Admitting: Emergency Medicine

## 2024-10-07 ENCOUNTER — Encounter (HOSPITAL_COMMUNITY): Payer: Self-pay | Admitting: Psychiatry

## 2024-10-07 DIAGNOSIS — R443 Hallucinations, unspecified: Secondary | ICD-10-CM | POA: Diagnosis not present

## 2024-10-07 DIAGNOSIS — R4182 Altered mental status, unspecified: Secondary | ICD-10-CM | POA: Insufficient documentation

## 2024-10-07 DIAGNOSIS — F29 Unspecified psychosis not due to a substance or known physiological condition: Secondary | ICD-10-CM | POA: Diagnosis present

## 2024-10-07 LAB — COMPREHENSIVE METABOLIC PANEL WITH GFR
ALT: 29 U/L (ref 0–44)
AST: 25 U/L (ref 15–41)
Albumin: 4.6 g/dL (ref 3.5–5.0)
Alkaline Phosphatase: 62 U/L (ref 38–126)
Anion gap: 10 (ref 5–15)
BUN: 13 mg/dL (ref 6–20)
CO2: 29 mmol/L (ref 22–32)
Calcium: 9.7 mg/dL (ref 8.9–10.3)
Chloride: 106 mmol/L (ref 98–111)
Creatinine, Ser: 0.76 mg/dL (ref 0.61–1.24)
GFR, Estimated: >60 mL/min
Glucose, Bld: 92 mg/dL (ref 70–99)
Potassium: 4 mmol/L (ref 3.5–5.1)
Sodium: 145 mmol/L (ref 135–145)
Total Bilirubin: 0.5 mg/dL (ref 0.0–1.2)
Total Protein: 7.3 g/dL (ref 6.5–8.1)

## 2024-10-07 LAB — CBC WITH DIFFERENTIAL/PLATELET
Abs Immature Granulocytes: 0.01 10*3/uL (ref 0.00–0.07)
Basophils Absolute: 0.1 10*3/uL (ref 0.0–0.1)
Basophils Relative: 1 %
Eosinophils Absolute: 0 10*3/uL (ref 0.0–0.5)
Eosinophils Relative: 1 %
HCT: 41.6 % (ref 39.0–52.0)
Hemoglobin: 13.7 g/dL (ref 13.0–17.0)
Immature Granulocytes: 0 %
Lymphocytes Relative: 28 %
Lymphs Abs: 1.4 10*3/uL (ref 0.7–4.0)
MCH: 29.6 pg (ref 26.0–34.0)
MCHC: 32.9 g/dL (ref 30.0–36.0)
MCV: 89.8 fL (ref 80.0–100.0)
Monocytes Absolute: 0.4 10*3/uL (ref 0.1–1.0)
Monocytes Relative: 8 %
Neutro Abs: 3 10*3/uL (ref 1.7–7.7)
Neutrophils Relative %: 62 %
Platelets: 158 10*3/uL (ref 150–400)
RBC: 4.63 MIL/uL (ref 4.22–5.81)
RDW: 13.6 % (ref 11.5–15.5)
WBC: 4.9 10*3/uL (ref 4.0–10.5)
nRBC: 0 % (ref 0.0–0.2)

## 2024-10-07 LAB — ACETAMINOPHEN LEVEL: Acetaminophen (Tylenol), Serum: <10 ug/mL — ABNORMAL LOW (ref 10–30)

## 2024-10-07 LAB — ETHANOL: Alcohol, Ethyl (B): <15 mg/dL

## 2024-10-07 LAB — SALICYLATE LEVEL: Salicylate Lvl: <7 mg/dL — ABNORMAL LOW (ref 7.0–30.0)

## 2024-10-07 NOTE — BH Assessment (Signed)
 Clinician messaged Izetta Amber, RN: Walterine. It's Trey with TTS. Is the pt able to engage in the assessment, if so the pt will need to be placed in a private room. Is the pt under IVC? Also is the pt medically cleared?   Clinician awaiting response.    Jackson JONETTA Broach, MS, [facility], [facility] Triage Specialist [phone]
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
## 2024-10-07 NOTE — BH Assessment (Signed)
 Clinician attempted to engage pt in TTS assessment however he would not make eye contact speak or speak with clinician. Clinician messaged John Coombes, RN the pt is not engaging. Clinician observed pt's RN encouraging the pt to engage however he continued to refuse. Clinician expressed to pt's nurse pt message her once the pt is able to engage and she will assess him.    Jackson JONETTA Broach, MS, [facility], [facility] Triage Specialist [phone]

## 2024-10-07 NOTE — ED Notes (Addendum)
Patient refused oral temperature.  

## 2024-10-07 NOTE — ED Provider Notes (Signed)
 " [place] EMERGENCY DEPARTMENT AT [facility] Provider Note   CSN: [id number] Arrival date & time: 10/07/24  9941     Patient presents with: not eating    Arie A Weissberg is a 54 y.o. male.   Patient BIB [facility] stating altered mental status. Patient with a history of schizophrenia, psychosis reported to be actively hallucinating, increased agitation, refusing to eat. Per report provided by sheriff, he was recently started on Zyprexa  without change. Requesting ED evaluation for altered mental status. The patient denies hallucinations, does not offer insight as to mental or physical complaints.   The history is provided by the patient and the police. No language interpreter was used.       Prior to Admission medications  Medication Sig Start Date End Date Taking? Authorizing Provider  lactose free nutrition (BOOST) LIQD Take 237 mLs by mouth 3 (three) times daily between meals.   Yes [provider]  OLANZapine  (ZYPREXA ) 10 MG tablet Take 10 mg by mouth at bedtime. Patient not taking: Reported on 10/07/2024    [provider]    Allergies: Amoxicillin , Dextromethorphan -guaifenesin , Haloperidol , Meloxicam, Nsaids, Quetiapine , Sudafed [pseudoephedrine hcl], Ziprasidone , and Prednisone    Review of Systems  Updated Vital Signs BP 139/87   Pulse (!) 46   Temp 97.8 F (36.6 C) (Oral)   Resp 16   SpO2 100%   Physical Exam Vitals and nursing note reviewed.  Constitutional:      Appearance: He is well-developed. He is not ill-appearing.  HENT:     Head: Normocephalic.  Cardiovascular:     Rate and Rhythm: Normal rate.  Pulmonary:     Effort: Pulmonary effort is normal.  Abdominal:     General: There is no distension.  Musculoskeletal:        General: Normal range of motion.     Cervical back: Normal range of motion and neck supple.  Skin:    General: Skin is warm and dry.  Neurological:     General: No focal deficit present.      Mental Status: He is alert and oriented to person, place, and time.  Psychiatric:        Mood and Affect: Affect is flat.        Speech: He is noncommunicative.        Behavior: Behavior is cooperative.     Comments: Denies SI but reports to RN I want to be in a tomb.      (all labs ordered are listed, but only abnormal results are displayed) Labs Reviewed  SALICYLATE LEVEL - Abnormal; Notable for the following components:      Result Value   Salicylate Lvl <7.0 (*)    All other components within normal limits  ACETAMINOPHEN  LEVEL - Abnormal; Notable for the following components:   Acetaminophen  (Tylenol ), Serum <10 (*)    All other components within normal limits  CBC WITH DIFFERENTIAL/PLATELET  ETHANOL  COMPREHENSIVE METABOLIC PANEL WITH GFR  URINE DRUG SCREEN    EKG: None  Radiology: No results found.   Procedures   Medications Ordered in the ED - No data to display  Clinical Course as of 10/07/24 9367  [place] Oct 07, 2024  0146 Patient with h/o schizophrenia here from jail with report of not eating, hallucinating, agitation despite Zyprexa . Reports I want to be in a tomb but then denies SI. VSS.  [SU]  [id number] Patient is considered medically cleared for psychiatric evaluation. [SU]  Clinical Course User Index [SU] Odell Balls, PA-C                                 Medical Decision Making Amount and/or Complexity of Data Reviewed Labs: ordered.        Final diagnoses:  Hallucinations    ED Discharge Orders     None          Odell Balls, [place] 10/07/24 9367  "

## 2024-10-07 NOTE — Discharge Instructions (Addendum)
-   Recommend return to safe and secure environment at jail - Recommend continued care at jail by in-house psychiatry team - Recommend safety precautions by jail psychiatry team in house

## 2024-10-07 NOTE — ED Triage Notes (Addendum)
 Pt BIB [facility] from jail due to not eating and drinking x1 week. Per EMS the doctor at jail was concerned for malnutrition. In triage patient was not compliant with questions, answering no for each question. Pt states I want to be in a tomb.   121 systolic BP  50 HR  98% sp02 room air

## 2024-10-07 NOTE — ED Provider Notes (Signed)
 Emergency Medicine Observation Re-evaluation Note  [name] is a 54 y.o. male, seen on rounds today.  Pt initially presented to the ED for complaints of not eating  Currently, the patient is resting comfortably.  Physical Exam  BP 126/76 (BP Location: Right Arm)   Pulse (!) 47   Temp 97.8 F (36.6 C) (Oral)   Resp 14   SpO2 99%  Physical Exam General: No acute distress Cardiac: Well-perfused Lungs: Nonlabored Psych: Calm  ED Course / MDM  EKG:   I have reviewed the labs performed to date as well as medications administered while in observation.  Recent changes in the last 24 hours include psychiatric evaluation.  They deemed patient psychiatrically cleared to return back to jail..  Plan  Current plan is for discharge to jail.    [name] Ozell BROCKS, MD 10/07/24 [phone]
